# Patient Record
Sex: Female | Born: 1955
Health system: Southern US, Community
[De-identification: ages and names within clinical notes are randomized; demographics above are authoritative.]

## PROBLEM LIST (undated history)

## (undated) DIAGNOSIS — I10 Essential (primary) hypertension: Secondary | ICD-10-CM

## (undated) DIAGNOSIS — R200 Anesthesia of skin: Secondary | ICD-10-CM

## (undated) DIAGNOSIS — L03119 Cellulitis of unspecified part of limb: Secondary | ICD-10-CM

## (undated) DIAGNOSIS — L02619 Cutaneous abscess of unspecified foot: Secondary | ICD-10-CM

## (undated) DIAGNOSIS — E785 Hyperlipidemia, unspecified: Secondary | ICD-10-CM

## (undated) DIAGNOSIS — E1140 Type 2 diabetes mellitus with diabetic neuropathy, unspecified: Secondary | ICD-10-CM

## (undated) DIAGNOSIS — D508 Other iron deficiency anemias: Secondary | ICD-10-CM

## (undated) DIAGNOSIS — F319 Bipolar disorder, unspecified: Secondary | ICD-10-CM

## (undated) DIAGNOSIS — R202 Paresthesia of skin: Secondary | ICD-10-CM

## (undated) DIAGNOSIS — Z8719 Personal history of other diseases of the digestive system: Secondary | ICD-10-CM

## (undated) DIAGNOSIS — D631 Anemia in chronic kidney disease: Secondary | ICD-10-CM

## (undated) DIAGNOSIS — K909 Intestinal malabsorption, unspecified: Secondary | ICD-10-CM

## (undated) HISTORY — PX: COLON SURGERY: SHX602

## (undated) HISTORY — DX: Intestinal malabsorption, unspecified: K90.9

## (undated) HISTORY — PX: ADENOIDECTOMY: SUR15

## (undated) HISTORY — PX: ABDOMINAL HYSTERECTOMY: SHX81

## (undated) HISTORY — PX: CARPAL TUNNEL RELEASE: SHX101

## (undated) HISTORY — DX: Hyperlipidemia, unspecified: E78.5

## (undated) HISTORY — PX: BREAST SURGERY: SHX581

## (undated) HISTORY — PX: BLADDER SURGERY: SHX569

## (undated) HISTORY — PX: TONSILLECTOMY: SUR1361

## (undated) HISTORY — DX: Anemia in chronic kidney disease: D63.1

## (undated) HISTORY — DX: Other iron deficiency anemias: D50.8

---
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
## 1997-10-13 ENCOUNTER — Ambulatory Visit (HOSPITAL_COMMUNITY): Admission: RE | Admit: 1997-10-13 | Discharge: 1997-10-13 | Payer: Self-pay | Admitting: Pulmonary Disease

## 1997-10-20 ENCOUNTER — Ambulatory Visit: Admission: RE | Admit: 1997-10-20 | Discharge: 1997-10-20 | Payer: Self-pay | Admitting: Pulmonary Disease

## 1997-11-07 ENCOUNTER — Ambulatory Visit: Admission: RE | Admit: 1997-11-07 | Discharge: 1997-11-07 | Payer: Self-pay | Admitting: Pulmonary Disease

## 1998-01-12 ENCOUNTER — Ambulatory Visit (HOSPITAL_COMMUNITY): Admission: RE | Admit: 1998-01-12 | Discharge: 1998-01-12 | Payer: Self-pay | Admitting: Pulmonary Disease

## 1999-12-28 ENCOUNTER — Emergency Department (HOSPITAL_COMMUNITY): Admission: EM | Admit: 1999-12-28 | Discharge: 1999-12-28 | Payer: Self-pay | Admitting: Emergency Medicine

## 1999-12-29 ENCOUNTER — Encounter: Payer: Self-pay | Admitting: Emergency Medicine

## 2000-05-12 HISTORY — PX: OTHER SURGICAL HISTORY: SHX169

## 2011-10-23 ENCOUNTER — Inpatient Hospital Stay (HOSPITAL_COMMUNITY)
Admission: EM | Admit: 2011-10-23 | Discharge: 2011-10-28 | DRG: 504 | Disposition: A | Discharge status: Home / Self Care | Payer: Self-pay | Attending: Internal Medicine | Admitting: Internal Medicine

## 2011-10-23 ENCOUNTER — Emergency Department (HOSPITAL_COMMUNITY): Payer: Self-pay

## 2011-10-23 ENCOUNTER — Encounter (HOSPITAL_COMMUNITY): Payer: Self-pay | Admitting: *Deleted

## 2011-10-23 DIAGNOSIS — L03119 Cellulitis of unspecified part of limb: Secondary | ICD-10-CM | POA: Diagnosis present

## 2011-10-23 DIAGNOSIS — L0291 Cutaneous abscess, unspecified: Secondary | ICD-10-CM

## 2011-10-23 DIAGNOSIS — F319 Bipolar disorder, unspecified: Secondary | ICD-10-CM | POA: Diagnosis present

## 2011-10-23 DIAGNOSIS — Z79899 Other long term (current) drug therapy: Secondary | ICD-10-CM

## 2011-10-23 DIAGNOSIS — E119 Type 2 diabetes mellitus without complications: Secondary | ICD-10-CM | POA: Diagnosis present

## 2011-10-23 DIAGNOSIS — E1142 Type 2 diabetes mellitus with diabetic polyneuropathy: Secondary | ICD-10-CM | POA: Diagnosis present

## 2011-10-23 DIAGNOSIS — B9689 Other specified bacterial agents as the cause of diseases classified elsewhere: Secondary | ICD-10-CM | POA: Diagnosis present

## 2011-10-23 DIAGNOSIS — Z9889 Other specified postprocedural states: Secondary | ICD-10-CM

## 2011-10-23 DIAGNOSIS — D638 Anemia in other chronic diseases classified elsewhere: Secondary | ICD-10-CM | POA: Diagnosis present

## 2011-10-23 DIAGNOSIS — E871 Hypo-osmolality and hyponatremia: Secondary | ICD-10-CM | POA: Diagnosis present

## 2011-10-23 DIAGNOSIS — M869 Osteomyelitis, unspecified: Principal | ICD-10-CM | POA: Diagnosis present

## 2011-10-23 DIAGNOSIS — B952 Enterococcus as the cause of diseases classified elsewhere: Secondary | ICD-10-CM | POA: Diagnosis present

## 2011-10-23 DIAGNOSIS — Z91040 Latex allergy status: Secondary | ICD-10-CM

## 2011-10-23 DIAGNOSIS — Z9079 Acquired absence of other genital organ(s): Secondary | ICD-10-CM

## 2011-10-23 DIAGNOSIS — D509 Iron deficiency anemia, unspecified: Secondary | ICD-10-CM | POA: Diagnosis present

## 2011-10-23 DIAGNOSIS — L02619 Cutaneous abscess of unspecified foot: Secondary | ICD-10-CM | POA: Diagnosis present

## 2011-10-23 DIAGNOSIS — L03039 Cellulitis of unspecified toe: Secondary | ICD-10-CM | POA: Diagnosis present

## 2011-10-23 DIAGNOSIS — Z885 Allergy status to narcotic agent status: Secondary | ICD-10-CM

## 2011-10-23 HISTORY — DX: Essential (primary) hypertension: I10

## 2011-10-23 HISTORY — DX: Bipolar disorder, unspecified: F31.9

## 2011-10-23 LAB — CBC
HCT: 34.1 % — ABNORMAL LOW (ref 36.0–46.0)
Hemoglobin: 11.8 g/dL — ABNORMAL LOW (ref 12.0–15.0)
MCH: 28.8 pg (ref 26.0–34.0)
MCHC: 34.6 g/dL (ref 30.0–36.0)
MCV: 83.2 fL (ref 78.0–100.0)
Platelets: 432 10*3/uL — ABNORMAL HIGH (ref 150–400)
RBC: 4.1 MIL/uL (ref 3.87–5.11)
RDW: 12.4 % (ref 11.5–15.5)
WBC: 11.5 10*3/uL — ABNORMAL HIGH (ref 4.0–10.5)

## 2011-10-23 LAB — DIFFERENTIAL
Basophils Absolute: 0.1 10*3/uL (ref 0.0–0.1)
Basophils Relative: 1 % (ref 0–1)
Eosinophils Relative: 1 % (ref 0–5)
Monocytes Absolute: 1.2 10*3/uL — ABNORMAL HIGH (ref 0.1–1.0)

## 2011-10-23 LAB — BASIC METABOLIC PANEL
BUN: 10 mg/dL (ref 6–23)
Creatinine, Ser: 0.52 mg/dL (ref 0.50–1.10)
GFR calc Af Amer: >90 mL/min (ref >90)
GFR calc non Af Amer: >90 mL/min (ref >90)

## 2011-10-23 MED ORDER — PIPERACILLIN-TAZOBACTAM 4.5 G IVPB: 4.5000 g | Freq: Once | INTRAVENOUS | Status: DC

## 2011-10-23 MED ORDER — MORPHINE SULFATE 2 MG/ML IJ SOLN
2.0000 mg | Freq: Once | INTRAMUSCULAR | Status: AC
  Administered 2011-10-23: 2 mg via INTRAVENOUS
  Filled 2011-10-23: qty 1

## 2011-10-23 MED ORDER — PIPERACILLIN-TAZOBACTAM 3.375 G IVPB 30 MIN
3.3750 g | Freq: Once | INTRAVENOUS | Status: AC
  Administered 2011-10-23: 3.375 g via INTRAVENOUS
  Filled 2011-10-23: qty 50

## 2011-10-23 MED ORDER — SODIUM CHLORIDE 0.9 % IV SOLN
INTRAVENOUS | Status: AC
  Administered 2011-10-23: via INTRAVENOUS

## 2011-10-23 MED ORDER — VANCOMYCIN HCL IN DEXTROSE 1-5 GM/200ML-% IV SOLN
1000.0000 mg | Freq: Once | INTRAVENOUS | Status: AC
  Administered 2011-10-23: 1000 mg via INTRAVENOUS
  Filled 2011-10-23: qty 200

## 2011-10-23 NOTE — ED Notes (Signed)
To ED for further eval of right foot pain and infection. States she had a spider bite and was seen and treated at [facility]. Pt states she has been an outpt at [facility] for vancomycin. Pt states she is now vomiting. Foot is foul smelling.

## 2011-10-23 NOTE — ED Notes (Signed)
Pt. Resting and call light within reach.

## 2011-10-23 NOTE — ED Provider Notes (Signed)
History     CSN: [id number]  Arrival date & time 10/23/11  1813   None     Chief Complaint  Patient presents with  . Foot Pain    (Consider location/radiation/quality/duration/timing/severity/associated sxs/prior treatment) HPI  56 year old diabetic presenting today with a three-week history of right foot pain swelling. Small lesion on foot 3 weeks ago developed into cellulitis. Treated by several practitioners, Bactrim, vancomycin, and Keflex. Patient's symptoms gotten worse over the last 3 days and there is no foul odor. The redness and swelling have worsened. She went to a podiatrist today who has been following her she requested that she come to the emergency department. She denies any fever, she is apparently been vomiting. On arrival her vital signs were normal. Past Medical History  Diagnosis Date  . Diabetes mellitus   . Bipolar 1 disorder     Past Surgical History  Procedure Date  . Breast surgery   . Abdominal hysterectomy   . Colon surgery     History reviewed. No pertinent family history.  History  Substance Use Topics  . Smoking status: Never Smoker   . Smokeless tobacco: Not on file  . Alcohol Use: No    OB History    Grav Para Term Preterm Abortions TAB SAB Ect Mult Living                  Review of Systems Constitutional: Negative for fever and chills.  HENT: Negative for ear pain, sore throat and trouble swallowing.   Eyes: Negative for pain and visual disturbance.  Respiratory: Negative for cough and shortness of breath.   Cardiovascular: Negative for chest pain and leg swelling.  Gastrointestinal: Negative for nausea, POS vomiting, neg abdominal pain and diarrhea.  Genitourinary: Negative for dysuria, urgency and frequency.  Musculoskeletal: Negative for back pain and joint swelling.  Skin: Negative for rash and POS wound.  Neurological: Negative for dizziness, syncope, speech difficulty, weakness and numbness.   Allergies  Ativan;  Darvocet; Demerol; Latex; and Oxycodone  Home Medications   Current Outpatient Rx  Name Route Sig Dispense Refill  . LITHIUM CARBONATE 300 MG PO CAPS Oral Take 300-600 mg by mouth 3 (three) times daily with meals. Takes 2 in the am, 1 at dinner & 2 at night    . VENLAFAXINE HCL 37.5 MG PO TABS Oral Take 37.5 mg by mouth 2 (two) times daily.      BP 113/70  Pulse 97  Temp 99.6 F (37.6 C) (Oral)  Resp 20  SpO2 97%  Physical Exam Consitutional: Pt in no acute distress.   Head: Normocephalic and atraumatic.  Eyes: Extraocular motion intact, no scleral icterus Neck: Supple without meningismus, mass, or overt JVD Respiratory: Effort normal and breath sounds normal. No respiratory distress. CV: Heart regular rate and regular rhythm (sinus), no obvious murmurs.  Pulses +2 and symmetric Abdomen: Soft, non-tender, non-distended. No rebound or guarding.  MSK: Right foot moderate to severe edema. Significant erythema on the dorsal foot. Ulceration on anterior volarr foot.  Otherwise extremities are atraumatic without deformity, ROM intact. No crepitus noted Skin: Warm, dry, intact Neuro: Alert and oriented, no motor deficit noted.   Psychiatric: Mood and affect are normal    ED Course  Procedures (including critical care time)   Labs Reviewed  C-REACTIVE PROTEIN  CBC  DIFFERENTIAL  SEDIMENTATION RATE  BASIC METABOLIC PANEL   No results found.   No diagnosis found.    MDM  Diabetic not currently on insulin  secondary to monetary concerns presents with right foot cellulitis x3 weeks. Concern for osteomyelitis. We'll image and start antibiotics. Imaging and blood work suggest osteomyelitis. Vancomycin,  Zosyn started in the emergency department.  Patient remained hemodynamically stable.  X-ray mentions gas in foot, discussed with orthopedics.  Orthopedics will see patient in the morning.  Overall picture not consistent with necrotizing fasciitis.  Patient given pain medication as  well as fluid bolus. Admitted to medicine uneventfully.          Edwin Cap, MD 10/24/11 [phone]

## 2011-10-24 ENCOUNTER — Other Ambulatory Visit (HOSPITAL_COMMUNITY): Payer: Self-pay | Admitting: Orthopedic Surgery

## 2011-10-24 ENCOUNTER — Encounter (HOSPITAL_COMMUNITY): Payer: Self-pay | Admitting: Internal Medicine

## 2011-10-24 ENCOUNTER — Encounter (HOSPITAL_COMMUNITY)
Admission: EM | Disposition: A | Discharge status: Home / Self Care | Payer: Self-pay | Source: Home / Self Care | Attending: Internal Medicine

## 2011-10-24 ENCOUNTER — Inpatient Hospital Stay (HOSPITAL_COMMUNITY): Payer: Self-pay | Admitting: Certified Registered"

## 2011-10-24 ENCOUNTER — Encounter (HOSPITAL_COMMUNITY): Payer: Self-pay | Admitting: Certified Registered"

## 2011-10-24 ENCOUNTER — Inpatient Hospital Stay (HOSPITAL_COMMUNITY): Payer: Self-pay

## 2011-10-24 ENCOUNTER — Encounter (HOSPITAL_COMMUNITY): Payer: Self-pay | Admitting: *Deleted

## 2011-10-24 DIAGNOSIS — Z8659 Personal history of other mental and behavioral disorders: Secondary | ICD-10-CM

## 2011-10-24 DIAGNOSIS — E1165 Type 2 diabetes mellitus with hyperglycemia: Secondary | ICD-10-CM

## 2011-10-24 DIAGNOSIS — E1142 Type 2 diabetes mellitus with diabetic polyneuropathy: Secondary | ICD-10-CM | POA: Diagnosis present

## 2011-10-24 DIAGNOSIS — F319 Bipolar disorder, unspecified: Secondary | ICD-10-CM | POA: Diagnosis present

## 2011-10-24 DIAGNOSIS — L02419 Cutaneous abscess of limb, unspecified: Secondary | ICD-10-CM

## 2011-10-24 DIAGNOSIS — L03119 Cellulitis of unspecified part of limb: Secondary | ICD-10-CM | POA: Diagnosis present

## 2011-10-24 DIAGNOSIS — E119 Type 2 diabetes mellitus without complications: Secondary | ICD-10-CM | POA: Diagnosis present

## 2011-10-24 DIAGNOSIS — Z794 Long term (current) use of insulin: Secondary | ICD-10-CM | POA: Diagnosis present

## 2011-10-24 DIAGNOSIS — IMO0001 Reserved for inherently not codable concepts without codable children: Secondary | ICD-10-CM

## 2011-10-24 HISTORY — PX: AMPUTATION: SHX166

## 2011-10-24 LAB — DIFFERENTIAL
Eosinophils Absolute: 0.2 10*3/uL (ref 0.0–0.7)
Lymphocytes Relative: 18 % (ref 12–46)
Lymphs Abs: 1.5 10*3/uL (ref 0.7–4.0)
Monocytes Relative: 12 % (ref 3–12)
Neutro Abs: 5.6 10*3/uL (ref 1.7–7.7)
Neutrophils Relative %: 67 % (ref 43–77)

## 2011-10-24 LAB — GLUCOSE, CAPILLARY
Glucose-Capillary: 366 mg/dL — ABNORMAL HIGH (ref 70–99)
Glucose-Capillary: 289 mg/dL — ABNORMAL HIGH (ref 70–99)

## 2011-10-24 LAB — COMPREHENSIVE METABOLIC PANEL
AST: 11 U/L (ref 0–37)
BUN: 9 mg/dL (ref 6–23)
CO2: 27 mEq/L (ref 19–32)
Calcium: 8.8 mg/dL (ref 8.4–10.5)
Creatinine, Ser: 0.47 mg/dL — ABNORMAL LOW (ref 0.50–1.10)
GFR calc non Af Amer: >90 mL/min (ref >90)

## 2011-10-24 LAB — RETICULOCYTES
RBC.: 3.76 MIL/uL — ABNORMAL LOW (ref 3.87–5.11)
Retic Ct Pct: 1.8 % (ref 0.4–3.1)

## 2011-10-24 LAB — FERRITIN: Ferritin: 221 ng/mL (ref 10–291)

## 2011-10-24 LAB — CBC
Hemoglobin: 10.8 g/dL — ABNORMAL LOW (ref 12.0–15.0)
MCH: 28.6 pg (ref 26.0–34.0)
RBC: 3.78 MIL/uL — ABNORMAL LOW (ref 3.87–5.11)
WBC: 8.4 10*3/uL (ref 4.0–10.5)

## 2011-10-24 LAB — LITHIUM LEVEL: Lithium Lvl: <0.25 mEq/L — ABNORMAL LOW (ref 0.80–1.40)

## 2011-10-24 LAB — HEMOGLOBIN A1C: Mean Plasma Glucose: 292 mg/dL — ABNORMAL HIGH (ref <117)

## 2011-10-24 LAB — IRON AND TIBC
Saturation Ratios: 11 % — ABNORMAL LOW (ref 20–55)
TIBC: 222 ug/dL — ABNORMAL LOW (ref 250–470)

## 2011-10-24 LAB — VITAMIN B12: Vitamin B-12: 431 pg/mL (ref 211–911)

## 2011-10-24 LAB — C-REACTIVE PROTEIN: CRP: 21.99 mg/dL — ABNORMAL HIGH (ref <0.60)

## 2011-10-24 SURGERY — AMPUTATION, FOOT, RAY
Anesthesia: General | Site: Foot | Laterality: Right | Wound class: Dirty or Infected

## 2011-10-24 MED ORDER — HYDROMORPHONE HCL PF 1 MG/ML IJ SOLN
0.5000 mg | INTRAMUSCULAR | Status: DC | PRN
  Administered 2011-10-24: 0.5 mg via INTRAVENOUS
  Filled 2011-10-24: qty 1

## 2011-10-24 MED ORDER — LITHIUM CARBONATE 300 MG PO CAPS
300.0000 mg | ORAL_CAPSULE | Freq: Every day | ORAL | Status: DC
  Administered 2011-10-25 – 2011-10-27 (×3): 300 mg via ORAL
  Filled 2011-10-24 (×5): qty 1

## 2011-10-24 MED ORDER — SODIUM CHLORIDE 0.9 % IV SOLN
INTRAVENOUS | Status: DC
  Administered 2011-10-24: 10 mL/h via INTRAVENOUS

## 2011-10-24 MED ORDER — 0.9 % SODIUM CHLORIDE (POUR BTL) OPTIME
TOPICAL | Status: DC | PRN
  Administered 2011-10-24: 1000 mL

## 2011-10-24 MED ORDER — PIPERACILLIN-TAZOBACTAM 3.375 G IVPB
3.3750 g | Freq: Three times a day (TID) | INTRAVENOUS | Status: DC
  Administered 2011-10-24 – 2011-10-28 (×13): 3.375 g via INTRAVENOUS
  Filled 2011-10-24 (×18): qty 50

## 2011-10-24 MED ORDER — ONDANSETRON HCL 4 MG PO TABS
4.0000 mg | ORAL_TABLET | Freq: Four times a day (QID) | ORAL | Status: DC | PRN
  Filled 2011-10-24: qty 1

## 2011-10-24 MED ORDER — ONDANSETRON HCL 4 MG PO TABS: 4.0000 mg | ORAL_TABLET | Freq: Four times a day (QID) | ORAL | Status: DC | PRN

## 2011-10-24 MED ORDER — INSULIN ASPART 100 UNIT/ML ~~LOC~~ SOLN
0.0000 [IU] | Freq: Three times a day (TID) | SUBCUTANEOUS | Status: DC
  Administered 2011-10-24: 9 [IU] via SUBCUTANEOUS
  Administered 2011-10-24: 5 [IU] via SUBCUTANEOUS
  Administered 2011-10-25: 2 [IU] via SUBCUTANEOUS
  Administered 2011-10-25: 5 [IU] via SUBCUTANEOUS
  Administered 2011-10-25: 7 [IU] via SUBCUTANEOUS
  Administered 2011-10-26: 2 [IU] via SUBCUTANEOUS
  Administered 2011-10-26: 1 [IU] via SUBCUTANEOUS
  Administered 2011-10-26: 2 [IU] via SUBCUTANEOUS
  Administered 2011-10-27: 3 [IU] via SUBCUTANEOUS
  Administered 2011-10-27: 1 [IU] via SUBCUTANEOUS
  Administered 2011-10-27 – 2011-10-28 (×2): 2 [IU] via SUBCUTANEOUS
  Administered 2011-10-28: 3 [IU] via SUBCUTANEOUS

## 2011-10-24 MED ORDER — SODIUM CHLORIDE 0.9 % IV SOLN
INTRAVENOUS | Status: DC
  Administered 2011-10-24: 07:00:00 via INTRAVENOUS
  Administered 2011-10-25: 10 mL/h via INTRAVENOUS

## 2011-10-24 MED ORDER — METOCLOPRAMIDE HCL 10 MG PO TABS: 5.0000 mg | ORAL_TABLET | Freq: Three times a day (TID) | ORAL | Status: DC | PRN

## 2011-10-24 MED ORDER — GADOBENATE DIMEGLUMINE 529 MG/ML IV SOLN
15.0000 mL | Freq: Once | INTRAVENOUS | Status: AC
  Administered 2011-10-24: 15 mL via INTRAVENOUS

## 2011-10-24 MED ORDER — PHENYLEPHRINE HCL 10 MG/ML IJ SOLN
INTRAMUSCULAR | Status: DC | PRN
  Administered 2011-10-24: 80 ug via INTRAVENOUS

## 2011-10-24 MED ORDER — ACETAMINOPHEN 325 MG PO TABS
650.0000 mg | ORAL_TABLET | Freq: Four times a day (QID) | ORAL | Status: DC | PRN
  Administered 2011-10-25: 650 mg via ORAL
  Filled 2011-10-24: qty 2

## 2011-10-24 MED ORDER — HYDROMORPHONE HCL PF 1 MG/ML IJ SOLN: 0.2500 mg | INTRAMUSCULAR | Status: DC | PRN

## 2011-10-24 MED ORDER — ONDANSETRON HCL 4 MG/2ML IJ SOLN
4.0000 mg | Freq: Four times a day (QID) | INTRAMUSCULAR | Status: DC | PRN
  Administered 2011-10-25 – 2011-10-27 (×3): 4 mg via INTRAVENOUS
  Filled 2011-10-24 (×3): qty 2

## 2011-10-24 MED ORDER — VANCOMYCIN HCL IN DEXTROSE 1-5 GM/200ML-% IV SOLN
1000.0000 mg | Freq: Two times a day (BID) | INTRAVENOUS | Status: DC
  Administered 2011-10-24 – 2011-10-28 (×9): 1000 mg via INTRAVENOUS
  Filled 2011-10-24 (×10): qty 200

## 2011-10-24 MED ORDER — INSULIN GLARGINE 100 UNIT/ML ~~LOC~~ SOLN: 15.0000 [IU] | Freq: Every day | SUBCUTANEOUS | Status: DC

## 2011-10-24 MED ORDER — MIDAZOLAM HCL 5 MG/5ML IJ SOLN
INTRAMUSCULAR | Status: DC | PRN
  Administered 2011-10-24 (×2): 1 mg via INTRAVENOUS

## 2011-10-24 MED ORDER — LACTATED RINGERS IV SOLN
INTRAVENOUS | Status: DC | PRN
  Administered 2011-10-24: 19:00:00 via INTRAVENOUS

## 2011-10-24 MED ORDER — HYDROMORPHONE HCL PF 1 MG/ML IJ SOLN
0.5000 mg | INTRAMUSCULAR | Status: DC | PRN
  Administered 2011-10-24 – 2011-10-25 (×3): 1 mg via INTRAVENOUS
  Filled 2011-10-24 (×3): qty 1

## 2011-10-24 MED ORDER — LITHIUM CARBONATE 300 MG PO CAPS: 300.0000 mg | ORAL_CAPSULE | Freq: Three times a day (TID) | ORAL | Status: DC

## 2011-10-24 MED ORDER — ACETAMINOPHEN 650 MG RE SUPP: 650.0000 mg | Freq: Four times a day (QID) | RECTAL | Status: DC | PRN

## 2011-10-24 MED ORDER — METOCLOPRAMIDE HCL 5 MG/ML IJ SOLN: 5.0000 mg | Freq: Three times a day (TID) | INTRAMUSCULAR | Status: DC | PRN

## 2011-10-24 MED ORDER — FENTANYL CITRATE 0.05 MG/ML IJ SOLN
INTRAMUSCULAR | Status: DC | PRN
  Administered 2011-10-24 (×2): 50 ug via INTRAVENOUS
  Administered 2011-10-24: 100 ug via INTRAVENOUS

## 2011-10-24 MED ORDER — LITHIUM CARBONATE 300 MG PO CAPS
600.0000 mg | ORAL_CAPSULE | Freq: Two times a day (BID) | ORAL | Status: DC
  Administered 2011-10-24 – 2011-10-28 (×9): 600 mg via ORAL
  Filled 2011-10-24 (×11): qty 2

## 2011-10-24 MED ORDER — ENOXAPARIN SODIUM 40 MG/0.4ML ~~LOC~~ SOLN
40.0000 mg | SUBCUTANEOUS | Status: DC
  Administered 2011-10-25 – 2011-10-28 (×4): 40 mg via SUBCUTANEOUS
  Filled 2011-10-24 (×5): qty 0.4

## 2011-10-24 MED ORDER — PROMETHAZINE HCL 25 MG/ML IJ SOLN
12.5000 mg | Freq: Four times a day (QID) | INTRAMUSCULAR | Status: DC | PRN
  Administered 2011-10-24: 12.5 mg via INTRAVENOUS
  Filled 2011-10-24: qty 1

## 2011-10-24 MED ORDER — POTASSIUM CHLORIDE 10 MEQ/100ML IV SOLN
10.0000 meq | INTRAVENOUS | Status: AC
  Administered 2011-10-24 (×2): 10 meq via INTRAVENOUS
  Filled 2011-10-24 (×2): qty 100

## 2011-10-24 MED ORDER — ONDANSETRON HCL 4 MG/2ML IJ SOLN: 4.0000 mg | Freq: Once | INTRAMUSCULAR | Status: DC | PRN

## 2011-10-24 MED ORDER — ONDANSETRON HCL 4 MG/2ML IJ SOLN
4.0000 mg | Freq: Four times a day (QID) | INTRAMUSCULAR | Status: DC | PRN
  Administered 2011-10-24: 4 mg via INTRAVENOUS
  Filled 2011-10-24: qty 2

## 2011-10-24 MED ORDER — VENLAFAXINE HCL 37.5 MG PO TABS
37.5000 mg | ORAL_TABLET | Freq: Two times a day (BID) | ORAL | Status: DC
  Administered 2011-10-24 – 2011-10-28 (×9): 37.5 mg via ORAL
  Filled 2011-10-24 (×10): qty 1

## 2011-10-24 SURGICAL SUPPLY — 44 items
BANDAGE ESMARK 6X9 LF (GAUZE/BANDAGES/DRESSINGS) IMPLANT
BANDAGE GAUZE ELAST BULKY 4 IN (GAUZE/BANDAGES/DRESSINGS) ×1 IMPLANT
BLADE SAW SGTL MED 73X18.5 STR (BLADE) IMPLANT
BNDG CMPR 9X6 STRL LF SNTH (GAUZE/BANDAGES/DRESSINGS)
BNDG COHESIVE 4X5 TAN STRL (GAUZE/BANDAGES/DRESSINGS) ×2 IMPLANT
BNDG COHESIVE 6X5 TAN STRL LF (GAUZE/BANDAGES/DRESSINGS) ×2 IMPLANT
BNDG ESMARK 6X9 LF (GAUZE/BANDAGES/DRESSINGS)
CLOTH BEACON ORANGE TIMEOUT ST (SAFETY) ×2 IMPLANT
CUFF TOURNIQUET SINGLE 34IN LL (TOURNIQUET CUFF) IMPLANT
CUFF TOURNIQUET SINGLE 44IN (TOURNIQUET CUFF) IMPLANT
DRAPE U-SHAPE 47X51 STRL (DRAPES) ×4 IMPLANT
DRSG ADAPTIC 3X8 NADH LF (GAUZE/BANDAGES/DRESSINGS) ×2 IMPLANT
DRSG PAD ABDOMINAL 8X10 ST (GAUZE/BANDAGES/DRESSINGS) ×2 IMPLANT
DURAPREP 26ML APPLICATOR (WOUND CARE) ×2 IMPLANT
ELECT REM PT RETURN 9FT ADLT (ELECTROSURGICAL) ×2
ELECTRODE REM PT RTRN 9FT ADLT (ELECTROSURGICAL) ×1 IMPLANT
GLOVE BIOGEL PI IND STRL 7.5 (GLOVE) IMPLANT
GLOVE BIOGEL PI IND STRL 9 (GLOVE) ×1 IMPLANT
GLOVE BIOGEL PI INDICATOR 7.5 (GLOVE) ×3
GLOVE BIOGEL PI INDICATOR 9 (GLOVE) ×1
GLOVE NEODERM STER SZ 7 (GLOVE) ×1 IMPLANT
GLOVE SURG ORTHO 9.0 STRL STRW (GLOVE) ×2 IMPLANT
GOWN PREVENTION PLUS XLARGE (GOWN DISPOSABLE) ×4 IMPLANT
GOWN SRG XL XLNG 56XLVL 4 (GOWN DISPOSABLE) ×1 IMPLANT
GOWN STRL NON-REIN XL XLG LVL4 (GOWN DISPOSABLE) ×2
KIT BASIN OR (CUSTOM PROCEDURE TRAY) ×2 IMPLANT
KIT ROOM TURNOVER OR (KITS) ×2 IMPLANT
MANIFOLD NEPTUNE II (INSTRUMENTS) ×1 IMPLANT
NS IRRIG 1000ML POUR BTL (IV SOLUTION) ×2 IMPLANT
PACK ORTHO EXTREMITY (CUSTOM PROCEDURE TRAY) ×2 IMPLANT
PAD ARMBOARD 7.5X6 YLW CONV (MISCELLANEOUS) ×4 IMPLANT
PAD CAST 4YDX4 CTTN HI CHSV (CAST SUPPLIES) ×1 IMPLANT
PADDING CAST COTTON 4X4 STRL (CAST SUPPLIES) ×2
SPONGE GAUZE 4X4 12PLY (GAUZE/BANDAGES/DRESSINGS) ×2 IMPLANT
SPONGE LAP 18X18 X RAY DECT (DISPOSABLE) ×2 IMPLANT
STAPLER VISISTAT 35W (STAPLE) ×1 IMPLANT
STOCKINETTE IMPERVIOUS LG (DRAPES) ×1 IMPLANT
SUCTION FRAZIER TIP 10 FR DISP (SUCTIONS) ×2 IMPLANT
SUT ETHILON 2 0 PSLX (SUTURE) ×4 IMPLANT
TOWEL OR 17X24 6PK STRL BLUE (TOWEL DISPOSABLE) ×2 IMPLANT
TOWEL OR 17X26 10 PK STRL BLUE (TOWEL DISPOSABLE) ×2 IMPLANT
TUBE CONNECTING 12X1/4 (SUCTIONS) ×1 IMPLANT
UNDERPAD 30X30 INCONTINENT (UNDERPADS AND DIAPERS) ×2 IMPLANT
WATER STERILE IRR 1000ML POUR (IV SOLUTION) ×1 IMPLANT

## 2011-10-24 NOTE — Progress Notes (Signed)
Subjective: No chest pain, SOB.  Feeling ok.  Objective: Filed Vitals:   10/23/11 2014 10/24/11 0026 10/24/11 0100 10/24/11 0600  BP: 96/66 103/66  94/62  Pulse:  83  86  Temp: 98.3 F (36.8 C) 98.3 F (36.8 C)  98.7 F (37.1 C)  TempSrc: Oral Oral  Oral  Resp:  18  16  Height:   5\' 1"  (1.549 m)   Weight:   75 kg (165 lb 5.5 oz)   SpO2:  98%  98%   Weight change:    General: Alert, awake, oriented x3, in no acute distress.  HEENT: No bruits, no goiter.  Heart: Regular rate and rhythm, without murmurs, rubs, gallops.  Lungs: Crackles left side, bilateral air movement.  Abdomen: Soft, nontender, nondistended, positive bowel sounds.  Neuro: Grossly intact, nonfocal. Extremities: Right foot with swelling, redness.   Lab Results:  [facility] 10/24/11 0630 10/23/11 2019  NA 131* 132*  K 3.4* 4.6  CL 93* 94*  CO2 27 28  GLUCOSE 322* 326*  BUN 9 10  CREATININE 0.47* 0.52  CALCIUM 8.8 9.4  MG -- --  PHOS -- --    Basename 10/24/11 0630  AST 11  ALT 12  ALKPHOS 129*  BILITOT 0.3  PROT 7.0  ALBUMIN 2.5*    Basename 10/24/11 0630 10/23/11 2019  WBC 8.4 11.5*  NEUTROABS 5.6 8.2*  HGB 10.8* 11.8*  HCT 31.7* 34.1*  MCV 83.9 83.2  PLT 405* 432*   Basename 10/24/11 0630  VITAMINB12 --  FOLATE --  FERRITIN --  TIBC --  IRON --  RETICCTPCT 1.8    Micro Results: No results found for this or any previous visit (from the past 240 hour(s)).  Studies/Results: Dg Foot 2 Views Right  10/23/2011  *RADIOLOGY REPORT*  Clinical Data: Foot pain  RIGHT FOOT - 2 VIEW  Comparison: Prior films same day and 10/01/2011  Findings: Two views of the right foot submitted.  There is soft tissue swelling and soft tissue air at the base of proximal phalanx and metatarsal phalangeal joint third and fourth toe.  There is cortical irregularity at the base of proximal phalanx third toe. Findings are suspicious for cellulitis and early osteomyelitis.  I cannot exclude a pathologic fracture.   IMPRESSION:  There is soft tissue swelling and soft tissue air at the base of proximal phalanx and metatarsal phalangeal joint third and fourth toe.  There is cortical irregularity at the base of proximal phalanx third toe.  Findings are suspicious for cellulitis and early osteomyelitis.  I cannot exclude a pathologic fracture.  Original Report Authenticated By: Lahoma Crocker, M.D.    Medications: I have reviewed the patient's current medications.  1. Cellulitis of the right foot with a possible developing osteomyelitis -Continue with vancomycin and Zosyn. For surgery today by Dr Sharol Given. MRI was done of right foot.  2. Diabetes mellitus2 - patient has been off her Lantus for last one month due to insurance issues. I have placed patient on Lantus 15 units in the morning with sliding-scale coverage.  3. Bipolar disorder - continue present medications. 4. Normocytic normochromic: Monitor hb.      LOS: 1 day   Alaila Pillard M.D.  [facility] 10/24/2011, 9:27 AM

## 2011-10-24 NOTE — Op Note (Signed)
OPERATIVE REPORT  DATE OF SURGERY: 10/24/2011  PATIENT:  Dana [name],  56 y.o. female  PRE-OPERATIVE DIAGNOSIS:  osteomyelitis Right Foot 3rd Toe  POST-OPERATIVE DIAGNOSIS:  Osteomyelitis Right Foot, Third Toe, second metatarsal, large abscess  PROCEDURE:  Procedure(s): AMPUTATION RAY Amputation second and third ray right foot  SURGEON:  Surgeon(s): Newt Minion, MD  ANESTHESIA:   regional  EBL:  Minimal ML  SPECIMEN:  Source of Specimen:  Right foot second and third ray  TOURNIQUET:  * No tourniquets in log *  PROCEDURE DETAILS: Patient is a 56 year old woman with acute abscess swelling of the right foot. Radiograph shows osteomyelitis of the second metatarsal and second toe MRI scan shows a large abscess throughout the midfoot. Patient was started on IV bank and Zosyn and presents at this time for surgical debridement for limb salvage surgery. Risks and benefits were discussed including persistent infection neurovascular injury nonhealing of the wound need for additional surgery. Patient states she understands and wished to proceed at this time. Should procedure patient was brought to the OR and underwent an ankle block. After adequate levels of anesthesia were obtained patient's right lower extremity was prepped using DuraPrep and draped into a sterile field. An elliptical incision was made dorsally this was carried down to the large abscess aerobic and anaerobic cultures were obtained the wound was irrigated with normal saline the third metatarsal and third toe were resected in one block of tissue the second metatarsal was also resected as part of the deep abscess. The wound was further irrigated hemostasis was obtained the dorsal incision was closed using 2-0 nylon there was no tension the skin. The wound is covered with Adaptic orthopedic sponges AB dressing Kerlix and Coban. Patient was then taken to the PACU in stable condition.  PLAN OF CARE: Admit to inpatient   PATIENT  DISPOSITION:  PACU - hemodynamically stable.   Newt Minion, MD 10/24/2011 7:27 PM

## 2011-10-24 NOTE — Progress Notes (Signed)
Inpatient Diabetes Program Recommendations  AACE/ADA: New Consensus Statement on Inpatient Glycemic Control (2009)  Target Ranges:  Prepandial:   less than 140 mg/dL      Peak postprandial:   less than 180 mg/dL (1-2 hours)      Critically ill patients:  140 - 180 mg/dL   Reason for Visit: Note patient admitted with cellulitis.  She was on Lantus in the past but has been unable to afford over past month.  Her A1C=11.8% indicating poor glycemic control.  She plans to follow-up at [facility] in July, which may help her with her insulin costs.   May consider cheaper insulin option such as Novolin 70/30 (this is 24.88 at [facility]).  Based on A1C, it appears she need bid dosing with breakfast and supper.  Once patient is closer to discharge, may consider transition to 70/30 and discontinuation of Lantus.  Could start with 12 units 70/30 with breakfast and 12 units 70/30 with supper. Will likely need titration.  Also will need to give patient information regarding generic meter due to cost.  Will follow.

## 2011-10-24 NOTE — Preoperative (Signed)
Beta Blockers   Reason not to administer Beta Blockers:Not Applicable 

## 2011-10-24 NOTE — Anesthesia Procedure Notes (Signed)
Anesthesia Regional Block:  Ankle block  Pre-Anesthetic Checklist: ,, timeout performed, Correct Patient, Correct Site, Correct Laterality, Correct Procedure, Correct Position, site marked, Risks and benefits discussed,  Surgical consent,  Pre-op evaluation,  At surgeon's request and post-op pain management  Laterality: Right  Prep: Maximum Sterile Barrier Precautions used, chloraprep and alcohol swabs       Needles:  Injection technique: Single-shot      Needle Gauge: 25 and 25 G  Needle insertion depth: 4 cm   Additional Needles: Ankle block Narrative:  Start time: 10/24/2011 6:20 PM End time: 10/24/2011 6:25 PM Injection made incrementally with aspirations every 5 mL.  Performed by: Personally  Anesthesiologist: Sharolyn Douglas MD  Additional Notes: 20cc 2% Lidocaine and 15cc 0.5%Marcaine

## 2011-10-24 NOTE — H&P (Signed)
Dana [name] is an 56 y.o. female.   PCP - None. Chief Complaint: Worsening swelling of the right foot. HPI: 56 year-old female with known history of diabetes mellitus who is not on medications for last one month due to financial issues and history of bipolar disorder started noticing some insect bite-like lesions on the plantar aspect of her right foot on May 20 4 weeks ago. This slowly worsening to swelling of the distal aspect of the foot and she had gone to [facility] and was admitted for cellulitis. Patient on IV vancomycin for almost a week and was discharged on Bactrim and Keflex. Patient at the oral antibiotics for another one week. It's been since a week since she took the last dose of antibiotic. The swelling worsened over the week and also noticed some discharge. She had gone to a podiatrist yesterday and was referred to the ER. In the ER at [facility] patient had x-ray done of the right foot which also shows features possibility of early osteomyelitis. There is also air in the soft tissues. Orthopedic on call was consulted by the ER physician. They'll be seeing patient in consult and hospitalist has been requested to admit.  Past Medical History  Diagnosis Date  . Diabetes mellitus   . Bipolar 1 disorder     Past Surgical History  Procedure Date  . Breast surgery   . Abdominal hysterectomy   . Colon surgery     History reviewed. No pertinent family history. Social History:  reports that she has never smoked. She does not have any smokeless tobacco history on file. She reports that she does not drink alcohol. Her drug history not on file.  Allergies:  Allergies  Allergen Reactions  . Ativan (Lorazepam) Other (See Comments)    Abnormal behavior  . Darvocet (Propoxyphene-Acetaminophen) Itching  . Demerol (Meperidine) Other (See Comments)    Abnormal behavior  . Latex Itching  . Oxycodone Other (See Comments)    Abnormal behavior    Medications Prior to Admission    Medication Sig Dispense Refill  . lithium carbonate 300 MG capsule Take 300-600 mg by mouth 3 (three) times daily with meals. Takes 2 in the am, 1 at dinner & 2 at night      . venlafaxine (EFFEXOR) 37.5 MG tablet Take 37.5 mg by mouth 2 (two) times daily.        Results for orders placed during the hospital encounter of 10/23/11 (from the past 48 hour(s))  CBC     Status: Abnormal   Collection Time   10/23/11  8:19 PM      Component Value Range Comment   WBC 11.5 (*) 4.0 - 10.5 K/uL    RBC 4.10  3.87 - 5.11 MIL/uL    Hemoglobin 11.8 (*) 12.0 - 15.0 g/dL    HCT 34.1 (*) 36.0 - 46.0 %    MCV 83.2  78.0 - 100.0 fL    MCH 28.8  26.0 - 34.0 pg    MCHC 34.6  30.0 - 36.0 g/dL    RDW 12.4  11.5 - 15.5 %    Platelets 432 (*) 150 - 400 K/uL   DIFFERENTIAL     Status: Abnormal   Collection Time   10/23/11  8:19 PM      Component Value Range Comment   Neutrophils Relative 72  43 - 77 %    Neutro Abs 8.2 (*) 1.7 - 7.7 K/uL    Lymphocytes Relative 17  12 -  46 %    Lymphs Abs 1.9  0.7 - 4.0 K/uL    Monocytes Relative 10  3 - 12 %    Monocytes Absolute 1.2 (*) 0.1 - 1.0 K/uL    Eosinophils Relative 1  0 - 5 %    Eosinophils Absolute 0.1  0.0 - 0.7 K/uL    Basophils Relative 1  0 - 1 %    Basophils Absolute 0.1  0.0 - 0.1 K/uL   SEDIMENTATION RATE     Status: Abnormal   Collection Time   10/23/11  8:19 PM      Component Value Range Comment   Sed Rate 120 (*) 0 - 22 mm/hr   BASIC METABOLIC PANEL     Status: Abnormal   Collection Time   10/23/11  8:19 PM      Component Value Range Comment   Sodium 132 (*) 135 - 145 mEq/L    Potassium 4.6  3.5 - 5.1 mEq/L    Chloride 94 (*) 96 - 112 mEq/L    CO2 28  19 - 32 mEq/L    Glucose, Bld 326 (*) 70 - 99 mg/dL    BUN 10  6 - 23 mg/dL    Creatinine, Ser 0.52  0.50 - 1.10 mg/dL    Calcium 9.4  8.4 - 10.5 mg/dL    GFR calc non Af Amer >90  >90 mL/min    GFR calc Af Amer >90  >90 mL/min    Dg Foot 2 Views Right  10/23/2011  *RADIOLOGY REPORT*   Clinical Data: Foot pain  RIGHT FOOT - 2 VIEW  Comparison: Prior films same day and 10/01/2011  Findings: Two views of the right foot submitted.  There is soft tissue swelling and soft tissue air at the base of proximal phalanx and metatarsal phalangeal joint third and fourth toe.  There is cortical irregularity at the base of proximal phalanx third toe. Findings are suspicious for cellulitis and early osteomyelitis.  I cannot exclude a pathologic fracture.  IMPRESSION:  There is soft tissue swelling and soft tissue air at the base of proximal phalanx and metatarsal phalangeal joint third and fourth toe.  There is cortical irregularity at the base of proximal phalanx third toe.  Findings are suspicious for cellulitis and early osteomyelitis.  I cannot exclude a pathologic fracture.  Original Report Authenticated By: Lahoma Crocker, M.D.    Review of Systems  Constitutional: Negative.   HENT: Negative.   Eyes: Negative.   Respiratory: Negative.   Cardiovascular: Negative.   Gastrointestinal: Negative.   Genitourinary: Negative.   Musculoskeletal:       Swelling and discharge of the right foot distal aspect.  Skin: Negative.   Neurological: Negative.   Endo/Heme/Allergies: Negative.   Psychiatric/Behavioral: Negative.     Blood pressure 96/66, pulse 97, temperature 98.3 F (36.8 C), temperature source Oral, resp. rate 20, SpO2 97.00%. Physical Exam  Constitutional: She is oriented to person, place, and time. She appears well-developed and well-nourished. No distress.  HENT:  Head: Normocephalic and atraumatic.  Right Ear: External ear normal.  Left Ear: External ear normal.  Nose: Nose normal.  Mouth/Throat: Oropharynx is clear and moist. No oropharyngeal exudate.  Eyes: Conjunctivae are normal. Pupils are equal, round, and reactive to light. Right eye exhibits no discharge. Left eye exhibits no discharge. No scleral icterus.  Neck: Normal range of motion. Neck supple.  Cardiovascular: Normal  rate and regular rhythm.   Respiratory: Effort normal and breath sounds normal.  No respiratory distress. She has no wheezes. She has no rales.  GI: Soft. Bowel sounds are normal. She exhibits no distension. There is no tenderness. There is no rebound.  Musculoskeletal:       Swelling distal aspect of right foot with erythema and shin sloughing in the plantar aspect.  Neurological: She is alert and oriented to person, place, and time.       Moves all extremities.  Skin: She is not diaphoretic.  Psychiatric: Her behavior is normal.     Assessment/Plan #1. Cellulitis of the right foot with a possible developing osteomyelitis - patient has been started on vancomycin and Zosyn which we will continue. Get MRI of the right foot. Orthopedic consult. #2. Diabetes mellitus2 - patient has been off her Lantus for last one month due to insurance issues. I have placed patient on Lantus 15 units in the morning with sliding-scale coverage. #3. Bipolar disorder - continue present medications. Check lithium levels. #4. Normocytic normochromic anemia - follow CBC. Check anemia panel.  CODE STATUS - full code.  Mahesh Sizemore N. 10/24/2011, 12:36 AM

## 2011-10-24 NOTE — ED Notes (Signed)
Admitting MD in to see the patient, will transport to floor afterwards

## 2011-10-24 NOTE — Care Management Note (Signed)
    Page 1 of 1   10/24/2011     2:55:10 PM   CARE MANAGEMENT NOTE 10/24/2011  Patient:  Dana [name], Dana [name]   Account Number:  [id number]  Date Initiated:  10/24/2011  Documentation initiated by:  Rozanna Boer  Subjective/Objective Assessment:   Admitted w/Osteomyelitis left foot  possible surgery pending  self pay, difficulty obtaining insulin     Action/Plan:   left MD message regarding Pharmacare  speak w/patient regarding [employer] 4$ list for Lithium  needs PCP   Anticipated DC Date:  10/27/2011   Anticipated DC Plan:  HOME/SELF CARE  In-house referral  [place]  Medication Assistance      Choice offered to / List presented to:             Status of service:  In process, will continue to follow Medicare Important Message given?   (If response is "NO", the following Medicare IM given date fields will be blank) Date Medicare IM given:   Date Additional Medicare IM given:    Discharge Disposition:    Per UR Regulation:  Reviewed for med. necessity/level of care/duration of stay  If discussed at [place] of Stay Meetings, dates discussed:    Comments:  10/24/11  14:39  Rozanna Boer RN/CM spoke with patient regarding medication assistance for Insulin, offer assistance through [place] patient aware that Lithium is on [facility] 4$ list Patient is aware of [facility], encouraged patient to contact [facility] to get earliest appt.

## 2011-10-24 NOTE — Transfer of Care (Signed)
Immediate Anesthesia Transfer of Care Note  Patient: Dana [name]  Procedure(s) Performed: Procedure(s) (LRB): AMPUTATION RAY (Right)  Patient Location: PACU  Anesthesia Type: MAC  Level of Consciousness: awake, alert  and oriented  Airway & Oxygen Therapy: Patient Spontanous Breathing and Patient connected to nasal cannula oxygen  Post-op Assessment: Report given to PACU RN and Post -op Vital signs reviewed and stable  Post vital signs: Reviewed and stable  Complications: No apparent anesthesia complications

## 2011-10-24 NOTE — Progress Notes (Signed)
ANTIBIOTIC CONSULT NOTE - INITIAL  Pharmacy Consult for Vancomycin, Zosyn Indication: Cellulitis  Allergies  Allergen Reactions  . Ativan (Lorazepam) Other (See Comments)    Abnormal behavior  . Darvocet (Propoxyphene-Acetaminophen) Itching  . Demerol (Meperidine) Other (See Comments)    Abnormal behavior  . Latex Itching  . Oxycodone Other (See Comments)    Abnormal behavior    Patient Measurements:   Per RN, 5'1" and 75 kg  Vital Signs: Temp: 98.3 F (36.8 C) (06/13 2014) Temp src: Oral (06/13 2014) BP: 96/66 mmHg (06/13 2014) Pulse Rate: 97  (06/13 1821) Intake/Output from previous day:   Intake/Output from this shift:    Labs:  Basename 10/23/11 2019  WBC 11.5*  HGB 11.8*  PLT 432*  LABCREA --  CREATININE 0.52   CrCl is unknown because there is no height on file for the current visit. No results found for this basename: VANCOTROUGH:2,VANCOPEAK:2,VANCORANDOM:2,GENTTROUGH:2,GENTPEAK:2,GENTRANDOM:2,TOBRATROUGH:2,TOBRAPEAK:2,TOBRARND:2,AMIKACINPEAK:2,AMIKACINTROU:2,AMIKACIN:2, in the last 72 hours   Microbiology: No results found for this or any previous visit (from the past 720 hour(s)).  Medical History: Past Medical History  Diagnosis Date  . Diabetes mellitus   . Bipolar 1 disorder     Medications:  Scheduled:    . sodium chloride   Intravenous STAT  . enoxaparin  40 mg Subcutaneous Q24H  . insulin aspart  0-9 Units Subcutaneous TID WC  . insulin glargine  15 Units Subcutaneous Daily  . lithium carbonate  300 mg Oral Q supper  . lithium carbonate  600 mg Oral BID  .  morphine injection  2 mg Intravenous Once  . piperacillin-tazobactam  3.375 g Intravenous Once  . vancomycin  1,000 mg Intravenous Once  . venlafaxine  37.5 mg Oral BID  . DISCONTD: lithium carbonate  300-600 mg Oral TID WC  . DISCONTD: piperacillin-tazobactam (ZOSYN)  IV  4.5 g Intravenous Once   Assessment: 56 yo female admitted with cellulitis of right foot and possible  osteomyelitis. Pharmacy consulted to manage vancomycin and Zosyn.   Goal of Therapy:  Vancomycin trough 10-20 mcg/mL   Plan:  1. Zosyn 3.375gm IV Q8H (4 hr infusion).  2. Vancomycin 1 gm IV Q12H.  Otila Back 10/24/2011,1:48 AM

## 2011-10-24 NOTE — H&P (Signed)
  Patient presents with abscess cellulitis right foot with osteomyelitis of the third metatarsal and third toe. MRI scan shows a large abscess. Discussed with the patient surgical versus nonsurgical options. Patient agrees to proceed with surgery. We will plan for a third ray amputation and debridement of the abscess. I discussed that additional surgery may be necessary depending on the extent of the abscess. We will plan for the third ray amputation at this time risks and benefits were discussed including infection neurovascular injury persistent infection need for additional surgery. Patient states she understands and wished to proceed at this time.

## 2011-10-24 NOTE — Anesthesia Postprocedure Evaluation (Signed)
  Anesthesia Post-op Note  Patient: Dana [name]  Procedure(s) Performed: Procedure(s) (LRB): AMPUTATION RAY (Right)  Patient Location: PACU  Anesthesia Type: Regional  Level of Consciousness: awake, alert , oriented and patient cooperative  Airway and Oxygen Therapy: Patient Spontanous Breathing and Patient connected to nasal cannula oxygen  Post-op Pain: none  Post-op Assessment: Post-op Vital signs reviewed, Patient's Cardiovascular Status Stable, Respiratory Function Stable, Patent Airway, No signs of Nausea or vomiting and Pain level controlled  Post-op Vital Signs: stable  Complications: No apparent anesthesia complications

## 2011-10-24 NOTE — Anesthesia Preprocedure Evaluation (Addendum)
Anesthesia Evaluation  Patient identified by MRN, date of birth, ID band Patient awake    Reviewed: Allergy & Precautions, H&P , NPO status , Patient's Chart, lab work & pertinent test results  History of Anesthesia Complications Negative for: history of anesthetic complications  Airway Mallampati: II TM Distance: >3 FB Neck ROM: full    Dental  (+) Poor Dentition   Pulmonary          Cardiovascular hypertension, Rhythm:regular Rate:Normal     Neuro/Psych PSYCHIATRIC DISORDERS    GI/Hepatic   Endo/Other  Diabetes mellitus-, Type 2, Insulin Dependent  Renal/GU      Musculoskeletal   Abdominal   Peds  Hematology   Anesthesia Other Findings   Reproductive/Obstetrics                          Anesthesia Physical Anesthesia Plan  ASA: III  Anesthesia Plan: MAC   Post-op Pain Management:    Induction: Intravenous  Airway Management Planned: Mask and LMA  Additional Equipment:   Intra-op Plan:   Post-operative Plan:   Informed Consent: I have reviewed the patients History and Physical, chart, labs and discussed the procedure including the risks, benefits and alternatives for the proposed anesthesia with the patient or authorized representative who has indicated his/her understanding and acceptance.     Plan Discussed with: CRNA, Anesthesiologist and Surgeon  Anesthesia Plan Comments:         Anesthesia Quick Evaluation

## 2011-10-25 DIAGNOSIS — L02419 Cutaneous abscess of limb, unspecified: Secondary | ICD-10-CM

## 2011-10-25 DIAGNOSIS — E1165 Type 2 diabetes mellitus with hyperglycemia: Secondary | ICD-10-CM

## 2011-10-25 DIAGNOSIS — IMO0001 Reserved for inherently not codable concepts without codable children: Secondary | ICD-10-CM

## 2011-10-25 DIAGNOSIS — L03119 Cellulitis of unspecified part of limb: Secondary | ICD-10-CM

## 2011-10-25 DIAGNOSIS — Z8659 Personal history of other mental and behavioral disorders: Secondary | ICD-10-CM

## 2011-10-25 LAB — BASIC METABOLIC PANEL
BUN: 8 mg/dL (ref 6–23)
Creatinine, Ser: 0.52 mg/dL (ref 0.50–1.10)
GFR calc Af Amer: >90 mL/min (ref >90)
GFR calc non Af Amer: >90 mL/min (ref >90)
Glucose, Bld: 262 mg/dL — ABNORMAL HIGH (ref 70–99)

## 2011-10-25 LAB — CBC
HCT: 31.4 % — ABNORMAL LOW (ref 36.0–46.0)
Hemoglobin: 10.6 g/dL — ABNORMAL LOW (ref 12.0–15.0)
MCHC: 33.8 g/dL (ref 30.0–36.0)
MCV: 84 fL (ref 78.0–100.0)
RDW: 12.3 % (ref 11.5–15.5)

## 2011-10-25 LAB — GLUCOSE, CAPILLARY: Glucose-Capillary: 215 mg/dL — ABNORMAL HIGH (ref 70–99)

## 2011-10-25 MED ORDER — POLYETHYLENE GLYCOL 3350 17 G PO PACK
17.0000 g | PACK | Freq: Two times a day (BID) | ORAL | Status: DC
  Administered 2011-10-25: 17 g via ORAL
  Filled 2011-10-25 (×3): qty 1

## 2011-10-25 MED ORDER — INSULIN GLARGINE 100 UNIT/ML ~~LOC~~ SOLN
15.0000 [IU] | Freq: Every day | SUBCUTANEOUS | Status: DC
  Administered 2011-10-25 – 2011-10-28 (×4): 15 [IU] via SUBCUTANEOUS

## 2011-10-25 MED ORDER — INSULIN GLARGINE 100 UNIT/ML ~~LOC~~ SOLN: 20.0000 [IU] | Freq: Every day | SUBCUTANEOUS | Status: DC

## 2011-10-25 MED ORDER — HYDROMORPHONE HCL PF 1 MG/ML IJ SOLN
0.5000 mg | INTRAMUSCULAR | Status: DC | PRN
  Administered 2011-10-25 – 2011-10-26 (×4): 1 mg via INTRAVENOUS
  Filled 2011-10-25 (×5): qty 1

## 2011-10-25 MED ORDER — TRAMADOL HCL 50 MG PO TABS
50.0000 mg | ORAL_TABLET | Freq: Three times a day (TID) | ORAL | Status: DC | PRN
  Administered 2011-10-25 – 2011-10-26 (×2): 50 mg via ORAL
  Filled 2011-10-25 (×2): qty 1

## 2011-10-25 NOTE — Progress Notes (Signed)
Subjective: 1 Day Post-Op Procedure(s) (LRB): AMPUTATION RAY (Right) 1 day following right second and third ray amputation right foot. Mild discomfort dressing intact tolerating by mouth diet 1 sugars are returning to better level. Patient reports pain as 4 on 0-10 scale.    Objective: Vital signs in last 24 hours: Temp:  [97 F (36.1 C)-98.6 F (37 C)] 98.6 F (37 C) (06/15 0646) Pulse Rate:  [73-96] 76  (06/15 0646) Resp:  [16-18] 18  (06/15 0646) BP: (90-130)/(61-81) 125/70 mmHg (06/15 0646) SpO2:  [89 %-100 %] 100 % (06/15 0646)  Intake/Output from previous day: 06/14 0701 - 06/15 0700 In: 1438 [P.O.:60; I.V.:828; IV Piggyback:550] Out: 2 [Urine:2] Intake/Output this shift:     Basename 10/25/11 0505 10/24/11 0630 10/23/11 2019  HGB 10.6* 10.8* 11.8*    Basename 10/25/11 0505 10/24/11 0630  WBC 8.5 8.4  RBC 3.74* 3.78*  HCT 31.4* 31.7*  PLT 351 405*    Basename 10/25/11 0505 10/24/11 0630  NA 134* 131*  K 3.8 3.4*  CL 95* 93*  CO2 29 27  BUN 8 9  CREATININE 0.52 0.47*  GLUCOSE 262* 322*  CALCIUM 8.6 8.8   No results found for this basename: LABPT:2,INR:2 in the last 72 hours  Intact pulses distally Dorsiflexion/Plantar flexion intact Incision: moderate drainage  Assessment/Plan: 1 Day Post-Op Procedure(s) (LRB): AMPUTATION RAY (Right)   Advance diet Up with therapy D/C IV fluids Continue ABX therapy due to Post-op infection Discharge home with home health likely early next week. Macayla Ekdahl E 10/25/2011, 9:02 AM

## 2011-10-25 NOTE — Evaluation (Signed)
Physical Therapy Evaluation Patient Details Name: Dana [name] MRN: [id number] DOB: [birth year] Today's Date: 10/25/2011 Time: [id number] PT Time Calculation (min): 25 min  PT Assessment / Plan / Recommendation Clinical Impression  Pt is a 56 y/o female post op day 1 of R third ray amputation. Pt demonstrated safety and modified Independence with all assessed mobilty. Unable to assess pt on stairs due to pt feeling dizzy, likely from pain meds administered at start of session.  Will attempt stairs later today if schedule permitts.   Should be safe for discharge home when cleared by MD.      PT Assessment  Patient needs continued PT services    Follow Up Recommendations  No PT follow up    Barriers to Discharge None      lEquipment Recommendations  Rolling walker with 5" wheels    Recommendations for Other Services     Frequency Min 3X/week    Precautions / Restrictions Precautions Precautions: Fall Restrictions Weight Bearing Restrictions: Yes RLE Weight Bearing: Non weight bearing   Pertinent Vitals/Pain Pt c/o 8/10 pain in Rt Foot.  RN administered IV pain medication (Dilautid) at start of PT session.        Mobility  Bed Mobility Bed Mobility: Supine to Sit;Sit to Supine;Scooting to HOB Supine to Sit: 7: Independent;HOB flat Sit to Supine: 7: Independent;HOB flat Scooting to HOB: 5: Supervision;6: Modified independent (Device/Increase time) Details for Bed Mobility Assistance: verbal cueing for safe technique.  Transfers Transfers: Sit to Stand;Stand to Sit Sit to Stand: 5: Supervision;From bed;From chair/3-in-1;With upper extremity assist;With armrests Stand to Sit: 5: Supervision;To chair/3-in-1;To bed;With upper extremity assist Details for Transfer Assistance: Instuction and demonstration of proper technique with NWB on R LE.   Ambulation/Gait Ambulation/Gait Assistance: 5: Supervision Ambulation Distance (Feet): 30 Feet Assistive device: Rolling  walker Ambulation/Gait Assistance Details: instruction for technique with NWB on RLE and proper use of RW. Pt return demonstrated appropriate technique.   Gait Pattern: Step-to pattern General Gait Details: hop to gait with NWB RLE  Stairs: No Wheelchair Mobility Wheelchair Mobility: No    Exercises     PT Diagnosis: Difficulty walking;Acute pain  PT Problem List: Decreased knowledge of use of DME;Decreased safety awareness;Pain;Decreased mobility PT Treatment Interventions: Gait training;Stair training;Therapeutic activities;Therapeutic exercise;Patient/family education   PT Goals Acute Rehab PT Goals PT Goal Formulation: With patient Time For Goal Achievement: 11/01/11 Potential to Achieve Goals: Good Pt will Transfer Bed to Chair/Chair to Bed: with modified independence PT Transfer Goal: Bed to Chair/Chair to Bed - Progress: Goal set today Pt will Ambulate: 16 - 50 feet;with modified independence;with rolling walker PT Goal: Ambulate - Progress: Goal set today Pt will Go Up / Down Stairs: 1-2 stairs;with modified independence;with least restrictive assistive device PT Goal: Up/Down Stairs - Progress: Goal set today  Visit Information  Last PT Received On: 10/25/11    Subjective Data  Subjective: agree to PT eval.   Prior Functioning  Home Living Lives With: Spouse Available Help at Discharge: Family Type of Home: House Home Access: Stairs to enter;Ramped entrance Entrance Stairs-Number of Steps: 2 Entrance Stairs-Rails: Right;Left;Can reach both Home Layout: One level Bathroom Shower/Tub: Product/process development scientist: Handicapped height Bathroom Accessibility: Yes How Accessible: Accessible via wheelchair;Accessible via walker Home Adaptive Equipment: Grab bars around toilet;Grab bars in shower Prior Function Level of Independence: Independent Able to Take Stairs?: Yes Driving: Yes Communication Communication: No difficulties Dominant Hand: Right     Cognition  Overall Cognitive Status: Appears within functional  limits for tasks assessed/performed Arousal/Alertness: Awake/alert Orientation Level: Appears intact for tasks assessed;Oriented X4 / Intact Behavior During Session: [facility] for tasks performed    Extremity/Trunk Assessment Right Upper Extremity Assessment RUE ROM/Strength/Tone: Within functional levels Left Upper Extremity Assessment LUE ROM/Strength/Tone: Within functional levels Right Lower Extremity Assessment RLE ROM/Strength/Tone: Within functional levels Left Lower Extremity Assessment LLE ROM/Strength/Tone: Within functional levels Trunk Assessment Trunk Assessment: Normal   Balance Balance Balance Assessed: No  End of Session PT - End of Session Equipment Utilized During Treatment: Gait belt Activity Tolerance: Treatment limited secondary to medication (Pt felt woozy in mid session after receiving Dilautid IV) Patient left: in bed;with call bell/phone within reach;with bed alarm set Nurse Communication: Mobility status   [name] 10/25/2011, 8:36 AM  Collier Salina L. Welcome Fults DPT [phone]

## 2011-10-25 NOTE — Progress Notes (Signed)
Subjective: Patient feeling well. Pain controlled. No Bowel movement.  No chest pain or dyspnea.   Objective: Filed Vitals:   10/24/11 2010 10/24/11 2020 10/24/11 2306 10/25/11 0646  BP: 95/61 99/68 123/68 125/70  Pulse: 82 73 75 76  Temp:   98.6 F (37 C) 98.6 F (37 C)  TempSrc:      Resp: 16 16 18 18   Height:      Weight:      SpO2: 99% 100% 99% 100%   Weight change:    General: Alert, awake, oriented x3, in no acute distress.  HEENT: No bruits, no goiter.  Heart: Regular rate and rhythm, without murmurs, rubs, gallops.  Lungs: CTA, bilateral air movement.  Abdomen: Soft, nontender, nondistended, positive bowel sounds.  Neuro: Grossly intact, nonfocal. Extremities; Right LE with dressing, small amount blood dressing.   Lab Results:  [facility] 10/25/11 0505 10/24/11 0630  NA 134* 131*  K 3.8 3.4*  CL 95* 93*  CO2 29 27  GLUCOSE 262* 322*  BUN 8 9  CREATININE 0.52 0.47*  CALCIUM 8.6 8.8  MG -- --  PHOS -- --    Basename 10/24/11 0630  AST 11  ALT 12  ALKPHOS 129*  BILITOT 0.3  PROT 7.0  ALBUMIN 2.5*    Basename 10/25/11 0505 10/24/11 0630 10/23/11 2019  WBC 8.5 8.4 --  NEUTROABS -- 5.6 8.2*  HGB 10.6* 10.8* --  HCT 31.4* 31.7* --  MCV 84.0 83.9 --  PLT 351 405* --    Basename 10/24/11 0630  HGBA1C 11.8*    Basename 10/24/11 0630  VITAMINB12 431  FOLATE 9.9  FERRITIN 221  TIBC 222*  IRON 25*  RETICCTPCT 1.8    Micro Results: Recent Results (from the past 240 hour(s))  ANAEROBIC CULTURE     Status: Normal (Preliminary result)   Collection Time   10/24/11  7:11 PM      Component Value Range Status Comment   Specimen Description WOUND FOOT RIGHT   Final    Special Requests PATIENT ON FOLLOWING VANC,ZOSYN   Final    Gram Stain     Final    Value: NO WBC SEEN     NO SQUAMOUS EPITHELIAL CELLS SEEN     FEW GRAM POSITIVE COCCI IN PAIRS   Culture PENDING   Incomplete    Report Status PENDING   Incomplete   WOUND CULTURE     Status: Normal  (Preliminary result)   Collection Time   10/24/11  7:11 PM      Component Value Range Status Comment   Specimen Description TISSUE RIGHT FOOT   Final    Special Requests PT ON ZOSYN,VANC   Final    Gram Stain     Final    Value: NO WBC SEEN     NO SQUAMOUS EPITHELIAL CELLS SEEN     FEW GRAM POSITIVE COCCI IN PAIRS   Culture NO GROWTH   Final    Report Status PENDING   Incomplete   SURGICAL PCR SCREEN     Status: Abnormal   Collection Time   10/25/11  8:05 AM      Component Value Range Status Comment   MRSA, PCR NEGATIVE  NEGATIVE Final    Staphylococcus aureus POSITIVE (*) NEGATIVE Final     Studies/Results: Mr Foot Right W Wo Contrast  10/24/2011  *RADIOLOGY REPORT*  Clinical Data: Diabetic foot ulcer, query osteomyelitis.  MRI OF THE LEFT FOREFOOT WITHOUT AND WITH CONTRAST  Technique:  Multiplanar, multisequence MR imaging was performed both before and after administration of intravenous contrast.  Contrast: 30mL MULTIHANCE GADOBENATE DIMEGLUMINE 529 MG/ML IV SOLN  Comparison: 10/01/2011  Findings: Abnormal destructive findings in the proximal phalanx of the third toe noted, compatible with osteomyelitis.  Abnormal high T2 and low T1 signal intensity in the head of the third metatarsal, middle phalanx of the third toe, and proximal phalanx of the fourth valve also reflect osteomyelitis.  Equivocal edema / enhancement in the middle phalanx of the fourth toe could conceivably reflect osteomyelitis.  There is a large draining abscess along the ball of the foot, with extensive surrounding enhancing tissue as shown on image 9 of series 11.  This abscess may potentially connect to the third and fourth metatarsophalangeal joints and accordingly septic joint is not excluded.  Some of the abscess extends dorsal to the metatarsophalangeal joint and along the inner metatarsal bursal region, with gas extending both plantar and dorsal to the metatarsophalangeal joints in the soft tissues.  There is edema in  the third metatarsal shaft and distal head compatible with osteomyelitis.  Equivocal osteomyelitis in the fourth metatarsal noted.  Diffuse subcutaneous edema is present.  On images five through seven of series 11, there is a small fluid collection is plantar to the medial sesamoid of the first digit which could reflect a small abscess.  Midfoot degenerative spurring noted.  No malalignment at the Lisfranc joint is observed.  There is spurring of the metatarsal bases.  IMPRESSION:  1.  Bony destructive osteomyelitis of the proximal phalanx of the third toe, with abnormal edema and enhancement indicative of osteomyelitis in the third metatarsal, middle phalanx of the third toe, proximal and middle phalanges of the fourth toe, and potentially in the fourth metatarsal. 2.  Large draining abscess along the ball of the foot, with gas and fluid tracking along the third and fourth metatarsophalangeal joints (which are likely infected) to the dorsal soft tissues of the foot.  3.  Diffuse cellulitis and fasciitis in the foot. 4.  Small fluid collection plantar to the medial sesamoid of the first digit may reflect a small abscess. 5.  Degenerative spurring of the midfoot and along the Lisfranc joint.  Original Report Authenticated By: Carron Curie, M.D.   Dg Foot 2 Views Right  10/23/2011  *RADIOLOGY REPORT*  Clinical Data: Foot pain  RIGHT FOOT - 2 VIEW  Comparison: Prior films same day and 10/01/2011  Findings: Two views of the right foot submitted.  There is soft tissue swelling and soft tissue air at the base of proximal phalanx and metatarsal phalangeal joint third and fourth toe.  There is cortical irregularity at the base of proximal phalanx third toe. Findings are suspicious for cellulitis and early osteomyelitis.  I cannot exclude a pathologic fracture.  IMPRESSION:  There is soft tissue swelling and soft tissue air at the base of proximal phalanx and metatarsal phalangeal joint third and fourth toe.   There is cortical irregularity at the base of proximal phalanx third toe.  Findings are suspicious for cellulitis and early osteomyelitis.  I cannot exclude a pathologic fracture.  Original Report Authenticated By: Lahoma Crocker, M.D.    Medications: I have reviewed the patient's current medications.  1. Cellulitis of the right foot with Osteomyelitis Right Foot, Third Toe, second metatarsal, large abscess:  Patient had Amputation second and third toe right foot on 6-14. Culture growth: Gram Positive cocci in Pairs.   -Continue with vancomycin and Zosyn. Follow culture  result. Add Bowel regimen.   2. Diabetes mellitus 2 - patient has been off her Lantus for last one month due to insurance issues. Continue with Lantus 15 units. Continue with SSI. Explain to patient importance of good sugar control to help with resolution of infection.   3. Bipolar disorder - continue present medications.  4. Normocytic normochromic: Monitor hb. Combination anemia of chronic disease vs iron deficiency. Mild decrease HB post surgery expected.  5-Mild hyponatremia. Improved with IV fluids.   Disposition:  CM consulted to help with medications, and establish care with PCP.  PT consulted.  Follow culture result to adjust antibiotics.       LOS: 2 days   Edge Mauger M.D.  [facility] 10/25/2011, 10:55 AM

## 2011-10-25 NOTE — ED Provider Notes (Signed)
I saw and evaluated the patient, reviewed the resident's note and I agree with the findings and plan.  3 weeks of R foot pain and swelling s/p treatment at OSH.  Swelling and erythema to R fore and mid foot with weeping plantar ulcerations.  Ezequiel Essex, MD 10/25/11 [phone]

## 2011-10-26 DIAGNOSIS — L03119 Cellulitis of unspecified part of limb: Secondary | ICD-10-CM

## 2011-10-26 DIAGNOSIS — IMO0001 Reserved for inherently not codable concepts without codable children: Secondary | ICD-10-CM

## 2011-10-26 DIAGNOSIS — L02419 Cutaneous abscess of limb, unspecified: Secondary | ICD-10-CM

## 2011-10-26 DIAGNOSIS — Z8659 Personal history of other mental and behavioral disorders: Secondary | ICD-10-CM

## 2011-10-26 DIAGNOSIS — E1165 Type 2 diabetes mellitus with hyperglycemia: Secondary | ICD-10-CM

## 2011-10-26 LAB — BASIC METABOLIC PANEL
BUN: 10 mg/dL (ref 6–23)
Chloride: 95 mEq/L — ABNORMAL LOW (ref 96–112)
GFR calc Af Amer: >90 mL/min (ref >90)
Glucose, Bld: 187 mg/dL — ABNORMAL HIGH (ref 70–99)
Potassium: 3.5 mEq/L (ref 3.5–5.1)

## 2011-10-26 LAB — GLUCOSE, CAPILLARY
Glucose-Capillary: 165 mg/dL — ABNORMAL HIGH (ref 70–99)
Glucose-Capillary: 173 mg/dL — ABNORMAL HIGH (ref 70–99)

## 2011-10-26 LAB — CBC
HCT: 29.6 % — ABNORMAL LOW (ref 36.0–46.0)
Hemoglobin: 9.9 g/dL — ABNORMAL LOW (ref 12.0–15.0)
RDW: 12.4 % (ref 11.5–15.5)
WBC: 8.1 10*3/uL (ref 4.0–10.5)

## 2011-10-26 MED ORDER — FERROUS SULFATE 325 (65 FE) MG PO TABS
325.0000 mg | ORAL_TABLET | Freq: Two times a day (BID) | ORAL | Status: DC
  Administered 2011-10-26 – 2011-10-28 (×4): 325 mg via ORAL
  Filled 2011-10-26 (×6): qty 1

## 2011-10-26 MED ORDER — DOCUSATE SODIUM 100 MG PO CAPS
100.0000 mg | ORAL_CAPSULE | Freq: Two times a day (BID) | ORAL | Status: DC
  Administered 2011-10-26 – 2011-10-28 (×5): 100 mg via ORAL
  Filled 2011-10-26 (×5): qty 1

## 2011-10-26 NOTE — Progress Notes (Signed)
Subjective: 2 Days Post-Op Procedure(s) (LRB): AMPUTATION RAY (Right) awake alert oriented x3. No complaints. Receiving IV antibiotic treatment. Patient reports pain as 4 on 0-10 scale.    Objective: Vital signs in last 24 hours: Temp:  [97 F (36.1 C)-97.4 F (36.3 C)] 97.4 F (36.3 C) (06/15 2101) Pulse Rate:  [72-74] 74  (06/15 2101) Resp:  [16-18] 16  (06/15 2101) BP: (102-108)/(66-67) 102/66 mmHg (06/15 2101) SpO2:  [95 %] 95 % (06/15 2101)  Intake/Output from previous day: 06/15 0701 - 06/16 0700 In: 600 [P.O.:600] Out: 1 [Urine:1] Intake/Output this shift: Total I/O In: 240 [P.O.:240] Out: -    Basename 10/26/11 0510 10/25/11 0505 10/24/11 0630 10/23/11 2019  HGB 9.9* 10.6* 10.8* 11.8*    Basename 10/26/11 0510 10/25/11 0505  WBC 8.1 8.5  RBC 3.52* 3.74*  HCT 29.6* 31.4*  PLT 330 351    Basename 10/26/11 0510 10/25/11 0505  NA 133* 134*  K 3.5 3.8  CL 95* 95*  CO2 30 29  BUN 10 8  CREATININE 0.50 0.52  GLUCOSE 187* 262*  CALCIUM 8.7 8.6   No results found for this basename: LABPT:2,INR:2 in the last 72 hours  ABD soft Intact pulses distally Dorsiflexion/Plantar flexion intact Incision: moderate drainage Compartment soft Dressing change today mild erythremia induration about the incision site extending to the area between the toes are new dry dressing with Telfa 4 x 4's Kerlix Ace wrap applied.  Assessment/Plan: 2 Days Post-Op Procedure(s) (LRB): AMPUTATION RAY (Right)   Advance diet Up with therapy D/C IV fluids Discharge home with home health  Henrique Parekh E 10/26/2011, 11:24 AM

## 2011-10-26 NOTE — Progress Notes (Signed)
Subjective: Feeling well. No complaints. No BM.  Objective: Filed Vitals:   10/24/11 2306 10/25/11 0646 10/25/11 1300 10/25/11 2101  BP: 123/68 125/70 108/67 102/66  Pulse: 75 76 72 74  Temp: 98.6 F (37 C) 98.6 F (37 C) 97 F (36.1 C) 97.4 F (36.3 C)  TempSrc:   Tympanic Oral  Resp: 18 18 18 16   Height:      Weight:      SpO2: 99% 100% 95% 95%   Weight change:    General: Alert, awake, oriented x3, in no acute distress.  HEENT: No bruits, no goiter.  Heart: Regular rate and rhythm, without murmurs, rubs, gallops.  Lungs: CTA, bilateral air movement.  Abdomen: Soft, nontender, nondistended, positive bowel sounds.  Neuro: Grossly intact, nonfocal. Extremities; Right LE with dressing.   Lab Results:  [facility] 10/26/11 0510 10/25/11 0505  NA 133* 134*  K 3.5 3.8  CL 95* 95*  CO2 30 29  GLUCOSE 187* 262*  BUN 10 8  CREATININE 0.50 0.52  CALCIUM 8.7 8.6  MG -- --  PHOS -- --    Basename 10/24/11 0630  AST 11  ALT 12  ALKPHOS 129*  BILITOT 0.3  PROT 7.0  ALBUMIN 2.5*   No results found for this basename: LIPASE:2,AMYLASE:2 in the last 72 hours  Basename 10/26/11 0510 10/25/11 0505 10/24/11 0630 10/23/11 2019  WBC 8.1 8.5 -- --  NEUTROABS -- -- 5.6 8.2*  HGB 9.9* 10.6* -- --  HCT 29.6* 31.4* -- --  MCV 84.1 84.0 -- --  PLT 330 351 -- --   Basename 10/24/11 0630  HGBA1C 11.8*    Basename 10/24/11 0630  VITAMINB12 431  FOLATE 9.9  FERRITIN 221  TIBC 222*  IRON 25*  RETICCTPCT 1.8    Micro Results: Recent Results (from the past 240 hour(s))  ANAEROBIC CULTURE     Status: Normal (Preliminary result)   Collection Time   10/24/11  7:11 PM      Component Value Range Status Comment   Specimen Description WOUND FOOT RIGHT   Final    Special Requests PATIENT ON FOLLOWING VANC,ZOSYN   Final    Gram Stain     Final    Value: NO WBC SEEN     NO SQUAMOUS EPITHELIAL CELLS SEEN     FEW GRAM POSITIVE COCCI IN PAIRS   Culture     Final    Value: NO  ANAEROBES ISOLATED; CULTURE IN PROGRESS FOR 5 DAYS   Report Status PENDING   Incomplete   WOUND CULTURE     Status: Normal (Preliminary result)   Collection Time   10/24/11  7:11 PM      Component Value Range Status Comment   Specimen Description TISSUE RIGHT FOOT   Final    Special Requests PT ON ZOSYN,VANC   Final    Gram Stain     Final    Value: NO WBC SEEN     NO SQUAMOUS EPITHELIAL CELLS SEEN     FEW GRAM POSITIVE COCCI IN PAIRS   Culture FEW GRAM NEGATIVE RODS   Final    Report Status PENDING   Incomplete   SURGICAL PCR SCREEN     Status: Abnormal   Collection Time   10/25/11  8:05 AM      Component Value Range Status Comment   MRSA, PCR NEGATIVE  NEGATIVE Final    Staphylococcus aureus POSITIVE (*) NEGATIVE Final     Studies/Results: No results found.  Medications:  I have reviewed the patient's current medications.  1. Cellulitis of the right foot with Osteomyelitis Right Foot, Third Toe, second metatarsal, large abscess:  Patient had Amputation second and third toe right foot on 6-14. Culture growth: Gram Positive cocci in Pairs and gram negative rods.  -Continue with vancomycin and Zosyn. Follow culture result to help with antibiotics guidance.   2. Diabetes mellitus 2 - patient has been off her Lantus for last one month due to insurance issues.  Continue with Lantus 15 units. Continue with SSI. Explain to patient importance of good sugar control to help with resolution of infection.   3. Bipolar disorder - continue present medications.   4. Normocytic normochromic: Monitor hb. Combination anemia of chronic disease vs iron deficiency. Mild decrease HB post surgery expected.   5-Mild hyponatremia. Improved with IV fluids.  Disposition:  CM consulted to help with medications, and establish care with PCP.  PT consulted.  Follow culture result to adjust antibiotics.       LOS: 3 days   Britlyn Martine M.D.  [facility] 10/26/2011, 12:43 PM

## 2011-10-26 NOTE — Progress Notes (Signed)
Physical Therapy Treatment Patient Details Name: Dana [name] MRN: [id number] DOB: 1956/03/15 Today's Date: 10/26/2011 Time: [id number] PT Time Calculation (min): 23 min  PT Assessment / Plan / Recommendation Comments on Treatment Session  Pt admitted s/p right foot 3rd ray amputation and continues to progress.  Pt able to tolerate increased ambulation distance this am while maintaining NWBing right LE as well as stair negotiation.  Very motivated.    Follow Up Recommendations  No PT follow up    Barriers to Discharge        Equipment Recommendations  Rolling walker with 5" wheels    Recommendations for Other Services    Frequency Min 3X/week   Plan Discharge plan remains appropriate;Frequency remains appropriate    Precautions / Restrictions Precautions Precautions: Fall Restrictions Weight Bearing Restrictions: Yes RLE Weight Bearing: Non weight bearing   Pertinent Vitals/Pain 0/10 this morning.    Mobility  Bed Mobility Bed Mobility: Supine to Sit;Sit to Supine Supine to Sit: 7: Independent;HOB flat Sit to Supine: 7: Independent;HOB flat Transfers Transfers: Sit to Stand;Stand to Sit (2 trials.) Sit to Stand: 5: Supervision;With upper extremity assist;From bed;From chair/3-in-1 Stand to Sit: 5: Supervision;With upper extremity assist;To chair/3-in-1;To bed Details for Transfer Assistance: Verbal cues for safest hand placement. Ambulation/Gait Ambulation/Gait Assistance: 5: Supervision Ambulation Distance (Feet): 50 Feet Assistive device: Rolling walker Ambulation/Gait Assistance Details: Verbal cues for safe technique in order to maintain NWBing right LE throughout. Gait Pattern: Step-to pattern Stairs: Yes Stairs Assistance: 4: Min assist Stairs Assistance Details (indicate cue type and reason): Assist to off weight right side in order to maintain NWBing right LE as well as to steady RW for backwards sequence.  Cues for "up with good, down with bad" and in order  to maintain NWBing right LE. Stair Management Technique: Alternating pattern;Backwards;With walker Number of Stairs: 2  Wheelchair Mobility Wheelchair Mobility: No    Exercises     PT Diagnosis:    PT Problem List:   PT Treatment Interventions:     PT Goals Acute Rehab PT Goals PT Goal Formulation: With patient Time For Goal Achievement: 11/01/11 Potential to Achieve Goals: Good PT Goal: Ambulate - Progress: Progressing toward goal PT Goal: Up/Down Stairs - Progress: Progressing toward goal  Visit Information  Last PT Received On: 10/26/11 Assistance Needed: +1    Subjective Data  Subjective: "I am ready." Patient Stated Goal: Go home.   Cognition  Overall Cognitive Status: Appears within functional limits for tasks assessed/performed Arousal/Alertness: Awake/alert Orientation Level: Appears intact for tasks assessed;Oriented X4 / Intact Behavior During Session: [facility] for tasks performed    Balance  Balance Balance Assessed: No  End of Session PT - End of Session Equipment Utilized During Treatment: Gait belt Activity Tolerance: Patient tolerated treatment well Patient left: in bed;with call bell/phone within reach Nurse Communication: Mobility status    Cyndia Bent 10/26/2011, 8:50 AM  10/26/2011 Cyndia Bent, PT, DPT [phone]

## 2011-10-27 ENCOUNTER — Encounter (HOSPITAL_COMMUNITY): Payer: Self-pay | Admitting: Orthopedic Surgery

## 2011-10-27 DIAGNOSIS — L03119 Cellulitis of unspecified part of limb: Secondary | ICD-10-CM

## 2011-10-27 DIAGNOSIS — L02419 Cutaneous abscess of limb, unspecified: Secondary | ICD-10-CM

## 2011-10-27 DIAGNOSIS — IMO0001 Reserved for inherently not codable concepts without codable children: Secondary | ICD-10-CM

## 2011-10-27 DIAGNOSIS — E1165 Type 2 diabetes mellitus with hyperglycemia: Secondary | ICD-10-CM

## 2011-10-27 DIAGNOSIS — Z8659 Personal history of other mental and behavioral disorders: Secondary | ICD-10-CM

## 2011-10-27 LAB — GLUCOSE, CAPILLARY
Glucose-Capillary: 123 mg/dL — ABNORMAL HIGH (ref 70–99)
Glucose-Capillary: 185 mg/dL — ABNORMAL HIGH (ref 70–99)

## 2011-10-27 NOTE — Progress Notes (Addendum)
Subjective: Patient feeling well. No complaints.  Leg pain better.  Objective: Filed Vitals:   10/25/11 2101 10/26/11 1300 10/26/11 2000 10/27/11 0500  BP: 102/66 124/84 100/66 94/62  Pulse: 74 93 80 78  Temp: 97.4 F (36.3 C) 97.7 F (36.5 C) 98.2 F (36.8 C) 98 F (36.7 C)  TempSrc: Oral Oral Oral Oral  Resp: 16 18 18 18   Height:      Weight:      SpO2: 95% 100% 97% 95%   Weight change:    General: Alert, awake, oriented x3, in no acute distress.  HEENT: No bruits, no goiter.  Heart: Regular rate and rhythm, without murmurs, rubs, gallops.  Lungs: CTA, bilateral air movement.  Abdomen: Soft, nontender, nondistended, positive bowel sounds.  Neuro: Grossly intact, nonfocal. Extremities; right leg with clean dressing.   Lab Results:  [facility] 10/26/11 0510 10/25/11 0505  NA 133* 134*  K 3.5 3.8  CL 95* 95*  CO2 30 29  GLUCOSE 187* 262*  BUN 10 8  CREATININE 0.50 0.52  CALCIUM 8.7 8.6  MG -- --  PHOS -- --    Basename 10/26/11 0510 10/25/11 0505  WBC 8.1 8.5  NEUTROABS -- --  HGB 9.9* 10.6*  HCT 29.6* 31.4*  MCV 84.1 84.0  PLT 330 351    Micro Results: Recent Results (from the past 240 hour(s))  ANAEROBIC CULTURE     Status: Normal (Preliminary result)   Collection Time   10/24/11  7:11 PM      Component Value Range Status Comment   Specimen Description WOUND FOOT RIGHT   Final    Special Requests PATIENT ON FOLLOWING VANC,ZOSYN   Final    Gram Stain     Final    Value: NO WBC SEEN     NO SQUAMOUS EPITHELIAL CELLS SEEN     FEW GRAM POSITIVE COCCI IN PAIRS   Culture     Final    Value: NO ANAEROBES ISOLATED; CULTURE IN PROGRESS FOR 5 DAYS   Report Status PENDING   Incomplete   WOUND CULTURE     Status: Normal (Preliminary result)   Collection Time   10/24/11  7:11 PM      Component Value Range Status Comment   Specimen Description TISSUE RIGHT FOOT   Final    Special Requests PT ON ZOSYN,VANC   Final    Gram Stain     Final    Value: NO WBC SEEN       NO SQUAMOUS EPITHELIAL CELLS SEEN     FEW GRAM POSITIVE COCCI IN PAIRS   Culture FEW GRAM NEGATIVE RODS   Final    Report Status PENDING   Incomplete   SURGICAL PCR SCREEN     Status: Abnormal   Collection Time   10/25/11  8:05 AM      Component Value Range Status Comment   MRSA, PCR NEGATIVE  NEGATIVE Final    Staphylococcus aureus POSITIVE (*) NEGATIVE Final     Studies/Results: No results found.  Medications: I have reviewed the patient's current medications.  1. Cellulitis of the right foot with Osteomyelitis Right Foot, Third Toe, second metatarsal, large abscess:  Patient had Amputation second and third toe right foot on 6-14. Culture growth: Gram Positive cocci in Pairs and enterobacter. Sensitivity pending.  -Continue with vancomycin and Zosyn day 4. Follow culture result to help with antibiotics guidance. If sensitivity available tomorrow will plan for discharge.   2. Diabetes mellitus 2 - patient  has been off her Lantus for last one month due to insurance issues.  Continue with Lantus 15 units. Continue with SSI. Explain to patient importance of good sugar control to help with resolution of infection.  3. Bipolar disorder - continue present medications.  4. Normocytic normochromic: Monitor hb. Combination anemia of chronic disease vs iron deficiency. Mild decrease HB post surgery expected.  5-Mild hyponatremia. Improved with IV fluids.   Disposition:  CM consulted to help with medications, and establish care with PCP.  PT consulted.  Follow culture result to adjust antibiotics.       LOS: 4 days   Kiana Hollar M.D.  [facility] 10/27/2011, 9:57 AM

## 2011-10-27 NOTE — Progress Notes (Signed)
Patient ID: Dana [name], female   DOB: [birth year], 56 y.o.   MRN: [id number] We'll have the dressing changed today. Okay for discharge to home strict nonweightbearing and elevation right lower extremity. followup in the office in 2 weeks.

## 2011-10-27 NOTE — Progress Notes (Signed)
ANTIBIOTIC CONSULT NOTE - INITIAL  Pharmacy Consult for Vancomycin, Zosyn Indication: Cellulitis  Allergies  Allergen Reactions  . Ativan (Lorazepam) Other (See Comments)    Abnormal behavior  . Darvocet (Propoxyphene-Acetaminophen) Itching  . Demerol (Meperidine) Other (See Comments)    Abnormal behavior  . Latex Itching  . Oxycodone Other (See Comments)    Abnormal behavior    Patient Measurements: Height: 5\' 1"  (154.9 cm) Weight: 165 lb 5.5 oz (75 kg) IBW/kg (Calculated) : 47.8  Per RN, 5'1" and 75 kg  Vital Signs: Temp: 98 F (36.7 C) (06/17 0500) Temp src: Oral (06/17 0500) BP: 94/62 mmHg (06/17 0500) Pulse Rate: 78  (06/17 0500) Intake/Output from previous day: 06/16 0701 - 06/17 0700 In: 480 [P.O.:480] Out: -  Intake/Output from this shift:    Labs:  Basename 10/26/11 0510 10/25/11 0505  WBC 8.1 8.5  HGB 9.9* 10.6*  PLT 330 351  LABCREA -- --  CREATININE 0.50 0.52   Estimated Creatinine Clearance: 74.5 ml/min (by C-G formula based on Cr of 0.5). No results found for this basename: VANCOTROUGH:2,VANCOPEAK:2,VANCORANDOM:2,GENTTROUGH:2,GENTPEAK:2,GENTRANDOM:2,TOBRATROUGH:2,TOBRAPEAK:2,TOBRARND:2,AMIKACINPEAK:2,AMIKACINTROU:2,AMIKACIN:2, in the last 72 hours   Microbiology: Recent Results (from the past 720 hour(s))  ANAEROBIC CULTURE     Status: Normal (Preliminary result)   Collection Time   10/24/11  7:11 PM      Component Value Range Status Comment   Specimen Description WOUND FOOT RIGHT   Final    Special Requests PATIENT ON FOLLOWING VANC,ZOSYN   Final    Gram Stain     Final    Value: NO WBC SEEN     NO SQUAMOUS EPITHELIAL CELLS SEEN     FEW GRAM POSITIVE COCCI IN PAIRS   Culture     Final    Value: NO ANAEROBES ISOLATED; CULTURE IN PROGRESS FOR 5 DAYS   Report Status PENDING   Incomplete   WOUND CULTURE     Status: Normal (Preliminary result)   Collection Time   10/24/11  7:11 PM      Component Value Range Status Comment   Specimen  Description TISSUE RIGHT FOOT   Final    Special Requests PT ON ZOSYN,VANC   Final    Gram Stain     Final    Value: NO WBC SEEN     NO SQUAMOUS EPITHELIAL CELLS SEEN     FEW GRAM POSITIVE COCCI IN PAIRS   Culture FEW GRAM NEGATIVE RODS   Final    Report Status PENDING   Incomplete   SURGICAL PCR SCREEN     Status: Abnormal   Collection Time   10/25/11  8:05 AM      Component Value Range Status Comment   MRSA, PCR NEGATIVE  NEGATIVE Final    Staphylococcus aureus POSITIVE (*) NEGATIVE Final     Medical History: Past Medical History  Diagnosis Date  . Diabetes mellitus   . Bipolar 1 disorder   . Hypertension     past hx of    Medications:  Scheduled:     . docusate sodium  100 mg Oral BID  . enoxaparin  40 mg Subcutaneous Q24H  . ferrous sulfate  325 mg Oral BID WC  . insulin aspart  0-9 Units Subcutaneous TID WC  . insulin glargine  15 Units Subcutaneous Daily  . lithium carbonate  300 mg Oral Q supper  . lithium carbonate  600 mg Oral BID  . piperacillin-tazobactam (ZOSYN)  IV  3.375 g Intravenous Q8H  . vancomycin  1,000  mg Intravenous Q12H  . venlafaxine  37.5 mg Oral BID  . DISCONTD: polyethylene glycol  17 g Oral BID   Assessment: 56 yo female admitted with cellulitis of right foot and possible osteomyelitis. S/p amputation. Abx cont post-op due to infection. Since being dc, will not draw any level.   Goal of Therapy:  Vancomycin trough 10-15 mcg/mL   Plan:  1. Zosyn 3.375gm IV Q8H (4 hr infusion).  2. Vancomycin 1 gm IV Q12H.  Onnie Boer Woodall 10/27/2011,8:38 AM

## 2011-10-27 NOTE — Progress Notes (Signed)
PAC Choice  DURABLE MEDICAL EQUIPMENT   Choice offered to / List presented to:     DME arranged  [place]           Status of service:  Completed, signed off

## 2011-10-27 NOTE — Discharge Instructions (Signed)
Elevation and strict nonweightbearing right lower extremity.

## 2011-10-28 DIAGNOSIS — L02419 Cutaneous abscess of limb, unspecified: Secondary | ICD-10-CM

## 2011-10-28 DIAGNOSIS — Z8659 Personal history of other mental and behavioral disorders: Secondary | ICD-10-CM

## 2011-10-28 DIAGNOSIS — IMO0001 Reserved for inherently not codable concepts without codable children: Secondary | ICD-10-CM

## 2011-10-28 DIAGNOSIS — E1165 Type 2 diabetes mellitus with hyperglycemia: Secondary | ICD-10-CM

## 2011-10-28 DIAGNOSIS — L03119 Cellulitis of unspecified part of limb: Secondary | ICD-10-CM

## 2011-10-28 LAB — WOUND CULTURE: Gram Stain: NONE SEEN

## 2011-10-28 MED ORDER — FERROUS SULFATE 325 (65 FE) MG PO TABS: 325.0000 mg | ORAL_TABLET | Freq: Two times a day (BID) | ORAL | Status: DC

## 2011-10-28 MED ORDER — CIPROFLOXACIN HCL 500 MG PO TABS: 500.0000 mg | ORAL_TABLET | Freq: Two times a day (BID) | ORAL | Status: AC

## 2011-10-28 MED ORDER — DOXYCYCLINE HYCLATE 50 MG PO CAPS: 100.0000 mg | ORAL_CAPSULE | Freq: Two times a day (BID) | ORAL | Status: AC

## 2011-10-28 MED ORDER — TRAMADOL HCL 50 MG PO TABS: 50.0000 mg | ORAL_TABLET | Freq: Three times a day (TID) | ORAL | Status: AC | PRN

## 2011-10-28 MED ORDER — DSS 100 MG PO CAPS: 100.0000 mg | ORAL_CAPSULE | Freq: Two times a day (BID) | ORAL | Status: AC

## 2011-10-28 MED ORDER — INSULIN GLARGINE 100 UNIT/ML ~~LOC~~ SOLN: 15.0000 [IU] | Freq: Every day | SUBCUTANEOUS | Status: DC

## 2011-10-28 NOTE — Discharge Summary (Signed)
Physician Discharge Summary  Dana [name] [id number] DOB: [birth year] DOA: 10/23/2011  PCP: William Hamburger, MD  Admit date: 10/23/2011 Discharge date: 10/28/2011  Discharge Diagnoses:   Cellulitis of the right foot with Osteomyelitis Right Foot, Third Toe, second metatarsal, large abscess DM2 (diabetes mellitus, type 2) Bipolar 1 disorder   Discharge Condition: Stable.   Disposition:  Follow-up Information    Follow up with DUDA,MARCUS V, MD in 2 weeks.   Contact information:   [place] [phone]          Diet: Heart healthy.   History of present illness:   56 year-old female with known history of diabetes mellitus who is not on medications for last one month due to financial issues and history of bipolar disorder started noticing some insect bite-like lesions on the plantar aspect of her right foot on May 20 4 weeks ago. This slowly worsening to swelling of the distal aspect of the foot and she had gone to [facility] and was admitted for cellulitis. Patient on IV vancomycin for almost a week and was discharged on Bactrim and Keflex. Patient at the oral antibiotics for another one week. It's been since a week since she took the last dose of antibiotic. The swelling worsened over the week and also noticed some discharge. She had gone to a podiatrist yesterday and was referred to the ER. In the ER at [facility] patient had x-ray done of the right foot which also shows features possibility of early osteomyelitis. There is also air in the soft tissues. Orthopedic on call was consulted by the ER physician. They'll be seeing patient in consult and hospitalist has been requested to admit.   Hospital Course:   1. Cellulitis of the right foot with Osteomyelitis Right Foot, Third Toe, second metatarsal, large abscess: Patient presents with right lower extremity worsening swelling, pain. She had MRI that show : Bony destructive  osteomyelitis of the proximal phalanx of the third toe, with abnormal edema and enhancement indicative of osteomyelitis in the third metatarsal, middle phalanx of the third toe, proximal and middle phalanges of the fourth toe, and potentially in the fourth metatarsal. Large draining abscess along the ball of the foot, with gas and fluid tracking along the third and fourth metatarsophalangeal joints (which are likely infected) to the dorsal soft tissues of the foot. Diffuse cellulitis and fasciitis in the foot. Small fluid collection plantar to the medial sesamoid of the first digit may reflect a small abscess Patient had Amputation second and third toe right foot on 6-14. Culture growth: Gram Positive cocci in Pairs and enterobacter cloecae. I Spoke with Micro, bacteria is sensitive to Cipro. She had also Gram positive Cocci  Pairs. Patient was treated with Vancomycin and zosyn. She will be discharge on ciprofloxacin and Doxycycline, will give prescription for  2 weeks treatment. She will need to follow up with Dr Sharol Given. .  -Continue with vancomycin and Zosyn day 4. Follow culture result to help with antibiotics guidance. If sensitivity available tomorrow will plan for discharge.   2. Diabetes mellitus 2 - patient has been off her Lantus for last one month due to insurance issues. Continue with Lantus 15 units at discharge.  Explain to patient importance of good sugar control to help with resolution of infection.   3. Bipolar disorder - continue present medications.   4. Normocytic normochromic: Monitor hb. Combination anemia of chronic disease vs iron deficiency. Mild decrease HB post surgery expected. Patient  will received trial of ferrous sulfate.  5-Mild hyponatremia. Improved with IV fluids.    Discharge Exam: Filed Vitals:   10/28/11 0557  BP: 102/61  Pulse: 69  Temp: 98.2 F (36.8 C)  Resp: 20   Filed Vitals:   10/27/11 0500 10/27/11 1448 10/27/11 2240 10/28/11 0557  BP: 94/62 101/70  100/58 102/61  Pulse: 78 73 79 69  Temp: 98 F (36.7 C) 98 F (36.7 C) 97.8 F (36.6 C) 98.2 F (36.8 C)  TempSrc: Oral Oral    Resp: 18  18 20   Height:      Weight:      SpO2: 95% 97% 98% 96%   General: Patient alert oriented, no distress.  Cardiovascular: S1,S2 RRR. Respiratory: CTA. Right LE with dressing.   Discharge Instructions  Discharge Orders    Future Orders Please Complete By Expires   Diet - low sodium heart healthy        Medication List  As of 10/28/2011 11:35 AM   TAKE these medications         ciprofloxacin 500 MG tablet   Commonly known as: CIPRO   Take 1 tablet (500 mg total) by mouth 2 (two) times daily.      doxycycline 50 MG capsule   Commonly known as: VIBRAMYCIN   Take 2 capsules (100 mg total) by mouth 2 (two) times daily.      DSS 100 MG Caps   Take 100 mg by mouth 2 (two) times daily.      ferrous sulfate 325 (65 FE) MG tablet   Take 1 tablet (325 mg total) by mouth 2 (two) times daily with a meal.      insulin glargine 100 UNIT/ML injection   Commonly known as: LANTUS   Inject 15 Units into the skin daily.      lithium carbonate 300 MG capsule   Take 300-600 mg by mouth 3 (three) times daily with meals. Takes 2 in the am, 1 at dinner & 2 at night      traMADol 50 MG tablet   Commonly known as: ULTRAM   Take 1 tablet (50 mg total) by mouth every 8 (eight) hours as needed.      venlafaxine 37.5 MG tablet   Commonly known as: EFFEXOR   Take 37.5 mg by mouth 2 (two) times daily.              The results of significant diagnostics from this hospitalization (including imaging, microbiology, ancillary and laboratory) are listed below for reference.    Significant Diagnostic Studies: Mr Foot Right W Wo Contrast  10/27/2011  **ADDENDUM** CREATED: 10/25/2011 10:01:32  Please note that the dictation system applied the template for MRI left foot with and without contrast, but the right foot was imaged. Accordingly, the title should be,  "MRI of the RIGHT foot with and without contrast."  **END ADDENDUM** SIGNED BY: Lynnell Chad. Janeece Fitting, M.D.   10/24/2011  *RADIOLOGY REPORT*  Clinical Data: Diabetic foot ulcer, query osteomyelitis.  MRI OF THE LEFT FOREFOOT WITHOUT AND WITH CONTRAST  Technique:  Multiplanar, multisequence MR imaging was performed both before and after administration of intravenous contrast.  Contrast: 80mL MULTIHANCE GADOBENATE DIMEGLUMINE 529 MG/ML IV SOLN  Comparison: 10/01/2011  Findings: Abnormal destructive findings in the proximal phalanx of the third toe noted, compatible with osteomyelitis.  Abnormal high T2 and low T1 signal intensity in the head of the third metatarsal, middle phalanx of the third toe, and proximal phalanx of the  fourth valve also reflect osteomyelitis.  Equivocal edema / enhancement in the middle phalanx of the fourth toe could conceivably reflect osteomyelitis.  There is a large draining abscess along the ball of the foot, with extensive surrounding enhancing tissue as shown on image 9 of series 11.  This abscess may potentially connect to the third and fourth metatarsophalangeal joints and accordingly septic joint is not excluded.  Some of the abscess extends dorsal to the metatarsophalangeal joint and along the inner metatarsal bursal region, with gas extending both plantar and dorsal to the metatarsophalangeal joints in the soft tissues.  There is edema in the third metatarsal shaft and distal head compatible with osteomyelitis.  Equivocal osteomyelitis in the fourth metatarsal noted.  Diffuse subcutaneous edema is present.  On images five through seven of series 11, there is a small fluid collection is plantar to the medial sesamoid of the first digit which could reflect a small abscess.  Midfoot degenerative spurring noted.  No malalignment at the Lisfranc joint is observed.  There is spurring of the metatarsal bases.  IMPRESSION:  1.  Bony destructive osteomyelitis of the proximal phalanx of the  third toe, with abnormal edema and enhancement indicative of osteomyelitis in the third metatarsal, middle phalanx of the third toe, proximal and middle phalanges of the fourth toe, and potentially in the fourth metatarsal. 2.  Large draining abscess along the ball of the foot, with gas and fluid tracking along the third and fourth metatarsophalangeal joints (which are likely infected) to the dorsal soft tissues of the foot.  3.  Diffuse cellulitis and fasciitis in the foot. 4.  Small fluid collection plantar to the medial sesamoid of the first digit may reflect a small abscess. 5.  Degenerative spurring of the midfoot and along the Lisfranc joint.  Original Report Authenticated By: Carron Curie, M.D.   Dg Foot 2 Views Right  10/23/2011  *RADIOLOGY REPORT*  Clinical Data: Foot pain  RIGHT FOOT - 2 VIEW  Comparison: Prior films same day and 10/01/2011  Findings: Two views of the right foot submitted.  There is soft tissue swelling and soft tissue air at the base of proximal phalanx and metatarsal phalangeal joint third and fourth toe.  There is cortical irregularity at the base of proximal phalanx third toe. Findings are suspicious for cellulitis and early osteomyelitis.  I cannot exclude a pathologic fracture.  IMPRESSION:  There is soft tissue swelling and soft tissue air at the base of proximal phalanx and metatarsal phalangeal joint third and fourth toe.  There is cortical irregularity at the base of proximal phalanx third toe.  Findings are suspicious for cellulitis and early osteomyelitis.  I cannot exclude a pathologic fracture.  Original Report Authenticated By: Lahoma Crocker, M.D.    Microbiology: Recent Results (from the past 240 hour(s))  ANAEROBIC CULTURE     Status: Normal (Preliminary result)   Collection Time   10/24/11  7:11 PM      Component Value Range Status Comment   Specimen Description WOUND FOOT RIGHT   Final    Special Requests PATIENT ON FOLLOWING VANC,ZOSYN   Final    Gram  Stain     Final    Value: NO WBC SEEN     NO SQUAMOUS EPITHELIAL CELLS SEEN     FEW GRAM POSITIVE COCCI IN PAIRS   Culture     Final    Value: NO ANAEROBES ISOLATED; CULTURE IN PROGRESS FOR 5 DAYS   Report Status PENDING  Incomplete   WOUND CULTURE     Status: Normal   Collection Time   10/24/11  7:11 PM      Component Value Range Status Comment   Specimen Description TISSUE RIGHT FOOT   Final    Special Requests PT ON ZOSYN,VANC   Final    Gram Stain     Final    Value: NO WBC SEEN     NO SQUAMOUS EPITHELIAL CELLS SEEN     FEW GRAM POSITIVE COCCI IN PAIRS   Culture     Final    Value: FEW ENTEROBACTER CLOACAE     Note: PHONED TO DR. Tyrell Antonio 06.18.13 1125 BY [facility]   Report Status 10/28/2011 FINAL   Final    Organism ID, Bacteria ENTEROBACTER CLOACAE   Final   SURGICAL PCR SCREEN     Status: Abnormal   Collection Time   10/25/11  8:05 AM      Component Value Range Status Comment   MRSA, PCR NEGATIVE  NEGATIVE Final    Staphylococcus aureus POSITIVE (*) NEGATIVE Final      Labs: Basic Metabolic Panel:  Lab [id number] 10/25/11 0505 10/24/11 0630 10/23/11 2019  NA 133* 134* 131* 132*  K 3.5 3.8 -- --  CL 95* 95* 93* 94*  CO2 30 29 27 28   GLUCOSE 187* 262* 322* 326*  BUN 10 8 9 10   CREATININE 0.50 0.52 0.47* 0.52  CALCIUM 8.7 8.6 8.8 9.4  MG -- -- -- --  PHOS -- -- -- --   Liver Function Tests:  Lab 10/24/11 0630  AST 11  ALT 12  ALKPHOS 129*  BILITOT 0.3  PROT 7.0  ALBUMIN 2.5*   CBC:  Lab 10/26/11 0510 10/25/11 0505 10/24/11 0630 10/23/11 2019  WBC 8.1 8.5 8.4 11.5*  NEUTROABS -- -- 5.6 8.2*  HGB 9.9* 10.6* 10.8* 11.8*  HCT 29.6* 31.4* 31.7* 34.1*  MCV 84.1 84.0 83.9 83.2  PLT 330 351 405* 432*   CBG:  Lab 10/28/11 0635 10/27/11 2209 10/27/11 1616 10/27/11 1116 10/27/11 0721  GLUCAP 156* 230* 204* 185* 123*    Time coordinating discharge: 35 minutes.   Signed:  [facility] 10/28/2011, 11:35 AM

## 2011-10-28 NOTE — Progress Notes (Signed)
**  Late entry for 10/27/11**   PT Progress Note:     10/27/11 1400  PT Visit Information  Last PT Received On 10/27/11  Assistance Needed +1  PT Time Calculation  PT Start Time 1010  PT Stop Time 1027  PT Time Calculation (min) 17 min  Precautions  Precautions Fall  Restrictions  Weight Bearing Restrictions Yes  RLE Weight Bearing NWB  Cognition  Overall Cognitive Status Appears within functional limits for tasks assessed/performed  Arousal/Alertness Awake/alert  Orientation Level Appears intact for tasks assessed;Oriented X4 / Intact  Behavior During Session [facility] for tasks performed  Bed Mobility  Bed Mobility Supine to Sit;Sit to Supine  Supine to Sit 7: Independent;HOB flat  Sit to Supine 7: Independent;HOB flat  Transfers  Transfers Sit to Stand;Stand to Sit  Sit to Stand 5: Supervision;With upper extremity assist;From bed;From toilet  Stand to Sit 5: Supervision;To bed;To toilet  Details for Transfer Assistance Cues for safest hand placement  Ambulation/Gait  Ambulation/Gait Assistance 6: Modified independent (Device/Increase time)  Ambulation Distance (Feet) 40 Feet  Assistive device Rolling walker  Ambulation/Gait Assistance Details Pt does very well with compliance to Moore RLE.    Gait Pattern Step-to pattern  General Gait Details hop to gait with NWB RLE   Stairs No  Wheelchair Mobility  Wheelchair Mobility No  Balance  Balance Assessed No  PT - End of Session  Equipment Utilized During Treatment Gait belt  Activity Tolerance Patient tolerated treatment well  Patient left in bed;with call bell/phone within reach;with family/visitor present  PT - Assessment/Plan  Comments on Treatment Session Pt continues to make steady progress with mobility.  Pt deferred reviewing of stairs this session- reports she & her husband feel comfortable with stairs.    PT Plan Discharge plan remains appropriate;Frequency remains appropriate  PT Frequency Min 3X/week  Follow Up  Recommendations No PT follow up  Equipment Recommended Rolling walker with 5" wheels  Acute Rehab PT Goals  Time For Goal Achievement 11/01/11  Potential to Achieve Goals Good  PT Goal: Ambulate - Progress Met  PT General Charges  $$ ACUTE PT VISIT 1 Procedure  PT Treatments  $Therapeutic Activity 8-22 mins     Sarajane Marek, [place] [phone] 10/28/2011

## 2011-10-28 NOTE — Progress Notes (Signed)
PT Cancel Note:   Pt deferring PT session due to waiting for d/c home.    Sarajane Marek, [place] [phone] 10/28/2011

## 2011-10-29 LAB — GLUCOSE, CAPILLARY: Glucose-Capillary: 250 mg/dL — ABNORMAL HIGH (ref 70–99)

## 2011-10-31 LAB — ANAEROBIC CULTURE

## 2012-12-15 ENCOUNTER — Encounter (HOSPITAL_COMMUNITY): Payer: Self-pay | Admitting: *Deleted

## 2012-12-15 ENCOUNTER — Other Ambulatory Visit (HOSPITAL_COMMUNITY): Payer: Self-pay | Admitting: Orthopedic Surgery

## 2012-12-15 NOTE — Progress Notes (Addendum)
Pt denies SOB, chest pain, and being under the care of a cardiologist. Pt states that she had both an echo 2.5 years ago and three cardiac catheterizations at [facility] in [place],  [place] by Dr. Moshe Cipro. Pt made aware to stop taking Aspirin and herbal medications. Do not take any NSAIDs ie: Ibuprofen, Advil, Naproxen or any medication containing Aspirin.

## 2012-12-16 ENCOUNTER — Ambulatory Visit (HOSPITAL_COMMUNITY): Payer: Medicaid Other

## 2012-12-16 ENCOUNTER — Encounter (HOSPITAL_COMMUNITY)
Admission: RE | Disposition: A | Discharge status: Home / Self Care | Payer: Self-pay | Source: Ambulatory Visit | Attending: Orthopedic Surgery

## 2012-12-16 ENCOUNTER — Observation Stay (HOSPITAL_COMMUNITY)
Admission: RE | Admit: 2012-12-16 | Discharge: 2012-12-17 | Disposition: A | Discharge status: Home / Self Care | Payer: Medicaid Other | Source: Ambulatory Visit | Attending: Orthopedic Surgery | Admitting: Orthopedic Surgery

## 2012-12-16 ENCOUNTER — Encounter (HOSPITAL_COMMUNITY): Payer: Self-pay | Admitting: Anesthesiology

## 2012-12-16 ENCOUNTER — Encounter (HOSPITAL_COMMUNITY): Payer: Self-pay | Admitting: *Deleted

## 2012-12-16 ENCOUNTER — Ambulatory Visit (HOSPITAL_COMMUNITY): Payer: Medicaid Other | Admitting: Anesthesiology

## 2012-12-16 DIAGNOSIS — I10 Essential (primary) hypertension: Secondary | ICD-10-CM | POA: Insufficient documentation

## 2012-12-16 DIAGNOSIS — E119 Type 2 diabetes mellitus without complications: Secondary | ICD-10-CM

## 2012-12-16 DIAGNOSIS — Z794 Long term (current) use of insulin: Secondary | ICD-10-CM | POA: Insufficient documentation

## 2012-12-16 DIAGNOSIS — I798 Other disorders of arteries, arterioles and capillaries in diseases classified elsewhere: Secondary | ICD-10-CM | POA: Insufficient documentation

## 2012-12-16 DIAGNOSIS — E1159 Type 2 diabetes mellitus with other circulatory complications: Secondary | ICD-10-CM | POA: Insufficient documentation

## 2012-12-16 DIAGNOSIS — Z79899 Other long term (current) drug therapy: Secondary | ICD-10-CM | POA: Insufficient documentation

## 2012-12-16 DIAGNOSIS — M9080 Osteopathy in diseases classified elsewhere, unspecified site: Secondary | ICD-10-CM | POA: Insufficient documentation

## 2012-12-16 DIAGNOSIS — E1169 Type 2 diabetes mellitus with other specified complication: Principal | ICD-10-CM | POA: Insufficient documentation

## 2012-12-16 DIAGNOSIS — E1149 Type 2 diabetes mellitus with other diabetic neurological complication: Secondary | ICD-10-CM | POA: Insufficient documentation

## 2012-12-16 DIAGNOSIS — E1142 Type 2 diabetes mellitus with diabetic polyneuropathy: Secondary | ICD-10-CM | POA: Insufficient documentation

## 2012-12-16 DIAGNOSIS — L03119 Cellulitis of unspecified part of limb: Secondary | ICD-10-CM

## 2012-12-16 DIAGNOSIS — M869 Osteomyelitis, unspecified: Secondary | ICD-10-CM | POA: Insufficient documentation

## 2012-12-16 HISTORY — PX: AMPUTATION: SHX166

## 2012-12-16 HISTORY — DX: Paresthesia of skin: R20.0

## 2012-12-16 HISTORY — DX: Personal history of other diseases of the digestive system: Z87.19

## 2012-12-16 HISTORY — DX: Anesthesia of skin: R20.0

## 2012-12-16 HISTORY — DX: Paresthesia of skin: R20.2

## 2012-12-16 LAB — GLUCOSE, CAPILLARY
Glucose-Capillary: 369 mg/dL — ABNORMAL HIGH (ref 70–99)
Glucose-Capillary: 395 mg/dL — ABNORMAL HIGH (ref 70–99)
Glucose-Capillary: 284 mg/dL — ABNORMAL HIGH (ref 70–99)
Glucose-Capillary: 267 mg/dL — ABNORMAL HIGH (ref 70–99)

## 2012-12-16 LAB — CBC
HCT: 36.5 % (ref 36.0–46.0)
Hemoglobin: 12.4 g/dL (ref 12.0–15.0)
MCH: 28 pg (ref 26.0–34.0)
MCV: 82.4 fL (ref 78.0–100.0)
Platelets: 317 10*3/uL (ref 150–400)
RBC: 4.43 MIL/uL (ref 3.87–5.11)

## 2012-12-16 LAB — COMPREHENSIVE METABOLIC PANEL
BUN: 15 mg/dL (ref 6–23)
CO2: 28 mEq/L (ref 19–32)
Calcium: 9.3 mg/dL (ref 8.4–10.5)
Chloride: 96 mEq/L (ref 96–112)
Creatinine, Ser: 0.5 mg/dL (ref 0.50–1.10)
GFR calc Af Amer: >90 mL/min (ref >90)
GFR calc non Af Amer: >90 mL/min (ref >90)
Glucose, Bld: 415 mg/dL — ABNORMAL HIGH (ref 70–99)
Total Bilirubin: 0.4 mg/dL (ref 0.3–1.2)

## 2012-12-16 LAB — PROTIME-INR
INR: 0.95 (ref 0.00–1.49)
Prothrombin Time: 12.5 seconds (ref 11.6–15.2)

## 2012-12-16 SURGERY — AMPUTATION, FOOT, PARTIAL
Anesthesia: Monitor Anesthesia Care | Site: Foot | Laterality: Right | Wound class: Dirty or Infected

## 2012-12-16 MED ORDER — 0.9 % SODIUM CHLORIDE (POUR BTL) OPTIME
TOPICAL | Status: DC | PRN
  Administered 2012-12-16: 1000 mL

## 2012-12-16 MED ORDER — MIDAZOLAM HCL 2 MG/2ML IJ SOLN
INTRAMUSCULAR | Status: AC
  Filled 2012-12-16: qty 2

## 2012-12-16 MED ORDER — LACTATED RINGERS IV SOLN
INTRAVENOUS | Status: DC
  Administered 2012-12-16: 12:00:00 via INTRAVENOUS

## 2012-12-16 MED ORDER — FENTANYL CITRATE 0.05 MG/ML IJ SOLN
INTRAMUSCULAR | Status: AC
  Administered 2012-12-16: 50 ug via INTRAVENOUS
  Filled 2012-12-16: qty 2

## 2012-12-16 MED ORDER — LITHIUM CARBONATE 300 MG PO CAPS
300.0000 mg | ORAL_CAPSULE | Freq: Every day | ORAL | Status: DC
  Administered 2012-12-16: 300 mg via ORAL
  Filled 2012-12-16 (×2): qty 1

## 2012-12-16 MED ORDER — ESCITALOPRAM OXALATE 5 MG PO TABS
5.0000 mg | ORAL_TABLET | Freq: Every day | ORAL | Status: DC
  Administered 2012-12-16 – 2012-12-17 (×2): 5 mg via ORAL
  Filled 2012-12-16 (×2): qty 1

## 2012-12-16 MED ORDER — HYDROCODONE-ACETAMINOPHEN 5-325 MG PO TABS
1.0000 | ORAL_TABLET | ORAL | Status: DC | PRN
  Administered 2012-12-16: 1 via ORAL
  Administered 2012-12-16 – 2012-12-17 (×2): 2 via ORAL
  Filled 2012-12-16 (×2): qty 2

## 2012-12-16 MED ORDER — ONDANSETRON HCL 4 MG/2ML IJ SOLN: 4.0000 mg | Freq: Once | INTRAMUSCULAR | Status: DC | PRN

## 2012-12-16 MED ORDER — CEFAZOLIN SODIUM-DEXTROSE 2-3 GM-% IV SOLR
2.0000 g | INTRAVENOUS | Status: AC
  Administered 2012-12-16: 2 g via INTRAVENOUS
  Filled 2012-12-16: qty 50

## 2012-12-16 MED ORDER — CEFAZOLIN SODIUM 1-5 GM-% IV SOLN
INTRAVENOUS | Status: AC
  Administered 2012-12-17: 1000 mg
  Filled 2012-12-16: qty 50

## 2012-12-16 MED ORDER — INSULIN ASPART 100 UNIT/ML ~~LOC~~ SOLN
10.0000 [IU] | Freq: Once | SUBCUTANEOUS | Status: AC
  Administered 2012-12-16: 10 [IU] via SUBCUTANEOUS

## 2012-12-16 MED ORDER — HYDROCODONE-ACETAMINOPHEN 5-325 MG PO TABS
ORAL_TABLET | ORAL | Status: AC
  Filled 2012-12-16: qty 1

## 2012-12-16 MED ORDER — METOCLOPRAMIDE HCL 5 MG PO TABS: 5.0000 mg | ORAL_TABLET | Freq: Three times a day (TID) | ORAL | Status: DC | PRN

## 2012-12-16 MED ORDER — HYDROMORPHONE HCL PF 1 MG/ML IJ SOLN: 0.2500 mg | INTRAMUSCULAR | Status: DC | PRN

## 2012-12-16 MED ORDER — FENTANYL CITRATE 0.05 MG/ML IJ SOLN
INTRAMUSCULAR | Status: DC | PRN
  Administered 2012-12-16 (×2): 50 ug via INTRAVENOUS

## 2012-12-16 MED ORDER — LITHIUM CARBONATE 300 MG PO CAPS
600.0000 mg | ORAL_CAPSULE | Freq: Two times a day (BID) | ORAL | Status: DC
  Administered 2012-12-16 – 2012-12-17 (×2): 600 mg via ORAL
  Filled 2012-12-16 (×3): qty 2

## 2012-12-16 MED ORDER — LACTATED RINGERS IV SOLN: INTRAVENOUS | Status: DC

## 2012-12-16 MED ORDER — HYDROMORPHONE HCL PF 1 MG/ML IJ SOLN
0.5000 mg | INTRAMUSCULAR | Status: DC | PRN
  Administered 2012-12-16 – 2012-12-17 (×2): 1 mg via INTRAVENOUS
  Filled 2012-12-16 (×3): qty 1

## 2012-12-16 MED ORDER — ASPIRIN EC 325 MG PO TBEC
325.0000 mg | DELAYED_RELEASE_TABLET | Freq: Every day | ORAL | Status: DC
  Administered 2012-12-16 – 2012-12-17 (×2): 325 mg via ORAL
  Filled 2012-12-16 (×2): qty 1

## 2012-12-16 MED ORDER — ONDANSETRON HCL 4 MG/2ML IJ SOLN: 4.0000 mg | Freq: Four times a day (QID) | INTRAMUSCULAR | Status: DC | PRN

## 2012-12-16 MED ORDER — HYDROMORPHONE HCL PF 1 MG/ML IJ SOLN
INTRAMUSCULAR | Status: AC
  Filled 2012-12-16: qty 1

## 2012-12-16 MED ORDER — LACTATED RINGERS IV SOLN
INTRAVENOUS | Status: DC | PRN
  Administered 2012-12-16: 13:00:00 via INTRAVENOUS

## 2012-12-16 MED ORDER — METOCLOPRAMIDE HCL 5 MG/ML IJ SOLN: 5.0000 mg | Freq: Three times a day (TID) | INTRAMUSCULAR | Status: DC | PRN

## 2012-12-16 MED ORDER — ONDANSETRON HCL 4 MG PO TABS
4.0000 mg | ORAL_TABLET | Freq: Four times a day (QID) | ORAL | Status: DC | PRN
  Administered 2012-12-17: 4 mg via ORAL
  Filled 2012-12-16: qty 1

## 2012-12-16 MED ORDER — INSULIN ASPART 100 UNIT/ML ~~LOC~~ SOLN
0.0000 [IU] | Freq: Three times a day (TID) | SUBCUTANEOUS | Status: DC
  Administered 2012-12-17: 8 [IU] via SUBCUTANEOUS
  Administered 2012-12-17: 11 [IU] via SUBCUTANEOUS

## 2012-12-16 MED ORDER — INSULIN ASPART 100 UNIT/ML ~~LOC~~ SOLN
3.0000 [IU] | Freq: Three times a day (TID) | SUBCUTANEOUS | Status: DC
  Administered 2012-12-17 (×2): 3 [IU] via SUBCUTANEOUS

## 2012-12-16 MED ORDER — CEFAZOLIN SODIUM 1-5 GM-% IV SOLN
1.0000 g | Freq: Four times a day (QID) | INTRAVENOUS | Status: AC
  Administered 2012-12-16 – 2012-12-17 (×3): 1 g via INTRAVENOUS
  Filled 2012-12-16 (×3): qty 50

## 2012-12-16 MED ORDER — INSULIN ASPART 100 UNIT/ML ~~LOC~~ SOLN
SUBCUTANEOUS | Status: AC
  Filled 2012-12-16: qty 1

## 2012-12-16 MED ORDER — SODIUM CHLORIDE 0.9 % IV SOLN
INTRAVENOUS | Status: DC
  Administered 2012-12-16: 16:00:00 via INTRAVENOUS

## 2012-12-16 SURGICAL SUPPLY — 33 items
BANDAGE GAUZE ELAST BULKY 4 IN (GAUZE/BANDAGES/DRESSINGS) ×3 IMPLANT
BLADE SAW SGTL HD 18.5X60.5X1. (BLADE) ×2 IMPLANT
BNDG COHESIVE 4X5 TAN STRL (GAUZE/BANDAGES/DRESSINGS) ×4 IMPLANT
BNDG COHESIVE 6X5 TAN STRL LF (GAUZE/BANDAGES/DRESSINGS) ×1 IMPLANT
CLOTH BEACON ORANGE TIMEOUT ST (SAFETY) ×2 IMPLANT
COVER SURGICAL LIGHT HANDLE (MISCELLANEOUS) ×2 IMPLANT
DRAPE U-SHAPE 47X51 STRL (DRAPES) ×2 IMPLANT
DRSG ADAPTIC 3X8 NADH LF (GAUZE/BANDAGES/DRESSINGS) ×2 IMPLANT
DRSG PAD ABDOMINAL 8X10 ST (GAUZE/BANDAGES/DRESSINGS) ×1 IMPLANT
DURAPREP 26ML APPLICATOR (WOUND CARE) ×2 IMPLANT
ELECT REM PT RETURN 9FT ADLT (ELECTROSURGICAL) ×2
ELECTRODE REM PT RTRN 9FT ADLT (ELECTROSURGICAL) ×1 IMPLANT
GLOVE BIOGEL PI IND STRL 9 (GLOVE) ×1 IMPLANT
GLOVE BIOGEL PI INDICATOR 9 (GLOVE) ×1
GOWN STRL NON-REIN LRG LVL3 (GOWN DISPOSABLE) ×1 IMPLANT
KIT BASIN OR (CUSTOM PROCEDURE TRAY) ×2 IMPLANT
KIT ROOM TURNOVER OR (KITS) ×2 IMPLANT
MANIFOLD NEPTUNE II (INSTRUMENTS) ×2 IMPLANT
NS IRRIG 1000ML POUR BTL (IV SOLUTION) ×2 IMPLANT
PACK ORTHO EXTREMITY (CUSTOM PROCEDURE TRAY) ×2 IMPLANT
PAD ARMBOARD 7.5X6 YLW CONV (MISCELLANEOUS) ×4 IMPLANT
PAD CAST 4YDX4 CTTN HI CHSV (CAST SUPPLIES) ×1 IMPLANT
PADDING CAST COTTON 4X4 STRL (CAST SUPPLIES) ×2
SPONGE GAUZE 4X4 12PLY (GAUZE/BANDAGES/DRESSINGS) ×2 IMPLANT
SPONGE LAP 18X18 X RAY DECT (DISPOSABLE) ×3 IMPLANT
STAPLER VISISTAT 35W (STAPLE) ×2 IMPLANT
SUT ETHILON 2 0 PSLX (SUTURE) ×6 IMPLANT
SUT VIC AB 2-0 CTB1 (SUTURE) ×4 IMPLANT
TOWEL OR 17X24 6PK STRL BLUE (TOWEL DISPOSABLE) ×2 IMPLANT
TOWEL OR 17X26 10 PK STRL BLUE (TOWEL DISPOSABLE) ×2 IMPLANT
TUBE CONNECTING 12X1/4 (SUCTIONS) ×2 IMPLANT
WATER STERILE IRR 1000ML POUR (IV SOLUTION) ×2 IMPLANT
YANKAUER SUCT BULB TIP NO VENT (SUCTIONS) ×2 IMPLANT

## 2012-12-16 NOTE — Transfer of Care (Signed)
Immediate Anesthesia Transfer of Care Note  Patient: Dana [name]  Procedure(s) Performed: Procedure(s): Right Foot Transmetatarsal Amputation (Right)  Patient Location: PACU  Anesthesia Type:MAC and Regional  Level of Consciousness: awake, alert , oriented and sedated  Airway & Oxygen Therapy: Patient Spontanous Breathing and Patient connected to nasal cannula oxygen  Post-op Assessment: Report given to PACU RN, Post -op Vital signs reviewed and stable and Patient moving all extremities  Post vital signs: Reviewed and stable  Complications: No apparent anesthesia complications

## 2012-12-16 NOTE — H&P (Signed)
Dana [name] is an 57 y.o. female.   Chief Complaint: Abscess osteomyelitis right forefoot. HPI: Patient is a 57 year old woman with diabetes peripheral vascular disease insensate neuropathy who is status post a forefoot surgery for foot salvage surgery. Patient has developed progressive osteomyelitis and ulceration to the forefoot. Do to almost a complete involvement of the forefoot patient presents at this time for transmetatarsal amputation.   Past Medical History  Diagnosis Date  . Diabetes mellitus   . Bipolar 1 disorder   . Hypertension     past hx of  . H/O hiatal hernia   . Numbness and tingling     Hx; of in B/LLE and B/LUE    Past Surgical History  Procedure Laterality Date  . Breast surgery    . Abdominal hysterectomy    . Colon surgery    . Nasal septum surgery    . Amputation  10/24/2011    Procedure: AMPUTATION RAY;  Surgeon: Newt Minion, MD;  Location: [facility];  Service: Orthopedics;  Laterality: Right;  Right Foot 3rd Ray Amputation  . Tonsillectomy    . Adenoidectomy      Hx: of    Family History  Problem Relation Age of Onset  . Diabetes Mother    Social History:  reports that she has never smoked. She has never used smokeless tobacco. She reports that she does not drink alcohol or use illicit drugs.  Allergies:  Allergies  Allergen Reactions  . Ativan (Lorazepam) Other (See Comments)    Abnormal behavior  . Darvocet (Propoxyphene-Acetaminophen) Itching  . Demerol (Meperidine) Other (See Comments)    Abnormal behavior  . Latex Itching  . Oxycodone Other (See Comments)    Abnormal behavior    No prescriptions prior to admission    No results found for this or any previous visit (from the past 48 hour(s)). No results found.  Review of Systems  All other systems reviewed and are negative.    Height 5\' 1"  (1.549 m), weight 73.483 kg (162 lb). Physical Exam  On examination patient has palpable pulses. There is osteomyelitis abscess ulceration  to the remainder of her forefoot. Assessment/Plan Assessment: Osteomyelitis abscess ulceration with diabetic insensate neuropathy and peripheral vascular disease involving the right forefoot.  Plan: Will plan for a right transmetatarsal amputation. Risks and benefits were discussed including infection neurovascular injury nonhealing of the wound need for additional surgery. Patient states he understands was pursued this time.  DUDA,MARCUS V 12/16/2012, 6:48 AM

## 2012-12-16 NOTE — Anesthesia Postprocedure Evaluation (Signed)
  Anesthesia Post-op Note  Patient: Dana [name]  Procedure(s) Performed: Procedure(s): Right Foot Transmetatarsal Amputation (Right)  Patient Location: PACU  Anesthesia Type:Regional  Level of Consciousness: awake, alert , oriented and patient cooperative  Airway and Oxygen Therapy: Patient Spontanous Breathing  Post-op Pain: none  Post-op Assessment: Post-op Vital signs reviewed, Patient's Cardiovascular Status Stable, Respiratory Function Stable, Patent Airway, No signs of Nausea or vomiting and Pain level controlled  Post-op Vital Signs: stable  Complications: No apparent anesthesia complications

## 2012-12-16 NOTE — Anesthesia Procedure Notes (Signed)
Anesthesia Regional Block:  Ankle block  Pre-Anesthetic Checklist: ,, timeout performed, Correct Patient, Correct Site, Correct Laterality, Correct Procedure, Correct Position, site marked, Risks and benefits discussed,  Surgical consent,  Pre-op evaluation,  At surgeon's request and post-op pain management  Laterality: Right  Prep: Maximum Sterile Barrier Precautions used, chloraprep and alcohol swabs       Needles:  Injection technique: Single-shot      Needle Gauge: 25 and 25 G    Additional Needles: Ankle block Narrative:  Start time: 12/16/2012 12:25 PM End time: 12/16/2012 12:30 PM  Performed by: Personally  Anesthesiologist: Sharolyn Douglas MD  Additional Notes: Pt accepts procedure and risks. 20cc 0.5% Marcaine and 20cc 2% Lidocaine Ant/ Post Tibial and Sural Nerves w/o difficulty. GES

## 2012-12-16 NOTE — Progress Notes (Signed)
Care of pt assumed by MA [place] Car[place] RN from [place] who has been caregiver.

## 2012-12-16 NOTE — Preoperative (Signed)
Beta Blockers   Reason not to administer Beta Blockers:Not Applicable 

## 2012-12-16 NOTE — Progress Notes (Signed)
Orthopedic Tech Progress Note Patient Details:  Dana [name] 13-Mar-1956 [id number]  Ortho Devices Type of Ortho Device: Postop shoe/boot Ortho Device/Splint Location: RLE Ortho Device/Splint Interventions: Ordered;Application   Braulio Bosch 12/16/2012, 3:57 PM

## 2012-12-16 NOTE — Anesthesia Preprocedure Evaluation (Signed)
Anesthesia Evaluation  Patient identified by MRN, date of birth, ID band Patient awake    Reviewed: Allergy & Precautions, H&P , NPO status , Patient's Chart, lab work & pertinent test results  Airway  TM Distance: >3 FB Neck ROM: full    Dental   Pulmonary          Cardiovascular hypertension, + Peripheral Vascular Disease Rhythm:regular Rate:Normal     Neuro/Psych PSYCHIATRIC DISORDERS    GI/Hepatic hiatal hernia,   Endo/Other  diabetes, Insulin Dependent  Renal/GU      Musculoskeletal   Abdominal   Peds  Hematology   Anesthesia Other Findings   Reproductive/Obstetrics                           Anesthesia Physical Anesthesia Plan  ASA: III  Anesthesia Plan: Regional and MAC   Post-op Pain Management:    Induction: Intravenous  Airway Management Planned: Mask  Additional Equipment:   Intra-op Plan:   Post-operative Plan:   Informed Consent: I have reviewed the patients History and Physical, chart, labs and discussed the procedure including the risks, benefits and alternatives for the proposed anesthesia with the patient or authorized representative who has indicated his/her understanding and acceptance.     Plan Discussed with: CRNA, Anesthesiologist and Surgeon  Anesthesia Plan Comments:         Anesthesia Quick Evaluation

## 2012-12-16 NOTE — Op Note (Signed)
OPERATIVE REPORT  DATE OF SURGERY: 12/16/2012  PATIENT:  Dana [name],  57 y.o. female  PRE-OPERATIVE DIAGNOSIS:  Osteomyelitis 1st and 5th MT Right Foot  POST-OPERATIVE DIAGNOSIS:  Osteomyelitis 1st and 5th MT Right Foot  PROCEDURE:  Procedure(s): Right Foot Transmetatarsal Amputation  SURGEON:  Surgeon(s): Newt Minion, MD  ANESTHESIA:   regional  EBL:  min ML  SPECIMEN:  Source of Specimen:  Right forefoot  TOURNIQUET:  * No tourniquets in log *  PROCEDURE DETAILS: Patient is a 57 year old woman with severe peripheral vascular disease who is status post foot salvage surgery who presents at this time with recurrent osteomyelitis ulceration over the forefoot. Patient presents at this time after failure conservative therapy for transmetatarsal amputation. Risks and benefits were discussed including nonhealing of the wound need for higher level amputation. Patient states she understands and wished to proceed at this time. Description of procedure patient brought to the operating room after undergoing an ankle block. After adequate levels of anesthesia were obtained patient's right lower extremity was prepped using DuraPrep and draped into a sterile field. A fishmouth incision was made just proximal to the ulcerations on the plantar aspect of the right foot. Transmetatarsal amputation was performed with an oscillating saw electrocautery was used for hemostasis the wound was irrigated with normal saline there is no deep abscess no purulence tissue was healthy viable she had significant calcification of her vessels. The incision was closed using 2-0 nylon. The wound was covered Adaptic orthopedic sponges AB dressing Kerlix and Coban. Patient was was taken to the PACU in stable condition.  PLAN OF CARE: Admit for overnight observation  PATIENT DISPOSITION:  PACU - hemodynamically stable.   Newt Minion, MD 12/16/2012 1:54 PM

## 2012-12-17 LAB — GLUCOSE, CAPILLARY: Glucose-Capillary: 328 mg/dL — ABNORMAL HIGH (ref 70–99)

## 2012-12-17 MED ORDER — HYDROCODONE-ACETAMINOPHEN 5-325 MG PO TABS: 1.0000 | ORAL_TABLET | ORAL | Status: DC | PRN

## 2012-12-17 NOTE — Progress Notes (Signed)
Talked to patient with spouse present about DCP; lives with spouse, has a rolling walker, patient does not want any home health care services at this time. Patient stated " I can manage well at home with my husband's help". Mindi Slicker RN,BSN,MHA

## 2012-12-17 NOTE — Progress Notes (Signed)
Physical Therapy Evaluation Patient Details Name: Dana [name] MRN: [id number] DOB: 1955/08/21 Today's Date: 12/17/2012 Time: 1225-1251 PT Time Calculation (min): 26 min  PT Assessment / Plan / Recommendation History of Present Illness  Pt had amp of right 3rd toe and now presents with osteomyelitis for transmet amp.  Clinical Impression  Pt is able to mobilize at mod I level while keeping TDWB with RW. This will work well for her for short distances but recommend knee walker for longer distances, she has used one before and is familiar with it. No further acute or f/u PT needs at this time.    PT Assessment  Patent does not need any further PT services    Follow Up Recommendations  No PT follow up;Supervision - Intermittent    Does the patient have the potential to tolerate intense rehabilitation      Barriers to Discharge        Equipment Recommendations  Other (comment) (knee walker)    Recommendations for Other Services     Frequency      Precautions / Restrictions Precautions Precautions: None Required Braces or Orthoses: Other Brace/Splint Other Brace/Splint: post op shoe right Restrictions Weight Bearing Restrictions: Yes RLE Weight Bearing: Touchdown weight bearing   Pertinent Vitals/Pain 7/10 right foot, RN made aware      Mobility  Bed Mobility Bed Mobility: Supine to Sit;Sit to Supine Supine to Sit: 7: Independent Sit to Supine: 7: Independent Transfers Transfers: Sit to Stand;Stand to Sit Sit to Stand: 7: Independent;From bed Stand to Sit: 7: Independent;To bed Ambulation/Gait Ambulation/Gait Assistance: 6: Modified independent (Device/Increase time) Ambulation Distance (Feet): 30 Feet Assistive device: Rolling walker Ambulation/Gait Assistance Details: pt able to ambulate with RW with RLE NWB as well as TDWB. RW will be sufficient for short distances but knee walker would be better option for her for longer distances Gait Pattern: Step-to  pattern Gait velocity: decreased Stairs: No Wheelchair Mobility Wheelchair Mobility: No    Exercises General Exercises - Lower Extremity Ankle Circles/Pumps: AROM;Both;10 reps;Supine   PT Diagnosis:    PT Problem List:   PT Treatment Interventions:       PT Goals(Current goals can be found in the care plan section) Acute Rehab PT Goals Patient Stated Goal: return home PT Goal Formulation: No goals set, d/c therapy  Visit Information  Last PT Received On: 12/17/12 Assistance Needed: +1 History of Present Illness: Pt had amp of right 3rd toe and now presents with osteomyelitis for transmet amp.       Prior [place] expects to be discharged to:: Private residence Living Arrangements: Spouse/significant other Available Help at Discharge: Family;Available 24 hours/day Type of Home: House Home Access: Stairs to enter [employer] of Steps: 3 Entrance Stairs-Rails: Right Home Layout: One level Home Equipment: Walker - 2 wheels;Tub bench;Grab bars - toilet Additional Comments: pt's husband also on disability so house well-equipped. Pt used knee walker after last amp and did well with it, would like to rent one again Prior Function Level of Independence: Independent Comments: pt drives, does not work, on disability Communication Communication: No difficulties    Cognition  Cognition Arousal/Alertness: Awake/alert Behavior During Therapy: WFL for tasks assessed/performed Overall Cognitive Status: Within Functional Limits for tasks assessed    Extremity/Trunk Assessment Upper Extremity Assessment Upper Extremity Assessment: Overall WFL for tasks assessed Lower Extremity Assessment Lower Extremity Assessment: Overall WFL for tasks assessed Cervical / Trunk Assessment Cervical / Trunk Assessment: Normal   Balance Balance Balance  Assessed: Yes Dynamic Standing Balance Dynamic Standing - Balance Support: No upper extremity  supported;During functional activity Dynamic Standing - Level of Assistance: 6: Modified independent (Device/Increase time)  End of Session PT - End of Session Equipment Utilized During Treatment: Gait belt Activity Tolerance: Patient tolerated treatment well Patient left: in bed;with call bell/phone within reach;with family/visitor present Nurse Communication: Patient requests pain meds  GP Functional Assessment Tool Used: clinical judgement Functional Limitation: Mobility: Walking and moving around Mobility: Walking and Moving Around Current Status [id number]): 0 percent impaired, limited or restricted Mobility: Walking and Moving Around Goal Status [id number]): 0 percent impaired, limited or restricted Mobility: Walking and Moving Around Discharge Status [phone]): 0 percent impaired, limited or restricted  Leighton Roach, PT  Acute Rehab Services  [phone]  Leighton Roach 12/17/2012, 1:44 PM

## 2012-12-17 NOTE — Discharge Summary (Signed)
Physician Discharge Summary  Patient ID: Dana [name] MRN: [id number] DOB/AGE: 07-06-1955 57 y.o.  Admit date: 12/16/2012 Discharge date: 12/17/2012  Admission Diagnoses: Right foot osteomyelitis and ulceration  Discharge Diagnoses: Right foot osteomyelitis and ulceration Active Problems:   * No active hospital problems. *   Discharged Condition: stable  Hospital Course: Patient's hospital course was essentially remarkable. She underwent a transmetatarsal amputation. She progressed well and was discharged to home in stable condition.  Consults: None  Significant Diagnostic Studies: labs: Routine labs  Treatments: surgery: See operative note  Discharge Exam: Blood pressure 111/74, pulse 82, temperature 97.6 F (36.4 C), temperature source Oral, resp. rate 18, height 5\' 1"  (1.549 m), weight 73.483 kg (162 lb), SpO2 98.00%. Incision/Wound: dressing clean dry and intact  Disposition: 01-Home or Self Care  Discharge Orders   Future Orders Complete By Expires     Call MD / Call 911  As directed     Comments:      If you experience chest pain or shortness of breath, CALL 911 and be transported to the hospital emergency room.  If you develope a fever above 101 F, pus (white drainage) or increased drainage or redness at the wound, or calf pain, call your surgeon's office.    Constipation Prevention  As directed     Comments:      Drink plenty of fluids.  Prune juice may be helpful.  You may use a stool softener, such as Colace (over the counter) 100 mg twice a day.  Use MiraLax (over the counter) for constipation as needed.    Diet - low sodium heart healthy  As directed     Increase activity slowly as tolerated  As directed         Medication List         doxycycline 100 MG tablet  Commonly known as:  VIBRA-TABS  Take 100 mg by mouth 2 (two) times daily. For 30 days     escitalopram 5 MG tablet  Commonly known as:  LEXAPRO  Take 5 mg by mouth every morning.     HYDROcodone-acetaminophen 5-325 MG per tablet  Commonly known as:  NORCO/VICODIN  Take 1-2 tablets by mouth every 4 (four) hours as needed.     lithium carbonate 300 MG capsule  Take 300-600 mg by mouth 3 (three) times daily with meals. Takes 2 in the am, 1 at dinner & 2 at night           Follow-up Information   Follow up with Teala Daffron V, MD In 1 week.   Contact information:   [place] [address] [phone]       Signed: Newt Minion 12/17/2012, 8:08 AM

## 2012-12-20 ENCOUNTER — Encounter (HOSPITAL_COMMUNITY): Payer: Self-pay | Admitting: Orthopedic Surgery

## 2014-10-30 ENCOUNTER — Telehealth: Payer: Self-pay

## 2014-10-30 NOTE — Telephone Encounter (Addendum)
Relation to pt: self Call back number: [phone]   Reason for call:   Pt has new patient appointment 11/08/2014, requesting  metformin RX,advised pt since she has never been seen she would have to request RX from her previous provider. Pt states she has not seen previous provider in a long time. Pt will call back with pharmacy information Please advise

## 2014-10-30 NOTE — Telephone Encounter (Signed)
Will not be refilled until her New Patient appointment.  We do not refill medications until patient has been established.  She can be scheduled for a 15 minute appointment prior to her new patient appointment for diabetes. She can also move up her NP appointment.

## 2014-10-31 NOTE — Telephone Encounter (Signed)
Suggested earlier appointment pt denied and kept her new patient appointment for 11/08/14

## 2014-11-07 ENCOUNTER — Telehealth: Payer: Self-pay | Admitting: *Deleted

## 2014-11-07 NOTE — Telephone Encounter (Signed)
Unable to reach patient at time of Pre-Visit Call.  Left message for patient to return call when available.    

## 2014-11-08 ENCOUNTER — Telehealth: Payer: Self-pay

## 2014-11-08 ENCOUNTER — Encounter: Payer: Self-pay | Admitting: Physician Assistant

## 2014-11-08 ENCOUNTER — Ambulatory Visit (INDEPENDENT_AMBULATORY_CARE_PROVIDER_SITE_OTHER): Payer: PPO | Admitting: Physician Assistant

## 2014-11-08 VITALS — BP 115/71 | HR 79 | Temp 98.1°F | Ht 61.0 in | Wt 167.0 lb

## 2014-11-08 DIAGNOSIS — IMO0002 Reserved for concepts with insufficient information to code with codable children: Secondary | ICD-10-CM | POA: Insufficient documentation

## 2014-11-08 DIAGNOSIS — E1165 Type 2 diabetes mellitus with hyperglycemia: Secondary | ICD-10-CM

## 2014-11-08 DIAGNOSIS — F319 Bipolar disorder, unspecified: Secondary | ICD-10-CM

## 2014-11-08 DIAGNOSIS — Z89431 Acquired absence of right foot: Secondary | ICD-10-CM

## 2014-11-08 DIAGNOSIS — E1142 Type 2 diabetes mellitus with diabetic polyneuropathy: Secondary | ICD-10-CM | POA: Insufficient documentation

## 2014-11-08 DIAGNOSIS — E1139 Type 2 diabetes mellitus with other diabetic ophthalmic complication: Secondary | ICD-10-CM

## 2014-11-08 DIAGNOSIS — E785 Hyperlipidemia, unspecified: Secondary | ICD-10-CM | POA: Insufficient documentation

## 2014-11-08 DIAGNOSIS — Z89439 Acquired absence of unspecified foot: Secondary | ICD-10-CM | POA: Insufficient documentation

## 2014-11-08 LAB — URINALYSIS, ROUTINE W REFLEX MICROSCOPIC
BILIRUBIN URINE: NEGATIVE
KETONES UR: NEGATIVE
Leukocytes, UA: NEGATIVE
Nitrite: NEGATIVE
Specific Gravity, Urine: 1.025 (ref 1.000–1.030)
URINE GLUCOSE: 500 — AB
UROBILINOGEN UA: 2 — AB (ref 0.0–1.0)
pH: 6 (ref 5.0–8.0)

## 2014-11-08 LAB — COMPREHENSIVE METABOLIC PANEL
ALK PHOS: 111 U/L (ref 39–117)
ALT: 11 U/L (ref 0–35)
AST: 12 U/L (ref 0–37)
Albumin: 3.6 g/dL (ref 3.5–5.2)
BILIRUBIN TOTAL: 0.5 mg/dL (ref 0.2–1.2)
BUN: 15 mg/dL (ref 6–23)
CALCIUM: 9.7 mg/dL (ref 8.4–10.5)
CHLORIDE: 95 meq/L — AB (ref 96–112)
CO2: 32 mEq/L (ref 19–32)
CREATININE: 0.81 mg/dL (ref 0.40–1.20)
GFR: 77.24 mL/min (ref >60.00)
Glucose, Bld: 394 mg/dL — ABNORMAL HIGH (ref 70–99)
Potassium: 4.1 mEq/L (ref 3.5–5.1)
Sodium: 133 mEq/L — ABNORMAL LOW (ref 135–145)
Total Protein: 8.1 g/dL (ref 6.0–8.3)

## 2014-11-08 LAB — MICROALBUMIN / CREATININE URINE RATIO
CREATININE, U: 191.9 mg/dL
MICROALB UR: 4 mg/dL — AB (ref 0.0–1.9)
MICROALB/CREAT RATIO: 2.1 mg/g (ref 0.0–30.0)

## 2014-11-08 LAB — HEMOGLOBIN A1C: Hgb A1c MFr Bld: 10.1 % — ABNORMAL HIGH (ref 4.6–6.5)

## 2014-11-08 MED ORDER — INSULIN DETEMIR 100 UNIT/ML FLEXPEN: PEN_INJECTOR | SUBCUTANEOUS | Status: DC

## 2014-11-08 MED ORDER — METFORMIN HCL 500 MG PO TABS: 500.0000 mg | ORAL_TABLET | Freq: Two times a day (BID) | ORAL | Status: DC

## 2014-11-08 MED ORDER — GABAPENTIN 300 MG PO CAPS
600.0000 mg | ORAL_CAPSULE | Freq: Three times a day (TID) | ORAL | Status: DC
Start: 2014-11-08 — End: 2015-02-21

## 2014-11-08 MED ORDER — ATORVASTATIN CALCIUM 20 MG PO TABS: 20.0000 mg | ORAL_TABLET | Freq: Every day | ORAL | Status: DC

## 2014-11-08 NOTE — Telephone Encounter (Signed)
Pharmacy called requesting an order clarification on following medication:   Insulin Detemir (LEVEMIR FLEXTOUCH) 100 UNIT/ML Pen 15 mL 5 11/08/2014      Sig: Increase by 2 units every 3-4 days until Am glucose 80-130    E-Prescribing Status: Receipt confirmed by pharmacy (11/08/2014 9:17 AM EDT)    Pharmacy needs to know starting dose.    Per office note:   Pt is to increase Levemir to 24 units each evening. Every 3-4 nights, increase your Levemir by 2 units until your fasting sugar level is 80-130 consistently. Stop at 34 units max and call me before making any further changes.  The same was relayed to pharmacist as well as updated on pt's medication list.

## 2014-11-08 NOTE — Progress Notes (Signed)
Patient presents to clinic today to establish care.  Patient with history of Diabetes Mellitus II, uncontrolled with retinopathy and neuropathy. Was being treated by previous PMD. Is not followed by Endocrinology. Has upcoming appointment with Ophthalmology in [facility] but cannot remember provider's name.  Is s/p partial R foot amputation due to spider bite that ended in cellulitis and complex abscess.  Patient is followed by the [facility] weekly. Patient endorses currently taking Levemir 20 units QPM, Humalog 10 units TID with meals and Metformin 500 mg BID. Endorses AM fasting sugars averaging 280s-300s. Denies urinary symptoms. Is not currently on ACEI. Denies hx of hypertension. Patient on Gabapentin 600 mg TID for neuropathy with good relief of symptoms.  Patient also with history of hyperlipidemia, currently on Lipitor 20 mg daily. Denies myalgias.  Patient also with history of Bipolar I disorder, followed by Psychiatry. Is currently on Lithium and Lexapro with good results. Denies rapid cycling.  Denies depressed mood or manic behavior.  Past Medical History  Diagnosis Date  . Diabetes mellitus   . Bipolar 1 disorder   . Hypertension     past hx of  . H/O hiatal hernia   . Numbness and tingling     Hx; of in B/LLE and B/LUE  . Hyperlipidemia     Past Surgical History  Procedure Laterality Date  . Breast surgery    . Abdominal hysterectomy    . Colon surgery    . Nasal septum surgery    . Amputation  10/24/2011    Procedure: AMPUTATION RAY;  Surgeon: Newt Minion, MD;  Location: [place];  Service: Orthopedics;  Laterality: Right;  Right Foot 3rd Ray Amputation  . Tonsillectomy    . Adenoidectomy      Hx: of  . Amputation Right 12/16/2012    Procedure: Right Foot Transmetatarsal Amputation;  Surgeon: Newt Minion, MD;  Location: [place];  Service: Orthopedics;  Laterality: Right;  . Bladder tact   2002    Current Outpatient Prescriptions on File Prior  to Visit  Medication Sig Dispense Refill  . escitalopram (LEXAPRO) 5 MG tablet Take 5 mg by mouth every morning.    . lithium carbonate 300 MG capsule Take 300-600 mg by mouth 3 (three) times daily with meals. Takes 2 in the am, 1 at dinner & 2 at night     No current facility-administered medications on file prior to visit.    Allergies  Allergen Reactions  . Ativan [Lorazepam] Other (See Comments)    Abnormal behavior  . Darvocet [Propoxyphene N-Acetaminophen] Itching  . Demerol [Meperidine] Other (See Comments)    Abnormal behavior  . Latex Itching  . Oxycodone Other (See Comments)    Abnormal behavior    Family History  Problem Relation Age of Onset  . Diabetes Mother   . Mental illness Mother   . Hypertension Father     History   Social History  . Marital Status: Married    Spouse Name: N/A  . Number of Children: N/A  . Years of Education: N/A   Occupational History  . Not on file.   Social History Main Topics  . Smoking status: Never Smoker   . Smokeless tobacco: Never Used  . Alcohol Use: No  . Drug Use: No  . Sexual Activity:    Partners: Male   Other Topics Concern  . Not on file   Social History Narrative   Review of Systems  Constitutional: Negative  for fever and malaise/fatigue.  Eyes: Positive for blurred vision.  Respiratory: Negative for cough and hemoptysis.   Cardiovascular: Negative for chest pain and palpitations.  Genitourinary: Negative for dysuria, urgency, frequency, hematuria and flank pain.  Neurological: Negative for dizziness, loss of consciousness and headaches.  Psychiatric/Behavioral: Negative for depression, suicidal ideas, hallucinations and substance abuse. The patient is not nervous/anxious and does not have insomnia.    BP 115/71 mmHg  Pulse 79  Temp(Src) 98.1 F (36.7 C) (Oral)  Ht '5\' 1"'  (1.549 m)  Wt 167 lb (75.751 kg)  BMI 31.57 kg/m2  SpO2 100%  Physical Exam  Constitutional: She is oriented to person, place,  and time and well-developed, well-nourished, and in no distress.  HENT:  Head: Normocephalic and atraumatic.  Eyes: Conjunctivae are normal.  Cardiovascular: Normal rate, regular rhythm, normal heart sounds and intact distal pulses.   Pulmonary/Chest: Effort normal and breath sounds normal. No respiratory distress. She has no wheezes. She has no rales. She exhibits no tenderness.  Neurological: She is alert and oriented to person, place, and time.  Skin: Skin is warm and dry. No rash noted.  Psychiatric: Affect normal.  Vitals reviewed.   Recent Results (from the past 2160 hour(s))  Comp Met (CMET)     Status: Abnormal   Collection Time: 11/08/14  8:53 AM  Result Value Ref Range   Sodium 133 (L) 135 - 145 mEq/L   Potassium 4.1 3.5 - 5.1 mEq/L   Chloride 95 (L) 96 - 112 mEq/L   CO2 32 19 - 32 mEq/L   Glucose, Bld 394 (H) 70 - 99 mg/dL   BUN 15 6 - 23 mg/dL   Creatinine, Ser 0.81 0.40 - 1.20 mg/dL   Total Bilirubin 0.5 0.2 - 1.2 mg/dL   Alkaline Phosphatase 111 39 - 117 U/L   AST 12 0 - 37 U/L   ALT 11 0 - 35 U/L   Total Protein 8.1 6.0 - 8.3 g/dL   Albumin 3.6 3.5 - 5.2 g/dL   Calcium 9.7 8.4 - 10.5 mg/dL   GFR 77.24 >60.00 mL/min  Hemoglobin A1c     Status: Abnormal   Collection Time: 11/08/14  8:53 AM  Result Value Ref Range   Hgb A1c MFr Bld 10.1 (H) 4.6 - 6.5 %    Comment: Glycemic Control Guidelines for People with Diabetes:Non Diabetic:  <6%Goal of Therapy: <7%Additional Action Suggested:  >8%   Urine Microalbumin w/creat. ratio     Status: Abnormal   Collection Time: 11/08/14  8:53 AM  Result Value Ref Range   Microalb, Ur 4.0 (H) 0.0 - 1.9 mg/dL   Creatinine,U 191.9 mg/dL   Microalb Creat Ratio 2.1 0.0 - 30.0 mg/g  Urinalysis, Routine w reflex microscopic (not at [facility])     Status: Abnormal   Collection Time: 11/08/14  8:53 AM  Result Value Ref Range   Color, Urine YELLOW Yellow;Lt. Yellow   APPearance CLEAR Clear   Specific Gravity, Urine 1.025 1.000-1.030   pH  6.0 5.0 - 8.0   Total Protein, Urine TRACE (A) Negative   Urine Glucose 500 (A) Negative   Ketones, ur NEGATIVE Negative   Bilirubin Urine NEGATIVE Negative   Hgb urine dipstick SMALL (A) Negative   Urobilinogen, UA 2.0 (A) 0.0 - 1.0   Leukocytes, UA NEGATIVE Negative   Nitrite NEGATIVE Negative   WBC, UA 7-10/hpf (A) 0-2/hpf   RBC / HPF 0-2/hpf 0-2/hpf   Mucus, UA Presence of (A) None   Squamous  Epithelial / LPF Rare(0-4/hpf) Rare(0-4/hpf)   Hyaline Casts, UA Presence of (A) None    Assessment/Plan: Bipolar 1 disorder Followed by Psychiatry. Stable on Lithium and Lexapro. Continue same.  DM (diabetes mellitus) type II uncontrolled with eye manifestation Uncontrolled and with retinopathy and neuropathy. Will repeat A1C, BMP, UA. Will obtain Urine Microalbumin to assess for nephropathy. Will increase Levemir to 24 units QPM, increasing by 2 units every 3-4 nights until AM fasting sugar 80-130. Continue Humalog and Metformin as directed. Foot examination with good sensation. Patient s/p partial foot amputation of R foot. Follow-up 3-4 weeks.  Hyperlipidemia LDL goal <100 Tolerating statin without myalgias. Continue lipitor. Will repeat lipids at follow-up.  Diabetic polyneuropathy associated with type 2 diabetes mellitus Obtaining labs to assess glycemic control. Insulin dosing increased to help with sugar levels. Dietary measures discussed. Continue gabapentin as directed.  Partial nontraumatic amputation of foot Healing nicely. Follow-ed by [facility] weekly. Patient encouraged to keep appointments as scheduled.

## 2014-11-08 NOTE — Assessment & Plan Note (Signed)
Uncontrolled and with retinopathy and neuropathy. Will repeat A1C, BMP, UA. Will obtain Urine Microalbumin to assess for nephropathy. Will increase Levemir to 24 units QPM, increasing by 2 units every 3-4 nights until AM fasting sugar 80-130. Continue Humalog and Metformin as directed. Foot examination with good sensation. Patient s/p partial foot amputation of R foot. Follow-up 3-4 weeks.

## 2014-11-08 NOTE — Assessment & Plan Note (Signed)
Tolerating statin without myalgias. Continue lipitor. Will repeat lipids at follow-up.

## 2014-11-08 NOTE — Progress Notes (Signed)
Pre visit review using our clinic review tool, if applicable. No additional management support is needed unless otherwise documented below in the visit note. 

## 2014-11-08 NOTE — Assessment & Plan Note (Signed)
Healing nicely. Follow-ed by [place] weekly. Patient encouraged to keep appointments as scheduled.

## 2014-11-08 NOTE — Assessment & Plan Note (Signed)
Obtaining labs to assess glycemic control. Insulin dosing increased to help with sugar levels. Dietary measures discussed. Continue gabapentin as directed.

## 2014-11-08 NOTE — Assessment & Plan Note (Signed)
Followed by Psychiatry. Stable on Lithium and Lexapro. Continue same.

## 2014-11-08 NOTE — Patient Instructions (Signed)
Please go to the lab for blood work.  I will call you with your results. Continue medications as directed with the following exceptions:  Increase Levemir to 24 units each evening.  Every 3-4 nights, increase your Levemir by 2 units until your fasting sugar level is 80-130 consistently.  Stop at 34 units max and call me before making any further changes.  Make sure to eat a little snack each night to prevent blood sugar from getting to low.

## 2014-11-08 NOTE — Telephone Encounter (Signed)
FYI

## 2014-11-10 MED ORDER — RAMIPRIL 2.5 MG PO CAPS: 2.5000 mg | ORAL_CAPSULE | Freq: Every day | ORAL | Status: DC

## 2014-11-10 NOTE — Progress Notes (Signed)
Called patient at [phone] [facility]) and left message for return call.

## 2014-11-10 NOTE — Addendum Note (Signed)
Addended by: Leticia Penna A on: 11/10/2014 11:09 AM   Modules accepted: Orders

## 2014-11-15 ENCOUNTER — Telehealth: Payer: Self-pay | Admitting: Physician Assistant

## 2014-11-15 NOTE — Telephone Encounter (Signed)
Pt called back about lab results from [phone] asking if certain levels were checked. Please call pt to discuss the labs that were completed. Ph # [phone]

## 2014-11-15 NOTE — Telephone Encounter (Signed)
Spoke with the pt and she wanted to know if a TSH and Renal was checked when she had her last blood work drawn.  Informed the pt that a renal was checked but the TSH was not.  Pt stated okay and said that she will need to go and have the TSH done.//AB/CMA

## 2015-02-06 ENCOUNTER — Ambulatory Visit: Payer: PPO | Admitting: Physician Assistant

## 2015-02-21 ENCOUNTER — Ambulatory Visit (INDEPENDENT_AMBULATORY_CARE_PROVIDER_SITE_OTHER): Payer: PPO | Admitting: Physician Assistant

## 2015-02-21 ENCOUNTER — Encounter: Payer: Self-pay | Admitting: Physician Assistant

## 2015-02-21 VITALS — BP 108/68 | HR 93 | Temp 97.8°F | Ht 61.0 in | Wt 165.5 lb

## 2015-02-21 DIAGNOSIS — Z23 Encounter for immunization: Secondary | ICD-10-CM

## 2015-02-21 DIAGNOSIS — E11319 Type 2 diabetes mellitus with unspecified diabetic retinopathy without macular edema: Secondary | ICD-10-CM | POA: Diagnosis not present

## 2015-02-21 DIAGNOSIS — Z794 Long term (current) use of insulin: Secondary | ICD-10-CM | POA: Diagnosis not present

## 2015-02-21 DIAGNOSIS — E1165 Type 2 diabetes mellitus with hyperglycemia: Secondary | ICD-10-CM

## 2015-02-21 MED ORDER — GABAPENTIN 300 MG PO CAPS: ORAL_CAPSULE | ORAL | Status: DC

## 2015-02-21 MED ORDER — RAMIPRIL 2.5 MG PO CAPS: 2.5000 mg | ORAL_CAPSULE | Freq: Every day | ORAL | Status: DC

## 2015-02-21 MED ORDER — METFORMIN HCL 500 MG PO TABS
ORAL_TABLET | ORAL | Status: DC
Start: 2015-02-21 — End: 2015-09-17

## 2015-02-21 NOTE — Assessment & Plan Note (Signed)
Discussed barriers to compliance with medications -- Financial barriers exist regarding insulin regimen. Samples of Levemir given today to last over the month. Encouraged her to restart at 30 units but titirate up by 2 units every 3 days to goal CBG level. Will increase Metformin to 1000 mg AM and 500 mg PM. Discussed appropriate diet. After discussion of other, cheaper options for her insulin, patient elects to see an Endocrinologist. Referral placed. Evening dose of gabapentin increased to 900 mg. Will check BMP and A1C today. Flu shot given.

## 2015-02-21 NOTE — Progress Notes (Signed)
Patient presents to clinic today for overdue follow-up of uncontrolled DM II with neuropathy and retinopathy. Patient endorses she has been out of lantus for > 1 month. Has been taking her Metformin as directed along with Gabapentin. Endorses fasting sugars averaging upper 200s. Is trying to watch diet. Has noticed worsening in neuropathy after being off of Lantus. Is having financial issues with insulin.  Past Medical History  Diagnosis Date  . Diabetes mellitus   . Bipolar 1 disorder ([place])   . Hypertension     past hx of  . H/O hiatal hernia   . Numbness and tingling     Hx; of in B/LLE and B/LUE  . Hyperlipidemia     Current Outpatient Prescriptions on File Prior to Visit  Medication Sig Dispense Refill  . aspirin 81 MG tablet Take 81 mg by mouth daily.    Marland Kitchen atorvastatin (LIPITOR) 20 MG tablet Take 1 tablet (20 mg total) by mouth at bedtime. 30 tablet 5  . escitalopram (LEXAPRO) 5 MG tablet Take 5 mg by mouth every morning.    . lithium carbonate 300 MG capsule Take 300-600 mg by mouth 3 (three) times daily with meals. Takes 2 in the am, 1 at dinner & 2 at night    . Insulin Detemir (LEVEMIR FLEXTOUCH) 100 UNIT/ML Pen Increase by 2 units every 3-4 days until Am glucose 80-130 (Patient not taking: Reported on 02/21/2015) 15 mL 5   No current facility-administered medications on file prior to visit.    Allergies  Allergen Reactions  . Ativan [Lorazepam] Other (See Comments)    Abnormal behavior  . Darvocet [Propoxyphene N-Acetaminophen] Itching  . Demerol [Meperidine] Other (See Comments)    Abnormal behavior  . Latex Itching  . Oxycodone Other (See Comments)    Abnormal behavior    Family History  Problem Relation Age of Onset  . Diabetes Mother   . Mental illness Mother   . Hypertension Father     Social History   Social History  . Marital Status: Married    Spouse Name: N/A  . Number of Children: N/A  . Years of Education: N/A   Social History Main Topics   . Smoking status: Never Smoker   . Smokeless tobacco: Never Used  . Alcohol Use: No  . Drug Use: No  . Sexual Activity:    Partners: Male   Other Topics Concern  . None   Social History Narrative    Review of Systems - See HPI.  All other ROS are negative.  BP 108/68 mmHg  Pulse 93  Temp(Src) 97.8 F (36.6 C) (Oral)  Ht 5\' 1"  (1.549 m)  Wt 165 lb 8 oz (75.07 kg)  BMI 31.29 kg/m2  SpO2 96%  Physical Exam  Constitutional: She is oriented to person, place, and time and well-developed, well-nourished, and in no distress.  HENT:  Head: Normocephalic and atraumatic.  Eyes: Conjunctivae are normal.  Cardiovascular: Normal rate, regular rhythm, normal heart sounds and intact distal pulses.   Pulmonary/Chest: Effort normal and breath sounds normal. No respiratory distress. She has no wheezes. She has no rales. She exhibits no tenderness.  Neurological: She is alert and oriented to person, place, and time.  Skin: Skin is warm and dry. No rash noted.  Psychiatric: Affect normal.  Vitals reviewed.   No results found for this or any previous visit (from the past 2160 hour(s)).  Assessment/Plan: DM (diabetes mellitus) type II uncontrolled with eye manifestation Discussed barriers to  compliance with medications -- Financial barriers exist regarding insulin regimen. Samples of Levemir given today to last over the month. Encouraged her to restart at 30 units but titirate up by 2 units every 3 days to goal CBG level. Will increase Metformin to 1000 mg AM and 500 mg PM. Discussed appropriate diet. After discussion of other, cheaper options for her insulin, patient elects to see an Endocrinologist. Referral placed. Evening dose of gabapentin increased to 900 mg. Will check BMP and A1C today. Flu shot given.

## 2015-02-21 NOTE — Patient Instructions (Signed)
Please go to the lab for blood work. Start the new doses of Metformin and Gabapentin as directed. Be more consistent with diet, eating a small meal or snack every 4 hours to stabilize sugars.  Resume your Levemir with samples given, starting at 30 units a night. Increase by 2 units every 3 nights until fasting sugars are averaging 100-120. You will be contacted by Endocrinology for further assessment of uncontrolled diabetes.

## 2015-02-21 NOTE — Progress Notes (Signed)
Pre visit review using our clinic review tool, if applicable. No additional management support is needed unless otherwise documented below in the visit note. 

## 2015-02-22 LAB — BASIC METABOLIC PANEL
BUN: 16 mg/dL (ref 6–23)
CALCIUM: 9.2 mg/dL (ref 8.4–10.5)
CO2: 33 meq/L — AB (ref 19–32)
Chloride: 94 mEq/L — ABNORMAL LOW (ref 96–112)
Creatinine, Ser: 0.89 mg/dL (ref 0.40–1.20)
GFR: 69.22 mL/min (ref >60.00)
Glucose, Bld: 463 mg/dL — ABNORMAL HIGH (ref 70–99)
POTASSIUM: 4.8 meq/L (ref 3.5–5.1)
SODIUM: 132 meq/L — AB (ref 135–145)

## 2015-02-22 LAB — HEMOGLOBIN A1C: Hgb A1c MFr Bld: 13 % — ABNORMAL HIGH (ref 4.6–6.5)

## 2015-03-07 ENCOUNTER — Encounter: Payer: Self-pay | Admitting: Physician Assistant

## 2015-03-07 ENCOUNTER — Ambulatory Visit (INDEPENDENT_AMBULATORY_CARE_PROVIDER_SITE_OTHER): Payer: PPO | Admitting: Physician Assistant

## 2015-03-07 ENCOUNTER — Encounter (HOSPITAL_BASED_OUTPATIENT_CLINIC_OR_DEPARTMENT_OTHER): Payer: Self-pay

## 2015-03-07 ENCOUNTER — Inpatient Hospital Stay (HOSPITAL_BASED_OUTPATIENT_CLINIC_OR_DEPARTMENT_OTHER)
Admission: EM | Admit: 2015-03-07 | Discharge: 2015-03-12 | DRG: 504 | Disposition: A | Discharge status: Home Health | Payer: PPO | Attending: Internal Medicine | Admitting: Internal Medicine

## 2015-03-07 VITALS — BP 86/59 | HR 81 | Temp 98.0°F | Resp 16 | Ht 61.0 in | Wt 160.2 lb

## 2015-03-07 DIAGNOSIS — E11622 Type 2 diabetes mellitus with other skin ulcer: Secondary | ICD-10-CM | POA: Diagnosis present

## 2015-03-07 DIAGNOSIS — D649 Anemia, unspecified: Secondary | ICD-10-CM | POA: Diagnosis present

## 2015-03-07 DIAGNOSIS — L02612 Cutaneous abscess of left foot: Secondary | ICD-10-CM | POA: Diagnosis not present

## 2015-03-07 DIAGNOSIS — E11311 Type 2 diabetes mellitus with unspecified diabetic retinopathy with macular edema: Secondary | ICD-10-CM | POA: Diagnosis present

## 2015-03-07 DIAGNOSIS — I10 Essential (primary) hypertension: Secondary | ICD-10-CM | POA: Diagnosis present

## 2015-03-07 DIAGNOSIS — Z89422 Acquired absence of other left toe(s): Secondary | ICD-10-CM | POA: Diagnosis not present

## 2015-03-07 DIAGNOSIS — IMO0002 Reserved for concepts with insufficient information to code with codable children: Secondary | ICD-10-CM | POA: Diagnosis present

## 2015-03-07 DIAGNOSIS — L02619 Cutaneous abscess of unspecified foot: Secondary | ICD-10-CM | POA: Diagnosis present

## 2015-03-07 DIAGNOSIS — E1169 Type 2 diabetes mellitus with other specified complication: Secondary | ICD-10-CM

## 2015-03-07 DIAGNOSIS — M86171 Other acute osteomyelitis, right ankle and foot: Secondary | ICD-10-CM

## 2015-03-07 DIAGNOSIS — F3162 Bipolar disorder, current episode mixed, moderate: Secondary | ICD-10-CM | POA: Diagnosis not present

## 2015-03-07 DIAGNOSIS — E1310 Other specified diabetes mellitus with ketoacidosis without coma: Secondary | ICD-10-CM | POA: Diagnosis not present

## 2015-03-07 DIAGNOSIS — E1165 Type 2 diabetes mellitus with hyperglycemia: Secondary | ICD-10-CM | POA: Diagnosis present

## 2015-03-07 DIAGNOSIS — M869 Osteomyelitis, unspecified: Principal | ICD-10-CM | POA: Diagnosis present

## 2015-03-07 DIAGNOSIS — L03032 Cellulitis of left toe: Secondary | ICD-10-CM | POA: Diagnosis present

## 2015-03-07 DIAGNOSIS — E11621 Type 2 diabetes mellitus with foot ulcer: Secondary | ICD-10-CM | POA: Diagnosis not present

## 2015-03-07 DIAGNOSIS — E785 Hyperlipidemia, unspecified: Secondary | ICD-10-CM | POA: Diagnosis present

## 2015-03-07 DIAGNOSIS — E1139 Type 2 diabetes mellitus with other diabetic ophthalmic complication: Secondary | ICD-10-CM | POA: Diagnosis present

## 2015-03-07 DIAGNOSIS — F319 Bipolar disorder, unspecified: Secondary | ICD-10-CM | POA: Diagnosis present

## 2015-03-07 DIAGNOSIS — E1142 Type 2 diabetes mellitus with diabetic polyneuropathy: Secondary | ICD-10-CM | POA: Diagnosis present

## 2015-03-07 DIAGNOSIS — Z833 Family history of diabetes mellitus: Secondary | ICD-10-CM

## 2015-03-07 DIAGNOSIS — I739 Peripheral vascular disease, unspecified: Secondary | ICD-10-CM | POA: Diagnosis present

## 2015-03-07 DIAGNOSIS — L03039 Cellulitis of unspecified toe: Secondary | ICD-10-CM

## 2015-03-07 DIAGNOSIS — Z7982 Long term (current) use of aspirin: Secondary | ICD-10-CM

## 2015-03-07 DIAGNOSIS — Z794 Long term (current) use of insulin: Secondary | ICD-10-CM

## 2015-03-07 DIAGNOSIS — E11628 Type 2 diabetes mellitus with other skin complications: Secondary | ICD-10-CM

## 2015-03-07 DIAGNOSIS — Z7984 Long term (current) use of oral hypoglycemic drugs: Secondary | ICD-10-CM

## 2015-03-07 DIAGNOSIS — Z8249 Family history of ischemic heart disease and other diseases of the circulatory system: Secondary | ICD-10-CM

## 2015-03-07 DIAGNOSIS — L97529 Non-pressure chronic ulcer of other part of left foot with unspecified severity: Secondary | ICD-10-CM | POA: Diagnosis present

## 2015-03-07 DIAGNOSIS — L97509 Non-pressure chronic ulcer of other part of unspecified foot with unspecified severity: Secondary | ICD-10-CM

## 2015-03-07 DIAGNOSIS — E1152 Type 2 diabetes mellitus with diabetic peripheral angiopathy with gangrene: Secondary | ICD-10-CM | POA: Diagnosis present

## 2015-03-07 DIAGNOSIS — D638 Anemia in other chronic diseases classified elsewhere: Secondary | ICD-10-CM | POA: Diagnosis not present

## 2015-03-07 DIAGNOSIS — M79675 Pain in left toe(s): Secondary | ICD-10-CM | POA: Diagnosis present

## 2015-03-07 DIAGNOSIS — L089 Local infection of the skin and subcutaneous tissue, unspecified: Secondary | ICD-10-CM

## 2015-03-07 HISTORY — DX: Cellulitis of unspecified part of limb: L03.119

## 2015-03-07 HISTORY — DX: Type 2 diabetes mellitus with diabetic neuropathy, unspecified: E11.40

## 2015-03-07 HISTORY — DX: Cutaneous abscess of unspecified foot: L02.619

## 2015-03-07 LAB — CBC WITH DIFFERENTIAL/PLATELET
BASOS PCT: 0 %
Basophils Absolute: 0 10*3/uL (ref 0.0–0.1)
EOS ABS: 0.3 10*3/uL (ref 0.0–0.7)
EOS PCT: 3 %
HCT: 27 % — ABNORMAL LOW (ref 36.0–46.0)
Hemoglobin: 8.6 g/dL — ABNORMAL LOW (ref 12.0–15.0)
LYMPHS ABS: 2.2 10*3/uL (ref 0.7–4.0)
Lymphocytes Relative: 19 %
MCH: 26.1 pg (ref 26.0–34.0)
MCHC: 31.9 g/dL (ref 30.0–36.0)
MCV: 82.1 fL (ref 78.0–100.0)
Monocytes Absolute: 0.8 10*3/uL (ref 0.1–1.0)
Monocytes Relative: 8 %
Neutro Abs: 7.8 10*3/uL — ABNORMAL HIGH (ref 1.7–7.7)
Neutrophils Relative %: 70 %
PLATELETS: 565 10*3/uL — AB (ref 150–400)
RBC: 3.29 MIL/uL — AB (ref 3.87–5.11)
RDW: 14.2 % (ref 11.5–15.5)
WBC: 11.1 10*3/uL — AB (ref 4.0–10.5)

## 2015-03-07 LAB — COMPREHENSIVE METABOLIC PANEL
ALK PHOS: 124 U/L (ref 38–126)
ALT: 17 U/L (ref 14–54)
AST: 16 U/L (ref 15–41)
Albumin: 2.9 g/dL — ABNORMAL LOW (ref 3.5–5.0)
Anion gap: 7 (ref 5–15)
BILIRUBIN TOTAL: 0.4 mg/dL (ref 0.3–1.2)
BUN: 28 mg/dL — AB (ref 6–20)
CALCIUM: 9.1 mg/dL (ref 8.9–10.3)
CHLORIDE: 98 mmol/L — AB (ref 101–111)
CO2: 27 mmol/L (ref 22–32)
CREATININE: 0.95 mg/dL (ref 0.44–1.00)
GFR calc Af Amer: >60 mL/min (ref >60)
Glucose, Bld: 199 mg/dL — ABNORMAL HIGH (ref 65–99)
Potassium: 4.5 mmol/L (ref 3.5–5.1)
Sodium: 132 mmol/L — ABNORMAL LOW (ref 135–145)
TOTAL PROTEIN: 8.3 g/dL — AB (ref 6.5–8.1)

## 2015-03-07 LAB — GLUCOSE, CAPILLARY: GLUCOSE-CAPILLARY: 187 mg/dL — AB (ref 65–99)

## 2015-03-07 LAB — I-STAT CG4 LACTIC ACID, ED: LACTIC ACID, VENOUS: 1.35 mmol/L (ref 0.5–2.0)

## 2015-03-07 MED ORDER — INSULIN ASPART 100 UNIT/ML ~~LOC~~ SOLN: 0.0000 [IU] | Freq: Every day | SUBCUTANEOUS | Status: DC

## 2015-03-07 MED ORDER — VANCOMYCIN HCL IN DEXTROSE 1-5 GM/200ML-% IV SOLN
1000.0000 mg | Freq: Once | INTRAVENOUS | Status: AC
  Administered 2015-03-07: 1000 mg via INTRAVENOUS
  Filled 2015-03-07: qty 200

## 2015-03-07 MED ORDER — ONDANSETRON HCL 4 MG PO TABS: 4.0000 mg | ORAL_TABLET | Freq: Four times a day (QID) | ORAL | Status: DC | PRN

## 2015-03-07 MED ORDER — SODIUM CHLORIDE 0.9 % IJ SOLN
3.0000 mL | Freq: Two times a day (BID) | INTRAMUSCULAR | Status: DC
  Administered 2015-03-07 – 2015-03-10 (×6): 3 mL via INTRAVENOUS

## 2015-03-07 MED ORDER — ZOLPIDEM TARTRATE 5 MG PO TABS: 5.0000 mg | ORAL_TABLET | Freq: Every evening | ORAL | Status: DC | PRN

## 2015-03-07 MED ORDER — SODIUM CHLORIDE 0.9 % IJ SOLN: 3.0000 mL | INTRAMUSCULAR | Status: DC | PRN

## 2015-03-07 MED ORDER — GABAPENTIN 300 MG PO CAPS
600.0000 mg | ORAL_CAPSULE | Freq: Three times a day (TID) | ORAL | Status: DC
  Administered 2015-03-07 – 2015-03-12 (×13): 600 mg via ORAL
  Filled 2015-03-07 (×13): qty 2

## 2015-03-07 MED ORDER — VANCOMYCIN HCL IN DEXTROSE 750-5 MG/150ML-% IV SOLN
750.0000 mg | Freq: Two times a day (BID) | INTRAVENOUS | Status: DC
  Administered 2015-03-08 – 2015-03-12 (×9): 750 mg via INTRAVENOUS
  Filled 2015-03-07 (×10): qty 150

## 2015-03-07 MED ORDER — PIPERACILLIN-TAZOBACTAM 3.375 G IVPB 30 MIN
3.3750 g | Freq: Once | INTRAVENOUS | Status: AC
  Administered 2015-03-07: 3.375 g via INTRAVENOUS
  Filled 2015-03-07 (×2): qty 50

## 2015-03-07 MED ORDER — SODIUM CHLORIDE 0.9 % IV SOLN
250.0000 mL | INTRAVENOUS | Status: DC | PRN
  Administered 2015-03-07: 250 mL via INTRAVENOUS

## 2015-03-07 MED ORDER — ESCITALOPRAM OXALATE 5 MG PO TABS
7.5000 mg | ORAL_TABLET | Freq: Every morning | ORAL | Status: DC
  Administered 2015-03-08 – 2015-03-12 (×5): 7.5 mg via ORAL
  Filled 2015-03-07 (×5): qty 2

## 2015-03-07 MED ORDER — ONDANSETRON HCL 4 MG/2ML IJ SOLN
4.0000 mg | Freq: Four times a day (QID) | INTRAMUSCULAR | Status: DC | PRN
  Administered 2015-03-09: 4 mg via INTRAVENOUS

## 2015-03-07 MED ORDER — INSULIN ASPART 100 UNIT/ML ~~LOC~~ SOLN
0.0000 [IU] | Freq: Three times a day (TID) | SUBCUTANEOUS | Status: DC
  Administered 2015-03-08 (×2): 5 [IU] via SUBCUTANEOUS
  Administered 2015-03-08: 3 [IU] via SUBCUTANEOUS
  Administered 2015-03-09 (×2): 2 [IU] via SUBCUTANEOUS
  Administered 2015-03-10: 5 [IU] via SUBCUTANEOUS
  Administered 2015-03-11 (×2): 3 [IU] via SUBCUTANEOUS
  Administered 2015-03-11: 5 [IU] via SUBCUTANEOUS
  Administered 2015-03-12: 3 [IU] via SUBCUTANEOUS

## 2015-03-07 MED ORDER — ATORVASTATIN CALCIUM 10 MG PO TABS
20.0000 mg | ORAL_TABLET | Freq: Every day | ORAL | Status: DC
  Administered 2015-03-07 – 2015-03-11 (×5): 20 mg via ORAL
  Filled 2015-03-07 (×4): qty 2

## 2015-03-07 MED ORDER — RAMIPRIL 2.5 MG PO CAPS
2.5000 mg | ORAL_CAPSULE | Freq: Every day | ORAL | Status: DC
  Administered 2015-03-08 – 2015-03-12 (×4): 2.5 mg via ORAL
  Filled 2015-03-07 (×5): qty 1

## 2015-03-07 MED ORDER — LITHIUM CARBONATE 300 MG PO CAPS
300.0000 mg | ORAL_CAPSULE | Freq: Every day | ORAL | Status: DC
  Filled 2015-03-07 (×5): qty 1

## 2015-03-07 MED ORDER — LITHIUM CARBONATE 300 MG PO CAPS
600.0000 mg | ORAL_CAPSULE | Freq: Two times a day (BID) | ORAL | Status: DC
  Administered 2015-03-10: 600 mg via ORAL
  Filled 2015-03-07 (×11): qty 2

## 2015-03-07 MED ORDER — PIPERACILLIN-TAZOBACTAM 3.375 G IVPB
3.3750 g | Freq: Three times a day (TID) | INTRAVENOUS | Status: DC
  Administered 2015-03-08 – 2015-03-12 (×13): 3.375 g via INTRAVENOUS
  Filled 2015-03-07 (×15): qty 50

## 2015-03-07 MED ORDER — HEPARIN SODIUM (PORCINE) 5000 UNIT/ML IJ SOLN
5000.0000 [IU] | Freq: Three times a day (TID) | INTRAMUSCULAR | Status: DC
  Administered 2015-03-07 – 2015-03-12 (×12): 5000 [IU] via SUBCUTANEOUS
  Filled 2015-03-07 (×12): qty 1

## 2015-03-07 NOTE — ED Notes (Addendum)
?  toe infection after pedicure 3 weeks ago-was seen at wound care this week-dx with bone infection-rx bactrim-was changed to cipro by [facility]-sent now from PCP in building

## 2015-03-07 NOTE — H&P (Addendum)
[facility]                                                                                    Patient Demographics  Dana [name], is a 59 y.o. female  CSN: [id number]  MRN: [id number]  DOB - 02-03-1956  Admit Date - 03/07/2015  Outpatient Primary MD for the patient is [name], PA-C   With History of -  Past Medical History  Diagnosis Date  . Diabetes mellitus   . Bipolar 1 disorder ([place])   . Hypertension     past hx of  . H/O hiatal hernia   . Numbness and tingling     Hx; of in B/LLE and B/LUE  . Hyperlipidemia       Past Surgical History  Procedure Laterality Date  . Breast surgery    . Abdominal hysterectomy    . Colon surgery    . Nasal septum surgery    . Amputation  10/24/2011    Procedure: AMPUTATION RAY;  Surgeon: Newt Minion, MD;  Location: [place];  Service: Orthopedics;  Laterality: Right;  Right Foot 3rd Ray Amputation  . Tonsillectomy    . Adenoidectomy      Hx: of  . Amputation Right 12/16/2012    Procedure: Right Foot Transmetatarsal Amputation;  Surgeon: Newt Minion, MD;  Location: [place];  Service: Orthopedics;  Laterality: Right;  . Bladder tact   2002    in for   Chief Complaint  Patient presents with  . Toe Pain     HPI  Dana [name]  is a 59 y.o. female, with past medical history significant for diabetes mellitus, presenting with left toe ulcer and pain after she had a pedicure. Symptoms started a few weeks ago no fever or chills and she has been receiving treatment at the local wound care clinic and was started 3 days on by mouth antibiotics. The ulcer has gotten much worse and she was seen in [facility] who discussed the case with orthopedics Dr. Sharol Given group who advised admission for IV antibiotics and surgical intervention. The patient had had a right transmetatarsal amputation of right foot in the past and the wound is clean.    Review of Systems    In addition to the HPI above,  No  Fever-chills, No Headache, No changes with Vision or hearing, No problems swallowing food or Liquids, No Chest pain, Cough or Shortness of Breath, No Abdominal pain, No Nausea or Vommitting, Bowel movements are regular, No Blood in stool or Urine, No dysuria, No new skin rashes or bruises, No new joints pains-aches,  No new weakness, tingling, numbness in any extremity, No recent weight gain or loss, No polyuria, polydypsia or polyphagia, No significant Mental Stressors.  A full 10 point Review of Systems was done, except as stated above, all other Review of Systems were negative.   Social History Social History  Substance Use Topics  . Smoking status: Never Smoker   . Smokeless tobacco: Never Used  . Alcohol Use: No     Family History Family History  Problem Relation Age of Onset  . Diabetes Mother   .  Mental illness Mother   . Hypertension Father     Prior to Admission medications   Medication Sig Start Date End Date Taking? Authorizing Provider  aspirin 81 MG tablet Take 81 mg by mouth daily.    Historical Provider, MD  atorvastatin (LIPITOR) 20 MG tablet Take 1 tablet (20 mg total) by mouth at bedtime. 11/08/14   Brunetta Jeans, PA-C  ciprofloxacin (CIPRO) 500 MG tablet Take 500 mg by mouth 2 (two) times daily.    Historical Provider, MD  escitalopram (LEXAPRO) 5 MG tablet Take 7.5 mg by mouth every morning.     Historical Provider, MD  gabapentin (NEURONTIN) 300 MG capsule Take 600 mg each at breakfast and lunch. Take 900 mg at night. 02/21/15   Brunetta Jeans, PA-C  Insulin Detemir (LEVEMIR FLEXTOUCH) 100 UNIT/ML Pen Increase by 2 units every 3-4 days until Am glucose 80-130 11/08/14   Brunetta Jeans, PA-C  lithium carbonate 300 MG capsule Take 300-600 mg by mouth 3 (three) times daily with meals. Takes 2 in the am, 1 at dinner & 2 at night    Historical Provider, MD  metFORMIN (GLUCOPHAGE) 500 MG tablet Take 1000 mg each morning and 500 mg each evening 02/21/15    Brunetta Jeans, PA-C  ramipril (ALTACE) 2.5 MG capsule Take 1 capsule (2.5 mg total) by mouth daily. 02/21/15   Brunetta Jeans, PA-C    Allergies  Allergen Reactions  . Ativan [Lorazepam] Other (See Comments)    Abnormal behavior  . Darvocet [Propoxyphene N-Acetaminophen] Itching  . Demerol [Meperidine] Other (See Comments)    Abnormal behavior  . Latex Itching  . Oxycodone Other (See Comments)    Abnormal behavior    Physical Exam  Vitals  Blood pressure 120/71, pulse 78, temperature 98 F (36.7 C), temperature source Oral, resp. rate 18, SpO2 97 %.   1. General well-developed, well-nourished female in no acute distress  2. Normal affect and insight, Not Suicidal or Homicidal, Awake Alert, Oriented X 3.  3. No F.N deficits, grossly .  4. Ears and Eyes appear Normal, Conjunctivae clear, PERRLA. Moist Oral Mucosa.  5. Supple Neck, No JVD, No cervical lymphadenopathy appriciated, No Carotid Bruits.  6. Symmetrical Chest wall movement, Good air movement bilaterally, CTAB.  7. RRR, No Gallops, Rubs or Murmurs, No Parasternal Heave.  8. Positive Bowel Sounds, Abdomen Soft, Non tender, No organomegaly appriciated,No rebound -guarding or rigidity.  9.  No Cyanosis, Normal Skin Turgor, No Skin Rash or Bruise.  10. Extremities: Left first toe ulcer and emphysema with cellulitis and erythema and partial auto amputation, right transmetatarsal old amputation of right foot .    Data Review  CBC  Recent Labs Lab 03/07/15 1915  WBC 11.1*  HGB 8.6*  HCT 27.0*  PLT 565*  MCV 82.1  MCH 26.1  MCHC 31.9  RDW 14.2  LYMPHSABS 2.2  MONOABS 0.8  EOSABS 0.3  BASOSABS 0.0   ------------------------------------------------------------------------------------------------------------------  Chemistries   Recent Labs Lab 03/07/15 1915  NA 132*  K 4.5  CL 98*  CO2 27  GLUCOSE 199*  BUN 28*  CREATININE 0.95  CALCIUM 9.1  AST 16  ALT 17  ALKPHOS 124  BILITOT 0.4    ------------------------------------------------------------------------------------------------------------------ estimated creatinine clearance is 58.9 mL/min (by C-G formula based on Cr of 0.95). ------------------------------------------------------------------------------------------------------------------ No results for input(s): TSH, T4TOTAL, T3FREE, THYROIDAB in the last 72 hours.  Invalid input(s): FREET3   Coagulation profile No results for input(s): INR, PROTIME in the  last 168 hours. ------------------------------------------------------------------------------------------------------------------- No results for input(s): DDIMER in the last 72 hours. -------------------------------------------------------------------------------------------------------------------  Cardiac Enzymes No results for input(s): CKMB, TROPONINI, MYOGLOBIN in the last 168 hours.  Invalid input(s): CK ------------------------------------------------------------------------------------------------------------------ Invalid input(s): POCBNP   ---------------------------------------------------------------------------------------------------------------  Urinalysis    Component Value Date/Time   COLORURINE YELLOW 11/08/2014 [place] 11/08/2014 0853   LABSPEC 1.025 11/08/2014 0853   PHURINE 6.0 11/08/2014 0853   GLUCOSEU 500* 11/08/2014 0853   HGBUR SMALL* 11/08/2014 0853   BILIRUBINUR NEGATIVE 11/08/2014 0853   KETONESUR NEGATIVE 11/08/2014 0853   UROBILINOGEN 2.0* 11/08/2014 0853   NITRITE NEGATIVE 11/08/2014 0853   LEUKOCYTESUR NEGATIVE 11/08/2014 0853    ----------------------------------------------------------------------------------------------------------------  AImaging results:   Assessment & Plan  1. Left first toe ulcer cellulitis and empyema and probably osteomyelitis, failed outpatient therapy 2. Diabetes mellitus on insulin 3. Bipolar disorder on  lithium 4. Peripheral vascular disease status post transmetatarsal amputation of right foot  Plan  IV antibiotics Local treatment per orthopedics Consult orthopedic surgery in a.m./Dr. Sharol Given group IV vancomycin and Zosyn X-ray of left foot Check lithium level  DVT Prophylaxis Heparin   AM Labs Ordered, also please review Full Orders  Code Status full  Disposition Plan: Home  Time spent in minutes : 41 minutes  Condition GUARDED   @SIGNATURE @

## 2015-03-07 NOTE — ED Provider Notes (Signed)
CSN: [id number]     Arrival date & time 03/07/15  1828 History   First MD Initiated Contact with Patient 03/07/15 1840     Chief Complaint  Patient presents with  . Toe Pain     (Consider location/radiation/quality/duration/timing/severity/associated sxs/prior Treatment) HPI Patient is sent from primary provider office for worsening diabetic foot wound despite out patient management. The patient reports that she has been on both Bactrim and ciprofloxacin as an outpatient. She has been going to wound care clinic. She reports her toe has continued to get increasingly red and swollen with an enlarging wound at the great toe. She reports due to her diabetes she does not experience much pain with it. She is sent with a diagnosis of osteomyelitis and plan for orthopedic consultation. Patient denies she's had fever or generalized illness. Past Medical History  Diagnosis Date  . Bipolar 1 disorder ([place])   . Hypertension     past hx of  . H/O hiatal hernia   . Numbness and tingling     Hx; of in B/LLE and B/LUE  . Hyperlipidemia   . Diabetes mellitus     INSULIN DEPENDENT  . Diabetic neuropathy ([place])   . Diabetic neuropathy ([place])   . Cellulitis and abscess of foot 03/08/2015   Past Surgical History  Procedure Laterality Date  . Breast surgery    . Abdominal hysterectomy    . Nasal septum surgery    . Amputation  10/24/2011    Procedure: AMPUTATION RAY;  Surgeon: Newt Minion, MD;  Location: [facility];  Service: Orthopedics;  Laterality: Right;  Right Foot 3rd Ray Amputation  . Tonsillectomy    . Adenoidectomy      Hx: of  . Amputation Right 12/16/2012    Procedure: Right Foot Transmetatarsal Amputation;  Surgeon: Newt Minion, MD;  Location: [facility];  Service: Orthopedics;  Laterality: Right;  . Bladder tact   2002  . Colon surgery     Family History  Problem Relation Age of Onset  . Diabetes Mother   . Mental illness Mother   . Hypertension Father    Social History  Substance Use  Topics  . Smoking status: Never Smoker   . Smokeless tobacco: Never Used  . Alcohol Use: No   OB History    No data available     Review of Systems 10 Systems reviewed and are negative for acute change except as noted in the HPI.   Allergies  Ativan; Darvocet; Demerol; Latex; Lithium; and Oxycodone  Home Medications   Prior to Admission medications   Medication Sig Start Date End Date Taking? Authorizing Provider  aspirin 81 MG tablet Take 81 mg by mouth daily.   Yes Historical Provider, MD  atorvastatin (LIPITOR) 20 MG tablet Take 1 tablet (20 mg total) by mouth at bedtime. 11/08/14  Yes Brunetta Jeans, PA-C  ciprofloxacin (CIPRO) 500 MG tablet Take 500 mg by mouth 2 (two) times daily.   Yes Historical Provider, MD  escitalopram (LEXAPRO) 5 MG tablet Take 7.5 mg by mouth every morning.    Yes Historical Provider, MD  gabapentin (NEURONTIN) 300 MG capsule Take 600 mg each at breakfast and lunch. Take 900 mg at night. Patient taking differently: 600-900 mg 3 (three) times daily. Take 2 capsules at breakfast & lunch Take 3 capsules at bedtime 02/21/15  Yes Brunetta Jeans, PA-C  Insulin Detemir (LEVEMIR FLEXTOUCH) 100 UNIT/ML Pen Increase by 2 units every 3-4 days until Am glucose 80-130  Patient taking differently: Inject 30-34 Units into the skin at bedtime.  11/08/14  Yes Brunetta Jeans, PA-C  metFORMIN (GLUCOPHAGE) 500 MG tablet Take 1000 mg each morning and 500 mg each evening Patient taking differently: Take 500-1,000 mg by mouth 2 (two) times daily with a meal. Take 2 tablets in the morning Take 1 tablet in the evening 02/21/15  Yes Brunetta Jeans, PA-C  ramipril (ALTACE) 2.5 MG capsule Take 1 capsule (2.5 mg total) by mouth daily. 02/21/15  Yes Brunetta Jeans, PA-C   BP 115/68 mmHg  Pulse 81  Temp(Src) 98.4 F (36.9 C) (Oral)  Resp 18  Ht 5\' 1"  (1.549 m)  Wt 160 lb 4.4 oz (72.7 kg)  BMI 30.30 kg/m2  SpO2 97% Physical Exam  Constitutional: She is oriented to  person, place, and time. She appears well-developed and well-nourished.  HENT:  Head: Normocephalic and atraumatic.  Eyes: EOM are normal. Pupils are equal, round, and reactive to light.  Neck: Neck supple.  Cardiovascular: Normal rate, regular rhythm, normal heart sounds and intact distal pulses.   Pulmonary/Chest: Effort normal and breath sounds normal.  Abdominal: Soft. Bowel sounds are normal. She exhibits no distension. There is no tenderness.  Musculoskeletal: Normal range of motion. She exhibits edema.  Severe great toe infection with cellulitis and gangrenous changes left foot. See attached image. Clean, large plantar ulcer on the right foot see attached image.  Neurological: She is alert and oriented to person, place, and time. She has normal strength. Coordination normal. GCS eye subscore is 4. GCS verbal subscore is 5. GCS motor subscore is 6.  Skin: Skin is warm, dry and intact.  Psychiatric: She has a normal mood and affect.              ED Course  Procedures (including critical care time) Labs Review Labs Reviewed  COMPREHENSIVE METABOLIC PANEL - Abnormal; Notable for the following:    Sodium 132 (*)    Chloride 98 (*)    Glucose, Bld 199 (*)    BUN 28 (*)    Total Protein 8.3 (*)    Albumin 2.9 (*)    All other components within normal limits  CBC WITH DIFFERENTIAL/PLATELET - Abnormal; Notable for the following:    WBC 11.1 (*)    RBC 3.29 (*)    Hemoglobin 8.6 (*)    HCT 27.0 (*)    Platelets 565 (*)    Neutro Abs 7.8 (*)    All other components within normal limits  BASIC METABOLIC PANEL - Abnormal; Notable for the following:    Sodium 133 (*)    Chloride 99 (*)    Glucose, Bld 196 (*)    Calcium 8.8 (*)    All other components within normal limits  CBC - Abnormal; Notable for the following:    RBC 3.01 (*)    Hemoglobin 8.0 (*)    HCT 24.6 (*)    Platelets 452 (*)    All other components within normal limits  PROTIME-INR - Abnormal; Notable  for the following:    Prothrombin Time 16.3 (*)    All other components within normal limits  GLUCOSE, CAPILLARY - Abnormal; Notable for the following:    Glucose-Capillary 187 (*)    All other components within normal limits  LITHIUM LEVEL - Abnormal; Notable for the following:    Lithium Lvl <0.06 (*)    All other components within normal limits  GLUCOSE, CAPILLARY - Abnormal; Notable for the following:  Glucose-Capillary 158 (*)    All other components within normal limits  GLUCOSE, CAPILLARY - Abnormal; Notable for the following:    Glucose-Capillary 220 (*)    All other components within normal limits  LIPID PANEL - Abnormal; Notable for the following:    HDL 27 (*)    All other components within normal limits  GLUCOSE, CAPILLARY - Abnormal; Notable for the following:    Glucose-Capillary 240 (*)    All other components within normal limits  GLUCOSE, CAPILLARY - Abnormal; Notable for the following:    Glucose-Capillary 215 (*)    All other components within normal limits  GLUCOSE, CAPILLARY - Abnormal; Notable for the following:    Glucose-Capillary 149 (*)    All other components within normal limits  GLUCOSE, CAPILLARY - Abnormal; Notable for the following:    Glucose-Capillary 129 (*)    All other components within normal limits  GLUCOSE, CAPILLARY - Abnormal; Notable for the following:    Glucose-Capillary 100 (*)    All other components within normal limits  GLUCOSE, CAPILLARY - Abnormal; Notable for the following:    Glucose-Capillary 116 (*)    All other components within normal limits  GLUCOSE, CAPILLARY - Abnormal; Notable for the following:    Glucose-Capillary 116 (*)    All other components within normal limits  GLUCOSE, CAPILLARY - Abnormal; Notable for the following:    Glucose-Capillary 249 (*)    All other components within normal limits  BASIC METABOLIC PANEL - Abnormal; Notable for the following:    Glucose, Bld 177 (*)    Calcium 8.6 (*)    All  other components within normal limits  GLUCOSE, CAPILLARY - Abnormal; Notable for the following:    Glucose-Capillary 154 (*)    All other components within normal limits  GLUCOSE, CAPILLARY - Abnormal; Notable for the following:    Glucose-Capillary 185 (*)    All other components within normal limits  GLUCOSE, CAPILLARY - Abnormal; Notable for the following:    Glucose-Capillary 249 (*)    All other components within normal limits  GLUCOSE, CAPILLARY - Abnormal; Notable for the following:    Glucose-Capillary 227 (*)    All other components within normal limits  GLUCOSE, CAPILLARY - Abnormal; Notable for the following:    Glucose-Capillary 240 (*)    All other components within normal limits  GLUCOSE, CAPILLARY - Abnormal; Notable for the following:    Glucose-Capillary 154 (*)    All other components within normal limits  CULTURE, BLOOD (ROUTINE X 2)  CULTURE, BLOOD (ROUTINE X 2)  SURGICAL PCR SCREEN  GLUCOSE, CAPILLARY  GLUCOSE, CAPILLARY  GLUCOSE, CAPILLARY  GLUCOSE, CAPILLARY  I-STAT CG4 LACTIC ACID, ED    Imaging Review No results found. I have personally reviewed and evaluated these images and lab results as part of my medical decision-making.   EKG Interpretation None      MDM   Final diagnoses:  Diabetic foot infection ([place])  Cellulitis and abscess of toe, left   Patient is admitted for treatment of gangrenous\osteomyelitis diabetic foot wound. She is alert and nontoxic at this time.    Charlesetta Shanks, MD 03/12/15 [phone]

## 2015-03-07 NOTE — Progress Notes (Signed)
Pre visit review using our clinic review tool, if applicable. No additional management support is needed unless otherwise documented below in the visit note/SLS  

## 2015-03-07 NOTE — Patient Instructions (Signed)
Please go downstairs to the ER. The charge nurse is waiting for you. You likely need IV antibiotics, but STAT labs at the least and Orthopedic consult to discuss amputation versus medical treatment.

## 2015-03-07 NOTE — Progress Notes (Signed)
ANTIBIOTIC CONSULT NOTE - INITIAL  Pharmacy Consult for Vancocin and Zosyn Indication: cellulitis and probable osteomyelitis  Allergies  Allergen Reactions  . Ativan [Lorazepam] Other (See Comments)    Abnormal behavior  . Darvocet [Propoxyphene N-Acetaminophen] Itching  . Demerol [Meperidine] Other (See Comments)    Abnormal behavior  . Latex Itching  . Oxycodone Other (See Comments)    Abnormal behavior    Patient Measurements: Height: 5\' 1"  (154.9 cm) Weight: 160 lb 4.4 oz (72.7 kg) IBW/kg (Calculated) : 47.8  Vital Signs: Temp: 98 F (36.7 C) (10/26 2157) Temp Source: Oral (10/26 2157) BP: 120/71 mmHg (10/26 2157) Pulse Rate: 78 (10/26 2157)  Labs:  Recent Labs  03/07/15 1915  WBC 11.1*  HGB 8.6*  PLT 565*  CREATININE 0.95   Estimated Creatinine Clearance: 58.9 mL/min (by C-G formula based on Cr of 0.95).   Microbiology: Recent Results (from the past 720 hour(s))  Culture, blood (routine x 2)     Status: None (Preliminary result)   Collection Time: 03/07/15  7:20 PM  Result Value Ref Range Status   Specimen Description BLOOD LEFT ANTECUBITAL  Final   Special Requests BOTTLES DRAWN AEROBIC AND ANAEROBIC 5CC EACH  Final   Culture PENDING  Incomplete   Report Status PENDING  Incomplete  Culture, blood (routine x 2)     Status: None (Preliminary result)   Collection Time: 03/07/15  7:25 PM  Result Value Ref Range Status   Specimen Description BLOOD RIGHT ANTECUBITAL  Final   Special Requests BOTTLES DRAWN AEROBIC AND ANAEROBIC 5ML EACH  Final   Culture PENDING  Incomplete   Report Status PENDING  Incomplete    Medical History: Past Medical History  Diagnosis Date  . Diabetes mellitus   . Bipolar 1 disorder ([place])   . Hypertension     past hx of  . H/O hiatal hernia   . Numbness and tingling     Hx; of in B/LLE and B/LUE  . Hyperlipidemia     Medications:  Prescriptions prior to admission  Medication Sig Dispense Refill Last Dose  . aspirin 81  MG tablet Take 81 mg by mouth daily.   Taking  . atorvastatin (LIPITOR) 20 MG tablet Take 1 tablet (20 mg total) by mouth at bedtime. 30 tablet 5 Taking  . ciprofloxacin (CIPRO) 500 MG tablet Take 500 mg by mouth 2 (two) times daily.   Taking  . escitalopram (LEXAPRO) 5 MG tablet Take 7.5 mg by mouth every morning.    Taking  . gabapentin (NEURONTIN) 300 MG capsule Take 600 mg each at breakfast and lunch. Take 900 mg at night. 210 capsule 5 Taking  . Insulin Detemir (LEVEMIR FLEXTOUCH) 100 UNIT/ML Pen Increase by 2 units every 3-4 days until Am glucose 80-130 15 mL 5 Taking  . lithium carbonate 300 MG capsule Take 300-600 mg by mouth 3 (three) times daily with meals. Takes 2 in the am, 1 at dinner & 2 at night   Taking  . metFORMIN (GLUCOPHAGE) 500 MG tablet Take 1000 mg each morning and 500 mg each evening 90 tablet 5 Taking  . ramipril (ALTACE) 2.5 MG capsule Take 1 capsule (2.5 mg total) by mouth daily. 30 capsule 3 Taking   Scheduled:  . atorvastatin  20 mg Oral QHS  . [START ON 03/08/2015] escitalopram  7.5 mg Oral q morning - 10a  . gabapentin  600 mg Oral TID  . heparin  5,000 Units Subcutaneous 3 times per day  . [  START ON 03/08/2015] insulin aspart  0-15 Units Subcutaneous TID WC  . insulin aspart  0-5 Units Subcutaneous QHS  . [START ON 03/08/2015] lithium carbonate  300 mg Oral Q supper  . [START ON 03/08/2015] lithium carbonate  600 mg Oral BID AC & HS  . [START ON 03/08/2015] ramipril  2.5 mg Oral Daily  . sodium chloride  3 mL Intravenous Q12H    Assessment: 59yo female presents w/ severe great toe infection after a pedicure, had been seen at wound care clinic and has been on PO ABX x3d but continues to worsen, admitted for IV ABX and surgical intervention.  Goal of Therapy:  Vancomycin trough level 15-20 mcg/ml  Plan:  Rec'd vanc 1g and Zosyn 3.375g IV in ED; will continue with vancomycin 750mg  IV Q12H and Zosyn 3.375g IV Q8H and monitor CBC, Cx, levels prn.  Wynona Neat, PharmD, BCPS  03/07/2015,11:37 PM

## 2015-03-08 ENCOUNTER — Inpatient Hospital Stay (HOSPITAL_COMMUNITY): Payer: PPO

## 2015-03-08 ENCOUNTER — Telehealth: Payer: Self-pay | Admitting: Physician Assistant

## 2015-03-08 ENCOUNTER — Other Ambulatory Visit (HOSPITAL_COMMUNITY): Payer: Self-pay | Admitting: Orthopedic Surgery

## 2015-03-08 ENCOUNTER — Encounter (HOSPITAL_COMMUNITY): Payer: Self-pay | Admitting: General Practice

## 2015-03-08 DIAGNOSIS — E11621 Type 2 diabetes mellitus with foot ulcer: Secondary | ICD-10-CM

## 2015-03-08 DIAGNOSIS — L02612 Cutaneous abscess of left foot: Secondary | ICD-10-CM

## 2015-03-08 DIAGNOSIS — L97509 Non-pressure chronic ulcer of other part of unspecified foot with unspecified severity: Secondary | ICD-10-CM

## 2015-03-08 DIAGNOSIS — L03032 Cellulitis of left toe: Secondary | ICD-10-CM

## 2015-03-08 DIAGNOSIS — L02619 Cutaneous abscess of unspecified foot: Secondary | ICD-10-CM

## 2015-03-08 DIAGNOSIS — L03119 Cellulitis of unspecified part of limb: Secondary | ICD-10-CM

## 2015-03-08 HISTORY — DX: Cutaneous abscess of unspecified foot: L02.619

## 2015-03-08 HISTORY — DX: Cellulitis of unspecified part of limb: L03.119

## 2015-03-08 LAB — CBC
HEMATOCRIT: 24.6 % — AB (ref 36.0–46.0)
HEMOGLOBIN: 8 g/dL — AB (ref 12.0–15.0)
MCH: 26.6 pg (ref 26.0–34.0)
MCHC: 32.5 g/dL (ref 30.0–36.0)
MCV: 81.7 fL (ref 78.0–100.0)
Platelets: 452 10*3/uL — ABNORMAL HIGH (ref 150–400)
RBC: 3.01 MIL/uL — AB (ref 3.87–5.11)
RDW: 14.6 % (ref 11.5–15.5)
WBC: 6.8 10*3/uL (ref 4.0–10.5)

## 2015-03-08 LAB — BASIC METABOLIC PANEL
ANION GAP: 5 (ref 5–15)
BUN: 16 mg/dL (ref 6–20)
CO2: 29 mmol/L (ref 22–32)
Calcium: 8.8 mg/dL — ABNORMAL LOW (ref 8.9–10.3)
Chloride: 99 mmol/L — ABNORMAL LOW (ref 101–111)
Creatinine, Ser: 0.77 mg/dL (ref 0.44–1.00)
GFR calc non Af Amer: >60 mL/min (ref >60)
GLUCOSE: 196 mg/dL — AB (ref 65–99)
POTASSIUM: 4.4 mmol/L (ref 3.5–5.1)
Sodium: 133 mmol/L — ABNORMAL LOW (ref 135–145)

## 2015-03-08 LAB — GLUCOSE, CAPILLARY
GLUCOSE-CAPILLARY: 158 mg/dL — AB (ref 65–99)
GLUCOSE-CAPILLARY: 220 mg/dL — AB (ref 65–99)
GLUCOSE-CAPILLARY: 240 mg/dL — AB (ref 65–99)
Glucose-Capillary: 215 mg/dL — ABNORMAL HIGH (ref 65–99)

## 2015-03-08 LAB — PROTIME-INR
INR: 1.29 (ref 0.00–1.49)
PROTHROMBIN TIME: 16.3 s — AB (ref 11.6–15.2)

## 2015-03-08 LAB — LITHIUM LEVEL: Lithium Lvl: <0.06 mmol/L — ABNORMAL LOW (ref 0.60–1.20)

## 2015-03-08 MED ORDER — INSULIN DETEMIR 100 UNIT/ML ~~LOC~~ SOLN
30.0000 [IU] | Freq: Every day | SUBCUTANEOUS | Status: DC
  Administered 2015-03-08 – 2015-03-11 (×3): 30 [IU] via SUBCUTANEOUS
  Filled 2015-03-08 (×5): qty 0.3

## 2015-03-08 NOTE — Progress Notes (Signed)
[facility] PROGRESS NOTE  Dana [name] [id number] DOB: 10-21-1955 DOA: 03/07/2015 PCP: Dana Rio, PA-C   HPI/Subjective: [name] is a 59 y/o female with PMH Type II DM with peripheral neuropathy/PVD, HLD and past transmetatarsal amputation of the right foot. She presented to the ED 10/27 with complaints of left toe ulcer and pain. Symptoms began three weeks ago after she got a pedicure, wound care had been following her and started her on PO antibiotics. The ulcer continued to worsen and wound clinic recommended she come to the ED for evaluation. Xray of the left great toe showed osteomyelitis, orthopedic surgery consulted with plan for left great toe amputation 10/28.   Assessment/Plan: 1. Abscess with osteomyelitis of L Great Toe  - Patient presented to the ED 10/26 with left toe pain and ulcer after a pedicure x 3 weeks ago - Seen by wound care outpatient, started on PO abx  - Orthopedic surgery consulted this AM with plan for left great toe amputation tomorrow 10/28 - Continue Vanc and Zosyn  2. Possible Osteomyelitis of R transmetatarsal amputation   - At time of presentation patient was also found to have a large ulcer on plantar aspect of the foot with palpable bone  - Ortho surgery consulted with xray showing soft tissue ulceration without findings of osteomyelitis  - Likely will need BKA on the right in the future, ortho to discuss further with the patient  - Continue Vanc and Zosyn   3. DM Type II   - Long-term history of uncontrolled Type II DM with peripheral neuropathy and PVD  - Hgb A1C 13.0 on 02/21/15 ( average CBG of 326) - Blood sugars stable 150 -190s since admission  - Continue Novolog sliding scale with meals and add Levemir tonight.  4. Hyperlipidemia  - Continue Lipitor, check FLP   5. Bipolar Disorder - Lithium level <0.06 - Continue Lithium    Code Status: Full Code Family Communication: No family present at bedside at this time   Disposition Plan: TBD, L great toe amputation scheduled 10/28   Consultants:  Orthopedic Surgery  Procedures:  None   Antibiotics:  Vancomycin   Zosyn    Objective: Filed Vitals:   03/08/15 0551  BP: 109/66  Pulse: 88  Temp: 98.8 F (37.1 C)  Resp: 18   No intake or output data in the 24 hours ending 03/08/15 0919 Filed Weights   03/07/15 2300  Weight: 72.7 kg (160 lb 4.4 oz)    Exam:   General:  Pleasant female, sitting upright in bed, no acute distress  Cardiovascular: Regular rate and rhythm, 3/6 systolic murmur   Respiratory: Breath sounds clear and equal bilaterally. No rales, rhonchi or wheezes.  Abdomen: Bowel sounds present in all 4 quadrants. Abdomen is soft, non-tender.   Musculoskeletal: Left great toe ulcerated with significant cellulitis and purulent drainage, bone is visible at base of wound. Strong pedal pulse in the left foot. Right foot with past transmetatarsal amputation, ulceration present on lateral plantar surface, no drainage.    Data Reviewed: Basic Metabolic Panel:  Recent Labs Lab 03/07/15 1915 03/08/15 0505  NA 132* 133*  K 4.5 4.4  CL 98* 99*  CO2 27 29  GLUCOSE 199* 196*  BUN 28* 16  CREATININE 0.95 0.77  CALCIUM 9.1 8.8*   Liver Function Tests:  Recent Labs Lab 03/07/15 1915  AST 16  ALT 17  ALKPHOS 124  BILITOT 0.4  PROT 8.3*  ALBUMIN 2.9*   No results  for input(s): LIPASE, AMYLASE in the last 168 hours. No results for input(s): AMMONIA in the last 168 hours. CBC:  Recent Labs Lab 03/07/15 1915 03/08/15 0505  WBC 11.1* 6.8  NEUTROABS 7.8*  --   HGB 8.6* 8.0*  HCT 27.0* 24.6*  MCV 82.1 81.7  PLT 565* 452*   CBG:  Recent Labs Lab 03/07/15 2307 03/08/15 0721  GLUCAP 187* 158*    Recent Results (from the past 240 hour(s))  Culture, blood (routine x 2)     Status: None (Preliminary result)   Collection Time: 03/07/15  7:20 PM  Result Value Ref Range Status   Specimen Description BLOOD  LEFT ANTECUBITAL  Final   Special Requests BOTTLES DRAWN AEROBIC AND ANAEROBIC 5CC EACH  Final   Culture PENDING  Incomplete   Report Status PENDING  Incomplete  Culture, blood (routine x 2)     Status: None (Preliminary result)   Collection Time: 03/07/15  7:25 PM  Result Value Ref Range Status   Specimen Description BLOOD RIGHT ANTECUBITAL  Final   Special Requests BOTTLES DRAWN AEROBIC AND ANAEROBIC 5ML EACH  Final   Culture PENDING  Incomplete   Report Status PENDING  Incomplete     Studies: Dg Foot 2 Views Left  03/08/2015  CLINICAL DATA:  Left great toe infection EXAM: LEFT FOOT - 2 VIEW COMPARISON:  MRI of the left forefoot dated March 05, 2015 FINDINGS: There is loss of the soft tissues over the distal third of the distal phalanx of the great toe. There is exposure of the tuft of the distal phalanx. There is loss of the sharp cortical margin of the lateral 1/2 of the anterior surface of the tuft of the distal phalanx. The IP joint is normal. The proximal phalanx is unremarkable. The second through fifth phalanges exhibit no acute abnormalities. The MTP joints exhibit no significant degenerative change. There are changes consistent with Charcot joints of the intertarsal and tarsometatarsal joints. There are large plantar calcaneal spurs. There is soft tissue swelling over the dorsum of the midfoot. IMPRESSION: 1. Subtle changes of osteomyelitis associated with the anterior surface of the tuft of the great toe. There is loss of the soft tissues over the distal aspect of the toe which may be postsurgical or reflect known cellulitis. 2. Charcot changes of the tarsometatarsal and intertarsal joints. Electronically Signed   By: Dana  [name] M.D.   On: 03/08/2015 08:14   Dg Foot Complete Right  03/08/2015  CLINICAL DATA:  1st toe infection, right foot ulcer on bottom of foot EXAM: RIGHT FOOT COMPLETE - 3+ VIEW COMPARISON:  MRI foot dated 08/25/2014 FINDINGS: Status post forefoot amputation.  Soft tissue ulceration along the plantar aspect of the midfoot. Associated soft tissue swelling. No definite cortical irregularity along the overlying bone to suggest osteomyelitis. However, this is not considered a sensitive evaluation. IMPRESSION: Status post forefoot amputation. Soft tissue ulceration along the plantar aspect of the midfoot with associated soft tissue swelling. No definite radiographic findings of osteomyelitis, noting that this is not a sensitive evaluation. Electronically Signed   By: Julian Hy M.D.   On: 03/08/2015 08:16    Scheduled Meds: . atorvastatin  20 mg Oral QHS  . escitalopram  7.5 mg Oral q morning - 10a  . gabapentin  600 mg Oral TID  . heparin  5,000 Units Subcutaneous 3 times per day  . insulin aspart  0-15 Units Subcutaneous TID WC  . insulin aspart  0-5 Units Subcutaneous QHS  .  lithium carbonate  300 mg Oral Q supper  . lithium carbonate  600 mg Oral BID AC & HS  . piperacillin-tazobactam (ZOSYN)  IV  3.375 g Intravenous Q8H  . ramipril  2.5 mg Oral Daily  . sodium chloride  3 mL Intravenous Q12H  . vancomycin  750 mg Intravenous Q12H   Continuous Infusions:   Active Problems:   Cellulitis and abscess of toe   Diabetic ulcer of toe ([place])    Time spent: 35 minutes     Vornheder, Katie PA-S  [facility] Pager [phone] If 7PM-7AM, please contact night-coverage at [email or web address], password [facility] 03/08/2015, 9:19 AM  LOS: 1 day      Addendum  Patient seen and examined, chart and data base reviewed.  I agree with the above assessment and plan.  For full details please see Mrs. Elita Boone PA-S note.  I reviewed and amended the above note as appropriate.   Birdie Hopes, MD [facility] Pager: [phone] 03/08/2015, 1:35 PM

## 2015-03-08 NOTE — Consult Note (Signed)
Reason for Consult: Abscess osteomyelitis left great toe and abscess osteomyelitis right transmetatarsal amputation Referring Physician: Dr. Nitka  Dana [name] is an 59 y.o. female.  HPI: Patient is a 59 year old woman who reports a history of recent pedicure on the left and states that the necrotic changes happened after the pedicure. Patient also states that she has been going to the wound center and [facility] has had wound VAC treatment for the right transmetatarsal amputation has a large ulcer which extends down to bone.  Past Medical History  Diagnosis Date  . Diabetes mellitus   . Bipolar 1 disorder ([place])   . Hypertension     past hx of  . H/O hiatal hernia   . Numbness and tingling     Hx; of in B/LLE and B/LUE  . Hyperlipidemia     Past Surgical History  Procedure Laterality Date  . Breast surgery    . Abdominal hysterectomy    . Colon surgery    . Nasal septum surgery    . Amputation  10/24/2011    Procedure: AMPUTATION RAY;  Surgeon: Newt Minion, MD;  Location: [place];  Service: Orthopedics;  Laterality: Right;  Right Foot 3rd Ray Amputation  . Tonsillectomy    . Adenoidectomy      Hx: of  . Amputation Right 12/16/2012    Procedure: Right Foot Transmetatarsal Amputation;  Surgeon: Newt Minion, MD;  Location: [place];  Service: Orthopedics;  Laterality: Right;  . Bladder tact   2002    Family History  Problem Relation Age of Onset  . Diabetes Mother   . Mental illness Mother   . Hypertension Father     Social History:  reports that she has never smoked. She has never used smokeless tobacco. She reports that she does not drink alcohol or use illicit drugs.  Allergies:  Allergies  Allergen Reactions  . Ativan [Lorazepam] Other (See Comments)    Abnormal behavior  . Darvocet [Propoxyphene N-Acetaminophen] Itching  . Demerol [Meperidine] Other (See Comments)    Abnormal behavior  . Latex Itching  . Oxycodone Other (See Comments)    Abnormal behavior     Medications: I have reviewed the patient's current medications.  Results for orders placed or performed during the hospital encounter of 03/07/15 (from the past 48 hour(s))  Comprehensive metabolic panel     Status: Abnormal   Collection Time: 03/07/15  7:15 PM  Result Value Ref Range   Sodium 132 (L) 135 - 145 mmol/L   Potassium 4.5 3.5 - 5.1 mmol/L   Chloride 98 (L) 101 - 111 mmol/L   CO2 27 22 - 32 mmol/L   Glucose, Bld 199 (H) 65 - 99 mg/dL   BUN 28 (H) 6 - 20 mg/dL   Creatinine, Ser 0.95 0.44 - 1.00 mg/dL   Calcium 9.1 8.9 - 10.3 mg/dL   Total Protein 8.3 (H) 6.5 - 8.1 g/dL   Albumin 2.9 (L) 3.5 - 5.0 g/dL   AST 16 15 - 41 U/L   ALT 17 14 - 54 U/L   Alkaline Phosphatase 124 38 - 126 U/L   Total Bilirubin 0.4 0.3 - 1.2 mg/dL   GFR calc non Af Amer >60 >60 mL/min   GFR calc Af Amer >60 >60 mL/min    Comment: (NOTE) The eGFR has been calculated using the CKD EPI equation. This calculation has not been validated in all clinical situations. eGFR's persistently <60 mL/min signify possible Chronic Kidney Disease.  Anion gap 7 5 - 15  CBC with Differential     Status: Abnormal   Collection Time: 03/07/15  7:15 PM  Result Value Ref Range   WBC 11.1 (H) 4.0 - 10.5 K/uL   RBC 3.29 (L) 3.87 - 5.11 MIL/uL   Hemoglobin 8.6 (L) 12.0 - 15.0 g/dL   HCT 27.0 (L) 36.0 - 46.0 %   MCV 82.1 78.0 - 100.0 fL   MCH 26.1 26.0 - 34.0 pg   MCHC 31.9 30.0 - 36.0 g/dL   RDW 14.2 11.5 - 15.5 %   Platelets 565 (H) 150 - 400 K/uL   Neutrophils Relative % 70 %   Neutro Abs 7.8 (H) 1.7 - 7.7 K/uL   Lymphocytes Relative 19 %   Lymphs Abs 2.2 0.7 - 4.0 K/uL   Monocytes Relative 8 %   Monocytes Absolute 0.8 0.1 - 1.0 K/uL   Eosinophils Relative 3 %   Eosinophils Absolute 0.3 0.0 - 0.7 K/uL   Basophils Relative 0 %   Basophils Absolute 0.0 0.0 - 0.1 K/uL  Culture, blood (routine x 2)     Status: None (Preliminary result)   Collection Time: 03/07/15  7:20 PM  Result Value Ref Range    Specimen Description BLOOD LEFT ANTECUBITAL    Special Requests BOTTLES DRAWN AEROBIC AND ANAEROBIC 5CC EACH    Culture PENDING    Report Status PENDING   Culture, blood (routine x 2)     Status: None (Preliminary result)   Collection Time: 03/07/15  7:25 PM  Result Value Ref Range   Specimen Description BLOOD RIGHT ANTECUBITAL    Special Requests BOTTLES DRAWN AEROBIC AND ANAEROBIC 5ML EACH    Culture PENDING    Report Status PENDING   I-Stat CG4 Lactic Acid, ED     Status: None   Collection Time: 03/07/15  7:26 PM  Result Value Ref Range   Lactic Acid, Venous 1.35 0.5 - 2.0 mmol/L  Glucose, capillary     Status: Abnormal   Collection Time: 03/07/15 11:07 PM  Result Value Ref Range   Glucose-Capillary 187 (H) 65 - 99 mg/dL  Basic metabolic panel     Status: Abnormal   Collection Time: 03/08/15  5:05 AM  Result Value Ref Range   Sodium 133 (L) 135 - 145 mmol/L   Potassium 4.4 3.5 - 5.1 mmol/L   Chloride 99 (L) 101 - 111 mmol/L   CO2 29 22 - 32 mmol/L   Glucose, Bld 196 (H) 65 - 99 mg/dL   BUN 16 6 - 20 mg/dL   Creatinine, Ser 0.77 0.44 - 1.00 mg/dL   Calcium 8.8 (L) 8.9 - 10.3 mg/dL   GFR calc non Af Amer >60 >60 mL/min   GFR calc Af Amer >60 >60 mL/min    Comment: (NOTE) The eGFR has been calculated using the CKD EPI equation. This calculation has not been validated in all clinical situations. eGFR's persistently <60 mL/min signify possible Chronic Kidney Disease.    Anion gap 5 5 - 15  Protime-INR     Status: Abnormal   Collection Time: 03/08/15  5:05 AM  Result Value Ref Range   Prothrombin Time 16.3 (H) 11.6 - 15.2 seconds   INR 1.29 0.00 - 1.49  Lithium level     Status: Abnormal   Collection Time: 03/08/15  5:05 AM  Result Value Ref Range   Lithium Lvl <0.06 (L) 0.60 - 1.20 mmol/L    No results found.  Review of Systems  All other systems reviewed and are negative.  Blood pressure 109/66, pulse 88, temperature 98.8 F (37.1 C), temperature source Oral,  resp. rate 18, height 5' 1" (1.549 m), weight 72.7 kg (160 lb 4.4 oz), SpO2 95 %. Physical Exam On examination patient has a good dorsalis pedis pulse. Examination the left foot she has a large necrotic ulcer of the great toe with cellulitis up to the MTP joint. The distal tuft of the toe has fallen off. The IP joint is exposed with necrotic tissue proximal to the IP joint. Examination the right foot she has a large ulcer over the plantar aspect of the transmetatarsal amputation. This ulcer extends down to bone the bone is palpable. Assessment/Plan: Assessment: Abscess osteomyelitis partial amputation of the left great toe due to necrosis with osteomyelitis of the right transmetatarsal amputation.  Plan: We will plan for a first ray amputation the left tomorrow afternoon Friday. I will obtain x-rays of the right foot. Anticipate patient will need a below the knee amputation on the right I have not discussed this with her. This may need to be a staged procedure to allow her to weight-bear on the right foot while the left foot is healing. Once the left foot is healed then she could proceed with a transtibial amputation on the right. I will discuss this with her once I have radiographs.  Takara Sermons V 03/08/2015, 6:42 AM

## 2015-03-08 NOTE — Progress Notes (Signed)
Inpatient Diabetes Program Recommendations  AACE/ADA: New Consensus Statement on Inpatient Glycemic Control (2015)  Target Ranges:  Prepandial:   less than 140 mg/dL      Peak postprandial:   less than 180 mg/dL (1-2 hours)      Critically ill patients:  140 - 180 mg/dL   Review of Glycemic Control  Results for [place], Bricia B (MRN [id number]) as of 03/08/2015 11:05  Ref. Range 03/07/2015 23:07 03/08/2015 07:21  Glucose-Capillary Latest Ref Range: 65-99 mg/dL 187 (H) 158 (H)    Diabetes history: Type 2 Outpatient Diabetes medications: Levemir 30-34 units qday, Glucophage 1000mg  qam, 500mg  qpm Current orders for Inpatient glycemic control: Novolog correction 0-15 units tid, Novolog correction 0-5 units qhs  Inpatient Diabetes Program Recommendations: Blood sugars stable at this point but she takes Levemir 30-34 units per day at home. Consider starting Levemir 15 units qhs beginning tonight.  Gentry Fitz, RN, BA, MHA, CDE Diabetes Coordinator Inpatient Diabetes Program  [phone] (Team Pager) [phone] ([place]) 03/08/2015 11:20 AM

## 2015-03-08 NOTE — Consult Note (Signed)
   [facility] CM Inpatient Consult   03/08/2015  Dana [name] 09/22/1955 [id number]   Received referral from inpatient DM Coordinator for [name]. Spoke with patient at bedside to explain and offer [place] [facility] services. Patient pleasantly declined. Brochure with contact information left at bedside to call in future should she change her mind in the future. Made inpatient RNCM aware.  Marthenia Rolling, MSN-Ed, RN,BSN [facility] Liaison [phone]

## 2015-03-08 NOTE — Progress Notes (Signed)
Utilization review completed. Ayrton Mcvay, RN, BSN. 

## 2015-03-08 NOTE — Telephone Encounter (Signed)
Left message for pt to call back  °

## 2015-03-08 NOTE — Telephone Encounter (Signed)
Relation to [id number]  Call back number:[phone]   Reason for call:  Spouse states paperwork from [facility] was left in the room and would like paperwork mailed to home

## 2015-03-09 ENCOUNTER — Inpatient Hospital Stay (HOSPITAL_COMMUNITY): Payer: PPO | Admitting: Anesthesiology

## 2015-03-09 ENCOUNTER — Ambulatory Visit: Payer: PPO | Admitting: Endocrinology

## 2015-03-09 ENCOUNTER — Encounter (HOSPITAL_COMMUNITY): Payer: Self-pay | Admitting: Certified Registered Nurse Anesthetist

## 2015-03-09 ENCOUNTER — Encounter (HOSPITAL_COMMUNITY)
Admission: EM | Disposition: A | Discharge status: Home Health | Payer: Self-pay | Source: Home / Self Care | Attending: Internal Medicine

## 2015-03-09 HISTORY — PX: AMPUTATION: SHX166

## 2015-03-09 LAB — LIPID PANEL
CHOL/HDL RATIO: 3.8 ratio
Cholesterol: 103 mg/dL (ref 0–200)
HDL: 27 mg/dL — AB (ref >40)
LDL Cholesterol: 57 mg/dL (ref 0–99)
Triglycerides: 96 mg/dL (ref <150)
VLDL: 19 mg/dL (ref 0–40)

## 2015-03-09 LAB — GLUCOSE, CAPILLARY
GLUCOSE-CAPILLARY: 100 mg/dL — AB (ref 65–99)
Glucose-Capillary: 149 mg/dL — ABNORMAL HIGH (ref 65–99)
Glucose-Capillary: 129 mg/dL — ABNORMAL HIGH (ref 65–99)
Glucose-Capillary: 74 mg/dL (ref 65–99)
Glucose-Capillary: 91 mg/dL (ref 65–99)
Glucose-Capillary: 80 mg/dL (ref 65–99)
Glucose-Capillary: 116 mg/dL — ABNORMAL HIGH (ref 65–99)

## 2015-03-09 LAB — SURGICAL PCR SCREEN
MRSA, PCR: NEGATIVE
STAPHYLOCOCCUS AUREUS: NEGATIVE

## 2015-03-09 SURGERY — AMPUTATION, FOOT, RAY
Anesthesia: General | Site: Foot | Laterality: Left

## 2015-03-09 MED ORDER — DEXTROSE 50 % IV SOLN
INTRAVENOUS | Status: AC
  Administered 2015-03-09: 12.5 mL via INTRAVENOUS
  Filled 2015-03-09: qty 50

## 2015-03-09 MED ORDER — ONDANSETRON HCL 4 MG PO TABS: 4.0000 mg | ORAL_TABLET | Freq: Four times a day (QID) | ORAL | Status: DC | PRN

## 2015-03-09 MED ORDER — HYDROCODONE-ACETAMINOPHEN 5-325 MG PO TABS
ORAL_TABLET | ORAL | Status: AC
  Filled 2015-03-09: qty 2

## 2015-03-09 MED ORDER — OXYCODONE HCL 5 MG PO TABS
ORAL_TABLET | ORAL | Status: AC
  Administered 2015-03-09: 5 mg via ORAL
  Filled 2015-03-09: qty 1

## 2015-03-09 MED ORDER — ONDANSETRON HCL 4 MG/2ML IJ SOLN: 4.0000 mg | Freq: Four times a day (QID) | INTRAMUSCULAR | Status: DC | PRN

## 2015-03-09 MED ORDER — MEPERIDINE HCL 25 MG/ML IJ SOLN: 6.2500 mg | INTRAMUSCULAR | Status: DC | PRN

## 2015-03-09 MED ORDER — DEXTROSE 50 % IV SOLN
12.5000 mL | INTRAVENOUS | Status: AC
  Administered 2015-03-09: 12.5 mL via INTRAVENOUS
  Filled 2015-03-09: qty 50

## 2015-03-09 MED ORDER — PHENYLEPHRINE HCL 10 MG/ML IJ SOLN
INTRAMUSCULAR | Status: DC | PRN
  Administered 2015-03-09: 40 ug via INTRAVENOUS
  Administered 2015-03-09: 80 ug via INTRAVENOUS

## 2015-03-09 MED ORDER — LIDOCAINE HCL (CARDIAC) 20 MG/ML IV SOLN
INTRAVENOUS | Status: DC | PRN
  Administered 2015-03-09: 50 mg via INTRAVENOUS

## 2015-03-09 MED ORDER — ACETAMINOPHEN 650 MG RE SUPP: 650.0000 mg | Freq: Four times a day (QID) | RECTAL | Status: DC | PRN

## 2015-03-09 MED ORDER — ACETAMINOPHEN 325 MG PO TABS: 650.0000 mg | ORAL_TABLET | Freq: Four times a day (QID) | ORAL | Status: DC | PRN

## 2015-03-09 MED ORDER — METHOCARBAMOL 500 MG PO TABS
ORAL_TABLET | ORAL | Status: AC
  Filled 2015-03-09: qty 1

## 2015-03-09 MED ORDER — METHOCARBAMOL 1000 MG/10ML IJ SOLN
500.0000 mg | Freq: Four times a day (QID) | INTRAVENOUS | Status: DC | PRN
  Filled 2015-03-09: qty 5

## 2015-03-09 MED ORDER — HYDROMORPHONE HCL 1 MG/ML IJ SOLN
0.2500 mg | INTRAMUSCULAR | Status: DC | PRN
  Administered 2015-03-09 (×2): 0.5 mg via INTRAVENOUS

## 2015-03-09 MED ORDER — HYDROMORPHONE HCL 1 MG/ML IJ SOLN
1.0000 mg | INTRAMUSCULAR | Status: DC | PRN
  Administered 2015-03-09: 1 mg via INTRAVENOUS
  Filled 2015-03-09: qty 1

## 2015-03-09 MED ORDER — FENTANYL CITRATE (PF) 250 MCG/5ML IJ SOLN
INTRAMUSCULAR | Status: AC
  Filled 2015-03-09: qty 5

## 2015-03-09 MED ORDER — LACTATED RINGERS IV SOLN
INTRAVENOUS | Status: DC | PRN
  Administered 2015-03-09: 15:00:00 via INTRAVENOUS

## 2015-03-09 MED ORDER — PHENYLEPHRINE HCL 10 MG/ML IJ SOLN
10.0000 mg | INTRAVENOUS | Status: DC | PRN
  Administered 2015-03-09: 10 ug/min via INTRAVENOUS

## 2015-03-09 MED ORDER — OXYCODONE HCL 5 MG PO TABS
5.0000 mg | ORAL_TABLET | Freq: Once | ORAL | Status: AC | PRN
  Administered 2015-03-09: 5 mg via ORAL

## 2015-03-09 MED ORDER — HYDROMORPHONE HCL 1 MG/ML IJ SOLN
INTRAMUSCULAR | Status: AC
  Administered 2015-03-09: 0.5 mg via INTRAVENOUS
  Filled 2015-03-09: qty 1

## 2015-03-09 MED ORDER — LACTATED RINGERS IV SOLN
INTRAVENOUS | Status: DC
  Administered 2015-03-09: 14:00:00 via INTRAVENOUS

## 2015-03-09 MED ORDER — FENTANYL CITRATE (PF) 100 MCG/2ML IJ SOLN
INTRAMUSCULAR | Status: DC | PRN
  Administered 2015-03-09: 50 ug via INTRAVENOUS

## 2015-03-09 MED ORDER — METOCLOPRAMIDE HCL 5 MG PO TABS: 5.0000 mg | ORAL_TABLET | Freq: Three times a day (TID) | ORAL | Status: DC | PRN

## 2015-03-09 MED ORDER — HYDROCODONE-ACETAMINOPHEN 5-325 MG PO TABS
1.0000 | ORAL_TABLET | ORAL | Status: DC | PRN
  Administered 2015-03-09 – 2015-03-10 (×2): 2 via ORAL
  Filled 2015-03-09 (×3): qty 2

## 2015-03-09 MED ORDER — PROPOFOL 10 MG/ML IV BOLUS
INTRAVENOUS | Status: DC | PRN
  Administered 2015-03-09: 140 mg via INTRAVENOUS

## 2015-03-09 MED ORDER — OXYCODONE HCL 5 MG/5ML PO SOLN: 5.0000 mg | Freq: Once | ORAL | Status: AC | PRN

## 2015-03-09 MED ORDER — METHOCARBAMOL 500 MG PO TABS
500.0000 mg | ORAL_TABLET | Freq: Four times a day (QID) | ORAL | Status: DC | PRN
  Administered 2015-03-09: 500 mg via ORAL
  Filled 2015-03-09: qty 1

## 2015-03-09 MED ORDER — SODIUM CHLORIDE 0.9 % IV SOLN: INTRAVENOUS | Status: DC

## 2015-03-09 MED ORDER — MIDAZOLAM HCL 2 MG/2ML IJ SOLN
INTRAMUSCULAR | Status: AC
  Filled 2015-03-09: qty 4

## 2015-03-09 MED ORDER — METOCLOPRAMIDE HCL 5 MG/ML IJ SOLN: 5.0000 mg | Freq: Three times a day (TID) | INTRAMUSCULAR | Status: DC | PRN

## 2015-03-09 MED ORDER — PROPOFOL 10 MG/ML IV BOLUS
INTRAVENOUS | Status: AC
  Filled 2015-03-09: qty 20

## 2015-03-09 MED ORDER — DIPHENHYDRAMINE HCL 25 MG PO CAPS
25.0000 mg | ORAL_CAPSULE | Freq: Three times a day (TID) | ORAL | Status: DC | PRN
  Administered 2015-03-09: 25 mg via ORAL
  Filled 2015-03-09: qty 1

## 2015-03-09 MED ORDER — 0.9 % SODIUM CHLORIDE (POUR BTL) OPTIME
TOPICAL | Status: DC | PRN
  Administered 2015-03-09: 1000 mL

## 2015-03-09 SURGICAL SUPPLY — 31 items
BLADE SAW SGTL MED 73X18.5 STR (BLADE) IMPLANT
BNDG COHESIVE 4X5 TAN STRL (GAUZE/BANDAGES/DRESSINGS) ×2 IMPLANT
BNDG COHESIVE 6X5 TAN STRL LF (GAUZE/BANDAGES/DRESSINGS) ×1 IMPLANT
BNDG GAUZE ELAST 4 BULKY (GAUZE/BANDAGES/DRESSINGS) ×2 IMPLANT
COVER SURGICAL LIGHT HANDLE (MISCELLANEOUS) ×4 IMPLANT
DRAPE U-SHAPE 47X51 STRL (DRAPES) ×4 IMPLANT
DRSG ADAPTIC 3X8 NADH LF (GAUZE/BANDAGES/DRESSINGS) ×2 IMPLANT
DRSG PAD ABDOMINAL 8X10 ST (GAUZE/BANDAGES/DRESSINGS) ×3 IMPLANT
DURAPREP 26ML APPLICATOR (WOUND CARE) ×2 IMPLANT
ELECT REM PT RETURN 9FT ADLT (ELECTROSURGICAL) ×2
ELECTRODE REM PT RTRN 9FT ADLT (ELECTROSURGICAL) ×1 IMPLANT
GAUZE SPONGE 4X4 12PLY STRL (GAUZE/BANDAGES/DRESSINGS) ×2 IMPLANT
GLOVE BIOGEL PI IND STRL 9 (GLOVE) ×1 IMPLANT
GLOVE BIOGEL PI INDICATOR 9 (GLOVE) ×1
GLOVE SURG ORTHO 9.0 STRL STRW (GLOVE) ×2 IMPLANT
GOWN STRL REUS W/ TWL LRG LVL3 (GOWN DISPOSABLE) ×1 IMPLANT
GOWN STRL REUS W/ TWL XL LVL3 (GOWN DISPOSABLE) ×2 IMPLANT
GOWN STRL REUS W/TWL LRG LVL3 (GOWN DISPOSABLE) ×2
GOWN STRL REUS W/TWL XL LVL3 (GOWN DISPOSABLE) ×4
KIT BASIN OR (CUSTOM PROCEDURE TRAY) ×2 IMPLANT
KIT ROOM TURNOVER OR (KITS) ×2 IMPLANT
NS IRRIG 1000ML POUR BTL (IV SOLUTION) ×2 IMPLANT
PACK ORTHO EXTREMITY (CUSTOM PROCEDURE TRAY) ×2 IMPLANT
PAD ARMBOARD 7.5X6 YLW CONV (MISCELLANEOUS) ×4 IMPLANT
SPONGE LAP 18X18 X RAY DECT (DISPOSABLE) ×2 IMPLANT
STOCKINETTE IMPERVIOUS LG (DRAPES) ×1 IMPLANT
SUT ETHILON 2 0 PSLX (SUTURE) ×4 IMPLANT
TOWEL OR 17X24 6PK STRL BLUE (TOWEL DISPOSABLE) ×2 IMPLANT
TOWEL OR 17X26 10 PK STRL BLUE (TOWEL DISPOSABLE) ×2 IMPLANT
UNDERPAD 30X30 INCONTINENT (UNDERPADS AND DIAPERS) ×2 IMPLANT
WATER STERILE IRR 1000ML POUR (IV SOLUTION) ×2 IMPLANT

## 2015-03-09 NOTE — Progress Notes (Signed)
Patient presents to clinic today c/o worsening infection in L great toe first diagnosed 2 weeks ago per patient. Infection started from cut secondary to having a pedicure. Patient has uncontrolled diabetes and hx of R hemi foot amputation. Was given Rx Bactrim by wound specialist. Due to worsening symptoms MRI was performed and positive for osteomyelitis. Patient switched to Ciprofloxacin. Since then denies fever but endorses redness and swelling tracking up her foot. Endorses top of toe turn black and "fell off" when she was changing her bandages. Denies any pain in extremity.  Past Medical History  Diagnosis Date  . Bipolar 1 disorder ([place])   . Hypertension     past hx of  . H/O hiatal hernia   . Numbness and tingling     Hx; of in B/LLE and B/LUE  . Hyperlipidemia   . Diabetes mellitus     INSULIN DEPENDENT  . Diabetic neuropathy ([place])   . Diabetic neuropathy ([place])   . Cellulitis and abscess of foot 03/08/2015    No current facility-administered medications on file prior to visit.   Current Outpatient Prescriptions on File Prior to Visit  Medication Sig Dispense Refill  . aspirin 81 MG tablet Take 81 mg by mouth daily.    Marland Kitchen atorvastatin (LIPITOR) 20 MG tablet Take 1 tablet (20 mg total) by mouth at bedtime. 30 tablet 5  . escitalopram (LEXAPRO) 5 MG tablet Take 7.5 mg by mouth every morning.     . gabapentin (NEURONTIN) 300 MG capsule Take 600 mg each at breakfast and lunch. Take 900 mg at night. (Patient taking differently: 600-900 mg 3 (three) times daily. Take 2 capsules at breakfast & lunch Take 3 capsules at bedtime) 210 capsule 5  . Insulin Detemir (LEVEMIR FLEXTOUCH) 100 UNIT/ML Pen Increase by 2 units every 3-4 days until Am glucose 80-130 (Patient taking differently: Inject 30-34 Units into the skin at bedtime. ) 15 mL 5  . metFORMIN (GLUCOPHAGE) 500 MG tablet Take 1000 mg each morning and 500 mg each evening (Patient taking differently: Take 500-1,000 mg by mouth 2  (two) times daily with a meal. Take 2 tablets in the morning Take 1 tablet in the evening) 90 tablet 5  . ramipril (ALTACE) 2.5 MG capsule Take 1 capsule (2.5 mg total) by mouth daily. 30 capsule 3    Allergies  Allergen Reactions  . Ativan [Lorazepam] Other (See Comments)    Abnormal behavior  . Darvocet [Propoxyphene N-Acetaminophen] Itching  . Demerol [Meperidine] Other (See Comments)    Abnormal behavior  . Latex Itching  . Lithium Nausea And Vomiting    Can not keep this medication down. It makes her terribly ill.  . Oxycodone Other (See Comments)    Abnormal behavior    Family History  Problem Relation Age of Onset  . Diabetes Mother   . Mental illness Mother   . Hypertension Father     Social History   Social History  . Marital Status: Married    Spouse Name: N/A  . Number of Children: N/A  . Years of Education: N/A   Social History Main Topics  . Smoking status: Never Smoker   . Smokeless tobacco: Never Used  . Alcohol Use: No  . Drug Use: No  . Sexual Activity: Not Asked   Other Topics Concern  . None   Social History Narrative    Review of Systems - See HPI.  All other ROS are negative.  BP 86/59 mmHg  Pulse 81  Temp(Src) 98 F (36.7 C) (Oral)  Resp 16  Ht '5\' 1"'  (1.549 m)  Wt 160 lb 4 oz (72.689 kg)  BMI 30.29 kg/m2  SpO2 100%  Physical Exam  Constitutional: She is oriented to person, place, and time and well-developed, well-nourished, and in no distress.  HENT:  Head: Normocephalic and atraumatic.  Cardiovascular: Normal rate, regular rhythm, normal heart sounds and intact distal pulses.   Pulmonary/Chest: Effort normal and breath sounds normal. No respiratory distress. She has no wheezes. She has no rales. She exhibits no tenderness.  Neurological: She is alert and oriented to person, place, and time.  Skin:     Vitals reviewed.   Recent Results (from the past 2160 hour(s))  Basic Metabolic Panel (BMET)     Status: Abnormal    Collection Time: 02/21/15  2:47 PM  Result Value Ref Range   Sodium 132 (L) 135 - 145 mEq/L   Potassium 4.8 3.5 - 5.1 mEq/L   Chloride 94 (L) 96 - 112 mEq/L   CO2 33 (H) 19 - 32 mEq/L   Glucose, Bld 463 (H) 70 - 99 mg/dL   BUN 16 6 - 23 mg/dL   Creatinine, Ser 0.89 0.40 - 1.20 mg/dL   Calcium 9.2 8.4 - 10.5 mg/dL   GFR 69.22 >60.00 mL/min  Hemoglobin A1c     Status: Abnormal   Collection Time: 02/21/15  2:47 PM  Result Value Ref Range   Hgb A1c MFr Bld 13.0 (H) 4.6 - 6.5 %    Comment: Glycemic Control Guidelines for People with Diabetes:Non Diabetic:  <6%Goal of Therapy: <7%Additional Action Suggested:  >8%   Comprehensive metabolic panel     Status: Abnormal   Collection Time: 03/07/15  7:15 PM  Result Value Ref Range   Sodium 132 (L) 135 - 145 mmol/L   Potassium 4.5 3.5 - 5.1 mmol/L   Chloride 98 (L) 101 - 111 mmol/L   CO2 27 22 - 32 mmol/L   Glucose, Bld 199 (H) 65 - 99 mg/dL   BUN 28 (H) 6 - 20 mg/dL   Creatinine, Ser 0.95 0.44 - 1.00 mg/dL   Calcium 9.1 8.9 - 10.3 mg/dL   Total Protein 8.3 (H) 6.5 - 8.1 g/dL   Albumin 2.9 (L) 3.5 - 5.0 g/dL   AST 16 15 - 41 U/L   ALT 17 14 - 54 U/L   Alkaline Phosphatase 124 38 - 126 U/L   Total Bilirubin 0.4 0.3 - 1.2 mg/dL   GFR calc non Af Amer >60 >60 mL/min   GFR calc Af Amer >60 >60 mL/min    Comment: (NOTE) The eGFR has been calculated using the CKD EPI equation. This calculation has not been validated in all clinical situations. eGFR's persistently <60 mL/min signify possible Chronic Kidney Disease.    Anion gap 7 5 - 15  CBC with Differential     Status: Abnormal   Collection Time: 03/07/15  7:15 PM  Result Value Ref Range   WBC 11.1 (H) 4.0 - 10.5 K/uL   RBC 3.29 (L) 3.87 - 5.11 MIL/uL   Hemoglobin 8.6 (L) 12.0 - 15.0 g/dL   HCT 27.0 (L) 36.0 - 46.0 %   MCV 82.1 78.0 - 100.0 fL   MCH 26.1 26.0 - 34.0 pg   MCHC 31.9 30.0 - 36.0 g/dL   RDW 14.2 11.5 - 15.5 %   Platelets 565 (H) 150 - 400 K/uL   Neutrophils Relative %  70 %   Neutro Abs  7.8 (H) 1.7 - 7.7 K/uL   Lymphocytes Relative 19 %   Lymphs Abs 2.2 0.7 - 4.0 K/uL   Monocytes Relative 8 %   Monocytes Absolute 0.8 0.1 - 1.0 K/uL   Eosinophils Relative 3 %   Eosinophils Absolute 0.3 0.0 - 0.7 K/uL   Basophils Relative 0 %   Basophils Absolute 0.0 0.0 - 0.1 K/uL  Culture, blood (routine x 2)     Status: None (Preliminary result)   Collection Time: 03/07/15  7:20 PM  Result Value Ref Range   Specimen Description BLOOD LEFT ANTECUBITAL    Special Requests BOTTLES DRAWN AEROBIC AND ANAEROBIC 5CC EACH    Culture      NO GROWTH 2 DAYS Performed at [facility]    Report Status PENDING   Culture, blood (routine x 2)     Status: None (Preliminary result)   Collection Time: 03/07/15  7:25 PM  Result Value Ref Range   Specimen Description BLOOD RIGHT ANTECUBITAL    Special Requests BOTTLES DRAWN AEROBIC AND ANAEROBIC 5ML EACH    Culture      NO GROWTH 2 DAYS Performed at [facility]    Report Status PENDING   I-Stat CG4 Lactic Acid, ED     Status: None   Collection Time: 03/07/15  7:26 PM  Result Value Ref Range   Lactic Acid, Venous 1.35 0.5 - 2.0 mmol/L  Glucose, capillary     Status: Abnormal   Collection Time: 03/07/15 11:07 PM  Result Value Ref Range   Glucose-Capillary 187 (H) 65 - 99 mg/dL  Basic metabolic panel     Status: Abnormal   Collection Time: 03/08/15  5:05 AM  Result Value Ref Range   Sodium 133 (L) 135 - 145 mmol/L   Potassium 4.4 3.5 - 5.1 mmol/L   Chloride 99 (L) 101 - 111 mmol/L   CO2 29 22 - 32 mmol/L   Glucose, Bld 196 (H) 65 - 99 mg/dL   BUN 16 6 - 20 mg/dL   Creatinine, Ser 0.77 0.44 - 1.00 mg/dL   Calcium 8.8 (L) 8.9 - 10.3 mg/dL   GFR calc non Af Amer >60 >60 mL/min   GFR calc Af Amer >60 >60 mL/min    Comment: (NOTE) The eGFR has been calculated using the CKD EPI equation. This calculation has not been validated in all clinical situations. eGFR's persistently <60 mL/min signify possible  Chronic Kidney Disease.    Anion gap 5 5 - 15  CBC     Status: Abnormal   Collection Time: 03/08/15  5:05 AM  Result Value Ref Range   WBC 6.8 4.0 - 10.5 K/uL   RBC 3.01 (L) 3.87 - 5.11 MIL/uL   Hemoglobin 8.0 (L) 12.0 - 15.0 g/dL   HCT 24.6 (L) 36.0 - 46.0 %   MCV 81.7 78.0 - 100.0 fL   MCH 26.6 26.0 - 34.0 pg   MCHC 32.5 30.0 - 36.0 g/dL   RDW 14.6 11.5 - 15.5 %   Platelets 452 (H) 150 - 400 K/uL  Protime-INR     Status: Abnormal   Collection Time: 03/08/15  5:05 AM  Result Value Ref Range   Prothrombin Time 16.3 (H) 11.6 - 15.2 seconds   INR 1.29 0.00 - 1.49  Lithium level     Status: Abnormal   Collection Time: 03/08/15  5:05 AM  Result Value Ref Range   Lithium Lvl <0.06 (L) 0.60 - 1.20 mmol/L  Glucose, capillary  Status: Abnormal   Collection Time: 03/08/15  7:21 AM  Result Value Ref Range   Glucose-Capillary 158 (H) 65 - 99 mg/dL  Glucose, capillary     Status: Abnormal   Collection Time: 03/08/15 11:33 AM  Result Value Ref Range   Glucose-Capillary 220 (H) 65 - 99 mg/dL  Glucose, capillary     Status: Abnormal   Collection Time: 03/08/15  4:54 PM  Result Value Ref Range   Glucose-Capillary 240 (H) 65 - 99 mg/dL  Glucose, capillary     Status: Abnormal   Collection Time: 03/08/15  9:39 PM  Result Value Ref Range   Glucose-Capillary 215 (H) 65 - 99 mg/dL   Comment 1 Notify RN    Comment 2 Document in Chart   Lipid panel     Status: Abnormal   Collection Time: 03/09/15  4:45 AM  Result Value Ref Range   Cholesterol 103 0 - 200 mg/dL   Triglycerides 96 <150 mg/dL   HDL 27 (L) >40 mg/dL   Total CHOL/HDL Ratio 3.8 RATIO   VLDL 19 0 - 40 mg/dL   LDL Cholesterol 57 0 - 99 mg/dL    Comment:        Total Cholesterol/HDL:CHD Risk Coronary Heart Disease Risk Table                     Men   Women  1/2 Average Risk   3.4   3.3  Average Risk       5.0   4.4  2 X Average Risk   9.6   7.1  3 X Average Risk  23.4   11.0        Use the calculated Patient  Ratio above and the CHD Risk Table to determine the patient's CHD Risk.        ATP III CLASSIFICATION (LDL):  <100     mg/dL   Optimal  100-129  mg/dL   Near or Above                    Optimal  130-159  mg/dL   Borderline  160-189  mg/dL   High  >190     mg/dL   Very High   Surgical pcr screen     Status: None   Collection Time: 03/09/15  5:15 AM  Result Value Ref Range   MRSA, PCR NEGATIVE NEGATIVE   Staphylococcus aureus NEGATIVE NEGATIVE    Comment:        The Xpert SA Assay (FDA approved for NASAL specimens in patients over 25 years of age), is one component of a comprehensive surveillance program.  Test performance has been validated by [facility] for patients greater than or equal to 48 year old. It is not intended to diagnose infection nor to guide or monitor treatment.   Glucose, capillary     Status: Abnormal   Collection Time: 03/09/15  6:14 AM  Result Value Ref Range   Glucose-Capillary 149 (H) 65 - 99 mg/dL   Comment 1 Notify RN    Comment 2 Document in Chart   Glucose, capillary     Status: Abnormal   Collection Time: 03/09/15 12:01 PM  Result Value Ref Range   Glucose-Capillary 129 (H) 65 - 99 mg/dL    Assessment/Plan: Cellulitis and abscess of toe With necrotic tissue and sloughing. Exposed bone. Cellulitis worsening per patient despite antibiotics. Spoke with MD on call in our ER who agrees patient needs emergent  assessment and admission with Ortho consultation for amputation. Patient taken directly to ER by CMA.

## 2015-03-09 NOTE — Clinical Documentation Improvement (Signed)
Internal Medicine  Abnormal Lab/Test Results:  03/07/15: Na= 132; 03/08/15: Na= 133  Possible Clinical Conditions associated with below indicators  Hyponatremia  Other Condition  Cannot Clinically Determine  Treatment Provided: IVF's  Please exercise your independent, professional judgment when responding. A specific answer is not anticipated or expected.   Thank You,  Rolm Gala, RN, [place] [phone]

## 2015-03-09 NOTE — Progress Notes (Signed)
Orthopedic Tech Progress Note Patient Details:  Dana [name] 08/30/1955 [id number]  Ortho Devices Type of Ortho Device: Postop shoe/boot Ortho Device/Splint Location: Bilateral post op shoes   [name] 03/09/2015, 7:16 PM

## 2015-03-09 NOTE — Transfer of Care (Signed)
Immediate Anesthesia Transfer of Care Note  Patient: Dana [name]  Procedure(s) Performed: Procedure(s): LEFT FOOT 1ST RAY AMPUTATION (Left)  Patient Location: PACU  Anesthesia Type:General  Level of Consciousness: awake, alert , oriented, patient cooperative and responds to stimulation  Airway & Oxygen Therapy: Patient Spontanous Breathing and Patient connected to nasal cannula oxygen  Post-op Assessment: Report given to RN, Post -op Vital signs reviewed and stable, Patient moving all extremities, Patient moving all extremities X 4 and Patient able to stick tongue midline  Post vital signs: Reviewed and stable  Last Vitals:  Filed Vitals:   03/09/15 1600  BP:   Pulse: 77  Temp: 36.4 C  Resp: 14    Complications: No apparent anesthesia complications

## 2015-03-09 NOTE — Interval H&P Note (Signed)
History and Physical Interval Note:  03/09/2015 6:28 AM  Dana [name]  has presented today for surgery, with the diagnosis of Osteomyelitis Left Great toe  The various methods of treatment have been discussed with the patient and family. After consideration of risks, benefits and other options for treatment, the patient has consented to  Procedure(s): LEFT FOOT 1ST RAY AMPUTATION (Left) as a surgical intervention .  The patient's history has been reviewed, patient examined, no change in status, stable for surgery.  I have reviewed the patient's chart and labs.  Questions were answered to the patient's satisfaction.     [name]

## 2015-03-09 NOTE — Progress Notes (Signed)
[facility] PROGRESS NOTE  Dana [name] [id number] DOB: 1955-10-02 DOA: 03/07/2015 PCP: [name], PA-C   HPI/Subjective: Ms. Dana [name] is a 59 y/o female with PMH Type II DM with peripheral neuropathy/PVD, HLD and past transmetatarsal amputation of the right foot. She presented to the ED 10/27 with complaints of left toe ulcer and pain. Symptoms began three weeks ago after she got a pedicure, wound care had been following her and started her on PO antibiotics. The ulcer continued to worsen and wound clinic recommended she come to the ED for evaluation. Xray of the left great toe showed osteomyelitis, orthopedic surgery consulted with plan for left great toe amputation 10/28.   Assessment/Plan: 1. Abscess with osteomyelitis of L Great Toe  - Patient presented to the ED 10/26 with left toe pain and ulcer after a pedicure x 3 weeks ago - Seen by wound care outpatient, started on PO abx  - Orthopedic surgery consulted and plan for ray amputation today at 2 pm -Will need to discuss disposition plan with ortho as husband is in wheelchair and patient does not have adequate assistance at home following surgery. - Continue Vanc and Zosyn  2. Possible Osteomyelitis of R transmetatarsal amputation   - At time of presentation patient was also found to have a large ulcer on plantar aspect of the foot with palpable bone  - Ortho surgery consulted with xray showing soft tissue ulceration without findings of osteomyelitis  - Likely will need BKA on the right in the future, ortho to discuss further with the patient  - Continue Vanc and Zosyn   3. DM Type II   - Long-term history of uncontrolled Type II DM with peripheral neuropathy and PVD  - Hgb A1C 13.0 on 02/21/15 ( average CBG of 326) - Blood sugars stable, fasting BG 149 this AM  - Continue Novolog sliding scale with meals and Levemir qHS  4. Hyperlipidemia  - LDL 10/28 - 57 - Continue Lipitor  5. Bipolar Disorder - Lithium  level <0.06 - Continue Lithium     Code Status: Full Code Family Communication: No family present at bedside at this time  Disposition Plan: TBD, L great toe amputation scheduled today    Consultants:  Orthopedic Surgery  Procedures:  None   Antibiotics:  Vancomycin   Zosyn    Objective: Filed Vitals:   03/09/15 0513  BP: 100/52  Pulse: 75  Temp: 98.1 F (36.7 C)  Resp: 18    Intake/Output Summary (Last 24 hours) at 03/09/15 1015 Last data filed at 03/08/15 2100  Gross per 24 hour  Intake    680 ml  Output      2 ml  Net    678 ml   Filed Weights   03/07/15 2300  Weight: 72.7 kg (160 lb 4.4 oz)    Exam:   General:  Pleasant female, sitting upright in bed, no acute distress  Cardiovascular: Regular rate and rhythm, 3/6 systolic murmur   Respiratory: Breath sounds clear and equal bilaterally. No rales, rhonchi or wheezes.  Abdomen: Bowel sounds present in all 4 quadrants. Abdomen is soft, non-tender.   Musculoskeletal: Left great toe ulcerated with significant cellulitis and purulent drainage, bone is visible at base of wound. Strong pedal pulse in the left foot. Right foot with past transmetatarsal amputation, ulceration present on lateral plantar surface, no drainage.    Data Reviewed: Basic Metabolic Panel:  Recent Labs Lab 03/07/15 1915 03/08/15 0505  NA 132* 133*  K  4.5 4.4  CL 98* 99*  CO2 27 29  GLUCOSE 199* 196*  BUN 28* 16  CREATININE 0.95 0.77  CALCIUM 9.1 8.8*   Liver Function Tests:  Recent Labs Lab 03/07/15 1915  AST 16  ALT 17  ALKPHOS 124  BILITOT 0.4  PROT 8.3*  ALBUMIN 2.9*   No results for input(s): LIPASE, AMYLASE in the last 168 hours. No results for input(s): AMMONIA in the last 168 hours. CBC:  Recent Labs Lab 03/07/15 1915 03/08/15 0505  WBC 11.1* 6.8  NEUTROABS 7.8*  --   HGB 8.6* 8.0*  HCT 27.0* 24.6*  MCV 82.1 81.7  PLT 565* 452*   CBG:  Recent Labs Lab 03/08/15 0721 03/08/15 1133  03/08/15 1654 03/08/15 2139 03/09/15 0614  GLUCAP 158* 220* 240* 215* 149*    Recent Results (from the past 240 hour(s))  Culture, blood (routine x 2)     Status: None (Preliminary result)   Collection Time: 03/07/15  7:20 PM  Result Value Ref Range Status   Specimen Description BLOOD LEFT ANTECUBITAL  Final   Special Requests BOTTLES DRAWN AEROBIC AND ANAEROBIC 5CC EACH  Final   Culture   Final    NO GROWTH < 24 HOURS Performed at [facility]    Report Status PENDING  Incomplete  Culture, blood (routine x 2)     Status: None (Preliminary result)   Collection Time: 03/07/15  7:25 PM  Result Value Ref Range Status   Specimen Description BLOOD RIGHT ANTECUBITAL  Final   Special Requests BOTTLES DRAWN AEROBIC AND ANAEROBIC 5ML EACH  Final   Culture   Final    NO GROWTH < 24 HOURS Performed at [facility]    Report Status PENDING  Incomplete  Surgical pcr screen     Status: None   Collection Time: 03/09/15  5:15 AM  Result Value Ref Range Status   MRSA, PCR NEGATIVE NEGATIVE Final   Staphylococcus aureus NEGATIVE NEGATIVE Final    Comment:        The Xpert SA Assay (FDA approved for NASAL specimens in patients over 76 years of age), is one component of a comprehensive surveillance program.  Test performance has been validated by [facility] for patients greater than or equal to 18 year old. It is not intended to diagnose infection nor to guide or monitor treatment.      Studies: Dg Foot 2 Views Left  03/08/2015  CLINICAL DATA:  Left great toe infection EXAM: LEFT FOOT - 2 VIEW COMPARISON:  MRI of the left forefoot dated March 05, 2015 FINDINGS: There is loss of the soft tissues over the distal third of the distal phalanx of the great toe. There is exposure of the tuft of the distal phalanx. There is loss of the sharp cortical margin of the lateral 1/2 of the anterior surface of the tuft of the distal phalanx. The IP joint is normal. The proximal  phalanx is unremarkable. The second through fifth phalanges exhibit no acute abnormalities. The MTP joints exhibit no significant degenerative change. There are changes consistent with Charcot joints of the intertarsal and tarsometatarsal joints. There are large plantar calcaneal spurs. There is soft tissue swelling over the dorsum of the midfoot. IMPRESSION: 1. Subtle changes of osteomyelitis associated with the anterior surface of the tuft of the great toe. There is loss of the soft tissues over the distal aspect of the toe which may be postsurgical or reflect known cellulitis. 2. Charcot changes of  the tarsometatarsal and intertarsal joints. Electronically Signed   By: David  Dana [name] M.D.   On: 03/08/2015 08:14   Dg Foot Complete Right  03/08/2015  CLINICAL DATA:  1st toe infection, right foot ulcer on bottom of foot EXAM: RIGHT FOOT COMPLETE - 3+ VIEW COMPARISON:  MRI foot dated 08/25/2014 FINDINGS: Status post forefoot amputation. Soft tissue ulceration along the plantar aspect of the midfoot. Associated soft tissue swelling. No definite cortical irregularity along the overlying bone to suggest osteomyelitis. However, this is not considered a sensitive evaluation. IMPRESSION: Status post forefoot amputation. Soft tissue ulceration along the plantar aspect of the midfoot with associated soft tissue swelling. No definite radiographic findings of osteomyelitis, noting that this is not a sensitive evaluation. Electronically Signed   By: Julian Hy M.D.   On: 03/08/2015 08:16    Scheduled Meds: . atorvastatin  20 mg Oral QHS  . escitalopram  7.5 mg Oral q morning - 10a  . gabapentin  600 mg Oral TID  . heparin  5,000 Units Subcutaneous 3 times per day  . insulin aspart  0-15 Units Subcutaneous TID WC  . insulin detemir  30 Units Subcutaneous QHS  . lithium carbonate  300 mg Oral Q supper  . lithium carbonate  600 mg Oral BID AC & HS  . piperacillin-tazobactam (ZOSYN)  IV  3.375 g Intravenous  Q8H  . ramipril  2.5 mg Oral Daily  . sodium chloride  3 mL Intravenous Q12H  . vancomycin  750 mg Intravenous Q12H   Continuous Infusions:   Active Problems:   Cellulitis and abscess of toe   Diabetic ulcer of toe ([place])    Time spent: 35 minutes     Vornheder, Katie PA-S  [facility] Pager [phone] If 7PM-7AM, please contact night-coverage at [email or web address], password [facility] 03/09/2015, 10:15 AM  LOS: 2 days      Addendum  Patient seen and examined, chart and data base reviewed.  I agree with the above assessment and plan.  For full details please see Mrs. Elita Boone PA-S note.  I reviewed and amended the above note as appropriate.   Birdie Hopes, MD [facility] Pager: [phone] 03/09/2015, 10:15 AM

## 2015-03-09 NOTE — Telephone Encounter (Signed)
Paperwork given to CMA for mailing.

## 2015-03-09 NOTE — Assessment & Plan Note (Signed)
With necrotic tissue and sloughing. Exposed bone. Cellulitis worsening per patient despite antibiotics. Spoke with MD on call in our ER who agrees patient needs emergent assessment and admission with Ortho consultation for amputation. Patient taken directly to ER by CMA.

## 2015-03-09 NOTE — Op Note (Signed)
03/07/2015 - 03/09/2015  3:12 PM  PATIENT:  Dana [name]    PRE-OPERATIVE DIAGNOSIS:  Osteomyelitis Left Great toe  POST-OPERATIVE DIAGNOSIS:  Same  PROCEDURE:  LEFT FOOT 1ST RAY AMPUTATION Local tissue rearrangement for wound closure 8 x 5 cm.  SURGEON:  Newt Minion, MD  PHYSICIAN ASSISTANT:None ANESTHESIA:   General  PREOPERATIVE INDICATIONS:  [name] is a  59 y.o. female with a diagnosis of Osteomyelitis Left Great toe who failed conservative measures and elected for surgical management.    The risks benefits and alternatives were discussed with the patient preoperatively including but not limited to the risks of infection, bleeding, nerve injury, cardiopulmonary complications, the need for revision surgery, among others, and the patient was willing to proceed.  OPERATIVE IMPLANTS: None  OPERATIVE FINDINGS: Good petechial bleeding  OPERATIVE PROCEDURE: Patient was brought to the operating room and underwent a general anesthetic. After adequate levels anesthesia were obtained patient's left lower extremity was prepped using DuraPrep draped into a sterile field. A timeout was called. A racquet incision was made along the medial border of the first ray with the ulcerative toe and first ray resected in 1 block of tissue. Electrocautery was used for hemostasis the wound was irrigated with normal saline. There was good petechial bleeding. Uncle tissue rearrangement was performed to close a wound which was 8 x 5 cm. Wound was closed using 2-0 nylon. A sterile compressive dressing was applied. Patient was extubated taken to the PACU in stable condition.

## 2015-03-09 NOTE — Anesthesia Procedure Notes (Signed)
Procedure Name: LMA Insertion Date/Time: 03/09/2015 2:58 PM Performed by: Vennie Homans Pre-anesthesia Checklist: Patient identified, Emergency Drugs available, Suction available, Patient being monitored and Timeout performed Patient Re-evaluated:Patient Re-evaluated prior to inductionOxygen Delivery Method: Circle system utilized Preoxygenation: Pre-oxygenation with 100% oxygen Intubation Type: IV induction LMA: LMA inserted LMA Size: 4.0 Placement Confirmation: positive ETCO2 and breath sounds checked- equal and bilateral Tube secured with: Tape Dental Injury: Teeth and Oropharynx as per pre-operative assessment

## 2015-03-09 NOTE — H&P (View-Only) (Signed)
Reason for Consult: Abscess osteomyelitis left great toe and abscess osteomyelitis right transmetatarsal amputation Referring Physician: Dr. Nitka  Dana [name] is an 59 y.o. female.  HPI: Patient is a 59 year old woman who reports a history of recent pedicure on the left and states that the necrotic changes happened after the pedicure. Patient also states that she has been going to the wound center and [facility] has had wound VAC treatment for the right transmetatarsal amputation has a large ulcer which extends down to bone.  Past Medical History  Diagnosis Date  . Diabetes mellitus   . Bipolar 1 disorder ([place])   . Hypertension     past hx of  . H/O hiatal hernia   . Numbness and tingling     Hx; of in B/LLE and B/LUE  . Hyperlipidemia     Past Surgical History  Procedure Laterality Date  . Breast surgery    . Abdominal hysterectomy    . Colon surgery    . Nasal septum surgery    . Amputation  10/24/2011    Procedure: AMPUTATION RAY;  Surgeon: Newt Minion, MD;  Location: [place];  Service: Orthopedics;  Laterality: Right;  Right Foot 3rd Ray Amputation  . Tonsillectomy    . Adenoidectomy      Hx: of  . Amputation Right 12/16/2012    Procedure: Right Foot Transmetatarsal Amputation;  Surgeon: Newt Minion, MD;  Location: [place];  Service: Orthopedics;  Laterality: Right;  . Bladder tact   2002    Family History  Problem Relation Age of Onset  . Diabetes Mother   . Mental illness Mother   . Hypertension Father     Social History:  reports that she has never smoked. She has never used smokeless tobacco. She reports that she does not drink alcohol or use illicit drugs.  Allergies:  Allergies  Allergen Reactions  . Ativan [Lorazepam] Other (See Comments)    Abnormal behavior  . Darvocet [Propoxyphene N-Acetaminophen] Itching  . Demerol [Meperidine] Other (See Comments)    Abnormal behavior  . Latex Itching  . Oxycodone Other (See Comments)    Abnormal behavior     Medications: I have reviewed the patient's current medications.  Results for orders placed or performed during the hospital encounter of 03/07/15 (from the past 48 hour(s))  Comprehensive metabolic panel     Status: Abnormal   Collection Time: 03/07/15  7:15 PM  Result Value Ref Range   Sodium 132 (L) 135 - 145 mmol/L   Potassium 4.5 3.5 - 5.1 mmol/L   Chloride 98 (L) 101 - 111 mmol/L   CO2 27 22 - 32 mmol/L   Glucose, Bld 199 (H) 65 - 99 mg/dL   BUN 28 (H) 6 - 20 mg/dL   Creatinine, Ser 0.95 0.44 - 1.00 mg/dL   Calcium 9.1 8.9 - 10.3 mg/dL   Total Protein 8.3 (H) 6.5 - 8.1 g/dL   Albumin 2.9 (L) 3.5 - 5.0 g/dL   AST 16 15 - 41 U/L   ALT 17 14 - 54 U/L   Alkaline Phosphatase 124 38 - 126 U/L   Total Bilirubin 0.4 0.3 - 1.2 mg/dL   GFR calc non Af Amer >60 >60 mL/min   GFR calc Af Amer >60 >60 mL/min    Comment: (NOTE) The eGFR has been calculated using the CKD EPI equation. This calculation has not been validated in all clinical situations. eGFR's persistently <60 mL/min signify possible Chronic Kidney Disease.  Anion gap 7 5 - 15  CBC with Differential     Status: Abnormal   Collection Time: 03/07/15  7:15 PM  Result Value Ref Range   WBC 11.1 (H) 4.0 - 10.5 K/uL   RBC 3.29 (L) 3.87 - 5.11 MIL/uL   Hemoglobin 8.6 (L) 12.0 - 15.0 g/dL   HCT 27.0 (L) 36.0 - 46.0 %   MCV 82.1 78.0 - 100.0 fL   MCH 26.1 26.0 - 34.0 pg   MCHC 31.9 30.0 - 36.0 g/dL   RDW 14.2 11.5 - 15.5 %   Platelets 565 (H) 150 - 400 K/uL   Neutrophils Relative % 70 %   Neutro Abs 7.8 (H) 1.7 - 7.7 K/uL   Lymphocytes Relative 19 %   Lymphs Abs 2.2 0.7 - 4.0 K/uL   Monocytes Relative 8 %   Monocytes Absolute 0.8 0.1 - 1.0 K/uL   Eosinophils Relative 3 %   Eosinophils Absolute 0.3 0.0 - 0.7 K/uL   Basophils Relative 0 %   Basophils Absolute 0.0 0.0 - 0.1 K/uL  Culture, blood (routine x 2)     Status: None (Preliminary result)   Collection Time: 03/07/15  7:20 PM  Result Value Ref Range    Specimen Description BLOOD LEFT ANTECUBITAL    Special Requests BOTTLES DRAWN AEROBIC AND ANAEROBIC 5CC EACH    Culture PENDING    Report Status PENDING   Culture, blood (routine x 2)     Status: None (Preliminary result)   Collection Time: 03/07/15  7:25 PM  Result Value Ref Range   Specimen Description BLOOD RIGHT ANTECUBITAL    Special Requests BOTTLES DRAWN AEROBIC AND ANAEROBIC 5ML EACH    Culture PENDING    Report Status PENDING   I-Stat CG4 Lactic Acid, ED     Status: None   Collection Time: 03/07/15  7:26 PM  Result Value Ref Range   Lactic Acid, Venous 1.35 0.5 - 2.0 mmol/L  Glucose, capillary     Status: Abnormal   Collection Time: 03/07/15 11:07 PM  Result Value Ref Range   Glucose-Capillary 187 (H) 65 - 99 mg/dL  Basic metabolic panel     Status: Abnormal   Collection Time: 03/08/15  5:05 AM  Result Value Ref Range   Sodium 133 (L) 135 - 145 mmol/L   Potassium 4.4 3.5 - 5.1 mmol/L   Chloride 99 (L) 101 - 111 mmol/L   CO2 29 22 - 32 mmol/L   Glucose, Bld 196 (H) 65 - 99 mg/dL   BUN 16 6 - 20 mg/dL   Creatinine, Ser 0.77 0.44 - 1.00 mg/dL   Calcium 8.8 (L) 8.9 - 10.3 mg/dL   GFR calc non Af Amer >60 >60 mL/min   GFR calc Af Amer >60 >60 mL/min    Comment: (NOTE) The eGFR has been calculated using the CKD EPI equation. This calculation has not been validated in all clinical situations. eGFR's persistently <60 mL/min signify possible Chronic Kidney Disease.    Anion gap 5 5 - 15  Protime-INR     Status: Abnormal   Collection Time: 03/08/15  5:05 AM  Result Value Ref Range   Prothrombin Time 16.3 (H) 11.6 - 15.2 seconds   INR 1.29 0.00 - 1.49  Lithium level     Status: Abnormal   Collection Time: 03/08/15  5:05 AM  Result Value Ref Range   Lithium Lvl <0.06 (L) 0.60 - 1.20 mmol/L    No results found.  Review of Systems    All other systems reviewed and are negative.  Blood pressure 109/66, pulse 88, temperature 98.8 F (37.1 C), temperature source Oral,  resp. rate 18, height 5' 1" (1.549 m), weight 72.7 kg (160 lb 4.4 oz), SpO2 95 %. Physical Exam On examination patient has a good dorsalis pedis pulse. Examination the left foot she has a large necrotic ulcer of the great toe with cellulitis up to the MTP joint. The distal tuft of the toe has fallen off. The IP joint is exposed with necrotic tissue proximal to the IP joint. Examination the right foot she has a large ulcer over the plantar aspect of the transmetatarsal amputation. This ulcer extends down to bone the bone is palpable. Assessment/Plan: Assessment: Abscess osteomyelitis partial amputation of the left great toe due to necrosis with osteomyelitis of the right transmetatarsal amputation.  Plan: We will plan for a first ray amputation the left tomorrow afternoon Friday. I will obtain x-rays of the right foot. Anticipate patient will need a below the knee amputation on the right I have not discussed this with her. This may need to be a staged procedure to allow her to weight-bear on the right foot while the left foot is healing. Once the left foot is healed then she could proceed with a transtibial amputation on the right. I will discuss this with her once I have radiographs.  Rokhaya Quinn V 03/08/2015, 6:42 AM

## 2015-03-09 NOTE — Telephone Encounter (Signed)
Hospital paperwork placed in envelope & outgoing mail bin/SLS

## 2015-03-09 NOTE — Anesthesia Preprocedure Evaluation (Signed)
Anesthesia Evaluation  Patient identified by MRN, date of birth, ID band Patient awake    Reviewed: Allergy & Precautions, NPO status , Patient's Chart, lab work & pertinent test results  Airway Mallampati: I  TM Distance: >3 FB Neck ROM: Full    Dental  (+) Poor Dentition, Dental Advisory Given   Pulmonary    breath sounds clear to auscultation       Cardiovascular hypertension, Pt. on medications  Rhythm:Regular Rate:Normal     Neuro/Psych    GI/Hepatic hiatal hernia,   Endo/Other  diabetes, Well Controlled, Type 2, Insulin Dependent  Renal/GU      Musculoskeletal   Abdominal   Peds  Hematology   Anesthesia Other Findings   Reproductive/Obstetrics                             Anesthesia Physical Anesthesia Plan  ASA: III  Anesthesia Plan: General   Post-op Pain Management:    Induction: Intravenous  Airway Management Planned: LMA  Additional Equipment:   Intra-op Plan:   Post-operative Plan: Extubation in OR  Informed Consent: I have reviewed the patients History and Physical, chart, labs and discussed the procedure including the risks, benefits and alternatives for the proposed anesthesia with the patient or authorized representative who has indicated his/her understanding and acceptance.   Dental advisory given  Plan Discussed with: CRNA, Anesthesiologist and Surgeon  Anesthesia Plan Comments:         Anesthesia Quick Evaluation

## 2015-03-10 DIAGNOSIS — E1142 Type 2 diabetes mellitus with diabetic polyneuropathy: Secondary | ICD-10-CM

## 2015-03-10 LAB — GLUCOSE, CAPILLARY
GLUCOSE-CAPILLARY: 249 mg/dL — AB (ref 65–99)
GLUCOSE-CAPILLARY: 154 mg/dL — AB (ref 65–99)
Glucose-Capillary: 99 mg/dL (ref 65–99)
Glucose-Capillary: 116 mg/dL — ABNORMAL HIGH (ref 65–99)

## 2015-03-10 NOTE — Progress Notes (Signed)
[facility] PROGRESS NOTE  Charlsie B Dana [name] [id number] DOB: 04-21-1956 DOA: 03/07/2015 PCP: [name], PA-C   HPI/Subjective: Ms. Dana [name] is a 59 y/o female with PMH Type II DM with peripheral neuropathy/PVD, HLD and past transmetatarsal amputation of the right foot. She presented to the ED 10/27 with complaints of left toe ulcer and pain. Symptoms began three weeks ago after she got a pedicure, wound care had been following her and started her on PO antibiotics. The ulcer continued to worsen and wound clinic recommended she come to the ED for evaluation. Xray of the left great toe showed osteomyelitis, orthopedic surgery consulted with plan for left great toe amputation 10/28.   Assessment/Plan:  Abscess with osteomyelitis of L Great Toe  - Patient presented to the ED 10/26 with left toe pain and ulcer after a pedicure x 3 weeks ago - Seen by wound care outpatient, started on PO abx  -Status post left great toe ray amputation - Continue Vanc and Zosyn at least for today, patient might have very short oral antibiotic course. -Per orthopedics nonweightbearing, he is a patient will need a wheelchair.  Possible Osteomyelitis of R transmetatarsal amputation   - At time of presentation patient was also found to have a large ulcer on plantar aspect of the foot with palpable bone  - Ortho surgery consulted with xray showing soft tissue ulceration without findings of osteomyelitis  - Likely will need BKA on the right in the future, ortho to discuss further with the patient  - Continue Vanc and Zosyn   DM Type II   - Long-term history of uncontrolled Type II DM with peripheral neuropathy and PVD  - Hgb A1C 13.0 on 02/21/15 ( average CBG of 326) - Blood sugars stable, fasting BG 149 this AM  -Given the same home dose, blood sugar this morning is 99, questionable compliance at home.  Hyperlipidemia  - LDL 10/28 - 57 - Continue Lipitor  Bipolar Disorder - Lithium level  <0.06 - Continue Lithium     Code Status: Full Code Family Communication: No family present at bedside at this time  Disposition Plan: TBD, L great toe amputation scheduled today    Consultants:  Orthopedic Surgery  Procedures:  None   Antibiotics:  Vancomycin   Zosyn    Objective: Filed Vitals:   03/10/15 0912  BP: 128/60  Pulse:   Temp:   Resp:     Intake/Output Summary (Last 24 hours) at 03/10/15 1228 Last data filed at 03/10/15 [id number]  Gross per 24 hour  Intake 1127.5 ml  Output      0 ml  Net 1127.5 ml   Filed Weights   03/07/15 2300  Weight: 72.7 kg (160 lb 4.4 oz)    Exam:   General:  Pleasant female, sitting upright in bed, no acute distress  Cardiovascular: Regular rate and rhythm, 3/6 systolic murmur   Respiratory: Breath sounds clear and equal bilaterally. No rales, rhonchi or wheezes.  Abdomen: Bowel sounds present in all 4 quadrants. Abdomen is soft, non-tender.   Musculoskeletal: Left great toe ulcerated with significant cellulitis and purulent drainage, bone is visible at base of wound. Strong pedal pulse in the left foot. Right foot with past transmetatarsal amputation, ulceration present on lateral plantar surface, no drainage.    Data Reviewed: Basic Metabolic Panel:  Recent Labs Lab 03/07/15 1915 03/08/15 0505  NA 132* 133*  K 4.5 4.4  CL 98* 99*  CO2 27 29  GLUCOSE 199* 196*  BUN 28* 16  CREATININE 0.95 0.77  CALCIUM 9.1 8.8*   Liver Function Tests:  Recent Labs Lab 03/07/15 1915  AST 16  ALT 17  ALKPHOS 124  BILITOT 0.4  PROT 8.3*  ALBUMIN 2.9*   No results for input(s): LIPASE, AMYLASE in the last 168 hours. No results for input(s): AMMONIA in the last 168 hours. CBC:  Recent Labs Lab 03/07/15 1915 03/08/15 0505  WBC 11.1* 6.8  NEUTROABS 7.8*  --   HGB 8.6* 8.0*  HCT 27.0* 24.6*  MCV 82.1 81.7  PLT 565* 452*   CBG:  Recent Labs Lab 03/09/15 1602 03/09/15 1652 03/09/15 2139 03/10/15 0645  03/10/15 1123  GLUCAP 80 100* 116* 99 116*    Recent Results (from the past 240 hour(s))  Culture, blood (routine x 2)     Status: None (Preliminary result)   Collection Time: 03/07/15  7:20 PM  Result Value Ref Range Status   Specimen Description BLOOD LEFT ANTECUBITAL  Final   Special Requests BOTTLES DRAWN AEROBIC AND ANAEROBIC 5CC EACH  Final   Culture   Final    NO GROWTH 2 DAYS Performed at [facility]    Report Status PENDING  Incomplete  Culture, blood (routine x 2)     Status: None (Preliminary result)   Collection Time: 03/07/15  7:25 PM  Result Value Ref Range Status   Specimen Description BLOOD RIGHT ANTECUBITAL  Final   Special Requests BOTTLES DRAWN AEROBIC AND ANAEROBIC 5ML EACH  Final   Culture   Final    NO GROWTH 2 DAYS Performed at [facility]    Report Status PENDING  Incomplete  Surgical pcr screen     Status: None   Collection Time: 03/09/15  5:15 AM  Result Value Ref Range Status   MRSA, PCR NEGATIVE NEGATIVE Final   Staphylococcus aureus NEGATIVE NEGATIVE Final    Comment:        The Xpert SA Assay (FDA approved for NASAL specimens in patients over 32 years of age), is one component of a comprehensive surveillance program.  Test performance has been validated by [facility] for patients greater than or equal to 6 year old. It is not intended to diagnose infection nor to guide or monitor treatment.      Studies: No results found.  Scheduled Meds: . atorvastatin  20 mg Oral QHS  . escitalopram  7.5 mg Oral q morning - 10a  . gabapentin  600 mg Oral TID  . heparin  5,000 Units Subcutaneous 3 times per day  . insulin aspart  0-15 Units Subcutaneous TID WC  . insulin detemir  30 Units Subcutaneous QHS  . lithium carbonate  300 mg Oral Q supper  . lithium carbonate  600 mg Oral BID AC & HS  . piperacillin-tazobactam (ZOSYN)  IV  3.375 g Intravenous Q8H  . ramipril  2.5 mg Oral Daily  . sodium chloride  3 mL Intravenous  Q12H  . vancomycin  750 mg Intravenous Q12H   Continuous Infusions: . sodium chloride    . lactated ringers Stopped (03/09/15 1508)    Active Problems:   Diabetic polyneuropathy associated with type 2 diabetes mellitus (HCC)   Cellulitis and abscess of toe   Diabetic ulcer of toe ([place])    Time spent: 35 minutes     [facility] Pager [phone] If 7PM-7AM, please contact night-coverage at [email or web address], password [facility] 03/10/2015, 12:28 PM  LOS: 3 days

## 2015-03-10 NOTE — Progress Notes (Signed)
Patient refused taking lithium and her lithium blood level was 0.06 on 27th. Notified Dr. Hartford Poli and he is aware of it.

## 2015-03-10 NOTE — Evaluation (Signed)
Physical Therapy Evaluation Patient Details Name: Dana [name] MRN: [id number] DOB: 1955-09-20 Today's Date: 03/10/2015   History of Present Illness  Pt s/p L great toe amp. h/o right transmet amputation . Pt with PMH signiificant for DM with neuropathies.   Clinical Impression  Pt is s/p Left great toe amputation and is NWB LLE. Pt would like a knee walker as she states that works the best for her to maintain NWB, unable to maintain NWB with RW.  Will benefit from acute PT to maximize mobility prior to DC.  Will attempt knee walker next session.      Follow Up Recommendations No PT follow up;Supervision for mobility/OOB (Pt declining HHPT)    Equipment Recommendations  Other (comment) (Knee walker-pt reports this has worked well for her )    Recommendations for Other Services       Precautions / Restrictions Precautions Precautions: Fall Required Braces or Orthoses: Other Brace/Splint (Bilateral cast shoes) Restrictions Weight Bearing Restrictions: Yes LLE Weight Bearing: Non weight bearing      Mobility  Bed Mobility Overal bed mobility: Independent                Transfers Overall transfer level: Needs assistance Equipment used: Rolling walker (2 wheeled) Transfers: Sit to/from Stand Sit to Stand: Supervision         General transfer comment: VC for safest technique  Ambulation/Gait Ambulation/Gait assistance: Min guard Ambulation Distance (Feet): 20 Feet Assistive device: Rolling walker (2 wheeled) Gait Pattern/deviations: Step-to pattern     General Gait Details: Pt with difficulty maintaining  NWB status   Stairs            Wheelchair Mobility    Modified Rankin (Stroke Patients Only)       Balance                                             Pertinent Vitals/Pain Pain Assessment: 0-10 Pain Score: 6  Pain Location: L foot Pain Intervention(s): Monitored during session    Home Living Family/patient expects  to be discharged to:: Private residence Living Arrangements: Spouse/significant other Available Help at Discharge: Family;Available 24 hours/day Type of Home: House Home Access: Stairs to enter Entrance Stairs-Rails: Right Entrance Stairs-Number of Steps: 3 Home Layout: One level Home Equipment: Walker - 2 wheels;Tub bench;Wheelchair - manual Additional Comments: Would like to rent knee walker    Prior Function Level of Independence: Independent               Hand Dominance   Dominant Hand: Right    Extremity/Trunk Assessment   Upper Extremity Assessment: Overall WFL for tasks assessed           Lower Extremity Assessment: LLE deficits/detail   LLE Deficits / Details: L ankle limited due to wrap around foot and ankle   Cervical / Trunk Assessment: Normal  Communication   Communication: No difficulties  Cognition Arousal/Alertness: Awake/alert   Overall Cognitive Status: Within Functional Limits for tasks assessed                      General Comments General comments (skin integrity, edema, etc.): no family present. Pr RN Case manager pt does not want HHPT at DC.     Exercises        Assessment/Plan    PT Assessment Patient needs continued PT services  PT Diagnosis Difficulty walking   PT Problem List Decreased activity tolerance;Decreased mobility;Decreased knowledge of use of DME;Pain  PT Treatment Interventions DME instruction;Gait training;Stair training;Functional mobility training;Therapeutic activities;Therapeutic exercise;Patient/family education   PT Goals (Current goals can be found in the Care Plan section) Acute Rehab PT Goals Patient Stated Goal: to be able to walk to the bathroom by herself PT Goal Formulation: With patient Time For Goal Achievement: 03/17/15 Potential to Achieve Goals: Good    Frequency Min 3X/week   Barriers to discharge        Co-evaluation               End of Session Equipment Utilized During  Treatment: Gait belt;Other (comment) (Bilateral cast shoes) Activity Tolerance: Patient tolerated treatment well;Other (comment) (unable to maintain NWB LLE) Patient left: in chair;with call bell/phone within reach           Time: [id number] PT Time Calculation (min) (ACUTE ONLY): 19 min   Charges:   PT Evaluation $Initial PT Evaluation Tier I: 1 Procedure     PT G CodesMelvern Banker 03/10/2015, 11:12 AM  [name], PT  [phone] 03/10/2015

## 2015-03-10 NOTE — Progress Notes (Signed)
Patient is stable with dry dressing. NWB operative extremity PT for home safety eval F/u Dr. Sharol Given 2 weeks.  Dana Cecil, MD [place] 9:04 AM

## 2015-03-10 NOTE — Plan of Care (Signed)
Problem: Acute Rehab PT Goals(only PT should resolve) Goal: Pt Will Ambulate Knee walker

## 2015-03-10 NOTE — Care Management Note (Signed)
Case Management Note  Patient Details  Name: Dana [name] MRN: [id number] Date of Birth: [birth year]  Subjective/Objective:    59 yr old female admitted with osteo of left great toe and abscess of right foot amputation. Patient underwent Left great toe amputation.                Action/Plan:  Case manager spoke with patient concerning need for home health physical therapy. Patient politely declined. She also declines wheelchair. She has used knee walker previously and states that is better for her. Case manager informed her that she will have to pick that up at the [facility], patient is in agreement with that. Case manager will provide her with a copy of the order for same. Patient states her husband , who utilizies a wheelchair will be assisting her at discharge.   Expected Discharge Date:   03/11/15             Expected Discharge Plan:   Home self care   In-House Referral:  NA  Discharge planning Services  CM Consult  Post Acute Care Choice:  Durable Medical Equipment Choice offered to:  Patient  DME Arranged:  Other see comment DME Agency:  Other - Comment  HH Arranged:  Patient Refused [place] Agency:     Status of Service:  In process, will continue to follow  Medicare Important Message Given:    Date Medicare IM Given:    Medicare IM give by:    Date Additional Medicare IM Given:    Additional Medicare Important Message give by:     If discussed at [place] of Stay Meetings, dates discussed:    Additional Comments:  Ninfa Meeker, RN 03/10/2015, 11:13 AM

## 2015-03-10 NOTE — Clinical Social Work Note (Signed)
CSW received referral for SNF.  PT is recommending not PT follow up for patient.  CSW to sign off please re-consult if social work needs arise.  Jones Broom. [place], MSW, [place]

## 2015-03-11 DIAGNOSIS — D649 Anemia, unspecified: Secondary | ICD-10-CM | POA: Diagnosis present

## 2015-03-11 LAB — BASIC METABOLIC PANEL
ANION GAP: 9 (ref 5–15)
BUN: 8 mg/dL (ref 6–20)
CHLORIDE: 102 mmol/L (ref 101–111)
CO2: 25 mmol/L (ref 22–32)
Calcium: 8.6 mg/dL — ABNORMAL LOW (ref 8.9–10.3)
Creatinine, Ser: 0.65 mg/dL (ref 0.44–1.00)
GFR calc non Af Amer: >60 mL/min (ref >60)
Glucose, Bld: 177 mg/dL — ABNORMAL HIGH (ref 65–99)
Potassium: 3.8 mmol/L (ref 3.5–5.1)
SODIUM: 136 mmol/L (ref 135–145)

## 2015-03-11 LAB — GLUCOSE, CAPILLARY
GLUCOSE-CAPILLARY: 185 mg/dL — AB (ref 65–99)
GLUCOSE-CAPILLARY: 249 mg/dL — AB (ref 65–99)
GLUCOSE-CAPILLARY: 240 mg/dL — AB (ref 65–99)
Glucose-Capillary: 227 mg/dL — ABNORMAL HIGH (ref 65–99)

## 2015-03-11 NOTE — Progress Notes (Signed)
Physical Therapy Treatment Patient Details Name: [name] [name] MRN: [id number] DOB: 05/16/1955 Today's Date: 03/11/2015    History of Present Illness Pt s/p L great toe amp. h/o right transmet amputation . Pt with PMH signiificant for DM with neuropathies.     PT Comments    Pt progressing with mobility and she is hopeful for DC home soon. Will need a knee walker at home in order to maintain NWB status.     Follow Up Recommendations  No PT follow up;Supervision for mobility/OOB (Pt declining HHPT)     Equipment Recommendations  Other (comment) (Knee walker)    Recommendations for Other Services       Precautions / Restrictions Precautions Precautions: Fall Required Braces or Orthoses: Other Brace/Splint (Bilateral cast shoes) Restrictions Weight Bearing Restrictions: Yes LLE Weight Bearing: Non weight bearing    Mobility  Bed Mobility Overal bed mobility: Independent                Transfers Overall transfer level: Needs assistance   Transfers: Sit to/from Stand Sit to Stand: Supervision         General transfer comment: No cues needed today  Ambulation/Gait Ambulation/Gait assistance: Min guard Ambulation Distance (Feet): 25 Feet Assistive device:  (knee walker) Gait Pattern/deviations:  (small hops on r foot) Gait velocity: slower than age adjusted norms   General Gait Details: Pt able to maintain NWB without difficulty using knee walker. She greatly prefers the knee walker to the RW and plans to rent one at DC.  Pt did not want to walk out in the hallway today and preferred to just ambulate in the room.     Stairs Stairs:  (Pt declined practicing stairs today. )          Wheelchair Mobility    Modified Rankin (Stroke Patients Only)       Balance                                    Cognition Arousal/Alertness: Awake/alert Behavior During Therapy: WFL for tasks assessed/performed Overall Cognitive Status: Within  Functional Limits for tasks assessed                      Exercises      General Comments General comments (skin integrity, edema, etc.): No family present. Pt will get knee walker for home use.        Pertinent Vitals/Pain Pain Assessment: 0-10 Pain Score: 0-No pain Pain Intervention(s): Monitored during session                                      PT Goals (current goals can now be found in the care plan section) Acute Rehab PT Goals Patient Stated Goal: to be able to walk to the bathroom by herself Progress towards PT goals: Progressing toward goals    Frequency  Min 3X/week    PT Plan Current plan remains appropriate                 End of Session Equipment Utilized During Treatment: Gait belt;Other (comment) (Bilateral cast shoes) Activity Tolerance: Patient tolerated treatment well Patient left: with call bell/phone within reach;in bed     Time: 0901-0921 PT Time Calculation (min) (ACUTE ONLY): 20 min  Charges:  $Gait Training: 8-22 mins  G CodesMelvern [name] Mar 23, 2015, 12:25 PM [name] [name], [name] [name] 2015-03-23

## 2015-03-11 NOTE — Progress Notes (Signed)
[facility] PROGRESS NOTE  Dana [name] [id number] DOB: 06-20-1955 DOA: 03/07/2015 PCP: Dana Rio, PA-C   HPI/Subjective: [name] is a 59 y/o female with PMH Type II DM with peripheral neuropathy/PVD, HLD and past transmetatarsal amputation of the right foot. She presented to the ED 10/27 with complaints of left toe ulcer and pain. Symptoms began three weeks ago after she got a pedicure, wound care had been following her and started her on PO antibiotics. The ulcer continued to worsen and wound clinic recommended she come to the ED for evaluation. Xray of the left great toe showed osteomyelitis, orthopedic surgery consulted with plan for left great toe amputation 10/28.   Assessment/Plan:  Abscess with osteomyelitis of L Great Toe  - Patient presented to the ED 10/26 with left toe pain and ulcer after a pedicure x 3 weeks ago - Seen by wound care outpatient, started on PO abx  -Status post left great toe ray amputation -Per orthopedics nonweightbearing, PT evaluated the patient, she will need a knee scooter. (His insurance does not cover, patient voice understanding, she noticed he has to get it out of her pocket). -Continue IV antibiotics for today, she might not need IV antibiotics in a.m. -Plan for discharge in a.m. likely she will not need any antibiotics on discharge.  Status post R transmetatarsal amputation   - At time of presentation patient was also found to have a large ulcer on plantar aspect of the foot with palpable bone  - Ortho surgery consulted with xray showing soft tissue ulceration without findings of osteomyelitis  - Likely will need BKA on the right in the future, ortho to discuss further with the patient  - Ulcer is clear without any pus or purulent discharge, no evidence of infection.  DM Type II   - Long-term history of uncontrolled Type II DM with peripheral neuropathy and PVD  - Hgb A1C 13.0 on 02/21/15 ( average CBG of 326) - Blood  sugars stable, fasting BG 149 this AM  -Given the same home dose, blood sugar this morning is 99. -Patient refused Levemir insulin last night, as his concerns about compliance at home, blood sugar this morning is 177.  Hyperlipidemia  - LDL 10/28 - 57 - Continue Lipitor  Bipolar Disorder - Lithium level <0.06 - Continue Lithium    Pseudohyponatremia -Pseudohyponatremia secondary to hyperglycemia, corrected sodium to the elevated glucose is normal.   Code Status: Full Code Family Communication: No family present at bedside at this time  Disposition Plan: TBD, L great toe amputation scheduled today    Consultants:  Orthopedic Surgery  Procedures:  None   Antibiotics:  Vancomycin   Zosyn    Objective: Filed Vitals:   03/11/15 0620  BP: 120/88  Pulse: 89  Temp: 99 F (37.2 C)  Resp: 18    Intake/Output Summary (Last 24 hours) at 03/11/15 1254 Last data filed at 03/11/15 0620  Gross per 24 hour  Intake    480 ml  Output      4 ml  Net    476 ml   Filed Weights   03/07/15 2300  Weight: 72.7 kg (160 lb 4.4 oz)    Exam:   General:  Pleasant female, sitting upright in bed, no acute distress  Cardiovascular: Regular rate and rhythm, 3/6 systolic murmur   Respiratory: Breath sounds clear and equal bilaterally. No rales, rhonchi or wheezes.  Abdomen: Bowel sounds present in all 4 quadrants. Abdomen is soft, non-tender.  Musculoskeletal: Left great toe ulcerated with significant cellulitis and purulent drainage, bone is visible at base of wound. Strong pedal pulse in the left foot. Right foot with past transmetatarsal amputation, ulceration present on lateral plantar surface, no drainage.    Data Reviewed: Basic Metabolic Panel:  Recent Labs Lab 03/07/15 1915 03/08/15 0505 03/11/15 0733  NA 132* 133* 136  K 4.5 4.4 3.8  CL 98* 99* 102  CO2 27 29 25   GLUCOSE 199* 196* 177*  BUN 28* 16 8  CREATININE 0.95 0.77 0.65  CALCIUM 9.1 8.8* 8.6*    Liver Function Tests:  Recent Labs Lab 03/07/15 1915  AST 16  ALT 17  ALKPHOS 124  BILITOT 0.4  PROT 8.3*  ALBUMIN 2.9*   No results for input(s): LIPASE, AMYLASE in the last 168 hours. No results for input(s): AMMONIA in the last 168 hours. CBC:  Recent Labs Lab 03/07/15 1915 03/08/15 0505  WBC 11.1* 6.8  NEUTROABS 7.8*  --   HGB 8.6* 8.0*  HCT 27.0* 24.6*  MCV 82.1 81.7  PLT 565* 452*   CBG:  Recent Labs Lab 03/10/15 1123 03/10/15 1634 03/10/15 2115 03/11/15 0608 03/11/15 1132  GLUCAP 116* 249* 154* 185* 249*    Recent Results (from the past 240 hour(s))  Culture, blood (routine x 2)     Status: None (Preliminary result)   Collection Time: 03/07/15  7:20 PM  Result Value Ref Range Status   Specimen Description BLOOD LEFT ANTECUBITAL  Final   Special Requests BOTTLES DRAWN AEROBIC AND ANAEROBIC 5CC EACH  Final   Culture   Final    NO GROWTH 4 DAYS Performed at [facility]    Report Status PENDING  Incomplete  Culture, blood (routine x 2)     Status: None (Preliminary result)   Collection Time: 03/07/15  7:25 PM  Result Value Ref Range Status   Specimen Description BLOOD RIGHT ANTECUBITAL  Final   Special Requests BOTTLES DRAWN AEROBIC AND ANAEROBIC 5ML EACH  Final   Culture   Final    NO GROWTH 4 DAYS Performed at [facility]    Report Status PENDING  Incomplete  Surgical pcr screen     Status: None   Collection Time: 03/09/15  5:15 AM  Result Value Ref Range Status   MRSA, PCR NEGATIVE NEGATIVE Final   Staphylococcus aureus NEGATIVE NEGATIVE Final    Comment:        The Xpert SA Assay (FDA approved for NASAL specimens in patients over 37 years of age), is one component of a comprehensive surveillance program.  Test performance has been validated by [facility] for patients greater than or equal to 58 year old. It is not intended to diagnose infection nor to guide or monitor treatment.      Studies: No results  found.  Scheduled Meds: . atorvastatin  20 mg Oral QHS  . escitalopram  7.5 mg Oral q morning - 10a  . gabapentin  600 mg Oral TID  . heparin  5,000 Units Subcutaneous 3 times per day  . insulin aspart  0-15 Units Subcutaneous TID WC  . insulin detemir  30 Units Subcutaneous QHS  . lithium carbonate  300 mg Oral Q supper  . lithium carbonate  600 mg Oral BID AC & HS  . piperacillin-tazobactam (ZOSYN)  IV  3.375 g Intravenous Q8H  . ramipril  2.5 mg Oral Daily  . vancomycin  750 mg Intravenous Q12H   Continuous Infusions:  Active Problems:   Diabetic polyneuropathy associated with type 2 diabetes mellitus (HCC)   Cellulitis and abscess of toe   Diabetic ulcer of toe ([place])    Time spent: 35 minutes     [facility] Pager [phone] If 7PM-7AM, please contact night-coverage at [email or web address], password [email or web address] 03/11/2015, 12:54 PM  LOS: 4 days

## 2015-03-12 ENCOUNTER — Encounter (HOSPITAL_COMMUNITY): Payer: Self-pay | Admitting: Orthopedic Surgery

## 2015-03-12 DIAGNOSIS — D638 Anemia in other chronic diseases classified elsewhere: Secondary | ICD-10-CM

## 2015-03-12 LAB — CULTURE, BLOOD (ROUTINE X 2)
CULTURE: NO GROWTH
CULTURE: NO GROWTH

## 2015-03-12 LAB — GLUCOSE, CAPILLARY
GLUCOSE-CAPILLARY: 154 mg/dL — AB (ref 65–99)
GLUCOSE-CAPILLARY: 190 mg/dL — AB (ref 65–99)

## 2015-03-12 NOTE — Progress Notes (Signed)
Patient ID: Dana [name], female   DOB: [birth year], 59 y.o.   MRN: [id number] Patient is status post left great toe amputation. She is progressing well anticipate discharge to home today I will follow-up in the office in 1-2 weeks.

## 2015-03-12 NOTE — Discharge Summary (Signed)
Physician Discharge Summary  Dana [name] [id number] DOB: [birth year] DOA: 03/07/2015  PCP: Dana Rio, PA-C  Admit date: 03/07/2015 Discharge date: 03/12/2015  Time spent: 40 minutes  Recommendations for Outpatient Follow-up:  1. Follow-up with Dr. Sharol [name] in 1-2 weeks as outpatient.  Discharge Diagnoses:  Active Problems:   DM (diabetes mellitus) type II uncontrolled with eye manifestation ([place])   Diabetic polyneuropathy associated with type 2 diabetes mellitus ([place])   Cellulitis and abscess of toe   Diabetic ulcer of toe ([place])   Anemia   Discharge Condition: Stable  Diet recommendation: Heart healthy/carbohydrate modified diet  Filed Weights   03/07/15 2300  Weight: 72.7 kg (160 lb 4.4 oz)    History of present illness:  Dana [name] is [name] 59 y.o. female, with past medical history significant for diabetes mellitus, presenting with left toe ulcer and pain after she had [name] pedicure. Symptoms started [name] few weeks ago no fever or chills and she has been receiving treatment at the local wound care clinic and was started 3 days on by mouth antibiotics. The ulcer has gotten much worse and she was seen in [facility] who discussed the case with orthopedics Dr. Sharol [name] group who advised admission for IV antibiotics and surgical intervention. The patient had had [name] right transmetatarsal amputation of right foot in the past and the wound is clean.  Hospital Course:   Osteomyelitis of the tip of the left great toe  - Patient presented to the ED 10/26 with left toe pain and ulcer after [name] pedicure x 3 weeks ago -Presented with exposed bone of the first phalanx, loss of soft tissues around it. There is purulent discharge as well. -Status post left great toe ray amputation -Per orthopedics nonweightbearing, PT evaluated the patient, she will need [name] knee scooter.  -Patient received IV antibiotics for total 5 days, no knee joint antibiotics on discharge. -Patient to  follow-up with orthopedics as outpatient.  Status post R transmetatarsal amputation  - At time of presentation patient was also found to have [name] large ulcer on plantar aspect of the foot with palpable bone  - Ortho surgery consulted with xray showing soft tissue ulceration without findings of osteomyelitis  - Ulcer is clear without any pus or purulent discharge, no evidence of infection. - Patient follow-up with orthopedics for previous right transmetatarsal amputation.  DM Type II  - Long-term history of uncontrolled Type II DM with peripheral neuropathy and PVD  - Hgb A1C 13.0 on 02/21/15 ( average CBG of 326) - Recommended for her to continue use 30 units of Levemir insulin and her metformin.  Hyperlipidemia  - LDL 10/28 - 57 - Continue Lipitor  Bipolar Disorder - Lithium level <0.06 - Continue Lithium   Pseudohyponatremia -Pseudohyponatremia secondary to hyperglycemia, corrected sodium to the elevated glucose is normal.   Procedures:  None  Consultations:  Dr,. Duda  Discharge Exam: Filed Vitals:   03/12/15 0545  BP: 115/68  Pulse: 81  Temp: 98.4 F (36.9 C)  Resp: 18   General: Alert and awake, oriented x3, not in any acute distress. HEENT: anicteric sclera, pupils reactive to light and accommodation, EOMI CVS: S1-S2 clear, no murmur rubs or gallops Chest: clear to auscultation bilaterally, no wheezing, rales or rhonchi Abdomen: soft nontender, nondistended, normal bowel sounds, no organomegaly Extremities: no cyanosis, clubbing or edema noted bilaterally Neuro: Cranial nerves II-XII intact, no focal neurological deficits  Discharge Instructions   Discharge Instructions    Diet - low  sodium heart healthy    Complete by:  As directed      Increase activity slowly    Complete by:  As directed      Non weight bearing    Complete by:  As directed   Laterality:  left  Extremity:  Lower     Post-op shoe    Complete by:  As directed            Current Discharge Medication List    CONTINUE these medications which have NOT CHANGED   Details  aspirin 81 MG tablet Take 81 mg by mouth daily.    atorvastatin (LIPITOR) 20 MG tablet Take 1 tablet (20 mg total) by mouth at bedtime. Qty: 30 tablet, Refills: 5    escitalopram (LEXAPRO) 5 MG tablet Take 7.5 mg by mouth every morning.     gabapentin (NEURONTIN) 300 MG capsule Take 600 mg each at breakfast and lunch. Take 900 mg at night. Qty: 210 capsule, Refills: 5   Associated Diagnoses: Uncontrolled type 2 diabetes mellitus with retinopathy, with long-term current use of insulin, macular edema presence unspecified, unspecified retinopathy severity (HCC)    Insulin Detemir (LEVEMIR FLEXTOUCH) 100 UNIT/ML Pen Increase by 2 units every 3-4 days until Am glucose 80-130 Qty: 15 mL, Refills: 5   Associated Diagnoses: DM (diabetes mellitus) type II uncontrolled with eye manifestation (HCC)    metFORMIN (GLUCOPHAGE) 500 MG tablet Take 1000 mg each morning and 500 mg each evening Qty: 90 tablet, Refills: 5   Associated Diagnoses: Uncontrolled type 2 diabetes mellitus with retinopathy, with long-term current use of insulin, macular edema presence unspecified, unspecified retinopathy severity (HCC)    ramipril (ALTACE) 2.5 MG capsule Take 1 capsule (2.5 mg total) by mouth daily. Qty: 30 capsule, Refills: 3   Associated Diagnoses: Uncontrolled type 2 diabetes mellitus with retinopathy, with long-term current use of insulin, macular edema presence unspecified, unspecified retinopathy severity (HCC)      STOP taking these medications     ciprofloxacin (CIPRO) 500 MG tablet      lithium carbonate 300 MG capsule        Allergies  Allergen Reactions  . Ativan [Lorazepam] Other (See Comments)    Abnormal behavior  . Darvocet [Propoxyphene N-Acetaminophen] Itching  . Demerol [Meperidine] Other (See Comments)    Abnormal behavior  . Latex Itching  . Lithium Nausea And Vomiting    Can  not keep this medication down. It makes her terribly ill.  . Oxycodone Other (See Comments)    Abnormal behavior   Follow-up Information    Follow up with DUDA,Dana V, MD In 2 weeks.   Specialty:  Orthopedic Surgery   Contact information:   [place] [address] [phone]        The results of significant diagnostics from this hospitalization (including imaging, microbiology, ancillary and laboratory) are listed below for reference.    Significant Diagnostic Studies: Dg Foot 2 Views Left  03/08/2015  CLINICAL DATA:  Left great toe infection EXAM: LEFT FOOT - 2 VIEW COMPARISON:  MRI of the left forefoot dated March 05, 2015 FINDINGS: There is loss of the soft tissues over the distal third of the distal phalanx of the great toe. There is exposure of the tuft of the distal phalanx. There is loss of the sharp cortical margin of the lateral 1/2 of the anterior surface of the tuft of the distal phalanx. The IP joint is normal. The proximal phalanx is unremarkable. The second  through fifth phalanges exhibit no acute abnormalities. The MTP joints exhibit no significant degenerative change. There are changes consistent with Charcot joints of the intertarsal and tarsometatarsal joints. There are large plantar calcaneal spurs. There is soft tissue swelling over the dorsum of the midfoot. IMPRESSION: 1. Subtle changes of osteomyelitis associated with the anterior surface of the tuft of the great toe. There is loss of the soft tissues over the distal aspect of the toe which may be postsurgical or reflect known cellulitis. 2. Charcot changes of the tarsometatarsal and intertarsal joints. Electronically Signed   By: David  [name] M.D.   On: 03/08/2015 08:14   Dg Foot Complete Right  03/08/2015  CLINICAL DATA:  1st toe infection, right foot ulcer on bottom of foot EXAM: RIGHT FOOT COMPLETE - 3+ VIEW COMPARISON:  MRI foot dated 08/25/2014 FINDINGS: Status post forefoot amputation.  Soft tissue ulceration along the plantar aspect of the midfoot. Associated soft tissue swelling. No definite cortical irregularity along the overlying bone to suggest osteomyelitis. However, this is not considered [name] sensitive evaluation. IMPRESSION: Status post forefoot amputation. Soft tissue ulceration along the plantar aspect of the midfoot with associated soft tissue swelling. No definite radiographic findings of osteomyelitis, noting that this is not [name] sensitive evaluation. Electronically Signed   By: Julian Hy M.D.   On: 03/08/2015 08:16    Microbiology: Recent Results (from the past 240 hour(s))  Culture, blood (routine x 2)     Status: None   Collection Time: 03/07/15  7:20 PM  Result Value Ref Range Status   Specimen Description BLOOD LEFT ANTECUBITAL  Final   Special Requests BOTTLES DRAWN AEROBIC AND ANAEROBIC 5CC EACH  Final   Culture   Final    NO GROWTH 5 DAYS Performed at [facility]    Report Status 03/12/2015 FINAL  Final  Culture, blood (routine x 2)     Status: None   Collection Time: 03/07/15  7:25 PM  Result Value Ref Range Status   Specimen Description BLOOD RIGHT ANTECUBITAL  Final   Special Requests BOTTLES DRAWN AEROBIC AND ANAEROBIC 5ML EACH  Final   Culture   Final    NO GROWTH 5 DAYS Performed at [facility]    Report Status 03/12/2015 FINAL  Final  Surgical pcr screen     Status: None   Collection Time: 03/09/15  5:15 AM  Result Value Ref Range Status   MRSA, PCR NEGATIVE NEGATIVE Final   Staphylococcus aureus NEGATIVE NEGATIVE Final    Comment:        The Xpert SA Assay (FDA approved for NASAL specimens in patients over 23 years of age), is one component of [name] comprehensive surveillance program.  Test performance has been validated by [facility] for patients greater than or equal to 62 year old. It is not intended to diagnose infection nor to guide or monitor treatment.      Labs: Basic Metabolic Panel:  Recent  Labs Lab 03/07/15 1915 03/08/15 0505 03/11/15 0733  NA 132* 133* 136  K 4.5 4.4 3.8  CL 98* 99* 102  CO2 27 29 25   GLUCOSE 199* 196* 177*  BUN 28* 16 8  CREATININE 0.95 0.77 0.65  CALCIUM 9.1 8.8* 8.6*   Liver Function Tests:  Recent Labs Lab 03/07/15 1915  AST 16  ALT 17  ALKPHOS 124  BILITOT 0.4  PROT 8.3*  ALBUMIN 2.9*   No results for input(s): LIPASE, AMYLASE in the last 168 hours. No results  for input(s): AMMONIA in the last 168 hours. CBC:  Recent Labs Lab 03/07/15 1915 03/08/15 0505  WBC 11.1* 6.8  NEUTROABS 7.8*  --   HGB 8.6* 8.0*  HCT 27.0* 24.6*  MCV 82.1 81.7  PLT 565* 452*   Cardiac Enzymes: No results for input(s): CKTOTAL, CKMB, CKMBINDEX, TROPONINI in the last 168 hours. BNP: BNP (last 3 results) No results for input(s): BNP in the last 8760 hours.  ProBNP (last 3 results) No results for input(s): PROBNP in the last 8760 hours.  CBG:  Recent Labs Lab 03/11/15 0608 03/11/15 1132 03/11/15 1634 03/11/15 2130 03/12/15 0633  GLUCAP 185* 249* 227* 240* 154*       Signed:  Sakara Lehtinen [name]  [facility] 03/12/2015, 11:06 AM

## 2015-03-12 NOTE — Progress Notes (Signed)
Physical Therapy Treatment Patient Details Name: Dana [name] [place] MRN: [id number] DOB: [birth year] Today's Date: 03/12/2015    History of Present Illness Pt s/p L great toe amp. h/o right transmet amputation . Pt with PMH signiificant for DM with neuropathies.     PT Comments    Pt able to don clothes on her own but with any transition to standing to stairs pt has to utilize heel weightbearing on LLE due to balance and right transmet amputation. Pt educated for stairs and performed well with heel weightbearing and educated for need to limit trips out to prevent weightbearing. Pt moving well with knee walker and states she will pick one up on her way home. Pt denied further activity as discharging today.   Follow Up Recommendations  No PT follow up;Supervision for mobility/OOB     Equipment Recommendations  Other (comment) (knee walker)    Recommendations for Other Services       Precautions / Restrictions Precautions Precautions: Fall Required Braces or Orthoses: Other Brace/Splint Other Brace/Splint: bil post op shoes Restrictions LLE Weight Bearing: Non weight bearing    Mobility  Bed Mobility Overal bed mobility: Independent                Transfers Overall transfer level: Modified independent   Transfers: Stand Pivot Transfers           General transfer comment: pt able to stand to knee walker and pivot to toilet with minor heel weightbearing on LLE  Ambulation/Gait Ambulation/Gait assistance: Supervision Ambulation Distance (Feet): 100 Feet Assistive device:  (knee walker) Gait Pattern/deviations: Step-to pattern   Gait velocity interpretation: Below normal speed for age/gender General Gait Details: Pt maintaining NWB with knee walker and very safe with walking and directing   Stairs Stairs: Yes Stairs assistance: Min assist Stair Management: Backwards;With walker Number of Stairs: 2 General stair comments: pt unable to hop with maintaining NWB on  LLE. pt used heel weightbearing on LLE to ascend stairs with RW with cues for sequence and safety. pt has no rails and home and utilizing heel weight bearing for limited distance on stairs is pt's safest option for entering home  Wheelchair Mobility    Modified Rankin (Stroke Patients Only)       Balance Overall balance assessment: Needs assistance   Sitting balance-Leahy Scale: Good       Standing balance-Leahy Scale: Fair                      Cognition Arousal/Alertness: Awake/alert Behavior During Therapy: WFL for tasks assessed/performed Overall Cognitive Status: Within Functional Limits for tasks assessed                      Exercises      General Comments        Pertinent Vitals/Pain Pain Assessment: No/denies pain    Home Living                      Prior Function            PT Goals (current goals can now be found in the care plan section) Progress towards PT goals: Progressing toward goals    Frequency       PT Plan Current plan remains appropriate    Co-evaluation             End of Session   Activity Tolerance: Patient tolerated treatment well Patient left: in chair (Pt in  WC at front desk for discharge with nursing)     Time: [id number] PT Time Calculation (min) (ACUTE ONLY): 15 min  Charges:  $Gait Training: 8-22 mins                    G Codes:      Melford Aase 03/24/2015, 1:54 PM Elwyn Reach, [place]

## 2015-03-12 NOTE — Care Management Important Message (Signed)
Important Message  Patient Details  Name: Dana [name] MRN: [id number] Date of Birth: 05/18/1955   Medicare Important Message Given:  Yes-second notification given    [name] May 03/12/2015, 5:41 PM

## 2015-03-13 NOTE — Anesthesia Postprocedure Evaluation (Signed)
  Anesthesia Post-op Note  Patient: Migdalia B [place]  Procedure(s) Performed: Procedure(s): LEFT FOOT 1ST RAY AMPUTATION (Left)  Patient Location: PACU  Anesthesia Type:General  Level of Consciousness: awake and alert   Airway and Oxygen Therapy: Patient Spontanous Breathing  Post-op Pain: mild  Post-op Assessment: Post-op Vital signs reviewed LLE Motor Response: Responds to commands, Purposeful movement LLE Sensation: Decreased (Pt. stated she felt slight sensation in toes )   RLE Sensation: Decreased, Other (Comment) (Diabetic neuropathy)      Post-op Vital Signs: Reviewed  Last Vitals:  Filed Vitals:   03/12/15 0545  BP: 115/68  Pulse: 81  Temp: 36.9 C  Resp: 18    Complications: No apparent anesthesia complications

## 2015-03-15 ENCOUNTER — Other Ambulatory Visit: Payer: Self-pay | Admitting: *Deleted

## 2015-03-15 ENCOUNTER — Ambulatory Visit (INDEPENDENT_AMBULATORY_CARE_PROVIDER_SITE_OTHER): Payer: PPO | Admitting: Endocrinology

## 2015-03-15 ENCOUNTER — Telehealth: Payer: Self-pay

## 2015-03-15 ENCOUNTER — Encounter: Payer: Self-pay | Admitting: Endocrinology

## 2015-03-15 VITALS — BP 112/64 | HR 81 | Temp 98.0°F | Resp 14 | Ht 61.0 in | Wt 158.6 lb

## 2015-03-15 DIAGNOSIS — E1165 Type 2 diabetes mellitus with hyperglycemia: Secondary | ICD-10-CM | POA: Diagnosis not present

## 2015-03-15 DIAGNOSIS — E08319 Diabetes mellitus due to underlying condition with unspecified diabetic retinopathy without macular edema: Secondary | ICD-10-CM | POA: Diagnosis not present

## 2015-03-15 DIAGNOSIS — Z794 Long term (current) use of insulin: Secondary | ICD-10-CM

## 2015-03-15 DIAGNOSIS — Z89412 Acquired absence of left great toe: Secondary | ICD-10-CM | POA: Diagnosis not present

## 2015-03-15 MED ORDER — BAYER MICROLET LANCETS MISC: Status: DC

## 2015-03-15 MED ORDER — GLUCOSE BLOOD VI STRP: ORAL_STRIP | Status: DC

## 2015-03-15 MED ORDER — INSULIN REGULAR HUMAN 100 UNIT/ML IJ SOLN: INTRAMUSCULAR | Status: DC

## 2015-03-15 NOTE — Progress Notes (Addendum)
Patient ID: Dana [name], female   DOB: Dec 16, 1955, 59 y.o.   MRN: [id number]           Reason for Appointment: Consultation for Type 2 Diabetes  Referring physician: Elyn Aquas  History of Present Illness:          Date of diagnosis of type 2 diabetes mellitus: 2006       Background history:   She thinks her blood sugar was 300-400 at the time of diagnosis and she was started on insulin soon after this. She thinks she has been on metformin only for the last 5 years Her A1c history is available since only about 2013 and this had been consistently over 10%  Recent history:    She has been referred here for poor control of her diabetes and recent A1c of 13%  She had previously been on Lantus and this was changed this year to Levemir, not clear why She said that she cannot afford brand name insulins and she is currently using samples only She has had her insulin increased gradually but her sugars are still poorly controlled She does not think she has been on Novolog although it has been prescribed for short periods at times   INSULIN regimen is: 34 units of Levemir hs       Current blood sugar patterns and problems identified:    blood sugars not reviewed from monitor or home diary, only from patient recall   her blood sugars are variable in the morning and sometimes high, was 198 today  She says her blood sugars tend to be progressively high as the day goes on  She has not had any proper diabetes education or meal planning advice.  She continues to drink regular soft drinks although she thinks that even though she is having 3 cans a day or so this is less than what she used to have  Non-insulin hypoglycemic drugs the patient is taking are: Metformin since 5 yrs      Side effects from medications have been: None  Compliance with the medical regimen: Fair  Hypoglycemia: none   Glucose monitoring:  done 2-3 times a day         Glucometer: Relion.      Blood Glucose  readings by time of day by recall   PREMEAL Breakfast Lunch Dinner Bedtime  Overall   Glucose range: 100-200  220-240 240-260   Median:        POST-MEAL PC Breakfast PC Lunch PC Dinner  Glucose range: 200-220    Median:      Self-care: The diet that the patient has been following is: none.     Typical meal intake: Breakfast is irregular otherwise may have toast and an egg or sausage.  Lunch is a sandwich or cheeseburger, evening meal is meat and 2 vegetables.  For snacks he will have peanut butter crackers or small candy bars.      She has regular drinks, about 1 can 3x daily          Dietician visit, most recent: Never               Exercise: none for several months  Weight history: Stable 3-4 years, was 400 pounds about 5 years ago  Wt Readings from Last 3 Encounters:  03/15/15 158 lb 9.6 oz (71.94 kg)  03/07/15 160 lb 4.4 oz (72.7 kg)  03/07/15 160 lb 4 oz (72.689 kg)    Glycemic control:   Lab  Results  Component Value Date   HGBA1C 13.0* 02/21/2015   HGBA1C 10.1* 11/08/2014   HGBA1C 11.8* 10/24/2011   Lab Results  Component Value Date   MICROALBUR 4.0* 11/08/2014   LDLCALC 57 03/09/2015   CREATININE 0.65 03/11/2015         Medication List       This list is accurate as of: 03/15/15  8:45 PM.  Always use your most recent med list.               aspirin 81 MG tablet  Take 81 mg by mouth daily.     atorvastatin 20 MG tablet  Commonly known as:  LIPITOR  Take 1 tablet (20 mg total) by mouth at bedtime.     BAYER MICROLET LANCETS lancets  Use as instructed to check blood sugar 3 times per day     escitalopram 5 MG tablet  Commonly known as:  LEXAPRO  Take 7.5 mg by mouth every morning.     gabapentin 300 MG capsule  Commonly known as:  NEURONTIN  Take 600 mg each at breakfast and lunch. Take 900 mg at night.     glucose blood test strip  Commonly known as:  BAYER CONTOUR NEXT TEST  Use as instructed to check blood sugar 3 times per day dx code  E11.39     Insulin Detemir 100 UNIT/ML Pen  Commonly known as:  LEVEMIR FLEXTOUCH  Increase by 2 units every 3-4 days until Am glucose 80-130     insulin regular 100 units/mL injection  Commonly known as:  NOVOLIN R RELION  Inject 6-10 units three times a day with meals.     metFORMIN 500 MG tablet  Commonly known as:  GLUCOPHAGE  Take 1000 mg each morning and 500 mg each evening     ramipril 2.5 MG capsule  Commonly known as:  ALTACE  Take 1 capsule (2.5 mg total) by mouth daily.        Allergies:  Allergies  Allergen Reactions  . Ativan [Lorazepam] Other (See Comments)    Abnormal behavior  . Darvocet [Propoxyphene N-Acetaminophen] Itching  . Demerol [Meperidine] Other (See Comments)    Abnormal behavior  . Latex Itching  . Lithium Nausea And Vomiting    Can not keep this medication down. It makes her terribly ill.  . Oxycodone Other (See Comments)    Abnormal behavior    Past Medical History  Diagnosis Date  . Bipolar 1 disorder ([place])   . Hypertension     past hx of  . H/O hiatal hernia   . Numbness and tingling     Hx; of in B/LLE and B/LUE  . Hyperlipidemia   . Diabetes mellitus     INSULIN DEPENDENT  . Diabetic neuropathy ([place])   . Diabetic neuropathy ([place])   . Cellulitis and abscess of foot 03/08/2015    Past Surgical History  Procedure Laterality Date  . Breast surgery    . Abdominal hysterectomy    . Nasal septum surgery    . Amputation  10/24/2011    Procedure: AMPUTATION RAY;  Surgeon: Newt Minion, MD;  Location: [place];  Service: Orthopedics;  Laterality: Right;  Right Foot 3rd Ray Amputation  . Tonsillectomy    . Adenoidectomy      Hx: of  . Amputation Right 12/16/2012    Procedure: Right Foot Transmetatarsal Amputation;  Surgeon: Newt Minion, MD;  Location: [facility];  Service: Orthopedics;  Laterality: Right;  .  Bladder tact   2002  . Colon surgery    . Amputation Left 03/09/2015    Procedure: LEFT FOOT 1ST RAY AMPUTATION;  Surgeon: Newt Minion, MD;  Location: [facility];  Service: Orthopedics;  Laterality: Left;    Family History  Problem Relation Age of Onset  . Diabetes Mother   . Mental illness Mother   . Hypertension Father   . Diabetes Maternal Grandmother   . Heart disease Neg Hx     Social History:  reports that she has never smoked. She has never used smokeless tobacco. She reports that she does not drink alcohol or use illicit drugs.    Review of Systems    Lipid history: On treatment with Lipitor for about a year with the following results    Lab Results  Component Value Date   CHOL 103 03/09/2015   HDL 27* 03/09/2015   LDLCALC 57 03/09/2015   TRIG 96 03/09/2015   CHOLHDL 3.8 03/09/2015           Constitutional: no recent weight gain/loss.  Complains of fatigue especially later in the day   Eyes: She has had some blurred vision in the left eye and has been followed by retinal surgeon in [place].  Has not had any laser treatments Most recent eye exam was 7/16   ENT: no difficulty swallowing  Cardiovascular: no chest pain or tightness on exertion.   She takes 81 mg aspirin as a preventive measure  No significant leg swelling.  Hypertension: none now, has been given ramipril possibly for renal protection She felt dizzy/lightheadedness this afternoon  Respiratory: no cough/shortness of breath  Gastrointestinal: no constipation, diarrhea, nausea or abdominal pain  Musculoskeletal: no muscle/joint aches   Urological:   No frequency of urination or  nocturia  Skin: no rash.  She is still having issues with infections in her feet particularly the left and has history of osteomyelitis in toes Recently had first left toe amputated because of persistent infection, followed by orthopedic surgeon  Neurological: no headaches.   Has numbness in her feet for some time, worse in 1 year; also has some numbness in her hands especially right. No burning or tingling in feet   On gabapentin for sharp pains  in in her feet especially the right, this is more in the evenings.  However she does not think she gets enough relief of the pain even with 600 mg  Psychiatric: no recent symptoms of depression, currently on low dose Lexapro  Endocrine: No  cold intolerance or history of thyroid disease She does feel tired especially in the afternoon   No results found for: TSH, FREET4  Recently has had problems with anemia noticed in the hospital, has not been evaluated for this  Lab Results  Component Value Date   WBC 6.8 03/08/2015   HGB 8.0* 03/08/2015   HCT 24.6* 03/08/2015   MCV 81.7 03/08/2015   PLT 452* 03/08/2015     Physical Examination:  BP 112/64 mmHg  Pulse 81  Temp(Src) 98 F (36.7 C)  Resp 14  Ht 5\' 1"  (1.549 m)  Wt 158 lb 9.6 oz (71.94 kg)  BMI 29.98 kg/m2  SpO2 97%  GENERAL:         Patient has mild generalized obesity.   HEENT:         Eye exam shows normal external appearance.  Fundus exam shows micro-aneurysms and exudates scattered in the left side, right side not seeing well.  Oral  exam shows normal mucosa, slight coating of the tongue NECK:   There is no lymphadenopathy Thyroid is not enlarged and no nodules felt.  Carotids are normal to palpation and no bruit heard LUNGS:         Chest is symmetrical. Lungs are clear to auscultation.Marland Kitchen   HEART:         Heart sounds:  S1 and S2 are normal. No murmur or click heard., no S3 or S4.   ABDOMEN:   There is no distention present. Liver and spleen are not palpable. No other mass or tenderness present.   NEUROLOGICAL:   Vibration sense is absent in distal first toes on the left. Ankle jerks are absent bilaterally.          Monofilament sensation absent on the left toes and right distal fingers Pedal pulses not examined, patient has bandages on both sides MUSCULOSKELETAL:  There is no swelling or deformity of the peripheral joints. Spine is normal to inspection.   EXTREMITIES:     There is no edema. No skin lesions  present.Marland Kitchen SKIN:       No rash or lesions of concern.        ASSESSMENT:  Diabetes type 2, uncontrolled    She has had persistently poor control of her diabetes for several years and multiple complications. Currently taking only basal insulin and appears to have significant postprandial hyperglycemia Also with Levemir insulin may not be getting full 24-hour basal effect Appears not to be benefiting much from metformin 1500 mg daily She does not have any particular meal plan and may sometimes have an balanced meals or high fat content.  Also has significant hyperglycemia related to drinking up to 36 ounces of regular soft drinks a day She is also not able to exercise currently because of her continued diabetic foot infections Her treatment regimen is limited by limited financial resources and she cannot afford any brand name insulin products  Complications: Severe peripheral neuropathy with sensory loss; diabetic foot ulcers and bilateral amputations, diabetic retinopathy  Orthostatic hypotension: Unclear of the reason and this may be related to relative dehydration from hyperglycemia, possible GI blood loss and continued use of ramipril  Hypercholesterolemia: Well-controlled with Lipitor 20 mg  PLAN:    She will need to start mealtime coverage along with basal insulin.  Discussed need for covering meal spikes with rapid acting insulin as well as timing of injection as well as duration of action of mealtime insulin  Because she is not able to afford brand name insulin she can try the [employer] brand of the regular insulin with a syringe.  She is familiar with using syringes from before  She will start taking 6-8 units of insulin with each meal based on meal size, mostly will need to 8 units at suppertime  Discussed needing to check two-hour blood sugar readings to help adjust her mealtime doses  She was instructed on using the contour meter to be used in place of her Generic monitor and  ascription for test strips sent for her drugstore to fill  She will move her Levemir to the morning for better daytime effect  Strongly encouraged her to start cutting back on regular soft drinks  Consultation with dietitian for meal planning  She was also shown how she could be treated with a V-go pump device.  Discussed how this would work and may be able to use it with regular insulin.  She is interested in this and will fill out  the paperwork to see if her insurance will cover this.  She will be instructed by nurse educator and most likely will need a 30 unit basal unit plus about 4-6 units at each meal at least  Continue metformin for now  STOP ramipril for now because of her orthostatic hypotension  She may need analgesics in addition to her gabapentin for control of phantom pain in her feet  Continue follow-up with orthopedic surgeon for foot care and probably needs special shoes  For her anemia she will be seen by her PCP as soon as possible, discussed this with her PCP today   Patient Instructions  Check blood sugars on waking up 5-6  times a week Also check blood sugars about 2 hours after a meal and do this after different meals by rotation Check the sugar at least 2-3 times a day in all  Recommended blood sugar levels on waking up is 90-130 and about 2 hours after meal is 130-160  Please bring your blood sugar monitor to each visit, thank you  INSULIN: Change Levemir to the morning and start taking 38 units  REGULAR INSULIN: Take 6 units with breakfast, 8 units with lunch and 8 units with dinner. If eating a small meal with very little carbohydrate or starch may skip the insulin If eating more than 2 starches or any high-fat foods take 10 units.  DIET: Avoid fried foods, high fat meats and cheeses and reduce the regular soft drinks to 6 ounces per meal, drink more water     Kenn Rekowski 03/15/2015, 8:45 PM   Note: This office note was prepared with [employer]. Any transcriptional errors that result from this process are unintentional.

## 2015-03-15 NOTE — Telephone Encounter (Signed)
Called to follow up with patient and schedule an office visit with Elyn Aquas.  No answer.  Left message for call back.

## 2015-03-15 NOTE — Telephone Encounter (Signed)
Dr. Dwyane Dee called regarding patient. Would like for you to call him back. His phone number is [phone]

## 2015-03-15 NOTE — Patient Instructions (Signed)
Check blood sugars on waking up 5-6  times a week Also check blood sugars about 2 hours after a meal and do this after different meals by rotation Check the sugar at least 2-3 times a day in all  Recommended blood sugar levels on waking up is 90-130 and about 2 hours after meal is 130-160  Please bring your blood sugar monitor to each visit, thank you  INSULIN: Change Levemir to the morning and start taking 38 units  REGULAR INSULIN: Take 6 units with breakfast, 8 units with lunch and 8 units with dinner. If eating a small meal with very little carbohydrate or starch may skip the insulin If eating more than 2 starches or any high-fat foods take 10 units.  DIET: Avoid fried foods, high fat meats and cheeses and reduce the regular soft drinks to 6 ounces per meal, drink more water

## 2015-03-15 NOTE — Telephone Encounter (Signed)
Spoke with Dr. Dwyane Dee who assessed patient today. CBC in hospital shows hgb at 8. Patient was mildly hypotensive in office without any note of symptoms pertaining to a GI bleed.  Patient needs follow-up scheduled with me either tomorrow or first thing Monday morning. If she gets any severe dizziness, lightheadedness or SOB she needs to call 911.

## 2015-03-16 ENCOUNTER — Ambulatory Visit (INDEPENDENT_AMBULATORY_CARE_PROVIDER_SITE_OTHER): Payer: PPO | Admitting: Physician Assistant

## 2015-03-16 ENCOUNTER — Encounter: Payer: Self-pay | Admitting: Physician Assistant

## 2015-03-16 VITALS — BP 105/68 | HR 69 | Temp 97.6°F | Resp 16 | Ht 61.0 in | Wt 158.1 lb

## 2015-03-16 DIAGNOSIS — Z794 Long term (current) use of insulin: Secondary | ICD-10-CM

## 2015-03-16 DIAGNOSIS — E11621 Type 2 diabetes mellitus with foot ulcer: Secondary | ICD-10-CM | POA: Diagnosis not present

## 2015-03-16 DIAGNOSIS — L97509 Non-pressure chronic ulcer of other part of unspecified foot with unspecified severity: Secondary | ICD-10-CM

## 2015-03-16 DIAGNOSIS — E1165 Type 2 diabetes mellitus with hyperglycemia: Secondary | ICD-10-CM

## 2015-03-16 DIAGNOSIS — D638 Anemia in other chronic diseases classified elsewhere: Secondary | ICD-10-CM

## 2015-03-16 DIAGNOSIS — E11319 Type 2 diabetes mellitus with unspecified diabetic retinopathy without macular edema: Secondary | ICD-10-CM | POA: Diagnosis not present

## 2015-03-16 LAB — CBC WITH DIFFERENTIAL/PLATELET
Basophils Absolute: 0.1 10*3/uL (ref 0.0–0.1)
Basophils Relative: 1 % (ref 0–1)
EOS PCT: 4 % (ref 0–5)
Eosinophils Absolute: 0.3 10*3/uL (ref 0.0–0.7)
HEMATOCRIT: 27 % — AB (ref 36.0–46.0)
Hemoglobin: 8.8 g/dL — ABNORMAL LOW (ref 12.0–15.0)
LYMPHS PCT: 39 % (ref 12–46)
Lymphs Abs: 3.2 10*3/uL (ref 0.7–4.0)
MCH: 26 pg (ref 26.0–34.0)
MCHC: 32.6 g/dL (ref 30.0–36.0)
MCV: 79.9 fL (ref 78.0–100.0)
MONO ABS: 0.6 10*3/uL (ref 0.1–1.0)
MPV: 8 fL — ABNORMAL LOW (ref 8.6–12.4)
Monocytes Relative: 7 % (ref 3–12)
Neutro Abs: 4 10*3/uL (ref 1.7–7.7)
Neutrophils Relative %: 49 % (ref 43–77)
Platelets: 535 10*3/uL — ABNORMAL HIGH (ref 150–400)
RBC: 3.38 MIL/uL — AB (ref 3.87–5.11)
RDW: 15.1 % (ref 11.5–15.5)
WBC: 8.2 10*3/uL (ref 4.0–10.5)

## 2015-03-16 LAB — BASIC METABOLIC PANEL
BUN: 21 mg/dL (ref 7–25)
CO2: 29 mmol/L (ref 20–31)
Calcium: 9.5 mg/dL (ref 8.6–10.4)
Chloride: 101 mmol/L (ref 98–110)
Creat: 0.73 mg/dL (ref 0.50–1.05)
Glucose, Bld: 151 mg/dL — ABNORMAL HIGH (ref 65–99)
POTASSIUM: 5.3 mmol/L (ref 3.5–5.3)
Sodium: 140 mmol/L (ref 135–146)

## 2015-03-16 NOTE — Telephone Encounter (Signed)
Pt returned call.  Appt scheduled with Elyn Aquas, PA-C on 03/16/15 @ 3:15 pm.

## 2015-03-16 NOTE — Assessment & Plan Note (Signed)
S/p amputation with routine healing. Has follow-up scheduled with wound specialist.

## 2015-03-16 NOTE — Assessment & Plan Note (Signed)
Will obtain repeat CBC and BMP today. BP stable. Asymptomatic. Will continue holding the ramipril.

## 2015-03-16 NOTE — Progress Notes (Signed)
Patient presents to clinic today c/o for follow-up after amputation of L great toe secondary to ulcer and osteomyelitis. Patient has been evaluated by Endocrinology (Dr. Dwyane Dee) who has set her up for insulin pump to get her diabetes under better control. Unfortunately there is significant nerve damage and retinopathy. Patient's CBC at visit with Endo noted a hgb at 8. Patient's BP noticed to be slightly low and so she was scheduled to be seen here. Patient endorses feeling great overall. Is holding her ramipril as directed. Denies lightheadedness, dizziness, palpitations or SOB.   Past Medical History  Diagnosis Date  . Bipolar 1 disorder ([place])   . Hypertension     past hx of  . H/O hiatal hernia   . Numbness and tingling     Hx; of in B/LLE and B/LUE  . Hyperlipidemia   . Diabetes mellitus     INSULIN DEPENDENT  . Diabetic neuropathy ([place])   . Diabetic neuropathy ([place])   . Cellulitis and abscess of foot 03/08/2015    Current Outpatient Prescriptions on File Prior to Visit  Medication Sig Dispense Refill  . aspirin 81 MG tablet Take 81 mg by mouth daily.    Marland Kitchen atorvastatin (LIPITOR) 20 MG tablet Take 1 tablet (20 mg total) by mouth at bedtime. 30 tablet 5  . BAYER MICROLET LANCETS lancets Use as instructed to check blood sugar 3 times per day 100 each 3  . escitalopram (LEXAPRO) 5 MG tablet Take 7.5 mg by mouth every morning.     . gabapentin (NEURONTIN) 300 MG capsule Take 600 mg each at breakfast and lunch. Take 900 mg at night. (Patient taking differently: 600-900 mg 3 (three) times daily. Take 2 capsules at breakfast & lunch Take 3 capsules at bedtime) 210 capsule 5  . glucose blood (BAYER CONTOUR NEXT TEST) test strip Use as instructed to check blood sugar 3 times per day dx code E11.39 100 each 3  . Insulin Detemir (LEVEMIR FLEXTOUCH) 100 UNIT/ML Pen Increase by 2 units every 3-4 days until Am glucose 80-130 (Patient taking differently: Inject 38 Units into the skin every  morning. ) 15 mL 5  . insulin regular (NOVOLIN R RELION) 100 units/mL injection Inject 6-10 units three times a day with meals. 10 mL 3  . metFORMIN (GLUCOPHAGE) 500 MG tablet Take 1000 mg each morning and 500 mg each evening (Patient taking differently: Take 500-1,000 mg by mouth 2 (two) times daily with a meal. Take 2 tablets in the morning Take 1 tablet in the evening) 90 tablet 5   No current facility-administered medications on file prior to visit.    Allergies  Allergen Reactions  . Ativan [Lorazepam] Other (See Comments)    Abnormal behavior  . Darvocet [Propoxyphene N-Acetaminophen] Itching  . Demerol [Meperidine] Other (See Comments)    Abnormal behavior  . Latex Itching  . Lithium Nausea And Vomiting    Can not keep this medication down. It makes her terribly ill.  . Oxycodone Other (See Comments)    Abnormal behavior    Family History  Problem Relation Age of Onset  . Diabetes Mother   . Mental illness Mother   . Hypertension Father   . Diabetes Maternal Grandmother   . Heart disease Neg Hx     Social History   Social History  . Marital Status: Married    Spouse Name: N/A  . Number of Children: N/A  . Years of Education: N/A   Social History Main Topics  .  Smoking status: Never Smoker   . Smokeless tobacco: Never Used  . Alcohol Use: No  . Drug Use: No  . Sexual Activity: Not on file   Other Topics Concern  . Not on file   Social History Narrative    Review of Systems - See HPI.  All other ROS are negative.  BP 105/68 mmHg  Pulse 69  Temp(Src) 97.6 F (36.4 C) (Oral)  Resp 16  Ht '5\' 1"'  (1.549 m)  Wt 158 lb 2 oz (71.725 kg)  BMI 29.89 kg/m2  SpO2 100%  Physical Exam  Constitutional: She is oriented to person, place, and time and well-developed, well-nourished, and in no distress.  HENT:  Head: Normocephalic and atraumatic.  Eyes: Conjunctivae are normal.  Neck: Neck supple.  Cardiovascular: Normal rate, regular rhythm, normal heart  sounds and intact distal pulses.   Pulmonary/Chest: Effort normal and breath sounds normal. No respiratory distress. She has no wheezes. She has no rales. She exhibits no tenderness.  Neurological: She is alert and oriented to person, place, and time.  Skin: Skin is warm and dry. No rash noted.  L foot is s/p left great toe amputations with bandages in place.  Psychiatric: Affect normal.  Vitals reviewed.   Recent Results (from the past 2160 hour(s))  Basic Metabolic Panel (BMET)     Status: Abnormal   Collection Time: 02/21/15  2:47 PM  Result Value Ref Range   Sodium 132 (L) 135 - 145 mEq/L   Potassium 4.8 3.5 - 5.1 mEq/L   Chloride 94 (L) 96 - 112 mEq/L   CO2 33 (H) 19 - 32 mEq/L   Glucose, Bld 463 (H) 70 - 99 mg/dL   BUN 16 6 - 23 mg/dL   Creatinine, Ser 0.89 0.40 - 1.20 mg/dL   Calcium 9.2 8.4 - 10.5 mg/dL   GFR 69.22 >60.00 mL/min  Hemoglobin A1c     Status: Abnormal   Collection Time: 02/21/15  2:47 PM  Result Value Ref Range   Hgb A1c MFr Bld 13.0 (H) 4.6 - 6.5 %    Comment: Glycemic Control Guidelines for People with Diabetes:Non Diabetic:  <6%Goal of Therapy: <7%Additional Action Suggested:  >8%   Comprehensive metabolic panel     Status: Abnormal   Collection Time: 03/07/15  7:15 PM  Result Value Ref Range   Sodium 132 (L) 135 - 145 mmol/L   Potassium 4.5 3.5 - 5.1 mmol/L   Chloride 98 (L) 101 - 111 mmol/L   CO2 27 22 - 32 mmol/L   Glucose, Bld 199 (H) 65 - 99 mg/dL   BUN 28 (H) 6 - 20 mg/dL   Creatinine, Ser 0.95 0.44 - 1.00 mg/dL   Calcium 9.1 8.9 - 10.3 mg/dL   Total Protein 8.3 (H) 6.5 - 8.1 g/dL   Albumin 2.9 (L) 3.5 - 5.0 g/dL   AST 16 15 - 41 U/L   ALT 17 14 - 54 U/L   Alkaline Phosphatase 124 38 - 126 U/L   Total Bilirubin 0.4 0.3 - 1.2 mg/dL   GFR calc non Af Amer >60 >60 mL/min   GFR calc Af Amer >60 >60 mL/min    Comment: (NOTE) The eGFR has been calculated using the CKD EPI equation. This calculation has not been validated in all clinical  situations. eGFR's persistently <60 mL/min signify possible Chronic Kidney Disease.    Anion gap 7 5 - 15  CBC with Differential     Status: Abnormal   Collection Time:  03/07/15  7:15 PM  Result Value Ref Range   WBC 11.1 (H) 4.0 - 10.5 K/uL   RBC 3.29 (L) 3.87 - 5.11 MIL/uL   Hemoglobin 8.6 (L) 12.0 - 15.0 g/dL   HCT 27.0 (L) 36.0 - 46.0 %   MCV 82.1 78.0 - 100.0 fL   MCH 26.1 26.0 - 34.0 pg   MCHC 31.9 30.0 - 36.0 g/dL   RDW 14.2 11.5 - 15.5 %   Platelets 565 (H) 150 - 400 K/uL   Neutrophils Relative % 70 %   Neutro Abs 7.8 (H) 1.7 - 7.7 K/uL   Lymphocytes Relative 19 %   Lymphs Abs 2.2 0.7 - 4.0 K/uL   Monocytes Relative 8 %   Monocytes Absolute 0.8 0.1 - 1.0 K/uL   Eosinophils Relative 3 %   Eosinophils Absolute 0.3 0.0 - 0.7 K/uL   Basophils Relative 0 %   Basophils Absolute 0.0 0.0 - 0.1 K/uL  Culture, blood (routine x 2)     Status: None   Collection Time: 03/07/15  7:20 PM  Result Value Ref Range   Specimen Description BLOOD LEFT ANTECUBITAL    Special Requests BOTTLES DRAWN AEROBIC AND ANAEROBIC 5CC EACH    Culture      NO GROWTH 5 DAYS Performed at [facility]    Report Status 03/12/2015 FINAL   Culture, blood (routine x 2)     Status: None   Collection Time: 03/07/15  7:25 PM  Result Value Ref Range   Specimen Description BLOOD RIGHT ANTECUBITAL    Special Requests BOTTLES DRAWN AEROBIC AND ANAEROBIC 5ML EACH    Culture      NO GROWTH 5 DAYS Performed at [facility]    Report Status 03/12/2015 FINAL   I-Stat CG4 Lactic Acid, ED     Status: None   Collection Time: 03/07/15  7:26 PM  Result Value Ref Range   Lactic Acid, Venous 1.35 0.5 - 2.0 mmol/L  Glucose, capillary     Status: Abnormal   Collection Time: 03/07/15 11:07 PM  Result Value Ref Range   Glucose-Capillary 187 (H) 65 - 99 mg/dL  Basic metabolic panel     Status: Abnormal   Collection Time: 03/08/15  5:05 AM  Result Value Ref Range   Sodium 133 (L) 135 - 145 mmol/L    Potassium 4.4 3.5 - 5.1 mmol/L   Chloride 99 (L) 101 - 111 mmol/L   CO2 29 22 - 32 mmol/L   Glucose, Bld 196 (H) 65 - 99 mg/dL   BUN 16 6 - 20 mg/dL   Creatinine, Ser 0.77 0.44 - 1.00 mg/dL   Calcium 8.8 (L) 8.9 - 10.3 mg/dL   GFR calc non Af Amer >60 >60 mL/min   GFR calc Af Amer >60 >60 mL/min    Comment: (NOTE) The eGFR has been calculated using the CKD EPI equation. This calculation has not been validated in all clinical situations. eGFR's persistently <60 mL/min signify possible Chronic Kidney Disease.    Anion gap 5 5 - 15  CBC     Status: Abnormal   Collection Time: 03/08/15  5:05 AM  Result Value Ref Range   WBC 6.8 4.0 - 10.5 K/uL   RBC 3.01 (L) 3.87 - 5.11 MIL/uL   Hemoglobin 8.0 (L) 12.0 - 15.0 g/dL   HCT 24.6 (L) 36.0 - 46.0 %   MCV 81.7 78.0 - 100.0 fL   MCH 26.6 26.0 - 34.0 pg   MCHC 32.5  30.0 - 36.0 g/dL   RDW 14.6 11.5 - 15.5 %   Platelets 452 (H) 150 - 400 K/uL  Protime-INR     Status: Abnormal   Collection Time: 03/08/15  5:05 AM  Result Value Ref Range   Prothrombin Time 16.3 (H) 11.6 - 15.2 seconds   INR 1.29 0.00 - 1.49  Lithium level     Status: Abnormal   Collection Time: 03/08/15  5:05 AM  Result Value Ref Range   Lithium Lvl <0.06 (L) 0.60 - 1.20 mmol/L  Glucose, capillary     Status: Abnormal   Collection Time: 03/08/15  7:21 AM  Result Value Ref Range   Glucose-Capillary 158 (H) 65 - 99 mg/dL  Glucose, capillary     Status: Abnormal   Collection Time: 03/08/15 11:33 AM  Result Value Ref Range   Glucose-Capillary 220 (H) 65 - 99 mg/dL  Glucose, capillary     Status: Abnormal   Collection Time: 03/08/15  4:54 PM  Result Value Ref Range   Glucose-Capillary 240 (H) 65 - 99 mg/dL  Glucose, capillary     Status: Abnormal   Collection Time: 03/08/15  9:39 PM  Result Value Ref Range   Glucose-Capillary 215 (H) 65 - 99 mg/dL   Comment 1 Notify RN    Comment 2 Document in Chart   Lipid panel     Status: Abnormal   Collection Time: 03/09/15   4:45 AM  Result Value Ref Range   Cholesterol 103 0 - 200 mg/dL   Triglycerides 96 <150 mg/dL   HDL 27 (L) >40 mg/dL   Total CHOL/HDL Ratio 3.8 RATIO   VLDL 19 0 - 40 mg/dL   LDL Cholesterol 57 0 - 99 mg/dL    Comment:        Total Cholesterol/HDL:CHD Risk Coronary Heart Disease Risk Table                     Men   Women  1/2 Average Risk   3.4   3.3  Average Risk       5.0   4.4  2 X Average Risk   9.6   7.1  3 X Average Risk  23.4   11.0        Use the calculated Patient Ratio above and the CHD Risk Table to determine the patient's CHD Risk.        ATP III CLASSIFICATION (LDL):  <100     mg/dL   Optimal  100-129  mg/dL   Near or Above                    Optimal  130-159  mg/dL   Borderline  160-189  mg/dL   High  >190     mg/dL   Very High   Surgical pcr screen     Status: None   Collection Time: 03/09/15  5:15 AM  Result Value Ref Range   MRSA, PCR NEGATIVE NEGATIVE   Staphylococcus aureus NEGATIVE NEGATIVE    Comment:        The Xpert SA Assay (FDA approved for NASAL specimens in patients over 89 years of age), is one component of a comprehensive surveillance program.  Test performance has been validated by [facility] for patients greater than or equal to 81 year old. It is not intended to diagnose infection nor to guide or monitor treatment.   Glucose, capillary     Status: Abnormal   Collection Time:  03/09/15  6:14 AM  Result Value Ref Range   Glucose-Capillary 149 (H) 65 - 99 mg/dL   Comment 1 Notify RN    Comment 2 Document in Chart   Glucose, capillary     Status: Abnormal   Collection Time: 03/09/15 12:01 PM  Result Value Ref Range   Glucose-Capillary 129 (H) 65 - 99 mg/dL  Glucose, capillary     Status: None   Collection Time: 03/09/15  2:03 PM  Result Value Ref Range   Glucose-Capillary 74 65 - 99 mg/dL  Glucose, capillary     Status: None   Collection Time: 03/09/15  2:41 PM  Result Value Ref Range   Glucose-Capillary 91 65 - 99 mg/dL    Glucose, capillary     Status: None   Collection Time: 03/09/15  4:02 PM  Result Value Ref Range   Glucose-Capillary 80 65 - 99 mg/dL   Comment 1 Notify RN    Comment 2 Document in Chart   Glucose, capillary     Status: Abnormal   Collection Time: 03/09/15  4:52 PM  Result Value Ref Range   Glucose-Capillary 100 (H) 65 - 99 mg/dL   Comment 1 Document in Chart   Glucose, capillary     Status: Abnormal   Collection Time: 03/09/15  9:39 PM  Result Value Ref Range   Glucose-Capillary 116 (H) 65 - 99 mg/dL  Glucose, capillary     Status: None   Collection Time: 03/10/15  6:45 AM  Result Value Ref Range   Glucose-Capillary 99 65 - 99 mg/dL  Glucose, capillary     Status: Abnormal   Collection Time: 03/10/15 11:23 AM  Result Value Ref Range   Glucose-Capillary 116 (H) 65 - 99 mg/dL  Glucose, capillary     Status: Abnormal   Collection Time: 03/10/15  4:34 PM  Result Value Ref Range   Glucose-Capillary 249 (H) 65 - 99 mg/dL  Glucose, capillary     Status: Abnormal   Collection Time: 03/10/15  9:15 PM  Result Value Ref Range   Glucose-Capillary 154 (H) 65 - 99 mg/dL  Glucose, capillary     Status: Abnormal   Collection Time: 03/11/15  6:08 AM  Result Value Ref Range   Glucose-Capillary 185 (H) 65 - 99 mg/dL  Basic metabolic panel     Status: Abnormal   Collection Time: 03/11/15  7:33 AM  Result Value Ref Range   Sodium 136 135 - 145 mmol/L   Potassium 3.8 3.5 - 5.1 mmol/L   Chloride 102 101 - 111 mmol/L   CO2 25 22 - 32 mmol/L   Glucose, Bld 177 (H) 65 - 99 mg/dL   BUN 8 6 - 20 mg/dL   Creatinine, Ser 0.65 0.44 - 1.00 mg/dL   Calcium 8.6 (L) 8.9 - 10.3 mg/dL   GFR calc non Af Amer >60 >60 mL/min   GFR calc Af Amer >60 >60 mL/min    Comment: (NOTE) The eGFR has been calculated using the CKD EPI equation. This calculation has not been validated in all clinical situations. eGFR's persistently <60 mL/min signify possible Chronic Kidney Disease.    Anion gap 9 5 - 15   Glucose, capillary     Status: Abnormal   Collection Time: 03/11/15 11:32 AM  Result Value Ref Range   Glucose-Capillary 249 (H) 65 - 99 mg/dL   Comment 1 Document in Chart   Glucose, capillary     Status: Abnormal   Collection Time: 03/11/15  4:34 PM  Result Value Ref Range   Glucose-Capillary 227 (H) 65 - 99 mg/dL  Glucose, capillary     Status: Abnormal   Collection Time: 03/11/15  9:30 PM  Result Value Ref Range   Glucose-Capillary 240 (H) 65 - 99 mg/dL  Glucose, capillary     Status: Abnormal   Collection Time: 03/12/15  6:33 AM  Result Value Ref Range   Glucose-Capillary 154 (H) 65 - 99 mg/dL  Glucose, capillary     Status: Abnormal   Collection Time: 03/12/15 11:17 AM  Result Value Ref Range   Glucose-Capillary 190 (H) 65 - 99 mg/dL   Comment 1 Repeat Test    Comment 2 Document in Chart     Assessment/Plan: Anemia Will obtain repeat CBC and BMP today. BP stable. Asymptomatic. Will continue holding the ramipril.  Diabetic ulcer of toe ([place]) S/p amputation with routine healing. Has follow-up scheduled with wound specialist.  DM (diabetes mellitus) type II uncontrolled with eye manifestation ([place]) Has seen Endocrinology and is being set up for insulin pump to gain better control of sugars. Continue current regimen. Follow-up as scheduled.

## 2015-03-16 NOTE — Patient Instructions (Signed)
Continue holding your ramipril. Stop by the lab for blood work. I will call you with your results and we will go from there. Stay well hydrated and do not skip meals.  Follow-up with Dr. Dwyane Dee and the wound care specialist as directed.  Follow-up with me will be determined based on your results.

## 2015-03-16 NOTE — Progress Notes (Signed)
Pre visit review using our clinic review tool, if applicable. No additional management support is needed unless otherwise documented below in the visit note/SLS  

## 2015-03-16 NOTE — Assessment & Plan Note (Signed)
Has seen Endocrinology and is being set up for insulin pump to gain better control of sugars. Continue current regimen. Follow-up as scheduled.

## 2015-03-19 ENCOUNTER — Telehealth: Payer: Self-pay | Admitting: Endocrinology

## 2015-03-19 ENCOUNTER — Other Ambulatory Visit: Payer: Self-pay | Admitting: *Deleted

## 2015-03-19 ENCOUNTER — Telehealth: Payer: Self-pay | Admitting: *Deleted

## 2015-03-19 DIAGNOSIS — D638 Anemia in other chronic diseases classified elsewhere: Secondary | ICD-10-CM

## 2015-03-19 MED ORDER — INSULIN REGULAR HUMAN 100 UNIT/ML IJ SOLN: INTRAMUSCULAR | Status: DC

## 2015-03-19 NOTE — Telephone Encounter (Signed)
rx sent to [facility] in [place].

## 2015-03-19 NOTE — Telephone Encounter (Signed)
Patient informed, understood & agreed,; scheduled Lab appointment for Tuesday, April 03, 2015 at 1:15p for repeat CBC/SLS

## 2015-03-19 NOTE — Telephone Encounter (Signed)
Patient need a refill of, insulin regular (NOVOLIN R RELION) 100 units/mL injection send to [facility] in [place] phone # [phone]

## 2015-03-19 NOTE — Telephone Encounter (Signed)
-----   Message from Brunetta Jeans, PA-C sent at 03/18/2015  4:47 PM EST ----- Anemia is improving which is a great sign. Her BP was better at visit. Have her continue to hold the lisinopril. Follow-up to lab 2 weeks for a recheck of CBC.

## 2015-03-20 ENCOUNTER — Other Ambulatory Visit: Payer: Self-pay | Admitting: *Deleted

## 2015-03-20 ENCOUNTER — Telehealth: Payer: Self-pay | Admitting: Endocrinology

## 2015-03-20 NOTE — Telephone Encounter (Signed)
Patient need a refill of her test strips,  [facility] [place], [place] [phone] (Phone) [phone] (Fax)

## 2015-03-20 NOTE — Telephone Encounter (Signed)
rx faxed to [facility] per patient request.

## 2015-03-22 ENCOUNTER — Telehealth: Payer: Self-pay | Admitting: Physician Assistant

## 2015-03-22 NOTE — Telephone Encounter (Signed)
Caller name: Lelon Frohlich Relation to pt: LPN [facility]  Call back number: [phone]   Pharmacy:  Reason for call:  Requesting assistance from [place] regarding contacting patient to advise her to enroll in transitional care program since patient was hospitalized.  A program designed to educate the patient with new medication prescribed while being in the hospital and checking on patient once a month. [facility] was unsuccessful with contacting patient and would like [place] to contact patient and advise her to call [employer].

## 2015-03-22 NOTE — Telephone Encounter (Addendum)
Called patient and left a message for call back  

## 2015-03-28 ENCOUNTER — Other Ambulatory Visit: Payer: Self-pay | Admitting: *Deleted

## 2015-03-28 MED ORDER — V-GO 30 KIT: PACK | Status: DC

## 2015-03-30 NOTE — Telephone Encounter (Addendum)
Called Dana [name] Left a message for call back.

## 2015-03-30 NOTE — Telephone Encounter (Signed)
Dana [name] stated that she has tried several times through phone and mail to get in contact with patient and has been unable to do so.  She says at this point she plans to close patient's case.  No further action required.

## 2015-04-02 ENCOUNTER — Telehealth: Payer: Self-pay | Admitting: Endocrinology

## 2015-04-02 NOTE — Telephone Encounter (Signed)
Use Relion R

## 2015-04-02 NOTE — Telephone Encounter (Signed)
Patient called stating that her pump medication will be too high for her to pay  Is there another alternative   Please advise

## 2015-04-02 NOTE — Telephone Encounter (Signed)
Please see below.

## 2015-04-02 NOTE — Telephone Encounter (Signed)
Detailed message left on patient's voicemail.

## 2015-04-03 ENCOUNTER — Telehealth: Payer: Self-pay | Admitting: Endocrinology

## 2015-04-03 ENCOUNTER — Other Ambulatory Visit: Payer: PPO

## 2015-04-03 ENCOUNTER — Encounter: Payer: PPO | Admitting: Nutrition

## 2015-04-03 NOTE — Telephone Encounter (Signed)
Message left for patient to return call.

## 2015-04-03 NOTE — Telephone Encounter (Signed)
Patient called stating that she would like to speak with Suanne Marker regarding her levemir   Please advise   Thank you

## 2015-04-11 ENCOUNTER — Encounter: Payer: PPO | Attending: Endocrinology | Admitting: Nutrition

## 2015-04-11 ENCOUNTER — Ambulatory Visit: Payer: PPO | Admitting: Endocrinology

## 2015-04-11 DIAGNOSIS — E1142 Type 2 diabetes mellitus with diabetic polyneuropathy: Secondary | ICD-10-CM | POA: Diagnosis not present

## 2015-04-11 DIAGNOSIS — Z713 Dietary counseling and surveillance: Secondary | ICD-10-CM | POA: Diagnosis not present

## 2015-04-11 NOTE — Patient Instructions (Signed)
Apply a new V-go every 24 hours. Test blood sugars before meals and at bedtime and  Call results to Dr. Ronnie Derby office on Monday. Stop taking the Levemir. Give 2-3 button presses with breakfast and lunch and 3 button presses for supper

## 2015-04-11 NOTE — Progress Notes (Signed)
Patient was trained on how to fill, apply and use the V-go 30. she redemonstrated how to fill this with normal saline, because she did not bring her R insulin.  She reported good understanding of how to fill, and apply, and how to give the meal time insulin She was given a V-go 30 starter kit with 6 days of V-Gos, and directions for filling, applying and removing it. She was also given a card with [employer] customer care if she has questions.   She says that she ran out of levemir and did not take her injection last night.   She was reminded to take no more levemir.  Written instructions were given for this and to take 2-3 button presses for each meal with 3 button presses for supper meal.   She continues to drink Sweetend Dr. Malachi Bonds at meals, and was warned that she will need extra insulin for this, causing her to gain weight.   She was given a Freestyle Lite to test ac and HS with 30 test strips.  She was shown how to use this meter and reported good understanding of this.   She had no final questions.

## 2015-04-16 ENCOUNTER — Telehealth: Payer: Self-pay | Admitting: Endocrinology

## 2015-04-16 ENCOUNTER — Other Ambulatory Visit: Payer: Self-pay | Admitting: *Deleted

## 2015-04-16 MED ORDER — INSULIN DETEMIR 100 UNIT/ML FLEXPEN: 38.0000 [IU] | PEN_INJECTOR | Freq: Every morning | SUBCUTANEOUS | Status: DC

## 2015-04-16 MED ORDER — INSULIN REGULAR HUMAN 100 UNIT/ML IJ SOLN: INTRAMUSCULAR | Status: DC

## 2015-04-16 NOTE — Telephone Encounter (Signed)
Instructions given to patient, rx sent

## 2015-04-16 NOTE — Telephone Encounter (Signed)
INSULIN: Levemir in the morning and start taking 38 units on 12/6  REGULAR INSULIN: Take 6 units with breakfast, 8 units with lunch and 8 units with dinner.  F/u in 1 week

## 2015-04-16 NOTE — Telephone Encounter (Signed)
Pt will be out of vgo tomorrow, cannot use the discount card, please advise on how to proceed

## 2015-04-16 NOTE — Telephone Encounter (Signed)
Pt was told by linda to call and speak with Suanne Marker, please call back when you can

## 2015-04-16 NOTE — Telephone Encounter (Signed)
Patient called with her blood sugar readings for the past 4 days. All readings are before eating. Last ones are at bedtime.  12/2- 129, 169, 108, 177 12/3- 119, 263, 209, 187 12/4- 140, 171, 123, 113 12/5- 98

## 2015-04-18 ENCOUNTER — Ambulatory Visit: Payer: PPO | Admitting: Endocrinology

## 2015-04-23 ENCOUNTER — Ambulatory Visit (INDEPENDENT_AMBULATORY_CARE_PROVIDER_SITE_OTHER): Payer: PPO | Admitting: Endocrinology

## 2015-04-23 ENCOUNTER — Encounter: Payer: Self-pay | Admitting: Endocrinology

## 2015-04-23 VITALS — BP 126/80 | HR 78 | Temp 97.8°F | Resp 14 | Ht 62.0 in | Wt 163.6 lb

## 2015-04-23 DIAGNOSIS — Z794 Long term (current) use of insulin: Secondary | ICD-10-CM

## 2015-04-23 DIAGNOSIS — E1165 Type 2 diabetes mellitus with hyperglycemia: Secondary | ICD-10-CM | POA: Diagnosis not present

## 2015-04-23 NOTE — Progress Notes (Signed)
Patient ID: Dana [name], female   DOB: 1955-07-27, 59 y.o.   MRN: [id number]           Reason for Appointment: Follow-up for Type 2 Diabetes  Referring physician: Elyn Aquas  History of Present Illness:          Date of diagnosis of type 2 diabetes mellitus: 2006       Background history:   She thinks her blood sugar was 300-400 at the time of diagnosis and she was started on insulin soon after this. She thinks she has been on metformin only for the last 5 years Her A1c history is available since only about 2013 and this had been consistently over 10%  Recent history:    She has has had  poor control of her diabetes and last A1c of 13%  She had previously been on Lantus and this was changed this year to Levemir Because of her poor control and need for multiple injections and cost of brand name insulin she was started on the V-go pump after instruction by the nurse educator on 04/11/15 With this her blood sugars started improving and on 04/17/15 her blood sugars were nearly normal  However subsequently with not being able to afford the pump since her co-pay was high she has been taking only Regular Insulin 3 times a day and her blood sugars have started increasing again  INSULIN regimen is:   REGULAR INSULIN: Take 6 units with breakfast, 8 units with lunch and 8 units with dinner.(Levemir in the morning at 38 units on 12/6)  Current blood sugar patterns and problems identified:    blood sugars have been checked more regularly and she is now using the FreeStyle monitor  Since she stopped the V-go pump her fasting readings have been as high as 202  Blood sugars are also relatively higher the rest of the day with highest readings at bedtime off about 200  She was advised by the nurse educator to improve her diet with reducing candy bars and a lot of soft drinks; she is now drinking only about 12 ounces of the regular soft drinks per day  She is still not able to do much  physical activity and has gained some weight   Non-insulin hypoglycemic drugs the patient is taking are: Metformin Side effects from medications have been: None  Compliance with the medical regimen: Improving Hypoglycemia: none   Glucose monitoring:  done 2-3 times a day         Glucometer:  FreeStyle .      Blood Glucose readings by time of day as above, average glucose for the last 28 days is 164+/-42  Self-care: The diet that the patient has been following is: none.     Typical meal intake: Breakfast is irregular otherwise may have toast and an egg or sausage.  Lunch is a sandwich or cheeseburger, evening meal is meat and 2 vegetables.  For snacks he will have peanut butter crackers     She has regular drinks, about 12 ounces daily recently          Dietician visit, most recent: Never               Exercise: none for several months  Weight history: Stable over the last 3-4 years, was 400 pounds about 5 years ago  Wt Readings from Last 3 Encounters:  04/23/15 163 lb 9.6 oz (74.208 kg)  03/16/15 158 lb 2 oz (71.725 kg)  03/15/15 158  lb 9.6 oz (71.94 kg)    Glycemic control:   Lab Results  Component Value Date   HGBA1C 13.0* 02/21/2015   HGBA1C 10.1* 11/08/2014   HGBA1C 11.8* 10/24/2011   Lab Results  Component Value Date   MICROALBUR 4.0* 11/08/2014   LDLCALC 57 03/09/2015   CREATININE 0.73 03/16/2015         Medication List       This list is accurate as of: 04/23/15  3:13 PM.  Always use your most recent med list.               aspirin 81 MG tablet  Take 81 mg by mouth daily.     atorvastatin 20 MG tablet  Commonly known as:  LIPITOR  Take 1 tablet (20 mg total) by mouth at bedtime.     BAYER MICROLET LANCETS lancets  Use as instructed to check blood sugar 3 times per day     escitalopram 5 MG tablet  Commonly known as:  LEXAPRO  Take 7.5 mg by mouth every morning.     gabapentin 300 MG capsule  Commonly known as:  NEURONTIN  Take 600 mg each  at breakfast and lunch. Take 900 mg at night.     glucose blood test strip  Commonly known as:  BAYER CONTOUR NEXT TEST  Use as instructed to check blood sugar 3 times per day dx code E11.39     Insulin Detemir 100 UNIT/ML Pen  Commonly known as:  LEVEMIR FLEXTOUCH  Inject 38 Units into the skin every morning.     insulin regular 100 units/mL injection  Commonly known as:  NOVOLIN R RELION  Take 6 units with breakfast, 8 units with lunch and 8 units with dinner     metFORMIN 500 MG tablet  Commonly known as:  GLUCOPHAGE  Take 1000 mg each morning and 500 mg each evening     V-GO 30 Kit  Use one per day        Allergies:  Allergies  Allergen Reactions  . Ativan [Lorazepam] Other (See Comments)    Abnormal behavior  . Darvocet [Propoxyphene N-Acetaminophen] Itching  . Demerol [Meperidine] Other (See Comments)    Abnormal behavior  . Latex Itching  . Lithium Nausea And Vomiting    Can not keep this medication down. It makes her terribly ill.  . Oxycodone Other (See Comments)    Abnormal behavior    Past Medical History  Diagnosis Date  . Bipolar 1 disorder ([place])   . Hypertension     past hx of  . H/O hiatal hernia   . Numbness and tingling     Hx; of in B/LLE and B/LUE  . Hyperlipidemia   . Diabetes mellitus     INSULIN DEPENDENT  . Diabetic neuropathy ([place])   . Diabetic neuropathy ([place])   . Cellulitis and abscess of foot 03/08/2015    Past Surgical History  Procedure Laterality Date  . Breast surgery    . Abdominal hysterectomy    . Nasal septum surgery    . Amputation  10/24/2011    Procedure: AMPUTATION RAY;  Surgeon: Newt Minion, MD;  Location: [facility];  Service: Orthopedics;  Laterality: Right;  Right Foot 3rd Ray Amputation  . Tonsillectomy    . Adenoidectomy      Hx: of  . Amputation Right 12/16/2012    Procedure: Right Foot Transmetatarsal Amputation;  Surgeon: Newt Minion, MD;  Location: [facility];  Service: Orthopedics;  Laterality: Right;  .  Bladder tact   2002  . Colon surgery    . Amputation Left 03/09/2015    Procedure: LEFT FOOT 1ST RAY AMPUTATION;  Surgeon: Marcus Duda V, MD;  Location: [facility];  Service: Orthopedics;  Laterality: Left;    Family History  Problem Relation Age of Onset  . Diabetes Mother   . Mental illness Mother   . Hypertension Father   . Diabetes Maternal Grandmother   . Heart disease Neg Hx     Social History:  reports that she has never smoked. She has never used smokeless tobacco. She reports that she does not drink alcohol or use illicit drugs.    Review of Systems    Lipid history: On treatment with Lipitor for about a year with the following results    Lab Results  Component Value Date   CHOL 103 03/09/2015   HDL 27* 03/09/2015   LDLCALC 57 03/09/2015   TRIG 96 03/09/2015   CHOLHDL 3.8 03/09/2015          Last diabetic foot exam was in 03/2015, she does have  sensory loss on the left toes     Physical Examination:  BP 126/80 mmHg  Pulse 78  Temp(Src) 97.8 F (36.6 C) (Oral)  Resp 14  Ht 5' 2" (1.575 m)  Wt 163 lb 9.6 oz (74.208 kg)  BMI 29.92 kg/m2  SpO2 97%      ASSESSMENT:  Diabetes type 2, uncontrolled    She has had persistently poor control of her diabetes for several years and multiple complications. See history of present illness for detailed discussion of his current management, blood sugar patterns and problems identified  Currently taking only regular insulin as she ran out of Levemir and cannot afford it Also, she did very well with the V-go pump using the sample she could not get this this month because of cost; she is however apparently going to be able to have this covered by insurance later this month She has subjectively liked using the pump and is probably going to have excellent control with this as judged on the last couple of days of using the pump with only 30 unit basal  PLAN:   She will use a sample of the V-go pump until next week when she  can get it from the drugstore Discussed checking blood sugars, adjusting boluses for various sizes meals and timing of boluses She can continue to use the regular insulin but try to bolus 15-30 minutes before eating Continue metformin Continue improving diet Regular exercise   Patient Instructions  REGULAR INSULIN: Take 8 units with breakfast, 10 units with lunch and 10 units with dinner.  With Pump 2 clicks for Bfst and 3 for supper for larger meals  Keep reducing Dr Pepper  Check blood sugars on waking up 3-4  times a week Also check blood sugars about 2 hours after a meal and do this after different meals by rotation  Recommended blood sugar levels on waking up is 90-130 and about 2 hours after meal is 130-160  Please bring your blood sugar monitor to each visit, thank you    Counseling time on subjects discussed above is over 50% of today's 25 minute visit  , 04/23/2015, 3:13 PM   Note: This office note was prepared with Dragon voice recognition system technology. Any transcriptional errors that result from this process are unintentional.  

## 2015-04-23 NOTE — Patient Instructions (Signed)
REGULAR INSULIN: Take 8 units with breakfast, 10 units with lunch and 10 units with dinner.  With Pump 2 clicks for Bfst and 3 for supper for larger meals  Keep reducing Dr Malachi Bonds  Check blood sugars on waking up 3-4  times a week Also check blood sugars about 2 hours after a meal and do this after different meals by rotation  Recommended blood sugar levels on waking up is 90-130 and about 2 hours after meal is 130-160  Please bring your blood sugar monitor to each visit, thank you

## 2015-05-01 ENCOUNTER — Telehealth: Payer: Self-pay | Admitting: Endocrinology

## 2015-05-01 NOTE — Telephone Encounter (Signed)
Patient called stating that her insurance will cover Precision One Touch meter and diabetic supplies   Send to [employer] in [place]    Thank you

## 2015-05-01 NOTE — Telephone Encounter (Signed)
Message left on patients voice mail asking for clarification on the meter?

## 2015-05-03 ENCOUNTER — Other Ambulatory Visit: Payer: Self-pay | Admitting: *Deleted

## 2015-05-03 ENCOUNTER — Telehealth: Payer: Self-pay | Admitting: Endocrinology

## 2015-05-03 MED ORDER — GLUCOSE BLOOD VI STRP: ORAL_STRIP | Status: DC

## 2015-05-03 MED ORDER — FREESTYLE LITE DEVI: Status: DC

## 2015-05-03 MED ORDER — FREESTYLE LANCETS MISC: Status: DC

## 2015-05-03 NOTE — Telephone Encounter (Signed)
Patient stated that insurance will pay for Free style and precision one touch, and she need some test trips.

## 2015-05-03 NOTE — Telephone Encounter (Signed)
Prescriptions have been sent to [facility] for a new meter, strips and lancets

## 2015-05-18 ENCOUNTER — Ambulatory Visit: Payer: PPO | Admitting: Physician Assistant

## 2015-05-18 ENCOUNTER — Telehealth: Payer: Self-pay | Admitting: Physician Assistant

## 2015-05-18 DIAGNOSIS — L97421 Non-pressure chronic ulcer of left heel and midfoot limited to breakdown of skin: Secondary | ICD-10-CM | POA: Diagnosis not present

## 2015-05-18 DIAGNOSIS — L97411 Non-pressure chronic ulcer of right heel and midfoot limited to breakdown of skin: Secondary | ICD-10-CM | POA: Diagnosis not present

## 2015-05-18 DIAGNOSIS — E11621 Type 2 diabetes mellitus with foot ulcer: Secondary | ICD-10-CM | POA: Diagnosis not present

## 2015-05-18 NOTE — Telephone Encounter (Signed)
Pt left VM 1/6 12:06 stating she needed to reschedule, no reason given, left msg for pt to call back to reschedule, charge or no charge?

## 2015-05-18 NOTE — Telephone Encounter (Signed)
No charge this time. 

## 2015-05-21 ENCOUNTER — Ambulatory Visit: Payer: PPO | Admitting: Endocrinology

## 2015-05-21 DIAGNOSIS — S91329A Laceration with foreign body, unspecified foot, initial encounter: Secondary | ICD-10-CM | POA: Diagnosis not present

## 2015-05-24 ENCOUNTER — Ambulatory Visit: Payer: PPO | Admitting: Endocrinology

## 2015-05-25 DIAGNOSIS — S91329A Laceration with foreign body, unspecified foot, initial encounter: Secondary | ICD-10-CM | POA: Diagnosis not present

## 2015-05-25 DIAGNOSIS — L97522 Non-pressure chronic ulcer of other part of left foot with fat layer exposed: Secondary | ICD-10-CM | POA: Diagnosis not present

## 2015-05-25 DIAGNOSIS — L97511 Non-pressure chronic ulcer of other part of right foot limited to breakdown of skin: Secondary | ICD-10-CM | POA: Diagnosis not present

## 2015-05-25 DIAGNOSIS — L97411 Non-pressure chronic ulcer of right heel and midfoot limited to breakdown of skin: Secondary | ICD-10-CM | POA: Diagnosis not present

## 2015-05-25 DIAGNOSIS — L97421 Non-pressure chronic ulcer of left heel and midfoot limited to breakdown of skin: Secondary | ICD-10-CM | POA: Diagnosis not present

## 2015-05-25 DIAGNOSIS — E11621 Type 2 diabetes mellitus with foot ulcer: Secondary | ICD-10-CM | POA: Diagnosis not present

## 2015-05-28 DIAGNOSIS — Z79899 Other long term (current) drug therapy: Secondary | ICD-10-CM | POA: Diagnosis not present

## 2015-05-28 DIAGNOSIS — E11621 Type 2 diabetes mellitus with foot ulcer: Secondary | ICD-10-CM | POA: Diagnosis not present

## 2015-05-29 DIAGNOSIS — L97909 Non-pressure chronic ulcer of unspecified part of unspecified lower leg with unspecified severity: Secondary | ICD-10-CM | POA: Diagnosis not present

## 2015-05-29 DIAGNOSIS — E11621 Type 2 diabetes mellitus with foot ulcer: Secondary | ICD-10-CM | POA: Diagnosis not present

## 2015-05-29 DIAGNOSIS — L97529 Non-pressure chronic ulcer of other part of left foot with unspecified severity: Secondary | ICD-10-CM | POA: Diagnosis not present

## 2015-06-01 DIAGNOSIS — L97411 Non-pressure chronic ulcer of right heel and midfoot limited to breakdown of skin: Secondary | ICD-10-CM | POA: Diagnosis not present

## 2015-06-01 DIAGNOSIS — L97522 Non-pressure chronic ulcer of other part of left foot with fat layer exposed: Secondary | ICD-10-CM | POA: Diagnosis not present

## 2015-06-01 DIAGNOSIS — L97511 Non-pressure chronic ulcer of other part of right foot limited to breakdown of skin: Secondary | ICD-10-CM | POA: Diagnosis not present

## 2015-06-01 DIAGNOSIS — L97422 Non-pressure chronic ulcer of left heel and midfoot with fat layer exposed: Secondary | ICD-10-CM | POA: Diagnosis not present

## 2015-06-01 DIAGNOSIS — E11621 Type 2 diabetes mellitus with foot ulcer: Secondary | ICD-10-CM | POA: Diagnosis not present

## 2015-06-05 DIAGNOSIS — S91329A Laceration with foreign body, unspecified foot, initial encounter: Secondary | ICD-10-CM | POA: Diagnosis not present

## 2015-06-06 ENCOUNTER — Ambulatory Visit (INDEPENDENT_AMBULATORY_CARE_PROVIDER_SITE_OTHER): Payer: PPO | Admitting: Endocrinology

## 2015-06-06 ENCOUNTER — Encounter: Payer: Self-pay | Admitting: Endocrinology

## 2015-06-06 VITALS — BP 104/68 | HR 71 | Temp 97.5°F | Resp 16 | Ht 62.0 in | Wt 168.0 lb

## 2015-06-06 DIAGNOSIS — E1165 Type 2 diabetes mellitus with hyperglycemia: Secondary | ICD-10-CM | POA: Diagnosis not present

## 2015-06-06 DIAGNOSIS — D638 Anemia in other chronic diseases classified elsewhere: Secondary | ICD-10-CM

## 2015-06-06 DIAGNOSIS — E1139 Type 2 diabetes mellitus with other diabetic ophthalmic complication: Secondary | ICD-10-CM | POA: Diagnosis not present

## 2015-06-06 DIAGNOSIS — Z794 Long term (current) use of insulin: Secondary | ICD-10-CM | POA: Diagnosis not present

## 2015-06-06 DIAGNOSIS — IMO0002 Reserved for concepts with insufficient information to code with codable children: Secondary | ICD-10-CM

## 2015-06-06 LAB — CBC WITH DIFFERENTIAL/PLATELET
BASOS ABS: 0 10*3/uL (ref 0.0–0.1)
Basophils Relative: 0.6 % (ref 0.0–3.0)
EOS ABS: 0.5 10*3/uL (ref 0.0–0.7)
Eosinophils Relative: 6.1 % — ABNORMAL HIGH (ref 0.0–5.0)
HEMATOCRIT: 28.7 % — AB (ref 36.0–46.0)
HEMOGLOBIN: 9.1 g/dL — AB (ref 12.0–15.0)
LYMPHS PCT: 40.5 % (ref 12.0–46.0)
Lymphs Abs: 3 10*3/uL (ref 0.7–4.0)
MCHC: 31.8 g/dL (ref 30.0–36.0)
MCV: 78.4 fl (ref 78.0–100.0)
MONOS PCT: 5.6 % (ref 3.0–12.0)
Monocytes Absolute: 0.4 10*3/uL (ref 0.1–1.0)
Neutro Abs: 3.5 10*3/uL (ref 1.4–7.7)
Neutrophils Relative %: 47.2 % (ref 43.0–77.0)
PLATELETS: 528 10*3/uL — AB (ref 150.0–400.0)
RBC: 3.67 Mil/uL — AB (ref 3.87–5.11)
RDW: 15.6 % — ABNORMAL HIGH (ref 11.5–15.5)
WBC: 7.4 10*3/uL (ref 4.0–10.5)

## 2015-06-06 LAB — POCT GLYCOSYLATED HEMOGLOBIN (HGB A1C): HEMOGLOBIN A1C: 7.1

## 2015-06-06 NOTE — Progress Notes (Signed)
Patient ID: Dana [name], female   DOB: [birth year], 60 y.o.   MRN: [id number]           Reason for Appointment: Follow-up for Type 2 Diabetes  Referring physician: Elyn Aquas  History of Present Illness:          Date of diagnosis of type 2 diabetes mellitus: 2006       Background history:   She thinks her blood sugar was 300-400 at the time of diagnosis and she was started on insulin soon after this. She thinks she has been on metformin only for the last 5 years Her A1c history is available since only about 2013 and this had been consistently over 10%  Recent history:    She has has had  poor control of her diabetes and pre-consultation A1c of 13%  She had previously been on Levemir Because of her poor control and need for multiple injections and cost of brand name insulin she was started on the V-go pump after instruction by the nurse educator on 04/11/15 With this her blood sugars have been much improved  Her A1c is now 7.1%, considerably better than last year  INSULIN regimen is:  V-go 30 units pump, 3 clicks before meals, 9-0S daily  Current blood sugar patterns, daily management and problems identified:   She has been using the pump regularly recently until yesterday  She has had to waste 3-4 pumps last month because of them not sticking to her skin  She has been checking her blood sugars regularly but mostly in the mornings, usually does not eat breakfast until about 10-11 AM  Blood sugars are somewhat variable in the morning with sporadic high readings although the week of January 14 her blood sugars were below 140 consistently  She tends to have somewhat higher readings in the afternoon before her second meal usually  Blood sugars after supper at night or usually not being checked regularly but she checks some readings at bedtime which are variable.  She thinks some high readings are related to eating slacks like potato chips  She does not bolus according to her  carbohydrate intake or even call her snacks with boluses  She felt hypoglycemic once with a glucose of 88, probably did not eat as much at supper that evening                         Continues to gain weight  Non-insulin hypoglycemic drugs the patient is taking are: Metformin Side effects from medications have been: None  Compliance with the medical regimen: Improving Hypoglycemia: none   Glucose monitoring:  done 2 times a day         Glucometer:  FreeStyle .      Blood Glucose readings by time of day  Mean values apply above for all meters except median for One Touch  PRE-MEAL Fasting Lunch Dinner Bedtime Overall  Glucose range: 107-299 180 135- 241 88-200   Mean/median:     166+/-41   Self-care: The diet that the patient has been following is: none.     Typical meal intake: Breakfast is irregular otherwise may have toast and an egg or sausage.  Lunch is a sandwich or cheeseburger, evening meal is meat and 2 vegetables.  For snacks he will have peanut butter crackers     She has regular drinks, trying to reduce these now          Dietician visit, most  recent: Never               Exercise: none  Weight history: Stable over the last 3-4 years, was 400 pounds about 5 years ago  Wt Readings from Last 3 Encounters:  06/06/15 168 lb (76.204 kg)  04/23/15 163 lb 9.6 oz (74.208 kg)  03/16/15 158 lb 2 oz (71.725 kg)    Glycemic control:   Lab Results  Component Value Date   HGBA1C 7.1 06/06/2015   HGBA1C 13.0* 02/21/2015   HGBA1C 10.1* 11/08/2014   Lab Results  Component Value Date   MICROALBUR 4.0* 11/08/2014   LDLCALC 57 03/09/2015   CREATININE 0.73 03/16/2015         Medication List       This list is accurate as of: 06/06/15  3:36 PM.  Always use your most recent med list.               aspirin 81 MG tablet  Take 81 mg by mouth daily.     atorvastatin 20 MG tablet  Commonly known as:  LIPITOR  Take 1 tablet (20 mg total) by mouth at bedtime.      escitalopram 5 MG tablet  Commonly known as:  LEXAPRO  Take 7.5 mg by mouth every morning.     freestyle lancets  Use as instructed to check blood sugar 3 times per day dx code E11.39     FREESTYLE LITE Devi  Use to check blood sugar 3 times a day dx code E11.39     gabapentin 300 MG capsule  Commonly known as:  NEURONTIN  Take 600 mg each at breakfast and lunch. Take 900 mg at night.     glucose blood test strip  Commonly known as:  FREESTYLE LITE  Use as instructed to check blood sugar 3 times a day dx code E11.39     insulin regular 100 units/mL injection  Commonly known as:  NOVOLIN R RELION  Take 6 units with breakfast, 8 units with lunch and 8 units with dinner     metFORMIN 500 MG tablet  Commonly known as:  GLUCOPHAGE  Take 1000 mg each morning and 500 mg each evening     V-GO 30 Kit  Use one per day        Allergies:  Allergies  Allergen Reactions  . Ativan [Lorazepam] Other (See Comments)    Abnormal behavior  . Darvocet [Propoxyphene N-Acetaminophen] Itching  . Demerol [Meperidine] Other (See Comments)    Abnormal behavior  . Latex Itching  . Lithium Nausea And Vomiting    Can not keep this medication down. It makes her terribly ill.  . Oxycodone Other (See Comments)    Abnormal behavior    Past Medical History  Diagnosis Date  . Bipolar 1 disorder ([place])   . Hypertension     past hx of  . H/O hiatal hernia   . Numbness and tingling     Hx; of in B/LLE and B/LUE  . Hyperlipidemia   . Diabetes mellitus     INSULIN DEPENDENT  . Diabetic neuropathy ([place])   . Diabetic neuropathy ([place])   . Cellulitis and abscess of foot 03/08/2015    Past Surgical History  Procedure Laterality Date  . Breast surgery    . Abdominal hysterectomy    . Nasal septum surgery    . Amputation  10/24/2011    Procedure: AMPUTATION RAY;  Surgeon: Newt Minion, MD;  Location: [facility];  Service:  Orthopedics;  Laterality: Right;  Right Foot 3rd Ray Amputation  .  Tonsillectomy    . Adenoidectomy      Hx: of  . Amputation Right 12/16/2012    Procedure: Right Foot Transmetatarsal Amputation;  Surgeon: Newt Minion, MD;  Location: [facility];  Service: Orthopedics;  Laterality: Right;  . Bladder tact   2002  . Colon surgery    . Amputation Left 03/09/2015    Procedure: LEFT FOOT 1ST RAY AMPUTATION;  Surgeon: Newt Minion, MD;  Location: [facility];  Service: Orthopedics;  Laterality: Left;    Family History  Problem Relation Age of Onset  . Diabetes Mother   . Mental illness Mother   . Hypertension Father   . Diabetes Maternal Grandmother   . Heart disease Neg Hx     Social History:  reports that she has never smoked. She has never used smokeless tobacco. She reports that she does not drink alcohol or use illicit drugs.    Review of Systems    Lipid history: On treatment with Lipitor for about a year with the following results    Lab Results  Component Value Date   CHOL 103 03/09/2015   HDL 27* 03/09/2015   LDLCALC 57 03/09/2015   TRIG 96 03/09/2015   CHOLHDL 3.8 03/09/2015          Last diabetic foot exam was in 03/2015, she does have  sensory loss on the left toes     Physical Examination:  BP 104/68 mmHg  Pulse 71  Temp(Src) 97.5 F (36.4 C)  Resp 16  Ht '5\' 2"'  (1.575 m)  Wt 168 lb (76.204 kg)  BMI 30.72 kg/m2  SpO2 97%       ASSESSMENT:  Diabetes type 2, uncontrolled    She previously has had persistently poor control of her diabetes for several years and multiple complications. See history of present illness for detailed discussion of his current management, blood sugar patterns and problems identified  With using the V-go pump and 30 units basal and relatively small doses at mealtimes her blood sugars are overall better A1c is near 7% Blood sugars are somewhat variable and not consistently controlled overnight Most likely is not getting consistent coverage for her meals and snacks especially with more carbohydrate    Discussed need to cover all meals and snacks with adequate insulin based on meal size and carbohydrate intake Also she has somewhat inconsistent diet and occasional use of regular soft drinks also Discussed issues with her we-go pump not sticking consistently  PLAN:    She will use a sample of the V-go pump until next week  She will discuss ways to have the pump adhered to the skin better with nurse educator, discussed use of a antiperspirant spray before using the pump  She can continue to use the regular insulin in the pump  She will need to increase her bolus by 2 units at breakfast and with larger meals also at supper summer: Also occasionally if she is eating small amount of carbohydrate or smaller meals in the evening she will reduce to bolus to 4 units  She will try to bolus 15-30 minutes before eating  Continue metformin  More blood sugars after meals at various times about 2 hours later  Patient Instructions  4 clicks at bfst, average 3 at supper and 1 click for large snack  Check blood sugars on waking up 4  times a week Also check blood sugars about 2  hours after a meal and do this after different meals by rotation  Recommended blood sugar levels on waking up is 90-130 and about 2 hours after meal is 130-160  Please bring your blood sugar monitor to each visit, thank you       Counseling time on subjects discussed above is over 50% of today's 25 minute visit  Ruvi Fullenwider 06/06/2015, 3:36 PM   Note: This office note was prepared with Dragon voice recognition system technology. Any transcriptional errors that result from this process are unintentional.

## 2015-06-06 NOTE — Patient Instructions (Signed)
4 clicks at bfst, average 3 at supper and 1 click for large snack  Check blood sugars on waking up 4  times a week Also check blood sugars about 2 hours after a meal and do this after different meals by rotation  Recommended blood sugar levels on waking up is 90-130 and about 2 hours after meal is 130-160  Please bring your blood sugar monitor to each visit, thank you

## 2015-06-08 DIAGNOSIS — L97423 Non-pressure chronic ulcer of left heel and midfoot with necrosis of muscle: Secondary | ICD-10-CM | POA: Diagnosis not present

## 2015-06-08 DIAGNOSIS — L97419 Non-pressure chronic ulcer of right heel and midfoot with unspecified severity: Secondary | ICD-10-CM | POA: Diagnosis not present

## 2015-06-08 DIAGNOSIS — E11621 Type 2 diabetes mellitus with foot ulcer: Secondary | ICD-10-CM | POA: Diagnosis not present

## 2015-06-08 DIAGNOSIS — L97413 Non-pressure chronic ulcer of right heel and midfoot with necrosis of muscle: Secondary | ICD-10-CM | POA: Diagnosis not present

## 2015-06-08 DIAGNOSIS — L97429 Non-pressure chronic ulcer of left heel and midfoot with unspecified severity: Secondary | ICD-10-CM | POA: Diagnosis not present

## 2015-06-15 DIAGNOSIS — Z01818 Encounter for other preprocedural examination: Secondary | ICD-10-CM | POA: Diagnosis not present

## 2015-06-15 DIAGNOSIS — L97422 Non-pressure chronic ulcer of left heel and midfoot with fat layer exposed: Secondary | ICD-10-CM | POA: Diagnosis not present

## 2015-06-15 DIAGNOSIS — I4510 Unspecified right bundle-branch block: Secondary | ICD-10-CM | POA: Diagnosis not present

## 2015-06-15 DIAGNOSIS — L97423 Non-pressure chronic ulcer of left heel and midfoot with necrosis of muscle: Secondary | ICD-10-CM | POA: Diagnosis not present

## 2015-06-15 DIAGNOSIS — E11621 Type 2 diabetes mellitus with foot ulcer: Secondary | ICD-10-CM | POA: Diagnosis not present

## 2015-06-15 DIAGNOSIS — L97412 Non-pressure chronic ulcer of right heel and midfoot with fat layer exposed: Secondary | ICD-10-CM | POA: Diagnosis not present

## 2015-06-15 DIAGNOSIS — S91329A Laceration with foreign body, unspecified foot, initial encounter: Secondary | ICD-10-CM | POA: Diagnosis not present

## 2015-06-15 DIAGNOSIS — L97413 Non-pressure chronic ulcer of right heel and midfoot with necrosis of muscle: Secondary | ICD-10-CM | POA: Diagnosis not present

## 2015-06-22 DIAGNOSIS — L97419 Non-pressure chronic ulcer of right heel and midfoot with unspecified severity: Secondary | ICD-10-CM | POA: Diagnosis not present

## 2015-06-22 DIAGNOSIS — L97513 Non-pressure chronic ulcer of other part of right foot with necrosis of muscle: Secondary | ICD-10-CM | POA: Diagnosis not present

## 2015-06-22 DIAGNOSIS — E11621 Type 2 diabetes mellitus with foot ulcer: Secondary | ICD-10-CM | POA: Diagnosis not present

## 2015-06-22 DIAGNOSIS — S91329A Laceration with foreign body, unspecified foot, initial encounter: Secondary | ICD-10-CM | POA: Diagnosis not present

## 2015-06-22 DIAGNOSIS — L97429 Non-pressure chronic ulcer of left heel and midfoot with unspecified severity: Secondary | ICD-10-CM | POA: Diagnosis not present

## 2015-06-22 DIAGNOSIS — M86272 Subacute osteomyelitis, left ankle and foot: Secondary | ICD-10-CM | POA: Diagnosis not present

## 2015-06-28 DIAGNOSIS — E113393 Type 2 diabetes mellitus with moderate nonproliferative diabetic retinopathy without macular edema, bilateral: Secondary | ICD-10-CM | POA: Diagnosis not present

## 2015-06-29 DIAGNOSIS — M86272 Subacute osteomyelitis, left ankle and foot: Secondary | ICD-10-CM | POA: Diagnosis not present

## 2015-06-29 DIAGNOSIS — L97513 Non-pressure chronic ulcer of other part of right foot with necrosis of muscle: Secondary | ICD-10-CM | POA: Diagnosis not present

## 2015-06-29 DIAGNOSIS — L97421 Non-pressure chronic ulcer of left heel and midfoot limited to breakdown of skin: Secondary | ICD-10-CM | POA: Diagnosis not present

## 2015-06-29 DIAGNOSIS — I081 Rheumatic disorders of both mitral and tricuspid valves: Secondary | ICD-10-CM | POA: Diagnosis not present

## 2015-06-29 DIAGNOSIS — R9431 Abnormal electrocardiogram [ECG] [EKG]: Secondary | ICD-10-CM | POA: Diagnosis not present

## 2015-06-29 DIAGNOSIS — L97412 Non-pressure chronic ulcer of right heel and midfoot with fat layer exposed: Secondary | ICD-10-CM | POA: Diagnosis not present

## 2015-06-29 DIAGNOSIS — E11621 Type 2 diabetes mellitus with foot ulcer: Secondary | ICD-10-CM | POA: Diagnosis not present

## 2015-07-02 LAB — HM DIABETES EYE EXAM

## 2015-07-04 DIAGNOSIS — S91329A Laceration with foreign body, unspecified foot, initial encounter: Secondary | ICD-10-CM | POA: Diagnosis not present

## 2015-07-06 DIAGNOSIS — M86272 Subacute osteomyelitis, left ankle and foot: Secondary | ICD-10-CM | POA: Diagnosis not present

## 2015-07-06 DIAGNOSIS — L97419 Non-pressure chronic ulcer of right heel and midfoot with unspecified severity: Secondary | ICD-10-CM | POA: Diagnosis not present

## 2015-07-06 DIAGNOSIS — L97429 Non-pressure chronic ulcer of left heel and midfoot with unspecified severity: Secondary | ICD-10-CM | POA: Diagnosis not present

## 2015-07-06 DIAGNOSIS — E11621 Type 2 diabetes mellitus with foot ulcer: Secondary | ICD-10-CM | POA: Diagnosis not present

## 2015-07-06 DIAGNOSIS — L97522 Non-pressure chronic ulcer of other part of left foot with fat layer exposed: Secondary | ICD-10-CM | POA: Diagnosis not present

## 2015-07-06 DIAGNOSIS — L97512 Non-pressure chronic ulcer of other part of right foot with fat layer exposed: Secondary | ICD-10-CM | POA: Diagnosis not present

## 2015-07-10 DIAGNOSIS — M86272 Subacute osteomyelitis, left ankle and foot: Secondary | ICD-10-CM | POA: Diagnosis not present

## 2015-07-10 DIAGNOSIS — E11621 Type 2 diabetes mellitus with foot ulcer: Secondary | ICD-10-CM | POA: Diagnosis not present

## 2015-07-10 DIAGNOSIS — L97513 Non-pressure chronic ulcer of other part of right foot with necrosis of muscle: Secondary | ICD-10-CM | POA: Diagnosis not present

## 2015-07-10 DIAGNOSIS — M86672 Other chronic osteomyelitis, left ankle and foot: Secondary | ICD-10-CM | POA: Diagnosis not present

## 2015-07-11 DIAGNOSIS — M86672 Other chronic osteomyelitis, left ankle and foot: Secondary | ICD-10-CM | POA: Diagnosis not present

## 2015-07-11 DIAGNOSIS — M86272 Subacute osteomyelitis, left ankle and foot: Secondary | ICD-10-CM | POA: Diagnosis not present

## 2015-07-11 DIAGNOSIS — E11621 Type 2 diabetes mellitus with foot ulcer: Secondary | ICD-10-CM | POA: Diagnosis not present

## 2015-07-11 DIAGNOSIS — L97513 Non-pressure chronic ulcer of other part of right foot with necrosis of muscle: Secondary | ICD-10-CM | POA: Diagnosis not present

## 2015-07-12 DIAGNOSIS — M86672 Other chronic osteomyelitis, left ankle and foot: Secondary | ICD-10-CM | POA: Diagnosis not present

## 2015-07-12 DIAGNOSIS — M86272 Subacute osteomyelitis, left ankle and foot: Secondary | ICD-10-CM | POA: Diagnosis not present

## 2015-07-12 DIAGNOSIS — E11621 Type 2 diabetes mellitus with foot ulcer: Secondary | ICD-10-CM | POA: Diagnosis not present

## 2015-07-17 ENCOUNTER — Encounter: Payer: Self-pay | Admitting: Physician Assistant

## 2015-07-17 ENCOUNTER — Ambulatory Visit (INDEPENDENT_AMBULATORY_CARE_PROVIDER_SITE_OTHER): Payer: PPO | Admitting: Physician Assistant

## 2015-07-17 VITALS — BP 110/60 | HR 73 | Temp 97.8°F | Ht 62.0 in | Wt 171.0 lb

## 2015-07-17 DIAGNOSIS — M8668 Other chronic osteomyelitis, other site: Secondary | ICD-10-CM | POA: Diagnosis not present

## 2015-07-17 DIAGNOSIS — D649 Anemia, unspecified: Secondary | ICD-10-CM

## 2015-07-17 DIAGNOSIS — Z794 Long term (current) use of insulin: Secondary | ICD-10-CM | POA: Diagnosis not present

## 2015-07-17 DIAGNOSIS — E1165 Type 2 diabetes mellitus with hyperglycemia: Secondary | ICD-10-CM | POA: Diagnosis not present

## 2015-07-17 DIAGNOSIS — E11319 Type 2 diabetes mellitus with unspecified diabetic retinopathy without macular edema: Secondary | ICD-10-CM | POA: Diagnosis not present

## 2015-07-17 DIAGNOSIS — S91329A Laceration with foreign body, unspecified foot, initial encounter: Secondary | ICD-10-CM | POA: Diagnosis not present

## 2015-07-17 DIAGNOSIS — M86272 Subacute osteomyelitis, left ankle and foot: Secondary | ICD-10-CM | POA: Diagnosis not present

## 2015-07-17 DIAGNOSIS — E11621 Type 2 diabetes mellitus with foot ulcer: Secondary | ICD-10-CM | POA: Diagnosis not present

## 2015-07-17 DIAGNOSIS — M86672 Other chronic osteomyelitis, left ankle and foot: Secondary | ICD-10-CM | POA: Diagnosis not present

## 2015-07-17 DIAGNOSIS — L97521 Non-pressure chronic ulcer of other part of left foot limited to breakdown of skin: Secondary | ICD-10-CM | POA: Diagnosis not present

## 2015-07-17 LAB — CBC WITH DIFFERENTIAL/PLATELET
Basophils Absolute: 0 10*3/uL (ref 0.0–0.1)
Basophils Relative: 0.5 % (ref 0.0–3.0)
EOS PCT: 7.6 % — AB (ref 0.0–5.0)
Eosinophils Absolute: 0.5 10*3/uL (ref 0.0–0.7)
HCT: 27.1 % — ABNORMAL LOW (ref 36.0–46.0)
Hemoglobin: 8.8 g/dL — ABNORMAL LOW (ref 12.0–15.0)
LYMPHS ABS: 2.2 10*3/uL (ref 0.7–4.0)
Lymphocytes Relative: 31.3 % (ref 12.0–46.0)
MCHC: 32.3 g/dL (ref 30.0–36.0)
MCV: 75.1 fl — AB (ref 78.0–100.0)
MONOS PCT: 6.3 % (ref 3.0–12.0)
Monocytes Absolute: 0.4 10*3/uL (ref 0.1–1.0)
NEUTROS PCT: 54.3 % (ref 43.0–77.0)
Neutro Abs: 3.8 10*3/uL (ref 1.4–7.7)
Platelets: 462 10*3/uL — ABNORMAL HIGH (ref 150.0–400.0)
RBC: 3.62 Mil/uL — AB (ref 3.87–5.11)
RDW: 16.5 % — ABNORMAL HIGH (ref 11.5–15.5)
WBC: 7.1 10*3/uL (ref 4.0–10.5)

## 2015-07-17 LAB — FERRITIN: Ferritin: 21.2 ng/mL (ref 10.0–291.0)

## 2015-07-17 MED ORDER — GABAPENTIN 300 MG PO CAPS: ORAL_CAPSULE | ORAL | Status: DC

## 2015-07-17 NOTE — Progress Notes (Signed)
Patient presents to clinic today c/o worsening neuropathy and phantom-limb pain over the past couple of weeks. Is taking insulin as directed and has scheduled follow-up with Endocrinology. Has been taking Gabapentin as directed which helps some but is now sub-therapeutic.  Patient also seen by psychiatry yesterday who felt she looked pale and recommended CBC. Patient with history of anemia. Denies rectal bleeding. Denies SOB, palpitations, LH or dizziness.  Past Medical History  Diagnosis Date  . Bipolar 1 disorder ([place])   . Hypertension     past hx of  . H/O hiatal hernia   . Numbness and tingling     Hx; of in B/LLE and B/LUE  . Hyperlipidemia   . Diabetes mellitus     INSULIN DEPENDENT  . Diabetic neuropathy ([place])   . Diabetic neuropathy ([place])   . Cellulitis and abscess of foot 03/08/2015    Current Outpatient Prescriptions on File Prior to Visit  Medication Sig Dispense Refill  . aspirin 81 MG tablet Take 81 mg by mouth daily.    Marland Kitchen atorvastatin (LIPITOR) 20 MG tablet Take 1 tablet (20 mg total) by mouth at bedtime. 30 tablet 5  . Blood Glucose Monitoring Suppl (FREESTYLE LITE) DEVI Use to check blood sugar 3 times a day dx code E11.39 1 each 0  . escitalopram (LEXAPRO) 5 MG tablet Take 7.5 mg by mouth every morning.     Marland Kitchen glucose blood (FREESTYLE LITE) test strip Use as instructed to check blood sugar 3 times a day dx code E11.39 100 each 3  . Insulin Disposable Pump (V-GO 30) KIT Use one per day 1 kit 2  . insulin regular (NOVOLIN R RELION) 100 units/mL injection Take 6 units with breakfast, 8 units with lunch and 8 units with dinner (Patient taking differently: Take 6 units with breakfast, 8 units with lunch and 8 units with dinner (takes 68 units with V-go per day)) 10 mL 3  . Lancets (FREESTYLE) lancets Use as instructed to check blood sugar 3 times per day dx code E11.39 100 each 3  . metFORMIN (GLUCOPHAGE) 500 MG tablet Take 1000 mg each morning and 500 mg each evening  (Patient taking differently: Take 500-1,000 mg by mouth 2 (two) times daily with a meal. Take 2 tablets in the morning Take 1 tablet in the evening) 90 tablet 5   No current facility-administered medications on file prior to visit.    Allergies  Allergen Reactions  . Ativan [Lorazepam] Other (See Comments)    Abnormal behavior  . Darvocet [Propoxyphene N-Acetaminophen] Itching  . Demerol [Meperidine] Other (See Comments)    Abnormal behavior  . Latex Itching  . Lithium Nausea And Vomiting    Can not keep this medication down. It makes her terribly ill.  . Oxycodone Other (See Comments)    Abnormal behavior    Family History  Problem Relation Age of Onset  . Diabetes Mother   . Mental illness Mother   . Hypertension Father   . Diabetes Maternal Grandmother   . Heart disease Neg Hx     Social History   Social History  . Marital Status: Married    Spouse Name: N/A  . Number of Children: N/A  . Years of Education: N/A   Social History Main Topics  . Smoking status: Never Smoker   . Smokeless tobacco: Never Used  . Alcohol Use: No  . Drug Use: No  . Sexual Activity: Not Asked   Other Topics Concern  .  None   Social History Narrative   Review of Systems - See HPI.  All other ROS are negative.  BP 110/60 mmHg  Pulse 73  Temp(Src) 97.8 F (36.6 C) (Oral)  Ht _0  (1.575 m)  Wt 171 lb (77.565 kg)  BMI 31.27 kg/m2  SpO2 98%  Physical Exam  Constitutional: She is well-developed, well-nourished, and in no distress.  HENT:  Head: Normocephalic and atraumatic.  Eyes: Conjunctivae and EOM are normal. Pupils are equal, round, and reactive to light.  Cardiovascular: Normal rate, regular rhythm, normal heart sounds and intact distal pulses.   Pulmonary/Chest: Effort normal and breath sounds normal. No respiratory distress. She has no wheezes. She has no rales. She exhibits no tenderness.  Neurological: She is alert.  Skin: Skin is warm and dry.  Vitals  reviewed.   Recent Results (from the past 2160 hour(s))  POCT HgB A1C     Status: Abnormal   Collection Time: 06/06/15  2:03 PM  Result Value Ref Range   Hemoglobin A1C 7.1   CBC with Differential/Platelet     Status: Abnormal   Collection Time: 06/06/15  2:21 PM  Result Value Ref Range   WBC 7.4 4.0 - 10.5 K/uL   RBC 3.67 (L) 3.87 - 5.11 Mil/uL   Hemoglobin 9.1 (L) 12.0 - 15.0 g/dL   HCT 28.7 (L) 36.0 - 46.0 %   MCV 78.4 78.0 - 100.0 fl   MCHC 31.8 30.0 - 36.0 g/dL   RDW 15.6 (H) 11.5 - 15.5 %   Platelets 528.0 (H) 150.0 - 400.0 K/uL   Neutrophils Relative % 47.2 43.0 - 77.0 %   Lymphocytes Relative 40.5 12.0 - 46.0 %   Monocytes Relative 5.6 3.0 - 12.0 %   Eosinophils Relative 6.1 (H) 0.0 - 5.0 %   Basophils Relative 0.6 0.0 - 3.0 %   Neutro Abs 3.5 1.4 - 7.7 K/uL   Lymphs Abs 3.0 0.7 - 4.0 K/uL   Monocytes Absolute 0.4 0.1 - 1.0 K/uL   Eosinophils Absolute 0.5 0.0 - 0.7 K/uL   Basophils Absolute 0.0 0.0 - 0.1 K/uL  HM DIABETES EYE EXAM     Status: Abnormal   Collection Time: 07/02/15 12:00 AM  Result Value Ref Range   HM Diabetic Eye Exam Retinopathy (A) No Retinopathy    Assessment/Plan: Anemia BP stable. Asymptomatic. Will check CBC and anemia panel today.  DM (diabetes mellitus) type II uncontrolled with eye manifestation (HCC) Worsening neuropathy. Will increase Gabapentin to 900 mg AM, noon and 1200 mg PM. Recommend we add on Cymbalta to help if symptoms are not improving.

## 2015-07-17 NOTE — Assessment & Plan Note (Signed)
Worsening neuropathy. Will increase Gabapentin to 900 mg AM, noon and 1200 mg PM. Recommend we add on Cymbalta to help if symptoms are not improving.

## 2015-07-17 NOTE — Progress Notes (Signed)
Pre visit review using our clinic review tool, if applicable. No additional management support is needed unless otherwise documented below in the visit note. 

## 2015-07-17 NOTE — Patient Instructions (Signed)
Please start the new dose of the Gabapentin. Stop the Ibuprofen and use Tylenol if needed for pain.  Go to the lab for blood work. I will call you with your results. Follow-up will be based on results.

## 2015-07-17 NOTE — Assessment & Plan Note (Signed)
BP stable. Asymptomatic. Will check CBC and anemia panel today.

## 2015-07-18 DIAGNOSIS — L97421 Non-pressure chronic ulcer of left heel and midfoot limited to breakdown of skin: Secondary | ICD-10-CM | POA: Diagnosis not present

## 2015-07-18 DIAGNOSIS — L97522 Non-pressure chronic ulcer of other part of left foot with fat layer exposed: Secondary | ICD-10-CM | POA: Diagnosis not present

## 2015-07-18 DIAGNOSIS — M8668 Other chronic osteomyelitis, other site: Secondary | ICD-10-CM | POA: Diagnosis not present

## 2015-07-18 DIAGNOSIS — L97411 Non-pressure chronic ulcer of right heel and midfoot limited to breakdown of skin: Secondary | ICD-10-CM | POA: Diagnosis not present

## 2015-07-18 DIAGNOSIS — E11621 Type 2 diabetes mellitus with foot ulcer: Secondary | ICD-10-CM | POA: Diagnosis not present

## 2015-07-18 DIAGNOSIS — L97511 Non-pressure chronic ulcer of other part of right foot limited to breakdown of skin: Secondary | ICD-10-CM | POA: Diagnosis not present

## 2015-07-18 DIAGNOSIS — M86672 Other chronic osteomyelitis, left ankle and foot: Secondary | ICD-10-CM | POA: Diagnosis not present

## 2015-07-18 DIAGNOSIS — M86272 Subacute osteomyelitis, left ankle and foot: Secondary | ICD-10-CM | POA: Diagnosis not present

## 2015-07-18 LAB — IRON AND TIBC
%SAT: 12 % (ref 11–50)
Iron: 38 ug/dL — ABNORMAL LOW (ref 45–160)
TIBC: 324 ug/dL (ref 250–450)
UIBC: 286 ug/dL (ref 125–400)

## 2015-07-19 DIAGNOSIS — M86272 Subacute osteomyelitis, left ankle and foot: Secondary | ICD-10-CM | POA: Diagnosis not present

## 2015-07-19 DIAGNOSIS — E11621 Type 2 diabetes mellitus with foot ulcer: Secondary | ICD-10-CM | POA: Diagnosis not present

## 2015-07-19 DIAGNOSIS — L97522 Non-pressure chronic ulcer of other part of left foot with fat layer exposed: Secondary | ICD-10-CM | POA: Diagnosis not present

## 2015-07-19 DIAGNOSIS — M8668 Other chronic osteomyelitis, other site: Secondary | ICD-10-CM | POA: Diagnosis not present

## 2015-07-19 DIAGNOSIS — M86672 Other chronic osteomyelitis, left ankle and foot: Secondary | ICD-10-CM | POA: Diagnosis not present

## 2015-07-20 ENCOUNTER — Telehealth: Payer: Self-pay | Admitting: Physician Assistant

## 2015-07-20 DIAGNOSIS — E11621 Type 2 diabetes mellitus with foot ulcer: Secondary | ICD-10-CM | POA: Diagnosis not present

## 2015-07-20 DIAGNOSIS — M86272 Subacute osteomyelitis, left ankle and foot: Secondary | ICD-10-CM | POA: Diagnosis not present

## 2015-07-20 DIAGNOSIS — M86672 Other chronic osteomyelitis, left ankle and foot: Secondary | ICD-10-CM | POA: Diagnosis not present

## 2015-07-20 DIAGNOSIS — M8668 Other chronic osteomyelitis, other site: Secondary | ICD-10-CM | POA: Diagnosis not present

## 2015-07-20 DIAGNOSIS — L97522 Non-pressure chronic ulcer of other part of left foot with fat layer exposed: Secondary | ICD-10-CM | POA: Diagnosis not present

## 2015-07-20 NOTE — Telephone Encounter (Signed)
Call returned and lab results given to the patient.

## 2015-07-20 NOTE — Telephone Encounter (Signed)
Relation to PO:[id number] Call back number:[phone] Pharmacy:  Reason for call:  Patient returning your call regarding lab results

## 2015-07-23 DIAGNOSIS — E11621 Type 2 diabetes mellitus with foot ulcer: Secondary | ICD-10-CM | POA: Diagnosis not present

## 2015-07-23 DIAGNOSIS — M86672 Other chronic osteomyelitis, left ankle and foot: Secondary | ICD-10-CM | POA: Diagnosis not present

## 2015-07-23 DIAGNOSIS — M8668 Other chronic osteomyelitis, other site: Secondary | ICD-10-CM | POA: Diagnosis not present

## 2015-07-23 DIAGNOSIS — M86272 Subacute osteomyelitis, left ankle and foot: Secondary | ICD-10-CM | POA: Diagnosis not present

## 2015-07-23 DIAGNOSIS — L97522 Non-pressure chronic ulcer of other part of left foot with fat layer exposed: Secondary | ICD-10-CM | POA: Diagnosis not present

## 2015-07-24 DIAGNOSIS — M86272 Subacute osteomyelitis, left ankle and foot: Secondary | ICD-10-CM | POA: Diagnosis not present

## 2015-07-24 DIAGNOSIS — L97522 Non-pressure chronic ulcer of other part of left foot with fat layer exposed: Secondary | ICD-10-CM | POA: Diagnosis not present

## 2015-07-24 DIAGNOSIS — M86672 Other chronic osteomyelitis, left ankle and foot: Secondary | ICD-10-CM | POA: Diagnosis not present

## 2015-07-24 DIAGNOSIS — M8668 Other chronic osteomyelitis, other site: Secondary | ICD-10-CM | POA: Diagnosis not present

## 2015-07-24 DIAGNOSIS — E11621 Type 2 diabetes mellitus with foot ulcer: Secondary | ICD-10-CM | POA: Diagnosis not present

## 2015-07-25 ENCOUNTER — Telehealth: Payer: Self-pay | Admitting: *Deleted

## 2015-07-25 DIAGNOSIS — D638 Anemia in other chronic diseases classified elsewhere: Secondary | ICD-10-CM

## 2015-07-25 DIAGNOSIS — M8668 Other chronic osteomyelitis, other site: Secondary | ICD-10-CM | POA: Diagnosis not present

## 2015-07-25 DIAGNOSIS — E11621 Type 2 diabetes mellitus with foot ulcer: Secondary | ICD-10-CM | POA: Diagnosis not present

## 2015-07-25 DIAGNOSIS — M86272 Subacute osteomyelitis, left ankle and foot: Secondary | ICD-10-CM | POA: Diagnosis not present

## 2015-07-25 DIAGNOSIS — L97522 Non-pressure chronic ulcer of other part of left foot with fat layer exposed: Secondary | ICD-10-CM | POA: Diagnosis not present

## 2015-07-25 DIAGNOSIS — M86672 Other chronic osteomyelitis, left ankle and foot: Secondary | ICD-10-CM | POA: Diagnosis not present

## 2015-07-25 NOTE — Telephone Encounter (Signed)
Called and spoke with the pt and informed her of the note below that Einar Pheasant would recommend the referral to hematology.  Pt verbalized understanding and agreed.  Referral placed and sent.//AB/CMA

## 2015-07-25 NOTE — Telephone Encounter (Addendum)
Notes Recorded by Brunetta Jeans, PA-C on 07/24/2015 at 8:51 AM I would recommend. Ok to place referral to hematology for anemia of chronic disease.   Informed patient of the below results. She understood. Also, patient voiced that she has not seen a hematologist in the past for chronic anemia, however if PCP recommends it she will go. Patient did not have any questions or concerns prior to end of call.   Anemia is relatively stable. This fits anemia of chronic disease (diabetes being a big culprit). Has she ever seen Hematology before to discuss her chronic anemia?

## 2015-07-26 DIAGNOSIS — L97422 Non-pressure chronic ulcer of left heel and midfoot with fat layer exposed: Secondary | ICD-10-CM | POA: Diagnosis not present

## 2015-07-26 DIAGNOSIS — L97522 Non-pressure chronic ulcer of other part of left foot with fat layer exposed: Secondary | ICD-10-CM | POA: Diagnosis not present

## 2015-07-26 DIAGNOSIS — M8668 Other chronic osteomyelitis, other site: Secondary | ICD-10-CM | POA: Diagnosis not present

## 2015-07-26 DIAGNOSIS — L97412 Non-pressure chronic ulcer of right heel and midfoot with fat layer exposed: Secondary | ICD-10-CM | POA: Diagnosis not present

## 2015-07-26 DIAGNOSIS — L97513 Non-pressure chronic ulcer of other part of right foot with necrosis of muscle: Secondary | ICD-10-CM | POA: Diagnosis not present

## 2015-07-26 DIAGNOSIS — S91329A Laceration with foreign body, unspecified foot, initial encounter: Secondary | ICD-10-CM | POA: Diagnosis not present

## 2015-07-26 DIAGNOSIS — M86672 Other chronic osteomyelitis, left ankle and foot: Secondary | ICD-10-CM | POA: Diagnosis not present

## 2015-07-26 DIAGNOSIS — M86272 Subacute osteomyelitis, left ankle and foot: Secondary | ICD-10-CM | POA: Diagnosis not present

## 2015-07-26 DIAGNOSIS — E11621 Type 2 diabetes mellitus with foot ulcer: Secondary | ICD-10-CM | POA: Diagnosis not present

## 2015-07-27 DIAGNOSIS — E11621 Type 2 diabetes mellitus with foot ulcer: Secondary | ICD-10-CM | POA: Diagnosis not present

## 2015-07-27 DIAGNOSIS — M8668 Other chronic osteomyelitis, other site: Secondary | ICD-10-CM | POA: Diagnosis not present

## 2015-07-27 DIAGNOSIS — M86672 Other chronic osteomyelitis, left ankle and foot: Secondary | ICD-10-CM | POA: Diagnosis not present

## 2015-07-27 DIAGNOSIS — M86272 Subacute osteomyelitis, left ankle and foot: Secondary | ICD-10-CM | POA: Diagnosis not present

## 2015-07-27 DIAGNOSIS — L97523 Non-pressure chronic ulcer of other part of left foot with necrosis of muscle: Secondary | ICD-10-CM | POA: Diagnosis not present

## 2015-07-30 DIAGNOSIS — M86672 Other chronic osteomyelitis, left ankle and foot: Secondary | ICD-10-CM | POA: Diagnosis not present

## 2015-07-30 DIAGNOSIS — M8668 Other chronic osteomyelitis, other site: Secondary | ICD-10-CM | POA: Diagnosis not present

## 2015-07-30 DIAGNOSIS — E11621 Type 2 diabetes mellitus with foot ulcer: Secondary | ICD-10-CM | POA: Diagnosis not present

## 2015-07-31 ENCOUNTER — Telehealth: Payer: Self-pay | Admitting: Physician Assistant

## 2015-07-31 DIAGNOSIS — M86672 Other chronic osteomyelitis, left ankle and foot: Secondary | ICD-10-CM | POA: Diagnosis not present

## 2015-07-31 DIAGNOSIS — M8668 Other chronic osteomyelitis, other site: Secondary | ICD-10-CM | POA: Diagnosis not present

## 2015-07-31 DIAGNOSIS — E11621 Type 2 diabetes mellitus with foot ulcer: Secondary | ICD-10-CM | POA: Diagnosis not present

## 2015-07-31 MED ORDER — DULOXETINE HCL 30 MG PO CPEP: 30.0000 mg | ORAL_CAPSULE | Freq: Every day | ORAL | Status: DC

## 2015-07-31 NOTE — Telephone Encounter (Signed)
Medicine sent in. 1 tablet by mouth daily. She needs to schedule follow-up with me in 1 month.

## 2015-07-31 NOTE — Telephone Encounter (Signed)
Spoke with pt she is ok with the Cymbalta being added in and would like it filled to [facility].

## 2015-07-31 NOTE — Telephone Encounter (Signed)
Caller name: Self  Can be reached: [phone]   Reason for call: States that Tylenol is not working for the pain in her feet and she would like to know if there is something else she can take.

## 2015-07-31 NOTE — Addendum Note (Signed)
Addended by: [name] on: 07/31/2015 12:06 PM   Modules accepted: Orders

## 2015-07-31 NOTE — Telephone Encounter (Signed)
At last visit recommended addition of Cymbalta for chronic pain. Is she ok with [place] adding?

## 2015-07-31 NOTE — Telephone Encounter (Signed)
Called pt and LMOVM to return call.  °

## 2015-07-31 NOTE — Telephone Encounter (Signed)
Called and [facility] @ 5:29pm @ [phone]) asking the pt to RTC regarding note below.//AB/CMA

## 2015-08-01 DIAGNOSIS — S91329A Laceration with foreign body, unspecified foot, initial encounter: Secondary | ICD-10-CM | POA: Diagnosis not present

## 2015-08-02 ENCOUNTER — Other Ambulatory Visit: Payer: PPO

## 2015-08-02 ENCOUNTER — Ambulatory Visit: Payer: PPO

## 2015-08-02 ENCOUNTER — Ambulatory Visit: Payer: PPO | Admitting: Hematology & Oncology

## 2015-08-02 DIAGNOSIS — M8668 Other chronic osteomyelitis, other site: Secondary | ICD-10-CM | POA: Diagnosis not present

## 2015-08-02 DIAGNOSIS — E11621 Type 2 diabetes mellitus with foot ulcer: Secondary | ICD-10-CM | POA: Diagnosis not present

## 2015-08-02 DIAGNOSIS — M86672 Other chronic osteomyelitis, left ankle and foot: Secondary | ICD-10-CM | POA: Diagnosis not present

## 2015-08-03 DIAGNOSIS — L97412 Non-pressure chronic ulcer of right heel and midfoot with fat layer exposed: Secondary | ICD-10-CM | POA: Diagnosis not present

## 2015-08-03 DIAGNOSIS — L97521 Non-pressure chronic ulcer of other part of left foot limited to breakdown of skin: Secondary | ICD-10-CM | POA: Diagnosis not present

## 2015-08-03 DIAGNOSIS — M8668 Other chronic osteomyelitis, other site: Secondary | ICD-10-CM | POA: Diagnosis not present

## 2015-08-03 DIAGNOSIS — L97513 Non-pressure chronic ulcer of other part of right foot with necrosis of muscle: Secondary | ICD-10-CM | POA: Diagnosis not present

## 2015-08-03 DIAGNOSIS — M86672 Other chronic osteomyelitis, left ankle and foot: Secondary | ICD-10-CM | POA: Diagnosis not present

## 2015-08-03 DIAGNOSIS — L97422 Non-pressure chronic ulcer of left heel and midfoot with fat layer exposed: Secondary | ICD-10-CM | POA: Diagnosis not present

## 2015-08-03 DIAGNOSIS — E11621 Type 2 diabetes mellitus with foot ulcer: Secondary | ICD-10-CM | POA: Diagnosis not present

## 2015-08-03 NOTE — Telephone Encounter (Signed)
Pt states that the cymbalta made her agitated, she said she had pain from her foot up her leg, hot from the waist up and cold from the waist down. She asked if there is an alternative med for pain in her foot.  Best contact # [phone]

## 2015-08-03 NOTE — Telephone Encounter (Addendum)
Verbally informed Einar Pheasant of the note below.  Per provider have the pt to stop the Cymbalta and he will give her a prescription for Norco 5/325mg .  He will like for the pt to follow-up him.   Called and [facility] @ 9:44am @ [phone]) asking the pt to RTC regarding note.//AB/CMA

## 2015-08-06 DIAGNOSIS — M86672 Other chronic osteomyelitis, left ankle and foot: Secondary | ICD-10-CM | POA: Diagnosis not present

## 2015-08-06 DIAGNOSIS — M8668 Other chronic osteomyelitis, other site: Secondary | ICD-10-CM | POA: Diagnosis not present

## 2015-08-06 MED ORDER — HYDROCODONE-ACETAMINOPHEN 5-325 MG PO TABS: 1.0000 | ORAL_TABLET | Freq: Three times a day (TID) | ORAL | Status: DC | PRN

## 2015-08-06 NOTE — Telephone Encounter (Signed)
Pt says that she would like to be sure that she isn't allergic to the new medication that PCP is prescribing. Pt is requesting a call back to discuss further.   CB: [phone]

## 2015-08-06 NOTE — Addendum Note (Signed)
Addended by: Harl Bowie on: 08/06/2015 07:35 AM   Modules accepted: Orders

## 2015-08-06 NOTE — Telephone Encounter (Signed)
Called and [facility] @ 3:32pm @ [phone]) asking the pt to RTC regarding note below.//AB/CMA

## 2015-08-06 NOTE — Telephone Encounter (Signed)
Called and spoke with the pt and informed her of the note below.  Pt verbalized understanding and agreed to try the [place].  Informed the pt that she will need to pick up the prescription here at the office and also need to schedule an follow-up appt.  She agreed.  New prescription printed and placed up front for the pt to pick up.//AB/CMA

## 2015-08-07 DIAGNOSIS — M86672 Other chronic osteomyelitis, left ankle and foot: Secondary | ICD-10-CM | POA: Diagnosis not present

## 2015-08-07 DIAGNOSIS — M8668 Other chronic osteomyelitis, other site: Secondary | ICD-10-CM | POA: Diagnosis not present

## 2015-08-07 NOTE — Telephone Encounter (Signed)
Called and [facility] @ 9:07am @ [phone]) asking the pt to RTC regarding medication.//AB/CMA

## 2015-08-08 DIAGNOSIS — M86672 Other chronic osteomyelitis, left ankle and foot: Secondary | ICD-10-CM | POA: Diagnosis not present

## 2015-08-08 NOTE — Telephone Encounter (Signed)
Called and spoke with the pt on (08/07/15) and she stated that she has taking 3 of the Norco and have not had any side effects.   She wanted to know if the Norco will help with the pain.  She stated that she has foot pain only when she get up and places her feet on the floor and she trying to walking.  Please advise.//AB/CMA

## 2015-08-08 NOTE — Telephone Encounter (Signed)
Ok to continue with Norco. Should help with pain but if things are not improving, she needs to come back in to office.

## 2015-08-08 NOTE — Telephone Encounter (Signed)
Called and spoke with the pt and informed her of the note below.   Pt verbalized understanding and agreed.//AB/CMA

## 2015-08-09 DIAGNOSIS — M86672 Other chronic osteomyelitis, left ankle and foot: Secondary | ICD-10-CM | POA: Diagnosis not present

## 2015-08-10 DIAGNOSIS — L97522 Non-pressure chronic ulcer of other part of left foot with fat layer exposed: Secondary | ICD-10-CM | POA: Diagnosis not present

## 2015-08-10 DIAGNOSIS — E11621 Type 2 diabetes mellitus with foot ulcer: Secondary | ICD-10-CM | POA: Diagnosis not present

## 2015-08-10 DIAGNOSIS — L97422 Non-pressure chronic ulcer of left heel and midfoot with fat layer exposed: Secondary | ICD-10-CM | POA: Diagnosis not present

## 2015-08-10 DIAGNOSIS — S91329A Laceration with foreign body, unspecified foot, initial encounter: Secondary | ICD-10-CM | POA: Diagnosis not present

## 2015-08-10 DIAGNOSIS — L97412 Non-pressure chronic ulcer of right heel and midfoot with fat layer exposed: Secondary | ICD-10-CM | POA: Diagnosis not present

## 2015-08-10 DIAGNOSIS — L97512 Non-pressure chronic ulcer of other part of right foot with fat layer exposed: Secondary | ICD-10-CM | POA: Diagnosis not present

## 2015-08-10 DIAGNOSIS — M86672 Other chronic osteomyelitis, left ankle and foot: Secondary | ICD-10-CM | POA: Diagnosis not present

## 2015-08-13 DIAGNOSIS — M86672 Other chronic osteomyelitis, left ankle and foot: Secondary | ICD-10-CM | POA: Diagnosis not present

## 2015-08-14 DIAGNOSIS — M86672 Other chronic osteomyelitis, left ankle and foot: Secondary | ICD-10-CM | POA: Diagnosis not present

## 2015-08-14 DIAGNOSIS — E11621 Type 2 diabetes mellitus with foot ulcer: Secondary | ICD-10-CM | POA: Diagnosis not present

## 2015-08-14 DIAGNOSIS — L97513 Non-pressure chronic ulcer of other part of right foot with necrosis of muscle: Secondary | ICD-10-CM | POA: Diagnosis not present

## 2015-08-15 ENCOUNTER — Ambulatory Visit: Payer: PPO | Admitting: Hematology & Oncology

## 2015-08-15 ENCOUNTER — Ambulatory Visit: Payer: PPO

## 2015-08-15 ENCOUNTER — Other Ambulatory Visit: Payer: PPO

## 2015-08-15 DIAGNOSIS — E11621 Type 2 diabetes mellitus with foot ulcer: Secondary | ICD-10-CM | POA: Diagnosis not present

## 2015-08-15 DIAGNOSIS — M86672 Other chronic osteomyelitis, left ankle and foot: Secondary | ICD-10-CM | POA: Diagnosis not present

## 2015-08-15 DIAGNOSIS — L97513 Non-pressure chronic ulcer of other part of right foot with necrosis of muscle: Secondary | ICD-10-CM | POA: Diagnosis not present

## 2015-08-16 DIAGNOSIS — S91329A Laceration with foreign body, unspecified foot, initial encounter: Secondary | ICD-10-CM | POA: Diagnosis not present

## 2015-08-16 DIAGNOSIS — L97513 Non-pressure chronic ulcer of other part of right foot with necrosis of muscle: Secondary | ICD-10-CM | POA: Diagnosis not present

## 2015-08-16 DIAGNOSIS — L97422 Non-pressure chronic ulcer of left heel and midfoot with fat layer exposed: Secondary | ICD-10-CM | POA: Diagnosis not present

## 2015-08-16 DIAGNOSIS — L97412 Non-pressure chronic ulcer of right heel and midfoot with fat layer exposed: Secondary | ICD-10-CM | POA: Diagnosis not present

## 2015-08-16 DIAGNOSIS — M86672 Other chronic osteomyelitis, left ankle and foot: Secondary | ICD-10-CM | POA: Diagnosis not present

## 2015-08-16 DIAGNOSIS — E11621 Type 2 diabetes mellitus with foot ulcer: Secondary | ICD-10-CM | POA: Diagnosis not present

## 2015-08-17 DIAGNOSIS — L97513 Non-pressure chronic ulcer of other part of right foot with necrosis of muscle: Secondary | ICD-10-CM | POA: Diagnosis not present

## 2015-08-17 DIAGNOSIS — M86672 Other chronic osteomyelitis, left ankle and foot: Secondary | ICD-10-CM | POA: Diagnosis not present

## 2015-08-17 DIAGNOSIS — E11621 Type 2 diabetes mellitus with foot ulcer: Secondary | ICD-10-CM | POA: Diagnosis not present

## 2015-08-21 ENCOUNTER — Emergency Department (HOSPITAL_BASED_OUTPATIENT_CLINIC_OR_DEPARTMENT_OTHER)
Admission: EM | Admit: 2015-08-21 | Discharge: 2015-08-21 | Disposition: A | Discharge status: Home / Self Care | Payer: PPO | Attending: Emergency Medicine | Admitting: Emergency Medicine

## 2015-08-21 ENCOUNTER — Encounter (HOSPITAL_BASED_OUTPATIENT_CLINIC_OR_DEPARTMENT_OTHER): Payer: Self-pay | Admitting: *Deleted

## 2015-08-21 ENCOUNTER — Telehealth: Payer: Self-pay | Admitting: Physician Assistant

## 2015-08-21 DIAGNOSIS — I10 Essential (primary) hypertension: Secondary | ICD-10-CM | POA: Diagnosis not present

## 2015-08-21 DIAGNOSIS — Z794 Long term (current) use of insulin: Secondary | ICD-10-CM | POA: Diagnosis not present

## 2015-08-21 DIAGNOSIS — Z79899 Other long term (current) drug therapy: Secondary | ICD-10-CM | POA: Diagnosis not present

## 2015-08-21 DIAGNOSIS — K529 Noninfective gastroenteritis and colitis, unspecified: Secondary | ICD-10-CM | POA: Diagnosis not present

## 2015-08-21 DIAGNOSIS — E1140 Type 2 diabetes mellitus with diabetic neuropathy, unspecified: Secondary | ICD-10-CM | POA: Diagnosis not present

## 2015-08-21 DIAGNOSIS — Z7982 Long term (current) use of aspirin: Secondary | ICD-10-CM | POA: Diagnosis not present

## 2015-08-21 DIAGNOSIS — F319 Bipolar disorder, unspecified: Secondary | ICD-10-CM | POA: Insufficient documentation

## 2015-08-21 DIAGNOSIS — Z872 Personal history of diseases of the skin and subcutaneous tissue: Secondary | ICD-10-CM | POA: Diagnosis not present

## 2015-08-21 DIAGNOSIS — Z91040 Latex allergy status: Secondary | ICD-10-CM | POA: Diagnosis not present

## 2015-08-21 DIAGNOSIS — E785 Hyperlipidemia, unspecified: Secondary | ICD-10-CM | POA: Diagnosis not present

## 2015-08-21 DIAGNOSIS — Z7984 Long term (current) use of oral hypoglycemic drugs: Secondary | ICD-10-CM | POA: Insufficient documentation

## 2015-08-21 DIAGNOSIS — S91329A Laceration with foreign body, unspecified foot, initial encounter: Secondary | ICD-10-CM | POA: Diagnosis not present

## 2015-08-21 DIAGNOSIS — R112 Nausea with vomiting, unspecified: Secondary | ICD-10-CM | POA: Diagnosis not present

## 2015-08-21 LAB — CBC
HEMATOCRIT: 30 % — AB (ref 36.0–46.0)
HEMOGLOBIN: 9.3 g/dL — AB (ref 12.0–15.0)
MCH: 24.8 pg — ABNORMAL LOW (ref 26.0–34.0)
MCHC: 31 g/dL (ref 30.0–36.0)
MCV: 80 fL (ref 78.0–100.0)
Platelets: 347 10*3/uL (ref 150–400)
RBC: 3.75 MIL/uL — ABNORMAL LOW (ref 3.87–5.11)
RDW: 17.5 % — ABNORMAL HIGH (ref 11.5–15.5)
WBC: 11.8 10*3/uL — ABNORMAL HIGH (ref 4.0–10.5)

## 2015-08-21 LAB — COMPREHENSIVE METABOLIC PANEL
ALBUMIN: 3.5 g/dL (ref 3.5–5.0)
ALK PHOS: 113 U/L (ref 38–126)
ALT: 13 U/L — AB (ref 14–54)
AST: 14 U/L — AB (ref 15–41)
Anion gap: 8 (ref 5–15)
BILIRUBIN TOTAL: 1 mg/dL (ref 0.3–1.2)
BUN: 37 mg/dL — ABNORMAL HIGH (ref 6–20)
CHLORIDE: 102 mmol/L (ref 101–111)
CO2: 27 mmol/L (ref 22–32)
Calcium: 8.9 mg/dL (ref 8.9–10.3)
Creatinine, Ser: 1.02 mg/dL — ABNORMAL HIGH (ref 0.44–1.00)
GFR calc Af Amer: >60 mL/min (ref >60)
GFR, EST NON AFRICAN AMERICAN: 59 mL/min — AB (ref >60)
GLUCOSE: 250 mg/dL — AB (ref 65–99)
POTASSIUM: 4.1 mmol/L (ref 3.5–5.1)
Sodium: 137 mmol/L (ref 135–145)
TOTAL PROTEIN: 8 g/dL (ref 6.5–8.1)

## 2015-08-21 LAB — LIPASE, BLOOD: LIPASE: 17 U/L (ref 11–51)

## 2015-08-21 MED ORDER — SODIUM CHLORIDE 0.9 % IV BOLUS (SEPSIS)
1000.0000 mL | Freq: Once | INTRAVENOUS | Status: AC
  Administered 2015-08-21: 1000 mL via INTRAVENOUS

## 2015-08-21 MED ORDER — ONDANSETRON HCL 4 MG/2ML IJ SOLN
4.0000 mg | Freq: Once | INTRAMUSCULAR | Status: AC
  Administered 2015-08-21: 4 mg via INTRAVENOUS
  Filled 2015-08-21: qty 2

## 2015-08-21 MED ORDER — ONDANSETRON HCL 4 MG/2ML IJ SOLN: 4.0000 mg | Freq: Once | INTRAMUSCULAR | Status: DC | PRN

## 2015-08-21 MED ORDER — KETOROLAC TROMETHAMINE 30 MG/ML IJ SOLN
30.0000 mg | Freq: Once | INTRAMUSCULAR | Status: AC
  Administered 2015-08-21: 30 mg via INTRAVENOUS
  Filled 2015-08-21: qty 1

## 2015-08-21 MED ORDER — ONDANSETRON 8 MG PO TBDP: ORAL_TABLET | ORAL | Status: DC

## 2015-08-21 NOTE — ED Notes (Signed)
Pt reports vomiting x 2 days.  Reports that she is a diabetic CBG 220 PTA.

## 2015-08-21 NOTE — ED Provider Notes (Signed)
CSN: [id number]     Arrival date & time 08/21/15  1710 History  By signing my name below, I, Irene Pap, attest that this documentation has been prepared under the direction and in the presence of Veryl Speak, MD. Electronically Signed: Irene Pap, ED Scribe. 08/21/2015. 7:58 PM.  Chief Complaint  Patient presents with  . Emesis   Patient is a 60 y.o. female presenting with vomiting. The history is provided by the patient. No language interpreter was used.  Emesis Severity:  Moderate Duration:  2 days Timing:  Constant Progression:  Worsening Chronicity:  New Ineffective treatments:  None tried Associated symptoms: chills and diarrhea   Associated symptoms: no abdominal pain   Risk factors: diabetes   HPI Comments: Dana [name] is a 60 y.o. female with a hx of HTN, insulin dependent DM, and diabetic neuropathy who presents to the Emergency Department complaining of vomiting onset 2 days ago. Pt reports associated chills, diarrhea x1, and nausea. She reports that her CBG levels have been relatively normal, but higher in the past two days. Her CBG level is currently 220 in the ED. Pt states that her husband has not been feeling well, but does not have the same symptoms she does. Pt reports hx of hysterectomy. She denies fever or abdominal pain.    Past Medical History  Diagnosis Date  . Bipolar 1 disorder ([place])   . Hypertension     past hx of  . H/O hiatal hernia   . Numbness and tingling     Hx; of in B/LLE and B/LUE  . Hyperlipidemia   . Diabetes mellitus     INSULIN DEPENDENT  . Diabetic neuropathy ([place])   . Diabetic neuropathy ([place])   . Cellulitis and abscess of foot 03/08/2015   Past Surgical History  Procedure Laterality Date  . Breast surgery    . Abdominal hysterectomy    . Nasal septum surgery    . Amputation  10/24/2011    Procedure: AMPUTATION RAY;  Surgeon: Newt Minion, MD;  Location: [place];  Service: Orthopedics;  Laterality: Right;  Right Foot 3rd  Ray Amputation  . Tonsillectomy    . Adenoidectomy      Hx: of  . Amputation Right 12/16/2012    Procedure: Right Foot Transmetatarsal Amputation;  Surgeon: Newt Minion, MD;  Location: [place];  Service: Orthopedics;  Laterality: Right;  . Bladder tact   2002  . Colon surgery    . Amputation Left 03/09/2015    Procedure: LEFT FOOT 1ST RAY AMPUTATION;  Surgeon: Newt Minion, MD;  Location: [place];  Service: Orthopedics;  Laterality: Left;   Family History  Problem Relation Age of Onset  . Diabetes Mother   . Mental illness Mother   . Hypertension Father   . Diabetes Maternal Grandmother   . Heart disease Neg Hx    Social History  Substance Use Topics  . Smoking status: Never Smoker   . Smokeless tobacco: Never Used  . Alcohol Use: No   OB History    No data available     Review of Systems  Constitutional: Positive for chills. Negative for fever.  Gastrointestinal: Positive for nausea, vomiting and diarrhea. Negative for abdominal pain.  All other systems reviewed and are negative.   Allergies  Ativan; Darvocet; Demerol; Latex; Lithium; and Oxycodone  Home Medications   Prior to Admission medications   Medication Sig Start Date End Date Taking? Authorizing Provider  aspirin 81 MG tablet Take  81 mg by mouth daily.    Historical Provider, MD  atorvastatin (LIPITOR) 20 MG tablet Take 1 tablet (20 mg total) by mouth at bedtime. 11/08/14   Brunetta Jeans, PA-C  Blood Glucose Monitoring Suppl (FREESTYLE LITE) DEVI Use to check blood sugar 3 times a day dx code E11.39 05/03/15   Elayne Snare, MD  DULoxetine (CYMBALTA) 30 MG capsule Take 1 capsule (30 mg total) by mouth daily. 07/31/15   Brunetta Jeans, PA-C  escitalopram (LEXAPRO) 5 MG tablet Take 7.5 mg by mouth every morning.     Historical Provider, MD  gabapentin (NEURONTIN) 300 MG capsule Take 900 mg each at breakfast and lunch. Take 1200 mg at night. 07/17/15   Brunetta Jeans, PA-C  glucose blood (FREESTYLE LITE) test strip  Use as instructed to check blood sugar 3 times a day dx code E11.39 05/03/15   Elayne Snare, MD  HYDROcodone-acetaminophen (NORCO) 5-325 MG tablet Take 1 tablet by mouth every 8 (eight) hours as needed for moderate pain. 08/06/15   Brunetta Jeans, PA-C  Insulin Disposable Pump (V-GO 30) KIT Use one per day 03/28/15   Elayne Snare, MD  insulin regular (NOVOLIN R RELION) 100 units/mL injection Take 6 units with breakfast, 8 units with lunch and 8 units with dinner Patient taking differently: Take 6 units with breakfast, 8 units with lunch and 8 units with dinner (takes 68 units with V-go per day) 04/16/15   Elayne Snare, MD  Lancets (FREESTYLE) lancets Use as instructed to check blood sugar 3 times per day dx code E11.39 05/03/15   Elayne Snare, MD  metFORMIN (GLUCOPHAGE) 500 MG tablet Take 1000 mg each morning and 500 mg each evening Patient taking differently: Take 500-1,000 mg by mouth 2 (two) times daily with a meal. Take 2 tablets in the morning Take 1 tablet in the evening 02/21/15   Brunetta Jeans, PA-C  REGRANEX 0.01 % gel AS DIRECTED 07/11/15   Historical Provider, MD   BP 106/61 mmHg  Pulse 110  Temp(Src) 98.6 F (37 C) (Oral)  Resp 18  Ht _0  (1.549 m)  Wt 160 lb (72.576 kg)  BMI 30.25 kg/m2  SpO2 95% Physical Exam  Constitutional: She is oriented to person, place, and time. She appears well-developed and well-nourished.  HENT:  Head: Normocephalic and atraumatic.  Eyes: EOM are normal. Pupils are equal, round, and reactive to light.  Neck: Normal range of motion. Neck supple.  Cardiovascular: Normal rate, regular rhythm and normal heart sounds.  Exam reveals no gallop and no friction rub.   No murmur heard. Pulmonary/Chest: Effort normal and breath sounds normal. She has no wheezes.  Abdominal: Soft. There is no tenderness.  Musculoskeletal: Normal range of motion.  Neurological: She is alert and oriented to person, place, and time.  Skin: Skin is warm and dry.  Psychiatric: She  has a normal mood and affect. Her behavior is normal.  Nursing note and vitals reviewed.   ED Course  Procedures (including critical care time) DIAGNOSTIC STUDIES: Oxygen Saturation is 95% on RA, adequate by my interpretation.    COORDINATION OF CARE: 7:24 PM-Discussed treatment plan which includes labs, IV fluids, Toradol and Zofran with pt at bedside and pt agreed to plan.    Labs Review Labs Reviewed  COMPREHENSIVE METABOLIC PANEL - Abnormal; Notable for the following:    Glucose, Bld 250 (*)    BUN 37 (*)    Creatinine, Ser 1.02 (*)    AST 14 (*)  ALT 13 (*)    GFR calc non Af Amer 59 (*)    All other components within normal limits  CBC - Abnormal; Notable for the following:    WBC 11.8 (*)    RBC 3.75 (*)    Hemoglobin 9.3 (*)    HCT 30.0 (*)    MCH 24.8 (*)    RDW 17.5 (*)    All other components within normal limits  LIPASE, BLOOD    Imaging Review No results found. I have personally reviewed and evaluated these images and lab results as part of my medical decision-making.   EKG Interpretation None      MDM   Final diagnoses:  None   Patient's presentation, workup, and physical examination are all consistent with a viral gastroenteritis. She is feeling better after IV fluids and medications. She will be discharged with Zofran and return as needed.  I personally performed the services described in this documentation, which was scribed in my presence. The recorded information has been reviewed and is accurate.       Veryl Speak, MD 08/21/15 2024

## 2015-08-21 NOTE — Telephone Encounter (Signed)
[facility] - Client [name]    --------------------------------------------------------------------------------   Patient Name: Dana [name]  Gender: Female  DOB: [birth year]   Age: 60 Y 66 M  Return Phone Number: [phone] (Primary)  Address:     City/State/Zip:  [place]     Client [facility] - Client  Client Site [facility] - Day  Physician Elyn Aquas   Contact Type Call  Who Is Calling Patient / Member / Family / Caregiver  Call Type Triage / Clinical  Caller Name Ricky [name]  Relationship To Patient Spouse  Return Phone Number [phone] (Primary)  Chief Complaint Vomiting  Reason for Call Symptomatic / Request for [place] stated wife has been vomiting and having chills.  Appointment Disposition EMR Appointment Not Necessary  Info pasted into Epic Yes  PreDisposition InappropriateToAsk  Translation No       Nurse Assessment  Nurse: Amalia Hailey, RN, Melissa Date/Time (Eastern Time): 08/21/2015 3:40:36 PM  Confirm and document reason for call. If symptomatic, describe symptoms. You must click the next button to save text entered. ---Caller stated wife has been vomiting and having chills.    Has the patient traveled out of the country within the last 30 days? ---Not Applicable    Does the patient have any new or worsening symptoms? ---Yes    Will a triage be completed? ---Yes    Related visit to physician within the last 2 weeks? ---No    Does the PT have any chronic conditions? (i.e. diabetes, asthma, etc.) ---Yes    List chronic conditions. ---hypertension, gabapentin-take for feet    Is this a behavioral health or substance abuse call? ---No           Guidelines          Guideline Title Affirmed Question Affirmed Notes Nurse Date/Time (Eastern Time)  Vomiting High-risk adult (e.g., diabetes mellitus, brain  tumor, V-P shunt, hernia)    Amalia Hailey, RN, Melissa 08/21/2015 3:43:12 PM    Disp. Time Eilene Ghazi Time) Disposition Final User    08/21/2015 3:10:59 PM Attempt made - message left   Amalia Hailey, RN, Lenna Sciara      08/21/2015 3:46:39 PM Go to ED Now (or PCP triage) Lanelle Bal, RN, Lenna Sciara            Caller Understands: Yes  Disagree/Comply: Comply       Care Advice Given Per Guideline        GO TO ED NOW (OR PCP TRIAGE): * IF NO PCP TRIAGE: You need to be seen. Go to the [facility] at [facility] within the next hour. Leave as soon as you can.        --------------------------------------------------------------------------------         Comments  User: Colin Ina, RN Date/Time (Eastern Time): 08/21/2015 3:47:32 PM  Caller reports he is disabled and in a wheelchair, will have to get help to bring her into facility. Caller advised to call an ambulance is unable to get help. Caller reports the pt is diabetic and has a pump.    Referrals  [facility] - ED

## 2015-08-21 NOTE — Discharge Instructions (Signed)
Zofran as prescribed as needed for nausea.  Return to the ER if you develop high fever, severe abdominal pain, bloody stool, or other new and concerning symptoms.

## 2015-08-22 NOTE — Telephone Encounter (Signed)
Pt went to the ER as advised.

## 2015-08-24 DIAGNOSIS — S91329A Laceration with foreign body, unspecified foot, initial encounter: Secondary | ICD-10-CM | POA: Diagnosis not present

## 2015-08-28 DIAGNOSIS — M86672 Other chronic osteomyelitis, left ankle and foot: Secondary | ICD-10-CM | POA: Diagnosis not present

## 2015-08-28 DIAGNOSIS — E11621 Type 2 diabetes mellitus with foot ulcer: Secondary | ICD-10-CM | POA: Diagnosis not present

## 2015-08-29 ENCOUNTER — Ambulatory Visit: Payer: PPO | Admitting: Hematology & Oncology

## 2015-08-29 ENCOUNTER — Other Ambulatory Visit: Payer: PPO

## 2015-08-29 ENCOUNTER — Ambulatory Visit: Payer: PPO

## 2015-08-29 DIAGNOSIS — M86672 Other chronic osteomyelitis, left ankle and foot: Secondary | ICD-10-CM | POA: Diagnosis not present

## 2015-08-29 DIAGNOSIS — E11621 Type 2 diabetes mellitus with foot ulcer: Secondary | ICD-10-CM | POA: Diagnosis not present

## 2015-08-30 DIAGNOSIS — M86672 Other chronic osteomyelitis, left ankle and foot: Secondary | ICD-10-CM | POA: Diagnosis not present

## 2015-08-30 DIAGNOSIS — E11621 Type 2 diabetes mellitus with foot ulcer: Secondary | ICD-10-CM | POA: Diagnosis not present

## 2015-08-31 ENCOUNTER — Other Ambulatory Visit (INDEPENDENT_AMBULATORY_CARE_PROVIDER_SITE_OTHER): Payer: PPO

## 2015-08-31 DIAGNOSIS — L97522 Non-pressure chronic ulcer of other part of left foot with fat layer exposed: Secondary | ICD-10-CM | POA: Diagnosis not present

## 2015-08-31 DIAGNOSIS — E1165 Type 2 diabetes mellitus with hyperglycemia: Secondary | ICD-10-CM | POA: Diagnosis not present

## 2015-08-31 DIAGNOSIS — E11621 Type 2 diabetes mellitus with foot ulcer: Secondary | ICD-10-CM | POA: Diagnosis not present

## 2015-08-31 DIAGNOSIS — IMO0002 Reserved for concepts with insufficient information to code with codable children: Secondary | ICD-10-CM

## 2015-08-31 DIAGNOSIS — Z794 Long term (current) use of insulin: Secondary | ICD-10-CM | POA: Diagnosis not present

## 2015-08-31 DIAGNOSIS — L97412 Non-pressure chronic ulcer of right heel and midfoot with fat layer exposed: Secondary | ICD-10-CM | POA: Diagnosis not present

## 2015-08-31 DIAGNOSIS — E1139 Type 2 diabetes mellitus with other diabetic ophthalmic complication: Secondary | ICD-10-CM

## 2015-08-31 DIAGNOSIS — M86672 Other chronic osteomyelitis, left ankle and foot: Secondary | ICD-10-CM | POA: Diagnosis not present

## 2015-08-31 DIAGNOSIS — L97512 Non-pressure chronic ulcer of other part of right foot with fat layer exposed: Secondary | ICD-10-CM | POA: Diagnosis not present

## 2015-08-31 DIAGNOSIS — L97422 Non-pressure chronic ulcer of left heel and midfoot with fat layer exposed: Secondary | ICD-10-CM | POA: Diagnosis not present

## 2015-08-31 LAB — COMPREHENSIVE METABOLIC PANEL
ALT: 15 U/L (ref 0–35)
AST: 11 U/L (ref 0–37)
Albumin: 3 g/dL — ABNORMAL LOW (ref 3.5–5.2)
Alkaline Phosphatase: 138 U/L — ABNORMAL HIGH (ref 39–117)
BUN: 22 mg/dL (ref 6–23)
CHLORIDE: 101 meq/L (ref 96–112)
CO2: 29 meq/L (ref 19–32)
Calcium: 9.3 mg/dL (ref 8.4–10.5)
Creatinine, Ser: 0.76 mg/dL (ref 0.40–1.20)
GFR: 82.9 mL/min (ref >60.00)
GLUCOSE: 236 mg/dL — AB (ref 70–99)
Potassium: 5 mEq/L (ref 3.5–5.1)
Sodium: 137 mEq/L (ref 135–145)
Total Bilirubin: 0.3 mg/dL (ref 0.2–1.2)
Total Protein: 7.4 g/dL (ref 6.0–8.3)

## 2015-09-03 ENCOUNTER — Ambulatory Visit: Payer: PPO | Admitting: Physician Assistant

## 2015-09-03 DIAGNOSIS — M86672 Other chronic osteomyelitis, left ankle and foot: Secondary | ICD-10-CM | POA: Diagnosis not present

## 2015-09-03 DIAGNOSIS — E11621 Type 2 diabetes mellitus with foot ulcer: Secondary | ICD-10-CM | POA: Diagnosis not present

## 2015-09-04 DIAGNOSIS — M86672 Other chronic osteomyelitis, left ankle and foot: Secondary | ICD-10-CM | POA: Diagnosis not present

## 2015-09-04 DIAGNOSIS — E11621 Type 2 diabetes mellitus with foot ulcer: Secondary | ICD-10-CM | POA: Diagnosis not present

## 2015-09-05 ENCOUNTER — Ambulatory Visit: Payer: PPO | Admitting: Endocrinology

## 2015-09-05 DIAGNOSIS — E11621 Type 2 diabetes mellitus with foot ulcer: Secondary | ICD-10-CM | POA: Diagnosis not present

## 2015-09-05 DIAGNOSIS — M86672 Other chronic osteomyelitis, left ankle and foot: Secondary | ICD-10-CM | POA: Diagnosis not present

## 2015-09-06 DIAGNOSIS — E11621 Type 2 diabetes mellitus with foot ulcer: Secondary | ICD-10-CM | POA: Diagnosis not present

## 2015-09-06 DIAGNOSIS — M86672 Other chronic osteomyelitis, left ankle and foot: Secondary | ICD-10-CM | POA: Diagnosis not present

## 2015-09-07 DIAGNOSIS — H25812 Combined forms of age-related cataract, left eye: Secondary | ICD-10-CM | POA: Diagnosis not present

## 2015-09-07 DIAGNOSIS — H04123 Dry eye syndrome of bilateral lacrimal glands: Secondary | ICD-10-CM | POA: Diagnosis not present

## 2015-09-07 DIAGNOSIS — H25811 Combined forms of age-related cataract, right eye: Secondary | ICD-10-CM | POA: Diagnosis not present

## 2015-09-07 DIAGNOSIS — H52223 Regular astigmatism, bilateral: Secondary | ICD-10-CM | POA: Diagnosis not present

## 2015-09-10 DIAGNOSIS — L97522 Non-pressure chronic ulcer of other part of left foot with fat layer exposed: Secondary | ICD-10-CM | POA: Diagnosis not present

## 2015-09-10 DIAGNOSIS — L97422 Non-pressure chronic ulcer of left heel and midfoot with fat layer exposed: Secondary | ICD-10-CM | POA: Diagnosis not present

## 2015-09-10 DIAGNOSIS — L97512 Non-pressure chronic ulcer of other part of right foot with fat layer exposed: Secondary | ICD-10-CM | POA: Diagnosis not present

## 2015-09-10 DIAGNOSIS — L97412 Non-pressure chronic ulcer of right heel and midfoot with fat layer exposed: Secondary | ICD-10-CM | POA: Diagnosis not present

## 2015-09-10 DIAGNOSIS — S91329A Laceration with foreign body, unspecified foot, initial encounter: Secondary | ICD-10-CM | POA: Diagnosis not present

## 2015-09-10 DIAGNOSIS — M86672 Other chronic osteomyelitis, left ankle and foot: Secondary | ICD-10-CM | POA: Diagnosis not present

## 2015-09-10 DIAGNOSIS — E11621 Type 2 diabetes mellitus with foot ulcer: Secondary | ICD-10-CM | POA: Diagnosis not present

## 2015-09-11 ENCOUNTER — Telehealth: Payer: Self-pay | Admitting: *Deleted

## 2015-09-11 DIAGNOSIS — Z79899 Other long term (current) drug therapy: Secondary | ICD-10-CM | POA: Diagnosis not present

## 2015-09-11 DIAGNOSIS — E11319 Type 2 diabetes mellitus with unspecified diabetic retinopathy without macular edema: Secondary | ICD-10-CM

## 2015-09-11 DIAGNOSIS — Z794 Long term (current) use of insulin: Principal | ICD-10-CM

## 2015-09-11 DIAGNOSIS — E1165 Type 2 diabetes mellitus with hyperglycemia: Principal | ICD-10-CM

## 2015-09-11 MED ORDER — GABAPENTIN 300 MG PO CAPS: ORAL_CAPSULE | ORAL | Status: DC

## 2015-09-11 NOTE — Telephone Encounter (Signed)
Rx request to pharmacy/SLS  

## 2015-09-12 DIAGNOSIS — L97513 Non-pressure chronic ulcer of other part of right foot with necrosis of muscle: Secondary | ICD-10-CM | POA: Diagnosis not present

## 2015-09-12 DIAGNOSIS — M86672 Other chronic osteomyelitis, left ankle and foot: Secondary | ICD-10-CM | POA: Diagnosis not present

## 2015-09-12 DIAGNOSIS — E11621 Type 2 diabetes mellitus with foot ulcer: Secondary | ICD-10-CM | POA: Diagnosis not present

## 2015-09-13 DIAGNOSIS — E11621 Type 2 diabetes mellitus with foot ulcer: Secondary | ICD-10-CM | POA: Diagnosis not present

## 2015-09-13 DIAGNOSIS — M86672 Other chronic osteomyelitis, left ankle and foot: Secondary | ICD-10-CM | POA: Diagnosis not present

## 2015-09-13 DIAGNOSIS — L97513 Non-pressure chronic ulcer of other part of right foot with necrosis of muscle: Secondary | ICD-10-CM | POA: Diagnosis not present

## 2015-09-14 DIAGNOSIS — M86672 Other chronic osteomyelitis, left ankle and foot: Secondary | ICD-10-CM | POA: Diagnosis not present

## 2015-09-14 DIAGNOSIS — E11621 Type 2 diabetes mellitus with foot ulcer: Secondary | ICD-10-CM | POA: Diagnosis not present

## 2015-09-14 DIAGNOSIS — L97513 Non-pressure chronic ulcer of other part of right foot with necrosis of muscle: Secondary | ICD-10-CM | POA: Diagnosis not present

## 2015-09-17 ENCOUNTER — Encounter: Payer: Self-pay | Admitting: Physician Assistant

## 2015-09-17 ENCOUNTER — Ambulatory Visit (INDEPENDENT_AMBULATORY_CARE_PROVIDER_SITE_OTHER): Payer: PPO | Admitting: Physician Assistant

## 2015-09-17 VITALS — BP 108/60 | HR 90 | Temp 97.6°F | Ht 62.0 in | Wt 164.2 lb

## 2015-09-17 DIAGNOSIS — E1142 Type 2 diabetes mellitus with diabetic polyneuropathy: Secondary | ICD-10-CM | POA: Diagnosis not present

## 2015-09-17 DIAGNOSIS — Z794 Long term (current) use of insulin: Secondary | ICD-10-CM | POA: Diagnosis not present

## 2015-09-17 DIAGNOSIS — E11319 Type 2 diabetes mellitus with unspecified diabetic retinopathy without macular edema: Secondary | ICD-10-CM

## 2015-09-17 DIAGNOSIS — Z23 Encounter for immunization: Secondary | ICD-10-CM | POA: Diagnosis not present

## 2015-09-17 DIAGNOSIS — E1165 Type 2 diabetes mellitus with hyperglycemia: Secondary | ICD-10-CM

## 2015-09-17 MED ORDER — GABAPENTIN 600 MG PO TABS: 1200.0000 mg | ORAL_TABLET | Freq: Three times a day (TID) | ORAL | Status: DC

## 2015-09-17 MED ORDER — METFORMIN HCL 500 MG PO TABS: ORAL_TABLET | ORAL | Status: DC

## 2015-09-17 NOTE — Assessment & Plan Note (Signed)
Deteriorating. Followed by Endocrinology. Sugars have been much improved from prior. Last A1C at 7.1. Followed by Wound care and Podiatry. Has not tolerated Rx analgesics or SNRI. Will increase Gabapentin to 1200 mg TID. FU 1 month. If no major improvement will consider Elavil/Pamelor or may need referral to pain management.

## 2015-09-17 NOTE — Progress Notes (Signed)
Pre visit review using our clinic review tool, if applicable. No additional management support is needed unless otherwise documented below in the visit note. 

## 2015-09-17 NOTE — Progress Notes (Signed)
Patient presents to clinic today for follow-up of polyneuropathic pain from her diabetes, coupled with some phantom limb pain. Patient is s/p partial R foot amputation. Is currently on Gabapentin 900 mg AM, Noon and 1200 mg PM. Was previously tried on Cymbalta, Tramadol and Norco but could not tolerate any of this medications due to side effects (agitation, nausea/vomiting, etc). Endorses pain is worsening despite medication and good sugar readings (Last A1C at 7.1 - Has FU scheduled with Endocrinology this Friday).  Past Medical History  Diagnosis Date  . Bipolar 1 disorder ([place])   . Hypertension     past hx of  . H/O hiatal hernia   . Numbness and tingling     Hx; of in B/LLE and B/LUE  . Hyperlipidemia   . Diabetes mellitus     INSULIN DEPENDENT  . Diabetic neuropathy ([place])   . Diabetic neuropathy ([place])   . Cellulitis and abscess of foot 03/08/2015    Current Outpatient Prescriptions on File Prior to Visit  Medication Sig Dispense Refill  . aspirin 81 MG tablet Take 81 mg by mouth daily.    Marland Kitchen atorvastatin (LIPITOR) 20 MG tablet Take 1 tablet (20 mg total) by mouth at bedtime. 30 tablet 5  . Blood Glucose Monitoring Suppl (FREESTYLE LITE) DEVI Use to check blood sugar 3 times a day dx code E11.39 1 each 0  . glucose blood (FREESTYLE LITE) test strip Use as instructed to check blood sugar 3 times a day dx code E11.39 100 each 3  . Insulin Disposable Pump (V-GO 30) KIT Use one per day 1 kit 2  . insulin regular (NOVOLIN R RELION) 100 units/mL injection Take 6 units with breakfast, 8 units with lunch and 8 units with dinner (Patient taking differently: Take 6 units with breakfast, 8 units with lunch and 8 units with dinner (takes 68 units with V-go per day)) 10 mL 3  . Lancets (FREESTYLE) lancets Use as instructed to check blood sugar 3 times per day dx code E11.39 100 each 3  . REGRANEX 0.01 % gel AS DIRECTED     No current facility-administered medications on file prior to visit.     Allergies  Allergen Reactions  . Ativan [Lorazepam] Other (See Comments)    Abnormal behavior  . Darvocet [Propoxyphene N-Acetaminophen] Itching  . Demerol [Meperidine] Other (See Comments)    Abnormal behavior  . Latex Itching  . Lithium Nausea And Vomiting    Can not keep this medication down. It makes her terribly ill.  . Oxycodone Other (See Comments)    Abnormal behavior    Family History  Problem Relation Age of Onset  . Diabetes Mother   . Mental illness Mother   . Hypertension Father   . Diabetes Maternal Grandmother   . Heart disease Neg Hx     Social History   Social History  . Marital Status: Married    Spouse Name: N/A  . Number of Children: N/A  . Years of Education: N/A   Social History Main Topics  . Smoking status: Never Smoker   . Smokeless tobacco: Never Used  . Alcohol Use: No  . Drug Use: No  . Sexual Activity: Not Asked   Other Topics Concern  . None   Social History Narrative    Review of Systems: Pertinent ROS are listed in HPI  Physical Examination: General appearance: alert, cooperative, appears stated age and no distress Lungs: clear to auscultation bilaterally Heart: regular rate and rhythm, S1,  S2 normal, no murmur, click, rub or gallop  Psych: Normal affect Neuro: A/O x 3.   Assessment/Plan: Diabetic polyneuropathy associated with type 2 diabetes mellitus ([place]) Deteriorating. Followed by Endocrinology. Sugars have been much improved from prior. Last A1C at 7.1. Followed by Wound care and Podiatry. Has not tolerated Rx analgesics or SNRI. Will increase Gabapentin to 1200 mg TID. FU 1 month. If no major improvement will consider Elavil/Pamelor or may need referral to pain management.

## 2015-09-17 NOTE — Patient Instructions (Signed)
Please continue chronic medications with the following changes:  -- I have increased your Gabapentin to 1200 mg three times daily  --  A new prescription has been sent in for 600 mg capsules -- take 2 by mouth three times a day.  Follow-up with the Endocrinologist as scheduled.  Follow-up with me in 1 month. Return sooner if needed.

## 2015-09-19 ENCOUNTER — Telehealth: Payer: Self-pay | Admitting: Physician Assistant

## 2015-09-19 DIAGNOSIS — G546 Phantom limb syndrome with pain: Secondary | ICD-10-CM

## 2015-09-19 DIAGNOSIS — E1142 Type 2 diabetes mellitus with diabetic polyneuropathy: Secondary | ICD-10-CM

## 2015-09-19 DIAGNOSIS — G894 Chronic pain syndrome: Secondary | ICD-10-CM

## 2015-09-19 NOTE — Telephone Encounter (Signed)
Pt called in to update PCP on her concern. Pt says that she was advised to increase dosage of gabapentin. Pt says that her feet still hurt, she says her restless legs woke her up out of her sleep.    CB: [phone]

## 2015-09-19 NOTE — Telephone Encounter (Signed)
LMOM with contact name and number for return call RE: new medication and further provider instructions/SLS 05/10

## 2015-09-19 NOTE — Telephone Encounter (Signed)
Please Advise

## 2015-09-19 NOTE — Telephone Encounter (Signed)
We could potentially try a low dose of Requip to help with nighttime symptoms. Let me know if she is willing and I will send in. Did she start the B Complex vitamin as directed.

## 2015-09-20 ENCOUNTER — Ambulatory Visit: Payer: PPO

## 2015-09-20 ENCOUNTER — Ambulatory Visit (HOSPITAL_BASED_OUTPATIENT_CLINIC_OR_DEPARTMENT_OTHER): Payer: PPO | Admitting: Hematology & Oncology

## 2015-09-20 ENCOUNTER — Encounter: Payer: Self-pay | Admitting: Endocrinology

## 2015-09-20 ENCOUNTER — Encounter: Payer: Self-pay | Admitting: Hematology & Oncology

## 2015-09-20 ENCOUNTER — Ambulatory Visit (INDEPENDENT_AMBULATORY_CARE_PROVIDER_SITE_OTHER): Payer: PPO | Admitting: Endocrinology

## 2015-09-20 ENCOUNTER — Other Ambulatory Visit: Payer: Self-pay | Admitting: *Deleted

## 2015-09-20 ENCOUNTER — Other Ambulatory Visit (HOSPITAL_BASED_OUTPATIENT_CLINIC_OR_DEPARTMENT_OTHER): Payer: PPO

## 2015-09-20 VITALS — BP 124/68 | HR 72 | Temp 97.9°F | Resp 14 | Ht 62.0 in | Wt 166.4 lb

## 2015-09-20 VITALS — BP 116/74 | HR 69 | Temp 97.7°F | Resp 18 | Ht 62.0 in | Wt 166.0 lb

## 2015-09-20 DIAGNOSIS — E1165 Type 2 diabetes mellitus with hyperglycemia: Secondary | ICD-10-CM

## 2015-09-20 DIAGNOSIS — D508 Other iron deficiency anemias: Secondary | ICD-10-CM

## 2015-09-20 DIAGNOSIS — E1142 Type 2 diabetes mellitus with diabetic polyneuropathy: Secondary | ICD-10-CM | POA: Diagnosis not present

## 2015-09-20 DIAGNOSIS — D631 Anemia in chronic kidney disease: Secondary | ICD-10-CM

## 2015-09-20 DIAGNOSIS — N183 Chronic kidney disease, stage 3 unspecified: Secondary | ICD-10-CM

## 2015-09-20 DIAGNOSIS — D649 Anemia, unspecified: Secondary | ICD-10-CM

## 2015-09-20 DIAGNOSIS — Z794 Long term (current) use of insulin: Secondary | ICD-10-CM | POA: Diagnosis not present

## 2015-09-20 HISTORY — DX: Anemia in chronic kidney disease: D63.1

## 2015-09-20 HISTORY — DX: Other iron deficiency anemias: D50.8

## 2015-09-20 LAB — CBC WITH DIFFERENTIAL (CANCER CENTER ONLY)
BASO#: 0 10*3/uL (ref 0.0–0.2)
BASO%: 0.5 % (ref 0.0–2.0)
EOS%: 5 % (ref 0.0–7.0)
Eosinophils Absolute: 0.3 10*3/uL (ref 0.0–0.5)
HEMATOCRIT: 24.1 % — AB (ref 34.8–46.6)
HGB: 7.4 g/dL — ABNORMAL LOW (ref 11.6–15.9)
LYMPH#: 2.3 10*3/uL (ref 0.9–3.3)
LYMPH%: 38.2 % (ref 14.0–48.0)
MCH: 25.1 pg — ABNORMAL LOW (ref 26.0–34.0)
MCHC: 30.7 g/dL — ABNORMAL LOW (ref 32.0–36.0)
MCV: 82 fL (ref 81–101)
MONO#: 0.5 10*3/uL (ref 0.1–0.9)
MONO%: 8.7 % (ref 0.0–13.0)
NEUT%: 47.6 % (ref 39.6–80.0)
NEUTROS ABS: 2.8 10*3/uL (ref 1.5–6.5)
PLATELETS: 384 10*3/uL (ref 145–400)
RBC: 2.95 10*6/uL — AB (ref 3.70–5.32)
RDW: 17.5 % — AB (ref 11.1–15.7)
WBC: 6 10*3/uL (ref 3.9–10.0)

## 2015-09-20 LAB — CMP (CANCER CENTER ONLY)
ALK PHOS: 97 U/L — AB (ref 26–84)
ALT: 23 U/L (ref 10–47)
AST: 15 U/L (ref 11–38)
Albumin: 2.9 g/dL — ABNORMAL LOW (ref 3.3–5.5)
BILIRUBIN TOTAL: 0.5 mg/dL (ref 0.20–1.60)
BUN: 21 mg/dL (ref 7–22)
CALCIUM: 9.4 mg/dL (ref 8.0–10.3)
CO2: 30 mEq/L (ref 18–33)
Chloride: 99 mEq/L (ref 98–108)
Creat: 1 mg/dl (ref 0.6–1.2)
GLUCOSE: 186 mg/dL — AB (ref 73–118)
Potassium: 4.7 mEq/L (ref 3.3–4.7)
Sodium: 138 mEq/L (ref 128–145)
Total Protein: 7.3 g/dL (ref 6.4–8.1)

## 2015-09-20 LAB — CHCC SATELLITE - SMEAR

## 2015-09-20 LAB — POCT GLYCOSYLATED HEMOGLOBIN (HGB A1C): Hemoglobin A1C: 7.5

## 2015-09-20 NOTE — Progress Notes (Signed)
Patient ID: Dana [name], female   DOB: Aug 23, 1955, 60 y.o.   MRN: [id number]           Reason for Appointment: Follow-up for Type 2 Diabetes  Referring physician: Elyn Aquas  History of Present Illness:          Date of diagnosis of type 2 diabetes mellitus: 2006       Background history:   She thinks her blood sugar was 300-400 at the time of diagnosis and she was started on insulin soon after this. She thinks she has been on metformin only for the last 5 years Her A1c history is available since only about 2013 and this had been consistently over 10%  Recent history:    She  has had  poor control of her diabetes with pre-consultation A1c of 13%, was on Levemir  Because of her poor control and need for multiple injections and cost of brand name insulin she was started on the V-go pump after instruction by the nurse educator on 04/11/15 With this her blood sugars have been much improved  Her A1c is now 7.5, previously 7.1%  INSULIN regimen is:  V-go 30 units pump, 3 clicks before meals, 8-3J daily  Current blood sugar patterns, daily management and problems identified:   She has been using the V-go pump regularly; now having no difficulties with the pump adhering to the skin  She has been checking her blood sugars regularly but mostly in the mornings and some before and after supper; during the day she is usually getting treatment for her foot ulcers and has glucose checked at the hyper[facility]ic Center  Despite her taking off the pump for the hyperbaric treatment in the mornings she does not think her blood sugar goes up  Blood sugars are somewhat variable in the morning with sporadic high readings although most of her high readings were when she was having gastroenteritis last month  Also has a couple of readings over 300 midday when she was having a GI illness and he was not taking extra insulin for higher readings  Blood sugars after supper at night have been excellent most  of the time and recently low-normal at times too  Her weight is stable despite not being able to do any exercise, previously had gained weight  She does bolus according to her carbohydrate intake and meal size and with larger evening meals may take 4 clicks  Does not understand the need for checking more frequently and consistently taking insulin or higher readings when she is sick                         Non-insulin hypoglycemic drugs the patient is taking are: Metformin Side effects from medications have been: None  Compliance with the medical regimen: Good Hypoglycemia: none   Glucose monitoring:  done 2 times a day         Glucometer:  FreeStyle .      Blood Glucose readings by time of day  Mean values apply above for all meters except median for One Touch  PRE-MEAL Fasting Lunch Dinner Bedtime Overall  Glucose range: 79-233   83-161  87-211    Mean/median: 146    150  156    Self-care:  Typical meal intake: Breakfast is irregular otherwise may have toast and an egg or sausage.  Lunch is a sandwich or cheeseburger, evening meal is meat and 2 vegetables.  For snacks he will  have peanut butter crackers     She has regular drinks, trying to reduce these now          Dietician visit, most recent: Never               Exercise: none  Weight history: Stable over the last 3-4 years, was 400 pounds about 5 years ago  Wt Readings from Last 3 Encounters:  09/20/15 166 lb 6.4 oz (75.479 kg)  09/20/15 166 lb (75.297 kg)  09/17/15 164 lb 3.2 oz (74.481 kg)    Glycemic control:   Lab Results  Component Value Date   HGBA1C 7.5 09/20/2015   HGBA1C 7.1 06/06/2015   HGBA1C 13.0* 02/21/2015   Lab Results  Component Value Date   MICROALBUR 4.0* 11/08/2014   LDLCALC 57 03/09/2015   CREATININE 1.0 09/20/2015         Medication List       This list is accurate as of: 09/20/15  9:20 PM.  Always use your most recent med list.               aspirin 81 MG tablet  Take 81  mg by mouth daily.     atorvastatin 20 MG tablet  Commonly known as:  LIPITOR  Take 1 tablet (20 mg total) by mouth at bedtime.     escitalopram 10 MG tablet  Commonly known as:  LEXAPRO  Take 1 tablet by mouth daily.     freestyle lancets  Use as instructed to check blood sugar 3 times per day dx code E11.39     FREESTYLE LITE Devi  Use to check blood sugar 3 times a day dx code E11.39     gabapentin 600 MG tablet  Commonly known as:  NEURONTIN  Take 2 tablets (1,200 mg total) by mouth 3 (three) times daily.     glucose blood test strip  Commonly known as:  FREESTYLE LITE  Use as instructed to check blood sugar 3 times a day dx code E11.39     insulin regular 100 units/mL injection  Commonly known as:  NOVOLIN R RELION  Take 6 units with breakfast, 8 units with lunch and 8 units with dinner     metFORMIN 500 MG tablet  Commonly known as:  GLUCOPHAGE  Take 1000 mg each morning and 500 mg each evening     OVER THE COUNTER MEDICATION  Centrum-Muli. Vitamin-Take 2 gummies every day.     REGRANEX 0.01 % gel  Generic drug:  becaplermin  AS DIRECTED     V-GO 30 Kit  Use one per day     vitamin C 500 MG tablet  Commonly known as:  ASCORBIC ACID  Take 1,000 mg by mouth daily.     Zinc 50 MG Tabs  Take 1 tablet by mouth daily.        Allergies:  Allergies  Allergen Reactions  . Ativan [Lorazepam] Other (See Comments)    Abnormal behavior  . Darvocet [Propoxyphene N-Acetaminophen] Itching  . Demerol [Meperidine] Other (See Comments)    Abnormal behavior  . Latex Itching  . Lithium Nausea And Vomiting    Can not keep this medication down. It makes her terribly ill.  . Oxycodone Other (See Comments)    Abnormal behavior    Past Medical History  Diagnosis Date  . Bipolar 1 disorder ([place])   . Hypertension     past hx of  . H/O hiatal hernia   . Numbness and tingling  Hx; of in B/LLE and B/LUE  . Hyperlipidemia   . Diabetes mellitus     INSULIN  DEPENDENT  . Diabetic neuropathy ([place])   . Diabetic neuropathy ([place])   . Cellulitis and abscess of foot 03/08/2015  . Other iron deficiency anemias 09/20/2015  . Erythropoietin deficiency anemia 09/20/2015    Past Surgical History  Procedure Laterality Date  . Breast surgery    . Abdominal hysterectomy    . Nasal septum surgery    . Amputation  10/24/2011    Procedure: AMPUTATION RAY;  Surgeon: Newt Minion, MD;  Location: [facility];  Service: Orthopedics;  Laterality: Right;  Right Foot 3rd Ray Amputation  . Tonsillectomy    . Adenoidectomy      Hx: of  . Amputation Right 12/16/2012    Procedure: Right Foot Transmetatarsal Amputation;  Surgeon: Newt Minion, MD;  Location: [facility];  Service: Orthopedics;  Laterality: Right;  . Bladder tact   2002  . Colon surgery    . Amputation Left 03/09/2015    Procedure: LEFT FOOT 1ST RAY AMPUTATION;  Surgeon: Newt Minion, MD;  Location: [facility];  Service: Orthopedics;  Laterality: Left;    Family History  Problem Relation Age of Onset  . Diabetes Mother   . Mental illness Mother   . Hypertension Father   . Diabetes Maternal Grandmother   . Heart disease Neg Hx     Social History:  reports that she has never smoked. She has never used smokeless tobacco. She reports that she does not drink alcohol or use illicit drugs.    Review of Systems   DIABETIC foot ulcers with history of amputations: She is being followed at the wound Center with bilateral ulcers  She is complaining of extra sensitivity of her feet when trying to stand or walk.  Feels like she is walking on rocks Has been on gabapentin and analgesics from PCP Could not tolerate Cymbalta   Lipid history: On treatment with Lipitor for about a year with the following results    Lab Results  Component Value Date   CHOL 103 03/09/2015   HDL 27* 03/09/2015   LDLCALC 57 03/09/2015   TRIG 96 03/09/2015   CHOLHDL 3.8 03/09/2015          Last diabetic foot exam was in 03/2015, she  does have History of neuropathy with sensory loss    Physical Examination:  BP 124/68 mmHg  Pulse 72  Temp(Src) 97.9 F (36.6 C)  Resp 14  Ht _0  (1.575 m)  Wt 166 lb 6.4 oz (75.479 kg)  BMI 30.43 kg/m2  SpO2 96%  Exam not indicated     ASSESSMENT:  Diabetes type 2, uncontrolled    She previously has had persistently poor control of her diabetes for several years and multiple complications. See history of present illness for detailed discussion of his current management, blood sugar patterns and problems identified  With using the V-go pump and 30 units basal and relatively small doses at mealtimes her blood sugars have been improving Although her A1c is slightly higher at 7.5 this has partly been related to other intercurrent illnesses and not being able to consistently use the pump with her hyperbaric oxygen treatment More recently blood sugars are settling down and near normal both morning and late evening She does not check any of readings after breakfast and lunch however She has done some extra insulin for higher carbohydrate and larger meals at suppertime However does  not have knowledge about sick day rules  NEUROPATHY with hyperesthesia on her feet: Not getting relief with gabapentin and has difficulty walking  PLAN:    She will continue the same regimen  Discussed sick day management  For her neuropathy she can try OTC capsaicin on the soles of her feet  Continue metformin  More blood sugars after meals at various times about 2 hours later  Patient Instructions  Try Capsaicin cream 3x daily        Counseling time on subjects discussed above is over 50% of today's 25 minute visit  Vaden Becherer 09/20/2015, 9:20 PM   Note: This office note was prepared with Dragon voice recognition system technology. Any transcriptional errors that result from this process are unintentional.

## 2015-09-20 NOTE — Progress Notes (Signed)
IReferral MD  Reason for Referral: Multifactorial anemia -iron deficiency, erythropoietin deficiency, poorly controlled diabetes   Chief Complaint  Patient presents with  . Other    New Patient  : My Blood is low.  HPI:  Dana [name] is a very nice 60 year old white female who looks a lot older. She has multiple medical problems. Her BX problem is diabetes. She has insulin-dependent diabetes. She's had this for 15 years. She has already lost some toes. She has bandages on her ankles bilaterally because of ulcers.  She is on quite a few medications. She has other health problems.  She has been noted to have more anemia. Going back to October 2016, her hemoglobin was 8. Her platelet count was elevated at 452. She and MCV of 82.  In March of this she, her CBC showed a white cell count 7.1. Hemoglobin 8.8. Platelet count 462,000. Her MCV was only 75. Show normal white cell differential.  She has iron deficiency. She had iron studies done. Her iron saturation was only 12%. Her total iron was 38. A much or if a ferritin was sent.  She has some kidney issues. Her blood sugars are not that well controlled.  She was referred to the [facility] to try to help with her anemia.  She's had no bleeding. She is not a vegetarian. She has had little surgeries. She is postmenopausal.  She does have her colonoscopy which was about 3 years ago. She has routine mammograms.  No one in the family has anemia.  She does not smoke. She does not drink.   Overall, her performance status is ECOG 2-3.     Past Medical History  Diagnosis Date  . Bipolar 1 disorder ([place])   . Hypertension     past hx of  . H/O hiatal hernia   . Numbness and tingling     Hx; of in B/LLE and B/LUE  . Hyperlipidemia   . Diabetes mellitus     INSULIN DEPENDENT  . Diabetic neuropathy ([place])   . Diabetic neuropathy ([place])   . Cellulitis and abscess of foot 03/08/2015  :  Past Surgical History  Procedure  Laterality Date  . Breast surgery    . Abdominal hysterectomy    . Nasal septum surgery    . Amputation  10/24/2011    Procedure: AMPUTATION RAY;  Surgeon: Newt Minion, MD;  Location: [facility];  Service: Orthopedics;  Laterality: Right;  Right Foot 3rd Ray Amputation  . Tonsillectomy    . Adenoidectomy      Hx: of  . Amputation Right 12/16/2012    Procedure: Right Foot Transmetatarsal Amputation;  Surgeon: Newt Minion, MD;  Location: [facility];  Service: Orthopedics;  Laterality: Right;  . Bladder tact   2002  . Colon surgery    . Amputation Left 03/09/2015    Procedure: LEFT FOOT 1ST RAY AMPUTATION;  Surgeon: Newt Minion, MD;  Location: [facility];  Service: Orthopedics;  Laterality: Left;  :   Current outpatient prescriptions:  .  aspirin 81 MG tablet, Take 81 mg by mouth daily., Disp: , Rfl:  .  atorvastatin (LIPITOR) 20 MG tablet, Take 1 tablet (20 mg total) by mouth at bedtime., Disp: 30 tablet, Rfl: 5 .  Blood Glucose Monitoring Suppl (FREESTYLE LITE) DEVI, Use to check blood sugar 3 times a day dx code E11.39, Disp: 1 each, Rfl: 0 .  escitalopram (LEXAPRO) 10 MG tablet, Take 1 tablet by mouth daily., Disp: ,  Rfl:  .  gabapentin (NEURONTIN) 600 MG tablet, Take 2 tablets (1,200 mg total) by mouth 3 (three) times daily., Disp: 180 tablet, Rfl: 3 .  glucose blood (FREESTYLE LITE) test strip, Use as instructed to check blood sugar 3 times a day dx code E11.39, Disp: 100 each, Rfl: 3 .  Insulin Disposable Pump (V-GO 30) KIT, Use one per day, Disp: 1 kit, Rfl: 2 .  insulin regular (NOVOLIN R RELION) 100 units/mL injection, Take 6 units with breakfast, 8 units with lunch and 8 units with dinner (Patient taking differently: takes 68 units with V-go per day)), Disp: 10 mL, Rfl: 3 .  Lancets (FREESTYLE) lancets, Use as instructed to check blood sugar 3 times per day dx code E11.39, Disp: 100 each, Rfl: 3 .  metFORMIN (GLUCOPHAGE) 500 MG tablet, Take 1000 mg each morning and 500 mg each evening, Disp:  90 tablet, Rfl: 5 .  OVER THE COUNTER MEDICATION, Centrum-Muli. Vitamin-Take 2 gummies every day., Disp: , Rfl:  .  REGRANEX 0.01 % gel, AS DIRECTED, Disp: , Rfl:  .  vitamin C (ASCORBIC ACID) 500 MG tablet, Take 1,000 mg by mouth daily., Disp: , Rfl:  .  Zinc 50 MG TABS, Take 1 tablet by mouth daily., Disp: , Rfl: :  :  Allergies  Allergen Reactions  . Ativan [Lorazepam] Other (See Comments)    Abnormal behavior  . Darvocet [Propoxyphene N-Acetaminophen] Itching  . Demerol [Meperidine] Other (See Comments)    Abnormal behavior  . Latex Itching  . Lithium Nausea And Vomiting    Can not keep this medication down. It makes her terribly ill.  . Oxycodone Other (See Comments)    Abnormal behavior  :  Family History  Problem Relation Age of Onset  . Diabetes Mother   . Mental illness Mother   . Hypertension Father   . Diabetes Maternal Grandmother   . Heart disease Neg Hx   :  Social History   Social History  . Marital Status: Married    Spouse Name: N/A  . Number of Children: N/A  . Years of Education: N/A   Occupational History  . Not on file.   Social History Main Topics  . Smoking status: Never Smoker   . Smokeless tobacco: Never Used  . Alcohol Use: No  . Drug Use: No  . Sexual Activity: Not on file   Other Topics Concern  . Not on file   Social History Narrative  :  Pertinent items are noted in HPI.  Exam: '@[facility]' @ [facility]-appearing white female in no obvious distress. Vital/a temperature of 97 7. Pulse 69. Blood pressure 116/74. Her weight is 166 pounds. Hent exam shows no ocular or oral lesions. Conjunctiva is pale. She has no mucositis. There is no adenopathy on her neck. Thyroid is nonpalpable. Lungs are clear bilaterally. Cardiac exam regular rate and rhythm with no murmurs, rubs or bruits. Abdomen is soft. She is mildly obese she has good bowel sounds. There is no fluid wave. There is no palpable liver or spleen tip. Breast exam shows no  breast masses bilaterally. There is no bilateral axillary adenopathy. Extremities shows no clubbing, cyanosis or edema. She has bandages around her ankles bilaterally. She is missing the first toe of her right foot. She is missing 3 toes of her left foot. Skin exam shows some scattered ecchymoses. There is no petechia. Neurological exam is nonfocal.    Recent Labs  09/20/15 1344  WBC 6.0  HGB 7.4*  HCT  24.1*  PLT 384    Recent Labs  09/20/15 1344  NA 138  K 4.7  CL 99  CO2 30  GLUCOSE 186*  BUN 21  CREATININE 1.0  CALCIUM 9.4    Blood smear review:  Hypochromic and   Pathology:     Assessment and Plan:  Dana [name] has multi-factorial anrmia.  i suspect that she has iron def.  I suspect epo deficiency.  She has poorly controlled diabetes.  I am worried that she nmay need a blood transfusion but she does not think that she does.  We will see what her labs show.  i will get her back fir IV iron and possibly ESA administration.  I spent 45 minutes with her.

## 2015-09-20 NOTE — Patient Instructions (Signed)
Try Capsaicin cream 3x daily

## 2015-09-21 LAB — IRON AND TIBC
%SAT: 8 % — AB (ref 21–57)
IRON: 22 ug/dL — AB (ref 41–142)
TIBC: 286 ug/dL (ref 236–444)
UIBC: 264 ug/dL (ref 120–384)

## 2015-09-21 LAB — RETICULOCYTES: Reticulocyte Count: 1.3 % (ref 0.6–2.6)

## 2015-09-21 LAB — ERYTHROPOIETIN: ERYTHROPOIETIN: 55.8 m[IU]/mL — AB (ref 2.6–18.5)

## 2015-09-21 LAB — FERRITIN: Ferritin: 69 ng/ml (ref 9–269)

## 2015-09-21 LAB — LACTATE DEHYDROGENASE: LDH: 176 U/L (ref 125–245)

## 2015-09-21 NOTE — Telephone Encounter (Signed)
LMOM with contact name and number for return call RE: medication management w/new Rx suggested by provider/SLS 05/12 [2nd message]

## 2015-09-24 ENCOUNTER — Telehealth: Payer: Self-pay | Admitting: Physician Assistant

## 2015-09-24 DIAGNOSIS — E11621 Type 2 diabetes mellitus with foot ulcer: Secondary | ICD-10-CM | POA: Diagnosis not present

## 2015-09-24 DIAGNOSIS — L97513 Non-pressure chronic ulcer of other part of right foot with necrosis of muscle: Secondary | ICD-10-CM | POA: Diagnosis not present

## 2015-09-24 DIAGNOSIS — M86672 Other chronic osteomyelitis, left ankle and foot: Secondary | ICD-10-CM | POA: Diagnosis not present

## 2015-09-24 LAB — HEMOGLOBINOPATHY EVALUATION
HGB C: 0 %
HGB S: 0 %
Hemoglobin A2 Quantitation: 1.8 % (ref 0.7–3.1)
Hemoglobin F Quantitation: 0 % (ref 0.0–2.0)
Hgb A: 98.2 % — ABNORMAL HIGH (ref 94.0–98.0)

## 2015-09-24 NOTE — Telephone Encounter (Signed)
Dana [name] at 09/24/2015 1:20 PM     Status: Signed       Expand All Collapse All   Can be reached: [phone]  Reason for call: Pt is having numbness in feet that has been ongoing for a long time. She said that she has seen you and the doctor at the wound center but she does not have relief. She has a new surgical shoe that isn't helping. She has been taking 3 ibuprofen every 8 hours. She said she's tried other meds as well. She is asking if she should have referral to neuro or foot specialist. Pt requesting call for advice.

## 2015-09-24 NOTE — Telephone Encounter (Signed)
Duplicate note

## 2015-09-24 NOTE — Telephone Encounter (Signed)
Can be reached: [phone]  Reason for call: Pt is having numbness in feet that has been ongoing for a long time. She said that she has seen you and the doctor at the wound center but she does not have relief. She has a new surgical shoe that isn't helping. She has been taking 3 ibuprofen every 8 hours. She said she's tried other meds as well. She is asking if she should have referral to neuro or foot specialist. Pt requesting call for advice.

## 2015-09-24 NOTE — Telephone Encounter (Signed)
LMOM with contact name and number for return call RE: Referral to Pain Mgt per further provider instructions/SLS 05/15

## 2015-09-24 NOTE — Telephone Encounter (Signed)
I would honestly recommend a pain management specialist giving the number of medications tried previously. If she is ok with this I will place referral.

## 2015-09-25 DIAGNOSIS — S91329A Laceration with foreign body, unspecified foot, initial encounter: Secondary | ICD-10-CM | POA: Diagnosis not present

## 2015-09-25 DIAGNOSIS — M86672 Other chronic osteomyelitis, left ankle and foot: Secondary | ICD-10-CM | POA: Diagnosis not present

## 2015-09-25 DIAGNOSIS — E11621 Type 2 diabetes mellitus with foot ulcer: Secondary | ICD-10-CM | POA: Diagnosis not present

## 2015-09-25 DIAGNOSIS — L97513 Non-pressure chronic ulcer of other part of right foot with necrosis of muscle: Secondary | ICD-10-CM | POA: Diagnosis not present

## 2015-09-25 NOTE — Telephone Encounter (Signed)
Duplicate note

## 2015-09-25 NOTE — Telephone Encounter (Signed)
Dana [name] at 09/25/2015 1:14 PM     Status: Signed       Expand All Collapse All   Pt Called in. She would like for PCP to go ahead with pain management referral.

## 2015-09-25 NOTE — Telephone Encounter (Signed)
Pt  Called in. She would like for PCP to go ahead with pain management referral.

## 2015-09-25 NOTE — Telephone Encounter (Signed)
Referral placed.

## 2015-09-26 ENCOUNTER — Other Ambulatory Visit: Payer: Self-pay | Admitting: Hematology & Oncology

## 2015-09-26 ENCOUNTER — Encounter: Payer: Self-pay | Admitting: Hematology & Oncology

## 2015-09-26 DIAGNOSIS — D508 Other iron deficiency anemias: Secondary | ICD-10-CM

## 2015-09-26 DIAGNOSIS — L97513 Non-pressure chronic ulcer of other part of right foot with necrosis of muscle: Secondary | ICD-10-CM | POA: Diagnosis not present

## 2015-09-26 DIAGNOSIS — K909 Intestinal malabsorption, unspecified: Secondary | ICD-10-CM | POA: Insufficient documentation

## 2015-09-26 DIAGNOSIS — M86672 Other chronic osteomyelitis, left ankle and foot: Secondary | ICD-10-CM | POA: Diagnosis not present

## 2015-09-26 DIAGNOSIS — E11621 Type 2 diabetes mellitus with foot ulcer: Secondary | ICD-10-CM | POA: Diagnosis not present

## 2015-09-26 HISTORY — DX: Intestinal malabsorption, unspecified: K90.9

## 2015-09-27 DIAGNOSIS — L97512 Non-pressure chronic ulcer of other part of right foot with fat layer exposed: Secondary | ICD-10-CM | POA: Diagnosis not present

## 2015-09-27 DIAGNOSIS — L97422 Non-pressure chronic ulcer of left heel and midfoot with fat layer exposed: Secondary | ICD-10-CM | POA: Diagnosis not present

## 2015-09-27 DIAGNOSIS — L97522 Non-pressure chronic ulcer of other part of left foot with fat layer exposed: Secondary | ICD-10-CM | POA: Diagnosis not present

## 2015-09-27 DIAGNOSIS — S91329A Laceration with foreign body, unspecified foot, initial encounter: Secondary | ICD-10-CM | POA: Diagnosis not present

## 2015-09-27 DIAGNOSIS — L97412 Non-pressure chronic ulcer of right heel and midfoot with fat layer exposed: Secondary | ICD-10-CM | POA: Diagnosis not present

## 2015-09-27 DIAGNOSIS — M86672 Other chronic osteomyelitis, left ankle and foot: Secondary | ICD-10-CM | POA: Diagnosis not present

## 2015-09-27 DIAGNOSIS — E11621 Type 2 diabetes mellitus with foot ulcer: Secondary | ICD-10-CM | POA: Diagnosis not present

## 2015-09-28 DIAGNOSIS — S91329A Laceration with foreign body, unspecified foot, initial encounter: Secondary | ICD-10-CM | POA: Diagnosis not present

## 2015-09-28 LAB — ALPHA-THALASSEMIA GENOTYPR: PDF: 0

## 2015-10-01 DIAGNOSIS — M86672 Other chronic osteomyelitis, left ankle and foot: Secondary | ICD-10-CM | POA: Diagnosis not present

## 2015-10-01 DIAGNOSIS — E11621 Type 2 diabetes mellitus with foot ulcer: Secondary | ICD-10-CM | POA: Diagnosis not present

## 2015-10-01 DIAGNOSIS — L97513 Non-pressure chronic ulcer of other part of right foot with necrosis of muscle: Secondary | ICD-10-CM | POA: Diagnosis not present

## 2015-10-02 DIAGNOSIS — M86672 Other chronic osteomyelitis, left ankle and foot: Secondary | ICD-10-CM | POA: Diagnosis not present

## 2015-10-03 ENCOUNTER — Ambulatory Visit: Payer: PPO

## 2015-10-03 DIAGNOSIS — E11621 Type 2 diabetes mellitus with foot ulcer: Secondary | ICD-10-CM | POA: Diagnosis not present

## 2015-10-03 DIAGNOSIS — M86672 Other chronic osteomyelitis, left ankle and foot: Secondary | ICD-10-CM | POA: Diagnosis not present

## 2015-10-03 DIAGNOSIS — L97513 Non-pressure chronic ulcer of other part of right foot with necrosis of muscle: Secondary | ICD-10-CM | POA: Diagnosis not present

## 2015-10-04 ENCOUNTER — Ambulatory Visit (HOSPITAL_BASED_OUTPATIENT_CLINIC_OR_DEPARTMENT_OTHER): Payer: PPO

## 2015-10-04 VITALS — BP 127/66 | HR 66 | Temp 98.2°F | Resp 16

## 2015-10-04 DIAGNOSIS — D649 Anemia, unspecified: Secondary | ICD-10-CM

## 2015-10-04 DIAGNOSIS — K909 Intestinal malabsorption, unspecified: Secondary | ICD-10-CM

## 2015-10-04 DIAGNOSIS — D631 Anemia in chronic kidney disease: Secondary | ICD-10-CM

## 2015-10-04 DIAGNOSIS — D508 Other iron deficiency anemias: Secondary | ICD-10-CM

## 2015-10-04 MED ORDER — SODIUM CHLORIDE 0.9 % IV SOLN
Freq: Once | INTRAVENOUS | Status: AC
  Administered 2015-10-04: 14:00:00 via INTRAVENOUS

## 2015-10-04 MED ORDER — SODIUM CHLORIDE 0.9 % IV SOLN
510.0000 mg | Freq: Once | INTRAVENOUS | Status: AC
  Administered 2015-10-04: 510 mg via INTRAVENOUS
  Filled 2015-10-04: qty 17

## 2015-10-04 NOTE — Patient Instructions (Signed)

## 2015-10-05 DIAGNOSIS — L97512 Non-pressure chronic ulcer of other part of right foot with fat layer exposed: Secondary | ICD-10-CM | POA: Diagnosis not present

## 2015-10-05 DIAGNOSIS — M86272 Subacute osteomyelitis, left ankle and foot: Secondary | ICD-10-CM | POA: Diagnosis not present

## 2015-10-05 DIAGNOSIS — L97523 Non-pressure chronic ulcer of other part of left foot with necrosis of muscle: Secondary | ICD-10-CM | POA: Diagnosis not present

## 2015-10-05 DIAGNOSIS — E11621 Type 2 diabetes mellitus with foot ulcer: Secondary | ICD-10-CM | POA: Diagnosis not present

## 2015-10-05 DIAGNOSIS — L97412 Non-pressure chronic ulcer of right heel and midfoot with fat layer exposed: Secondary | ICD-10-CM | POA: Diagnosis not present

## 2015-10-05 DIAGNOSIS — L97422 Non-pressure chronic ulcer of left heel and midfoot with fat layer exposed: Secondary | ICD-10-CM | POA: Diagnosis not present

## 2015-10-09 DIAGNOSIS — E11621 Type 2 diabetes mellitus with foot ulcer: Secondary | ICD-10-CM | POA: Diagnosis not present

## 2015-10-09 DIAGNOSIS — L97513 Non-pressure chronic ulcer of other part of right foot with necrosis of muscle: Secondary | ICD-10-CM | POA: Diagnosis not present

## 2015-10-09 DIAGNOSIS — S91329A Laceration with foreign body, unspecified foot, initial encounter: Secondary | ICD-10-CM | POA: Diagnosis not present

## 2015-10-09 DIAGNOSIS — M86672 Other chronic osteomyelitis, left ankle and foot: Secondary | ICD-10-CM | POA: Diagnosis not present

## 2015-10-10 DIAGNOSIS — M86672 Other chronic osteomyelitis, left ankle and foot: Secondary | ICD-10-CM | POA: Diagnosis not present

## 2015-10-10 DIAGNOSIS — E11621 Type 2 diabetes mellitus with foot ulcer: Secondary | ICD-10-CM | POA: Diagnosis not present

## 2015-10-10 DIAGNOSIS — L97513 Non-pressure chronic ulcer of other part of right foot with necrosis of muscle: Secondary | ICD-10-CM | POA: Diagnosis not present

## 2015-10-11 DIAGNOSIS — L97513 Non-pressure chronic ulcer of other part of right foot with necrosis of muscle: Secondary | ICD-10-CM | POA: Diagnosis not present

## 2015-10-11 DIAGNOSIS — M86672 Other chronic osteomyelitis, left ankle and foot: Secondary | ICD-10-CM | POA: Diagnosis not present

## 2015-10-11 DIAGNOSIS — E11621 Type 2 diabetes mellitus with foot ulcer: Secondary | ICD-10-CM | POA: Diagnosis not present

## 2015-10-12 ENCOUNTER — Ambulatory Visit (HOSPITAL_BASED_OUTPATIENT_CLINIC_OR_DEPARTMENT_OTHER): Payer: PPO

## 2015-10-12 VITALS — BP 95/56 | HR 70 | Temp 98.0°F | Resp 16

## 2015-10-12 DIAGNOSIS — M86672 Other chronic osteomyelitis, left ankle and foot: Secondary | ICD-10-CM | POA: Diagnosis not present

## 2015-10-12 DIAGNOSIS — L97422 Non-pressure chronic ulcer of left heel and midfoot with fat layer exposed: Secondary | ICD-10-CM | POA: Diagnosis not present

## 2015-10-12 DIAGNOSIS — L97512 Non-pressure chronic ulcer of other part of right foot with fat layer exposed: Secondary | ICD-10-CM | POA: Diagnosis not present

## 2015-10-12 DIAGNOSIS — D649 Anemia, unspecified: Secondary | ICD-10-CM | POA: Diagnosis not present

## 2015-10-12 DIAGNOSIS — L97523 Non-pressure chronic ulcer of other part of left foot with necrosis of muscle: Secondary | ICD-10-CM | POA: Diagnosis not present

## 2015-10-12 DIAGNOSIS — E11621 Type 2 diabetes mellitus with foot ulcer: Secondary | ICD-10-CM | POA: Diagnosis not present

## 2015-10-12 DIAGNOSIS — D631 Anemia in chronic kidney disease: Secondary | ICD-10-CM

## 2015-10-12 DIAGNOSIS — L97412 Non-pressure chronic ulcer of right heel and midfoot with fat layer exposed: Secondary | ICD-10-CM | POA: Diagnosis not present

## 2015-10-12 DIAGNOSIS — D508 Other iron deficiency anemias: Secondary | ICD-10-CM

## 2015-10-12 DIAGNOSIS — K909 Intestinal malabsorption, unspecified: Secondary | ICD-10-CM

## 2015-10-12 MED ORDER — SODIUM CHLORIDE 0.9 % IV SOLN
Freq: Once | INTRAVENOUS | Status: AC
Start: 2015-10-12 — End: 2015-10-12
  Administered 2015-10-12: 14:00:00 via INTRAVENOUS

## 2015-10-12 MED ORDER — SODIUM CHLORIDE 0.9 % IV SOLN
510.0000 mg | Freq: Once | INTRAVENOUS | Status: AC
  Administered 2015-10-12: 510 mg via INTRAVENOUS
  Filled 2015-10-12: qty 17

## 2015-10-12 NOTE — Patient Instructions (Signed)

## 2015-10-15 DIAGNOSIS — E11621 Type 2 diabetes mellitus with foot ulcer: Secondary | ICD-10-CM | POA: Diagnosis not present

## 2015-10-15 DIAGNOSIS — L97513 Non-pressure chronic ulcer of other part of right foot with necrosis of muscle: Secondary | ICD-10-CM | POA: Diagnosis not present

## 2015-10-15 DIAGNOSIS — M86672 Other chronic osteomyelitis, left ankle and foot: Secondary | ICD-10-CM | POA: Diagnosis not present

## 2015-10-15 DIAGNOSIS — L97422 Non-pressure chronic ulcer of left heel and midfoot with fat layer exposed: Secondary | ICD-10-CM | POA: Diagnosis not present

## 2015-10-15 DIAGNOSIS — L97412 Non-pressure chronic ulcer of right heel and midfoot with fat layer exposed: Secondary | ICD-10-CM | POA: Diagnosis not present

## 2015-10-16 DIAGNOSIS — E11621 Type 2 diabetes mellitus with foot ulcer: Secondary | ICD-10-CM | POA: Diagnosis not present

## 2015-10-16 DIAGNOSIS — M86672 Other chronic osteomyelitis, left ankle and foot: Secondary | ICD-10-CM | POA: Diagnosis not present

## 2015-10-16 DIAGNOSIS — L97513 Non-pressure chronic ulcer of other part of right foot with necrosis of muscle: Secondary | ICD-10-CM | POA: Diagnosis not present

## 2015-10-17 ENCOUNTER — Encounter: Payer: Self-pay | Admitting: Physician Assistant

## 2015-10-17 ENCOUNTER — Ambulatory Visit (INDEPENDENT_AMBULATORY_CARE_PROVIDER_SITE_OTHER): Payer: PPO | Admitting: Physician Assistant

## 2015-10-17 VITALS — BP 103/68 | HR 67 | Temp 97.7°F | Resp 16 | Ht 62.0 in | Wt 171.1 lb

## 2015-10-17 DIAGNOSIS — E1142 Type 2 diabetes mellitus with diabetic polyneuropathy: Secondary | ICD-10-CM

## 2015-10-17 MED ORDER — NORTRIPTYLINE HCL 10 MG PO CAPS: 10.0000 mg | ORAL_CAPSULE | Freq: Every day | ORAL | Status: DC

## 2015-10-17 NOTE — Patient Instructions (Signed)
Please continue chronic medications as directed. Start the [employer] as directed to help with pain.  Let me know how this is working over the next few weeks.  Follow-up with specialists as scheduled.  FU with me in 3 months.

## 2015-10-17 NOTE — Progress Notes (Signed)
Patient presents to clinic today for follow-up of chronic pain secondary to diabetic neuropathy and phantom limb pain. Patient is on an almost max dose of gabapentin daily. Still noting moderate pain, worse at night. Cannot tolerate narcotic pain medication due to side effects. Have previously discussed addition of TCA for pain. Patient would like to consider. Also would like to talk about supplements that may be beneficial.  Past Medical History  Diagnosis Date  . Bipolar 1 disorder ([place])   . Hypertension     past hx of  . H/O hiatal hernia   . Numbness and tingling     Hx; of in B/LLE and B/LUE  . Hyperlipidemia   . Diabetes mellitus     INSULIN DEPENDENT  . Diabetic neuropathy ([place])   . Diabetic neuropathy ([place])   . Cellulitis and abscess of foot 03/08/2015  . Other iron deficiency anemias 09/20/2015  . 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 Iron malabsorption 09/26/2015    Current Outpatient Prescriptions on File Prior to Visit  Medication Sig Dispense Refill  . aspirin 81 MG tablet Take 81 mg by mouth daily.    Marland Kitchen atorvastatin (LIPITOR) 20 MG tablet Take 1 tablet (20 mg total) by mouth at bedtime. 30 tablet 5  . Blood Glucose Monitoring Suppl (FREESTYLE LITE) DEVI Use to check blood sugar 3 times a day dx code E11.39 1 each 0  . escitalopram (LEXAPRO) 10 MG tablet Take 1 tablet by mouth daily.    Marland Kitchen gabapentin (NEURONTIN) 600 MG tablet Take 2 tablets (1,200 mg total) by mouth 3 (three) times daily. 180 tablet 3  . glucose blood (FREESTYLE LITE) test strip Use as instructed to check blood sugar 3 times a day dx code E11.39 100 each 3  . Insulin Disposable Pump (V-GO 30) KIT Use one per day 1 kit 2  . insulin regular (NOVOLIN R RELION) 100 units/mL injection Take 6 units with breakfast, 8 units with lunch and 8 units with dinner (Patient taking differently: takes 68 units with V-go per day)) 10 mL 3  . Lancets (FREESTYLE) lancets Use as instructed to check blood sugar 3 times  per day dx code E11.39 100 each 3  . metFORMIN (GLUCOPHAGE) 500 MG tablet Take 1000 mg each morning and 500 mg each evening 90 tablet 5  . OVER THE COUNTER MEDICATION Centrum-Muli. Vitamin-Take 2 gummies every day.    Marland Kitchen REGRANEX 0.01 % gel AS DIRECTED     No current facility-administered medications on file prior to visit.    Allergies  Allergen Reactions  . Ativan [Lorazepam] Other (See Comments)    Abnormal behavior  . Darvocet [Propoxyphene N-Acetaminophen] Itching  . Demerol [Meperidine] Other (See Comments)    Abnormal behavior  . Latex Itching  . Lithium Nausea And Vomiting    Can not keep this medication down. It makes her terribly ill.  . Oxycodone Other (See Comments)    Abnormal behavior    Family History  Problem Relation Age of Onset  . Diabetes Mother   . Mental illness Mother   . Hypertension Father   . Diabetes Maternal Grandmother   . Heart disease Neg Hx     Social History   Social History  . Marital Status: Married    Spouse Name: N/A  . Number of Children: N/A  . Years of Education: N/A   Social History Main Topics  . Smoking status: Never Smoker   . Smokeless tobacco: Never Used  . Alcohol Use: No  .  Drug Use: No  . Sexual Activity: Not Asked   Other Topics Concern  . None   Social History Narrative    Review of Systems - See HPI.  All other ROS are negative.  BP 103/68 mmHg  Pulse 67  Temp(Src) 97.7 F (36.5 C) (Oral)  Resp 16  Ht 5' 2" (1.575 m)  Wt 171 lb 2 oz (77.622 kg)  BMI 31.29 kg/m2  SpO2 98%  Physical Exam  Constitutional: She is oriented to person, place, and time and well-developed, well-nourished, and in no distress.  HENT:  Head: Normocephalic and atraumatic.  Eyes: Conjunctivae are normal.  Cardiovascular: Normal rate, regular rhythm, normal heart sounds and intact distal pulses.   Pulmonary/Chest: Effort normal.  Neurological: She is oriented to person, place, and time.  Skin: Skin is warm and dry.  Vitals  reviewed.   Recent Results (from the past 2160 hour(s))  Lipase, blood     Status: None   Collection Time: 08/21/15  7:25 PM  Result Value Ref Range   Lipase 17 11 - 51 U/L  Comprehensive metabolic panel     Status: Abnormal   Collection Time: 08/21/15  7:25 PM  Result Value Ref Range   Sodium 137 135 - 145 mmol/L   Potassium 4.1 3.5 - 5.1 mmol/L   Chloride 102 101 - 111 mmol/L   CO2 27 22 - 32 mmol/L   Glucose, Bld 250 (H) 65 - 99 mg/dL   BUN 37 (H) 6 - 20 mg/dL   Creatinine, Ser 1.02 (H) 0.44 - 1.00 mg/dL   Calcium 8.9 8.9 - 10.3 mg/dL   Total Protein 8.0 6.5 - 8.1 g/dL   Albumin 3.5 3.5 - 5.0 g/dL   AST 14 (L) 15 - 41 U/L   ALT 13 (L) 14 - 54 U/L   Alkaline Phosphatase 113 38 - 126 U/L   Total Bilirubin 1.0 0.3 - 1.2 mg/dL   GFR calc non Af Amer 59 (L) >60 mL/min   GFR calc Af Amer >60 >60 mL/min    Comment: (NOTE) The eGFR has been calculated using the CKD EPI equation. This calculation has not been validated in all clinical situations. eGFR's persistently <60 mL/min signify possible Chronic Kidney Disease.    Anion gap 8 5 - 15  CBC     Status: Abnormal   Collection Time: 08/21/15  7:25 PM  Result Value Ref Range   WBC 11.8 (H) 4.0 - 10.5 K/uL   RBC 3.75 (L) 3.87 - 5.11 MIL/uL   Hemoglobin 9.3 (L) 12.0 - 15.0 g/dL   HCT 30.0 (L) 36.0 - 46.0 %   MCV 80.0 78.0 - 100.0 fL   MCH 24.8 (L) 26.0 - 34.0 pg   MCHC 31.0 30.0 - 36.0 g/dL   RDW 17.5 (H) 11.5 - 15.5 %   Platelets 347 150 - 400 K/uL  Comprehensive metabolic panel     Status: Abnormal   Collection Time: 08/31/15  1:49 PM  Result Value Ref Range   Sodium 137 135 - 145 mEq/L   Potassium 5.0 3.5 - 5.1 mEq/L   Chloride 101 96 - 112 mEq/L   CO2 29 19 - 32 mEq/L   Glucose, Bld 236 (H) 70 - 99 mg/dL   BUN 22 6 - 23 mg/dL   Creatinine, Ser 0.76 0.40 - 1.20 mg/dL   Total Bilirubin 0.3 0.2 - 1.2 mg/dL   Alkaline Phosphatase 138 (H) 39 - 117 U/L   AST 11 0 - 37  U/L   ALT 15 0 - 35 U/L   Total Protein 7.4 6.0 -  8.3 g/dL   Albumin 3.0 (L) 3.5 - 5.2 g/dL   Calcium 9.3 8.4 - 10.5 mg/dL   GFR 82.90 >60.00 mL/min  CBC with Differential [facility])     Status: Abnormal   Collection Time: 09/20/15  1:44 PM  Result Value Ref Range   WBC 6.0 3.9 - 10.0 10e3/uL   RBC 2.95 (L) 3.70 - 5.32 10e6/uL   HGB 7.4 (L) 11.6 - 15.9 g/dL   HCT 24.1 (L) 34.8 - 46.6 %   MCV 82 81 - 101 fL   MCH 25.1 (L) 26.0 - 34.0 pg   MCHC 30.7 (L) 32.0 - 36.0 g/dL   RDW 17.5 (H) 11.1 - 15.7 %   Platelets 384 145 - 400 10e3/uL   NEUT# 2.8 1.5 - 6.5 10e3/uL   LYMPH# 2.3 0.9 - 3.3 10e3/uL   MONO# 0.5 0.1 - 0.9 10e3/uL   Eosinophils Absolute 0.3 0.0 - 0.5 10e3/uL   BASO# 0.0 0.0 - 0.2 10e3/uL   NEUT% 47.6 39.6 - 80.0 %   LYMPH% 38.2 14.0 - 48.0 %   MONO% 8.7 0.0 - 13.0 %   EOS% 5.0 0.0 - 7.0 %   BASO% 0.5 0.0 - 2.0 %  [facility] - Smear     Status: None   Collection Time: 09/20/15  1:44 PM  Result Value Ref Range   Smear Result Smear Available   CMP STAT ([facility] only)     Status: Abnormal   Collection Time: 09/20/15  1:44 PM  Result Value Ref Range   Sodium 138 128 - 145 mEq/L   Potassium 4.7 3.3 - 4.7 mEq/L   Chloride 99 98 - 108 mEq/L   CO2 30 18 - 33 mEq/L   Glucose, Bld 186 (H) 73 - 118 mg/dL   BUN, Bld 21 7 - 22 mg/dL   Creat 1.0 0.6 - 1.2 mg/dl   Total Bilirubin 0.50 0.20 - 1.60 mg/dl   Alkaline Phosphatase 97 (H) 26 - 84 U/L   AST 15 11 - 38 U/L   ALT(SGPT) 23 10 - 47 U/L   Total Protein 7.3 6.4 - 8.1 g/dL   Albumin 2.9 (L) 3.3 - 5.5 g/dL   Calcium 9.4 8.0 - 10.3 mg/dL  Erythropoietin     Status: Abnormal   Collection Time: 09/20/15  1:44 PM  Result Value Ref Range   Erythropoietin 55.8 (H) 2.6 - 18.5 mIU/mL  Hemoglobinopathy evaluation     Status: Abnormal   Collection Time: 09/20/15  1:44 PM  Result Value Ref Range   HEMOGLOBIN F QUANTITATION 0.0 0.0 - 2.0 %   Hgb A 98.2 (H) 94.0 - 98.0 %   HGB S 0.0 0.0 %   HGB C 0.0 0.0 %   HEMOGLOBIN A2 QUANTITATION 1.8 0.7 - 3.1 %    HGB INTERPRETATION Comment     Comment: Normal adult hemoglobin present.  Reticulocytes     Status: None   Collection Time: 09/20/15  1:44 PM  Result Value Ref Range   Reticulocyte Count 1.3 0.6 - 2.6 %  Alpha-Thalassemia GenotypR     Status: None   Collection Time: 09/20/15  1:44 PM  Result Value Ref Range   Alpha-Thalassemia Comment     Comment: Molecular analysis report has been mailed. Results for this test are for research purposes only by the assay's manufacturer.  The performance characteristics of  this product have not been established.  Results should not be used as a diagnostic procedure without confirmation of the diagnosis by another medically established diagnostic product or procedure.    PDF .   Lactate dehydrogenase     Status: None   Collection Time: 09/20/15  1:44 PM  Result Value Ref Range   LDH 176 125 - 245 U/L  Ferritin     Status: None   Collection Time: 09/20/15  1:45 PM  Result Value Ref Range   Ferritin 69 9 - 269 ng/ml  Iron and TIBC     Status: Abnormal   Collection Time: 09/20/15  1:45 PM  Result Value Ref Range   Iron 22 (L) 41 - 142 ug/dL   TIBC 286 236 - 444 ug/dL   UIBC 264 120 - 384 ug/dL   %SAT 8 (L) 21 - 57 %  POCT HgB A1C     Status: Abnormal   Collection Time: 09/20/15  4:13 PM  Result Value Ref Range   Hemoglobin A1C 7.5     Assessment/Plan: 1. Diabetic polyneuropathy associated with type 2 diabetes mellitus (HCC) Continue Gabapentin. Will continue iron infusions with hematology as low iron can contributing to neuropathy. Will start B complex multivitamin. Will also begin Pamelor 10 mg daily. She is to FU with Endocrinology as scheduled for DM II.   [name], PA-C

## 2015-10-17 NOTE — Progress Notes (Deleted)
History of Present Illness: Patient is a 60 y.o. female who presents to clinic today for follow-up of Diabetes Mellitus II, ***.  Patient currently on medication regimen of ***.  *** taking medications as directed. Endorses ***.  Denies ***. *** checking blood glucose as directed. ***.   Latest Maintenance: A1C --  Lab Results  Component Value Date   HGBA1C 7.5 09/20/2015   Diabetic Eye Exam -- *** Urine Microalbumin -- *** Foot Exam -- ***  Past Medical History  Diagnosis Date  . Bipolar 1 disorder ([place])   . Hypertension     past hx of  . H/O hiatal hernia   . Numbness and tingling     Hx; of in B/LLE and B/LUE  . Hyperlipidemia   . Diabetes mellitus     INSULIN DEPENDENT  . Diabetic neuropathy ([place])   . Diabetic neuropathy ([place])   . Cellulitis and abscess of foot 03/08/2015  . Other iron deficiency anemias 09/20/2015  . 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 Iron malabsorption 09/26/2015    Current Outpatient Prescriptions on File Prior to Visit  Medication Sig Dispense Refill  . aspirin 81 MG tablet Take 81 mg by mouth daily.    Marland Kitchen atorvastatin (LIPITOR) 20 MG tablet Take 1 tablet (20 mg total) by mouth at bedtime. 30 tablet 5  . Blood Glucose Monitoring Suppl (FREESTYLE LITE) DEVI Use to check blood sugar 3 times a day dx code E11.39 1 each 0  . escitalopram (LEXAPRO) 10 MG tablet Take 1 tablet by mouth daily.    Marland Kitchen gabapentin (NEURONTIN) 600 MG tablet Take 2 tablets (1,200 mg total) by mouth 3 (three) times daily. 180 tablet 3  . glucose blood (FREESTYLE LITE) test strip Use as instructed to check blood sugar 3 times a day dx code E11.39 100 each 3  . Insulin Disposable Pump (V-GO 30) KIT Use one per day 1 kit 2  . insulin regular (NOVOLIN R RELION) 100 units/mL injection Take 6 units with breakfast, 8 units with lunch and 8 units with dinner (Patient taking differently: takes 68 units with V-go per day)) 10 mL 3  . Lancets (FREESTYLE) lancets Use as instructed  to check blood sugar 3 times per day dx code E11.39 100 each 3  . metFORMIN (GLUCOPHAGE) 500 MG tablet Take 1000 mg each morning and 500 mg each evening 90 tablet 5  . OVER THE COUNTER MEDICATION Centrum-Muli. Vitamin-Take 2 gummies every day.    Marland Kitchen REGRANEX 0.01 % gel AS DIRECTED     No current facility-administered medications on file prior to visit.    Allergies  Allergen Reactions  . Ativan [Lorazepam] Other (See Comments)    Abnormal behavior  . Darvocet [Propoxyphene N-Acetaminophen] Itching  . Demerol [Meperidine] Other (See Comments)    Abnormal behavior  . Latex Itching  . Lithium Nausea And Vomiting    Can not keep this medication down. It makes her terribly ill.  . Oxycodone Other (See Comments)    Abnormal behavior    Family History  Problem Relation Age of Onset  . Diabetes Mother   . Mental illness Mother   . Hypertension Father   . Diabetes Maternal Grandmother   . Heart disease Neg Hx     Social History   Social History  . Marital Status: Married    Spouse Name: N/A  . Number of Children: N/A  . Years of Education: N/A   Social History Main Topics  . Smoking status: Never  Smoker   . Smokeless tobacco: Never Used  . Alcohol Use: No  . Drug Use: No  . Sexual Activity: Not Asked   Other Topics Concern  . None   Social History Narrative    Review of Systems: Pertinent ROS are listed in HPI  Physical Examination: {Exam; complete female:17872}  Diabetic Foot Exam - Simple   No data filed       Assessment/Plan: No problem-specific assessment & plan notes found for this encounter.

## 2015-10-17 NOTE — Progress Notes (Signed)
Pre visit review using our clinic review tool, if applicable. No additional management support is needed unless otherwise documented below in the visit note/SLS  

## 2015-10-18 DIAGNOSIS — M86672 Other chronic osteomyelitis, left ankle and foot: Secondary | ICD-10-CM | POA: Diagnosis not present

## 2015-10-18 DIAGNOSIS — E11621 Type 2 diabetes mellitus with foot ulcer: Secondary | ICD-10-CM | POA: Diagnosis not present

## 2015-10-18 DIAGNOSIS — L97513 Non-pressure chronic ulcer of other part of right foot with necrosis of muscle: Secondary | ICD-10-CM | POA: Diagnosis not present

## 2015-10-19 ENCOUNTER — Telehealth: Payer: Self-pay | Admitting: Physician Assistant

## 2015-10-19 DIAGNOSIS — L97412 Non-pressure chronic ulcer of right heel and midfoot with fat layer exposed: Secondary | ICD-10-CM | POA: Diagnosis not present

## 2015-10-19 DIAGNOSIS — L97422 Non-pressure chronic ulcer of left heel and midfoot with fat layer exposed: Secondary | ICD-10-CM | POA: Diagnosis not present

## 2015-10-19 DIAGNOSIS — E11621 Type 2 diabetes mellitus with foot ulcer: Secondary | ICD-10-CM | POA: Diagnosis not present

## 2015-10-19 DIAGNOSIS — M86672 Other chronic osteomyelitis, left ankle and foot: Secondary | ICD-10-CM | POA: Diagnosis not present

## 2015-10-19 DIAGNOSIS — L97523 Non-pressure chronic ulcer of other part of left foot with necrosis of muscle: Secondary | ICD-10-CM | POA: Diagnosis not present

## 2015-10-19 DIAGNOSIS — L97513 Non-pressure chronic ulcer of other part of right foot with necrosis of muscle: Secondary | ICD-10-CM | POA: Diagnosis not present

## 2015-10-19 DIAGNOSIS — S91329A Laceration with foreign body, unspecified foot, initial encounter: Secondary | ICD-10-CM | POA: Diagnosis not present

## 2015-10-19 NOTE — Telephone Encounter (Signed)
Spoke with pharmacy and they stated that she has TCAs & Ibuprofen in her Allergy Hx, they contacted patient and she "does not know what kind of reaction she had to these medications"; pharmacy will hold Rx until we can research further on Monday, 10/22/15 per provider/SLS 06/09

## 2015-10-19 NOTE — Telephone Encounter (Signed)
Relation to [id number] Call back [phone] Pharmacy: [facility] [place], [place] [phone] (Phone) [phone] (Fax)         Reason for call:  Pharmacy advised patient nortriptyline (PAMELOR) 10 MG capsule is in the family of what patient is allergic too. Please advise

## 2015-10-22 DIAGNOSIS — M86672 Other chronic osteomyelitis, left ankle and foot: Secondary | ICD-10-CM | POA: Diagnosis not present

## 2015-10-22 DIAGNOSIS — L97513 Non-pressure chronic ulcer of other part of right foot with necrosis of muscle: Secondary | ICD-10-CM | POA: Diagnosis not present

## 2015-10-22 DIAGNOSIS — E11621 Type 2 diabetes mellitus with foot ulcer: Secondary | ICD-10-CM | POA: Diagnosis not present

## 2015-10-22 NOTE — Telephone Encounter (Signed)
Patient thinks that possibly it was "itching with amitriptyline: but she is not sure, as it "has been such a long time ago"; explained that provider would be informed of this information and I will call her back with provider response, as this may warrant a change in the medication px, understood & agreed/SLS 06/12

## 2015-10-23 NOTE — Telephone Encounter (Signed)
Called and [facility] @ 3:12pm @ [phone]) asking the pt to RTC regarding note below.//AB/CMA

## 2015-10-23 NOTE — Telephone Encounter (Signed)
It itching was only side effect and she is not even sure of that I would discuss with her that we could try the Pamelor if she wants. If is causes itching we can stop. Unfortunately due to her list of allergies we are limited in options.

## 2015-10-24 DIAGNOSIS — L97513 Non-pressure chronic ulcer of other part of right foot with necrosis of muscle: Secondary | ICD-10-CM | POA: Diagnosis not present

## 2015-10-24 DIAGNOSIS — M86672 Other chronic osteomyelitis, left ankle and foot: Secondary | ICD-10-CM | POA: Diagnosis not present

## 2015-10-24 DIAGNOSIS — E11621 Type 2 diabetes mellitus with foot ulcer: Secondary | ICD-10-CM | POA: Diagnosis not present

## 2015-10-24 NOTE — Telephone Encounter (Signed)
Spoke with the pt and informed her of Cody's note below.  Pt verbalized understanding and agreed to try the Pamelor.  Informed the pt that a prescription has been sent to the pharmacy.   Asked the pt to please give [place] a call back if she starts to have any itching or any other issues.  Pt agreed.//AB/CMA

## 2015-10-25 DIAGNOSIS — E11621 Type 2 diabetes mellitus with foot ulcer: Secondary | ICD-10-CM | POA: Diagnosis not present

## 2015-10-25 DIAGNOSIS — M86672 Other chronic osteomyelitis, left ankle and foot: Secondary | ICD-10-CM | POA: Diagnosis not present

## 2015-10-25 DIAGNOSIS — L97513 Non-pressure chronic ulcer of other part of right foot with necrosis of muscle: Secondary | ICD-10-CM | POA: Diagnosis not present

## 2015-10-26 DIAGNOSIS — L97513 Non-pressure chronic ulcer of other part of right foot with necrosis of muscle: Secondary | ICD-10-CM | POA: Diagnosis not present

## 2015-10-26 DIAGNOSIS — E11621 Type 2 diabetes mellitus with foot ulcer: Secondary | ICD-10-CM | POA: Diagnosis not present

## 2015-10-26 DIAGNOSIS — M86672 Other chronic osteomyelitis, left ankle and foot: Secondary | ICD-10-CM | POA: Diagnosis not present

## 2015-10-29 DIAGNOSIS — E11621 Type 2 diabetes mellitus with foot ulcer: Secondary | ICD-10-CM | POA: Diagnosis not present

## 2015-10-29 DIAGNOSIS — M86672 Other chronic osteomyelitis, left ankle and foot: Secondary | ICD-10-CM | POA: Diagnosis not present

## 2015-10-29 DIAGNOSIS — L97513 Non-pressure chronic ulcer of other part of right foot with necrosis of muscle: Secondary | ICD-10-CM | POA: Diagnosis not present

## 2015-10-29 DIAGNOSIS — S91329A Laceration with foreign body, unspecified foot, initial encounter: Secondary | ICD-10-CM | POA: Diagnosis not present

## 2015-10-30 DIAGNOSIS — L97523 Non-pressure chronic ulcer of other part of left foot with necrosis of muscle: Secondary | ICD-10-CM | POA: Diagnosis not present

## 2015-10-30 DIAGNOSIS — L97423 Non-pressure chronic ulcer of left heel and midfoot with necrosis of muscle: Secondary | ICD-10-CM | POA: Diagnosis not present

## 2015-10-30 DIAGNOSIS — E11621 Type 2 diabetes mellitus with foot ulcer: Secondary | ICD-10-CM | POA: Diagnosis not present

## 2015-10-30 DIAGNOSIS — L97513 Non-pressure chronic ulcer of other part of right foot with necrosis of muscle: Secondary | ICD-10-CM | POA: Diagnosis not present

## 2015-10-30 DIAGNOSIS — L97412 Non-pressure chronic ulcer of right heel and midfoot with fat layer exposed: Secondary | ICD-10-CM | POA: Diagnosis not present

## 2015-10-30 DIAGNOSIS — M86672 Other chronic osteomyelitis, left ankle and foot: Secondary | ICD-10-CM | POA: Diagnosis not present

## 2015-10-30 DIAGNOSIS — M868X7 Other osteomyelitis, ankle and foot: Secondary | ICD-10-CM | POA: Diagnosis not present

## 2015-10-31 DIAGNOSIS — L97513 Non-pressure chronic ulcer of other part of right foot with necrosis of muscle: Secondary | ICD-10-CM | POA: Diagnosis not present

## 2015-10-31 DIAGNOSIS — S91329A Laceration with foreign body, unspecified foot, initial encounter: Secondary | ICD-10-CM | POA: Diagnosis not present

## 2015-10-31 DIAGNOSIS — E11621 Type 2 diabetes mellitus with foot ulcer: Secondary | ICD-10-CM | POA: Diagnosis not present

## 2015-10-31 DIAGNOSIS — M86672 Other chronic osteomyelitis, left ankle and foot: Secondary | ICD-10-CM | POA: Diagnosis not present

## 2015-11-01 DIAGNOSIS — L97411 Non-pressure chronic ulcer of right heel and midfoot limited to breakdown of skin: Secondary | ICD-10-CM | POA: Diagnosis not present

## 2015-11-01 DIAGNOSIS — L97513 Non-pressure chronic ulcer of other part of right foot with necrosis of muscle: Secondary | ICD-10-CM | POA: Diagnosis not present

## 2015-11-01 DIAGNOSIS — L97519 Non-pressure chronic ulcer of other part of right foot with unspecified severity: Secondary | ICD-10-CM | POA: Diagnosis not present

## 2015-11-01 DIAGNOSIS — M86672 Other chronic osteomyelitis, left ankle and foot: Secondary | ICD-10-CM | POA: Diagnosis not present

## 2015-11-01 DIAGNOSIS — I779 Disorder of arteries and arterioles, unspecified: Secondary | ICD-10-CM | POA: Diagnosis not present

## 2015-11-01 DIAGNOSIS — E11621 Type 2 diabetes mellitus with foot ulcer: Secondary | ICD-10-CM | POA: Diagnosis not present

## 2015-11-01 DIAGNOSIS — L97421 Non-pressure chronic ulcer of left heel and midfoot limited to breakdown of skin: Secondary | ICD-10-CM | POA: Diagnosis not present

## 2015-11-01 DIAGNOSIS — L97529 Non-pressure chronic ulcer of other part of left foot with unspecified severity: Secondary | ICD-10-CM | POA: Diagnosis not present

## 2015-11-02 ENCOUNTER — Other Ambulatory Visit: Payer: Self-pay | Admitting: *Deleted

## 2015-11-02 MED ORDER — ATORVASTATIN CALCIUM 20 MG PO TABS: 20.0000 mg | ORAL_TABLET | Freq: Every day | ORAL | Status: DC

## 2015-11-07 ENCOUNTER — Telehealth: Payer: Self-pay | Admitting: Internal Medicine

## 2015-11-07 ENCOUNTER — Other Ambulatory Visit (HOSPITAL_BASED_OUTPATIENT_CLINIC_OR_DEPARTMENT_OTHER): Payer: PPO

## 2015-11-07 ENCOUNTER — Ambulatory Visit (HOSPITAL_BASED_OUTPATIENT_CLINIC_OR_DEPARTMENT_OTHER): Payer: PPO | Admitting: Family

## 2015-11-07 ENCOUNTER — Telehealth: Payer: Self-pay | Admitting: Physician Assistant

## 2015-11-07 ENCOUNTER — Encounter: Payer: Self-pay | Admitting: Family

## 2015-11-07 VITALS — BP 108/62 | HR 73 | Temp 98.0°F | Resp 16 | Ht 62.0 in | Wt 174.0 lb

## 2015-11-07 DIAGNOSIS — L97524 Non-pressure chronic ulcer of other part of left foot with necrosis of bone: Secondary | ICD-10-CM | POA: Diagnosis not present

## 2015-11-07 DIAGNOSIS — E11621 Type 2 diabetes mellitus with foot ulcer: Secondary | ICD-10-CM | POA: Diagnosis not present

## 2015-11-07 DIAGNOSIS — I700 Atherosclerosis of aorta: Secondary | ICD-10-CM | POA: Diagnosis not present

## 2015-11-07 DIAGNOSIS — D508 Other iron deficiency anemias: Secondary | ICD-10-CM

## 2015-11-07 DIAGNOSIS — M86672 Other chronic osteomyelitis, left ankle and foot: Secondary | ICD-10-CM | POA: Diagnosis not present

## 2015-11-07 DIAGNOSIS — E1139 Type 2 diabetes mellitus with other diabetic ophthalmic complication: Secondary | ICD-10-CM

## 2015-11-07 DIAGNOSIS — R93421 Abnormal radiologic findings on diagnostic imaging of right kidney: Secondary | ICD-10-CM

## 2015-11-07 DIAGNOSIS — E1165 Type 2 diabetes mellitus with hyperglycemia: Secondary | ICD-10-CM

## 2015-11-07 DIAGNOSIS — K909 Intestinal malabsorption, unspecified: Secondary | ICD-10-CM

## 2015-11-07 DIAGNOSIS — L97513 Non-pressure chronic ulcer of other part of right foot with necrosis of muscle: Secondary | ICD-10-CM | POA: Diagnosis not present

## 2015-11-07 DIAGNOSIS — IMO0002 Reserved for concepts with insufficient information to code with codable children: Secondary | ICD-10-CM

## 2015-11-07 DIAGNOSIS — L97523 Non-pressure chronic ulcer of other part of left foot with necrosis of muscle: Secondary | ICD-10-CM | POA: Diagnosis not present

## 2015-11-07 DIAGNOSIS — D638 Anemia in other chronic diseases classified elsewhere: Secondary | ICD-10-CM

## 2015-11-07 DIAGNOSIS — L97519 Non-pressure chronic ulcer of other part of right foot with unspecified severity: Secondary | ICD-10-CM | POA: Diagnosis not present

## 2015-11-07 DIAGNOSIS — E1169 Type 2 diabetes mellitus with other specified complication: Secondary | ICD-10-CM

## 2015-11-07 DIAGNOSIS — D509 Iron deficiency anemia, unspecified: Secondary | ICD-10-CM | POA: Diagnosis not present

## 2015-11-07 DIAGNOSIS — Z794 Long term (current) use of insulin: Secondary | ICD-10-CM

## 2015-11-07 DIAGNOSIS — M86272 Subacute osteomyelitis, left ankle and foot: Secondary | ICD-10-CM | POA: Diagnosis not present

## 2015-11-07 DIAGNOSIS — L97529 Non-pressure chronic ulcer of other part of left foot with unspecified severity: Secondary | ICD-10-CM | POA: Diagnosis not present

## 2015-11-07 DIAGNOSIS — K8020 Calculus of gallbladder without cholecystitis without obstruction: Secondary | ICD-10-CM | POA: Diagnosis not present

## 2015-11-07 DIAGNOSIS — D631 Anemia in chronic kidney disease: Secondary | ICD-10-CM

## 2015-11-07 LAB — COMPREHENSIVE METABOLIC PANEL
ALT: 12 U/L (ref 0–55)
ANION GAP: 8 meq/L (ref 3–11)
AST: 13 U/L (ref 5–34)
Albumin: 3.2 g/dL — ABNORMAL LOW (ref 3.5–5.0)
Alkaline Phosphatase: 112 U/L (ref 40–150)
BUN: 26.4 mg/dL — ABNORMAL HIGH (ref 7.0–26.0)
CALCIUM: 9.3 mg/dL (ref 8.4–10.4)
CHLORIDE: 105 meq/L (ref 98–109)
CO2: 27 meq/L (ref 22–29)
CREATININE: 1 mg/dL (ref 0.6–1.1)
EGFR: 64 mL/min/{1.73_m2} — AB (ref >90)
Glucose: 131 mg/dl (ref 70–140)
Potassium: 4.9 mEq/L (ref 3.5–5.1)
Sodium: 139 mEq/L (ref 136–145)
TOTAL PROTEIN: 7.3 g/dL (ref 6.4–8.3)

## 2015-11-07 LAB — CBC WITH DIFFERENTIAL (CANCER CENTER ONLY)
BASO#: 0 10*3/uL (ref 0.0–0.2)
BASO%: 0.3 % (ref 0.0–2.0)
EOS%: 4.3 % (ref 0.0–7.0)
Eosinophils Absolute: 0.3 10*3/uL (ref 0.0–0.5)
HCT: 27.6 % — ABNORMAL LOW (ref 34.8–46.6)
HGB: 9 g/dL — ABNORMAL LOW (ref 11.6–15.9)
LYMPH#: 2.2 10*3/uL (ref 0.9–3.3)
LYMPH%: 32.2 % (ref 14.0–48.0)
MCH: 28 pg (ref 26.0–34.0)
MCHC: 32.6 g/dL (ref 32.0–36.0)
MCV: 86 fL (ref 81–101)
MONO#: 0.5 10*3/uL (ref 0.1–0.9)
MONO%: 7.2 % (ref 0.0–13.0)
NEUT%: 56 % (ref 39.6–80.0)
NEUTROS ABS: 3.7 10*3/uL (ref 1.5–6.5)
PLATELETS: 360 10*3/uL (ref 145–400)
RBC: 3.22 10*6/uL — ABNORMAL LOW (ref 3.70–5.32)
RDW: 17.3 % — ABNORMAL HIGH (ref 11.1–15.7)
WBC: 6.7 10*3/uL (ref 3.9–10.0)

## 2015-11-07 LAB — CHCC SATELLITE - SMEAR

## 2015-11-07 NOTE — Telephone Encounter (Signed)
Dana [name], CMA at 11/07/2015 5:44 PM     Status: Sign at exiting of workspace       Expand All Collapse All   LMOM with contact name and number [for return call, if needed] RE: results from Dana [name] per covering provider [Dana [name]] and need for pt to come in office to give urine sample; U/A and Urine culture orders have been placed/SLS 06/28        Dana Branch, MD at 11/07/2015 5:11 PM     Status: Signed       Expand All Collapse All   Received a copy of our ABI done 11/01/2015 which show possibly peripheral artery disease, follow-up by CT angiogram done 11/07/2015: No evidence of peripheral vascular disease Abnormal right kidney imaging, pyelonephritis? Other concerns are embolic infarct, tumor, lymphoma. Gallbladder stones Plan:  Call patient, needs a UA, urine culture DX UTI They also recommend to repeat renal MRI or CT in 3 months. Please made the PCP aware of these findings

## 2015-11-07 NOTE — Progress Notes (Signed)
Hematology and Oncology Follow Up Visit  Dana [name] [id number] 1955/07/31 60 y.o. 11/07/2015   Principle Diagnosis:  Iron deficiency anemia Poorly controlled diabetes type II  Current Therapy:   IV iron as indicated - last received in June 2017     Interim History:  Dana [name] is here today for a follow-up. She received two doses of Feraheme, one in May and one in June for an iron saturation of 8%. She can tell that her fatigue has improved.  Her Hgb is now 9.0 with an MCV of 86. BUN is 26.4 and Creatine is 1.0.  No fever, chills, n/v, cough, rash, dizziness, SOB, chest pain, palpitations, abdominal pain or changes in bowel or bladder habits.   She has uncontrolled diabetes type II (130-234). Her Hgb A1c was 7.5 in May.  She has ulcers on both feet and cleans and wraps them herself daily. She states that they are healing nicely. She has had multiple tow removed from both feet. Neuropathy in feet and hands is unchanged.  No episodes of bleeding or bruising. No lymphadenopathy found on exam.   Medications:    Medication List       This list is accurate as of: 11/07/15  2:08 PM.  Always use your most recent med list.               aspirin 81 MG tablet  Take 81 mg by mouth daily.     atorvastatin 20 MG tablet  Commonly known as:  LIPITOR  Take 1 tablet (20 mg total) by mouth at bedtime.     escitalopram 10 MG tablet  Commonly known as:  LEXAPRO  Take 1 tablet by mouth daily.     freestyle lancets  Use as instructed to check blood sugar 3 times per day dx code E11.39     FREESTYLE LITE Devi  Use to check blood sugar 3 times a day dx code E11.39     gabapentin 600 MG tablet  Commonly known as:  NEURONTIN  Take 2 tablets (1,200 mg total) by mouth 3 (three) times daily.     glucose blood test strip  Commonly known as:  FREESTYLE LITE  Use as instructed to check blood sugar 3 times a day dx code E11.39     insulin regular 100 units/mL injection  Commonly known as:   NOVOLIN R RELION  Take 6 units with breakfast, 8 units with lunch and 8 units with dinner     metFORMIN 500 MG tablet  Commonly known as:  GLUCOPHAGE  Take 1000 mg each morning and 500 mg each evening     OVER THE COUNTER MEDICATION  Centrum-Muli. Vitamin-Take 2 gummies every day.     REGRANEX 0.01 % gel  Generic drug:  becaplermin  AS DIRECTED     V-GO 30 Kit  Use one per day        Allergies:  Allergies  Allergen Reactions  . Ativan [Lorazepam] Other (See Comments)    Abnormal behavior  . Darvocet [Propoxyphene N-Acetaminophen] Itching  . Demerol [Meperidine] Other (See Comments)    Abnormal behavior  . Latex Itching  . Lithium Nausea And Vomiting    Can not keep this medication down. It makes her terribly ill.  . Oxycodone Other (See Comments)    Abnormal behavior    Past Medical History, Surgical history, Social history, and Family History were reviewed and updated.  Review of Systems: All other 10 point review of systems is negative.  Physical Exam:  height is '5\' 2"'  (1.575 m) and weight is 174 lb (78.926 kg). Her oral temperature is 98 F (36.7 C). Her blood pressure is 108/62 and her pulse is 73. Her respiration is 16.   Wt Readings from Last 3 Encounters:  11/07/15 174 lb (78.926 kg)  10/17/15 171 lb 2 oz (77.622 kg)  09/20/15 166 lb 6.4 oz (75.479 kg)    Ocular: Sclerae unicteric, pupils equal, round and reactive to light Ear-nose-throat: Oropharynx clear, dentition fair Lymphatic: No cervical supraclavicular or axillary adenopathy Lungs no rales or rhonchi, good excursion bilaterally Heart regular rate and rhythm, no murmur appreciated Abd soft, nontender, positive bowel sounds, no liver or spleen tip palpated on exam, no fluid wave  MSK no focal spinal tenderness, no joint edema Neuro: non-focal, well-oriented, appropriate affect Breasts: Deferred  Lab Results  Component Value Date   WBC 6.7 11/07/2015   HGB 9.0* 11/07/2015   HCT 27.6*  11/07/2015   MCV 86 11/07/2015   PLT 360 11/07/2015   Lab Results  Component Value Date   FERRITIN 69 09/20/2015   IRON 22* 09/20/2015   TIBC 286 09/20/2015   UIBC 264 09/20/2015   IRONPCTSAT 8* 09/20/2015   Lab Results  Component Value Date   RETICCTPCT 1.8 10/24/2011   RBC 3.22* 11/07/2015   No results found for: KPAFRELGTCHN, LAMBDASER, KAPLAMBRATIO No results found for: IGGSERUM, IGA, IGMSERUM No results found for: Odetta Pink, SPEI   Chemistry      Component Value Date/Time   NA 138 09/20/2015 1344   NA 137 08/31/2015 1349   K 4.7 09/20/2015 1344   K 5.0 08/31/2015 1349   CL 99 09/20/2015 1344   CL 101 08/31/2015 1349   CO2 30 09/20/2015 1344   CO2 29 08/31/2015 1349   BUN 21 09/20/2015 1344   BUN 22 08/31/2015 1349   CREATININE 1.0 09/20/2015 1344   CREATININE 0.76 08/31/2015 1349      Component Value Date/Time   CALCIUM 9.4 09/20/2015 1344   CALCIUM 9.3 08/31/2015 1349   ALKPHOS 97* 09/20/2015 1344   ALKPHOS 138* 08/31/2015 1349   AST 15 09/20/2015 1344   AST 11 08/31/2015 1349   ALT 23 09/20/2015 1344   ALT 15 08/31/2015 1349   BILITOT 0.50 09/20/2015 1344   BILITOT 0.3 08/31/2015 1349     Impression and Plan: Dana [name] is a very pleasant 60 yo white female with history of iron deficiency anemia. She responded nicely to the 2 doses of iron she received earlier this month. She is doing well and fatigue is much improved.  We will work on her PA for Aranesp and plan to give her her fist dose next week per Dr. Marin Olp.  We will see what her iron studies show and bring her back in later this week for an infusion if needed.  We will plan to see her back in 2 months for repeat lab work and follow-up.  She will contact us with any questions or concerns. We can certainly see her sooner if need be.   Dana Bottom, NP 6/28/20172:08 PM

## 2015-11-07 NOTE — Telephone Encounter (Signed)
Spoke with nurse at [facility] and informed her that pt's PCP is out of the office until Wed, 11/14/15, and if there is something that we need to do on our part, if she could fax the information over so that I can forward it to the covering provider. She is going to fax over the report and if there is anything that we need to respond to, she is going to place a note in the fax; she also stated that if we have any questions to call back and speak with Bobbie/SLS 06/28

## 2015-11-07 NOTE — Telephone Encounter (Signed)
Received a copy of our ABI done 11/01/2015 which show possibly peripheral artery disease, follow-up by CT angiogram done 11/07/2015: No evidence of peripheral vascular disease Abnormal right kidney imaging, pyelonephritis? Other concerns are embolic infarct, tumor, lymphoma. Gallbladder stones Plan:  Call patient, needs a UA, urine culture DX UTI They also recommend to repeat renal MRI or CT in 3 months. Please  made the PCP aware of these findings

## 2015-11-07 NOTE — Telephone Encounter (Signed)
Caller name: Relationship to patient: Bobbi Can be reached: [phone]  Reason for call: [facility] request call back on patient's ABI test that is elevated.

## 2015-11-07 NOTE — Telephone Encounter (Signed)
LMOM with contact name and number [for return call, if needed] RE: results from [facility] per covering provider [Paz] and need for pt to come in office to give urine sample; U/A and Urine culture orders have been placed/SLS 06/28

## 2015-11-07 NOTE — Telephone Encounter (Signed)
Fax received, numbered and forwarded to covering provider [Paz]/SLS 06/28

## 2015-11-08 LAB — RETICULOCYTES: RETICULOCYTE COUNT: 1.2 % (ref 0.6–2.6)

## 2015-11-08 LAB — IRON AND TIBC
%SAT: 14 % — ABNORMAL LOW (ref 21–57)
IRON: 34 ug/dL — AB (ref 41–142)
TIBC: 236 ug/dL (ref 236–444)
UIBC: 203 ug/dL (ref 120–384)

## 2015-11-08 LAB — FERRITIN: Ferritin: 272 ng/ml — ABNORMAL HIGH (ref 9–269)

## 2015-11-12 ENCOUNTER — Ambulatory Visit (HOSPITAL_BASED_OUTPATIENT_CLINIC_OR_DEPARTMENT_OTHER): Payer: PPO

## 2015-11-12 ENCOUNTER — Other Ambulatory Visit: Payer: Self-pay | Admitting: Family

## 2015-11-12 VITALS — BP 112/67 | HR 83 | Temp 97.9°F | Resp 16

## 2015-11-12 DIAGNOSIS — D649 Anemia, unspecified: Secondary | ICD-10-CM

## 2015-11-12 DIAGNOSIS — K909 Intestinal malabsorption, unspecified: Secondary | ICD-10-CM

## 2015-11-12 DIAGNOSIS — D631 Anemia in chronic kidney disease: Secondary | ICD-10-CM

## 2015-11-12 DIAGNOSIS — D508 Other iron deficiency anemias: Secondary | ICD-10-CM

## 2015-11-12 MED ORDER — DARBEPOETIN ALFA 300 MCG/0.6ML IJ SOSY
PREFILLED_SYRINGE | INTRAMUSCULAR | Status: AC
  Filled 2015-11-12: qty 0.6

## 2015-11-12 MED ORDER — DARBEPOETIN ALFA 300 MCG/0.6ML IJ SOSY
300.0000 ug | PREFILLED_SYRINGE | Freq: Once | INTRAMUSCULAR | Status: AC
  Administered 2015-11-12: 300 ug via SUBCUTANEOUS

## 2015-11-12 NOTE — Patient Instructions (Signed)
Darbepoetin Alfa injection What is this medicine? DARBEPOETIN ALFA (dar be POE e tin AL fa) helps your body make more red blood cells. It is used to treat anemia caused by chronic kidney failure and chemotherapy. This medicine may be used for other purposes; ask your health care provider or pharmacist if you have questions. What should I tell my health care provider before I take this medicine? They need to know if you have any of these conditions: -blood clotting disorders or history of blood clots -cancer patient not on chemotherapy -cystic fibrosis -heart disease, such as angina, heart failure, or a history of a heart attack -hemoglobin level of 12 g/dL or greater -high blood pressure -low levels of folate, iron, or vitamin B12 -seizures -an unusual or allergic reaction to darbepoetin, erythropoietin, albumin, hamster proteins, latex, other medicines, foods, dyes, or preservatives -pregnant or trying to get pregnant -breast-feeding How should I use this medicine? This medicine is for injection into a vein or under the skin. It is usually given by a health care professional in a hospital or clinic setting. If you get this medicine at home, you will be taught how to prepare and give this medicine. Do not shake the solution before you withdraw a dose. Use exactly as directed. Take your medicine at regular intervals. Do not take your medicine more often than directed. It is important that you put your used needles and syringes in a special sharps container. Do not put them in a trash can. If you do not have a sharps container, call your pharmacist or healthcare provider to get one. Talk to your pediatrician regarding the use of this medicine in children. While this medicine may be used in children as young as 1 year for selected conditions, precautions do apply. Overdosage: If you think you have taken too much of this medicine contact a poison control center or emergency room at once. NOTE:  This medicine is only for you. Do not share this medicine with others. What if I miss a dose? If you miss a dose, take it as soon as you can. If it is almost time for your next dose, take only that dose. Do not take double or extra doses. What may interact with this medicine? Do not take this medicine with any of the following medications: -epoetin alfa This list may not describe all possible interactions. Give your health care provider a list of all the medicines, herbs, non-prescription drugs, or dietary supplements you use. Also tell them if you smoke, drink alcohol, or use illegal drugs. Some items may interact with your medicine. What should I watch for while using this medicine? Visit your prescriber or health care professional for regular checks on your progress and for the needed blood tests and blood pressure measurements. It is especially important for the doctor to make sure your hemoglobin level is in the desired range, to limit the risk of potential side effects and to give you the best benefit. Keep all appointments for any recommended tests. Check your blood pressure as directed. Ask your doctor what your blood pressure should be and when you should contact him or her. As your body makes more red blood cells, you may need to take iron, folic acid, or vitamin B supplements. Ask your doctor or health care provider which products are right for you. If you have kidney disease continue dietary restrictions, even though this medication can make you feel better. Talk with your doctor or health care professional about the   foods you eat and the vitamins that you take. What side effects may I notice from receiving this medicine? Side effects that you should report to your doctor or health care professional as soon as possible: -allergic reactions like skin rash, itching or hives, swelling of the face, lips, or tongue -breathing problems -changes in vision -chest pain -confusion, trouble speaking  or understanding -feeling faint or lightheaded, falls -high blood pressure -muscle aches or pains -pain, swelling, warmth in the leg -rapid weight gain -severe headaches -sudden numbness or weakness of the face, arm or leg -trouble walking, dizziness, loss of balance or coordination -seizures (convulsions) -swelling of the ankles, feet, hands -unusually weak or tired Side effects that usually do not require medical attention (report to your doctor or health care professional if they continue or are bothersome): -diarrhea -fever, chills (flu-like symptoms) -headaches -nausea, vomiting -redness, stinging, or swelling at site where injected This list may not describe all possible side effects. Call your doctor for medical advice about side effects. You may report side effects to FDA at 1-800-FDA-1088. Where should I keep my medicine? Keep out of the reach of children. Store in a refrigerator between 2 and 8 degrees C (36 and 46 degrees F). Do not freeze. Do not shake. Throw away any unused portion if using a single-dose vial. Throw away any unused medicine after the expiration date. NOTE: This sheet is a summary. It may not cover all possible information. If you have questions about this medicine, talk to your doctor, pharmacist, or health care provider.    2016, Elsevier/Gold Standard. (2008-04-11 10:23:57)  

## 2015-11-14 ENCOUNTER — Encounter: Payer: Self-pay | Admitting: Hematology & Oncology

## 2015-11-14 DIAGNOSIS — S91329A Laceration with foreign body, unspecified foot, initial encounter: Secondary | ICD-10-CM | POA: Diagnosis not present

## 2015-11-19 DIAGNOSIS — E11621 Type 2 diabetes mellitus with foot ulcer: Secondary | ICD-10-CM | POA: Diagnosis not present

## 2015-11-19 DIAGNOSIS — L97523 Non-pressure chronic ulcer of other part of left foot with necrosis of muscle: Secondary | ICD-10-CM | POA: Diagnosis not present

## 2015-11-19 DIAGNOSIS — L97513 Non-pressure chronic ulcer of other part of right foot with necrosis of muscle: Secondary | ICD-10-CM | POA: Diagnosis not present

## 2015-11-20 NOTE — Telephone Encounter (Signed)
Pt called in to follow up on if PCP received results. ALSO, informed pt of the below. She says that she will be in as soon as she's able to to provide a urine culture. Pt says that [facility] did go over results with her.    No call back needed.

## 2015-11-22 ENCOUNTER — Other Ambulatory Visit: Payer: Self-pay

## 2015-11-22 DIAGNOSIS — K909 Intestinal malabsorption, unspecified: Secondary | ICD-10-CM

## 2015-11-23 NOTE — Telephone Encounter (Signed)
Dana [name] at 11/20/2015 1:54 PM     Status: Signed       Expand All Collapse All   Pt called in to follow up on if PCP received results. ALSO, informed pt of the below. She says that she will be in as soon as she's able to to provide a urine culture. Pt says that [facility] did go over results with her.    No call back needed

## 2015-11-26 ENCOUNTER — Other Ambulatory Visit (INDEPENDENT_AMBULATORY_CARE_PROVIDER_SITE_OTHER): Payer: PPO

## 2015-11-26 ENCOUNTER — Ambulatory Visit (HOSPITAL_BASED_OUTPATIENT_CLINIC_OR_DEPARTMENT_OTHER): Payer: PPO

## 2015-11-26 VITALS — BP 111/61 | HR 62 | Temp 98.2°F | Resp 16

## 2015-11-26 DIAGNOSIS — K909 Intestinal malabsorption, unspecified: Secondary | ICD-10-CM | POA: Diagnosis not present

## 2015-11-26 DIAGNOSIS — R93421 Abnormal radiologic findings on diagnostic imaging of right kidney: Secondary | ICD-10-CM

## 2015-11-26 DIAGNOSIS — L97513 Non-pressure chronic ulcer of other part of right foot with necrosis of muscle: Secondary | ICD-10-CM | POA: Diagnosis not present

## 2015-11-26 DIAGNOSIS — L97523 Non-pressure chronic ulcer of other part of left foot with necrosis of muscle: Secondary | ICD-10-CM | POA: Diagnosis not present

## 2015-11-26 DIAGNOSIS — M86272 Subacute osteomyelitis, left ankle and foot: Secondary | ICD-10-CM | POA: Diagnosis not present

## 2015-11-26 DIAGNOSIS — D508 Other iron deficiency anemias: Secondary | ICD-10-CM | POA: Diagnosis not present

## 2015-11-26 DIAGNOSIS — S91329A Laceration with foreign body, unspecified foot, initial encounter: Secondary | ICD-10-CM | POA: Diagnosis not present

## 2015-11-26 DIAGNOSIS — E11621 Type 2 diabetes mellitus with foot ulcer: Secondary | ICD-10-CM | POA: Diagnosis not present

## 2015-11-26 LAB — URINALYSIS, ROUTINE W REFLEX MICROSCOPIC
BILIRUBIN URINE: NEGATIVE
Ketones, ur: NEGATIVE
LEUKOCYTES UA: NEGATIVE
NITRITE: NEGATIVE
Specific Gravity, Urine: 1.025 (ref 1.000–1.030)
Urine Glucose: NEGATIVE
Urobilinogen, UA: 0.2 (ref 0.0–1.0)
pH: 5.5 (ref 5.0–8.0)

## 2015-11-26 MED ORDER — SODIUM CHLORIDE 0.9 % IV SOLN
INTRAVENOUS | Status: DC
  Administered 2015-11-26: 15:00:00 via INTRAVENOUS

## 2015-11-26 MED ORDER — SODIUM CHLORIDE 0.9 % IV SOLN
510.0000 mg | Freq: Once | INTRAVENOUS | Status: AC
  Administered 2015-11-26: 510 mg via INTRAVENOUS
  Filled 2015-11-26: qty 17

## 2015-11-26 NOTE — Patient Instructions (Signed)

## 2015-11-28 ENCOUNTER — Encounter: Payer: Self-pay | Admitting: Physician Assistant

## 2015-11-28 DIAGNOSIS — S91329A Laceration with foreign body, unspecified foot, initial encounter: Secondary | ICD-10-CM | POA: Diagnosis not present

## 2015-12-10 ENCOUNTER — Encounter: Payer: Self-pay | Admitting: Physician Assistant

## 2015-12-10 ENCOUNTER — Encounter: Payer: Self-pay | Admitting: Hematology & Oncology

## 2015-12-10 ENCOUNTER — Telehealth: Payer: Self-pay | Admitting: Endocrinology

## 2015-12-10 DIAGNOSIS — E11621 Type 2 diabetes mellitus with foot ulcer: Secondary | ICD-10-CM | POA: Diagnosis not present

## 2015-12-10 DIAGNOSIS — S91329A Laceration with foreign body, unspecified foot, initial encounter: Secondary | ICD-10-CM | POA: Diagnosis not present

## 2015-12-10 DIAGNOSIS — L97513 Non-pressure chronic ulcer of other part of right foot with necrosis of muscle: Secondary | ICD-10-CM | POA: Diagnosis not present

## 2015-12-10 DIAGNOSIS — G629 Polyneuropathy, unspecified: Secondary | ICD-10-CM

## 2015-12-10 DIAGNOSIS — L97421 Non-pressure chronic ulcer of left heel and midfoot limited to breakdown of skin: Secondary | ICD-10-CM | POA: Diagnosis not present

## 2015-12-10 DIAGNOSIS — L97523 Non-pressure chronic ulcer of other part of left foot with necrosis of muscle: Secondary | ICD-10-CM | POA: Diagnosis not present

## 2015-12-10 MED ORDER — GLUCOSE BLOOD VI STRP: ORAL_STRIP | 3 refills | Status: DC

## 2015-12-10 NOTE — Telephone Encounter (Signed)
PT needs test strips sent to [facility]

## 2015-12-10 NOTE — Telephone Encounter (Signed)
Rx submitted per pt's request.  

## 2015-12-20 DIAGNOSIS — L97523 Non-pressure chronic ulcer of other part of left foot with necrosis of muscle: Secondary | ICD-10-CM | POA: Diagnosis not present

## 2015-12-20 DIAGNOSIS — L97423 Non-pressure chronic ulcer of left heel and midfoot with necrosis of muscle: Secondary | ICD-10-CM | POA: Diagnosis not present

## 2015-12-20 DIAGNOSIS — M868X7 Other osteomyelitis, ankle and foot: Secondary | ICD-10-CM | POA: Diagnosis not present

## 2015-12-20 DIAGNOSIS — E11621 Type 2 diabetes mellitus with foot ulcer: Secondary | ICD-10-CM | POA: Diagnosis not present

## 2015-12-20 DIAGNOSIS — S91329A Laceration with foreign body, unspecified foot, initial encounter: Secondary | ICD-10-CM | POA: Diagnosis not present

## 2015-12-20 DIAGNOSIS — L97413 Non-pressure chronic ulcer of right heel and midfoot with necrosis of muscle: Secondary | ICD-10-CM | POA: Diagnosis not present

## 2015-12-20 DIAGNOSIS — L97513 Non-pressure chronic ulcer of other part of right foot with necrosis of muscle: Secondary | ICD-10-CM | POA: Diagnosis not present

## 2015-12-21 ENCOUNTER — Ambulatory Visit: Payer: PPO | Admitting: Endocrinology

## 2015-12-26 ENCOUNTER — Telehealth: Payer: Self-pay | Admitting: Endocrinology

## 2015-12-26 MED ORDER — "INSULIN SYRINGE 29G X 1/2"" 1 ML MISC": 2 refills | Status: DC

## 2015-12-26 NOTE — Telephone Encounter (Signed)
Rx submitted per pt's request.  

## 2015-12-26 NOTE — Telephone Encounter (Signed)
Patient need refill of her syringes send to  [facility], [place] [phone] (Phone) [phone] (Fax)

## 2015-12-27 DIAGNOSIS — L97513 Non-pressure chronic ulcer of other part of right foot with necrosis of muscle: Secondary | ICD-10-CM | POA: Diagnosis not present

## 2015-12-27 DIAGNOSIS — M868X7 Other osteomyelitis, ankle and foot: Secondary | ICD-10-CM | POA: Diagnosis not present

## 2015-12-27 DIAGNOSIS — E11621 Type 2 diabetes mellitus with foot ulcer: Secondary | ICD-10-CM | POA: Diagnosis not present

## 2015-12-27 DIAGNOSIS — M86272 Subacute osteomyelitis, left ankle and foot: Secondary | ICD-10-CM | POA: Diagnosis not present

## 2015-12-27 DIAGNOSIS — L97412 Non-pressure chronic ulcer of right heel and midfoot with fat layer exposed: Secondary | ICD-10-CM | POA: Diagnosis not present

## 2015-12-27 DIAGNOSIS — S91329A Laceration with foreign body, unspecified foot, initial encounter: Secondary | ICD-10-CM | POA: Diagnosis not present

## 2015-12-27 DIAGNOSIS — L97422 Non-pressure chronic ulcer of left heel and midfoot with fat layer exposed: Secondary | ICD-10-CM | POA: Diagnosis not present

## 2015-12-27 DIAGNOSIS — L97511 Non-pressure chronic ulcer of other part of right foot limited to breakdown of skin: Secondary | ICD-10-CM | POA: Diagnosis not present

## 2015-12-28 ENCOUNTER — Encounter: Payer: Self-pay | Admitting: Physical Medicine & Rehabilitation

## 2015-12-28 ENCOUNTER — Encounter: Payer: PPO | Attending: Physical Medicine & Rehabilitation | Admitting: Physical Medicine & Rehabilitation

## 2015-12-28 VITALS — BP 113/75 | HR 75

## 2015-12-28 DIAGNOSIS — Z794 Long term (current) use of insulin: Secondary | ICD-10-CM

## 2015-12-28 DIAGNOSIS — F319 Bipolar disorder, unspecified: Secondary | ICD-10-CM | POA: Diagnosis not present

## 2015-12-28 DIAGNOSIS — Z79899 Other long term (current) drug therapy: Secondary | ICD-10-CM | POA: Diagnosis not present

## 2015-12-28 DIAGNOSIS — E1139 Type 2 diabetes mellitus with other diabetic ophthalmic complication: Secondary | ICD-10-CM

## 2015-12-28 DIAGNOSIS — Z89422 Acquired absence of other left toe(s): Secondary | ICD-10-CM | POA: Diagnosis not present

## 2015-12-28 DIAGNOSIS — E1165 Type 2 diabetes mellitus with hyperglycemia: Secondary | ICD-10-CM

## 2015-12-28 DIAGNOSIS — IMO0002 Reserved for concepts with insufficient information to code with codable children: Secondary | ICD-10-CM

## 2015-12-28 DIAGNOSIS — Z89432 Acquired absence of left foot: Secondary | ICD-10-CM

## 2015-12-28 DIAGNOSIS — L97519 Non-pressure chronic ulcer of other part of right foot with unspecified severity: Secondary | ICD-10-CM

## 2015-12-28 DIAGNOSIS — G546 Phantom limb syndrome with pain: Secondary | ICD-10-CM | POA: Diagnosis not present

## 2015-12-28 DIAGNOSIS — Z89431 Acquired absence of right foot: Secondary | ICD-10-CM

## 2015-12-28 DIAGNOSIS — Z89421 Acquired absence of other right toe(s): Secondary | ICD-10-CM | POA: Insufficient documentation

## 2015-12-28 DIAGNOSIS — E119 Type 2 diabetes mellitus without complications: Secondary | ICD-10-CM | POA: Diagnosis not present

## 2015-12-28 DIAGNOSIS — L97529 Non-pressure chronic ulcer of other part of left foot with unspecified severity: Secondary | ICD-10-CM

## 2015-12-28 DIAGNOSIS — E11621 Type 2 diabetes mellitus with foot ulcer: Secondary | ICD-10-CM

## 2015-12-28 MED ORDER — PREGABALIN 100 MG PO CAPS: 100.0000 mg | ORAL_CAPSULE | Freq: Three times a day (TID) | ORAL | 1 refills | Status: DC

## 2015-12-28 NOTE — Progress Notes (Addendum)
Subjective:    Patient ID: Dana [name], female    DOB: 1956/01/04, 61 y.o.   MRN: [id number]  HPI  60 y/o female with pmh of DM type 2 and Bipolar disorder and psh of right TMA and LLE 1st digit amputation presents with phantom limb pain.  It is located in b/l LE.  It started ~2014 after surgery.  IBU sometimes helps. She cannot identify exacerbating factors.  Intermittent.  Sharp and pins.  She has tried tramadol without relief. She has occasional falls.  Pain limits her from doing enjoyable activities. She also has wounds at the bottom of her feet and follows with wound clinic.   Pain Inventory Average Pain 8 Pain Right Now 8 My pain is sharp, burning, stabbing and tingling  In the last 24 hours, has pain interfered with the following? General activity 8 Relation with others 8 Enjoyment of life 9 What TIME of day is your pain at its worst? morning and evening Sleep (in general) Fair  Pain is worse with: walking and standing Pain improves with: rest Relief from Meds: 5  Mobility walk without assistance ability to climb steps?  yes do you drive?  yes transfers alone Do you have any goals in this area?  yes  Function disabled: date disabled 2014 Do you have any goals in this area?  no  Neuro/Psych bladder control problems weakness numbness tingling trouble walking  Prior Studies Any changes since last visit?  yes  Physicians involved in your care na   Family History  Problem Relation Age of Onset  . Diabetes Mother   . Mental illness Mother   . Hypertension Father   . Diabetes Maternal Grandmother   . Heart disease Neg Hx    Social History   Social History  . Marital status: Married    Spouse name: N/A  . Number of children: N/A  . Years of education: N/A   Social History Main Topics  . Smoking status: Never Smoker  . Smokeless tobacco: Never Used  . Alcohol use No  . Drug use: No  . Sexual activity: Not Asked   Other Topics Concern  . None     Social History Narrative  . None   Past Surgical History:  Procedure Laterality Date  . ABDOMINAL HYSTERECTOMY    . ADENOIDECTOMY     Hx: of  . AMPUTATION  10/24/2011   Procedure: AMPUTATION RAY;  Surgeon: Newt Minion, MD;  Location: [place];  Service: Orthopedics;  Laterality: Right;  Right Foot 3rd Ray Amputation  . AMPUTATION Right 12/16/2012   Procedure: Right Foot Transmetatarsal Amputation;  Surgeon: Newt Minion, MD;  Location: [place];  Service: Orthopedics;  Laterality: Right;  . AMPUTATION Left 03/09/2015   Procedure: LEFT FOOT 1ST RAY AMPUTATION;  Surgeon: Newt Minion, MD;  Location: [place];  Service: Orthopedics;  Laterality: Left;  . Bladder Tact   2002  . BREAST SURGERY    . COLON SURGERY    . NASAL SEPTUM SURGERY    . TONSILLECTOMY     Past Medical History:  Diagnosis Date  . Bipolar 1 disorder ([place])   . Cellulitis and abscess of foot 03/08/2015  . Diabetes mellitus    INSULIN DEPENDENT  . Diabetic neuropathy ([place])   . Diabetic neuropathy ([place])   . 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 H/O hiatal hernia   . Hyperlipidemia   . Hypertension    past hx of  . Iron malabsorption 09/26/2015  .  Numbness and tingling    Hx; of in B/LLE and B/LUE  . Other iron deficiency anemias 09/20/2015   BP 113/75   Pulse 75   SpO2 95%   Opioid Risk Score:   Fall Risk Score:  `1  Depression screen PHQ 2/9  Depression screen [facility] 2/9 12/28/2015 11/08/2014  Decreased Interest 0 0  Down, Depressed, Hopeless 0 0  PHQ - 2 Score 0 0  Altered sleeping 0 -  Tired, decreased energy 1 -  Change in appetite 1 -  Feeling bad or failure about yourself  0 -  Trouble concentrating 1 -  Moving slowly or fidgety/restless 0 -  Suicidal thoughts 0 -  PHQ-9 Score 3 -   Review of Systems  HENT: Negative.   Eyes: Negative.   Respiratory: Negative.   Cardiovascular: Negative.   Gastrointestinal: Negative.   Endocrine: Negative.   Genitourinary: Positive for dysuria and  frequency.  Musculoskeletal: Positive for gait problem.  Skin: Positive for wound.  Allergic/Immunologic: Negative.   Neurological: Positive for weakness and numbness.  Psychiatric/Behavioral: Negative.   All other systems reviewed and are negative.      Objective:   Physical Exam Gen: NAD. Vital signs reviewed HENT: Normocephalic, Atraumatic Eyes: EOMI, Conj WNL Cardio: S1, S2 normal, RRR Pulm: B/l clear to auscultation.  Effort normal Abd: Soft, non-distended, non-tender, BS+ MSK:  Gait altered due to amputations.   TTP plantar foot    No edema.   TMA RLE  LLE 1st digit LLE amputation Neuro: CN II-XII grossly intact.    Sensation diminished to light touch in b/l feet  Strength  5/5 in all LE myotomes Skin: Warm and Dry    Assessment & Plan:  60 y/o female with pmh of DM type 2 and Bipolar disorder and psh of right TMA and LLE 1st digit amputation presents with phantom limb pain.  1. TMA RLE with phantom limb pain  Tried tramadol, IBU without benefit  Cont to follow with wound clinic for wounds  Will refer to PT for mirror therapy, TENS, and desensitization techniques eval  Will d/c Gabapentin 1200 TID and start pt on Lyrica 100 TID, will consider increased to 150 TID  Will consider Mobic 15mg  daily  Will consider TCAs    2. LLE 1st digit amputation with phantom limb pain  See #1  3. Bipolar disorder 1  Will attempt to limit use of Cymbalta  4. DM   Monitor CBGs, running 100-200  Follow up with PCP for med adjustments

## 2016-01-01 ENCOUNTER — Telehealth: Payer: Self-pay | Admitting: Physical Medicine & Rehabilitation

## 2016-01-01 NOTE — Telephone Encounter (Signed)
She can resume her Gabapentin 1200 TID.  Thanks

## 2016-01-01 NOTE — Telephone Encounter (Signed)
Please advise 

## 2016-01-01 NOTE — Telephone Encounter (Signed)
Patient was given a prescription for Lyrica and it is too expensive for her, it is around $200.  She would like to know what else Dr. Posey Pronto may be able to prescribe her that isn't so expensive.

## 2016-01-02 ENCOUNTER — Telehealth: Payer: Self-pay | Admitting: Physical Medicine & Rehabilitation

## 2016-01-02 NOTE — Telephone Encounter (Signed)
Left message to return call 

## 2016-01-02 NOTE — Telephone Encounter (Signed)
Patient returned Amber's call

## 2016-01-03 ENCOUNTER — Ambulatory Visit (INDEPENDENT_AMBULATORY_CARE_PROVIDER_SITE_OTHER): Payer: PPO | Admitting: Neurology

## 2016-01-03 ENCOUNTER — Encounter: Payer: Self-pay | Admitting: Neurology

## 2016-01-03 VITALS — BP 132/71 | HR 64 | Ht 61.0 in | Wt 174.2 lb

## 2016-01-03 DIAGNOSIS — G629 Polyneuropathy, unspecified: Secondary | ICD-10-CM | POA: Diagnosis not present

## 2016-01-03 DIAGNOSIS — E538 Deficiency of other specified B group vitamins: Secondary | ICD-10-CM

## 2016-01-03 DIAGNOSIS — R5383 Other fatigue: Secondary | ICD-10-CM

## 2016-01-03 DIAGNOSIS — E1142 Type 2 diabetes mellitus with diabetic polyneuropathy: Secondary | ICD-10-CM

## 2016-01-03 NOTE — Progress Notes (Signed)
[facility]    Provider:  Dr Jaynee Eagles Referring Provider: Brunetta Jeans, PA-C Primary Care Physician:  [name], PA-C  CC:  she can't walk during the day because of the wounds on the bottom of her feet, pins and needles  HPI:  Dana [name] is a 60 y.o. female here as a referral from Dr. Hassell Done for pain and neuropathy. She is on gabapentin 600 mg 3. She has a past medical history of poorly controlled diabetes, iron deficiency anemia, hypertension, hyperlipidemia, diabetic neuropathy, bipolar 1 disorder, cellulitis and abscess of the feet, left lower extremity first digit amputation and phantom limb pain, right TMA. Last hemoglobin A1c was 7.5 however hgba1c was as high as 13 less than a year ago.She describes neuropathy severe in the last 9 months, but neuropathy symptoms started 8 years ago with numbness, tingling and burning and progressively worsened int he setting of uncontrolled diabetes also in the hands. She had CTS surgery 8-10 years. She had amputation of the left toe and half the foot on the right. She has wounds on the bottom of the feet bilaterally and follows with wound center. She reports uncontrolled diabetes for years. She also reports a spider bite on the foot causing the amputation on the right and a wound from a pedicure on the left which initiated the amputation. She has had osteomyelitis. She has difficulty walking, she has continuous pain in the feet with stabbing pain, burning, stinging and a lot of phantom pain. She is on maximun dose of neurontin. She has been to pain management and they are trying to manage her pain medicine. She also has progressive upper extremity bilateral hand pain. The left hand is getting worse the digits 4-5, the right hand feels very tight, she reports dropping objects from the right hand and a lot of numbness in all the fingers. She has poor balance. She has occasional falls due to her imbalance and pain in the feet.  Pain is worse at night. Medications help minimally.  Reviewed notes, labs and imaging from outside physicians, which showed:   Her primary care notes: She is on gabapentin and she has tried tramadol without relief. She has wounds on the bottom of her feet and follows with wound clinic.Lyrica was too expensive. She has not tried cymbalta. They tried Nortriptyline. Chronic pain secondary to diabetic neuropathy and phantom limb pain. They tried the max dose of gabapentin daily. She was still noting moderate pain worse at night. She cannot tolerate narcotic pain medication due to side effects.  Last hemoglobin A1c 7.5 in May. Hemoglobin A1c was 13 in October 2016, 10 year ago 11.84.  Patient saw Dr. Posey Pronto this month for pain management, reviewed notes: For pain in her feet. Intermittent, sharp and pins. She tried tramadol without relief. She has occasional falls. Disabled in 2014. Diagnosed with TMA right lower extremity with phantom limb pain and first digit amputation on the left, try tramadol ibuprofen without relief, continues to follow in wound clinic. Was referred to PT for mirror therapy, TENS and desensitization techniques, gabapentin was discontinued and Lyrica was started. Mobic was considered as well as tricyclic antidepressants. Due to her bipolar disorder who is recommended to limit use of Cymbalta. Highly recommended close management of diabetes.  Reviewed oncology notes, she has anemia associated with poorly controlled diabetes. She is on IV infusion therapy.  Review of Systems: Patient complains of symptoms per HPI as well as the following symptoms: Blurred vision, anemia, urination problems,  memory loss, numbness, weakness, restless legs, disinterest in activities. Pertinent negatives per HPI. All others negative.   Social History   Social History  . Marital status: Married    Spouse name: N/A  . Number of children: 2  . Years of education: 14   Occupational History  .       disabled   Social History Main Topics  . Smoking status: Never Smoker  . Smokeless tobacco: Never Used  . Alcohol use No  . Drug use: No  . Sexual activity: Not on file   Other Topics Concern  . Not on file   Social History Narrative   Lives with husband in home   Caffeine use - Coke 2 or 3 16 oz daily    Family History  Problem Relation Age of Onset  . Diabetes Mother   . Mental illness Mother   . Bipolar disorder Mother   . Hypertension Father   . Diabetes Maternal Grandmother   . Heart disease Neg Hx     Past Medical History:  Diagnosis Date  . Bipolar 1 disorder ([place])   . Cellulitis and abscess of foot 03/08/2015  . Diabetes mellitus    INSULIN DEPENDENT  . Diabetic neuropathy ([place])   . Diabetic neuropathy ([place])   . 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 H/O hiatal hernia   . Hyperlipidemia   . Hypertension    past hx of  . Iron malabsorption 09/26/2015  . Numbness and tingling    Hx; of in B/LLE and B/LUE  . Other iron deficiency anemias 09/20/2015    Past Surgical History:  Procedure Laterality Date  . ABDOMINAL HYSTERECTOMY    . ADENOIDECTOMY     Hx: of  . AMPUTATION  10/24/2011   Procedure: AMPUTATION RAY;  Surgeon: Newt Minion, MD;  Location: [place];  Service: Orthopedics;  Laterality: Right;  Right Foot 3rd Ray Amputation  . AMPUTATION Right 12/16/2012   Procedure: Right Foot Transmetatarsal Amputation;  Surgeon: Newt Minion, MD;  Location: [place];  Service: Orthopedics;  Laterality: Right;  . AMPUTATION Left 03/09/2015   Procedure: LEFT FOOT 1ST RAY AMPUTATION;  Surgeon: Newt Minion, MD;  Location: [place];  Service: Orthopedics;  Laterality: Left;  . BLADDER SURGERY     x 2, tacked 1st time; mesh "eroded", had to be removed  . Bladder Tact   2002  . BREAST SURGERY Left    "knot removed"  . CARPAL TUNNEL RELEASE Left   . COLON SURGERY    . NASAL SEPTUM SURGERY  1976  . TONSILLECTOMY     age 49's    Current Outpatient Prescriptions    Medication Sig Dispense Refill  . aspirin 81 MG tablet Take 81 mg by mouth daily.    Marland Kitchen atorvastatin (LIPITOR) 20 MG tablet Take 1 tablet (20 mg total) by mouth at bedtime. 30 tablet 5  . Blood Glucose Monitoring Suppl (FREESTYLE LITE) DEVI Use to check blood sugar 3 times a day dx code E11.39 1 each 0  . escitalopram (LEXAPRO) 10 MG tablet Take 1 tablet by mouth daily.    Marland Kitchen glucose blood (FREESTYLE LITE) test strip Use as instructed to check blood sugar 3 times a day dx code E11.39 100 each 3  . Insulin Disposable Pump (V-GO 30) KIT Use one per day 1 kit 2  . insulin regular (NOVOLIN R RELION) 100 units/mL injection Take 6 units with breakfast, 8 units with lunch and 8 units with dinner (  Patient taking differently: takes 68 units with V-go per day)) 10 mL 3  . INSULIN SYRINGE 1CC/29G 29G X 1/2" 1 ML MISC Use 1 per day. 100 each 2  . Lancets (FREESTYLE) lancets Use as instructed to check blood sugar 3 times per day dx code E11.39 100 each 3  . metFORMIN (GLUCOPHAGE) 500 MG tablet Take 1000 mg each morning and 500 mg each evening 90 tablet 5  . OVER THE COUNTER MEDICATION Centrum-Muli. Vitamin-Take 2 gummies every day.    Marland Kitchen REGRANEX 0.01 % gel AS DIRECTED    . gabapentin (NEURONTIN) 600 MG tablet 600 mg. Taking 2 tabs three times a day     No current facility-administered medications for this visit.     Allergies as of 01/03/2016 - Review Complete 01/03/2016  Allergen Reaction Noted  . Ativan [lorazepam] Other (See Comments) 10/23/2011  . Darvocet [propoxyphene n-acetaminophen] Itching 10/23/2011  . Demerol [meperidine] Other (See Comments) 10/23/2011  . Latex Itching 10/23/2011  . Lithium Nausea And Vomiting 03/08/2015  . Oxycodone Other (See Comments) 10/23/2011    Vitals: BP 132/71   Pulse 64   Ht '5\' 1"'  (1.549 m)   Wt 174 lb 3.2 oz (79 kg)   BMI 32.91 kg/m  Last Weight:  Wt Readings from Last 1 Encounters:  01/03/16 174 lb 3.2 oz (79 kg)   Last Height:   Ht Readings from  Last 1 Encounters:  01/03/16 '5\' 1"'  (1.549 m)    Physical exam: Exam: Gen: NAD, conversant, well nourised, obese, well groomed                     CV: RRR, no MRG. No Carotid Bruits. No peripheral edema, warm, nontender Eyes: Conjunctivae clear without exudates or hemorrhage  Neuro: Detailed Neurologic Exam  Speech:    Speech is normal; fluent and spontaneous with normal comprehension.  Cognition:    The patient is oriented to person, place, and time;     recent and remote memory intact;     language fluent;     normal attention, concentration,     fund of knowledge Cranial Nerves:    The pupils are equal, round, and reactive to light. Attempted funduscopic exam could not visualize due to small pupils Visual fields are full to finger confrontation. Extraocular movements are intact. Trigeminal sensation is intact and the muscles of mastication are normal. The face is symmetric. The palate elevates in the midline. Hearing intact. Voice is normal. Shoulder shrug is normal. The tongue has normal motion without fasciculations.   Coordination:    Normal finger to nose and heel to shin.   Gait:      Motor Observation: weak grip bilat and thenar muscle wasting    no involuntary movements noted. Tone:    Normal muscle tone.    Posture:    Posture is normal. normal erect    Strength: weak grip bilat and thenar muscle wasting Otherwise strength is V/V in the upper and lower limbs.      Sensation: decreased pin prick and temp to knees and wrists. Can feel vibration in the medial malleoli 5 seconds bilateral.      Reflex Exam:  DTR's: absent  patellar and ankle jerk deep tendon reflexes   Toes:    Amputated bilaterally  Clonus:    Clonus is absent.      Assessment/Plan:  60 year old female with years of uncontrolled diabetes here for neuropathy in the feet and hands. Likely major contributing factor  as uncontrolled diabetes.  She already has been referred to pain management  and she is following there for medication for pain. We do not provide chronic pain management and a pain center is better for this management Will work up for other causes of neuropathy and order an emg.ncs on the upper extremities to evaluate for CTS. Recommend alfa lipoic acid daily for diabetic neuropathy.400-666m daily.   Cc: MLeeanne Rio PA-C  ASarina Ill MD  [facility] [address] [address] Phone [phone] [phone]

## 2016-01-03 NOTE — Patient Instructions (Addendum)
As far as your medications are concerned, I would like to suggest: Follow with pain management. Alpha-lipoic Acid.  As far as diagnostic testing: emg/ncs, labs  I would like to see you back for emg/ncs, sooner if we need to. Please call us with any interim questions, concerns, problems, updates or refill requests.   Our phone number is [phone]. We also have an after hours call service for urgent matters and there is a physician on-call for urgent questions. For any emergencies you know to call 911 or go to the nearest emergency room

## 2016-01-03 NOTE — Telephone Encounter (Signed)
Left message to return call 

## 2016-01-03 NOTE — Telephone Encounter (Signed)
See previous note

## 2016-01-04 ENCOUNTER — Other Ambulatory Visit: Payer: PPO

## 2016-01-04 LAB — TSH: TSH: 1.59 u[IU]/mL (ref 0.450–4.500)

## 2016-01-04 LAB — B12 AND FOLATE PANEL
FOLATE: 17.1 ng/mL (ref >3.0)
VITAMIN B 12: 425 pg/mL (ref 211–946)

## 2016-01-07 ENCOUNTER — Telehealth: Payer: Self-pay | Admitting: *Deleted

## 2016-01-07 NOTE — Telephone Encounter (Signed)
Per Dr Jaynee Eagles, spoke with patient and informed her that her lab results were normal. She verbalized understanding, appreciation.

## 2016-01-08 ENCOUNTER — Ambulatory Visit: Payer: PPO | Admitting: Endocrinology

## 2016-01-08 DIAGNOSIS — M868X7 Other osteomyelitis, ankle and foot: Secondary | ICD-10-CM | POA: Diagnosis not present

## 2016-01-08 DIAGNOSIS — S91329A Laceration with foreign body, unspecified foot, initial encounter: Secondary | ICD-10-CM | POA: Diagnosis not present

## 2016-01-08 DIAGNOSIS — E11621 Type 2 diabetes mellitus with foot ulcer: Secondary | ICD-10-CM | POA: Diagnosis not present

## 2016-01-08 DIAGNOSIS — L97413 Non-pressure chronic ulcer of right heel and midfoot with necrosis of muscle: Secondary | ICD-10-CM | POA: Diagnosis not present

## 2016-01-08 DIAGNOSIS — L97423 Non-pressure chronic ulcer of left heel and midfoot with necrosis of muscle: Secondary | ICD-10-CM | POA: Diagnosis not present

## 2016-01-08 DIAGNOSIS — E1140 Type 2 diabetes mellitus with diabetic neuropathy, unspecified: Secondary | ICD-10-CM | POA: Diagnosis not present

## 2016-01-08 DIAGNOSIS — L97512 Non-pressure chronic ulcer of other part of right foot with fat layer exposed: Secondary | ICD-10-CM | POA: Diagnosis not present

## 2016-01-08 DIAGNOSIS — L97522 Non-pressure chronic ulcer of other part of left foot with fat layer exposed: Secondary | ICD-10-CM | POA: Diagnosis not present

## 2016-01-08 DIAGNOSIS — M86272 Subacute osteomyelitis, left ankle and foot: Secondary | ICD-10-CM | POA: Diagnosis not present

## 2016-01-09 ENCOUNTER — Ambulatory Visit: Payer: PPO | Admitting: Hematology & Oncology

## 2016-01-09 ENCOUNTER — Other Ambulatory Visit: Payer: PPO

## 2016-01-09 ENCOUNTER — Ambulatory Visit: Payer: PPO

## 2016-01-10 ENCOUNTER — Other Ambulatory Visit: Payer: PPO

## 2016-01-10 ENCOUNTER — Telehealth: Payer: Self-pay | Admitting: Physical Medicine & Rehabilitation

## 2016-01-10 NOTE — Telephone Encounter (Signed)
She should first speak with whomever is managing her restless leg syndrome for adjustment or other options for medications.  Regarding her phantom limb pain, we can prescribe her 75mg  of Elavil qHS.  Thanks

## 2016-01-10 NOTE — Telephone Encounter (Signed)
Patient went to pick up Lyrica at pharmacy and it was $287.00.  She can't afford that, is there anything else that can be prescribed for her?

## 2016-01-10 NOTE — Telephone Encounter (Signed)
I advised pt to take the Gabapentin 1200mg  TID instead of the Lyrica. However, she states she is starting to have leg cramps. It also is not as effective for her restless legs as it used to be. Is there anything else besides Gabapentin and Lyrica that she can try?

## 2016-01-17 ENCOUNTER — Other Ambulatory Visit: Payer: Self-pay | Admitting: *Deleted

## 2016-01-17 DIAGNOSIS — S91329A Laceration with foreign body, unspecified foot, initial encounter: Secondary | ICD-10-CM | POA: Diagnosis not present

## 2016-01-17 DIAGNOSIS — E1165 Type 2 diabetes mellitus with hyperglycemia: Principal | ICD-10-CM

## 2016-01-17 DIAGNOSIS — E1139 Type 2 diabetes mellitus with other diabetic ophthalmic complication: Secondary | ICD-10-CM

## 2016-01-17 DIAGNOSIS — L97523 Non-pressure chronic ulcer of other part of left foot with necrosis of muscle: Secondary | ICD-10-CM | POA: Diagnosis not present

## 2016-01-17 DIAGNOSIS — M868X7 Other osteomyelitis, ankle and foot: Secondary | ICD-10-CM | POA: Diagnosis not present

## 2016-01-17 DIAGNOSIS — E1140 Type 2 diabetes mellitus with diabetic neuropathy, unspecified: Secondary | ICD-10-CM | POA: Diagnosis not present

## 2016-01-17 DIAGNOSIS — IMO0002 Reserved for concepts with insufficient information to code with codable children: Secondary | ICD-10-CM

## 2016-01-17 DIAGNOSIS — L97513 Non-pressure chronic ulcer of other part of right foot with necrosis of muscle: Secondary | ICD-10-CM | POA: Diagnosis not present

## 2016-01-17 DIAGNOSIS — E11621 Type 2 diabetes mellitus with foot ulcer: Secondary | ICD-10-CM | POA: Diagnosis not present

## 2016-01-18 ENCOUNTER — Other Ambulatory Visit (INDEPENDENT_AMBULATORY_CARE_PROVIDER_SITE_OTHER): Payer: PPO

## 2016-01-18 DIAGNOSIS — E1165 Type 2 diabetes mellitus with hyperglycemia: Secondary | ICD-10-CM | POA: Diagnosis not present

## 2016-01-18 DIAGNOSIS — E1139 Type 2 diabetes mellitus with other diabetic ophthalmic complication: Secondary | ICD-10-CM

## 2016-01-18 DIAGNOSIS — IMO0002 Reserved for concepts with insufficient information to code with codable children: Secondary | ICD-10-CM

## 2016-01-18 LAB — COMPREHENSIVE METABOLIC PANEL
ALBUMIN: 3.5 g/dL (ref 3.5–5.2)
ALK PHOS: 100 U/L (ref 39–117)
ALT: 10 U/L (ref 0–35)
AST: 9 U/L (ref 0–37)
BILIRUBIN TOTAL: 0.3 mg/dL (ref 0.2–1.2)
BUN: 32 mg/dL — AB (ref 6–23)
CO2: 26 mEq/L (ref 19–32)
CREATININE: 1.03 mg/dL (ref 0.40–1.20)
Calcium: 9.1 mg/dL (ref 8.4–10.5)
Chloride: 102 mEq/L (ref 96–112)
GFR: 58.3 mL/min — ABNORMAL LOW (ref >60.00)
GLUCOSE: 211 mg/dL — AB (ref 70–99)
Potassium: 5.4 mEq/L — ABNORMAL HIGH (ref 3.5–5.1)
SODIUM: 136 meq/L (ref 135–145)
TOTAL PROTEIN: 8 g/dL (ref 6.0–8.3)

## 2016-01-18 LAB — HEMOGLOBIN A1C: HEMOGLOBIN A1C: 7.8 % — AB (ref 4.6–6.5)

## 2016-01-21 ENCOUNTER — Ambulatory Visit: Payer: PPO | Admitting: Physician Assistant

## 2016-01-24 ENCOUNTER — Ambulatory Visit (INDEPENDENT_AMBULATORY_CARE_PROVIDER_SITE_OTHER): Payer: PPO | Admitting: Endocrinology

## 2016-01-24 ENCOUNTER — Encounter: Payer: Self-pay | Admitting: Endocrinology

## 2016-01-24 VITALS — BP 116/58 | HR 88 | Temp 98.1°F | Resp 14 | Ht 61.0 in | Wt 174.2 lb

## 2016-01-24 DIAGNOSIS — Z794 Long term (current) use of insulin: Secondary | ICD-10-CM

## 2016-01-24 DIAGNOSIS — E11319 Type 2 diabetes mellitus with unspecified diabetic retinopathy without macular edema: Secondary | ICD-10-CM

## 2016-01-24 DIAGNOSIS — E1165 Type 2 diabetes mellitus with hyperglycemia: Secondary | ICD-10-CM | POA: Diagnosis not present

## 2016-01-24 MED ORDER — METFORMIN HCL 1000 MG PO TABS: ORAL_TABLET | ORAL | 5 refills | Status: DC

## 2016-01-24 NOTE — Progress Notes (Signed)
Patient ID: Dana [name], female   DOB: 01-03-1956, 60 y.o.   MRN: [id number]           Reason for Appointment: Follow-up for Type 2 Diabetes  Referring physician: Elyn Aquas  History of Present Illness:          Date of diagnosis of type 2 diabetes mellitus: 2006       Background history:   She thinks her blood sugar was 300-400 at the time of diagnosis and she was started on insulin soon after this. She thinks she has been on metformin only for the last 5 years Her A1c history is available since only about 2013 and this had been consistently over 10%  Recent history:    She  has had  poor control of her diabetes with pre-consultation A1c of 13%, was on Levemir  Because of her poor control and need for multiple injections and cost of brand name insulin she was started on the V-go pump after instruction by the nurse educator on 04/11/15  Her A1c is now 7.8, previously 7.5%  INSULIN regimen is:  V-go 30 units pump, 3 clicks before meals, 2-7P daily  Current blood sugar patterns, daily management and problems identified:   Although her A1c had come down to 7.1 after starting her V-go pump but is now getting progressively higher.  She appears to have high readings persistently throughout the day on her download  She does have mostly high readings in the late afternoon and evening's although somewhat variable.  Fasting readings are also mostly high.  She now says that she is drinking 3-4 glasses of regular drinks throughout the day.  Her metformin doses of 1500 mg a day with 500 mg in the evening.  Not had any side effects from this previously.  She has difficulty losing weight  Only rarely will have a low normal blood sugar at 6 PM.                         Non-insulin hypoglycemic drugs the patient is taking are: Metformin Side effects from medications have been: None  Compliance with the medical regimen: Good Hypoglycemia: none   Glucose monitoring:  done 2 times a  day         Glucometer:  FreeStyle .      Blood Glucose readings by time of day  Mean values apply above for all meters except median for One Touch  PRE-MEAL Fasting Lunch Dinner Bedtime Overall  Glucose range: 132-200   74-296 112-335   Mean/median: 163    182    Self-care:  Typical meal intake: Breakfast is irregular otherwise may have toast and an egg or sausage.  Lunch is a sandwich or cheeseburger, evening meal is meat and 2 vegetables.  For snacks he will have peanut butter crackers in pms Dinner 7 pm     She has regular drinks 3-4 per day           Dietician visit, most recent: Never               Exercise: none  Weight history: Stable over the last 3-4 years, was 400 pounds about 5 years ago  Wt Readings from Last 3 Encounters:  01/24/16 174 lb 3.2 oz (79 kg)  01/03/16 174 lb 3.2 oz (79 kg)  11/07/15 174 lb (78.9 kg)    Glycemic control:   Lab Results  Component Value Date   HGBA1C 7.8 (  H) 01/18/2016   HGBA1C 7.5 09/20/2015   HGBA1C 7.1 06/06/2015   Lab Results  Component Value Date   MICROALBUR 4.0 (H) 11/08/2014   LDLCALC 57 03/09/2015   CREATININE 1.03 01/18/2016         Medication List       Accurate as of 01/24/16  9:55 PM. Always use your most recent med list.          aspirin 81 MG tablet Take 81 mg by mouth daily.   atorvastatin 20 MG tablet Commonly known as:  LIPITOR Take 1 tablet (20 mg total) by mouth at bedtime.   escitalopram 10 MG tablet Commonly known as:  LEXAPRO Take 1 tablet by mouth daily.   freestyle lancets Use as instructed to check blood sugar 3 times per day dx code E11.39   FREESTYLE LITE Devi Use to check blood sugar 3 times a day dx code E11.39   gabapentin 600 MG tablet Commonly known as:  NEURONTIN 600 mg. Taking 2 tabs three times a day   glucose blood test strip Commonly known as:  FREESTYLE LITE Use as instructed to check blood sugar 3 times a day dx code E11.39   insulin regular 100 units/mL  injection Commonly known as:  NOVOLIN R RELION Take 6 units with breakfast, 8 units with lunch and 8 units with dinner   INSULIN SYRINGE 1CC/29G 29G X 1/2" 1 ML Misc Use 1 per day.   metFORMIN 1000 MG tablet Commonly known as:  GLUCOPHAGE 1 bid   OVER THE COUNTER MEDICATION Centrum-Muli. Vitamin-Take 2 gummies every day.   REGRANEX 0.01 % gel Generic drug:  becaplermin AS DIRECTED   V-GO 30 Kit Use one per day       Allergies:  Allergies  Allergen Reactions  . Ativan [Lorazepam] Other (See Comments)    Abnormal behavior  . Darvocet [Propoxyphene N-Acetaminophen] Itching  . Demerol [Meperidine] Other (See Comments)    Abnormal behavior  . Latex Itching  . Lithium Nausea And Vomiting    Can not keep this medication down. It makes her terribly ill.  . Oxycodone Other (See Comments)    Abnormal behavior    Past Medical History:  Diagnosis Date  . Bipolar 1 disorder ([place])   . Cellulitis and abscess of foot 03/08/2015  . Diabetes mellitus    INSULIN DEPENDENT  . Diabetic neuropathy ([place])   . Diabetic neuropathy ([place])   . 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 H/O hiatal hernia   . Hyperlipidemia   . Hypertension    past hx of  . Iron malabsorption 09/26/2015  . Numbness and tingling    Hx; of in B/LLE and B/LUE  . Other iron deficiency anemias 09/20/2015    Past Surgical History:  Procedure Laterality Date  . ABDOMINAL HYSTERECTOMY    . ADENOIDECTOMY     Hx: of  . AMPUTATION  10/24/2011   Procedure: AMPUTATION RAY;  Surgeon: Newt Minion, MD;  Location: [place];  Service: Orthopedics;  Laterality: Right;  Right Foot 3rd Ray Amputation  . AMPUTATION Right 12/16/2012   Procedure: Right Foot Transmetatarsal Amputation;  Surgeon: Newt Minion, MD;  Location: [place];  Service: Orthopedics;  Laterality: Right;  . AMPUTATION Left 03/09/2015   Procedure: LEFT FOOT 1ST RAY AMPUTATION;  Surgeon: Newt Minion, MD;  Location: [place];  Service: Orthopedics;   Laterality: Left;  . BLADDER SURGERY     x 2, tacked 1st time; mesh "eroded", had to be  removed  . Bladder Tact   2002  . BREAST SURGERY Left    "knot removed"  . CARPAL TUNNEL RELEASE Left   . COLON SURGERY    . NASAL SEPTUM SURGERY  1976  . TONSILLECTOMY     age 20's    Family History  Problem Relation Age of Onset  . Diabetes Mother   . Mental illness Mother   . Bipolar disorder Mother   . Hypertension Father   . Diabetes Maternal Grandmother   . Heart disease Neg Hx     Social History:  reports that she has never smoked. She has never used smokeless tobacco. She reports that she does not drink alcohol or use drugs.    Review of Systems   DIABETIC foot ulcers with history of amputations: She is being followed at the wound Center with bilateral ulcers That are healing slowly  She is complaining of extra sensitivity of her feet when trying to stand or walk.  Feels like she is walking on rocks Has been on gabapentin and analgesics from PCP Could not tolerate Cymbalta She did benefit from a trial of capsaicin that was suggested but she is using this irregularly   Lipid history: On treatment with Lipitor for about a year with the following results    Lab Results  Component Value Date   CHOL 103 03/09/2015   HDL 27 (L) 03/09/2015   LDLCALC 57 03/09/2015   TRIG 96 03/09/2015   CHOLHDL 3.8 03/09/2015          Last diabetic foot exam was in 03/2015, she does have History of neuropathy with sensory loss   Physical Examination:  BP (!) 116/58   Pulse 88   Temp 98.1 F (36.7 C)   Resp 14   Ht _0  (1.549 m)   Wt 174 lb 3.2 oz (79 kg)   SpO2 98%   BMI 32.91 kg/m         ASSESSMENT:  Diabetes type 2, uncontrolled     See history of present illness for detailed discussion of his current management, blood sugar patterns and problems identified  As discussed above her A1c is progressively rising. Her blood sugars averaging 182  at home with monitoring mostly  in the mornings and some late in the evenings. Although her blood sugars were better controlled initially with starting the V-go pump they are not improving. This may be partly related to her inconsistent diet. She is probably having significant hyperglycemia throughout the day with drinking regular soft drinks. Also is relatively inactive. Not on maximum dose of metformin, previously had no side effects from this.  Recommendations: She will cut out regular soft drinks as much is possible.  However she thinks she cannot stand diet drinks suggested that she can try to at least dilute her drinks with water. Generic exercises given. Add another 500 mg metformin in the evening Take extra 1-2 clicks for higher readings above 150. Will need short-term follow-up to make sure she is back on target and also consider consultation with dietitian. For her neuropathy she can try taking capsaicin again topically and emphasized the need to take this regularly for better effect With using the V-go pump and 30 units basal and relatively small doses at mealtimes her blood sugars have been improving   Patient Instructions  Dilute soft drinks with water  Add 1 more Metformin at dinner  Capsaicin twice daily     Counseling time on subjects discussed above is over  50% of today's 25 minute visit  Anaiah Mcmannis 01/24/2016, 9:55 PM   Note: This office note was prepared with Estate agent. Any transcriptional errors that result from this process are unintentional.

## 2016-01-24 NOTE — Patient Instructions (Addendum)
Dilute soft drinks with water  Add 1 more Metformin at dinner  Capsaicin twice daily

## 2016-01-31 DIAGNOSIS — Z79899 Other long term (current) drug therapy: Secondary | ICD-10-CM | POA: Diagnosis not present

## 2016-01-31 DIAGNOSIS — M86272 Subacute osteomyelitis, left ankle and foot: Secondary | ICD-10-CM | POA: Diagnosis not present

## 2016-01-31 DIAGNOSIS — E11621 Type 2 diabetes mellitus with foot ulcer: Secondary | ICD-10-CM | POA: Diagnosis not present

## 2016-01-31 DIAGNOSIS — L97513 Non-pressure chronic ulcer of other part of right foot with necrosis of muscle: Secondary | ICD-10-CM | POA: Diagnosis not present

## 2016-01-31 DIAGNOSIS — S91329A Laceration with foreign body, unspecified foot, initial encounter: Secondary | ICD-10-CM | POA: Diagnosis not present

## 2016-02-01 DIAGNOSIS — S91329A Laceration with foreign body, unspecified foot, initial encounter: Secondary | ICD-10-CM | POA: Diagnosis not present

## 2016-02-03 ENCOUNTER — Emergency Department (HOSPITAL_BASED_OUTPATIENT_CLINIC_OR_DEPARTMENT_OTHER): Payer: PPO

## 2016-02-03 ENCOUNTER — Encounter (HOSPITAL_BASED_OUTPATIENT_CLINIC_OR_DEPARTMENT_OTHER): Payer: Self-pay | Admitting: Emergency Medicine

## 2016-02-03 ENCOUNTER — Inpatient Hospital Stay (HOSPITAL_BASED_OUTPATIENT_CLINIC_OR_DEPARTMENT_OTHER)
Admission: EM | Admit: 2016-02-03 | Discharge: 2016-02-08 | DRG: 872 | Discharge status: Against Medical Advice | Payer: PPO | Attending: Family Medicine | Admitting: Family Medicine

## 2016-02-03 DIAGNOSIS — A4101 Sepsis due to Methicillin susceptible Staphylococcus aureus: Secondary | ICD-10-CM | POA: Diagnosis not present

## 2016-02-03 DIAGNOSIS — R7881 Bacteremia: Secondary | ICD-10-CM | POA: Diagnosis not present

## 2016-02-03 DIAGNOSIS — F319 Bipolar disorder, unspecified: Secondary | ICD-10-CM | POA: Diagnosis not present

## 2016-02-03 DIAGNOSIS — Z87440 Personal history of urinary (tract) infections: Secondary | ICD-10-CM

## 2016-02-03 DIAGNOSIS — Z89431 Acquired absence of right foot: Secondary | ICD-10-CM | POA: Diagnosis not present

## 2016-02-03 DIAGNOSIS — E1139 Type 2 diabetes mellitus with other diabetic ophthalmic complication: Secondary | ICD-10-CM | POA: Diagnosis not present

## 2016-02-03 DIAGNOSIS — E1165 Type 2 diabetes mellitus with hyperglycemia: Secondary | ICD-10-CM | POA: Diagnosis not present

## 2016-02-03 DIAGNOSIS — R112 Nausea with vomiting, unspecified: Secondary | ICD-10-CM

## 2016-02-03 DIAGNOSIS — L97519 Non-pressure chronic ulcer of other part of right foot with unspecified severity: Secondary | ICD-10-CM | POA: Diagnosis present

## 2016-02-03 DIAGNOSIS — D508 Other iron deficiency anemias: Secondary | ICD-10-CM | POA: Diagnosis not present

## 2016-02-03 DIAGNOSIS — N390 Urinary tract infection, site not specified: Secondary | ICD-10-CM | POA: Diagnosis present

## 2016-02-03 DIAGNOSIS — E08621 Diabetes mellitus due to underlying condition with foot ulcer: Secondary | ICD-10-CM | POA: Diagnosis not present

## 2016-02-03 DIAGNOSIS — M868X7 Other osteomyelitis, ankle and foot: Secondary | ICD-10-CM | POA: Diagnosis not present

## 2016-02-03 DIAGNOSIS — B9561 Methicillin susceptible Staphylococcus aureus infection as the cause of diseases classified elsewhere: Secondary | ICD-10-CM | POA: Diagnosis not present

## 2016-02-03 DIAGNOSIS — J9811 Atelectasis: Secondary | ICD-10-CM | POA: Diagnosis present

## 2016-02-03 DIAGNOSIS — Z9641 Presence of insulin pump (external) (internal): Secondary | ICD-10-CM | POA: Diagnosis not present

## 2016-02-03 DIAGNOSIS — Z23 Encounter for immunization: Secondary | ICD-10-CM | POA: Diagnosis not present

## 2016-02-03 DIAGNOSIS — IMO0002 Reserved for concepts with insufficient information to code with codable children: Secondary | ICD-10-CM | POA: Diagnosis present

## 2016-02-03 DIAGNOSIS — E11621 Type 2 diabetes mellitus with foot ulcer: Secondary | ICD-10-CM | POA: Diagnosis not present

## 2016-02-03 DIAGNOSIS — N179 Acute kidney failure, unspecified: Secondary | ICD-10-CM | POA: Diagnosis present

## 2016-02-03 DIAGNOSIS — Z794 Long term (current) use of insulin: Secondary | ICD-10-CM

## 2016-02-03 DIAGNOSIS — Z79899 Other long term (current) drug therapy: Secondary | ICD-10-CM | POA: Diagnosis not present

## 2016-02-03 DIAGNOSIS — L97529 Non-pressure chronic ulcer of other part of left foot with unspecified severity: Secondary | ICD-10-CM | POA: Diagnosis present

## 2016-02-03 DIAGNOSIS — L02611 Cutaneous abscess of right foot: Secondary | ICD-10-CM | POA: Diagnosis not present

## 2016-02-03 DIAGNOSIS — K449 Diaphragmatic hernia without obstruction or gangrene: Secondary | ICD-10-CM | POA: Diagnosis not present

## 2016-02-03 DIAGNOSIS — E785 Hyperlipidemia, unspecified: Secondary | ICD-10-CM | POA: Diagnosis present

## 2016-02-03 DIAGNOSIS — L97509 Non-pressure chronic ulcer of other part of unspecified foot with unspecified severity: Secondary | ICD-10-CM

## 2016-02-03 DIAGNOSIS — Z89412 Acquired absence of left great toe: Secondary | ICD-10-CM | POA: Diagnosis not present

## 2016-02-03 DIAGNOSIS — D649 Anemia, unspecified: Secondary | ICD-10-CM

## 2016-02-03 DIAGNOSIS — E1140 Type 2 diabetes mellitus with diabetic neuropathy, unspecified: Secondary | ICD-10-CM | POA: Diagnosis present

## 2016-02-03 DIAGNOSIS — E1169 Type 2 diabetes mellitus with other specified complication: Secondary | ICD-10-CM | POA: Diagnosis not present

## 2016-02-03 DIAGNOSIS — Z7982 Long term (current) use of aspirin: Secondary | ICD-10-CM

## 2016-02-03 DIAGNOSIS — E860 Dehydration: Secondary | ICD-10-CM | POA: Diagnosis not present

## 2016-02-03 DIAGNOSIS — A419 Sepsis, unspecified organism: Secondary | ICD-10-CM | POA: Diagnosis not present

## 2016-02-03 DIAGNOSIS — E11311 Type 2 diabetes mellitus with unspecified diabetic retinopathy with macular edema: Secondary | ICD-10-CM | POA: Diagnosis not present

## 2016-02-03 DIAGNOSIS — D631 Anemia in chronic kidney disease: Secondary | ICD-10-CM | POA: Diagnosis present

## 2016-02-03 DIAGNOSIS — M86171 Other acute osteomyelitis, right ankle and foot: Secondary | ICD-10-CM | POA: Diagnosis not present

## 2016-02-03 LAB — COMPREHENSIVE METABOLIC PANEL
ALT: 21 U/L (ref 14–54)
ANION GAP: 11 (ref 5–15)
AST: 17 U/L (ref 15–41)
Albumin: 2.3 g/dL — ABNORMAL LOW (ref 3.5–5.0)
Alkaline Phosphatase: 207 U/L — ABNORMAL HIGH (ref 38–126)
BILIRUBIN TOTAL: 1 mg/dL (ref 0.3–1.2)
BUN: 45 mg/dL — ABNORMAL HIGH (ref 6–20)
CO2: 21 mmol/L — ABNORMAL LOW (ref 22–32)
Calcium: 8.9 mg/dL (ref 8.9–10.3)
Chloride: 100 mmol/L — ABNORMAL LOW (ref 101–111)
Creatinine, Ser: 1.32 mg/dL — ABNORMAL HIGH (ref 0.44–1.00)
GFR calc Af Amer: 50 mL/min — ABNORMAL LOW (ref >60)
GFR, EST NON AFRICAN AMERICAN: 43 mL/min — AB (ref >60)
Glucose, Bld: 257 mg/dL — ABNORMAL HIGH (ref 65–99)
POTASSIUM: 4.4 mmol/L (ref 3.5–5.1)
Sodium: 132 mmol/L — ABNORMAL LOW (ref 135–145)
TOTAL PROTEIN: 7.6 g/dL (ref 6.5–8.1)

## 2016-02-03 LAB — URINALYSIS, ROUTINE W REFLEX MICROSCOPIC
GLUCOSE, UA: 100 mg/dL — AB
Ketones, ur: 15 mg/dL — AB
Nitrite: NEGATIVE
Protein, ur: 100 mg/dL — AB
SPECIFIC GRAVITY, URINE: 1.02 (ref 1.005–1.030)
pH: 5.5 (ref 5.0–8.0)

## 2016-02-03 LAB — GLUCOSE, CAPILLARY: GLUCOSE-CAPILLARY: 196 mg/dL — AB (ref 65–99)

## 2016-02-03 LAB — CBC WITH DIFFERENTIAL/PLATELET
BASOS ABS: 0 10*3/uL (ref 0.0–0.1)
Band Neutrophils: 7 %
Basophils Relative: 0 %
Eosinophils Absolute: 0 10*3/uL (ref 0.0–0.7)
Eosinophils Relative: 0 %
HCT: 21.7 % — ABNORMAL LOW (ref 36.0–46.0)
HEMOGLOBIN: 7.1 g/dL — AB (ref 12.0–15.0)
LYMPHS ABS: 0.8 10*3/uL (ref 0.7–4.0)
LYMPHS PCT: 6 %
MCH: 29.2 pg (ref 26.0–34.0)
MCHC: 32.7 g/dL (ref 30.0–36.0)
MCV: 89.3 fL (ref 78.0–100.0)
METAMYELOCYTES PCT: 1 %
MONO ABS: 0.8 10*3/uL (ref 0.1–1.0)
MONOS PCT: 6 %
Neutro Abs: 10.9 10*3/uL — ABNORMAL HIGH (ref 1.7–7.7)
Neutrophils Relative %: 80 %
PLATELETS: 503 10*3/uL — AB (ref 150–400)
RBC: 2.43 MIL/uL — AB (ref 3.87–5.11)
RDW: 13.6 % (ref 11.5–15.5)
WBC: 12.5 10*3/uL — AB (ref 4.0–10.5)

## 2016-02-03 LAB — I-STAT CG4 LACTIC ACID, ED: LACTIC ACID, VENOUS: 0.62 mmol/L (ref 0.5–1.9)

## 2016-02-03 LAB — CBG MONITORING, ED: GLUCOSE-CAPILLARY: 215 mg/dL — AB (ref 65–99)

## 2016-02-03 LAB — URINE MICROSCOPIC-ADD ON

## 2016-02-03 MED ORDER — ACETAMINOPHEN 325 MG PO TABS
650.0000 mg | ORAL_TABLET | Freq: Once | ORAL | Status: AC | PRN
  Administered 2016-02-03: 650 mg via ORAL
  Filled 2016-02-03: qty 2

## 2016-02-03 MED ORDER — ASPIRIN 81 MG PO CHEW
81.0000 mg | CHEWABLE_TABLET | Freq: Every day | ORAL | Status: DC
  Administered 2016-02-04 – 2016-02-08 (×5): 81 mg via ORAL
  Filled 2016-02-03 (×5): qty 1

## 2016-02-03 MED ORDER — METFORMIN HCL 500 MG PO TABS: 1000.0000 mg | ORAL_TABLET | Freq: Every day | ORAL | Status: DC

## 2016-02-03 MED ORDER — IBUPROFEN 600 MG PO TABS: 600.0000 mg | ORAL_TABLET | Freq: Three times a day (TID) | ORAL | Status: DC | PRN

## 2016-02-03 MED ORDER — DEXTROSE 5 % IV SOLN
1.0000 g | INTRAVENOUS | Status: DC
  Filled 2016-02-03: qty 10

## 2016-02-03 MED ORDER — ATORVASTATIN CALCIUM 20 MG PO TABS
20.0000 mg | ORAL_TABLET | Freq: Every day | ORAL | Status: DC
  Administered 2016-02-04 – 2016-02-07 (×5): 20 mg via ORAL
  Filled 2016-02-03 (×5): qty 1

## 2016-02-03 MED ORDER — ONDANSETRON HCL 4 MG/2ML IJ SOLN
4.0000 mg | Freq: Once | INTRAMUSCULAR | Status: AC
  Administered 2016-02-03: 4 mg via INTRAVENOUS
  Filled 2016-02-03: qty 2

## 2016-02-03 MED ORDER — SODIUM CHLORIDE 0.9 % IV BOLUS (SEPSIS)
500.0000 mL | Freq: Once | INTRAVENOUS | Status: AC
  Administered 2016-02-03: 500 mL via INTRAVENOUS

## 2016-02-03 MED ORDER — DEXTROSE 5 % IV SOLN
1.0000 g | Freq: Once | INTRAVENOUS | Status: AC
  Administered 2016-02-03: 1 g via INTRAVENOUS
  Filled 2016-02-03: qty 10

## 2016-02-03 MED ORDER — PANTOPRAZOLE SODIUM 40 MG PO TBEC
40.0000 mg | DELAYED_RELEASE_TABLET | Freq: Every day | ORAL | Status: DC
  Administered 2016-02-04 – 2016-02-08 (×5): 40 mg via ORAL
  Filled 2016-02-03 (×5): qty 1

## 2016-02-03 MED ORDER — GABAPENTIN 600 MG PO TABS
1200.0000 mg | ORAL_TABLET | Freq: Three times a day (TID) | ORAL | Status: DC
  Administered 2016-02-04 – 2016-02-08 (×14): 1200 mg via ORAL
  Filled 2016-02-03 (×14): qty 2

## 2016-02-03 MED ORDER — SODIUM CHLORIDE 0.9 % IV BOLUS (SEPSIS)
1000.0000 mL | Freq: Once | INTRAVENOUS | Status: AC
  Administered 2016-02-03: 1000 mL via INTRAVENOUS

## 2016-02-03 MED ORDER — INSULIN ASPART 100 UNIT/ML ~~LOC~~ SOLN
0.0000 [IU] | Freq: Three times a day (TID) | SUBCUTANEOUS | Status: DC
  Administered 2016-02-04: 3 [IU] via SUBCUTANEOUS
  Administered 2016-02-04: 8 [IU] via SUBCUTANEOUS
  Administered 2016-02-04: 3 [IU] via SUBCUTANEOUS
  Administered 2016-02-05 – 2016-02-06 (×4): 2 [IU] via SUBCUTANEOUS

## 2016-02-03 MED ORDER — FENTANYL CITRATE (PF) 100 MCG/2ML IJ SOLN
100.0000 ug | Freq: Once | INTRAMUSCULAR | Status: DC
  Filled 2016-02-03: qty 2

## 2016-02-03 MED ORDER — ACETAMINOPHEN 325 MG PO TABS
650.0000 mg | ORAL_TABLET | Freq: Four times a day (QID) | ORAL | Status: AC | PRN
  Administered 2016-02-04: 650 mg via ORAL
  Filled 2016-02-03: qty 2

## 2016-02-03 MED ORDER — ONDANSETRON HCL 4 MG/2ML IJ SOLN
4.0000 mg | Freq: Four times a day (QID) | INTRAMUSCULAR | Status: DC | PRN
  Administered 2016-02-04 – 2016-02-06 (×4): 4 mg via INTRAVENOUS
  Filled 2016-02-03 (×4): qty 2

## 2016-02-03 MED ORDER — ESCITALOPRAM OXALATE 10 MG PO TABS
10.0000 mg | ORAL_TABLET | Freq: Every day | ORAL | Status: DC
  Administered 2016-02-04 – 2016-02-08 (×5): 10 mg via ORAL
  Filled 2016-02-03 (×5): qty 1

## 2016-02-03 MED ORDER — SODIUM CHLORIDE 0.9 % IV SOLN
INTRAVENOUS | Status: DC
  Administered 2016-02-04 – 2016-02-08 (×5): via INTRAVENOUS

## 2016-02-03 NOTE — H&P (Signed)
History and Physical    Dana [name] WGN:[id number] DOB: 01-22-1956 DOA: 02/03/2016  PCP: [name], PA-C   Patient coming from: Home.  Chief Complaint: Nausea and vomiting.  HPI: Dana [name] is a 60 y.o. female with medical history significant of bipolar disorder, insulin-dependent diabetes mellitus, diabetic neuropathy, chronic diabetic foot ulcers, erythropoietin deficiency anemia, hiatal hernia, hyperlipidemia, hypertension, iron malabsorption (receives iron infusion therapy PRN), chronic diabetic foot ulcers, multiple UTIs who is coming to the hospital transferred from [facility] for nausea and vomiting since Friday, worsening fatigue, fever today.  Per patient, she did not feel well Friday morning. She felt fatigued, mildly lightheaded and nauseous. She states that she subsequently had an episode of emesis. She continue having nausea and fatigue through the weekend, had 2 episodes of emesis yesterday and 2 more this morning. She mentions that she felt chills today and subsequently developed a 101F temperature, at that point the patient decided to go to the emergency department. She also complains of occasional left frontal parietal headache, but denies sore throat, productive cough, chest pain, palpitations, diaphoresis, abdominal pain, diarrhea, constipation, melena or hematochezia. She denies flank pain, dysuria, frequency, hematuria or urgency. She reports that her urine had a very foul-smelling odor and that her symptoms are typical of her urine tract infection episodes.  The patient underwent curettage of her right foot ulcer on Thursday. Ulcers continued to drain, but the patient reports of they look better than they have previously,.  ED Course:  The patient received 1.5 L normal saline IV bolus, acetaminophen, antiemetics, Rocephin 1 g IV piggyback. She states that she feels better now. Workup showed urine analysis with mild pyuria and bacteriuria, WBC of 12.5,  hemoglobin level VII.1 g/dL. BUN 45, creatinine 1.32 and glucose of 257 mg/dL. Her albumin level was 2.3 g/dL. Her chest radiograph showed bibasilar atelectasis.  Review of Systems: As per HPI otherwise 10 point review of systems negative.        Past Medical History:  Diagnosis Date  . Bipolar 1 disorder ([place])   . Cellulitis and abscess of foot 03/08/2015  . Diabetes mellitus    INSULIN DEPENDENT  . Diabetic neuropathy ([place])   . Diabetic neuropathy ([place])   . 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 H/O hiatal hernia   . Hyperlipidemia   . Hypertension    past hx of  . Iron malabsorption 09/26/2015  . Numbness and tingling    Hx; of in B/LLE and B/LUE  . Other iron deficiency anemias 09/20/2015         Past Surgical History:  Procedure Laterality Date  . ABDOMINAL HYSTERECTOMY    . ADENOIDECTOMY     Hx: of  . AMPUTATION  10/24/2011   Procedure: AMPUTATION RAY;  Surgeon: Newt Minion, MD;  Location: [place];  Service: Orthopedics;  Laterality: Right;  Right Foot 3rd Ray Amputation  . AMPUTATION Right 12/16/2012   Procedure: Right Foot Transmetatarsal Amputation;  Surgeon: Newt Minion, MD;  Location: [place];  Service: Orthopedics;  Laterality: Right;  . AMPUTATION Left 03/09/2015   Procedure: LEFT FOOT 1ST RAY AMPUTATION;  Surgeon: Newt Minion, MD;  Location: [place];  Service: Orthopedics;  Laterality: Left;  . BLADDER SURGERY     x 2, tacked 1st time; mesh "eroded", had to be removed  . Bladder Tact   2002  . BREAST SURGERY Left    "knot removed"  . CARPAL TUNNEL RELEASE Left   . COLON SURGERY    .  NASAL SEPTUM SURGERY  1976  . TONSILLECTOMY     age 62's     reports that she has never smoked. She has never used smokeless tobacco. She reports that she does not drink alcohol or use drugs.       Allergies  Allergen Reactions  . Ativan [Lorazepam] Other (See Comments)    Abnormal behavior  . Darvocet [Propoxyphene  N-Acetaminophen] Itching  . Demerol [Meperidine] Other (See Comments)    Abnormal behavior  . Latex Itching  . Lithium Nausea And Vomiting    Can not keep this medication down. It makes her terribly ill.  . Oxycodone Other (See Comments)    Abnormal behavior         Family History  Problem Relation Age of Onset  . Diabetes Mother   . Mental illness Mother   . Bipolar disorder Mother   . Hypertension Father   . Diabetes Maternal Grandmother   . Heart disease Neg Hx            Prior to Admission medications   Medication Sig Start Date End Date Taking? Authorizing Provider  aspirin 81 MG tablet Take 81 mg by mouth daily.   Yes Historical Provider, MD  atorvastatin (LIPITOR) 20 MG tablet Take 1 tablet (20 mg total) by mouth at bedtime. 11/02/15  Yes Brunetta Jeans, PA-C  Blood Glucose Monitoring Suppl (FREESTYLE LITE) DEVI Use to check blood sugar 3 times a day dx code E11.39 05/03/15  Yes Elayne Snare, MD  escitalopram (LEXAPRO) 10 MG tablet Take 1 tablet by mouth daily. 09/07/15  Yes Historical Provider, MD  gabapentin (NEURONTIN) 600 MG tablet Take 1,200 mg by mouth 3 (three) times daily. Taking 2 tabs three times a day 12/28/15  Yes Historical Provider, MD  glucose blood (FREESTYLE LITE) test strip Use as instructed to check blood sugar 3 times a day dx code E11.39 12/10/15  Yes Elayne Snare, MD  ibuprofen (ADVIL,MOTRIN) 600 MG tablet Take 600 mg by mouth every 8 (eight) hours as needed.   Yes Historical Provider, MD  insulin regular (NOVOLIN R RELION) 100 units/mL injection Take 6 units with breakfast, 8 units with lunch and 8 units with dinner Patient taking differently: Inject 68 Units into the skin. takes 68 units with V-go per day) 04/16/15  Yes Elayne Snare, MD  INSULIN SYRINGE 1CC/29G 29G X 1/2" 1 ML MISC Use 1 per day. 12/26/15  Yes Elayne Snare, MD  Lancets (FREESTYLE) lancets Use as instructed to check blood sugar 3 times per day dx code E11.39 05/03/15   Yes Elayne Snare, MD  metFORMIN (GLUCOPHAGE) 1000 MG tablet 1 bid 01/24/16  Yes Elayne Snare, MD  OVER THE COUNTER MEDICATION Centrum-Muli. Vitamin-Take 2 gummies every day.   Yes Historical Provider, MD  Insulin Disposable Pump (V-GO 30) KIT Use one per day 03/28/15   Elayne Snare, MD  REGRANEX 0.01 % gel AS DIRECTED 07/11/15   Historical Provider, MD    Physical Exam:  Constitutional: NAD, calm, comfortable. Looks chronically ill.       Vitals:   02/03/16 2000 02/03/16 2010 02/03/16 2015 02/03/16 2132  BP: 102/62  103/55 107/73  Pulse: 83  82 82  Resp:   15 18  Temp:  99.1 F (37.3 C)  99.2 F (37.3 C)  TempSrc:  Oral  Oral  SpO2: 99%  98% 100%  Weight:    78.7 kg (173 lb 9.6 oz)  Height:    5' 1" (1.549 m)  Eyes: PERRL, lids and conjunctivae are pale. ENMT: Mucous membranes are moist. Posterior pharynx clear of any exudate or lesions. Neck: normal, supple, no masses, no thyromegaly Respiratory: clear to auscultation bilaterally, no wheezing, no crackles. Normal respiratory effort. No accessory muscle use.  Cardiovascular: Regular rate and rhythm, no murmurs / rubs / gallops. No extremity edema. Decreased pedal pulses. No carotid bruits.  Abdomen: Softr, no tenderness, no masses palpated. No hepatosplenomegaly. Bowel sounds positive.  Musculoskeletal: no clubbing / cyanosis. . Decreased ROM ankles and MTP joints due to ulcers and amputation. No contractures. Normal muscle tone.  Skin: Bilateral plantar diabetic ulcers with serous discharge. Right 5x4 cm,  Left 3x2 cm. Mild erythema surrounds right ulcer. Neurologic: CN 2-12 grossly intact. Sensation intact, DTR normal. Strength 5/5 in all 4.  Psychiatric: Normal judgment and insight. Alert and oriented x 4. Normal mood.     Labs on Admission: I have personally reviewed following labs and imaging studies  CBC:  Last Labs    Recent Labs Lab 02/03/16 1635  WBC 12.5*  NEUTROABS 10.9*  HGB 7.1*    HCT 21.7*  MCV 89.3  PLT 503*     Basic Metabolic Panel:  Last Labs    Recent Labs Lab 02/03/16 1635  NA 132*  K 4.4  CL 100*  CO2 21*  GLUCOSE 257*  BUN 45*  CREATININE 1.32*  CALCIUM 8.9     GFR: Estimated Creatinine Clearance: 43.6 mL/min (by C-G formula based on SCr of 1.32 mg/dL (H)). Liver Function Tests:  Last Labs    Recent Labs Lab 02/03/16 1635  AST 17  ALT 21  ALKPHOS 207*  BILITOT 1.0  PROT 7.6  ALBUMIN 2.3*     Last Labs   No results for input(s): LIPASE, AMYLASE in the last 168 hours.   Last Labs   No results for input(s): AMMONIA in the last 168 hours.   Coagulation Profile: Last Labs   No results for input(s): INR, PROTIME in the last 168 hours.   Cardiac Enzymes: Last Labs   No results for input(s): CKTOTAL, CKMB, CKMBINDEX, TROPONINI in the last 168 hours.   BNP (last 3 results) Recent Labs (within last 365 days)  No results for input(s): PROBNP in the last 8760 hours.   HbA1C: Recent Labs (last 2 labs)   No results for input(s): HGBA1C in the last 72 hours.   CBG:  Last Labs    Recent Labs Lab 02/03/16 2017 02/03/16 2131  GLUCAP 215* 196*     Lipid Profile: Recent Labs (last 2 labs)   No results for input(s): CHOL, HDL, LDLCALC, TRIG, CHOLHDL, LDLDIRECT in the last 72 hours.   Thyroid Function Tests: Recent Labs (last 2 labs)   No results for input(s): TSH, T4TOTAL, FREET4, T3FREE, THYROIDAB in the last 72 hours.   Anemia Panel: Recent Labs (last 2 labs)   No results for input(s): VITAMINB12, FOLATE, FERRITIN, TIBC, IRON, RETICCTPCT in the last 72 hours.   Urine analysis: Labs (Brief)          Component Value Date/Time   COLORURINE AMBER (A) 02/03/2016 1800   APPEARANCEUR CLOUDY (A) 02/03/2016 1800   LABSPEC 1.020 02/03/2016 1800   PHURINE 5.5 02/03/2016 1800   GLUCOSEU 100 (A) 02/03/2016 1800   GLUCOSEU NEGATIVE 11/26/2015 1404   HGBUR LARGE (A) 02/03/2016 1800   BILIRUBINUR SMALL (A)  02/03/2016 1800   KETONESUR 15 (A) 02/03/2016 1800   PROTEINUR 100 (A) 02/03/2016 1800   UROBILINOGEN 0.2 11/26/2015  [place] 02/03/2016 1800   LEUKOCYTESUR SMALL (A) 02/03/2016 1800             Recent Results (from the past 240 hour(s))  Culture, blood (routine x 2)     Status: None (Preliminary result)   Collection Time: 02/03/16  5:45 PM  Result Value Ref Range Status   Specimen Description   Final    BLOOD LEFT ANTECUBITAL Performed at [facility]   Special Requests NONE  Final   Culture PENDING  Incomplete   Report Status PENDING  Incomplete  Culture, blood (routine x 2)     Status: None (Preliminary result)   Collection Time: 02/03/16  6:15 PM  Result Value Ref Range Status   Specimen Description BLOOD RIGHT ARM  Final   Special Requests BOTTLES DRAWN AEROBIC AND ANAEROBIC 10CC  Final   Culture PENDING  Incomplete   Report Status PENDING  Incomplete     Radiological Exams on Admission:  Imaging Results (Last 48 hours)  Dg Chest 2 View  Result Date: 02/03/2016 CLINICAL DATA:  Fever EXAM: CHEST  2 VIEW COMPARISON:  06/15/2015 chest radiograph. FINDINGS: Stable cardiomediastinal silhouette with top-normal heart size and aortic atherosclerosis. No pneumothorax. No pleural effusion. Right rotated frontal view. Hypo inspiratory lateral view. No pulmonary edema. Bibasilar lung opacities on the lateral view are favored to represent atelectasis given the absence of opacities on the better inflated frontal view. IMPRESSION: Bibasilar atelectasis.  Otherwise no active disease in the chest. Aortic atherosclerosis. Electronically Signed   By: Ilona Sorrel M.D.   On: 02/03/2016 16:32      Assessment/Plan Principal Problem:   UTI (lower urinary tract infection) Admit to telemetry/inpatient. Continue IV hydration with normal saline at 100 mL/hr. Continue Rocephin 1 g IV piggyback every 24 hours. Antiemetics as needed. Tylenol  and ibuprofen as needed for pain/fever. The patient has side effects with abnormal behavior to opioids. Follow-up blood cultures, urine culture and sensitivity.  Active Problems:   Bipolar 1 disorder (H[facility]) Stable at this time. Continue Lexapro 10 mg by mouth daily.    DM (diabetes mellitus) type II uncontrolled with eye manifestation ([facility]) Carbohydrate modified diet. Continue metformin. Monitor renal function. CBG monitoring with regular insulin sliding scale.       Bilateral diabetic foot ulcer associated with secondary diabetes mellitus ([place])   Continue wound care.  Analgesics as needed.    Hyperlipidemia LDL goal <100 Continue atorvastatin 20 mg by mouth at bedtime. Monitor LFTs periodically.       Erythropoietin deficiency anemia Check anemia panel. Monitor hematocrit and hemoglobin. Type and screen ordered for the morning. Transfuse as needed.    Hiatal hernia Protonix 40 mg by mouth daily.  Mild bibasilar atelectasis Incentive spirometry every hour while awake.    DVT prophylaxis: Lovenox SQ. Code Status: Code per Family Communication:  Disposition Plan: Admit for IV antibiotic therapy for several days. Consults called:  Admission status: Inpatient/telemetry.   Reubin Milan MD [facility] Pager [phone].   If 7PM-7AM, please contact night-coverage [email or web address] Password [facility]  02/03/2016, 11:09 PM

## 2016-02-03 NOTE — ED Notes (Signed)
Pt going to xray  

## 2016-02-03 NOTE — ED Notes (Signed)
EDP Pickering at bedside

## 2016-02-03 NOTE — ED Triage Notes (Signed)
Patient c/o vomiting, chills, unknown if having a fever for the past few days. Patient with toe amputations to both feet, approx 1 year ago. Patient is seen at the wound center, states the wounds were scraped on Thursday and the wounds are still draining, states this was done to remove calluses. Patient states she has taken ibuprofen and "anything I could get" but nothing seems to work.

## 2016-02-03 NOTE — ED Notes (Signed)
MD Pickering at bedside.  

## 2016-02-03 NOTE — ED Provider Notes (Signed)
[facility] DEPT [facility] Provider Note   CSN: [id number] Arrival date & time: 02/03/16  1455 By signing my name below, I, Irene Pap, attest that this documentation has been prepared under the direction and in the presence of Davonna Belling, MD. Electronically Signed: Irene Pap, ED Scribe. 02/03/16. 3:39 PM.  History   Chief Complaint Chief Complaint  Patient presents with  . Emesis    chills, weakness   The history is provided by the patient. No language interpreter was used.   HPI Comments: Dana [name] is a 60 y.o. female with a hx of Type I DM, cellulitis and abscess of foot, diabetic neuropathy, erythropoietin deficiency anemia, HTN, and HLD who presents to the Emergency Department complaining of gradually worsening nausea and vomiting onset 3 days ago. Pt reports associated fever tmax 101 F and chills. Pt says that she is anemic and needs blood, but has not been able to be seen by her hematologist. She believes that this is contributing to her symptoms. Pt has toe amputations to the bilateral feet and was seen by wound care last week to have abscesses on her feet scraped. Family states that the wounds are continuing to drain. He says that typically pt will be "down" a day or two after having the wound care performed, but it has not lasted this long. He says that her wounds are looking good in comparison to past abscesses. Pt says that her CBG levels are under control. She denies cough, dysuria, frequency, or hematuria.   Past Medical History:  Diagnosis Date  . Bipolar 1 disorder ([place])   . Cellulitis and abscess of foot 03/08/2015  . Diabetes mellitus    INSULIN DEPENDENT  . Diabetic neuropathy ([place])   . Diabetic neuropathy ([place])   . 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 H/O hiatal hernia   . Hyperlipidemia   . Hypertension    past hx of  . Iron malabsorption 09/26/2015  . Numbness and tingling    Hx; of in B/LLE and B/LUE  . Other iron deficiency anemias  09/20/2015    Patient Active Problem List   Diagnosis Date Noted  . Anemia associated with diabetes mellitus ([place]) 11/07/2015  . Iron malabsorption 09/26/2015  . Other iron deficiency anemias 09/20/2015  . 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 Diabetic retinopathy associated with diabetes mellitus due to underlying condition ([place]) 03/15/2015  . Anemia 03/11/2015  . Diabetic ulcer of toe ([place]) 03/07/2015  . DM (diabetes mellitus) type II uncontrolled with eye manifestation ([place]) 11/08/2014  . Hyperlipidemia LDL goal <100 11/08/2014  . Diabetic polyneuropathy associated with type 2 diabetes mellitus ([place]) 11/08/2014  . Partial nontraumatic amputation of foot ([place]) 11/08/2014  . Bipolar 1 disorder ([place]) 10/24/2011    Past Surgical History:  Procedure Laterality Date  . ABDOMINAL HYSTERECTOMY    . ADENOIDECTOMY     Hx: of  . AMPUTATION  10/24/2011   Procedure: AMPUTATION RAY;  Surgeon: Newt Minion, MD;  Location: [facility];  Service: Orthopedics;  Laterality: Right;  Right Foot 3rd Ray Amputation  . AMPUTATION Right 12/16/2012   Procedure: Right Foot Transmetatarsal Amputation;  Surgeon: Newt Minion, MD;  Location: [facility];  Service: Orthopedics;  Laterality: Right;  . AMPUTATION Left 03/09/2015   Procedure: LEFT FOOT 1ST RAY AMPUTATION;  Surgeon: Newt Minion, MD;  Location: [facility];  Service: Orthopedics;  Laterality: Left;  . BLADDER SURGERY     x 2, tacked 1st time; mesh "eroded", had to be removed  .  Bladder Tact   2002  . BREAST SURGERY Left    "knot removed"  . CARPAL TUNNEL RELEASE Left   . COLON SURGERY    . NASAL SEPTUM SURGERY  1976  . TONSILLECTOMY     age 31's    OB History    No data available       Home Medications    Prior to Admission medications   Medication Sig Start Date End Date Taking? Authorizing Provider  aspirin 81 MG tablet Take 81 mg by mouth daily.   Yes Historical Provider, MD  atorvastatin (LIPITOR) 20 MG tablet Take 1 tablet (20 mg  total) by mouth at bedtime. 11/02/15  Yes Brunetta Jeans, PA-C  Blood Glucose Monitoring Suppl (FREESTYLE LITE) DEVI Use to check blood sugar 3 times a day dx code E11.39 05/03/15  Yes Elayne Snare, MD  escitalopram (LEXAPRO) 10 MG tablet Take 1 tablet by mouth daily. 09/07/15  Yes Historical Provider, MD  gabapentin (NEURONTIN) 600 MG tablet 600 mg. Taking 2 tabs three times a day 12/28/15  Yes Historical Provider, MD  glucose blood (FREESTYLE LITE) test strip Use as instructed to check blood sugar 3 times a day dx code E11.39 12/10/15  Yes Elayne Snare, MD  insulin regular (NOVOLIN R RELION) 100 units/mL injection Take 6 units with breakfast, 8 units with lunch and 8 units with dinner Patient taking differently: Inject 68 Units into the skin. takes 68 units with V-go per day) 04/16/15  Yes Elayne Snare, MD  INSULIN SYRINGE 1CC/29G 29G X 1/2" 1 ML MISC Use 1 per day. 12/26/15  Yes Elayne Snare, MD  Lancets (FREESTYLE) lancets Use as instructed to check blood sugar 3 times per day dx code E11.39 05/03/15  Yes Elayne Snare, MD  metFORMIN (GLUCOPHAGE) 1000 MG tablet 1 bid 01/24/16  Yes Elayne Snare, MD  OVER THE COUNTER MEDICATION Centrum-Muli. Vitamin-Take 2 gummies every day.   Yes Historical Provider, MD  Insulin Disposable Pump (V-GO 30) KIT Use one per day 03/28/15   Elayne Snare, MD  REGRANEX 0.01 % gel AS DIRECTED 07/11/15   Historical Provider, MD    Family History Family History  Problem Relation Age of Onset  . Diabetes Mother   . Mental illness Mother   . Bipolar disorder Mother   . Hypertension Father   . Diabetes Maternal Grandmother   . Heart disease Neg Hx     Social History Social History  Substance Use Topics  . Smoking status: Never Smoker  . Smokeless tobacco: Never Used  . Alcohol use No     Allergies   Ativan [lorazepam]; Darvocet [propoxyphene n-acetaminophen]; Demerol [meperidine]; Latex; Lithium; and Oxycodone   Review of Systems Review of Systems  Constitutional: Positive  for chills and fever.  Respiratory: Negative for cough.   Gastrointestinal: Positive for nausea and vomiting.  Genitourinary: Negative for dysuria, frequency and hematuria.  All other systems reviewed and are negative.  Physical Exam Updated Vital Signs BP 125/78 (BP Location: Left Arm)   Pulse 109   Temp 101 F (38.3 C) (Oral)   Resp 20   Ht '5\' 1"'  (1.549 m)   Wt 174 lb (78.9 kg)   SpO2 95%   BMI 32.88 kg/m   Physical Exam  Constitutional: She is oriented to person, place, and time. She appears well-developed and well-nourished.  Wearing a mask  HENT:  Head: Normocephalic and atraumatic.  Eyes: EOM are normal. Pupils are equal, round, and reactive to light.  Neck: Normal  range of motion. Neck supple.  Cardiovascular: Regular rhythm and normal heart sounds.  Tachycardia present.  Exam reveals no gallop and no friction rub.   No murmur heard. Mild tachycardia  Pulmonary/Chest: Effort normal and breath sounds normal. She has no wheezes.  Abdominal: Soft. There is no tenderness.  Musculoskeletal: She exhibits no edema.  Right foot: post ray amputation; 3x4 cm ulcer with no drainage and some granulation tissue Left foot: post great toe amputation; 1.5x2.5 cm ulcer to plantar surface of the foot; no drainage or erythema  Neurological: She is alert and oriented to person, place, and time.  Skin: Skin is warm and dry.  Psychiatric: She has a normal mood and affect. Her behavior is normal.  Nursing note and vitals reviewed.  ED Treatments / Results  DIAGNOSTIC STUDIES: Oxygen Saturation is 95% on RA, adequate by my interpretation.    COORDINATION OF CARE: 3:35 PM-Discussed treatment plan which includes labs with pt at bedside and pt agreed to plan.    Labs (all labs ordered are listed, but only abnormal results are displayed) Labs Reviewed - No data to display  EKG  EKG Interpretation None       Radiology No results found.  Procedures Procedures (including  critical care time)  Medications Ordered in ED Medications  acetaminophen (TYLENOL) tablet 650 mg (not administered)     Initial Impression / Assessment and Plan / ED Course  I have reviewed the triage vital signs and the nursing notes.  Pertinent labs & imaging results that were available during my care of the patient were reviewed by me and considered in my medical decision making (see chart for details).  Clinical Course    Patient presents with nausea vomiting fever and chills. Possible UTI on lab work. Feet have chronic ulcers but did not look overtly infected this time. Also has anemia. Acute on chronic. Scheduled for infusion tomorrow. X-ray reassuring mild hypoxia with sats of 90. Will admit to internal medicine. Discussed Dr. Hal Hope.    Final Clinical Impressions(s) / ED Diagnoses   Final diagnoses:  None  I personally performed the services described in this documentation, which was scribed in my presence. The recorded information has been reviewed and is accurate.     New Prescriptions New Prescriptions   No medications on file     Davonna Belling, MD 02/03/16 1947

## 2016-02-03 NOTE — ED Notes (Signed)
Pt alert, NAD, calm, interactive, no dyspnea noted, (denies: pain, sob, nausea or dizziness), temp improved. abx infusing.

## 2016-02-03 NOTE — Progress Notes (Signed)
New Admission Note:   Arrival Method: [place] to room [phone] from [facility] Orientation: AOX4 Telemetry: Box 41-19 Assessment: Completed Skin: Intact IV:L AC, R FA Pain:denies pain  Tubes: None Safety Measures: Safety Fall Prevention Plan has been discussed  Admission:  [facility] [address] Orientation: Patient has been orientated to the room, unit and staff.  Family: none present MD has been notified of arrival Orders to be reviewed and implemented. Will continue to monitor the patient. Call light has been placed within reach and bed alarm has been activated.    Isac Caddy, RN

## 2016-02-04 ENCOUNTER — Other Ambulatory Visit: Payer: PPO

## 2016-02-04 ENCOUNTER — Ambulatory Visit: Payer: PPO | Admitting: Family

## 2016-02-04 ENCOUNTER — Encounter (HOSPITAL_COMMUNITY): Payer: Self-pay

## 2016-02-04 ENCOUNTER — Ambulatory Visit: Payer: PPO

## 2016-02-04 DIAGNOSIS — E1165 Type 2 diabetes mellitus with hyperglycemia: Secondary | ICD-10-CM

## 2016-02-04 DIAGNOSIS — E11311 Type 2 diabetes mellitus with unspecified diabetic retinopathy with macular edema: Secondary | ICD-10-CM

## 2016-02-04 DIAGNOSIS — N179 Acute kidney failure, unspecified: Secondary | ICD-10-CM | POA: Diagnosis not present

## 2016-02-04 DIAGNOSIS — N390 Urinary tract infection, site not specified: Secondary | ICD-10-CM | POA: Diagnosis not present

## 2016-02-04 DIAGNOSIS — L97529 Non-pressure chronic ulcer of other part of left foot with unspecified severity: Secondary | ICD-10-CM | POA: Diagnosis not present

## 2016-02-04 DIAGNOSIS — L97519 Non-pressure chronic ulcer of other part of right foot with unspecified severity: Secondary | ICD-10-CM

## 2016-02-04 DIAGNOSIS — J9811 Atelectasis: Secondary | ICD-10-CM | POA: Diagnosis present

## 2016-02-04 DIAGNOSIS — E08621 Diabetes mellitus due to underlying condition with foot ulcer: Secondary | ICD-10-CM | POA: Diagnosis not present

## 2016-02-04 DIAGNOSIS — R7881 Bacteremia: Secondary | ICD-10-CM | POA: Diagnosis not present

## 2016-02-04 LAB — RETICULOCYTES
RBC.: 1.95 MIL/uL — AB (ref 3.87–5.11)
RETIC CT PCT: 1.3 % (ref 0.4–3.1)
Retic Count, Absolute: 25.4 10*3/uL (ref 19.0–186.0)

## 2016-02-04 LAB — BLOOD CULTURE ID PANEL (REFLEXED)
Acinetobacter baumannii: NOT DETECTED
CANDIDA GLABRATA: NOT DETECTED
CANDIDA KRUSEI: NOT DETECTED
CANDIDA TROPICALIS: NOT DETECTED
Candida albicans: NOT DETECTED
Candida parapsilosis: NOT DETECTED
ENTEROBACTER CLOACAE COMPLEX: NOT DETECTED
Enterobacteriaceae species: NOT DETECTED
Enterococcus species: NOT DETECTED
Escherichia coli: NOT DETECTED
Haemophilus influenzae: NOT DETECTED
KLEBSIELLA PNEUMONIAE: NOT DETECTED
Klebsiella oxytoca: NOT DETECTED
Listeria monocytogenes: NOT DETECTED
Methicillin resistance: NOT DETECTED
NEISSERIA MENINGITIDIS: NOT DETECTED
PROTEUS SPECIES: NOT DETECTED
Pseudomonas aeruginosa: NOT DETECTED
SERRATIA MARCESCENS: NOT DETECTED
STAPHYLOCOCCUS AUREUS BCID: DETECTED — AB
STAPHYLOCOCCUS SPECIES: DETECTED — AB
STREPTOCOCCUS SPECIES: NOT DETECTED
Streptococcus agalactiae: NOT DETECTED
Streptococcus pneumoniae: NOT DETECTED
Streptococcus pyogenes: NOT DETECTED

## 2016-02-04 LAB — RESPIRATORY PANEL BY PCR
Adenovirus: NOT DETECTED
BORDETELLA PERTUSSIS-RVPCR: NOT DETECTED
CHLAMYDOPHILA PNEUMONIAE-RVPPCR: NOT DETECTED
CORONAVIRUS HKU1-RVPPCR: NOT DETECTED
Coronavirus 229E: NOT DETECTED
Coronavirus NL63: NOT DETECTED
Coronavirus OC43: NOT DETECTED
INFLUENZA A-RVPPCR: NOT DETECTED
Influenza B: NOT DETECTED
METAPNEUMOVIRUS-RVPPCR: NOT DETECTED
Mycoplasma pneumoniae: NOT DETECTED
PARAINFLUENZA VIRUS 2-RVPPCR: NOT DETECTED
PARAINFLUENZA VIRUS 3-RVPPCR: NOT DETECTED
PARAINFLUENZA VIRUS 4-RVPPCR: NOT DETECTED
Parainfluenza Virus 1: NOT DETECTED
RESPIRATORY SYNCYTIAL VIRUS-RVPPCR: NOT DETECTED
RHINOVIRUS / ENTEROVIRUS - RVPPCR: NOT DETECTED

## 2016-02-04 LAB — GLUCOSE, CAPILLARY
GLUCOSE-CAPILLARY: 187 mg/dL — AB (ref 65–99)
GLUCOSE-CAPILLARY: 161 mg/dL — AB (ref 65–99)
GLUCOSE-CAPILLARY: 165 mg/dL — AB (ref 65–99)
Glucose-Capillary: 261 mg/dL — ABNORMAL HIGH (ref 65–99)

## 2016-02-04 LAB — IRON AND TIBC
Iron: 17 ug/dL — ABNORMAL LOW (ref 28–170)
SATURATION RATIOS: 13 % (ref 10.4–31.8)
TIBC: 132 ug/dL — AB (ref 250–450)
UIBC: 115 ug/dL

## 2016-02-04 LAB — ABO/RH: ABO/RH(D): B POS

## 2016-02-04 LAB — FOLATE: Folate: 22.1 ng/mL (ref >5.9)

## 2016-02-04 LAB — VITAMIN B12: Vitamin B-12: 1066 pg/mL — ABNORMAL HIGH (ref 180–914)

## 2016-02-04 LAB — FERRITIN: FERRITIN: 1105 ng/mL — AB (ref 11–307)

## 2016-02-04 MED ORDER — CEFAZOLIN IN D5W 1 GM/50ML IV SOLN
1.0000 g | Freq: Three times a day (TID) | INTRAVENOUS | Status: DC
  Administered 2016-02-04 – 2016-02-06 (×6): 1 g via INTRAVENOUS
  Filled 2016-02-04 (×11): qty 50

## 2016-02-04 MED ORDER — INSULIN GLARGINE 100 UNIT/ML ~~LOC~~ SOLN
30.0000 [IU] | Freq: Every day | SUBCUTANEOUS | Status: DC
  Administered 2016-02-04 – 2016-02-07 (×4): 30 [IU] via SUBCUTANEOUS
  Filled 2016-02-04 (×5): qty 0.3

## 2016-02-04 MED ORDER — CHLORHEXIDINE GLUCONATE CLOTH 2 % EX PADS
6.0000 | MEDICATED_PAD | Freq: Every day | CUTANEOUS | Status: DC
  Administered 2016-02-05 – 2016-02-07 (×2): 6 via TOPICAL

## 2016-02-04 MED ORDER — METFORMIN HCL 500 MG PO TABS: 500.0000 mg | ORAL_TABLET | Freq: Every evening | ORAL | Status: DC

## 2016-02-04 MED ORDER — ALUM & MAG HYDROXIDE-SIMETH 200-200-20 MG/5ML PO SUSP
30.0000 mL | ORAL | Status: DC | PRN
  Administered 2016-02-04: 30 mL via ORAL
  Filled 2016-02-04: qty 30

## 2016-02-04 MED ORDER — INFLUENZA VAC SPLIT QUAD 0.5 ML IM SUSY
0.5000 mL | PREFILLED_SYRINGE | INTRAMUSCULAR | Status: AC
  Administered 2016-02-05: 0.5 mL via INTRAMUSCULAR
  Filled 2016-02-04: qty 0.5

## 2016-02-04 MED ORDER — MUPIROCIN 2 % EX OINT
1.0000 "application " | TOPICAL_OINTMENT | Freq: Two times a day (BID) | CUTANEOUS | Status: DC
  Administered 2016-02-04 – 2016-02-08 (×8): 1 via NASAL
  Filled 2016-02-04 (×2): qty 22

## 2016-02-04 NOTE — Consult Note (Signed)
[facility] Nurse wound consult note Reason for Consult: Bilateral foot ulcers Wound type: Bilateral plantar diabetic ulcers, right  charcot foot Pressure Ulcer POA: Na Measurement:Right 5 x 4 x 0.3 cm Wound bed: 100% yellow/tan slough Drainage (amount, consistency, odor) copious, dripping, thin, tan, malodorous drainage Periwound: Thick callous, pale yellow skin Dressing procedure/placement/frequency: Cleanse daily with NS.  Apply silver alginate (Aquacel AG+ hydrofiber) cut to fit wound base.  Cover with foam dressing and secure with kerlix.  Change daily.  Measurement: Left 3 x 2 x 0.4 cm Wound bed: 100% yellow slough Drainage (amount, consistency, odor) moderate , thin, tan, malodorous drainage Periwound: Thick callous, pale yellow skin Dressing procedure/placement/frequency: Cleanse daily with NS.  Apply silver alginate (Aquacel AG+ hydrofiber) cut to fit wound base.  Cover with foam dressing and secure with kerlix.  Change daily.  Discussed POC with patient and bedside nurse.  If pt still in hospital Wednesday, will follow up. Pt will need to return to wound care center at discharge. Thank you, Dorna Bloom BSN, RN, [facility]

## 2016-02-04 NOTE — Progress Notes (Addendum)
Inpatient Diabetes Program Recommendations  AACE/ADA: New Consensus Statement on Inpatient Glycemic Control (2015)  Target Ranges:  Prepandial:   less than 140 mg/dL      Peak postprandial:   less than 180 mg/dL (1-2 hours)      Critically ill patients:  140 - 180 mg/dL   Lab Results  Component Value Date   GLUCAP 261 (H) 02/04/2016   HGBA1C 7.8 (H) 01/18/2016    Review of Glycemic Control  Diabetes history: DM 2 (Sees Dr. Dwyane Dee) Outpatient Diabetes medications: Metformin 1000 mg QAM, 500 mg QPM, V-Go 30 insulin pump (30 units of basal, 6 units breakfast, 8 units lunch, 8 units dinner) Current orders for Inpatient glycemic control: Novolog Moderate TID  A1c 7.8% on 01/18/16  Inpatient Diabetes Program Recommendations:   Please consider either restarting V-Go insulin pump or consider starting low dose basal insulin Lantus 20-25 units Daily while inpatient.  11:30 am. Patient without V-Go and supplies at this time. Spoke with Dr. Clementeen Graham, new orders written.  Thanks,  Tama Headings RN, MSN, [facility] Inpatient Diabetes Coordinator Team Pager [phone] (8a-5p)

## 2016-02-04 NOTE — Progress Notes (Signed)
PHARMACY - PHYSICIAN COMMUNICATION CRITICAL VALUE ALERT - BLOOD CULTURE IDENTIFICATION (BCID)  Results for orders placed or performed during the hospital encounter of 02/03/16  Blood Culture ID Panel (Reflexed) (Collected: 02/03/2016  5:45 PM)  Result Value Ref Range   Enterococcus species NOT DETECTED NOT DETECTED   Listeria monocytogenes NOT DETECTED NOT DETECTED   Staphylococcus species DETECTED (A) NOT DETECTED   Staphylococcus aureus DETECTED (A) NOT DETECTED   Methicillin resistance NOT DETECTED NOT DETECTED   Streptococcus species NOT DETECTED NOT DETECTED   Streptococcus agalactiae NOT DETECTED NOT DETECTED   Streptococcus pneumoniae NOT DETECTED NOT DETECTED   Streptococcus pyogenes NOT DETECTED NOT DETECTED   Acinetobacter baumannii NOT DETECTED NOT DETECTED   Enterobacteriaceae species NOT DETECTED NOT DETECTED   Enterobacter cloacae complex NOT DETECTED NOT DETECTED   Escherichia coli NOT DETECTED NOT DETECTED   Klebsiella oxytoca NOT DETECTED NOT DETECTED   Klebsiella pneumoniae NOT DETECTED NOT DETECTED   Proteus species NOT DETECTED NOT DETECTED   Serratia marcescens NOT DETECTED NOT DETECTED   Haemophilus influenzae NOT DETECTED NOT DETECTED   Neisseria meningitidis NOT DETECTED NOT DETECTED   Pseudomonas aeruginosa NOT DETECTED NOT DETECTED   Candida albicans NOT DETECTED NOT DETECTED   Candida glabrata NOT DETECTED NOT DETECTED   Candida krusei NOT DETECTED NOT DETECTED   Candida parapsilosis NOT DETECTED NOT DETECTED   Candida tropicalis NOT DETECTED NOT DETECTED    Name of physician (or Provider) Contacted: Dr. Clementeen Graham  Changes to prescribed antibiotics required: Recommended changing rocephin to ancef  Manley Mason   02/04/2016  4:58 PM

## 2016-02-04 NOTE — Progress Notes (Signed)
PROGRESS NOTE                                                                                                                                                                                                             Patient Demographics:    Dana [name], is a 60 y.o. female, DOB - 06/04/1955, NLG:[id number]  Admit date - 02/03/2016   Admitting Physician Rise Patience, MD  Outpatient Primary MD for the patient is [name], PA-C  LOS - 1  Outpatient Specialists:  Chief Complaint  Patient presents with  . Emesis    chills, weakness       Brief Narrative  60 year old female wit history of insulin-dependent diabetes mellitus (on insulin pump), diabetic neuropathy, chronic diabetic foot ulcer status post recent debridement, iron deficiency anemia with malabsorption (receives when necessary iron infusion as outpatient), multiple UTIs, bipolar disorder who presented to [facility] with nausea and vomiting associated with fatigue and dysuria. She had 2 episode of vomiting on the day prior to admission associated with chills and fever of 101F. Patient was found to have UTI with mild leukocytosis and acute kidney injury. admitted to hospitalist service.   Subjective:    Patient informs feeling much better today.   Assessment  & Plan :    Principal Problem:   SIRS secondary to UTI (lower urinary tract infection) Place on empiric Rocephin. Unfortunately urine culture not sent on admission. SIRS resolved. Respiratory viral panel sent. Continue IV hydration, supportive care with, and antiemetics.  Active Problems:   Bipolar 1 disorder ([facility])   DM (diabetes mellitus) type II uncontrolled with eye manifestation ([facility]) On metformin and insulin pump. (30 units basal, 6 units breakfast and 8 units each with lunch and dinner) CBG in 170-260. Have resumed basal insulin, continue sliding scale coverage.    Bilateral diabetic foot ulcer associated with secondary diabetes mellitus ([place]) Recent debridement as outpatient. Has bilateral foot ulcers.    Diabetic ulcer of toe ([place])  Wound care consulted  Iron deficiency anemia Received IV iron as outpatient. Hemoglobin of 7.1. Given her acute infection hold off on IV iron for now and can be given as outpatient (patient is due for transfusion this week)  Acute kidney injury Secondary to dehydration. Mild. Continue IV hydration  Dyslipidemia Continue statin  Bipolar disorder with depression  Continue Lexapro    Code Status : Full code  Family Communication  : None at bedside  Disposition Plan  : Home once clinically improved possibly in the next 48 hours  Barriers For Discharge : Active symptoms  Consults  : None  Procedures  : None   DVT Prophylaxis  :  Lovenox -   Lab Results  Component Value Date   PLT 503 (H) 02/03/2016    Antibiotics  :    Anti-infectives    Start     Dose/Rate Route Frequency Ordered Stop   02/04/16 2000  cefTRIAXone (ROCEPHIN) 1 g in dextrose 5 % 50 mL IVPB     1 g 100 mL/hr over 30 Minutes Intravenous Every 24 hours 02/03/16 2318     02/03/16 1930  cefTRIAXone (ROCEPHIN) 1 g in dextrose 5 % 50 mL IVPB     1 g 100 mL/hr over 30 Minutes Intravenous  Once 02/03/16 1923 02/03/16 2030        Objective:   Vitals:   02/03/16 2015 02/03/16 2132 02/04/16 0422 02/04/16 0930  BP: 103/55 107/73 (!) 133/58 (!) 113/42  Pulse: 82 82 99 85  Resp: 15 18 18 18   Temp:  99.2 F (37.3 C) (!) 101 F (38.3 C) 99.3 F (37.4 C)  TempSrc:  Oral Oral Oral  SpO2: 98% 100% 99% 98%  Weight:  78.7 kg (173 lb 9.6 oz)    Height:  5\' 1"  (1.549 m)      Wt Readings from Last 3 Encounters:  02/03/16 78.7 kg (173 lb 9.6 oz)  01/24/16 79 kg (174 lb 3.2 oz)  01/03/16 79 kg (174 lb 3.2 oz)     Intake/Output Summary (Last 24 hours) at 02/04/16 1239 Last data filed at 02/04/16 0422  Gross per 24 hour  Intake            856.67 ml  Output              250 ml  Net           606.67 ml     Physical Exam  Gen: not in distress HEENT: no pallor, moist mucosa, supple neck Chest: clear b/l, no added sounds CVS: N S1&S2, no murmurs, rubs or gallop GI: soft, NT, ND, BS+ Musculoskeletal: warm, no edema, Bilateral foot ulcers, left foot amputation CNS: Alert and oriented    Data Review:    CBC  Recent Labs Lab 02/03/16 1635  WBC 12.5*  HGB 7.1*  HCT 21.7*  PLT 503*  MCV 89.3  MCH 29.2  MCHC 32.7  RDW 13.6  LYMPHSABS 0.8  MONOABS 0.8  EOSABS 0.0  BASOSABS 0.0    Chemistries   Recent Labs Lab 02/03/16 1635  NA 132*  K 4.4  CL 100*  CO2 21*  GLUCOSE 257*  BUN 45*  CREATININE 1.32*  CALCIUM 8.9  AST 17  ALT 21  ALKPHOS 207*  BILITOT 1.0   ------------------------------------------------------------------------------------------------------------------ No results for input(s): CHOL, HDL, LDLCALC, TRIG, CHOLHDL, LDLDIRECT in the last 72 hours.  Lab Results  Component Value Date   HGBA1C 7.8 (H) 01/18/2016   ------------------------------------------------------------------------------------------------------------------ No results for input(s): TSH, T4TOTAL, T3FREE, THYROIDAB in the last 72 hours.  Invalid input(s): FREET3 ------------------------------------------------------------------------------------------------------------------  Recent Labs  02/04/16 0717  VITAMINB12 1,066*  FOLATE 22.1  FERRITIN 1,105*  TIBC 132*  IRON 17*  RETICCTPCT 1.3    Coagulation profile No results for input(s): INR, PROTIME in the last 168 hours.  No results  for input(s): DDIMER in the last 72 hours.  Cardiac Enzymes No results for input(s): CKMB, TROPONINI, MYOGLOBIN in the last 168 hours.  Invalid input(s): CK ------------------------------------------------------------------------------------------------------------------ No results found for: BNP  Inpatient  Medications  Scheduled Meds: . aspirin  81 mg Oral Daily  . atorvastatin  20 mg Oral QHS  . cefTRIAXone (ROCEPHIN)  IV  1 g Intravenous Q24H  . escitalopram  10 mg Oral Daily  . gabapentin  1,200 mg Oral TID  . [START ON 02/05/2016] Influenza vac split quadrivalent PF  0.5 mL Intramuscular Tomorrow-1000  . insulin aspart  0-15 Units Subcutaneous TID WC  . insulin glargine  30 Units Subcutaneous QHS  . pantoprazole  40 mg Oral Daily   Continuous Infusions: . sodium chloride 100 mL/hr at 02/04/16 1109   PRN Meds:.alum & mag hydroxide-simeth, ondansetron  Micro Results Recent Results (from the past 240 hour(s))  Culture, blood (routine x 2)     Status: None (Preliminary result)   Collection Time: 02/03/16  5:45 PM  Result Value Ref Range Status   Specimen Description BLOOD LEFT ANTECUBITAL  Final   Special Requests NONE  Final   Culture  Setup Time   Final    GRAM POSITIVE COCCI IN CLUSTERS AEROBIC BOTTLE ONLY Organism ID to follow Performed at [facility]    Culture PENDING  Incomplete   Report Status PENDING  Incomplete  Culture, blood (routine x 2)     Status: None (Preliminary result)   Collection Time: 02/03/16  6:15 PM  Result Value Ref Range Status   Specimen Description BLOOD RIGHT ARM  Final   Special Requests BOTTLES DRAWN AEROBIC AND ANAEROBIC 10CC  Final   Culture PENDING  Incomplete   Report Status PENDING  Incomplete    Radiology Reports Dg Chest 2 View  Result Date: 02/03/2016 CLINICAL DATA:  Fever EXAM: CHEST  2 VIEW COMPARISON:  06/15/2015 chest radiograph. FINDINGS: Stable cardiomediastinal silhouette with top-normal heart size and aortic atherosclerosis. No pneumothorax. No pleural effusion. Right rotated frontal view. Hypo inspiratory lateral view. No pulmonary edema. Bibasilar lung opacities on the lateral view are favored to represent atelectasis given the absence of opacities on the better inflated frontal view. IMPRESSION: Bibasilar atelectasis.   Otherwise no active disease in the chest. Aortic atherosclerosis. Electronically Signed   By: Ilona Sorrel M.D.   On: 02/03/2016 16:32    Time Spent in minutes  25   Louellen Molder M.D on 02/04/2016 at 12:39 PM  Between 7am to 7pm - Pager - [phone]  After 7pm go to [email or web address] - password [facility]  [facility] -  Office  [phone]

## 2016-02-05 ENCOUNTER — Ambulatory Visit: Payer: PPO | Admitting: Physician Assistant

## 2016-02-05 ENCOUNTER — Ambulatory Visit: Payer: PPO | Admitting: Physical Therapy

## 2016-02-05 ENCOUNTER — Ambulatory Visit: Payer: PPO

## 2016-02-05 ENCOUNTER — Ambulatory Visit: Payer: PPO | Admitting: Hematology & Oncology

## 2016-02-05 ENCOUNTER — Other Ambulatory Visit: Payer: PPO

## 2016-02-05 DIAGNOSIS — E11311 Type 2 diabetes mellitus with unspecified diabetic retinopathy with macular edema: Secondary | ICD-10-CM | POA: Diagnosis not present

## 2016-02-05 DIAGNOSIS — E08621 Diabetes mellitus due to underlying condition with foot ulcer: Secondary | ICD-10-CM | POA: Diagnosis not present

## 2016-02-05 DIAGNOSIS — N179 Acute kidney failure, unspecified: Secondary | ICD-10-CM | POA: Diagnosis not present

## 2016-02-05 DIAGNOSIS — R7881 Bacteremia: Secondary | ICD-10-CM | POA: Diagnosis not present

## 2016-02-05 DIAGNOSIS — Z888 Allergy status to other drugs, medicaments and biological substances status: Secondary | ICD-10-CM

## 2016-02-05 DIAGNOSIS — Z885 Allergy status to narcotic agent status: Secondary | ICD-10-CM

## 2016-02-05 DIAGNOSIS — B9561 Methicillin susceptible Staphylococcus aureus infection as the cause of diseases classified elsewhere: Secondary | ICD-10-CM

## 2016-02-05 DIAGNOSIS — Z91040 Latex allergy status: Secondary | ICD-10-CM

## 2016-02-05 DIAGNOSIS — L97529 Non-pressure chronic ulcer of other part of left foot with unspecified severity: Secondary | ICD-10-CM | POA: Diagnosis not present

## 2016-02-05 DIAGNOSIS — M86171 Other acute osteomyelitis, right ankle and foot: Secondary | ICD-10-CM | POA: Diagnosis not present

## 2016-02-05 DIAGNOSIS — Z79899 Other long term (current) drug therapy: Secondary | ICD-10-CM

## 2016-02-05 DIAGNOSIS — Z9889 Other specified postprocedural states: Secondary | ICD-10-CM

## 2016-02-05 DIAGNOSIS — A4101 Sepsis due to Methicillin susceptible Staphylococcus aureus: Principal | ICD-10-CM

## 2016-02-05 DIAGNOSIS — E11621 Type 2 diabetes mellitus with foot ulcer: Secondary | ICD-10-CM

## 2016-02-05 DIAGNOSIS — L97519 Non-pressure chronic ulcer of other part of right foot with unspecified severity: Secondary | ICD-10-CM | POA: Diagnosis not present

## 2016-02-05 DIAGNOSIS — Z794 Long term (current) use of insulin: Secondary | ICD-10-CM

## 2016-02-05 DIAGNOSIS — N390 Urinary tract infection, site not specified: Secondary | ICD-10-CM | POA: Diagnosis not present

## 2016-02-05 DIAGNOSIS — Z89412 Acquired absence of left great toe: Secondary | ICD-10-CM

## 2016-02-05 DIAGNOSIS — Z7982 Long term (current) use of aspirin: Secondary | ICD-10-CM

## 2016-02-05 DIAGNOSIS — A419 Sepsis, unspecified organism: Secondary | ICD-10-CM | POA: Diagnosis not present

## 2016-02-05 DIAGNOSIS — Z89421 Acquired absence of other right toe(s): Secondary | ICD-10-CM

## 2016-02-05 DIAGNOSIS — L02611 Cutaneous abscess of right foot: Secondary | ICD-10-CM | POA: Diagnosis not present

## 2016-02-05 LAB — CBC WITH DIFFERENTIAL/PLATELET
Basophils Absolute: 0 10*3/uL (ref 0.0–0.1)
Basophils Relative: 0 %
EOS ABS: 0.2 10*3/uL (ref 0.0–0.7)
Eosinophils Relative: 3 %
HEMATOCRIT: 17.5 % — AB (ref 36.0–46.0)
HEMOGLOBIN: 5.6 g/dL — AB (ref 12.0–15.0)
Lymphocytes Relative: 21 %
Lymphs Abs: 2 10*3/uL (ref 0.7–4.0)
MCH: 28.9 pg (ref 26.0–34.0)
MCHC: 32 g/dL (ref 30.0–36.0)
MCV: 90.2 fL (ref 78.0–100.0)
Monocytes Absolute: 1 10*3/uL (ref 0.1–1.0)
Monocytes Relative: 10 %
NEUTROS ABS: 6.5 10*3/uL (ref 1.7–7.7)
NEUTROS PCT: 66 %
Platelets: 399 10*3/uL (ref 150–400)
RBC: 1.94 MIL/uL — AB (ref 3.87–5.11)
RDW: 14.9 % (ref 11.5–15.5)
WBC: 9.8 10*3/uL (ref 4.0–10.5)

## 2016-02-05 LAB — BASIC METABOLIC PANEL
Anion gap: 7 (ref 5–15)
BUN: 23 mg/dL — AB (ref 6–20)
CHLORIDE: 105 mmol/L (ref 101–111)
CO2: 23 mmol/L (ref 22–32)
CREATININE: 1.02 mg/dL — AB (ref 0.44–1.00)
Calcium: 8.2 mg/dL — ABNORMAL LOW (ref 8.9–10.3)
GFR calc non Af Amer: 59 mL/min — ABNORMAL LOW (ref >60)
Glucose, Bld: 144 mg/dL — ABNORMAL HIGH (ref 65–99)
POTASSIUM: 4.4 mmol/L (ref 3.5–5.1)
SODIUM: 135 mmol/L (ref 135–145)

## 2016-02-05 LAB — GLUCOSE, CAPILLARY
GLUCOSE-CAPILLARY: 137 mg/dL — AB (ref 65–99)
GLUCOSE-CAPILLARY: 132 mg/dL — AB (ref 65–99)
GLUCOSE-CAPILLARY: 155 mg/dL — AB (ref 65–99)
Glucose-Capillary: 133 mg/dL — ABNORMAL HIGH (ref 65–99)

## 2016-02-05 LAB — HEMOGLOBIN AND HEMATOCRIT, BLOOD
HCT: 19.6 % — ABNORMAL LOW (ref 36.0–46.0)
HEMOGLOBIN: 6.4 g/dL — AB (ref 12.0–15.0)

## 2016-02-05 LAB — PREPARE RBC (CROSSMATCH)

## 2016-02-05 MED ORDER — ACETAMINOPHEN 325 MG PO TABS
650.0000 mg | ORAL_TABLET | Freq: Four times a day (QID) | ORAL | Status: DC | PRN
  Administered 2016-02-05: 650 mg via ORAL
  Filled 2016-02-05: qty 2

## 2016-02-05 MED ORDER — SODIUM CHLORIDE 0.9 % IV SOLN
Freq: Once | INTRAVENOUS | Status: AC
  Administered 2016-02-05: 09:00:00 via INTRAVENOUS

## 2016-02-05 MED ORDER — SODIUM CHLORIDE 0.9 % IV SOLN
Freq: Once | INTRAVENOUS | Status: AC
  Administered 2016-02-05: 21:00:00 via INTRAVENOUS

## 2016-02-05 MED ORDER — CYCLOBENZAPRINE HCL 5 MG PO TABS
5.0000 mg | ORAL_TABLET | Freq: Four times a day (QID) | ORAL | Status: AC | PRN
  Administered 2016-02-05 – 2016-02-06 (×2): 5 mg via ORAL
  Filled 2016-02-05 (×2): qty 1

## 2016-02-05 NOTE — Progress Notes (Signed)
CRITICAL VALUE ALERT  Critical value received:  hbg 5.6  Date of notification:  02/05/16  Time of notification:  7:36 AM  Critical value read back:Yes.    Nurse who received alert:  Retta Mac  MD notified (1st page):  Dr. Clementeen Graham  Time of first page:  [phone]  MD notified (2nd page):  Time of second page:  Responding MD:  Dr. Clementeen Graham  Time MD responded:  [phone]

## 2016-02-05 NOTE — Consult Note (Signed)
[facility] Infectious Disease  Date of Admission:  02/03/2016  Date of Consult:  02/05/2016  Reason for Consult: MSSA bacteremia Referring Physician: CHAMP  Impression/Recommendation SIRS MSSA bacteremia Chronic Right diabetic foot ucler DM2 with complications AKI  Continue cefazolin , await sensititives  Will need TTE to rule out vegetation Would check MRI of both her feet Would have ortho see her Check HIV per CDC guidelines.   Thank you so much for this consult,  (pager) [phone] [email or web address]  HPI:  Dana [name] is an 60 y.o. female with a PMH of IDDM (on insulin pump) last A1C 7.8% (01-18-16), chronic bilateral diabetic foot ulcers (s/p recent debridement), diabetic neuropathy, HTN, recurrent UTI, bipolar disorder, and IDA (receives iron infusion PRN) admitted on 9/24 with fever, chills, nausea, vomiting, and fatigue that started on 9/22.  Reports fever of 101 at home and foul-smelling urine typical with her UTIs. Denied any shortness of breath, chest pain, sore throat, flank pain, hematuria, or urgency.  The patient underwent curettage of her right foot ulcer on 9/21.  Ulcers continued to drain.  She reports they look better than previously.    Initial workup showed urine with small leukocytes, WBC 12.5, Hgb 7.1, sCr 1.32, glucose 257, albumin 2.3g/dL, CXR showed bibasilar atelectasis, respiratory panel negative.  Rocephin started and then changed to ancef on 9/25.  Today she has been afebrile, WBC 9.5, Hbg 5.6.  Blood cultures positive for MSSA.  ID to consult.     Past Medical History:  Diagnosis Date  . Bipolar 1 disorder ([place])   . Cellulitis and abscess of foot 03/08/2015  . Diabetes mellitus    INSULIN DEPENDENT  . Diabetic neuropathy ([place])   . Diabetic neuropathy ([place])   . 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 H/O hiatal hernia   . Hyperlipidemia   . Hypertension    past hx of  . Iron malabsorption 09/26/2015  . Numbness and  tingling    Hx; of in B/LLE and B/LUE  . Other iron deficiency anemias 09/20/2015    Past Surgical History:  Procedure Laterality Date  . ABDOMINAL HYSTERECTOMY    . ADENOIDECTOMY     Hx: of  . AMPUTATION  10/24/2011   Procedure: AMPUTATION RAY;  Surgeon: Newt Minion, MD;  Location: [facility];  Service: Orthopedics;  Laterality: Right;  Right Foot 3rd Ray Amputation  . AMPUTATION Right 12/16/2012   Procedure: Right Foot Transmetatarsal Amputation;  Surgeon: Newt Minion, MD;  Location: [facility];  Service: Orthopedics;  Laterality: Right;  . AMPUTATION Left 03/09/2015   Procedure: LEFT FOOT 1ST RAY AMPUTATION;  Surgeon: Newt Minion, MD;  Location: [facility];  Service: Orthopedics;  Laterality: Left;  . BLADDER SURGERY     x 2, tacked 1st time; mesh "eroded", had to be removed  . Bladder Tact   2002  . BREAST SURGERY Left    "knot removed"  . CARPAL TUNNEL RELEASE Left   . COLON SURGERY    . NASAL SEPTUM SURGERY  1976  . TONSILLECTOMY     age 89's     Allergies  Allergen Reactions  . Lithium Nausea And Vomiting and Other (See Comments)    Can not keep this medication down. It makes her terribly ill.  . Ativan [Lorazepam] Other (See Comments)    Abnormal behavior  . Demerol [Meperidine] Other (See Comments)    Abnormal behavior  . Oxycodone Other (See Comments)    Abnormal behavior  .  Darvocet [Propoxyphene N-Acetaminophen] Itching  . Latex Itching    Medications:  Scheduled: . aspirin  81 mg Oral Daily  . atorvastatin  20 mg Oral QHS  .  ceFAZolin (ANCEF) IV  1 g Intravenous Q8H  . Chlorhexidine Gluconate Cloth  6 each Topical Daily  . escitalopram  10 mg Oral Daily  . gabapentin  1,200 mg Oral TID  . insulin aspart  0-15 Units Subcutaneous TID WC  . insulin glargine  30 Units Subcutaneous QHS  . mupirocin ointment  1 application Nasal BID  . pantoprazole  40 mg Oral Daily    Abtx:  Anti-infectives    Start     Dose/Rate Route Frequency Ordered Stop   02/04/16 2000   cefTRIAXone (ROCEPHIN) 1 g in dextrose 5 % 50 mL IVPB  Status:  Discontinued     1 g 100 mL/hr over 30 Minutes Intravenous Every 24 hours 02/03/16 2318 02/04/16 1708   02/04/16 1730  ceFAZolin (ANCEF) IVPB 1 g/50 mL premix     1 g 100 mL/hr over 30 Minutes Intravenous Every 8 hours 02/04/16 1708     02/03/16 1930  cefTRIAXone (ROCEPHIN) 1 g in dextrose 5 % 50 mL IVPB     1 g 100 mL/hr over 30 Minutes Intravenous  Once 02/03/16 1923 02/03/16 2030      Total days of antibiotics: 2          Social History:  reports that she has never smoked. She has never used smokeless tobacco. She reports that she does not drink alcohol or use drugs.  Family History  Problem Relation Age of Onset  . Diabetes Mother   . Mental illness Mother   . Bipolar disorder Mother   . Hypertension Father   . Diabetes Maternal Grandmother   . Heart disease Neg Hx     General ROS: see HPI, 12 point ROS o/w negative. R foot ulcer since 1 and half years, L foot since 04-2015.   Blood pressure 119/63, pulse 75, temperature 98.8 F (37.1 C), temperature source Oral, resp. rate 18, height '5\' 1"'  (1.549 m), weight 78.7 kg (173 lb 9.6 oz), SpO2 100 %. General appearance: alert, cooperative and no distress Eyes: negative findings: conjunctivae and sclerae normal and pupils equal, round, reactive to light and accomodation Throat: normal findings: oropharynx pink & moist without lesions or evidence of thrush and abnormal findings: dentition: poor Neck: no adenopathy and supple, symmetrical, trachea midline Lungs: clear to auscultation bilaterally Heart: regular rate and rhythm Abdomen: normal findings: bowel sounds normal and soft, non-tender Extremities: edema none and R stump hot. there is a large ulcer on the sole of her R stump/foot. there is gross d/c easily expressable. there is a 1" ulcer on the sole of her L foot. no gross d/c. she has 2+ pulse on her L DP.  Neurologic: Sensory: decreased light touch BLE.     Results for orders placed or performed during the hospital encounter of 02/03/16 (from the past 48 hour(s))  Culture, blood (routine x 2)     Status: Abnormal (Preliminary result)   Collection Time: 02/03/16  5:45 PM  Result Value Ref Range   Specimen Description BLOOD LEFT ANTECUBITAL    Special Requests BOTTLES DRAWN AEROBIC AND ANAEROBIC 5CC    Culture  Setup Time      GRAM POSITIVE COCCI IN CLUSTERS IN BOTH AEROBIC AND ANAEROBIC BOTTLES CRITICAL RESULT CALLED TO, READ BACK BY AND VERIFIED WITH: M. BELL, PHARMD AT 1640 ON  02/04/16 BY C. JESSUP, MLT.    Culture (A)     STAPHYLOCOCCUS AUREUS SUSCEPTIBILITIES TO FOLLOW Performed at [facility]    Report Status PENDING   Blood Culture ID Panel (Reflexed)     Status: Abnormal   Collection Time: 02/03/16  5:45 PM  Result Value Ref Range   Enterococcus species NOT DETECTED NOT DETECTED   Listeria monocytogenes NOT DETECTED NOT DETECTED   Staphylococcus species DETECTED (A) NOT DETECTED    Comment: CRITICAL RESULT CALLED TO, READ BACK BY AND VERIFIED WITH: M. BELL, PHARMD AT 1640 ON 02/04/16 BY C. JESSUP, MLT.    Staphylococcus aureus DETECTED (A) NOT DETECTED    Comment: CRITICAL RESULT CALLED TO, READ BACK BY AND VERIFIED WITH: M. BELL, PHARMD AT 1640 ON 02/04/16 BY C. JESSUP, MLT.    Methicillin resistance NOT DETECTED NOT DETECTED   Streptococcus species NOT DETECTED NOT DETECTED   Streptococcus agalactiae NOT DETECTED NOT DETECTED   Streptococcus pneumoniae NOT DETECTED NOT DETECTED   Streptococcus pyogenes NOT DETECTED NOT DETECTED   Acinetobacter baumannii NOT DETECTED NOT DETECTED   Enterobacteriaceae species NOT DETECTED NOT DETECTED   Enterobacter cloacae complex NOT DETECTED NOT DETECTED   Escherichia coli NOT DETECTED NOT DETECTED   Klebsiella oxytoca NOT DETECTED NOT DETECTED   Klebsiella pneumoniae NOT DETECTED NOT DETECTED   Proteus species NOT DETECTED NOT DETECTED   Serratia marcescens NOT DETECTED NOT  DETECTED   Haemophilus influenzae NOT DETECTED NOT DETECTED   Neisseria meningitidis NOT DETECTED NOT DETECTED   Pseudomonas aeruginosa NOT DETECTED NOT DETECTED   Candida albicans NOT DETECTED NOT DETECTED   Candida glabrata NOT DETECTED NOT DETECTED   Candida krusei NOT DETECTED NOT DETECTED   Candida parapsilosis NOT DETECTED NOT DETECTED   Candida tropicalis NOT DETECTED NOT DETECTED    Comment: Performed at [facility]  Urinalysis, Routine w reflex microscopic     Status: Abnormal   Collection Time: 02/03/16  6:00 PM  Result Value Ref Range   Color, Urine AMBER (A) YELLOW    Comment: BIOCHEMICALS MAY BE AFFECTED BY COLOR   APPearance CLOUDY (A) CLEAR   Specific Gravity, Urine 1.020 1.005 - 1.030   pH 5.5 5.0 - 8.0   Glucose, UA 100 (A) NEGATIVE mg/dL   Hgb urine dipstick LARGE (A) NEGATIVE   Bilirubin Urine SMALL (A) NEGATIVE   Ketones, ur 15 (A) NEGATIVE mg/dL   Protein, ur 100 (A) NEGATIVE mg/dL   Nitrite NEGATIVE NEGATIVE   Leukocytes, UA SMALL (A) NEGATIVE  Urine microscopic-add on     Status: Abnormal   Collection Time: 02/03/16  6:00 PM  Result Value Ref Range   Squamous Epithelial / LPF 0-5 (A) NONE SEEN   WBC, UA 6-30 0 - 5 WBC/hpf   RBC / HPF 0-5 0 - 5 RBC/hpf   Bacteria, UA FEW (A) NONE SEEN   Casts GRANULAR CAST (A) NEGATIVE   Urine-Other AMORPHOUS URATES/PHOSPHATES     Comment: YEAST  Culture, blood (routine x 2)     Status: Abnormal (Preliminary result)   Collection Time: 02/03/16  6:15 PM  Result Value Ref Range   Specimen Description BLOOD RIGHT ARM    Special Requests BOTTLES DRAWN AEROBIC AND ANAEROBIC 10CC    Culture  Setup Time      GRAM POSITIVE COCCI IN CLUSTERS IN BOTH AEROBIC AND ANAEROBIC BOTTLES CRITICAL VALUE NOTED.  VALUE IS CONSISTENT WITH PREVIOUSLY REPORTED AND CALLED VALUE.    Culture (A)  STAPHYLOCOCCUS AUREUS SUSCEPTIBILITIES TO FOLLOW Performed at [facility]    Report Status PENDING   I-Stat CG4 Lactic Acid,  ED     Status: None   Collection Time: 02/03/16  6:28 PM  Result Value Ref Range   Lactic Acid, Venous 0.62 0.5 - 1.9 mmol/L  CBG monitoring, ED     Status: Abnormal   Collection Time: 02/03/16  8:17 PM  Result Value Ref Range   Glucose-Capillary 215 (H) 65 - 99 mg/dL  Glucose, capillary     Status: Abnormal   Collection Time: 02/03/16  9:31 PM  Result Value Ref Range   Glucose-Capillary 196 (H) 65 - 99 mg/dL  Type and screen [place]     Status: None (Preliminary result)   Collection Time: 02/04/16  7:17 AM  Result Value Ref Range   ABO/RH(D) B POS    Antibody Screen NEG    Sample Expiration 02/07/2016    Unit Number [id number]    Blood Component Type RED CELLS,LR    Unit division 00    Status of Unit ISSUED    Transfusion Status OK TO TRANSFUSE    Crossmatch Result Compatible    Unit Number [id number]    Blood Component Type RED CELLS,LR    Unit division 00    Status of Unit ISSUED    Transfusion Status OK TO TRANSFUSE    Crossmatch Result Compatible    Unit Number [id number]    Blood Component Type RED CELLS,LR    Unit division 00    Status of Unit ALLOCATED    Transfusion Status OK TO TRANSFUSE    Crossmatch Result Compatible    Unit Number [id number]    Blood Component Type RED CELLS,LR    Unit division 00    Status of Unit ALLOCATED    Transfusion Status OK TO TRANSFUSE    Crossmatch Result Compatible   Vitamin B12     Status: Abnormal   Collection Time: 02/04/16  7:17 AM  Result Value Ref Range   Vitamin B-12 1,066 (H) 180 - 914 pg/mL    Comment: (NOTE) This assay is not validated for testing neonatal or myeloproliferative syndrome specimens for Vitamin B12 levels.   Folate     Status: None   Collection Time: 02/04/16  7:17 AM  Result Value Ref Range   Folate 22.1 >5.9 ng/mL  Iron and TIBC     Status: Abnormal   Collection Time: 02/04/16  7:17 AM  Result Value Ref Range   Iron 17 (L) 28 - 170 ug/dL   TIBC 132 (L) 250 -  450 ug/dL   Saturation Ratios 13 10.4 - 31.8 %   UIBC 115 ug/dL  Ferritin     Status: Abnormal   Collection Time: 02/04/16  7:17 AM  Result Value Ref Range   Ferritin 1,105 (H) 11 - 307 ng/mL  Reticulocytes     Status: Abnormal   Collection Time: 02/04/16  7:17 AM  Result Value Ref Range   Retic Ct Pct 1.3 0.4 - 3.1 %   RBC. 1.95 (L) 3.87 - 5.11 MIL/uL   Retic Count, Manual 25.4 19.0 - 186.0 K/uL  ABO/Rh     Status: None   Collection Time: 02/04/16  7:17 AM  Result Value Ref Range   ABO/RH(D) B POS   Glucose, capillary     Status: Abnormal   Collection Time: 02/04/16  7:51 AM  Result Value Ref Range   Glucose-Capillary 261 (H) 65 -  99 mg/dL  Respiratory Panel by PCR     Status: None   Collection Time: 02/04/16  8:57 AM  Result Value Ref Range   Adenovirus NOT DETECTED NOT DETECTED   Coronavirus 229E NOT DETECTED NOT DETECTED   Coronavirus HKU1 NOT DETECTED NOT DETECTED   Coronavirus NL63 NOT DETECTED NOT DETECTED   Coronavirus OC43 NOT DETECTED NOT DETECTED   Metapneumovirus NOT DETECTED NOT DETECTED   Rhinovirus / Enterovirus NOT DETECTED NOT DETECTED   Influenza A NOT DETECTED NOT DETECTED   Influenza B NOT DETECTED NOT DETECTED   Parainfluenza Virus 1 NOT DETECTED NOT DETECTED   Parainfluenza Virus 2 NOT DETECTED NOT DETECTED   Parainfluenza Virus 3 NOT DETECTED NOT DETECTED   Parainfluenza Virus 4 NOT DETECTED NOT DETECTED   Respiratory Syncytial Virus NOT DETECTED NOT DETECTED   Bordetella pertussis NOT DETECTED NOT DETECTED   Chlamydophila pneumoniae NOT DETECTED NOT DETECTED   Mycoplasma pneumoniae NOT DETECTED NOT DETECTED  Glucose, capillary     Status: Abnormal   Collection Time: 02/04/16 12:12 PM  Result Value Ref Range   Glucose-Capillary 187 (H) 65 - 99 mg/dL  Glucose, capillary     Status: Abnormal   Collection Time: 02/04/16  5:27 PM  Result Value Ref Range   Glucose-Capillary 161 (H) 65 - 99 mg/dL  Glucose, capillary     Status: Abnormal   Collection  Time: 02/04/16  8:23 PM  Result Value Ref Range   Glucose-Capillary 165 (H) 65 - 99 mg/dL  CBC WITH DIFFERENTIAL     Status: Abnormal   Collection Time: 02/05/16  5:53 AM  Result Value Ref Range   WBC 9.8 4.0 - 10.5 K/uL   RBC 1.94 (L) 3.87 - 5.11 MIL/uL   Hemoglobin 5.6 (LL) 12.0 - 15.0 g/dL    Comment: REPEATED TO VERIFY CRITICAL RESULT CALLED TO, READ BACK BY AND VERIFIED WITH: L CARMAN,RN [id number] WILDERK    HCT 17.5 (L) 36.0 - 46.0 %   MCV 90.2 78.0 - 100.0 fL   MCH 28.9 26.0 - 34.0 pg   MCHC 32.0 30.0 - 36.0 g/dL   RDW 14.9 11.5 - 15.5 %   Platelets 399 150 - 400 K/uL   Neutrophils Relative % 66 %   Neutro Abs 6.5 1.7 - 7.7 K/uL   Lymphocytes Relative 21 %   Lymphs Abs 2.0 0.7 - 4.0 K/uL   Monocytes Relative 10 %   Monocytes Absolute 1.0 0.1 - 1.0 K/uL   Eosinophils Relative 3 %   Eosinophils Absolute 0.2 0.0 - 0.7 K/uL   Basophils Relative 0 %   Basophils Absolute 0.0 0.0 - 0.1 K/uL  Basic metabolic panel     Status: Abnormal   Collection Time: 02/05/16  5:53 AM  Result Value Ref Range   Sodium 135 135 - 145 mmol/L   Potassium 4.4 3.5 - 5.1 mmol/L   Chloride 105 101 - 111 mmol/L   CO2 23 22 - 32 mmol/L   Glucose, Bld 144 (H) 65 - 99 mg/dL   BUN 23 (H) 6 - 20 mg/dL   Creatinine, Ser 1.02 (H) 0.44 - 1.00 mg/dL   Calcium 8.2 (L) 8.9 - 10.3 mg/dL   GFR calc non Af Amer 59 (L) >60 mL/min   GFR calc Af Amer >60 >60 mL/min    Comment: (NOTE) The eGFR has been calculated using the CKD EPI equation. This calculation has not been validated in all clinical situations. eGFR's persistently <60 mL/min signify possible  Chronic Kidney Disease.    Anion gap 7 5 - 15  Glucose, capillary     Status: Abnormal   Collection Time: 02/05/16  7:29 AM  Result Value Ref Range   Glucose-Capillary 137 (H) 65 - 99 mg/dL  Prepare RBC     Status: None   Collection Time: 02/05/16  7:36 AM  Result Value Ref Range   Order Confirmation ORDER PROCESSED BY BLOOD BANK   Glucose, capillary      Status: Abnormal   Collection Time: 02/05/16 11:48 AM  Result Value Ref Range   Glucose-Capillary 133 (H) 65 - 99 mg/dL      Component Value Date/Time   SDES BLOOD RIGHT ARM 02/03/2016 1815   SPECREQUEST BOTTLES DRAWN AEROBIC AND ANAEROBIC 10CC 02/03/2016 1815   CULT (A) 02/03/2016 1815    STAPHYLOCOCCUS AUREUS SUSCEPTIBILITIES TO FOLLOW Performed at [place] [facility] PENDING 02/03/2016 1815   No results found. Recent Results (from the past 240 hour(s))  Culture, blood (routine x 2)     Status: Abnormal (Preliminary result)   Collection Time: 02/03/16  5:45 PM  Result Value Ref Range Status   Specimen Description BLOOD LEFT ANTECUBITAL  Final   Special Requests BOTTLES DRAWN AEROBIC AND ANAEROBIC 5CC  Final   Culture  Setup Time   Final    GRAM POSITIVE COCCI IN CLUSTERS IN BOTH AEROBIC AND ANAEROBIC BOTTLES CRITICAL RESULT CALLED TO, READ BACK BY AND VERIFIED WITH: M. BELL, PHARMD AT 1640 ON 02/04/16 BY C. JESSUP, MLT.    Culture (A)  Final    STAPHYLOCOCCUS AUREUS SUSCEPTIBILITIES TO FOLLOW Performed at [facility]    Report Status PENDING  Incomplete  Blood Culture ID Panel (Reflexed)     Status: Abnormal   Collection Time: 02/03/16  5:45 PM  Result Value Ref Range Status   Enterococcus species NOT DETECTED NOT DETECTED Final   Listeria monocytogenes NOT DETECTED NOT DETECTED Final   Staphylococcus species DETECTED (A) NOT DETECTED Final    Comment: CRITICAL RESULT CALLED TO, READ BACK BY AND VERIFIED WITH: M. BELL, PHARMD AT 1640 ON 02/04/16 BY C. JESSUP, MLT.    Staphylococcus aureus DETECTED (A) NOT DETECTED Final    Comment: CRITICAL RESULT CALLED TO, READ BACK BY AND VERIFIED WITH: M. BELL, PHARMD AT 1640 ON 02/04/16 BY C. JESSUP, MLT.    Methicillin resistance NOT DETECTED NOT DETECTED Final   Streptococcus species NOT DETECTED NOT DETECTED Final   Streptococcus agalactiae NOT DETECTED NOT DETECTED Final   Streptococcus pneumoniae  NOT DETECTED NOT DETECTED Final   Streptococcus pyogenes NOT DETECTED NOT DETECTED Final   Acinetobacter baumannii NOT DETECTED NOT DETECTED Final   Enterobacteriaceae species NOT DETECTED NOT DETECTED Final   Enterobacter cloacae complex NOT DETECTED NOT DETECTED Final   Escherichia coli NOT DETECTED NOT DETECTED Final   Klebsiella oxytoca NOT DETECTED NOT DETECTED Final   Klebsiella pneumoniae NOT DETECTED NOT DETECTED Final   Proteus species NOT DETECTED NOT DETECTED Final   Serratia marcescens NOT DETECTED NOT DETECTED Final   Haemophilus influenzae NOT DETECTED NOT DETECTED Final   Neisseria meningitidis NOT DETECTED NOT DETECTED Final   Pseudomonas aeruginosa NOT DETECTED NOT DETECTED Final   Candida albicans NOT DETECTED NOT DETECTED Final   Candida glabrata NOT DETECTED NOT DETECTED Final   Candida krusei NOT DETECTED NOT DETECTED Final   Candida parapsilosis NOT DETECTED NOT DETECTED Final   Candida tropicalis NOT DETECTED NOT DETECTED Final  Comment: Performed at [facility]  Culture, blood (routine x 2)     Status: Abnormal (Preliminary result)   Collection Time: 02/03/16  6:15 PM  Result Value Ref Range Status   Specimen Description BLOOD RIGHT ARM  Final   Special Requests BOTTLES DRAWN AEROBIC AND ANAEROBIC 10CC  Final   Culture  Setup Time   Final    GRAM POSITIVE COCCI IN CLUSTERS IN BOTH AEROBIC AND ANAEROBIC BOTTLES CRITICAL VALUE NOTED.  VALUE IS CONSISTENT WITH PREVIOUSLY REPORTED AND CALLED VALUE.    Culture (A)  Final    STAPHYLOCOCCUS AUREUS SUSCEPTIBILITIES TO FOLLOW Performed at [facility]    Report Status PENDING  Incomplete  Respiratory Panel by PCR     Status: None   Collection Time: 02/04/16  8:57 AM  Result Value Ref Range Status   Adenovirus NOT DETECTED NOT DETECTED Final   Coronavirus 229E NOT DETECTED NOT DETECTED Final   Coronavirus HKU1 NOT DETECTED NOT DETECTED Final   Coronavirus NL63 NOT DETECTED NOT DETECTED Final    Coronavirus OC43 NOT DETECTED NOT DETECTED Final   Metapneumovirus NOT DETECTED NOT DETECTED Final   Rhinovirus / Enterovirus NOT DETECTED NOT DETECTED Final   Influenza A NOT DETECTED NOT DETECTED Final   Influenza B NOT DETECTED NOT DETECTED Final   Parainfluenza Virus 1 NOT DETECTED NOT DETECTED Final   Parainfluenza Virus 2 NOT DETECTED NOT DETECTED Final   Parainfluenza Virus 3 NOT DETECTED NOT DETECTED Final   Parainfluenza Virus 4 NOT DETECTED NOT DETECTED Final   Respiratory Syncytial Virus NOT DETECTED NOT DETECTED Final   Bordetella pertussis NOT DETECTED NOT DETECTED Final   Chlamydophila pneumoniae NOT DETECTED NOT DETECTED Final   Mycoplasma pneumoniae NOT DETECTED NOT DETECTED Final      02/05/2016, 4:35 PM     LOS: 2 days    Records and images were personally review

## 2016-02-05 NOTE — Progress Notes (Addendum)
PROGRESS NOTE                                                                                                                                                                                                             Patient Demographics:    Dana [name], is a 60 y.o. female, DOB - 06-13-1955, OVF:[id number]  Admit date - 02/03/2016   Admitting Physician Rise Patience, MD  Outpatient Primary MD for the patient is [name], PA-C  LOS - 2  Outpatient Specialists:  Chief Complaint  Patient presents with  . Emesis    chills, weakness       Brief Narrative  60 year old female wit history of insulin-dependent diabetes mellitus (on insulin pump), diabetic neuropathy, chronic diabetic foot ulcer status post recent debridement, iron deficiency anemia with malabsorption (receives when necessary iron infusion as outpatient), multiple UTIs, bipolar disorder who presented to [facility] with nausea and vomiting associated with fatigue and dysuria. She had 2 episode of vomiting on the day prior to admission associated with chills and fever of 101F. Patient was found to have UTI with mild leukocytosis and acute kidney injury. admitted to hospitalist service.   Subjective:   Noted for drop in H&H. Denies any specific symptoms. No body aches or fever.   Assessment  & Plan :    Principal Problem:   SIRS secondary to UTI (lower urinary tract infection) Blood cultures 2/2 growing MSSA. Pending sensitivity. ?urinary source. Urine culture unfortunately not sent on admission.Check 2-D echo to rule out vegetation.  Antibiotic switched to Ancef. Gentle IV hydration, supportive care with antiemetics.  Active Problems:   DM (diabetes mellitus) type II uncontrolled with eye manifestation ([place]) On metformin and insulin pump. (30 units basal, 6 units breakfast and 8 units each with lunch and dinner) Stable on  basal insulin with sliding scale coverage..    Bilateral diabetic foot ulcer associated with secondary diabetes mellitus ([place]) Recent debridement as outpatient. Has bilateral foot ulcers. Wound care consult appreciated.    Diabetic ulcer of toe ([place])  Wound care consulted   Acute kidney injury Secondary to dehydration. Mild. Improved with IV hydration  Dyslipidemia Continue statin  Bipolar disorder with depression Continue Lexapro    Code Status : Full code  Family Communication  : None at bedside  Disposition Plan  :  Home pending final blood culture sensitivity, improvement in hemoglobin  Barriers For Discharge : Active symptoms  Consults  : None  Procedures  : None   DVT Prophylaxis  :  Lovenox -   Lab Results  Component Value Date   PLT 399 02/05/2016    Antibiotics  :    Anti-infectives    Start     Dose/Rate Route Frequency Ordered Stop   02/04/16 2000  cefTRIAXone (ROCEPHIN) 1 g in dextrose 5 % 50 mL IVPB  Status:  Discontinued     1 g 100 mL/hr over 30 Minutes Intravenous Every 24 hours 02/03/16 2318 02/04/16 1708   02/04/16 1730  ceFAZolin (ANCEF) IVPB 1 g/50 mL premix     1 g 100 mL/hr over 30 Minutes Intravenous Every 8 hours 02/04/16 1708     02/03/16 1930  cefTRIAXone (ROCEPHIN) 1 g in dextrose 5 % 50 mL IVPB     1 g 100 mL/hr over 30 Minutes Intravenous  Once 02/03/16 1923 02/03/16 2030        Objective:   Vitals:   02/05/16 0552 02/05/16 0831 02/05/16 0859 02/05/16 1200  BP: 119/69 117/67 123/65 117/67  Pulse: 92 98 87 76  Resp: 18 18 18 18   Temp: 98.8 F (37.1 C) 99.8 F (37.7 C) 98.4 F (36.9 C) 99.2 F (37.3 C)  TempSrc: Oral Oral Oral Oral  SpO2: 100% 99% 100% 100%  Weight:      Height:        Wt Readings from Last 3 Encounters:  02/03/16 78.7 kg (173 lb 9.6 oz)  01/24/16 79 kg (174 lb 3.2 oz)  01/03/16 79 kg (174 lb 3.2 oz)     Intake/Output Summary (Last 24 hours) at 02/05/16 1248 Last data filed at 02/05/16 1200   Gross per 24 hour  Intake             4182 ml  Output                0 ml  Net             4182 ml     Physical Exam  Gen: not in distress HEENT: Pallor present, moist mucosa, supple neck Chest: clear b/l, no added sounds CVS: N S1&S2, no murmurs, rubs or gallop GI: soft, NT, ND, BS+ Musculoskeletal: warm, no edema, Bilateral foot ulcers, left foot ray amputation     Data Review:    CBC  Recent Labs Lab 02/03/16 1635 02/05/16 0553  WBC 12.5* 9.8  HGB 7.1* 5.6*  HCT 21.7* 17.5*  PLT 503* 399  MCV 89.3 90.2  MCH 29.2 28.9  MCHC 32.7 32.0  RDW 13.6 14.9  LYMPHSABS 0.8 2.0  MONOABS 0.8 1.0  EOSABS 0.0 0.2  BASOSABS 0.0 0.0    Chemistries   Recent Labs Lab 02/03/16 1635 02/05/16 0553  NA 132* 135  K 4.4 4.4  CL 100* 105  CO2 21* 23  GLUCOSE 257* 144*  BUN 45* 23*  CREATININE 1.32* 1.02*  CALCIUM 8.9 8.2*  AST 17  --   ALT 21  --   ALKPHOS 207*  --   BILITOT 1.0  --    ------------------------------------------------------------------------------------------------------------------ No results for input(s): CHOL, HDL, LDLCALC, TRIG, CHOLHDL, LDLDIRECT in the last 72 hours.  Lab Results  Component Value Date   HGBA1C 7.8 (H) 01/18/2016   ------------------------------------------------------------------------------------------------------------------ No results for input(s): TSH, T4TOTAL, T3FREE, THYROIDAB in the last 72 hours.  Invalid input(s): FREET3 ------------------------------------------------------------------------------------------------------------------  Recent Labs  02/04/16 0717  VITAMINB12 1,066*  FOLATE 22.1  FERRITIN 1,105*  TIBC 132*  IRON 17*  RETICCTPCT 1.3    Coagulation profile No results for input(s): INR, PROTIME in the last 168 hours.  No results for input(s): DDIMER in the last 72 hours.  Cardiac Enzymes No results for input(s): CKMB, TROPONINI, MYOGLOBIN in the last 168 hours.  Invalid input(s):  CK ------------------------------------------------------------------------------------------------------------------ No results found for: BNP  Inpatient Medications  Scheduled Meds: . aspirin  81 mg Oral Daily  . atorvastatin  20 mg Oral QHS  .  ceFAZolin (ANCEF) IV  1 g Intravenous Q8H  . Chlorhexidine Gluconate Cloth  6 each Topical Daily  . escitalopram  10 mg Oral Daily  . gabapentin  1,200 mg Oral TID  . insulin aspart  0-15 Units Subcutaneous TID WC  . insulin glargine  30 Units Subcutaneous QHS  . mupirocin ointment  1 application Nasal BID  . pantoprazole  40 mg Oral Daily   Continuous Infusions: . sodium chloride Stopped (02/05/16 0855)   PRN Meds:.alum & mag hydroxide-simeth, ondansetron  Micro Results Recent Results (from the past 240 hour(s))  Culture, blood (routine x 2)     Status: Abnormal (Preliminary result)   Collection Time: 02/03/16  5:45 PM  Result Value Ref Range Status   Specimen Description BLOOD LEFT ANTECUBITAL  Final   Special Requests BOTTLES DRAWN AEROBIC AND ANAEROBIC 5CC  Final   Culture  Setup Time   Final    GRAM POSITIVE COCCI IN CLUSTERS IN BOTH AEROBIC AND ANAEROBIC BOTTLES CRITICAL RESULT CALLED TO, READ BACK BY AND VERIFIED WITH: M. BELL, PHARMD AT 1640 ON 02/04/16 BY C. JESSUP, MLT.    Culture (A)  Final    STAPHYLOCOCCUS AUREUS SUSCEPTIBILITIES TO FOLLOW Performed at [facility]    Report Status PENDING  Incomplete  Blood Culture ID Panel (Reflexed)     Status: Abnormal   Collection Time: 02/03/16  5:45 PM  Result Value Ref Range Status   Enterococcus species NOT DETECTED NOT DETECTED Final   Listeria monocytogenes NOT DETECTED NOT DETECTED Final   Staphylococcus species DETECTED (A) NOT DETECTED Final    Comment: CRITICAL RESULT CALLED TO, READ BACK BY AND VERIFIED WITH: M. BELL, PHARMD AT 1640 ON 02/04/16 BY C. JESSUP, MLT.    Staphylococcus aureus DETECTED (A) NOT DETECTED Final    Comment: CRITICAL RESULT CALLED  TO, READ BACK BY AND VERIFIED WITH: M. BELL, PHARMD AT 1640 ON 02/04/16 BY C. JESSUP, MLT.    Methicillin resistance NOT DETECTED NOT DETECTED Final   Streptococcus species NOT DETECTED NOT DETECTED Final   Streptococcus agalactiae NOT DETECTED NOT DETECTED Final   Streptococcus pneumoniae NOT DETECTED NOT DETECTED Final   Streptococcus pyogenes NOT DETECTED NOT DETECTED Final   Acinetobacter baumannii NOT DETECTED NOT DETECTED Final   Enterobacteriaceae species NOT DETECTED NOT DETECTED Final   Enterobacter cloacae complex NOT DETECTED NOT DETECTED Final   Escherichia coli NOT DETECTED NOT DETECTED Final   Klebsiella oxytoca NOT DETECTED NOT DETECTED Final   Klebsiella pneumoniae NOT DETECTED NOT DETECTED Final   Proteus species NOT DETECTED NOT DETECTED Final   Serratia marcescens NOT DETECTED NOT DETECTED Final   Haemophilus influenzae NOT DETECTED NOT DETECTED Final   Neisseria meningitidis NOT DETECTED NOT DETECTED Final   Pseudomonas aeruginosa NOT DETECTED NOT DETECTED Final   Candida albicans NOT DETECTED NOT DETECTED Final   Candida glabrata NOT DETECTED NOT DETECTED Final   Candida  krusei NOT DETECTED NOT DETECTED Final   Candida parapsilosis NOT DETECTED NOT DETECTED Final   Candida tropicalis NOT DETECTED NOT DETECTED Final    Comment: Performed at [facility]  Culture, blood (routine x 2)     Status: Abnormal (Preliminary result)   Collection Time: 02/03/16  6:15 PM  Result Value Ref Range Status   Specimen Description BLOOD RIGHT ARM  Final   Special Requests BOTTLES DRAWN AEROBIC AND ANAEROBIC 10CC  Final   Culture  Setup Time   Final    GRAM POSITIVE COCCI IN CLUSTERS IN BOTH AEROBIC AND ANAEROBIC BOTTLES CRITICAL VALUE NOTED.  VALUE IS CONSISTENT WITH PREVIOUSLY REPORTED AND CALLED VALUE.    Culture (A)  Final    STAPHYLOCOCCUS AUREUS SUSCEPTIBILITIES TO FOLLOW Performed at [facility]    Report Status PENDING  Incomplete  Respiratory Panel by  PCR     Status: None   Collection Time: 02/04/16  8:57 AM  Result Value Ref Range Status   Adenovirus NOT DETECTED NOT DETECTED Final   Coronavirus 229E NOT DETECTED NOT DETECTED Final   Coronavirus HKU1 NOT DETECTED NOT DETECTED Final   Coronavirus NL63 NOT DETECTED NOT DETECTED Final   Coronavirus OC43 NOT DETECTED NOT DETECTED Final   Metapneumovirus NOT DETECTED NOT DETECTED Final   Rhinovirus / Enterovirus NOT DETECTED NOT DETECTED Final   Influenza A NOT DETECTED NOT DETECTED Final   Influenza B NOT DETECTED NOT DETECTED Final   Parainfluenza Virus 1 NOT DETECTED NOT DETECTED Final   Parainfluenza Virus 2 NOT DETECTED NOT DETECTED Final   Parainfluenza Virus 3 NOT DETECTED NOT DETECTED Final   Parainfluenza Virus 4 NOT DETECTED NOT DETECTED Final   Respiratory Syncytial Virus NOT DETECTED NOT DETECTED Final   Bordetella pertussis NOT DETECTED NOT DETECTED Final   Chlamydophila pneumoniae NOT DETECTED NOT DETECTED Final   Mycoplasma pneumoniae NOT DETECTED NOT DETECTED Final    Radiology Reports Dg Chest 2 View  Result Date: 02/03/2016 CLINICAL DATA:  Fever EXAM: CHEST  2 VIEW COMPARISON:  06/15/2015 chest radiograph. FINDINGS: Stable cardiomediastinal silhouette with top-normal heart size and aortic atherosclerosis. No pneumothorax. No pleural effusion. Right rotated frontal view. Hypo inspiratory lateral view. No pulmonary edema. Bibasilar lung opacities on the lateral view are favored to represent atelectasis given the absence of opacities on the better inflated frontal view. IMPRESSION: Bibasilar atelectasis.  Otherwise no active disease in the chest. Aortic atherosclerosis. Electronically Signed   By: Ilona Sorrel M.D.   On: 02/03/2016 16:32    Time Spent in minutes  25   Louellen Molder M.D on 02/05/2016 at 12:48 PM  Between 7am to 7pm - Pager - [phone]  After 7pm go to [email or web address] - password [facility]  [facility] -  Office  [phone]

## 2016-02-05 NOTE — Consult Note (Signed)
[facility] CM Primary Care Navigator  02/05/2016  Dana [name] June 17, 1955 [id number]  Met with patient and husband [name]) at the bedside to identify possible discharge needs. Patient states increased weakness and nausea vomiting had led to this admission. Patient endorses [name], PA-C at [facility] as the primary care provider.    Patient shared using [name] at [facility] to obtain medications and she manages her own medicines at home. She mentioned having difficulty affording VEGO pump (insulin) and she verbally agreed to be referred to [place] to seek assistance with this specific medication.    Husband provides transportation to doctors' appointments and he will be the primary caregiver at home. Patient was independent with self care prior to admission as stated.  Patient voiced understanding to call primary care provider's office for a post discharge follow-up appointment within a week or sooner if needs arise. Patient letter provided as a reminder.  For additional questions please contact:  Edwena Felty A. Clyde Upshaw, BSN, RN-BC [facility] PRIMARY CARE Navigator Cell: [phone]

## 2016-02-05 NOTE — Progress Notes (Signed)
CRITICAL VALUE ALERT  Critical value received:  HGB 6.4  Date of notification:  02/05/16   Time of notification:  9:04 PM  Critical value read back:Yes.    Nurse who received alert:  Marin Shutter  MD notified (1st page):  Dr. Baltazar Najjar  Time of first page:  2103  MD notified (2nd page):  Time of second page:  Responding MD:  Dr. Baltazar Najjar  Time MD responded:  2107

## 2016-02-06 ENCOUNTER — Inpatient Hospital Stay (HOSPITAL_COMMUNITY): Payer: PPO

## 2016-02-06 DIAGNOSIS — A4101 Sepsis due to Methicillin susceptible Staphylococcus aureus: Secondary | ICD-10-CM | POA: Diagnosis not present

## 2016-02-06 DIAGNOSIS — L97519 Non-pressure chronic ulcer of other part of right foot with unspecified severity: Secondary | ICD-10-CM | POA: Diagnosis not present

## 2016-02-06 DIAGNOSIS — D509 Iron deficiency anemia, unspecified: Secondary | ICD-10-CM

## 2016-02-06 DIAGNOSIS — M25474 Effusion, right foot: Secondary | ICD-10-CM | POA: Diagnosis not present

## 2016-02-06 DIAGNOSIS — M86171 Other acute osteomyelitis, right ankle and foot: Secondary | ICD-10-CM | POA: Diagnosis not present

## 2016-02-06 DIAGNOSIS — L97529 Non-pressure chronic ulcer of other part of left foot with unspecified severity: Secondary | ICD-10-CM | POA: Diagnosis not present

## 2016-02-06 DIAGNOSIS — B9561 Methicillin susceptible Staphylococcus aureus infection as the cause of diseases classified elsewhere: Secondary | ICD-10-CM | POA: Diagnosis not present

## 2016-02-06 DIAGNOSIS — A419 Sepsis, unspecified organism: Secondary | ICD-10-CM | POA: Diagnosis not present

## 2016-02-06 DIAGNOSIS — L03116 Cellulitis of left lower limb: Secondary | ICD-10-CM | POA: Diagnosis not present

## 2016-02-06 DIAGNOSIS — E11621 Type 2 diabetes mellitus with foot ulcer: Secondary | ICD-10-CM | POA: Diagnosis not present

## 2016-02-06 DIAGNOSIS — N390 Urinary tract infection, site not specified: Secondary | ICD-10-CM

## 2016-02-06 DIAGNOSIS — L02611 Cutaneous abscess of right foot: Secondary | ICD-10-CM | POA: Diagnosis not present

## 2016-02-06 LAB — GLUCOSE, CAPILLARY
GLUCOSE-CAPILLARY: 112 mg/dL — AB (ref 65–99)
GLUCOSE-CAPILLARY: 142 mg/dL — AB (ref 65–99)
GLUCOSE-CAPILLARY: 119 mg/dL — AB (ref 65–99)
GLUCOSE-CAPILLARY: 112 mg/dL — AB (ref 65–99)

## 2016-02-06 LAB — HIV ANTIBODY (ROUTINE TESTING W REFLEX): HIV SCREEN 4TH GENERATION: NONREACTIVE

## 2016-02-06 LAB — CULTURE, BLOOD (ROUTINE X 2)

## 2016-02-06 MED ORDER — GADOBENATE DIMEGLUMINE 529 MG/ML IV SOLN
15.0000 mL | Freq: Once | INTRAVENOUS | Status: AC
  Administered 2016-02-06: 15 mL via INTRAVENOUS

## 2016-02-06 MED ORDER — GUAIFENESIN-DM 100-10 MG/5ML PO SYRP
5.0000 mL | ORAL_SOLUTION | ORAL | Status: DC | PRN
  Administered 2016-02-06 – 2016-02-07 (×2): 5 mL via ORAL
  Filled 2016-02-06 (×2): qty 5

## 2016-02-06 MED ORDER — CEFAZOLIN SODIUM-DEXTROSE 2-4 GM/100ML-% IV SOLN
2.0000 g | Freq: Three times a day (TID) | INTRAVENOUS | Status: DC
  Administered 2016-02-06 – 2016-02-08 (×6): 2 g via INTRAVENOUS
  Filled 2016-02-06 (×9): qty 100

## 2016-02-06 NOTE — Consult Note (Signed)
[place] Nurse wound follow up Wound type: diabetic ulcer Measurement: see Monday note Wound bed: see Monday note Drainage (amount, consistency, odor) copious Periwound: callous, macerated Dressing procedure/placement/frequency: Cleansed with NS, dried, applied Silver Alginate per previous order. Staff has not been using Alginate product, just cleansing and applying foam dressing.  Instructed bedside nurse that orders were in for Alginate and I ordered supplies and dressed wounds but that bedside nurses should re-order when they run out of Alginate, [place] # [phone].  We will not follow, but will remain available to this patient, to nursing, and the medical and/or surgical teams.    Fara Olden, RN-C, WTA-C Wound Treatment Associate

## 2016-02-06 NOTE — Progress Notes (Addendum)
INFECTIOUS DISEASE PROGRESS NOTE  ID: Dana [name] is a 60 y.o. female with  Principal Problem:   UTI (lower urinary tract infection) Active Problems:   Bipolar 1 disorder (HCC)   DM (diabetes mellitus) type II uncontrolled with eye manifestation (H[facility])   Hyperlipidemia LDL goal <100   Diabetic ulcer of toe ([place])   Erythropoietin deficiency anemia   Hiatal hernia   Bilateral diabetic foot ulcer associated with secondary diabetes mellitus (H[facility])   Mild bibasilar atelectasis  Subjective:  No fevers.  Mild intermittent pain in right stump.   Abtx:  Anti-infectives    Start     Dose/Rate Route Frequency Ordered Stop   02/06/16 1400  ceFAZolin (ANCEF) IVPB 2g/100 mL premix     2 g 200 mL/hr over 30 Minutes Intravenous Every 8 hours 02/06/16 1030     02/04/16 2000  cefTRIAXone (ROCEPHIN) 1 g in dextrose 5 % 50 mL IVPB  Status:  Discontinued     1 g 100 mL/hr over 30 Minutes Intravenous Every 24 hours 02/03/16 2318 02/04/16 1708   02/04/16 1730  ceFAZolin (ANCEF) IVPB 1 g/50 mL premix  Status:  Discontinued     1 g 100 mL/hr over 30 Minutes Intravenous Every 8 hours 02/04/16 1708 02/06/16 1030   02/03/16 1930  cefTRIAXone (ROCEPHIN) 1 g in dextrose 5 % 50 mL IVPB     1 g 100 mL/hr over 30 Minutes Intravenous  Once 02/03/16 1923 02/03/16 2030      Medications:  Scheduled: . aspirin  81 mg Oral Daily  . atorvastatin  20 mg Oral QHS  .  ceFAZolin (ANCEF) IV  2 g Intravenous Q8H  . Chlorhexidine Gluconate Cloth  6 each Topical Daily  . escitalopram  10 mg Oral Daily  . gabapentin  1,200 mg Oral TID  . insulin aspart  0-15 Units Subcutaneous TID WC  . insulin glargine  30 Units Subcutaneous QHS  . mupirocin ointment  1 application Nasal BID  . pantoprazole  40 mg Oral Daily    Objective: Vital signs in last 24 hours: Temp:  [98.4 F (36.9 C)-100.7 F (38.2 C)] 98.4 F (36.9 C) (09/27 0922) Pulse Rate:  [74-87] 87 (09/27 0922) Resp:  [16-20] 16 (09/27 0922) BP:  (115-142)/(58-76) 142/71 (09/27 0922) SpO2:  [99 %-100 %] 100 % (09/27 0922)   General appearance: alert, cooperative and no distress Throat: lips, mucosa, and tongue normal; teeth and gums normal Resp: clear to auscultation bilaterally Cardio: regular rate and rhythm, S1, S2 normal, no murmur, click, rub or gallop GI: soft, non-tender; bowel sounds normal; no masses,  no organomegaly Extremities: Right stump +1 DP, wrapped w/swelling, warmth, and erythema up to knee, left foot wrapped as well, +2 DP , BLE extremity with decreased sensory Neurologic: Mental status: Alert, oriented, thought content appropriate  Lab Results  Recent Labs  02/03/16 1635 02/05/16 0553 02/05/16 2014  WBC 12.5* 9.8  --   HGB 7.1* 5.6* 6.4*  HCT 21.7* 17.5* 19.6*  NA 132* 135  --   K 4.4 4.4  --   CL 100* 105  --   CO2 21* 23  --   BUN 45* 23*  --   CREATININE 1.32* 1.02*  --    Liver Panel  Recent Labs  02/03/16 1635  PROT 7.6  ALBUMIN 2.3*  AST 17  ALT 21  ALKPHOS 207*  BILITOT 1.0   Sedimentation Rate No results for input(s): ESRSEDRATE in the last 72 hours. C-Reactive  Protein No results for input(s): CRP in the last 72 hours.  Microbiology: Recent Results (from the past 240 hour(s))  Culture, blood (routine x 2)     Status: Abnormal   Collection Time: 02/03/16  5:45 PM  Result Value Ref Range Status   Specimen Description BLOOD LEFT ANTECUBITAL  Final   Special Requests BOTTLES DRAWN AEROBIC AND ANAEROBIC 5CC  Final   Culture  Setup Time   Final    GRAM POSITIVE COCCI IN CLUSTERS IN BOTH AEROBIC AND ANAEROBIC BOTTLES CRITICAL RESULT CALLED TO, READ BACK BY AND VERIFIED WITH: M. BELL, PHARMD AT 1640 ON 02/04/16 BY C. JESSUP, MLT. Performed at [place] (A)  Final   Report Status 02/06/2016 FINAL  Final   Organism ID, Bacteria STAPHYLOCOCCUS AUREUS  Final      Susceptibility   Staphylococcus aureus - MIC*    CIPROFLOXACIN <=0.5  SENSITIVE Sensitive     ERYTHROMYCIN <=0.25 SENSITIVE Sensitive     GENTAMICIN <=0.5 SENSITIVE Sensitive     OXACILLIN <=0.25 SENSITIVE Sensitive     TETRACYCLINE <=1 SENSITIVE Sensitive     VANCOMYCIN 1 SENSITIVE Sensitive     TRIMETH/SULFA <=10 SENSITIVE Sensitive     CLINDAMYCIN <=0.25 SENSITIVE Sensitive     RIFAMPIN <=0.5 SENSITIVE Sensitive     Inducible Clindamycin NEGATIVE Sensitive     * STAPHYLOCOCCUS AUREUS  Blood Culture ID Panel (Reflexed)     Status: Abnormal   Collection Time: 02/03/16  5:45 PM  Result Value Ref Range Status   Enterococcus species NOT DETECTED NOT DETECTED Final   Listeria monocytogenes NOT DETECTED NOT DETECTED Final   Staphylococcus species DETECTED (A) NOT DETECTED Final    Comment: CRITICAL RESULT CALLED TO, READ BACK BY AND VERIFIED WITH: M. BELL, PHARMD AT 1640 ON 02/04/16 BY C. JESSUP, MLT.    Staphylococcus aureus DETECTED (A) NOT DETECTED Final    Comment: CRITICAL RESULT CALLED TO, READ BACK BY AND VERIFIED WITH: M. BELL, PHARMD AT 1640 ON 02/04/16 BY C. JESSUP, MLT.    Methicillin resistance NOT DETECTED NOT DETECTED Final   Streptococcus species NOT DETECTED NOT DETECTED Final   Streptococcus agalactiae NOT DETECTED NOT DETECTED Final   Streptococcus pneumoniae NOT DETECTED NOT DETECTED Final   Streptococcus pyogenes NOT DETECTED NOT DETECTED Final   Acinetobacter baumannii NOT DETECTED NOT DETECTED Final   Enterobacteriaceae species NOT DETECTED NOT DETECTED Final   Enterobacter cloacae complex NOT DETECTED NOT DETECTED Final   Escherichia coli NOT DETECTED NOT DETECTED Final   Klebsiella oxytoca NOT DETECTED NOT DETECTED Final   Klebsiella pneumoniae NOT DETECTED NOT DETECTED Final   Proteus species NOT DETECTED NOT DETECTED Final   Serratia marcescens NOT DETECTED NOT DETECTED Final   Haemophilus influenzae NOT DETECTED NOT DETECTED Final   Neisseria meningitidis NOT DETECTED NOT DETECTED Final   Pseudomonas aeruginosa NOT DETECTED  NOT DETECTED Final   Candida albicans NOT DETECTED NOT DETECTED Final   Candida glabrata NOT DETECTED NOT DETECTED Final   Candida krusei NOT DETECTED NOT DETECTED Final   Candida parapsilosis NOT DETECTED NOT DETECTED Final   Candida tropicalis NOT DETECTED NOT DETECTED Final    Comment: Performed at [facility]  Culture, blood (routine x 2)     Status: Abnormal   Collection Time: 02/03/16  6:15 PM  Result Value Ref Range Status   Specimen Description BLOOD RIGHT ARM  Final   Special Requests BOTTLES DRAWN AEROBIC AND ANAEROBIC 10CC  Final   Culture  Setup Time   Final    GRAM POSITIVE COCCI IN CLUSTERS IN BOTH AEROBIC AND ANAEROBIC BOTTLES CRITICAL VALUE NOTED.  VALUE IS CONSISTENT WITH PREVIOUSLY REPORTED AND CALLED VALUE.    Culture (A)  Final    STAPHYLOCOCCUS AUREUS SUSCEPTIBILITIES PERFORMED ON PREVIOUS CULTURE WITHIN THE LAST 5 DAYS. Performed at [facility]    Report Status 02/06/2016 FINAL  Final  Respiratory Panel by PCR     Status: None   Collection Time: 02/04/16  8:57 AM  Result Value Ref Range Status   Adenovirus NOT DETECTED NOT DETECTED Final   Coronavirus 229E NOT DETECTED NOT DETECTED Final   Coronavirus HKU1 NOT DETECTED NOT DETECTED Final   Coronavirus NL63 NOT DETECTED NOT DETECTED Final   Coronavirus OC43 NOT DETECTED NOT DETECTED Final   Metapneumovirus NOT DETECTED NOT DETECTED Final   Rhinovirus / Enterovirus NOT DETECTED NOT DETECTED Final   Influenza A NOT DETECTED NOT DETECTED Final   Influenza B NOT DETECTED NOT DETECTED Final   Parainfluenza Virus 1 NOT DETECTED NOT DETECTED Final   Parainfluenza Virus 2 NOT DETECTED NOT DETECTED Final   Parainfluenza Virus 3 NOT DETECTED NOT DETECTED Final   Parainfluenza Virus 4 NOT DETECTED NOT DETECTED Final   Respiratory Syncytial Virus NOT DETECTED NOT DETECTED Final   Bordetella pertussis NOT DETECTED NOT DETECTED Final   Chlamydophila pneumoniae NOT DETECTED NOT DETECTED Final    Mycoplasma pneumoniae NOT DETECTED NOT DETECTED Final    Studies/Results: No results found.   Assessment/Plan:  Total days of antibiotics:   Impression/Recommendation Sepsis MSSA bacteremia Chronic Right diabetic foot ucler DM2 with complications AKI IDA  Continue cefazolin BCx MSSA Await repeat BCx Await TTE to rule out vegetation Await MRI of both her feet Would have ortho see her HIV -  Infectious Diseases (pager) [phone] [email or web address] 02/06/2016, 10:46 AM  LOS: 3 days

## 2016-02-06 NOTE — Care Management Important Message (Signed)
Important Message  Patient Details  Name: Dana [name] MRN: [id number] Date of Birth: March 10, 1956   Medicare Important Message Given:  Yes    Twana Wileman 02/06/2016, 10:22 AM

## 2016-02-06 NOTE — Progress Notes (Signed)
PROGRESS NOTE                                                                                                                                                                                                             Patient Demographics:    Dana [name], is a 60 y.o. female, DOB - Apr 17, 1956, XNA:[id number]  Admit date - 02/03/2016   Admitting Physician Rise Patience, MD  Outpatient Primary MD for the patient is [name], PA-C  LOS - 3  Outpatient Specialists:  Chief Complaint  Patient presents with  . Emesis    chills, weakness       Brief Narrative  60 year old female wit history of insulin-dependent diabetes mellitus (on insulin pump), diabetic neuropathy, chronic diabetic foot ulcer status post recent debridement, iron deficiency anemia with malabsorption (receives when necessary iron infusion as outpatient), multiple UTIs, bipolar disorder who presented to [facility] with nausea and vomiting associated with fatigue and dysuria. She had 2 episode of vomiting on the day prior to admission associated with chills and fever of 101F. Patient was found to have UTI with mild leukocytosis and acute kidney injury. admitted to hospitalist service.   Subjective:   Pt has no new complaints reported to me.   Assessment  & Plan :    Principal Problem:   SIRS secondary to UTI (lower urinary tract infection) Blood cultures 2/2 growing MSSA. Pending sensitivity. ?urinary source. Urine culture unfortunately not sent on admission.Check 2-D echo to rule out vegetation.  Antibiotic managed by ID currently. Gentle IV hydration, supportive care with antiemetics.  Active Problems:   DM (diabetes mellitus) type II uncontrolled with eye manifestation ([employer]) On metformin and insulin pump. (30 units basal, 6 units breakfast and 8 units each with lunch and dinner) Stable on basal insulin with sliding scale  coverage..    Bilateral diabetic foot ulcer associated with secondary diabetes mellitus ([place]) Recent debridement as outpatient. Has bilateral foot ulcers. Wound care consult appreciated. - Awaiting MRI. Based on results will plan on consulting Ortho.    Diabetic ulcer of toe ([place])  Wound care consulted   Acute kidney injury Secondary to dehydration. Mild. Improved with IV hydration  Dyslipidemia Continue statin  Bipolar disorder with depression Continue Lexapro  Code Status : Full code  Family Communication  : None at bedside  Disposition Plan  : Home pending final blood culture sensitivity, improvement in hemoglobin  Barriers For Discharge : Active symptoms  Consults  : None  Procedures  : None   DVT Prophylaxis  :  Lovenox -   Lab Results  Component Value Date   PLT 399 02/05/2016    Antibiotics  :    Anti-infectives    Start     Dose/Rate Route Frequency Ordered Stop   02/06/16 1400  ceFAZolin (ANCEF) IVPB 2g/100 mL premix     2 g 200 mL/hr over 30 Minutes Intravenous Every 8 hours 02/06/16 1030     02/04/16 2000  cefTRIAXone (ROCEPHIN) 1 g in dextrose 5 % 50 mL IVPB  Status:  Discontinued     1 g 100 mL/hr over 30 Minutes Intravenous Every 24 hours 02/03/16 2318 02/04/16 1708   02/04/16 1730  ceFAZolin (ANCEF) IVPB 1 g/50 mL premix  Status:  Discontinued     1 g 100 mL/hr over 30 Minutes Intravenous Every 8 hours 02/04/16 1708 02/06/16 1030   02/03/16 1930  cefTRIAXone (ROCEPHIN) 1 g in dextrose 5 % 50 mL IVPB     1 g 100 mL/hr over 30 Minutes Intravenous  Once 02/03/16 1923 02/03/16 2030        Objective:   Vitals:   02/06/16 0351 02/06/16 0430 02/06/16 0615 02/06/16 0922  BP: 131/76 128/75 140/71 (!) 142/71  Pulse: 74 76 83 87  Resp: 16 16 16 16   Temp: 99.3 F (37.4 C) 99.6 F (37.6 C) 99.3 F (37.4 C) 98.4 F (36.9 C)  TempSrc: Oral Oral Oral Oral  SpO2:  99% 100% 100%  Weight:      Height:        Wt Readings from Last 3 Encounters:    02/03/16 78.7 kg (173 lb 9.6 oz)  01/24/16 79 kg (174 lb 3.2 oz)  01/03/16 79 kg (174 lb 3.2 oz)     Intake/Output Summary (Last 24 hours) at 02/06/16 1610 Last data filed at 02/06/16 0900  Gross per 24 hour  Intake          2793.08 ml  Output                0 ml  Net          2793.08 ml     Physical Exam  Gen: not in distress, alert and awake HEENT: Pallor present, moist mucosa, supple neck Chest: clear b/l, no added sounds CVS: N S1&S2, no murmurs, rubs or gallop GI: soft, NT, ND, BS+ Musculoskeletal: warm, no edema, Bilateral foot ulcers, left foot ray amputation     Data Review:    CBC  Recent Labs Lab 02/03/16 1635 02/05/16 0553 02/05/16 2014  WBC 12.5* 9.8  --   HGB 7.1* 5.6* 6.4*  HCT 21.7* 17.5* 19.6*  PLT 503* 399  --   MCV 89.3 90.2  --   MCH 29.2 28.9  --   MCHC 32.7 32.0  --   RDW 13.6 14.9  --   LYMPHSABS 0.8 2.0  --   MONOABS 0.8 1.0  --   EOSABS 0.0 0.2  --   BASOSABS 0.0 0.0  --     Chemistries   Recent Labs Lab 02/03/16 1635 02/05/16 0553  NA 132* 135  K 4.4 4.4  CL 100* 105  CO2 21* 23  GLUCOSE 257* 144*  BUN 45* 23*  CREATININE 1.32* 1.02*  CALCIUM 8.9 8.2*  AST 17  --  ALT 21  --   ALKPHOS 207*  --   BILITOT 1.0  --    ------------------------------------------------------------------------------------------------------------------ No results for input(s): CHOL, HDL, LDLCALC, TRIG, CHOLHDL, LDLDIRECT in the last 72 hours.  Lab Results  Component Value Date   HGBA1C 7.8 (H) 01/18/2016   ------------------------------------------------------------------------------------------------------------------ No results for input(s): TSH, T4TOTAL, T3FREE, THYROIDAB in the last 72 hours.  Invalid input(s): FREET3 ------------------------------------------------------------------------------------------------------------------  Recent Labs  02/04/16 0717  VITAMINB12 1,066*  FOLATE 22.1  FERRITIN 1,105*  TIBC 132*  IRON  17*  RETICCTPCT 1.3    Coagulation profile No results for input(s): INR, PROTIME in the last 168 hours.  No results for input(s): DDIMER in the last 72 hours.  Cardiac Enzymes No results for input(s): CKMB, TROPONINI, MYOGLOBIN in the last 168 hours.  Invalid input(s): CK ------------------------------------------------------------------------------------------------------------------ No results found for: BNP  Inpatient Medications  Scheduled Meds: . aspirin  81 mg Oral Daily  . atorvastatin  20 mg Oral QHS  .  ceFAZolin (ANCEF) IV  2 g Intravenous Q8H  . Chlorhexidine Gluconate Cloth  6 each Topical Daily  . escitalopram  10 mg Oral Daily  . gabapentin  1,200 mg Oral TID  . insulin aspart  0-15 Units Subcutaneous TID WC  . insulin glargine  30 Units Subcutaneous QHS  . mupirocin ointment  1 application Nasal BID  . pantoprazole  40 mg Oral Daily   Continuous Infusions: . sodium chloride 75 mL/hr at 02/06/16 1442   PRN Meds:.acetaminophen, alum & mag hydroxide-simeth, cyclobenzaprine, ondansetron  Micro Results Recent Results (from the past 240 hour(s))  Culture, blood (routine x 2)     Status: Abnormal   Collection Time: 02/03/16  5:45 PM  Result Value Ref Range Status   Specimen Description BLOOD LEFT ANTECUBITAL  Final   Special Requests BOTTLES DRAWN AEROBIC AND ANAEROBIC 5CC  Final   Culture  Setup Time   Final    GRAM POSITIVE COCCI IN CLUSTERS IN BOTH AEROBIC AND ANAEROBIC BOTTLES CRITICAL RESULT CALLED TO, READ BACK BY AND VERIFIED WITH: M. BELL, PHARMD AT 1640 ON 02/04/16 BY C. JESSUP, MLT. Performed at [place] (A)  Final   Report Status 02/06/2016 FINAL  Final   Organism ID, Bacteria STAPHYLOCOCCUS AUREUS  Final      Susceptibility   Staphylococcus aureus - MIC*    CIPROFLOXACIN <=0.5 SENSITIVE Sensitive     ERYTHROMYCIN <=0.25 SENSITIVE Sensitive     GENTAMICIN <=0.5 SENSITIVE Sensitive     OXACILLIN  <=0.25 SENSITIVE Sensitive     TETRACYCLINE <=1 SENSITIVE Sensitive     VANCOMYCIN 1 SENSITIVE Sensitive     TRIMETH/SULFA <=10 SENSITIVE Sensitive     CLINDAMYCIN <=0.25 SENSITIVE Sensitive     RIFAMPIN <=0.5 SENSITIVE Sensitive     Inducible Clindamycin NEGATIVE Sensitive     * STAPHYLOCOCCUS AUREUS  Blood Culture ID Panel (Reflexed)     Status: Abnormal   Collection Time: 02/03/16  5:45 PM  Result Value Ref Range Status   Enterococcus species NOT DETECTED NOT DETECTED Final   Listeria monocytogenes NOT DETECTED NOT DETECTED Final   Staphylococcus species DETECTED (A) NOT DETECTED Final    Comment: CRITICAL RESULT CALLED TO, READ BACK BY AND VERIFIED WITH: M. BELL, PHARMD AT 1640 ON 02/04/16 BY C. JESSUP, MLT.    Staphylococcus aureus DETECTED (A) NOT DETECTED Final    Comment: CRITICAL RESULT CALLED TO, READ BACK BY AND VERIFIED WITH: M. BELL, PHARMD AT 1640  ON 02/04/16 BY C. JESSUP, MLT.    Methicillin resistance NOT DETECTED NOT DETECTED Final   Streptococcus species NOT DETECTED NOT DETECTED Final   Streptococcus agalactiae NOT DETECTED NOT DETECTED Final   Streptococcus pneumoniae NOT DETECTED NOT DETECTED Final   Streptococcus pyogenes NOT DETECTED NOT DETECTED Final   Acinetobacter baumannii NOT DETECTED NOT DETECTED Final   Enterobacteriaceae species NOT DETECTED NOT DETECTED Final   Enterobacter cloacae complex NOT DETECTED NOT DETECTED Final   Escherichia coli NOT DETECTED NOT DETECTED Final   Klebsiella oxytoca NOT DETECTED NOT DETECTED Final   Klebsiella pneumoniae NOT DETECTED NOT DETECTED Final   Proteus species NOT DETECTED NOT DETECTED Final   Serratia marcescens NOT DETECTED NOT DETECTED Final   Haemophilus influenzae NOT DETECTED NOT DETECTED Final   Neisseria meningitidis NOT DETECTED NOT DETECTED Final   Pseudomonas aeruginosa NOT DETECTED NOT DETECTED Final   Candida albicans NOT DETECTED NOT DETECTED Final   Candida glabrata NOT DETECTED NOT DETECTED Final     Candida krusei NOT DETECTED NOT DETECTED Final   Candida parapsilosis NOT DETECTED NOT DETECTED Final   Candida tropicalis NOT DETECTED NOT DETECTED Final    Comment: Performed at [facility]  Culture, blood (routine x 2)     Status: Abnormal   Collection Time: 02/03/16  6:15 PM  Result Value Ref Range Status   Specimen Description BLOOD RIGHT ARM  Final   Special Requests BOTTLES DRAWN AEROBIC AND ANAEROBIC 10CC  Final   Culture  Setup Time   Final    GRAM POSITIVE COCCI IN CLUSTERS IN BOTH AEROBIC AND ANAEROBIC BOTTLES CRITICAL VALUE NOTED.  VALUE IS CONSISTENT WITH PREVIOUSLY REPORTED AND CALLED VALUE.    Culture (A)  Final    STAPHYLOCOCCUS AUREUS SUSCEPTIBILITIES PERFORMED ON PREVIOUS CULTURE WITHIN THE LAST 5 DAYS. Performed at [facility]    Report Status 02/06/2016 FINAL  Final  Respiratory Panel by PCR     Status: None   Collection Time: 02/04/16  8:57 AM  Result Value Ref Range Status   Adenovirus NOT DETECTED NOT DETECTED Final   Coronavirus 229E NOT DETECTED NOT DETECTED Final   Coronavirus HKU1 NOT DETECTED NOT DETECTED Final   Coronavirus NL63 NOT DETECTED NOT DETECTED Final   Coronavirus OC43 NOT DETECTED NOT DETECTED Final   Metapneumovirus NOT DETECTED NOT DETECTED Final   Rhinovirus / Enterovirus NOT DETECTED NOT DETECTED Final   Influenza A NOT DETECTED NOT DETECTED Final   Influenza B NOT DETECTED NOT DETECTED Final   Parainfluenza Virus 1 NOT DETECTED NOT DETECTED Final   Parainfluenza Virus 2 NOT DETECTED NOT DETECTED Final   Parainfluenza Virus 3 NOT DETECTED NOT DETECTED Final   Parainfluenza Virus 4 NOT DETECTED NOT DETECTED Final   Respiratory Syncytial Virus NOT DETECTED NOT DETECTED Final   Bordetella pertussis NOT DETECTED NOT DETECTED Final   Chlamydophila pneumoniae NOT DETECTED NOT DETECTED Final   Mycoplasma pneumoniae NOT DETECTED NOT DETECTED Final  Culture, blood (Routine X 2) w Reflex to ID Panel     Status: None  (Preliminary result)   Collection Time: 02/05/16  8:11 PM  Result Value Ref Range Status   Specimen Description BLOOD LEFT HAND  Final   Special Requests IN PEDIATRIC BOTTLE 3CC  Final   Culture NO GROWTH < 24 HOURS  Final   Report Status PENDING  Incomplete    Radiology Reports Dg Chest 2 View  Result Date: 02/03/2016 CLINICAL DATA:  Fever EXAM: CHEST  2  VIEW COMPARISON:  06/15/2015 chest radiograph. FINDINGS: Stable cardiomediastinal silhouette with top-normal heart size and aortic atherosclerosis. No pneumothorax. No pleural effusion. Right rotated frontal view. Hypo inspiratory lateral view. No pulmonary edema. Bibasilar lung opacities on the lateral view are favored to represent atelectasis given the absence of opacities on the better inflated frontal view. IMPRESSION: Bibasilar atelectasis.  Otherwise no active disease in the chest. Aortic atherosclerosis. Electronically Signed   By: Ilona Sorrel M.D.   On: 02/03/2016 16:32    Time Spent in minutes  25 minutes   Velvet Bathe M.D on 02/06/2016 at 4:10 PM  Between 7am to 7pm - Pager - [phone]  After 7pm go to [email or web address] - password [facility]  [facility] -  Office  [phone]

## 2016-02-07 ENCOUNTER — Encounter: Payer: PPO | Admitting: Neurology

## 2016-02-07 DIAGNOSIS — L97519 Non-pressure chronic ulcer of other part of right foot with unspecified severity: Secondary | ICD-10-CM | POA: Diagnosis not present

## 2016-02-07 DIAGNOSIS — A4101 Sepsis due to Methicillin susceptible Staphylococcus aureus: Secondary | ICD-10-CM | POA: Diagnosis not present

## 2016-02-07 DIAGNOSIS — M86171 Other acute osteomyelitis, right ankle and foot: Secondary | ICD-10-CM | POA: Diagnosis not present

## 2016-02-07 DIAGNOSIS — A419 Sepsis, unspecified organism: Secondary | ICD-10-CM

## 2016-02-07 DIAGNOSIS — E11621 Type 2 diabetes mellitus with foot ulcer: Secondary | ICD-10-CM | POA: Diagnosis not present

## 2016-02-07 DIAGNOSIS — E10621 Type 1 diabetes mellitus with foot ulcer: Secondary | ICD-10-CM | POA: Diagnosis not present

## 2016-02-07 DIAGNOSIS — L02611 Cutaneous abscess of right foot: Secondary | ICD-10-CM | POA: Diagnosis not present

## 2016-02-07 DIAGNOSIS — B9561 Methicillin susceptible Staphylococcus aureus infection as the cause of diseases classified elsewhere: Secondary | ICD-10-CM | POA: Diagnosis not present

## 2016-02-07 DIAGNOSIS — N390 Urinary tract infection, site not specified: Secondary | ICD-10-CM | POA: Diagnosis not present

## 2016-02-07 DIAGNOSIS — M86471 Chronic osteomyelitis with draining sinus, right ankle and foot: Secondary | ICD-10-CM | POA: Diagnosis not present

## 2016-02-07 DIAGNOSIS — L97529 Non-pressure chronic ulcer of other part of left foot with unspecified severity: Secondary | ICD-10-CM | POA: Diagnosis not present

## 2016-02-07 LAB — GLUCOSE, CAPILLARY
GLUCOSE-CAPILLARY: 73 mg/dL (ref 65–99)
GLUCOSE-CAPILLARY: 102 mg/dL — AB (ref 65–99)
Glucose-Capillary: 119 mg/dL — ABNORMAL HIGH (ref 65–99)
Glucose-Capillary: 115 mg/dL — ABNORMAL HIGH (ref 65–99)

## 2016-02-07 LAB — TYPE AND SCREEN
ABO/RH(D): B POS
ANTIBODY SCREEN: NEGATIVE
UNIT DIVISION: 0
UNIT DIVISION: 0
Unit division: 0
Unit division: 0

## 2016-02-07 LAB — CBC
HCT: 30.1 % — ABNORMAL LOW (ref 36.0–46.0)
Hemoglobin: 10 g/dL — ABNORMAL LOW (ref 12.0–15.0)
MCH: 29.1 pg (ref 26.0–34.0)
MCHC: 32.9 g/dL (ref 30.0–36.0)
MCV: 88.5 fL (ref 78.0–100.0)
PLATELETS: 455 10*3/uL — AB (ref 150–400)
RBC: 3.4 MIL/uL — AB (ref 3.87–5.11)
RDW: 15 % (ref 11.5–15.5)
WBC: 11.8 10*3/uL — AB (ref 4.0–10.5)

## 2016-02-07 LAB — RENAL FUNCTION PANEL
ANION GAP: 10 (ref 5–15)
Albumin: 1.8 g/dL — ABNORMAL LOW (ref 3.5–5.0)
BUN: 13 mg/dL (ref 6–20)
CHLORIDE: 100 mmol/L — AB (ref 101–111)
CO2: 23 mmol/L (ref 22–32)
Calcium: 8.7 mg/dL — ABNORMAL LOW (ref 8.9–10.3)
Creatinine, Ser: 0.92 mg/dL (ref 0.44–1.00)
GFR calc Af Amer: >60 mL/min (ref >60)
GFR calc non Af Amer: >60 mL/min (ref >60)
GLUCOSE: 119 mg/dL — AB (ref 65–99)
PHOSPHORUS: 3.9 mg/dL (ref 2.5–4.6)
POTASSIUM: 4.1 mmol/L (ref 3.5–5.1)
Sodium: 133 mmol/L — ABNORMAL LOW (ref 135–145)

## 2016-02-07 LAB — C-REACTIVE PROTEIN: CRP: 17.7 mg/dL — ABNORMAL HIGH (ref <1.0)

## 2016-02-07 LAB — SEDIMENTATION RATE: SED RATE: 130 mm/h — AB (ref 0–22)

## 2016-02-07 NOTE — Progress Notes (Signed)
Patient requested to have SCD's removed for a little while. Patient was educated on their benefit and purpose. Patient still requested to have them removed for a while.

## 2016-02-07 NOTE — Progress Notes (Signed)
INFECTIOUS DISEASE PROGRESS NOTE  ID: Dana [name] is a 60 y.o. 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   Abtx:  Anti-infectives    Start     Dose/Rate Route Frequency Ordered Stop   02/06/16 1400  ceFAZolin (ANCEF) IVPB 2g/100 mL premix     2 g 200 mL/hr over 30 Minutes Intravenous Every 8 hours 02/06/16 1030     02/04/16 2000  cefTRIAXone (ROCEPHIN) 1 g in dextrose 5 % 50 mL IVPB  Status:  Discontinued     1 g 100 mL/hr over 30 Minutes Intravenous Every 24 hours 02/03/16 2318 02/04/16 1708   02/04/16 1730  ceFAZolin (ANCEF) IVPB 1 g/50 mL premix  Status:  Discontinued     1 g 100 mL/hr over 30 Minutes Intravenous Every 8 hours 02/04/16 1708 02/06/16 1030   02/03/16 1930  cefTRIAXone (ROCEPHIN) 1 g in dextrose 5 % 50 mL IVPB     1 g 100 mL/hr over 30 Minutes Intravenous  Once 02/03/16 1923 02/03/16 2030      Medications:  Scheduled: . aspirin  81 mg Oral Daily  . atorvastatin  20 mg Oral QHS  .  ceFAZolin (ANCEF) IV  2 g Intravenous Q8H  . Chlorhexidine Gluconate Cloth  6 each Topical Daily  . escitalopram  10 mg Oral Daily  . gabapentin  1,200 mg Oral TID  . insulin aspart  0-15 Units Subcutaneous TID WC  . insulin glargine  30 Units Subcutaneous QHS  . mupirocin ointment  1 application Nasal BID  . pantoprazole  40 mg Oral Daily    Objective: Vital signs in last 24 hours: Temp:  [98.7 F (37.1 C)-100.2 F (37.9 C)] 100.2 F (37.9 C) (09/28 0942) Pulse Rate:  [78-98] 78 (09/28 0942) Resp:  [16-18] 18 (09/28 0942) BP: (134-157)/(61-76) 146/75 (09/28  0942) SpO2:  [95 %-100 %] 97 % (09/28 0942) Weight:  [81.2 kg (179 lb 1.6 oz)] 81.2 kg (179 lb 1.6 oz) (09/27 2208)   General appearance: alert, cooperative and no distress Resp: clear to auscultation bilaterally Cardio: regular rate and rhythm GI: normal findings: bowel sounds normal and soft, non-tender Extremities: feet wrapped. no proximal erythema  Lab Results  Recent Labs  02/05/16 0553 02/05/16 2014 02/07/16 0244  WBC 9.8  --  11.8*  HGB 5.6* 6.4* 10.0*  HCT 17.5* 19.6* 30.1*  NA 135  --   --   K 4.4  --   --   CL 105  --   --   CO2 23  --   --   BUN 23*  --   --   CREATININE 1.02*  --   --    Liver Panel No results for input(s): PROT, ALBUMIN, AST, ALT, ALKPHOS, BILITOT, BILIDIR, IBILI in the last 72 hours. Sedimentation Rate No results for input(s): ESRSEDRATE in the last 72 hours. C-Reactive Protein No results for input(s): CRP in the last 72 hours.  Microbiology: Recent Results (from the past 240 hour(s))  Culture, blood (routine x 2)     Status: Abnormal   Collection Time: 02/03/16  5:45 PM  Result Value Ref Range Status   Specimen Description BLOOD LEFT ANTECUBITAL  Final   Special Requests BOTTLES DRAWN AEROBIC AND ANAEROBIC 5CC  Final   Culture  Setup Time   Final    GRAM POSITIVE COCCI IN CLUSTERS IN BOTH AEROBIC AND ANAEROBIC BOTTLES CRITICAL RESULT CALLED TO, READ BACK BY AND VERIFIED WITH: M. BELL, PHARMD AT 1640 ON 02/04/16 BY C. JESSUP, MLT. Performed at [facility] (A)  Final   Report Status 02/06/2016 FINAL  Final   Organism ID, Bacteria STAPHYLOCOCCUS AUREUS  Final      Susceptibility   Staphylococcus aureus - MIC*    CIPROFLOXACIN <=0.5 SENSITIVE Sensitive     ERYTHROMYCIN <=0.25 SENSITIVE Sensitive     GENTAMICIN <=0.5 SENSITIVE Sensitive     OXACILLIN <=0.25 SENSITIVE Sensitive     TETRACYCLINE <=1 SENSITIVE Sensitive     VANCOMYCIN 1 SENSITIVE Sensitive     TRIMETH/SULFA <=10 SENSITIVE  Sensitive     CLINDAMYCIN <=0.25 SENSITIVE Sensitive     RIFAMPIN <=0.5 SENSITIVE Sensitive     Inducible Clindamycin NEGATIVE Sensitive     * STAPHYLOCOCCUS AUREUS  Blood Culture ID Panel (Reflexed)     Status: Abnormal   Collection Time: 02/03/16  5:45 PM  Result Value Ref Range Status   Enterococcus species NOT DETECTED NOT DETECTED Final   Listeria monocytogenes NOT DETECTED NOT DETECTED Final   Staphylococcus species DETECTED (A) NOT DETECTED Final    Comment: CRITICAL RESULT CALLED TO, READ BACK BY AND VERIFIED WITH: M. BELL, PHARMD AT 1640 ON 02/04/16 BY C. JESSUP, MLT.    Staphylococcus aureus DETECTED (A) NOT DETECTED Final    Comment: CRITICAL RESULT CALLED TO, READ BACK BY AND VERIFIED WITH: M. BELL, PHARMD AT 1640 ON 02/04/16 BY C. JESSUP, MLT.    Methicillin resistance NOT DETECTED NOT DETECTED Final   Streptococcus species NOT DETECTED NOT DETECTED Final   Streptococcus agalactiae NOT DETECTED NOT DETECTED Final   Streptococcus pneumoniae NOT DETECTED NOT DETECTED Final   Streptococcus pyogenes NOT DETECTED NOT DETECTED Final   Acinetobacter baumannii NOT DETECTED NOT DETECTED Final   Enterobacteriaceae species NOT DETECTED NOT DETECTED Final   Enterobacter cloacae complex NOT DETECTED NOT DETECTED Final   Escherichia coli NOT DETECTED NOT DETECTED Final   Klebsiella oxytoca NOT DETECTED NOT DETECTED Final   Klebsiella pneumoniae NOT DETECTED NOT DETECTED Final   Proteus species NOT DETECTED NOT DETECTED Final   Serratia marcescens NOT DETECTED NOT DETECTED Final   Haemophilus influenzae NOT DETECTED NOT DETECTED Final   Neisseria meningitidis NOT DETECTED NOT DETECTED Final   Pseudomonas aeruginosa NOT DETECTED NOT DETECTED Final   Candida albicans NOT DETECTED NOT DETECTED Final   Candida glabrata NOT DETECTED NOT DETECTED Final   Candida krusei NOT DETECTED NOT DETECTED Final   Candida parapsilosis NOT DETECTED NOT DETECTED Final   Candida tropicalis NOT DETECTED  NOT DETECTED Final    Comment: Performed at [facility]  Culture, blood (routine x 2)     Status: Abnormal   Collection Time: 02/03/16  6:15 PM  Result Value Ref Range Status   Specimen Description BLOOD RIGHT ARM  Final   Special Requests BOTTLES DRAWN AEROBIC AND ANAEROBIC 10CC  Final   Culture  Setup Time   Final    GRAM POSITIVE COCCI IN CLUSTERS IN BOTH AEROBIC AND ANAEROBIC BOTTLES CRITICAL VALUE NOTED.  VALUE IS CONSISTENT WITH PREVIOUSLY REPORTED AND CALLED VALUE.  Culture (A)  Final    STAPHYLOCOCCUS AUREUS SUSCEPTIBILITIES PERFORMED ON PREVIOUS CULTURE WITHIN THE LAST 5 DAYS. Performed at [facility]    Report Status 02/06/2016 FINAL  Final  Respiratory Panel by PCR     Status: None   Collection Time: 02/04/16  8:57 AM  Result Value Ref Range Status   Adenovirus NOT DETECTED NOT DETECTED Final   Coronavirus 229E NOT DETECTED NOT DETECTED Final   Coronavirus HKU1 NOT DETECTED NOT DETECTED Final   Coronavirus NL63 NOT DETECTED NOT DETECTED Final   Coronavirus OC43 NOT DETECTED NOT DETECTED Final   Metapneumovirus NOT DETECTED NOT DETECTED Final   Rhinovirus / Enterovirus NOT DETECTED NOT DETECTED Final   Influenza A NOT DETECTED NOT DETECTED Final   Influenza B NOT DETECTED NOT DETECTED Final   Parainfluenza Virus 1 NOT DETECTED NOT DETECTED Final   Parainfluenza Virus 2 NOT DETECTED NOT DETECTED Final   Parainfluenza Virus 3 NOT DETECTED NOT DETECTED Final   Parainfluenza Virus 4 NOT DETECTED NOT DETECTED Final   Respiratory Syncytial Virus NOT DETECTED NOT DETECTED Final   Bordetella pertussis NOT DETECTED NOT DETECTED Final   Chlamydophila pneumoniae NOT DETECTED NOT DETECTED Final   Mycoplasma pneumoniae NOT DETECTED NOT DETECTED Final  Culture, blood (Routine X 2) w Reflex to ID Panel     Status: None (Preliminary result)   Collection Time: 02/05/16  8:11 PM  Result Value Ref Range Status   Specimen Description BLOOD LEFT HAND  Final   Special  Requests IN PEDIATRIC BOTTLE 3CC  Final   Culture NO GROWTH < 24 HOURS  Final   Report Status PENDING  Incomplete    Studies/Results: Mr Foot Right W Wo Contrast  Result Date: 02/07/2016 CLINICAL DATA:  Diabetic foot ulcers with recent debridement. Diabetic neuropathy. Fever and chills. Draining ulcers of the foot. 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 EXAM: MRI OF THE RIGHT HINDFOOT WITHOUT AND WITH CONTRAST TECHNIQUE: Multiplanar, multisequence MR imaging of the hindfoot was performed before and after contrast was administered. CONTRAST:  15 cc MultiHance COMPARISON:  12/27/2015 radiographs and 08/25/2014 FINDINGS: TENDONS Peroneal: Extensive tenosynovitis. Distal peroneus brevis poorly seen, possibly detached. Peroneus longus becomes an apparent distal to the level of the calcaneus and is likewise likely not attached. Posteromedial: Tibialis posterior and flexor hallucis longus tenosynovitis. The flexor hallucis longus appears to terminate in the medial soft tissues on image 18/2 without attaching the bone. FDL tendon terminates in the vicinity of the soft tissues overlying the metatarsals. Tibialis posterior tendinopathy distally. Anterior: Tibialis anterior tenosynovitis, image 10/6. Achilles: Unremarkable Plantar Fascia: Lax plantar fascia with thickening of the medial band proximally. Violation of the lateral band distally by fluid signal intensity. LIGAMENTS Lateral: Attenuated anterior talofibular ligament, query remote injury. Medial: Probable tear of the medioplantar oblique component of the spring ligament. CARTILAGE Ankle Joint: Small tibiotalar joint effusion with mild synovitis. Subtalar Joints/Sinus Tarsi: Mild synovitis with edema and enhancement in the sinus tarsi. Bones: Abnormal osseous edema and enhancement especially in the cuboid, cuneiform is a, and second through fourth metatarsal bases. On radiographs of 12/27/2015 there is a bony  structure corresponding to the base of the fifth metatarsal; this bony structures no longer well appreciated at least by MRI, and instead there is abnormal inflammatory phlegmon and enhancement in this vicinity, extending down through the lateral band of the plantar fascia and to the plantar cutaneous surface were there is a bandage. I suspect that  this is a draining infectious process. The fifth metatarsal base has probably been destroyed by this process. There is a less degree of edema and enhancement anteriorly in the calcaneus and in the navicular. Other: There is a 1.6 by 0.9 by 1.3 cm (volume = 1 cm^3) fluid collection with enhancing margins plantar to the medial cuneiform. Extensive cutaneous and subcutaneous edema along the remaining foot especially in the midfoot region. Small foci of low T1 and T2 signal in the soft tissues plantar to the cuneiform is a an Lisfranc joint as on images 20-22 of series 4, probably small amounts of gas in the soft tissues, but possibly small metal fragments. IMPRESSION: 1. Osseous edema and enhancement compatible with osteomyelitis involving the cuneiform is, cuboid, and bases of the second through fourth metatarsals. I suspect the base of the fifth metatarsal has been intervally destroyed by osteomyelitis. There is abnormal inflammatory phlegmon and fluid extending down from the lateral Lisfranc joint to the cutaneous plantar surface of the foot, probably a draining sinus tract. The presence of the sinus tract favors this process being osteomyelitis rather than simply Charcot arthropathy. Potential smaller abscess below the medial cuneiform. 2. Lesser edema and enhancement in the navicular and calcaneus, possibly reactive but also possibly from early osteomyelitis. 3. Expected discontinuity of various tendons given the forefoot amputation. Tenosynovitis as noted above. 4. Small tibiotalar joint effusion with mild synovitis. Electronically Signed   By: Van Clines M.D.    On: 02/07/2016 08:58   Mr Foot Left W Wo Contrast  Result Date: 02/07/2016 CLINICAL DATA:  Chronic bilateral diabetic foot ulcers. Fever, foul-smelling urine, chills. EXAM: MRI OF THE LEFT FOREFOOT WITHOUT AND WITH CONTRAST TECHNIQUE: Multiplanar, multisequence MR imaging was performed both before and after administration of intravenous contrast. CONTRAST:  54mL MULTIHANCE GADOBENATE DIMEGLUMINE 529 MG/ML IV SOLN COMPARISON:  05/29/2015 FINDINGS: Bones/Joint/Cartilage Prior amputation of the first metatarsal and phalanx. Severe osteoarthritis of the second- third, fourth and fifth tarsometatarsal joints. Mild osteoarthritis of the navicular-cuneiform joint. Mild osteoarthritis of the calcaneocuboid joint. Mild osteoarthritis of the tibiotalar joint with partial thickness cartilage loss. Subchondral reactive marrow edema involving the medial corner of the talar dome. No fracture or dislocation.  No joint effusion. Soft tissue ulcer along the plantar aspect of the midfoot with surrounding soft tissue edema consistent with cellulitis. No drainable fluid collection or hematoma. Abnormality extends to the plantar aspect of the cuboid without cortical destruction or marrow edema to suggest osteomyelitis. Ligaments Collateral ligaments are intact. Muscles and Tendons Flexor, peroneal and extensor compartment tendons are intact. Achilles tendon is intact. Increased T2 signal throughout the plantar musculature likely neurogenic given the patient's history of diabetes. Soft tissue No fluid collection or hematoma.  No soft tissue mass. IMPRESSION: 1. Soft tissue ulcer and surrounding cellulitis along the plantar aspect of the midfoot. No evidence of acute osteomyelitis of the left forefoot. 2. Severe osteoarthritis of the second- third, fourth and fifth tarsometatarsal joints. Mild osteoarthritis of the navicular-cuneiform joint. Mild osteoarthritis of the calcaneocuboid joint. Mild osteoarthritis of the tibiotalar joint  with partial thickness cartilage loss. Subchondral reactive marrow edema involving the medial corner of the talar dome. Electronically Signed   By: Kathreen Devoid   On: 02/07/2016 08:52     Assessment/Plan: Sepsis MSSA bacteremia Chronic Right diabetic foot ucler DM2 with complications AKI IDA  Continue cefazolin (day 2) BCx MSSA Await repeat BCx Await TTE to rule out vegetation MRI: osteo and abscess of R foot. No osteo L foot.  Would have ortho see her        Bobby Rumpf Infectious Diseases (pager) [phone] [email or web address] 02/07/2016, 2:59 PM  LOS: 4 days

## 2016-02-07 NOTE — Consult Note (Addendum)
WOC follow-up: MRI indicates osteomyelitis to right foot. This complex medical condition is beyond [place] scope of practice, discussed results with primary team.  Please refer to ortho service for further plan of care and re-consult if further assistance is needed.  Thank-you,  Julien Girt MSN, [place], [place], [place], [place]

## 2016-02-07 NOTE — Progress Notes (Signed)
[facility]opaedic [facility]   Pt seen and evaluated with Dr. 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 However this ulcer has been present for 9 months        As this is not our area of orthopaedic expertise we will discuss with foot/ankle colleague   Pt agreeable to see Dr. Sharol Given again      Pt may also wish to have [facility] opinion [facility] a [facility]   Dana Pigg, PA-C [facility] [phone] (P) 02/07/2016 3:11 PM

## 2016-02-07 NOTE — Progress Notes (Signed)
PROGRESS NOTE                                                                                                                                                                                                             Patient Demographics:    Dana [name], is a 60 y.o. female, DOB - Sep 04, 1955, SHF:[id number]  Admit date - 02/03/2016   Admitting Physician Rise Patience, MD  Outpatient Primary MD for the patient is [name], PA-C  LOS - 4  Outpatient Specialists:  Chief Complaint  Patient presents with  . Emesis    chills, weakness       Brief Narrative  60 year old female wit history of insulin-dependent diabetes mellitus (on insulin pump), diabetic neuropathy, chronic diabetic foot ulcer status post recent debridement, iron deficiency anemia with malabsorption (receives when necessary iron infusion as outpatient), multiple UTIs, bipolar disorder who presented to [facility] with nausea and vomiting associated with fatigue and dysuria. She had 2 episode of vomiting on the day prior to admission associated with chills and fever of 101F. Patient was found to have UTI with mild leukocytosis and acute kidney injury. admitted to hospitalist service.   Subjective:   Pt has no new complaints reported to me.   Assessment  & Plan :    Principal Problem:   SIRS secondary to UTI (lower urinary tract infection) Blood cultures 2/2 growing MSSA. Pending sensitivity. ?urinary source. Urine culture unfortunately not sent on admission.Check 2-D echo to rule out vegetation.  Antibiotic managed by ID currently. Gentle IV hydration, supportive care with antiemetics.  Active Problems:   DM (diabetes mellitus) type II uncontrolled with eye manifestation ([place]) On metformin and insulin pump. (30 units basal, 6 units breakfast and 8 units each with lunch and dinner) Stable on basal insulin with sliding scale  coverage..    Bilateral diabetic foot ulcer associated with secondary diabetes mellitus ([place]) Recent debridement as outpatient. Has bilateral foot ulcers. Wound care consult appreciated. - Awaiting MRI.  - Ortho consulted. Please refer to their note. Will discuss with Dr. Sharol Given next am if not already done by Ainsley Spinner (PA)    Diabetic ulcer of toe [facility])  Wound care consulted and recommended ortho consult as well. Ortho consult obtained.   Acute kidney injury Secondary to dehydration. Mild. Improved with IV hydration  Dyslipidemia Continue statin  Bipolar disorder with depression Continue Lexapro  Code Status : Full code  Family Communication  : None at bedside  Disposition Plan  : Home pending final blood culture sensitivity, improvement in hemoglobin  Barriers For Discharge : Active symptoms  Consults  : None  Procedures  : None   DVT Prophylaxis  :  Lovenox -   Lab Results  Component Value Date   PLT 455 (H) 02/07/2016    Antibiotics  :    Anti-infectives    Start     Dose/Rate Route Frequency Ordered Stop   02/06/16 1400  ceFAZolin (ANCEF) IVPB 2g/100 mL premix     2 g 200 mL/hr over 30 Minutes Intravenous Every 8 hours 02/06/16 1030     02/04/16 2000  cefTRIAXone (ROCEPHIN) 1 g in dextrose 5 % 50 mL IVPB  Status:  Discontinued     1 g 100 mL/hr over 30 Minutes Intravenous Every 24 hours 02/03/16 2318 02/04/16 1708   02/04/16 1730  ceFAZolin (ANCEF) IVPB 1 g/50 mL premix  Status:  Discontinued     1 g 100 mL/hr over 30 Minutes Intravenous Every 8 hours 02/04/16 1708 02/06/16 1030   02/03/16 1930  cefTRIAXone (ROCEPHIN) 1 g in dextrose 5 % 50 mL IVPB     1 g 100 mL/hr over 30 Minutes Intravenous  Once 02/03/16 1923 02/03/16 2030        Objective:   Vitals:   02/07/16 0513 02/07/16 0800 02/07/16 0942 02/07/16 1619  BP: (!) 149/61  (!) 146/75 (!) 144/69  Pulse: 98 83 78 71  Resp: 18  18 (!) 23  Temp: 99.5 F (37.5 C)  100.2 F (37.9 C) 98.8 F (37.1  C)  TempSrc: Oral  Oral Oral  SpO2: 98%  97% 97%  Weight:      Height:        Wt Readings from Last 3 Encounters:  02/06/16 81.2 kg (179 lb 1.6 oz)  01/24/16 79 kg (174 lb 3.2 oz)  01/03/16 79 kg (174 lb 3.2 oz)     Intake/Output Summary (Last 24 hours) at 02/07/16 1628 Last data filed at 02/07/16 1500  Gross per 24 hour  Intake              870 ml  Output              675 ml  Net              195 ml     Physical Exam  Gen: not in distress, alert and awake HEENT: Pallor present, moist mucosa, supple neck Chest: clear b/l, no added sounds CVS: N S1&S2, no murmurs, rubs or gallop GI: soft, NT, ND, BS+ Musculoskeletal: warm, no edema, Bilateral foot ulcers, left foot ray amputation    Data Review:    CBC  Recent Labs Lab 02/03/16 1635 02/05/16 0553 02/05/16 2014 02/07/16 0244  WBC 12.5* 9.8  --  11.8*  HGB 7.1* 5.6* 6.4* 10.0*  HCT 21.7* 17.5* 19.6* 30.1*  PLT 503* 399  --  455*  MCV 89.3 90.2  --  88.5  MCH 29.2 28.9  --  29.1  MCHC 32.7 32.0  --  32.9  RDW 13.6 14.9  --  15.0  LYMPHSABS 0.8 2.0  --   --   MONOABS 0.8 1.0  --   --   EOSABS 0.0 0.2  --   --   BASOSABS 0.0 0.0  --   --  Chemistries   Recent Labs Lab 02/03/16 1635 02/05/16 0553  NA 132* 135  K 4.4 4.4  CL 100* 105  CO2 21* 23  GLUCOSE 257* 144*  BUN 45* 23*  CREATININE 1.32* 1.02*  CALCIUM 8.9 8.2*  AST 17  --   ALT 21  --   ALKPHOS 207*  --   BILITOT 1.0  --    ------------------------------------------------------------------------------------------------------------------ No results for input(s): CHOL, HDL, LDLCALC, TRIG, CHOLHDL, LDLDIRECT in the last 72 hours.  Lab Results  Component Value Date   HGBA1C 7.8 (H) 01/18/2016   ------------------------------------------------------------------------------------------------------------------ No results for input(s): TSH, T4TOTAL, T3FREE, THYROIDAB in the last 72 hours.  Invalid input(s):  FREET3 ------------------------------------------------------------------------------------------------------------------ No results for input(s): VITAMINB12, FOLATE, FERRITIN, TIBC, IRON, RETICCTPCT in the last 72 hours.  Coagulation profile No results for input(s): INR, PROTIME in the last 168 hours.  No results for input(s): DDIMER in the last 72 hours.  Cardiac Enzymes No results for input(s): CKMB, TROPONINI, MYOGLOBIN in the last 168 hours.  Invalid input(s): CK ------------------------------------------------------------------------------------------------------------------ No results found for: BNP  Inpatient Medications  Scheduled Meds: . aspirin  81 mg Oral Daily  . atorvastatin  20 mg Oral QHS  .  ceFAZolin (ANCEF) IV  2 g Intravenous Q8H  . Chlorhexidine Gluconate Cloth  6 each Topical Daily  . escitalopram  10 mg Oral Daily  . gabapentin  1,200 mg Oral TID  . insulin aspart  0-15 Units Subcutaneous TID WC  . insulin glargine  30 Units Subcutaneous QHS  . mupirocin ointment  1 application Nasal BID  . pantoprazole  40 mg Oral Daily   Continuous Infusions: . sodium chloride 75 mL/hr at 02/06/16 1442   PRN Meds:.acetaminophen, alum & mag hydroxide-simeth, guaiFENesin-dextromethorphan, ondansetron  Micro Results Recent Results (from the past 240 hour(s))  Culture, blood (routine x 2)     Status: Abnormal   Collection Time: 02/03/16  5:45 PM  Result Value Ref Range Status   Specimen Description BLOOD LEFT ANTECUBITAL  Final   Special Requests BOTTLES DRAWN AEROBIC AND ANAEROBIC 5CC  Final   Culture  Setup Time   Final    GRAM POSITIVE COCCI IN CLUSTERS IN BOTH AEROBIC AND ANAEROBIC BOTTLES CRITICAL RESULT CALLED TO, READ BACK BY AND VERIFIED WITH: M. BELL, PHARMD AT 1640 ON 02/04/16 BY C. JESSUP, MLT. Performed at [facility] (A)  Final   Report Status 02/06/2016 FINAL  Final   Organism ID, Bacteria STAPHYLOCOCCUS  AUREUS  Final      Susceptibility   Staphylococcus aureus - MIC*    CIPROFLOXACIN <=0.5 SENSITIVE Sensitive     ERYTHROMYCIN <=0.25 SENSITIVE Sensitive     GENTAMICIN <=0.5 SENSITIVE Sensitive     OXACILLIN <=0.25 SENSITIVE Sensitive     TETRACYCLINE <=1 SENSITIVE Sensitive     VANCOMYCIN 1 SENSITIVE Sensitive     TRIMETH/SULFA <=10 SENSITIVE Sensitive     CLINDAMYCIN <=0.25 SENSITIVE Sensitive     RIFAMPIN <=0.5 SENSITIVE Sensitive     Inducible Clindamycin NEGATIVE Sensitive     * STAPHYLOCOCCUS AUREUS  Blood Culture ID Panel (Reflexed)     Status: Abnormal   Collection Time: 02/03/16  5:45 PM  Result Value Ref Range Status   Enterococcus species NOT DETECTED NOT DETECTED Final   Listeria monocytogenes NOT DETECTED NOT DETECTED Final   Staphylococcus species DETECTED (A) NOT DETECTED Final    Comment: CRITICAL RESULT CALLED TO, READ BACK BY AND VERIFIED WITH: M. BELL,  PHARMD AT 1640 ON 02/04/16 BY C. JESSUP, MLT.    Staphylococcus aureus DETECTED (A) NOT DETECTED Final    Comment: CRITICAL RESULT CALLED TO, READ BACK BY AND VERIFIED WITH: M. BELL, PHARMD AT 1640 ON 02/04/16 BY C. JESSUP, MLT.    Methicillin resistance NOT DETECTED NOT DETECTED Final   Streptococcus species NOT DETECTED NOT DETECTED Final   Streptococcus agalactiae NOT DETECTED NOT DETECTED Final   Streptococcus pneumoniae NOT DETECTED NOT DETECTED Final   Streptococcus pyogenes NOT DETECTED NOT DETECTED Final   Acinetobacter baumannii NOT DETECTED NOT DETECTED Final   Enterobacteriaceae species NOT DETECTED NOT DETECTED Final   Enterobacter cloacae complex NOT DETECTED NOT DETECTED Final   Escherichia coli NOT DETECTED NOT DETECTED Final   Klebsiella oxytoca NOT DETECTED NOT DETECTED Final   Klebsiella pneumoniae NOT DETECTED NOT DETECTED Final   Proteus species NOT DETECTED NOT DETECTED Final   Serratia marcescens NOT DETECTED NOT DETECTED Final   Haemophilus influenzae NOT DETECTED NOT DETECTED Final    Neisseria meningitidis NOT DETECTED NOT DETECTED Final   Pseudomonas aeruginosa NOT DETECTED NOT DETECTED Final   Candida albicans NOT DETECTED NOT DETECTED Final   Candida glabrata NOT DETECTED NOT DETECTED Final   Candida krusei NOT DETECTED NOT DETECTED Final   Candida parapsilosis NOT DETECTED NOT DETECTED Final   Candida tropicalis NOT DETECTED NOT DETECTED Final    Comment: Performed at [facility]  Culture, blood (routine x 2)     Status: Abnormal   Collection Time: 02/03/16  6:15 PM  Result Value Ref Range Status   Specimen Description BLOOD RIGHT ARM  Final   Special Requests BOTTLES DRAWN AEROBIC AND ANAEROBIC 10CC  Final   Culture  Setup Time   Final    GRAM POSITIVE COCCI IN CLUSTERS IN BOTH AEROBIC AND ANAEROBIC BOTTLES CRITICAL VALUE NOTED.  VALUE IS CONSISTENT WITH PREVIOUSLY REPORTED AND CALLED VALUE.    Culture (A)  Final    STAPHYLOCOCCUS AUREUS SUSCEPTIBILITIES PERFORMED ON PREVIOUS CULTURE WITHIN THE LAST 5 DAYS. Performed at [facility]    Report Status 02/06/2016 FINAL  Final  Respiratory Panel by PCR     Status: None   Collection Time: 02/04/16  8:57 AM  Result Value Ref Range Status   Adenovirus NOT DETECTED NOT DETECTED Final   Coronavirus 229E NOT DETECTED NOT DETECTED Final   Coronavirus HKU1 NOT DETECTED NOT DETECTED Final   Coronavirus NL63 NOT DETECTED NOT DETECTED Final   Coronavirus OC43 NOT DETECTED NOT DETECTED Final   Metapneumovirus NOT DETECTED NOT DETECTED Final   Rhinovirus / Enterovirus NOT DETECTED NOT DETECTED Final   Influenza A NOT DETECTED NOT DETECTED Final   Influenza B NOT DETECTED NOT DETECTED Final   Parainfluenza Virus 1 NOT DETECTED NOT DETECTED Final   Parainfluenza Virus 2 NOT DETECTED NOT DETECTED Final   Parainfluenza Virus 3 NOT DETECTED NOT DETECTED Final   Parainfluenza Virus 4 NOT DETECTED NOT DETECTED Final   Respiratory Syncytial Virus NOT DETECTED NOT DETECTED Final   Bordetella pertussis NOT  DETECTED NOT DETECTED Final   Chlamydophila pneumoniae NOT DETECTED NOT DETECTED Final   Mycoplasma pneumoniae NOT DETECTED NOT DETECTED Final  Culture, blood (Routine X 2) w Reflex to ID Panel     Status: None (Preliminary result)   Collection Time: 02/05/16  8:11 PM  Result Value Ref Range Status   Specimen Description BLOOD LEFT HAND  Final   Special Requests IN PEDIATRIC BOTTLE 3CC  Final  Culture NO GROWTH 2 DAYS  Final   Report Status PENDING  Incomplete    Radiology Reports Dg Chest 2 View  Result Date: 02/03/2016 CLINICAL DATA:  Fever EXAM: CHEST  2 VIEW COMPARISON:  06/15/2015 chest radiograph. FINDINGS: Stable cardiomediastinal silhouette with top-normal heart size and aortic atherosclerosis. No pneumothorax. No pleural effusion. Right rotated frontal view. Hypo inspiratory lateral view. No pulmonary edema. Bibasilar lung opacities on the lateral view are favored to represent atelectasis given the absence of opacities on the better inflated frontal view. IMPRESSION: Bibasilar atelectasis.  Otherwise no active disease in the chest. Aortic atherosclerosis. Electronically Signed   By: Ilona Sorrel M.D.   On: 02/03/2016 16:32   Mr Foot Right W Wo Contrast  Result Date: 02/07/2016 CLINICAL DATA:  Diabetic foot ulcers with recent debridement. Diabetic neuropathy. Fever and chills. Draining ulcers of the foot. 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 EXAM: MRI OF THE RIGHT HINDFOOT WITHOUT AND WITH CONTRAST TECHNIQUE: Multiplanar, multisequence MR imaging of the hindfoot was performed before and after contrast was administered. CONTRAST:  15 cc MultiHance COMPARISON:  12/27/2015 radiographs and 08/25/2014 FINDINGS: TENDONS Peroneal: Extensive tenosynovitis. Distal peroneus brevis poorly seen, possibly detached. Peroneus longus becomes an apparent distal to the level of the calcaneus and is likewise likely not attached. Posteromedial: Tibialis  posterior and flexor hallucis longus tenosynovitis. The flexor hallucis longus appears to terminate in the medial soft tissues on image 18/2 without attaching the bone. FDL tendon terminates in the vicinity of the soft tissues overlying the metatarsals. Tibialis posterior tendinopathy distally. Anterior: Tibialis anterior tenosynovitis, image 10/6. Achilles: Unremarkable Plantar Fascia: Lax plantar fascia with thickening of the medial band proximally. Violation of the lateral band distally by fluid signal intensity. LIGAMENTS Lateral: Attenuated anterior talofibular ligament, query remote injury. Medial: Probable tear of the medioplantar oblique component of the spring ligament. CARTILAGE Ankle Joint: Small tibiotalar joint effusion with mild synovitis. Subtalar Joints/Sinus Tarsi: Mild synovitis with edema and enhancement in the sinus tarsi. Bones: Abnormal osseous edema and enhancement especially in the cuboid, cuneiform is a, and second through fourth metatarsal bases. On radiographs of 12/27/2015 there is a bony structure corresponding to the base of the fifth metatarsal; this bony structures no longer well appreciated at least by MRI, and instead there is abnormal inflammatory phlegmon and enhancement in this vicinity, extending down through the lateral band of the plantar fascia and to the plantar cutaneous surface were there is a bandage. I suspect that this is a draining infectious process. The fifth metatarsal base has probably been destroyed by this process. There is a less degree of edema and enhancement anteriorly in the calcaneus and in the navicular. Other: There is a 1.6 by 0.9 by 1.3 cm (volume = 1 cm^3) fluid collection with enhancing margins plantar to the medial cuneiform. Extensive cutaneous and subcutaneous edema along the remaining foot especially in the midfoot region. Small foci of low T1 and T2 signal in the soft tissues plantar to the cuneiform is a an Lisfranc joint as on images 20-22 of  series 4, probably small amounts of gas in the soft tissues, but possibly small metal fragments. IMPRESSION: 1. Osseous edema and enhancement compatible with osteomyelitis involving the cuneiform is, cuboid, and bases of the second through fourth metatarsals. I suspect the base of the fifth metatarsal has been intervally destroyed by osteomyelitis. There is abnormal inflammatory phlegmon and fluid extending down from the lateral Lisfranc joint to the cutaneous plantar  surface of the foot, probably a draining sinus tract. The presence of the sinus tract favors this process being osteomyelitis rather than simply Charcot arthropathy. Potential smaller abscess below the medial cuneiform. 2. Lesser edema and enhancement in the navicular and calcaneus, possibly reactive but also possibly from early osteomyelitis. 3. Expected discontinuity of various tendons given the forefoot amputation. Tenosynovitis as noted above. 4. Small tibiotalar joint effusion with mild synovitis. Electronically Signed   By: Van Clines M.D.   On: 02/07/2016 08:58   Mr Foot Left W Wo Contrast  Result Date: 02/07/2016 CLINICAL DATA:  Chronic bilateral diabetic foot ulcers. Fever, foul-smelling urine, chills. EXAM: MRI OF THE LEFT FOREFOOT WITHOUT AND WITH CONTRAST TECHNIQUE: Multiplanar, multisequence MR imaging was performed both before and after administration of intravenous contrast. CONTRAST:  31mL MULTIHANCE GADOBENATE DIMEGLUMINE 529 MG/ML IV SOLN COMPARISON:  05/29/2015 FINDINGS: Bones/Joint/Cartilage Prior amputation of the first metatarsal and phalanx. Severe osteoarthritis of the second- third, fourth and fifth tarsometatarsal joints. Mild osteoarthritis of the navicular-cuneiform joint. Mild osteoarthritis of the calcaneocuboid joint. Mild osteoarthritis of the tibiotalar joint with partial thickness cartilage loss. Subchondral reactive marrow edema involving the medial corner of the talar dome. No fracture or dislocation.  No  joint effusion. Soft tissue ulcer along the plantar aspect of the midfoot with surrounding soft tissue edema consistent with cellulitis. No drainable fluid collection or hematoma. Abnormality extends to the plantar aspect of the cuboid without cortical destruction or marrow edema to suggest osteomyelitis. Ligaments Collateral ligaments are intact. Muscles and Tendons Flexor, peroneal and extensor compartment tendons are intact. Achilles tendon is intact. Increased T2 signal throughout the plantar musculature likely neurogenic given the patient's history of diabetes. Soft tissue No fluid collection or hematoma.  No soft tissue mass. IMPRESSION: 1. Soft tissue ulcer and surrounding cellulitis along the plantar aspect of the midfoot. No evidence of acute osteomyelitis of the left forefoot. 2. Severe osteoarthritis of the second- third, fourth and fifth tarsometatarsal joints. Mild osteoarthritis of the navicular-cuneiform joint. Mild osteoarthritis of the calcaneocuboid joint. Mild osteoarthritis of the tibiotalar joint with partial thickness cartilage loss. Subchondral reactive marrow edema involving the medial corner of the talar dome. Electronically Signed   By: Kathreen Devoid   On: 02/07/2016 08:52    Time Spent in minutes  25 minutes   Velvet Bathe M.D on 02/07/2016 at 4:28 PM  Between 7am to 7pm - Pager - [phone]  After 7pm go to [email or web address] - password [facility]  [facility] -  Office  [phone]

## 2016-02-08 ENCOUNTER — Inpatient Hospital Stay (HOSPITAL_COMMUNITY): Payer: PPO

## 2016-02-08 ENCOUNTER — Encounter: Payer: PPO | Admitting: Physical Medicine & Rehabilitation

## 2016-02-08 DIAGNOSIS — Z23 Encounter for immunization: Secondary | ICD-10-CM | POA: Diagnosis not present

## 2016-02-08 DIAGNOSIS — A419 Sepsis, unspecified organism: Secondary | ICD-10-CM | POA: Diagnosis not present

## 2016-02-08 DIAGNOSIS — L97519 Non-pressure chronic ulcer of other part of right foot with unspecified severity: Secondary | ICD-10-CM | POA: Diagnosis not present

## 2016-02-08 DIAGNOSIS — R7881 Bacteremia: Secondary | ICD-10-CM | POA: Diagnosis not present

## 2016-02-08 DIAGNOSIS — A4101 Sepsis due to Methicillin susceptible Staphylococcus aureus: Secondary | ICD-10-CM | POA: Diagnosis not present

## 2016-02-08 DIAGNOSIS — M86471 Chronic osteomyelitis with draining sinus, right ankle and foot: Secondary | ICD-10-CM | POA: Diagnosis not present

## 2016-02-08 DIAGNOSIS — M86171 Other acute osteomyelitis, right ankle and foot: Secondary | ICD-10-CM | POA: Diagnosis not present

## 2016-02-08 DIAGNOSIS — L97529 Non-pressure chronic ulcer of other part of left foot with unspecified severity: Secondary | ICD-10-CM | POA: Diagnosis not present

## 2016-02-08 DIAGNOSIS — L02611 Cutaneous abscess of right foot: Secondary | ICD-10-CM | POA: Diagnosis not present

## 2016-02-08 DIAGNOSIS — N390 Urinary tract infection, site not specified: Secondary | ICD-10-CM | POA: Diagnosis not present

## 2016-02-08 DIAGNOSIS — E11621 Type 2 diabetes mellitus with foot ulcer: Secondary | ICD-10-CM | POA: Diagnosis not present

## 2016-02-08 DIAGNOSIS — B9561 Methicillin susceptible Staphylococcus aureus infection as the cause of diseases classified elsewhere: Secondary | ICD-10-CM | POA: Diagnosis not present

## 2016-02-08 DIAGNOSIS — E10621 Type 1 diabetes mellitus with foot ulcer: Secondary | ICD-10-CM | POA: Diagnosis not present

## 2016-02-08 LAB — ECHOCARDIOGRAM COMPLETE
Height: 61 in
Weight: 2859.2 oz

## 2016-02-08 LAB — GLUCOSE, CAPILLARY
GLUCOSE-CAPILLARY: 91 mg/dL (ref 65–99)
Glucose-Capillary: 111 mg/dL — ABNORMAL HIGH (ref 65–99)

## 2016-02-08 NOTE — Progress Notes (Signed)
Patient signed out Against Medical Advice. Patient refusing recommendations by healthcare team.  IV's x 2 removed. Telemetry removed. Patient dressed and belongings with patient. Patients spouse picked up patient.   Sheliah Plane RN

## 2016-02-08 NOTE — Progress Notes (Signed)
Physical Therapy Note Patient leaving hospital prior to PT evaluation being completed. Carita Pian Sanjuana Kava, [place] Pager [phone]

## 2016-02-08 NOTE — Discharge Summary (Signed)
Pt requested to leave AMA as she wanted to go to a different hospital to seek a second opinion. I offered her a picc line so she could continue to take antibiotics but she refused.  Gen: Pt in nad, alert and awake CV: no cyanosis Pulm: no increased wob, no wheezes  Dana [name]

## 2016-02-08 NOTE — Progress Notes (Signed)
[facility]   Pt refusing any surgical procedures today Wanting second opinion  We still recommend R AKA and Local debridement and wound care to L foot  Results for [place], Ellieanna B (MRN [id number]) as of 02/08/2016 13:16  Ref. Range 02/07/2016 16:40  CRP Latest Ref Range: <1.0 mg/dL 17.7 (H)  Results for [place], Angalina B (MRN [id number]) as of 02/08/2016 13:16  Ref. Range 02/07/2016 16:40  Sed Rate Latest Ref Range: 0 - 22 mm/hr 130 (H)   CBG (last 3)   Recent Labs  02/07/16 2116 02/08/16 0742 02/08/16 1112  GLUCAP 115* 91 111*      Pt can [facility] additional [facility] at a [facility] if she so desires but would anticipate the same recommendations   If pt declines intervention this admission she will need PICC for lV abx given R foot osteomyelitis   Have communicated with Dr. Sharol Given and he will also provide his recommendations  Jari Pigg, PA-C [facility] [phone] (P) 02/08/2016 1:14 PM

## 2016-02-08 NOTE — Progress Notes (Signed)
Echocardiogram 2D Echocardiogram has been performed.  Aggie Cosier 02/08/2016, 9:13 AM

## 2016-02-08 NOTE — Progress Notes (Signed)
INFECTIOUS DISEASE PROGRESS NOTE  ID: Dana [name] is a 60 y.o. 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 (HCC)   Hyperlipidemia LDL goal <100   Diabetic ulcer of toe (HCC)   Erythropoietin deficiency anemia   Hiatal hernia   Bilateral diabetic foot ulcer associated with secondary diabetes mellitus ([place])   Mild bibasilar atelectasis  Subjective: Pt leaving floor, "i'm going home"  Abtx:  Anti-infectives    Start     Dose/Rate Route Frequency Ordered Stop   02/06/16 1400  ceFAZolin (ANCEF) IVPB 2g/100 mL premix     2 g 200 mL/hr over 30 Minutes Intravenous Every 8 hours 02/06/16 1030     02/04/16 2000  cefTRIAXone (ROCEPHIN) 1 g in dextrose 5 % 50 mL IVPB  Status:  Discontinued     1 g 100 mL/hr over 30 Minutes Intravenous Every 24 hours 02/03/16 2318 02/04/16 1708   02/04/16 1730  ceFAZolin (ANCEF) IVPB 1 g/50 mL premix  Status:  Discontinued     1 g 100 mL/hr over 30 Minutes Intravenous Every 8 hours 02/04/16 1708 02/06/16 1030   02/03/16 1930  cefTRIAXone (ROCEPHIN) 1 g in dextrose 5 % 50 mL IVPB     1 g 100 mL/hr over 30 Minutes Intravenous  Once 02/03/16 1923 02/03/16 2030      Medications:  Scheduled: . aspirin  81 mg Oral Daily  . atorvastatin  20 mg Oral QHS  .  ceFAZolin (ANCEF) IV  2 g Intravenous Q8H  . Chlorhexidine Gluconate Cloth  6 each Topical Daily  . escitalopram  10 mg Oral Daily  . gabapentin  1,200 mg Oral TID  . insulin aspart  0-15 Units Subcutaneous TID WC  . insulin glargine  30 Units Subcutaneous QHS  . mupirocin ointment  1 application Nasal BID  . pantoprazole  40 mg Oral Daily    Objective: Vital signs in last 24 hours: Temp:  [98.8 F (37.1 C)-99.7 F (37.6 C)] 99.4 F (37.4 C) (09/29 0824) Pulse Rate:  [71-85] 74 (09/29 0824) Resp:  [18-23] 18 (09/29 0824) BP: (124-144)/(66-89) 140/66  (09/29 0824) SpO2:  [96 %-97 %] 97 % (09/29 0824) Weight:  [81.1 kg (178 lb 11.2 oz)] 81.1 kg (178 lb 11.2 oz) (09/28 2109)   General appearance: alert, cooperative, no distress and feet wrapped.   Lab Results  Recent Labs  02/05/16 2014 02/07/16 0244 02/07/16 1640  WBC  --  11.8*  --   HGB 6.4* 10.0*  --   HCT 19.6* 30.1*  --   NA  --   --  133*  K  --   --  4.1  CL  --   --  100*  CO2  --   --  23  BUN  --   --  13  CREATININE  --   --  0.92   Liver Panel  Recent Labs  02/07/16 1640  ALBUMIN 1.8*   Sedimentation Rate  Recent Labs  02/07/16 1640  ESRSEDRATE 130*   C-Reactive Protein  Recent Labs  02/07/16 1640  CRP 17.7*    Microbiology: Recent Results (from the past 240 hour(s))  Culture, blood (routine x 2)     Status: Abnormal   Collection Time: 02/03/16  5:45 PM  Result Value Ref Range Status   Specimen Description BLOOD LEFT ANTECUBITAL  Final  Special Requests BOTTLES DRAWN AEROBIC AND ANAEROBIC 5CC  Final   Culture  Setup Time   Final    GRAM POSITIVE COCCI IN CLUSTERS IN BOTH AEROBIC AND ANAEROBIC BOTTLES CRITICAL RESULT CALLED TO, READ BACK BY AND VERIFIED WITH: M. BELL, PHARMD AT 1640 ON 02/04/16 BY C. JESSUP, MLT. Performed at [facility] (A)  Final   Report Status 02/06/2016 FINAL  Final   Organism ID, Bacteria STAPHYLOCOCCUS AUREUS  Final      Susceptibility   Staphylococcus aureus - MIC*    CIPROFLOXACIN <=0.5 SENSITIVE Sensitive     ERYTHROMYCIN <=0.25 SENSITIVE Sensitive     GENTAMICIN <=0.5 SENSITIVE Sensitive     OXACILLIN <=0.25 SENSITIVE Sensitive     TETRACYCLINE <=1 SENSITIVE Sensitive     VANCOMYCIN 1 SENSITIVE Sensitive     TRIMETH/SULFA <=10 SENSITIVE Sensitive     CLINDAMYCIN <=0.25 SENSITIVE Sensitive     RIFAMPIN <=0.5 SENSITIVE Sensitive     Inducible Clindamycin NEGATIVE Sensitive     * STAPHYLOCOCCUS AUREUS  Blood Culture ID Panel (Reflexed)     Status: Abnormal    Collection Time: 02/03/16  5:45 PM  Result Value Ref Range Status   Enterococcus species NOT DETECTED NOT DETECTED Final   Listeria monocytogenes NOT DETECTED NOT DETECTED Final   Staphylococcus species DETECTED (A) NOT DETECTED Final    Comment: CRITICAL RESULT CALLED TO, READ BACK BY AND VERIFIED WITH: M. BELL, PHARMD AT 1640 ON 02/04/16 BY C. JESSUP, MLT.    Staphylococcus aureus DETECTED (A) NOT DETECTED Final    Comment: CRITICAL RESULT CALLED TO, READ BACK BY AND VERIFIED WITH: M. BELL, PHARMD AT 1640 ON 02/04/16 BY C. JESSUP, MLT.    Methicillin resistance NOT DETECTED NOT DETECTED Final   Streptococcus species NOT DETECTED NOT DETECTED Final   Streptococcus agalactiae NOT DETECTED NOT DETECTED Final   Streptococcus pneumoniae NOT DETECTED NOT DETECTED Final   Streptococcus pyogenes NOT DETECTED NOT DETECTED Final   Acinetobacter baumannii NOT DETECTED NOT DETECTED Final   Enterobacteriaceae species NOT DETECTED NOT DETECTED Final   Enterobacter cloacae complex NOT DETECTED NOT DETECTED Final   Escherichia coli NOT DETECTED NOT DETECTED Final   Klebsiella oxytoca NOT DETECTED NOT DETECTED Final   Klebsiella pneumoniae NOT DETECTED NOT DETECTED Final   Proteus species NOT DETECTED NOT DETECTED Final   Serratia marcescens NOT DETECTED NOT DETECTED Final   Haemophilus influenzae NOT DETECTED NOT DETECTED Final   Neisseria meningitidis NOT DETECTED NOT DETECTED Final   Pseudomonas aeruginosa NOT DETECTED NOT DETECTED Final   Candida albicans NOT DETECTED NOT DETECTED Final   Candida glabrata NOT DETECTED NOT DETECTED Final   Candida krusei NOT DETECTED NOT DETECTED Final   Candida parapsilosis NOT DETECTED NOT DETECTED Final   Candida tropicalis NOT DETECTED NOT DETECTED Final    Comment: Performed at [facility]  Culture, blood (routine x 2)     Status: Abnormal   Collection Time: 02/03/16  6:15 PM  Result Value Ref Range Status   Specimen Description BLOOD RIGHT ARM   Final   Special Requests BOTTLES DRAWN AEROBIC AND ANAEROBIC 10CC  Final   Culture  Setup Time   Final    GRAM POSITIVE COCCI IN CLUSTERS IN BOTH AEROBIC AND ANAEROBIC BOTTLES CRITICAL VALUE NOTED.  VALUE IS CONSISTENT WITH PREVIOUSLY REPORTED AND CALLED VALUE.    Culture (A)  Final    STAPHYLOCOCCUS AUREUS SUSCEPTIBILITIES PERFORMED ON PREVIOUS CULTURE WITHIN THE  LAST 5 DAYS. Performed at [facility]    Report Status 02/06/2016 FINAL  Final  Respiratory Panel by PCR     Status: None   Collection Time: 02/04/16  8:57 AM  Result Value Ref Range Status   Adenovirus NOT DETECTED NOT DETECTED Final   Coronavirus 229E NOT DETECTED NOT DETECTED Final   Coronavirus HKU1 NOT DETECTED NOT DETECTED Final   Coronavirus NL63 NOT DETECTED NOT DETECTED Final   Coronavirus OC43 NOT DETECTED NOT DETECTED Final   Metapneumovirus NOT DETECTED NOT DETECTED Final   Rhinovirus / Enterovirus NOT DETECTED NOT DETECTED Final   Influenza A NOT DETECTED NOT DETECTED Final   Influenza B NOT DETECTED NOT DETECTED Final   Parainfluenza Virus 1 NOT DETECTED NOT DETECTED Final   Parainfluenza Virus 2 NOT DETECTED NOT DETECTED Final   Parainfluenza Virus 3 NOT DETECTED NOT DETECTED Final   Parainfluenza Virus 4 NOT DETECTED NOT DETECTED Final   Respiratory Syncytial Virus NOT DETECTED NOT DETECTED Final   Bordetella pertussis NOT DETECTED NOT DETECTED Final   Chlamydophila pneumoniae NOT DETECTED NOT DETECTED Final   Mycoplasma pneumoniae NOT DETECTED NOT DETECTED Final  Culture, blood (Routine X 2) w Reflex to ID Panel     Status: None (Preliminary result)   Collection Time: 02/05/16  8:11 PM  Result Value Ref Range Status   Specimen Description BLOOD LEFT HAND  Final   Special Requests IN PEDIATRIC BOTTLE 3CC  Final   Culture NO GROWTH 2 DAYS  Final   Report Status PENDING  Incomplete    Studies/Results: Mr Foot Right W Wo Contrast  Result Date: 02/07/2016 CLINICAL DATA:  Diabetic foot ulcers  with recent debridement. Diabetic neuropathy. Fever and chills. Draining ulcers of the foot. 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 EXAM: MRI OF THE RIGHT HINDFOOT WITHOUT AND WITH CONTRAST TECHNIQUE: Multiplanar, multisequence MR imaging of the hindfoot was performed before and after contrast was administered. CONTRAST:  15 cc MultiHance COMPARISON:  12/27/2015 radiographs and 08/25/2014 FINDINGS: TENDONS Peroneal: Extensive tenosynovitis. Distal peroneus brevis poorly seen, possibly detached. Peroneus longus becomes an apparent distal to the level of the calcaneus and is likewise likely not attached. Posteromedial: Tibialis posterior and flexor hallucis longus tenosynovitis. The flexor hallucis longus appears to terminate in the medial soft tissues on image 18/2 without attaching the bone. FDL tendon terminates in the vicinity of the soft tissues overlying the metatarsals. Tibialis posterior tendinopathy distally. Anterior: Tibialis anterior tenosynovitis, image 10/6. Achilles: Unremarkable Plantar Fascia: Lax plantar fascia with thickening of the medial band proximally. Violation of the lateral band distally by fluid signal intensity. LIGAMENTS Lateral: Attenuated anterior talofibular ligament, query remote injury. Medial: Probable tear of the medioplantar oblique component of the spring ligament. CARTILAGE Ankle Joint: Small tibiotalar joint effusion with mild synovitis. Subtalar Joints/Sinus Tarsi: Mild synovitis with edema and enhancement in the sinus tarsi. Bones: Abnormal osseous edema and enhancement especially in the cuboid, cuneiform is a, and second through fourth metatarsal bases. On radiographs of 12/27/2015 there is a bony structure corresponding to the base of the fifth metatarsal; this bony structures no longer well appreciated at least by MRI, and instead there is abnormal inflammatory phlegmon and enhancement in this vicinity, extending down through  the lateral band of the plantar fascia and to the plantar cutaneous surface were there is a bandage. I suspect that this is a draining infectious process. The fifth metatarsal base has probably been destroyed by this process.  There is a less degree of edema and enhancement anteriorly in the calcaneus and in the navicular. Other: There is a 1.6 by 0.9 by 1.3 cm (volume = 1 cm^3) fluid collection with enhancing margins plantar to the medial cuneiform. Extensive cutaneous and subcutaneous edema along the remaining foot especially in the midfoot region. Small foci of low T1 and T2 signal in the soft tissues plantar to the cuneiform is a an Lisfranc joint as on images 20-22 of series 4, probably small amounts of gas in the soft tissues, but possibly small metal fragments. IMPRESSION: 1. Osseous edema and enhancement compatible with osteomyelitis involving the cuneiform is, cuboid, and bases of the second through fourth metatarsals. I suspect the base of the fifth metatarsal has been intervally destroyed by osteomyelitis. There is abnormal inflammatory phlegmon and fluid extending down from the lateral Lisfranc joint to the cutaneous plantar surface of the foot, probably a draining sinus tract. The presence of the sinus tract favors this process being osteomyelitis rather than simply Charcot arthropathy. Potential smaller abscess below the medial cuneiform. 2. Lesser edema and enhancement in the navicular and calcaneus, possibly reactive but also possibly from early osteomyelitis. 3. Expected discontinuity of various tendons given the forefoot amputation. Tenosynovitis as noted above. 4. Small tibiotalar joint effusion with mild synovitis. Electronically Signed   By: Van Clines M.D.   On: 02/07/2016 08:58   Mr Foot Left W Wo Contrast  Result Date: 02/07/2016 CLINICAL DATA:  Chronic bilateral diabetic foot ulcers. Fever, foul-smelling urine, chills. EXAM: MRI OF THE LEFT FOREFOOT WITHOUT AND WITH CONTRAST  TECHNIQUE: Multiplanar, multisequence MR imaging was performed both before and after administration of intravenous contrast. CONTRAST:  12mL MULTIHANCE GADOBENATE DIMEGLUMINE 529 MG/ML IV SOLN COMPARISON:  05/29/2015 FINDINGS: Bones/Joint/Cartilage Prior amputation of the first metatarsal and phalanx. Severe osteoarthritis of the second- third, fourth and fifth tarsometatarsal joints. Mild osteoarthritis of the navicular-cuneiform joint. Mild osteoarthritis of the calcaneocuboid joint. Mild osteoarthritis of the tibiotalar joint with partial thickness cartilage loss. Subchondral reactive marrow edema involving the medial corner of the talar dome. No fracture or dislocation.  No joint effusion. Soft tissue ulcer along the plantar aspect of the midfoot with surrounding soft tissue edema consistent with cellulitis. No drainable fluid collection or hematoma. Abnormality extends to the plantar aspect of the cuboid without cortical destruction or marrow edema to suggest osteomyelitis. Ligaments Collateral ligaments are intact. Muscles and Tendons Flexor, peroneal and extensor compartment tendons are intact. Achilles tendon is intact. Increased T2 signal throughout the plantar musculature likely neurogenic given the patient's history of diabetes. Soft tissue No fluid collection or hematoma.  No soft tissue mass. IMPRESSION: 1. Soft tissue ulcer and surrounding cellulitis along the plantar aspect of the midfoot. No evidence of acute osteomyelitis of the left forefoot. 2. Severe osteoarthritis of the second- third, fourth and fifth tarsometatarsal joints. Mild osteoarthritis of the navicular-cuneiform joint. Mild osteoarthritis of the calcaneocuboid joint. Mild osteoarthritis of the tibiotalar joint with partial thickness cartilage loss. Subchondral reactive marrow edema involving the medial corner of the talar dome. Electronically Signed   By: Kathreen Devoid   On: 02/07/2016 08:52     Assessment/Plan: Sepsis MSSA  bacteremia Chronic Right diabetic foot ucler DM2 with complications AKI  Pt leaving ama.  Wants second opinion for her proposed R aka  I am very concerned that she has not gotten a complete course of rx for her MSSA bacteremia.  Will call her house (left message) and called in rx Dispensing optician  [phone]) for po keflex 1 g bid for 1 month TTE neg for veg          Bobby Rumpf Infectious Diseases (pager) [phone] [email or web address] 02/08/2016, 3:04 PM  LOS: 5 days

## 2016-02-08 NOTE — Care Management Important Message (Deleted)
Important Message  Patient Details  Name: Dana [name] MRN: [id number] Date of Birth: 01-24-1956   Medicare Important Message Given:  Other (see comment)    Orbie Pyo 02/08/2016, 11:55 AM

## 2016-02-08 NOTE — Progress Notes (Signed)
Patient refused the paper work

## 2016-02-10 LAB — CULTURE, BLOOD (ROUTINE X 2): CULTURE: NO GROWTH

## 2016-02-10 LAB — HM MAMMOGRAPHY: HM MAMMO: NORMAL (ref 0–4)

## 2016-02-11 ENCOUNTER — Ambulatory Visit: Payer: PPO | Admitting: Physician Assistant

## 2016-02-11 DIAGNOSIS — L97425 Non-pressure chronic ulcer of left heel and midfoot with muscle involvement without evidence of necrosis: Secondary | ICD-10-CM | POA: Diagnosis not present

## 2016-02-11 DIAGNOSIS — M86272 Subacute osteomyelitis, left ankle and foot: Secondary | ICD-10-CM | POA: Diagnosis not present

## 2016-02-11 DIAGNOSIS — L97522 Non-pressure chronic ulcer of other part of left foot with fat layer exposed: Secondary | ICD-10-CM | POA: Diagnosis not present

## 2016-02-11 DIAGNOSIS — L97415 Non-pressure chronic ulcer of right heel and midfoot with muscle involvement without evidence of necrosis: Secondary | ICD-10-CM | POA: Diagnosis not present

## 2016-02-11 DIAGNOSIS — L97512 Non-pressure chronic ulcer of other part of right foot with fat layer exposed: Secondary | ICD-10-CM | POA: Diagnosis not present

## 2016-02-11 DIAGNOSIS — E11621 Type 2 diabetes mellitus with foot ulcer: Secondary | ICD-10-CM | POA: Diagnosis not present

## 2016-02-12 ENCOUNTER — Encounter: Payer: Self-pay | Admitting: Physician Assistant

## 2016-02-12 ENCOUNTER — Ambulatory Visit (INDEPENDENT_AMBULATORY_CARE_PROVIDER_SITE_OTHER): Payer: PPO | Admitting: Physician Assistant

## 2016-02-12 VITALS — BP 106/68 | HR 71 | Temp 97.5°F | Resp 16 | Ht 61.0 in | Wt 170.4 lb

## 2016-02-12 DIAGNOSIS — N3001 Acute cystitis with hematuria: Secondary | ICD-10-CM | POA: Diagnosis not present

## 2016-02-12 DIAGNOSIS — A4101 Sepsis due to Methicillin susceptible Staphylococcus aureus: Secondary | ICD-10-CM

## 2016-02-12 DIAGNOSIS — N189 Chronic kidney disease, unspecified: Secondary | ICD-10-CM

## 2016-02-12 DIAGNOSIS — E1169 Type 2 diabetes mellitus with other specified complication: Secondary | ICD-10-CM

## 2016-02-12 DIAGNOSIS — M869 Osteomyelitis, unspecified: Secondary | ICD-10-CM

## 2016-02-12 DIAGNOSIS — D631 Anemia in chronic kidney disease: Secondary | ICD-10-CM

## 2016-02-12 DIAGNOSIS — R399 Unspecified symptoms and signs involving the genitourinary system: Secondary | ICD-10-CM | POA: Diagnosis not present

## 2016-02-12 LAB — CBC
HEMATOCRIT: 33.7 % — AB (ref 36.0–46.0)
Hemoglobin: 11.2 g/dL — ABNORMAL LOW (ref 12.0–15.0)
MCHC: 33.3 g/dL (ref 30.0–36.0)
MCV: 89.1 fl (ref 78.0–100.0)
Platelets: 686 10*3/uL — ABNORMAL HIGH (ref 150.0–400.0)
RBC: 3.78 Mil/uL — ABNORMAL LOW (ref 3.87–5.11)
RDW: 14.8 % (ref 11.5–15.5)
WBC: 13.9 10*3/uL — AB (ref 4.0–10.5)

## 2016-02-12 LAB — COMPREHENSIVE METABOLIC PANEL
ALBUMIN: 2.8 g/dL — AB (ref 3.5–5.2)
ALT: 11 U/L (ref 0–35)
AST: 14 U/L (ref 0–37)
Alkaline Phosphatase: 174 U/L — ABNORMAL HIGH (ref 39–117)
BILIRUBIN TOTAL: 0.4 mg/dL (ref 0.2–1.2)
BUN: 37 mg/dL — AB (ref 6–23)
CALCIUM: 9.5 mg/dL (ref 8.4–10.5)
CO2: 30 meq/L (ref 19–32)
CREATININE: 1 mg/dL (ref 0.40–1.20)
Chloride: 99 mEq/L (ref 96–112)
GFR: 60.3 mL/min (ref >60.00)
Glucose, Bld: 123 mg/dL — ABNORMAL HIGH (ref 70–99)
Potassium: 5.6 mEq/L — ABNORMAL HIGH (ref 3.5–5.1)
SODIUM: 137 meq/L (ref 135–145)
Total Protein: 7.7 g/dL (ref 6.0–8.3)

## 2016-02-12 LAB — POCT URINALYSIS DIPSTICK
Bilirubin, UA: NEGATIVE
GLUCOSE UA: NEGATIVE
KETONES UA: POSITIVE
Leukocytes, UA: NEGATIVE
Nitrite, UA: NEGATIVE
Protein, UA: POSITIVE
UROBILINOGEN UA: 0.2
pH, UA: 5

## 2016-02-12 NOTE — Progress Notes (Signed)
Patient presents to clinic today for hospital follow-up. Patient presented to ER on 02/03/16 with c/o significant nausea and vomiting with fatigue and fever. ER workup included UA which revealed pyuria and bacteruria, leukocytosis with WBC at 12.5 and decreased hemoglobin. CXR revealed bibasilar atelectasis. Patient given IV NS bolus, antiemetics and started on IV Rocephin. Patient was subsequently admitted to the hospital for further assessment and treatment. In Hospital, urine culture obtained but never sent per what I can see on record review, blood culture obtained revealing staph aureus that was pan-sensitive. MD unsure of cause of bacteremia -- UTI versus other etiology. 2-D echo performed and negative for vegetation. MRI R foot/ankle performed revealing abscess with sinus tracking and signs of osteomyelitis. Patient witched to IV Ancef. During hospitalization, H/H dropped considerably and patient was transfused with pRBCs. H/H stabilized. Orthopedics was consulted and recommended BKA of R extremity. Patient declines and requested 2nd opinion, for which they were going to set up outpatient after hospitalization. Patient was upset with care and signed herself out AMA. Provider requested patient reconsider or at least allow a PICC line placement before leaving so that she could receive the appropriate course of ABX. Patient refused and left AMA. Was given 1 month course of Keflex 1 g BID.   Since discharge, patient endorses feeling well. Denies fever, chills, malaise or fatigue. Denies dysuria, urinary urgency or frequency. Denies hematuria, nausea or vomiting. Denies any chest pain or SOB. Denies any redness of foot. Denies tenderness but typically does not have 2/2 to significant diabetic neuropathy. Endorses taking Keflex BID as directed. Had follow-up with her Wound specialist yesterday who stated that her wounds and foot looked much improved from previous visits. Denies any mention from them regarding  need for further assessment for the presumed osteomyelitis. Is trying to set up a second opinion with Orthopedics here in [place] regarding need for BKA.  Patient states she refused PICC line because she felt that the providers at the hospital would not listen to her regarding her request for second opinion for her extremity. States she felt talked down to and that no one took the time to sit down and explain her options and what was going on.   Past Medical History:  Diagnosis Date  . Bipolar 1 disorder ([place])   . Cellulitis and abscess of foot 03/08/2015  . Diabetes mellitus    INSULIN DEPENDENT  . Diabetic neuropathy ([place])   . Diabetic neuropathy ([place])   . 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 H/O hiatal hernia   . Hyperlipidemia   . Hypertension    past hx of  . Iron malabsorption 09/26/2015  . Numbness and tingling    Hx; of in B/LLE and B/LUE  . Other iron deficiency anemias 09/20/2015    Current Outpatient Prescriptions on File Prior to Visit  Medication Sig Dispense Refill  . aspirin 81 MG tablet Take 81 mg by mouth daily.    Marland Kitchen atorvastatin (LIPITOR) 20 MG tablet Take 1 tablet (20 mg total) by mouth at bedtime. 30 tablet 5  . Blood Glucose Monitoring Suppl (FREESTYLE LITE) DEVI Use to check blood sugar 3 times a day dx code E11.39 1 each 0  . escitalopram (LEXAPRO) 10 MG tablet Take 10 mg by mouth daily.     Marland Kitchen gabapentin (NEURONTIN) 600 MG tablet Take 1,200 mg by mouth 3 (three) times daily.     Marland Kitchen glucose blood (FREESTYLE LITE) test strip Use as instructed to check blood sugar 3 times  a day dx code E11.39 100 each 3  . ibuprofen (ADVIL,MOTRIN) 600 MG tablet Take 600 mg by mouth every 8 (eight) hours as needed for headache or moderate pain.     . Insulin Disposable Pump (V-GO 30) KIT Use one per day 1 kit 2  . insulin regular (NOVOLIN R RELION) 100 units/mL injection Take 6 units with breakfast, 8 units with lunch and 8 units with dinner (Patient taking differently: Inject  4-10 Units into the skin 3 (three) times daily before meals. Per meal size, small meals = less pumps to Vgo and large meal = more pumps to Vgo. 1 pump = 2 units.) 10 mL 3  . INSULIN SYRINGE 1CC/29G 29G X 1/2" 1 ML MISC Use 1 per day. 100 each 2  . Lancets (FREESTYLE) lancets Use as instructed to check blood sugar 3 times per day dx code E11.39 100 each 3  . metFORMIN (GLUCOPHAGE) 1000 MG tablet 1 bid (Patient taking differently: Take 500-1,000 mg by mouth See admin instructions. Take 1000 mg by mouth in the morning and take 500 mg by mouth in the evening.) 180 tablet 5  . OVER THE COUNTER MEDICATION Take 2 each by mouth daily. Centrum-Muli. Vitamin Gummy     No current facility-administered medications on file prior to visit.     Allergies  Allergen Reactions  . Lithium Nausea And Vomiting and Other (See Comments)    Can not keep this medication down. It makes her terribly ill.  . Ativan [Lorazepam] Other (See Comments)    Abnormal behavior  . Demerol [Meperidine] Other (See Comments)    Abnormal behavior  . Oxycodone Other (See Comments)    Abnormal behavior  . Darvocet [Propoxyphene N-Acetaminophen] Itching  . Latex Itching    Family History  Problem Relation Age of Onset  . Diabetes Mother   . Mental illness Mother   . Bipolar disorder Mother   . Hypertension Father   . Diabetes Maternal Grandmother   . Heart disease Neg Hx     Social History   Social History  . Marital status: Married    Spouse name: N/A  . Number of children: 2  . Years of education: 14   Occupational History  .      disabled   Social History Main Topics  . Smoking status: Never Smoker  . Smokeless tobacco: Never Used  . Alcohol use No  . Drug use: No  . Sexual activity: No   Other Topics Concern  . None   Social History Narrative   Lives with husband in home   Caffeine use - Coke 2 or 3 16 oz daily   Review of Systems - See HPI.  All other ROS are negative.  BP 106/68 (BP Location:  Right Arm, Patient Position: Sitting, Cuff Size: Large)   Pulse 71   Temp 97.5 F (36.4 C) (Oral)   Resp 16   Ht '5\' 1"'  (1.549 m)   Wt 170 lb 6 oz (77.3 kg)   SpO2 98%   BMI 32.19 kg/m   Physical Exam  Constitutional: She is oriented to person, place, and time and well-developed, well-nourished, and in no distress.  HENT:  Head: Normocephalic and atraumatic.  Nose: Nose normal.  Mouth/Throat: Oropharynx is clear and moist. No oropharyngeal exudate.  Eyes: Conjunctivae are normal.  Neck: Neck supple. No thyromegaly present.  Cardiovascular: Normal rate, regular rhythm, normal heart sounds and intact distal pulses.   Pulmonary/Chest: Effort normal and breath sounds normal.  No respiratory distress. She has no wheezes. She has no rales. She exhibits no tenderness.  Abdominal: Soft. Bowel sounds are normal. She exhibits no distension. There is no tenderness.  Musculoskeletal: Normal range of motion.  Neurological: She is alert and oriented to person, place, and time. No cranial nerve deficit.  Skin: Skin is warm and dry.     Psychiatric: Affect normal.  Vitals reviewed.  Recent Results (from the past 2160 hour(s))  Urinalysis, Routine w reflex microscopic (not at [facility])     Status: Abnormal   Collection Time: 11/26/15  2:04 PM  Result Value Ref Range   Color, Urine YELLOW Yellow;Lt. Yellow   APPearance CLEAR Clear   Specific Gravity, Urine 1.025 1.000 - 1.030   pH 5.5 5.0 - 8.0   Total Protein, Urine TRACE (A) Negative   Urine Glucose NEGATIVE Negative   Ketones, ur NEGATIVE Negative   Bilirubin Urine NEGATIVE Negative   Hgb urine dipstick SMALL (A) Negative   Urobilinogen, UA 0.2 0.0 - 1.0   Leukocytes, UA NEGATIVE Negative   Nitrite NEGATIVE Negative   WBC, UA 0-2/hpf 0-2/hpf   RBC / HPF 0-2/hpf 0-2/hpf   Squamous Epithelial / LPF Rare(0-4/hpf) Rare(0-4/hpf)  TSH     Status: None   Collection Time: 01/03/16 11:22 AM  Result Value Ref Range   TSH 1.590 0.450 - 4.500  uIU/mL  B12 and Folate Panel     Status: None   Collection Time: 01/03/16 11:22 AM  Result Value Ref Range   Vitamin B-12 425 211 - 946 pg/mL   Folate 17.1 >3.0 ng/mL    Comment: A serum folate concentration of less than 3.1 ng/mL is considered to represent clinical deficiency.   Hemoglobin A1c     Status: Abnormal   Collection Time: 01/18/16 11:21 AM  Result Value Ref Range   Hgb A1c MFr Bld 7.8 (H) 4.6 - 6.5 %    Comment: Glycemic Control Guidelines for People with Diabetes:Non Diabetic:  <6%Goal of Therapy: <7%Additional Action Suggested:  >8%   Comp Met (CMET)     Status: Abnormal   Collection Time: 01/18/16 11:21 AM  Result Value Ref Range   Sodium 136 135 - 145 mEq/L   Potassium 5.4 (H) 3.5 - 5.1 mEq/L   Chloride 102 96 - 112 mEq/L   CO2 26 19 - 32 mEq/L   Glucose, Bld 211 (H) 70 - 99 mg/dL   BUN 32 (H) 6 - 23 mg/dL   Creatinine, Ser 1.03 0.40 - 1.20 mg/dL   Total Bilirubin 0.3 0.2 - 1.2 mg/dL   Alkaline Phosphatase 100 39 - 117 U/L   AST 9 0 - 37 U/L   ALT 10 0 - 35 U/L   Total Protein 8.0 6.0 - 8.3 g/dL   Albumin 3.5 3.5 - 5.2 g/dL   Calcium 9.1 8.4 - 10.5 mg/dL   GFR 58.30 (L) >60.00 mL/min  Comprehensive metabolic panel     Status: Abnormal   Collection Time: 02/03/16  4:35 PM  Result Value Ref Range   Sodium 132 (L) 135 - 145 mmol/L   Potassium 4.4 3.5 - 5.1 mmol/L   Chloride 100 (L) 101 - 111 mmol/L   CO2 21 (L) 22 - 32 mmol/L   Glucose, Bld 257 (H) 65 - 99 mg/dL   BUN 45 (H) 6 - 20 mg/dL   Creatinine, Ser 1.32 (H) 0.44 - 1.00 mg/dL   Calcium 8.9 8.9 - 10.3 mg/dL   Total Protein 7.6 6.5 -  8.1 g/dL   Albumin 2.3 (L) 3.5 - 5.0 g/dL   AST 17 15 - 41 U/L   ALT 21 14 - 54 U/L   Alkaline Phosphatase 207 (H) 38 - 126 U/L   Total Bilirubin 1.0 0.3 - 1.2 mg/dL   GFR calc non Af Amer 43 (L) >60 mL/min   GFR calc Af Amer 50 (L) >60 mL/min    Comment: (NOTE) The eGFR has been calculated using the CKD EPI equation. This calculation has not been validated in all  clinical situations. eGFR's persistently <60 mL/min signify possible Chronic Kidney Disease.    Anion gap 11 5 - 15  CBC with Differential     Status: Abnormal   Collection Time: 02/03/16  4:35 PM  Result Value Ref Range   WBC 12.5 (H) 4.0 - 10.5 K/uL   RBC 2.43 (L) 3.87 - 5.11 MIL/uL   Hemoglobin 7.1 (L) 12.0 - 15.0 g/dL   HCT 21.7 (L) 36.0 - 46.0 %   MCV 89.3 78.0 - 100.0 fL   MCH 29.2 26.0 - 34.0 pg   MCHC 32.7 30.0 - 36.0 g/dL   RDW 13.6 11.5 - 15.5 %   Platelets 503 (H) 150 - 400 K/uL   Neutrophils Relative % 80 %   Lymphocytes Relative 6 %   Monocytes Relative 6 %   Eosinophils Relative 0 %   Basophils Relative 0 %   Band Neutrophils 7 %   Metamyelocytes Relative 1 %   Neutro Abs 10.9 (H) 1.7 - 7.7 K/uL   Lymphs Abs 0.8 0.7 - 4.0 K/uL   Monocytes Absolute 0.8 0.1 - 1.0 K/uL   Eosinophils Absolute 0.0 0.0 - 0.7 K/uL   Basophils Absolute 0.0 0.0 - 0.1 K/uL  Culture, blood (routine x 2)     Status: Abnormal   Collection Time: 02/03/16  5:45 PM  Result Value Ref Range   Specimen Description BLOOD LEFT ANTECUBITAL    Special Requests BOTTLES DRAWN AEROBIC AND ANAEROBIC 5CC    Culture  Setup Time      GRAM POSITIVE COCCI IN CLUSTERS IN BOTH AEROBIC AND ANAEROBIC BOTTLES CRITICAL RESULT CALLED TO, READ BACK BY AND VERIFIED WITH: M. BELL, PHARMD AT 1640 ON 02/04/16 BY C. JESSUP, MLT. Performed at [facility] (A)    Report Status 02/06/2016 FINAL    Organism ID, Bacteria STAPHYLOCOCCUS AUREUS       Susceptibility   Staphylococcus aureus - MIC*    CIPROFLOXACIN <=0.5 SENSITIVE Sensitive     ERYTHROMYCIN <=0.25 SENSITIVE Sensitive     GENTAMICIN <=0.5 SENSITIVE Sensitive     OXACILLIN <=0.25 SENSITIVE Sensitive     TETRACYCLINE <=1 SENSITIVE Sensitive     VANCOMYCIN 1 SENSITIVE Sensitive     TRIMETH/SULFA <=10 SENSITIVE Sensitive     CLINDAMYCIN <=0.25 SENSITIVE Sensitive     RIFAMPIN <=0.5 SENSITIVE Sensitive     Inducible  Clindamycin NEGATIVE Sensitive     * STAPHYLOCOCCUS AUREUS  Blood Culture ID Panel (Reflexed)     Status: Abnormal   Collection Time: 02/03/16  5:45 PM  Result Value Ref Range   Enterococcus species NOT DETECTED NOT DETECTED   Listeria monocytogenes NOT DETECTED NOT DETECTED   Staphylococcus species DETECTED (A) NOT DETECTED    Comment: CRITICAL RESULT CALLED TO, READ BACK BY AND VERIFIED WITH: M. BELL, PHARMD AT 1640 ON 02/04/16 BY C. JESSUP, MLT.    Staphylococcus aureus DETECTED (A) NOT DETECTED  Comment: CRITICAL RESULT CALLED TO, READ BACK BY AND VERIFIED WITH: M. BELL, PHARMD AT 1640 ON 02/04/16 BY C. JESSUP, MLT.    Methicillin resistance NOT DETECTED NOT DETECTED   Streptococcus species NOT DETECTED NOT DETECTED   Streptococcus agalactiae NOT DETECTED NOT DETECTED   Streptococcus pneumoniae NOT DETECTED NOT DETECTED   Streptococcus pyogenes NOT DETECTED NOT DETECTED   Acinetobacter baumannii NOT DETECTED NOT DETECTED   Enterobacteriaceae species NOT DETECTED NOT DETECTED   Enterobacter cloacae complex NOT DETECTED NOT DETECTED   Escherichia coli NOT DETECTED NOT DETECTED   Klebsiella oxytoca NOT DETECTED NOT DETECTED   Klebsiella pneumoniae NOT DETECTED NOT DETECTED   Proteus species NOT DETECTED NOT DETECTED   Serratia marcescens NOT DETECTED NOT DETECTED   Haemophilus influenzae NOT DETECTED NOT DETECTED   Neisseria meningitidis NOT DETECTED NOT DETECTED   Pseudomonas aeruginosa NOT DETECTED NOT DETECTED   Candida albicans NOT DETECTED NOT DETECTED   Candida glabrata NOT DETECTED NOT DETECTED   Candida krusei NOT DETECTED NOT DETECTED   Candida parapsilosis NOT DETECTED NOT DETECTED   Candida tropicalis NOT DETECTED NOT DETECTED    Comment: Performed at [facility]  Urinalysis, Routine w reflex microscopic     Status: Abnormal   Collection Time: 02/03/16  6:00 PM  Result Value Ref Range   Color, Urine AMBER (A) YELLOW    Comment: BIOCHEMICALS MAY BE AFFECTED  BY COLOR   APPearance CLOUDY (A) CLEAR   Specific Gravity, Urine 1.020 1.005 - 1.030   pH 5.5 5.0 - 8.0   Glucose, UA 100 (A) NEGATIVE mg/dL   Hgb urine dipstick LARGE (A) NEGATIVE   Bilirubin Urine SMALL (A) NEGATIVE   Ketones, ur 15 (A) NEGATIVE mg/dL   Protein, ur 100 (A) NEGATIVE mg/dL   Nitrite NEGATIVE NEGATIVE   Leukocytes, UA SMALL (A) NEGATIVE  Urine microscopic-add on     Status: Abnormal   Collection Time: 02/03/16  6:00 PM  Result Value Ref Range   Squamous Epithelial / LPF 0-5 (A) NONE SEEN   WBC, UA 6-30 0 - 5 WBC/hpf   RBC / HPF 0-5 0 - 5 RBC/hpf   Bacteria, UA FEW (A) NONE SEEN   Casts GRANULAR CAST (A) NEGATIVE   Urine-Other AMORPHOUS URATES/PHOSPHATES     Comment: YEAST  Culture, blood (routine x 2)     Status: Abnormal   Collection Time: 02/03/16  6:15 PM  Result Value Ref Range   Specimen Description BLOOD RIGHT ARM    Special Requests BOTTLES DRAWN AEROBIC AND ANAEROBIC 10CC    Culture  Setup Time      GRAM POSITIVE COCCI IN CLUSTERS IN BOTH AEROBIC AND ANAEROBIC BOTTLES CRITICAL VALUE NOTED.  VALUE IS CONSISTENT WITH PREVIOUSLY REPORTED AND CALLED VALUE.    Culture (A)     STAPHYLOCOCCUS AUREUS SUSCEPTIBILITIES PERFORMED ON PREVIOUS CULTURE WITHIN THE LAST 5 DAYS. Performed at [facility]    Report Status 02/06/2016 FINAL   I-Stat CG4 Lactic Acid, ED     Status: None   Collection Time: 02/03/16  6:28 PM  Result Value Ref Range   Lactic Acid, Venous 0.62 0.5 - 1.9 mmol/L  CBG monitoring, ED     Status: Abnormal   Collection Time: 02/03/16  8:17 PM  Result Value Ref Range   Glucose-Capillary 215 (H) 65 - 99 mg/dL  Glucose, capillary     Status: Abnormal   Collection Time: 02/03/16  9:31 PM  Result Value Ref Range   Glucose-Capillary  196 (H) 65 - 99 mg/dL  Type and screen [place]     Status: None   Collection Time: 02/04/16  7:17 AM  Result Value Ref Range   ABO/RH(D) B POS    Antibody Screen NEG    Sample  Expiration 02/07/2016    Unit Number [id number]    Blood Component Type RED CELLS,LR    Unit division 00    Status of Unit ISSUED,FINAL    Transfusion Status OK TO TRANSFUSE    Crossmatch Result Compatible    Unit Number [id number]    Blood Component Type RED CELLS,LR    Unit division 00    Status of Unit ISSUED,FINAL    Transfusion Status OK TO TRANSFUSE    Crossmatch Result Compatible    Unit Number [id number]    Blood Component Type RED CELLS,LR    Unit division 00    Status of Unit ISSUED,FINAL    Transfusion Status OK TO TRANSFUSE    Crossmatch Result Compatible    Unit Number [id number]    Blood Component Type RED CELLS,LR    Unit division 00    Status of Unit ISSUED,FINAL    Transfusion Status OK TO TRANSFUSE    Crossmatch Result Compatible   Vitamin B12     Status: Abnormal   Collection Time: 02/04/16  7:17 AM  Result Value Ref Range   Vitamin B-12 1,066 (H) 180 - 914 pg/mL    Comment: (NOTE) This assay is not validated for testing neonatal or myeloproliferative syndrome specimens for Vitamin B12 levels.   Folate     Status: None   Collection Time: 02/04/16  7:17 AM  Result Value Ref Range   Folate 22.1 >5.9 ng/mL  Iron and TIBC     Status: Abnormal   Collection Time: 02/04/16  7:17 AM  Result Value Ref Range   Iron 17 (L) 28 - 170 ug/dL   TIBC 132 (L) 250 - 450 ug/dL   Saturation Ratios 13 10.4 - 31.8 %   UIBC 115 ug/dL  Ferritin     Status: Abnormal   Collection Time: 02/04/16  7:17 AM  Result Value Ref Range   Ferritin 1,105 (H) 11 - 307 ng/mL  Reticulocytes     Status: Abnormal   Collection Time: 02/04/16  7:17 AM  Result Value Ref Range   Retic Ct Pct 1.3 0.4 - 3.1 %   RBC. 1.95 (L) 3.87 - 5.11 MIL/uL   Retic Count, Manual 25.4 19.0 - 186.0 K/uL  ABO/Rh     Status: None   Collection Time: 02/04/16  7:17 AM  Result Value Ref Range   ABO/RH(D) B POS   Glucose, capillary     Status: Abnormal   Collection Time: 02/04/16  7:51 AM  Result  Value Ref Range   Glucose-Capillary 261 (H) 65 - 99 mg/dL  Respiratory Panel by PCR     Status: None   Collection Time: 02/04/16  8:57 AM  Result Value Ref Range   Adenovirus NOT DETECTED NOT DETECTED   Coronavirus 229E NOT DETECTED NOT DETECTED   Coronavirus HKU1 NOT DETECTED NOT DETECTED   Coronavirus NL63 NOT DETECTED NOT DETECTED   Coronavirus OC43 NOT DETECTED NOT DETECTED   Metapneumovirus NOT DETECTED NOT DETECTED   Rhinovirus / Enterovirus NOT DETECTED NOT DETECTED   Influenza A NOT DETECTED NOT DETECTED   Influenza B NOT DETECTED NOT DETECTED   Parainfluenza Virus 1 NOT DETECTED NOT DETECTED   Parainfluenza Virus 2  NOT DETECTED NOT DETECTED   Parainfluenza Virus 3 NOT DETECTED NOT DETECTED   Parainfluenza Virus 4 NOT DETECTED NOT DETECTED   Respiratory Syncytial Virus NOT DETECTED NOT DETECTED   Bordetella pertussis NOT DETECTED NOT DETECTED   Chlamydophila pneumoniae NOT DETECTED NOT DETECTED   Mycoplasma pneumoniae NOT DETECTED NOT DETECTED  Glucose, capillary     Status: Abnormal   Collection Time: 02/04/16 12:12 PM  Result Value Ref Range   Glucose-Capillary 187 (H) 65 - 99 mg/dL  Glucose, capillary     Status: Abnormal   Collection Time: 02/04/16  5:27 PM  Result Value Ref Range   Glucose-Capillary 161 (H) 65 - 99 mg/dL  Glucose, capillary     Status: Abnormal   Collection Time: 02/04/16  8:23 PM  Result Value Ref Range   Glucose-Capillary 165 (H) 65 - 99 mg/dL  CBC WITH DIFFERENTIAL     Status: Abnormal   Collection Time: 02/05/16  5:53 AM  Result Value Ref Range   WBC 9.8 4.0 - 10.5 K/uL   RBC 1.94 (L) 3.87 - 5.11 MIL/uL   Hemoglobin 5.6 (LL) 12.0 - 15.0 g/dL    Comment: REPEATED TO VERIFY CRITICAL RESULT CALLED TO, READ BACK BY AND VERIFIED WITH: L CARMAN,RN [id number] WILDERK    HCT 17.5 (L) 36.0 - 46.0 %   MCV 90.2 78.0 - 100.0 fL   MCH 28.9 26.0 - 34.0 pg   MCHC 32.0 30.0 - 36.0 g/dL   RDW 14.9 11.5 - 15.5 %   Platelets 399 150 - 400 K/uL    Neutrophils Relative % 66 %   Neutro Abs 6.5 1.7 - 7.7 K/uL   Lymphocytes Relative 21 %   Lymphs Abs 2.0 0.7 - 4.0 K/uL   Monocytes Relative 10 %   Monocytes Absolute 1.0 0.1 - 1.0 K/uL   Eosinophils Relative 3 %   Eosinophils Absolute 0.2 0.0 - 0.7 K/uL   Basophils Relative 0 %   Basophils Absolute 0.0 0.0 - 0.1 K/uL  Basic metabolic panel     Status: Abnormal   Collection Time: 02/05/16  5:53 AM  Result Value Ref Range   Sodium 135 135 - 145 mmol/L   Potassium 4.4 3.5 - 5.1 mmol/L   Chloride 105 101 - 111 mmol/L   CO2 23 22 - 32 mmol/L   Glucose, Bld 144 (H) 65 - 99 mg/dL   BUN 23 (H) 6 - 20 mg/dL   Creatinine, Ser 1.02 (H) 0.44 - 1.00 mg/dL   Calcium 8.2 (L) 8.9 - 10.3 mg/dL   GFR calc non Af Amer 59 (L) >60 mL/min   GFR calc Af Amer >60 >60 mL/min    Comment: (NOTE) The eGFR has been calculated using the CKD EPI equation. This calculation has not been validated in all clinical situations. eGFR's persistently <60 mL/min signify possible Chronic Kidney Disease.    Anion gap 7 5 - 15  Glucose, capillary     Status: Abnormal   Collection Time: 02/05/16  7:29 AM  Result Value Ref Range   Glucose-Capillary 137 (H) 65 - 99 mg/dL  Prepare RBC     Status: None   Collection Time: 02/05/16  7:36 AM  Result Value Ref Range   Order Confirmation ORDER PROCESSED BY BLOOD BANK   Glucose, capillary     Status: Abnormal   Collection Time: 02/05/16 11:48 AM  Result Value Ref Range   Glucose-Capillary 133 (H) 65 - 99 mg/dL  Glucose, capillary  Status: Abnormal   Collection Time: 02/05/16  4:50 PM  Result Value Ref Range   Glucose-Capillary 132 (H) 65 - 99 mg/dL  Culture, blood (Routine X 2) w Reflex to ID Panel     Status: None   Collection Time: 02/05/16  8:11 PM  Result Value Ref Range   Specimen Description BLOOD LEFT HAND    Special Requests IN PEDIATRIC BOTTLE 3CC    Culture NO GROWTH 5 DAYS    Report Status 02/10/2016 FINAL   Hemoglobin and hematocrit, blood     Status:  Abnormal   Collection Time: 02/05/16  8:14 PM  Result Value Ref Range   Hemoglobin 6.4 (LL) 12.0 - 15.0 g/dL    Comment: REPEATED TO VERIFY POST TRANSFUSION SPECIMEN CRITICAL VALUE NOTED.  VALUE IS CONSISTENT WITH PREVIOUSLY REPORTED AND CALLED VALUE.    HCT 19.6 (L) 36.0 - 46.0 %  HIV antibody     Status: None   Collection Time: 02/05/16  8:14 PM  Result Value Ref Range   HIV Screen 4th Generation wRfx Non Reactive Non Reactive    Comment: (NOTE) Performed At: [facility] [place], [place] [id number] Lindon Romp MD WU:[id number]   Prepare RBC     Status: None   Collection Time: 02/05/16  9:08 PM  Result Value Ref Range   Order Confirmation ORDER PROCESSED BY BLOOD BANK   Glucose, capillary     Status: Abnormal   Collection Time: 02/05/16  9:27 PM  Result Value Ref Range   Glucose-Capillary 155 (H) 65 - 99 mg/dL  Glucose, capillary     Status: Abnormal   Collection Time: 02/06/16  8:28 AM  Result Value Ref Range   Glucose-Capillary 112 (H) 65 - 99 mg/dL  Glucose, capillary     Status: Abnormal   Collection Time: 02/06/16 12:33 PM  Result Value Ref Range   Glucose-Capillary 142 (H) 65 - 99 mg/dL  Glucose, capillary     Status: Abnormal   Collection Time: 02/06/16  5:25 PM  Result Value Ref Range   Glucose-Capillary 119 (H) 65 - 99 mg/dL  Glucose, capillary     Status: Abnormal   Collection Time: 02/06/16 10:09 PM  Result Value Ref Range   Glucose-Capillary 112 (H) 65 - 99 mg/dL  CBC     Status: Abnormal   Collection Time: 02/07/16  2:44 AM  Result Value Ref Range   WBC 11.8 (H) 4.0 - 10.5 K/uL   RBC 3.40 (L) 3.87 - 5.11 MIL/uL   Hemoglobin 10.0 (L) 12.0 - 15.0 g/dL    Comment: DELTA CHECK NOTED REPEATED TO VERIFY POST TRANSFUSION SPECIMEN    HCT 30.1 (L) 36.0 - 46.0 %   MCV 88.5 78.0 - 100.0 fL   MCH 29.1 26.0 - 34.0 pg   MCHC 32.9 30.0 - 36.0 g/dL   RDW 15.0 11.5 - 15.5 %   Platelets 455 (H) 150 - 400 K/uL  Glucose, capillary      Status: None   Collection Time: 02/07/16  8:07 AM  Result Value Ref Range   Glucose-Capillary 73 65 - 99 mg/dL  Glucose, capillary     Status: Abnormal   Collection Time: 02/07/16 11:36 AM  Result Value Ref Range   Glucose-Capillary 102 (H) 65 - 99 mg/dL  Sedimentation rate     Status: Abnormal   Collection Time: 02/07/16  4:40 PM  Result Value Ref Range   Sed Rate 130 (H) 0 - 22 mm/hr  C-reactive  protein     Status: Abnormal   Collection Time: 02/07/16  4:40 PM  Result Value Ref Range   CRP 17.7 (H) <1.0 mg/dL  Renal function panel     Status: Abnormal   Collection Time: 02/07/16  4:40 PM  Result Value Ref Range   Sodium 133 (L) 135 - 145 mmol/L   Potassium 4.1 3.5 - 5.1 mmol/L   Chloride 100 (L) 101 - 111 mmol/L   CO2 23 22 - 32 mmol/L   Glucose, Bld 119 (H) 65 - 99 mg/dL   BUN 13 6 - 20 mg/dL   Creatinine, Ser 0.92 0.44 - 1.00 mg/dL   Calcium 8.7 (L) 8.9 - 10.3 mg/dL   Phosphorus 3.9 2.5 - 4.6 mg/dL   Albumin 1.8 (L) 3.5 - 5.0 g/dL   GFR calc non Af Amer >60 >60 mL/min   GFR calc Af Amer >60 >60 mL/min    Comment: (NOTE) The eGFR has been calculated using the CKD EPI equation. This calculation has not been validated in all clinical situations. eGFR's persistently <60 mL/min signify possible Chronic Kidney Disease.    Anion gap 10 5 - 15  Glucose, capillary     Status: Abnormal   Collection Time: 02/07/16  5:20 PM  Result Value Ref Range   Glucose-Capillary 119 (H) 65 - 99 mg/dL  Glucose, capillary     Status: Abnormal   Collection Time: 02/07/16  9:16 PM  Result Value Ref Range   Glucose-Capillary 115 (H) 65 - 99 mg/dL  Glucose, capillary     Status: None   Collection Time: 02/08/16  7:42 AM  Result Value Ref Range   Glucose-Capillary 91 65 - 99 mg/dL  ECHOCARDIOGRAM COMPLETE     Status: None   Collection Time: 02/08/16  9:13 AM  Result Value Ref Range   Weight 2,859.2 oz   Height 61 in   BP 140/66 mmHg  Glucose, capillary     Status: Abnormal   Collection  Time: 02/08/16 11:12 AM  Result Value Ref Range   Glucose-Capillary 111 (H) 65 - 99 mg/dL   Assessment/Plan: 1. Diabetic osteomyelitis ([place]) Recurrent. Present during recent hospitalization. BKA recommended but patient refused. PICC line recommended but patient refused and left AMA. Refuses re-admission or IR for PICC line placement at present. Endorses doing well on Keflex. On exam today, she is afebrile, well-hydrated and non-toxic appearing. Chronic foot wounds noted bilaterally without any active drainage, induration, erythema or palpable fluctuance. She was assessed by her Wound specialist yesterday and was told wounds were improving. Discussed consequences of spesis and osteomyelitis left without proper treatment. Discussed that although she feels well at the moment, this may change for the worse rapidly. She declines further medical intervention at present but will agree to repeat labs and urgent Ortho referral. Will check labs today. She has been scheduled for an appointment with Dr. Len Childs (Ortho) tomorrow at 2:30. Alarm signs/symptoms discussed with patient who agrees to go to ER if occurring.  - AMB referral to orthopedics - Comp Met (CMET) - CBC  2. Acute cystitis with hematuria Urine dip without LE or nitrites. Patient to continue Keflex as directed. Will check urine culture. - POCT urinalysis dipstick - CULTURE, URINE COMPREHENSIVE  3. Sepsis due to methicillin susceptible Staphylococcus aureus ([place]) As above. Refusing further intervention -- blood cultures, ER assessment or even IR appointment for placement of PICC line. Risks discussed and patient is aware of these.  4. Anemia in chronic kidney disease, unspecified CKD  stage Patient hydrated during hospitalization. Received transfusion. States she feels great. Denies known bleed. Will repeat CBC and renal function. She has appointment this week with her hematologist.   I spent greater than 1.5 hours with the patient including  time for record review, interview, examination, placing urgent referral and discussing risks associated with refusing treatment course for sepsis and osteomyelitis. [name], PA-C

## 2016-02-12 NOTE — Patient Instructions (Signed)
Please go to the lab for blood work. I will call you with your results Continue antibiotics as directed.  I have scheduled you for an appointment tomorrow with an Orthopedist for further management. Thankfully you clinically seem good today. Remember, this can change quickly. If there are any worsening symptoms, please go immediately to the ER.

## 2016-02-12 NOTE — Progress Notes (Signed)
Pre visit review using our clinic review tool, if applicable. No additional management support is needed unless otherwise documented below in the visit note/SLS  

## 2016-02-13 ENCOUNTER — Telehealth: Payer: Self-pay | Admitting: Physician Assistant

## 2016-02-13 ENCOUNTER — Telehealth: Payer: Self-pay | Admitting: *Deleted

## 2016-02-13 DIAGNOSIS — E875 Hyperkalemia: Secondary | ICD-10-CM

## 2016-02-13 DIAGNOSIS — M869 Osteomyelitis, unspecified: Secondary | ICD-10-CM | POA: Diagnosis not present

## 2016-02-13 NOTE — Telephone Encounter (Signed)
-----  Message from Brunetta Jeans, PA-C sent at 02/13/2016  9:34 AM EDT ----- Attempted to reach patent but no answer. LMOM for callback.  Her WBC count still mildly elevated. No major change from when in hospital but to be expected since she left before treatment completion. She was looking very good yesterday. She is seeing Ortho today for her Foot. Please make sure she is feeling well. She refused re-admission but agreed if she started feeling poorly again or if any fever was noted she would return to the ER. Her ALK phos level is elevated which is to be expected giving the osteomyelitis. Potassium was elevated - she needs to hydrate well. I want to repeat BMP in 1 week.   Again I would encourage completion of IV antibiotic course in the hospital. When she sees Ortho today they may likely tell her the same -- that she needs to return to ER for assessment and to ensure resolution of infection.

## 2016-02-13 NOTE — Telephone Encounter (Signed)
Spoke with RN. Results given verbally. Also a copy was printed and faxed to them.

## 2016-02-13 NOTE — Telephone Encounter (Signed)
Patient informed, understood & agreed; she is still feeling well today and has appt with Dr. Marin Olp tomorrow, lab order placed; provider informed/SLS 10/04

## 2016-02-13 NOTE — Telephone Encounter (Signed)
Caller name: Jacqulyn Bath Relationship to patient: [place] Can be reached: [phone] Pharmacy:  Reason for call: Need last A1C and Albumin on this patient.

## 2016-02-14 ENCOUNTER — Other Ambulatory Visit (HOSPITAL_BASED_OUTPATIENT_CLINIC_OR_DEPARTMENT_OTHER): Payer: PPO

## 2016-02-14 ENCOUNTER — Encounter: Payer: Self-pay | Admitting: Family

## 2016-02-14 ENCOUNTER — Ambulatory Visit (HOSPITAL_BASED_OUTPATIENT_CLINIC_OR_DEPARTMENT_OTHER): Payer: PPO | Admitting: Family

## 2016-02-14 VITALS — BP 140/69 | HR 89 | Temp 98.2°F | Resp 18 | Ht 61.0 in | Wt 168.4 lb

## 2016-02-14 DIAGNOSIS — D508 Other iron deficiency anemias: Secondary | ICD-10-CM

## 2016-02-14 DIAGNOSIS — D631 Anemia in chronic kidney disease: Secondary | ICD-10-CM

## 2016-02-14 DIAGNOSIS — D638 Anemia in other chronic diseases classified elsewhere: Secondary | ICD-10-CM

## 2016-02-14 DIAGNOSIS — IMO0002 Reserved for concepts with insufficient information to code with codable children: Secondary | ICD-10-CM

## 2016-02-14 DIAGNOSIS — M869 Osteomyelitis, unspecified: Secondary | ICD-10-CM

## 2016-02-14 DIAGNOSIS — Z794 Long term (current) use of insulin: Secondary | ICD-10-CM

## 2016-02-14 DIAGNOSIS — E1169 Type 2 diabetes mellitus with other specified complication: Secondary | ICD-10-CM

## 2016-02-14 DIAGNOSIS — D509 Iron deficiency anemia, unspecified: Secondary | ICD-10-CM

## 2016-02-14 DIAGNOSIS — K909 Intestinal malabsorption, unspecified: Secondary | ICD-10-CM

## 2016-02-14 DIAGNOSIS — E1139 Type 2 diabetes mellitus with other diabetic ophthalmic complication: Secondary | ICD-10-CM | POA: Diagnosis not present

## 2016-02-14 DIAGNOSIS — E1165 Type 2 diabetes mellitus with hyperglycemia: Secondary | ICD-10-CM | POA: Diagnosis not present

## 2016-02-14 DIAGNOSIS — N189 Chronic kidney disease, unspecified: Secondary | ICD-10-CM | POA: Diagnosis not present

## 2016-02-14 DIAGNOSIS — E1122 Type 2 diabetes mellitus with diabetic chronic kidney disease: Secondary | ICD-10-CM | POA: Diagnosis not present

## 2016-02-14 LAB — COMPREHENSIVE METABOLIC PANEL (CC13)
ALK PHOS: 178 IU/L — AB (ref 39–117)
ALT: 10 IU/L (ref 0–32)
AST (SGOT): 13 IU/L (ref 0–40)
Albumin, Serum: 3.1 g/dL — ABNORMAL LOW (ref 3.5–5.5)
Albumin/Globulin Ratio: 0.6 — ABNORMAL LOW (ref 1.2–2.2)
BILIRUBIN TOTAL: 0.3 mg/dL (ref 0.0–1.2)
BUN/Creatinine Ratio: 42 — ABNORMAL HIGH (ref 9–23)
BUN: 38 mg/dL — AB (ref 6–24)
CHLORIDE: 100 mmol/L (ref 96–106)
CO2: 26 mmol/L (ref 18–29)
CREATININE: 0.91 mg/dL (ref 0.57–1.00)
Calcium, Ser: 10.6 mg/dL — ABNORMAL HIGH (ref 8.7–10.2)
GFR calc Af Amer: 80 mL/min/{1.73_m2} (ref >59)
GFR calc non Af Amer: 69 mL/min/{1.73_m2} (ref >59)
GLUCOSE: 188 mg/dL — AB (ref 65–99)
Globulin, Total: 4.8 g/dL — ABNORMAL HIGH (ref 1.5–4.5)
Potassium, Ser: 6.3 mmol/L — ABNORMAL HIGH (ref 3.5–5.2)
Sodium: 136 mmol/L (ref 134–144)
TOTAL PROTEIN: 7.9 g/dL (ref 6.0–8.5)

## 2016-02-14 LAB — CBC WITH DIFFERENTIAL (CANCER CENTER ONLY)
BASO#: 0 10*3/uL (ref 0.0–0.2)
BASO%: 0.2 % (ref 0.0–2.0)
EOS ABS: 0.5 10*3/uL (ref 0.0–0.5)
EOS%: 4.1 % (ref 0.0–7.0)
HEMATOCRIT: 34.7 % — AB (ref 34.8–46.6)
HGB: 11.1 g/dL — ABNORMAL LOW (ref 11.6–15.9)
LYMPH#: 1.9 10*3/uL (ref 0.9–3.3)
LYMPH%: 14.1 % (ref 14.0–48.0)
MCH: 29.6 pg (ref 26.0–34.0)
MCHC: 32 g/dL (ref 32.0–36.0)
MCV: 93 fL (ref 81–101)
MONO#: 0.9 10*3/uL (ref 0.1–0.9)
MONO%: 6.4 % (ref 0.0–13.0)
NEUT#: 10 10*3/uL — ABNORMAL HIGH (ref 1.5–6.5)
NEUT%: 75.2 % (ref 39.6–80.0)
PLATELETS: 602 10*3/uL — AB (ref 145–400)
RBC: 3.75 10*6/uL (ref 3.70–5.32)
RDW: 13.7 % (ref 11.1–15.7)
WBC: 13.3 10*3/uL — ABNORMAL HIGH (ref 3.9–10.0)

## 2016-02-14 LAB — CULTURE, URINE COMPREHENSIVE: Organism ID, Bacteria: NO GROWTH

## 2016-02-14 NOTE — Progress Notes (Signed)
Hematology and Oncology Follow Up Visit  Dana [name] [id number] 20-Aug-1955 60 y.o. 02/14/2016   Principle Diagnosis:  Iron deficiency anemia Poorly controlled diabetes type II  Current Therapy:   IV iron as indicated - last received in June 2017     Interim History:  Dana [name] is here today for a follow-up. She was hospitalized at the end of September with urosepsis and osteomyelitis of the right foot. It was recommended that she have a R AKA but she would like a second opinion. She states that she has an appointment with orthopedic surgeon Dr. Alfredia Client on October 23rd.  She left the hospital AMA and is on Keflex PO BID for several more weeks.  She received blood, fluids and IV antibiotics during admission. Her CBC shows a Hgb 11.1 with an MCV of 93.  She is now home and appears to be doing fairly well. She has dressings to both feet that she changes daily. These are clean, dry and intact. She also has on her special boots on each foot.  She uses her cane to ambulate and has had no falls since being home. No syncopal episodes.  No fever, chills, n/v, cough, rash, dizziness, SOB, chest pain, palpitations, abdominal pain or changes in bowel or bladder habits.   No episodes of bleeding or bruising. No lymphadenopathy found on exam.  Her appetite is still poor and she admits she needs to drink more fluids. Her weight is down 10 lbs since her last visit. Her blood sugars are still not well controlled.   Neuropathy in hands and feet is unchanged.   Medications:    Medication List       Accurate as of 02/14/16  2:44 PM. Always use your most recent med list.          aspirin 81 MG tablet Take 81 mg by mouth daily.   atorvastatin 20 MG tablet Commonly known as:  LIPITOR Take 1 tablet (20 mg total) by mouth at bedtime.   cephALEXin 500 MG capsule Commonly known as:  KEFLEX Take 1,000 mg by mouth 2 (two) times daily.   escitalopram 10 MG tablet Commonly known as:   LEXAPRO Take 10 mg by mouth daily.   freestyle lancets Use as instructed to check blood sugar 3 times per day dx code E11.39   FREESTYLE LITE Devi Use to check blood sugar 3 times a day dx code E11.39   gabapentin 600 MG tablet Commonly known as:  NEURONTIN Take 1,200 mg by mouth 3 (three) times daily.   glucose blood test strip Commonly known as:  FREESTYLE LITE Use as instructed to check blood sugar 3 times a day dx code E11.39   ibuprofen 600 MG tablet Commonly known as:  ADVIL,MOTRIN Take 600 mg by mouth every 8 (eight) hours as needed for headache or moderate pain.   insulin regular 100 units/mL injection Commonly known as:  NOVOLIN R RELION Take 6 units with breakfast, 8 units with lunch and 8 units with dinner   INSULIN SYRINGE 1CC/29G 29G X 1/2" 1 ML Misc Use 1 per day.   metFORMIN 1000 MG tablet Commonly known as:  GLUCOPHAGE 1 bid   OVER THE COUNTER MEDICATION Take 2 each by mouth daily. Centrum-Muli. Vitamin Gummy   V-GO 30 Kit Use one per day       Allergies:  Allergies  Allergen Reactions  . Lithium Nausea And Vomiting and Other (See Comments)    Other reaction(s): Other (See Comments) Can  not keep this medication down. It makes her terribly ill. Can not keep this medication down. It makes her terribly ill.  . Ativan [Lorazepam] Other (See Comments)    Other reaction(s): ANAPHYLAXIS Other reaction(s): Other (See Comments) Abnormal behavior Abnormal behavior  . Demerol [Meperidine] Other (See Comments)    Other reaction(s): ANAPHYLAXIS Other reaction(s): Other (See Comments) Abnormal behavior Abnormal behavior  . Oxycodone Other (See Comments)    Other reaction(s): OTHER Abnormal behavior  . Darvocet [Propoxyphene N-Acetaminophen] Itching  . Latex Itching    Other reaction(s): OTHER  . Propoxyphene Itching    Past Medical History, Surgical history, Social history, and Family History were reviewed and updated.  Review of Systems: All  other 10 point review of systems is negative.   Physical Exam:  height is '5\' 1"'  (1.549 m) and weight is 168 lb 6.4 oz (76.4 kg). Her oral temperature is 98.2 F (36.8 C). Her blood pressure is 140/69 and her pulse is 89. Her respiration is 18.   Wt Readings from Last 3 Encounters:  02/14/16 168 lb 6.4 oz (76.4 kg)  02/12/16 170 lb 6 oz (77.3 kg)  02/07/16 178 lb 11.2 oz (81.1 kg)    Ocular: Sclerae unicteric, pupils equal, round and reactive to light Ear-nose-throat: Oropharynx clear, dentition fair Lymphatic: No cervical supraclavicular or axillary adenopathy Lungs no rales or rhonchi, good excursion bilaterally Heart regular rate and rhythm, no murmur appreciated Abd soft, nontender, positive bowel sounds, no liver or spleen tip palpated on exam, no fluid wave  MSK no focal spinal tenderness, no joint edema Neuro: non-focal, well-oriented, appropriate affect Breasts: Deferred  Lab Results  Component Value Date   WBC 13.9 (H) 02/12/2016   HGB 11.2 (L) 02/12/2016   HCT 33.7 (L) 02/12/2016   MCV 89.1 02/12/2016   PLT 686.0 (H) 02/12/2016   Lab Results  Component Value Date   FERRITIN 1,105 (H) 02/04/2016   IRON 17 (L) 02/04/2016   TIBC 132 (L) 02/04/2016   UIBC 115 02/04/2016   IRONPCTSAT 13 02/04/2016   Lab Results  Component Value Date   RETICCTPCT 1.3 02/04/2016   RBC 3.78 (L) 02/12/2016   No results found for: KPAFRELGTCHN, LAMBDASER, KAPLAMBRATIO No results found for: IGGSERUM, IGA, IGMSERUM No results found for: Odetta Pink, SPEI   Chemistry      Component Value Date/Time   NA 137 02/12/2016 1334   NA 139 11/07/2015 1336   K 5.6 (H) 02/12/2016 1334   K 4.9 11/07/2015 1336   CL 99 02/12/2016 1334   CL 99 09/20/2015 1344   CO2 30 02/12/2016 1334   CO2 27 11/07/2015 1336   BUN 37 (H) 02/12/2016 1334   BUN 26.4 (H) 11/07/2015 1336   CREATININE 1.00 02/12/2016 1334   CREATININE 1.0 11/07/2015 1336       Component Value Date/Time   CALCIUM 9.5 02/12/2016 1334   CALCIUM 9.3 11/07/2015 1336   ALKPHOS 174 (H) 02/12/2016 1334   ALKPHOS 112 11/07/2015 1336   AST 14 02/12/2016 1334   AST 13 11/07/2015 1336   ALT 11 02/12/2016 1334   ALT 12 11/07/2015 1336   BILITOT 0.4 02/12/2016 1334   BILITOT <0.30 11/07/2015 1336     Impression and Plan: Dana [name] is a very pleasant 60 yo white female with history of iron deficiency anemia and erythropoietin deficiency secondary to uncontrolled diabetes.  She has osteomyelitis of the right foot and will be seeing an orthopedic surgeon at  the end of the month for a second opinion regarding her need for an AKA.  She received blood during admission and her Hgb is stable at 11.1 with an MCV of 93.  We will see what her iron studies show and bring her back in next week for an infusion if needed.  We will plan to see her back in 2 months for repeat lab work and follow-up.  She will contact us with any questions or concerns. We can certainly see her sooner if need be.   Eliezer Bottom, NP 10/5/20172:44 PM

## 2016-02-15 LAB — IRON AND TIBC
%SAT: 13 % — ABNORMAL LOW (ref 21–57)
Iron: 24 ug/dL — ABNORMAL LOW (ref 41–142)
TIBC: 191 ug/dL — ABNORMAL LOW (ref 236–444)
UIBC: 167 ug/dL (ref 120–384)

## 2016-02-15 LAB — FERRITIN: Ferritin: 2360 ng/ml — ABNORMAL HIGH (ref 9–269)

## 2016-02-15 LAB — RETICULOCYTES: Reticulocyte Count: 0.7 % (ref 0.6–2.6)

## 2016-02-21 ENCOUNTER — Other Ambulatory Visit: Payer: Self-pay | Admitting: Pharmacist

## 2016-02-21 NOTE — Patient Outreach (Signed)
[facility]) Care Management  02/21/2016  Dana [name] May 21, 1955 [id number]  Patient was referred to [place] by [place], for medication assistance evaluation around her insulin and VGo.    Unsuccessful outreach to patient.  Left a HIPAA compliant message requesting a return call.   Plan:  If no return call from patient, will make another outreach attempt to patient next week.   Karrie Meres, PharmD, [place] [phone]

## 2016-02-22 ENCOUNTER — Telehealth: Payer: Self-pay | Admitting: Physician Assistant

## 2016-02-22 NOTE — Telephone Encounter (Signed)
Patient is calling regarding lab results.   Patient phone: [phone]

## 2016-02-22 NOTE — Telephone Encounter (Signed)
Called the patient informed of results. See result notes

## 2016-02-26 ENCOUNTER — Other Ambulatory Visit: Payer: Self-pay | Admitting: Pharmacist

## 2016-02-26 NOTE — Patient Outreach (Addendum)
[facility]) Care Management  02/26/2016  Kamani B [place] 1956/02/12 [id number]  Second unsuccessful outreach attempt to patient.  Left a HIPAA compliant message requesting a return call.   Plan:  If no return call from patient, will make a third outreach attempt to patient within the next week.   Karrie Meres, PharmD, [place] [phone]  Addendum:   1347---Incoming call from patient.  She verified her HIPAA details.   Explained purpose of call to patient and patient states she remembers meeting with [place] in the hospital last month.  She provided verbal consent for [place] services.   Patient reports her VGo costs ~$85/month and she obtains her insulin for $25/vial at [facility].   Discussed with patient [facility] Pharmacist is unsure if the manufacturer of VGo offers any sort of assistance for Medicare beneficiaries, but [facility] Pharmacist would contact the manufacturer to find out.    Discussed [name] Extra Help program with patient---patient reports she isn't sure if household income meets requirements.  Also discussed [name] program with patient including the income requirements, out-of-pocket spend requirement of $1,000 for prescriptions during the calendar year.    Patient reports she would like to review her financial status to see if she may be meet any of the above discussed program requirements.  Plan:  Will make phone outreach to patient within the next week to see if she determined her financial status.   Placed phone call to [facility] customer care and was told by representative that they do not offer any financial assistance for patient's with Medicare or Medicare Part D.

## 2016-02-28 DIAGNOSIS — L97512 Non-pressure chronic ulcer of other part of right foot with fat layer exposed: Secondary | ICD-10-CM | POA: Diagnosis not present

## 2016-02-28 DIAGNOSIS — E1140 Type 2 diabetes mellitus with diabetic neuropathy, unspecified: Secondary | ICD-10-CM | POA: Diagnosis not present

## 2016-02-28 DIAGNOSIS — E11621 Type 2 diabetes mellitus with foot ulcer: Secondary | ICD-10-CM | POA: Diagnosis not present

## 2016-02-28 DIAGNOSIS — L97515 Non-pressure chronic ulcer of other part of right foot with muscle involvement without evidence of necrosis: Secondary | ICD-10-CM | POA: Diagnosis not present

## 2016-02-28 DIAGNOSIS — M868X7 Other osteomyelitis, ankle and foot: Secondary | ICD-10-CM | POA: Diagnosis not present

## 2016-02-28 DIAGNOSIS — L97525 Non-pressure chronic ulcer of other part of left foot with muscle involvement without evidence of necrosis: Secondary | ICD-10-CM | POA: Diagnosis not present

## 2016-02-29 DIAGNOSIS — S91329A Laceration with foreign body, unspecified foot, initial encounter: Secondary | ICD-10-CM | POA: Diagnosis not present

## 2016-03-03 ENCOUNTER — Other Ambulatory Visit: Payer: Self-pay | Admitting: Pharmacist

## 2016-03-03 DIAGNOSIS — M79604 Pain in right leg: Secondary | ICD-10-CM | POA: Diagnosis not present

## 2016-03-03 DIAGNOSIS — M79605 Pain in left leg: Secondary | ICD-10-CM | POA: Diagnosis not present

## 2016-03-03 DIAGNOSIS — I70203 Unspecified atherosclerosis of native arteries of extremities, bilateral legs: Secondary | ICD-10-CM | POA: Diagnosis not present

## 2016-03-03 NOTE — Patient Outreach (Signed)
[facility]) Care Management  03/03/2016  Dana [name] 1955-10-13 [id number]  Unsuccessful phone outreach to patient.  Purpose of call was to follow-up with patient to see if she has determined her house hold income and out-of-pocket prescription drug spend to review requirements for [name] Extra Help and [employer] Patient Assistance Program.   Left a HIPAA compliant voicemail requesting a return call from patient.   Plan:  If no return call from patient, will make another outreach attempt in the next week.   Karrie Meres, PharmD, [place] [phone]

## 2016-03-04 ENCOUNTER — Telehealth: Payer: Self-pay | Admitting: Physician Assistant

## 2016-03-04 DIAGNOSIS — R0989 Other specified symptoms and signs involving the circulatory and respiratory systems: Secondary | ICD-10-CM

## 2016-03-04 NOTE — Telephone Encounter (Signed)
Patient is due for repeat CT abdomen to assess abnormal renal findings on prior outside CT obtained in June of this year. 3 month follow-up imaging was recommended.   I have placed order.

## 2016-03-05 NOTE — Telephone Encounter (Signed)
Called pt and left detailed message (per DPR) w/ information below and instructions to return call to office if any questions.

## 2016-03-06 ENCOUNTER — Ambulatory Visit: Payer: Self-pay | Admitting: Pharmacist

## 2016-03-07 DIAGNOSIS — M869 Osteomyelitis, unspecified: Secondary | ICD-10-CM | POA: Diagnosis not present

## 2016-03-11 DIAGNOSIS — E11621 Type 2 diabetes mellitus with foot ulcer: Secondary | ICD-10-CM | POA: Diagnosis not present

## 2016-03-11 DIAGNOSIS — L97522 Non-pressure chronic ulcer of other part of left foot with fat layer exposed: Secondary | ICD-10-CM | POA: Diagnosis not present

## 2016-03-11 DIAGNOSIS — L97512 Non-pressure chronic ulcer of other part of right foot with fat layer exposed: Secondary | ICD-10-CM | POA: Diagnosis not present

## 2016-03-11 DIAGNOSIS — L97425 Non-pressure chronic ulcer of left heel and midfoot with muscle involvement without evidence of necrosis: Secondary | ICD-10-CM | POA: Diagnosis not present

## 2016-03-11 DIAGNOSIS — M86271 Subacute osteomyelitis, right ankle and foot: Secondary | ICD-10-CM | POA: Diagnosis not present

## 2016-03-11 DIAGNOSIS — L97415 Non-pressure chronic ulcer of right heel and midfoot with muscle involvement without evidence of necrosis: Secondary | ICD-10-CM | POA: Diagnosis not present

## 2016-03-13 ENCOUNTER — Other Ambulatory Visit: Payer: Self-pay | Admitting: Pharmacist

## 2016-03-13 NOTE — Patient Outreach (Signed)
[facility]) Care Management  03/13/2016  Adylene B [place] 1955/08/26 [id number]  Unsuccessful attempt to reach patient, second attempt.  HIPAA compliant voicemail left requesting a return call from patient.    Plan:  If no return call from patient, will make a third outreach attempt next week.   Karrie Meres, PharmD, [place] [phone]

## 2016-03-16 NOTE — Progress Notes (Signed)
Erroneous encounter

## 2016-03-17 ENCOUNTER — Other Ambulatory Visit: Payer: Self-pay | Admitting: Physician Assistant

## 2016-03-17 ENCOUNTER — Other Ambulatory Visit: Payer: Self-pay | Admitting: Pharmacist

## 2016-03-17 NOTE — Patient Outreach (Signed)
[name] Care Management  03/17/2016  Dana [name] [birth year] [id number]  1354:  Third unsuccessful attempt to reach patient, HIPAA compliant voice mail left requesting return call.   1357:  Patient returned [name] call.  She verified HIPAA details.  Updated patient that it appears since VGo is non-formulary on her Part D plan, she may not be able to request a Tier exception per her [name].  VGo does not offer manufacturer patient assistance for Part D beneficiaries per a previous call to the manufacturer on 02/26/16.  Patient reports she is using Reli-on brand Novolin insulin at this time.  She doesn't believe she has met the $1,000 out-of-pocket spend requirement for [name].     Patient reports she applied to [name], she thinks she mailed the application, but doesn't remember when.  Counseled patient [name] typically mails beneficiaries a letter stating its decision once application is evaluated.   Patient denies other pharmacy needs at this time and states she will contact [name] if needs arise in the future.   Plan:  Will close pharmacy case at this time.   Patient confirmed having [name] phone number if new needs arise.   Karrie Meres, PharmD, [place] [phone]

## 2016-03-17 NOTE — Telephone Encounter (Signed)
Rx request to pharmacy/SLS  

## 2016-03-20 DIAGNOSIS — E11621 Type 2 diabetes mellitus with foot ulcer: Secondary | ICD-10-CM | POA: Diagnosis not present

## 2016-03-20 DIAGNOSIS — E11622 Type 2 diabetes mellitus with other skin ulcer: Secondary | ICD-10-CM | POA: Diagnosis not present

## 2016-03-20 DIAGNOSIS — M86271 Subacute osteomyelitis, right ankle and foot: Secondary | ICD-10-CM | POA: Diagnosis not present

## 2016-03-20 DIAGNOSIS — L97412 Non-pressure chronic ulcer of right heel and midfoot with fat layer exposed: Secondary | ICD-10-CM | POA: Diagnosis not present

## 2016-03-20 DIAGNOSIS — L97422 Non-pressure chronic ulcer of left heel and midfoot with fat layer exposed: Secondary | ICD-10-CM | POA: Diagnosis not present

## 2016-03-20 DIAGNOSIS — L97415 Non-pressure chronic ulcer of right heel and midfoot with muscle involvement without evidence of necrosis: Secondary | ICD-10-CM | POA: Diagnosis not present

## 2016-03-20 DIAGNOSIS — L97425 Non-pressure chronic ulcer of left heel and midfoot with muscle involvement without evidence of necrosis: Secondary | ICD-10-CM | POA: Diagnosis not present

## 2016-03-20 DIAGNOSIS — L97312 Non-pressure chronic ulcer of right ankle with fat layer exposed: Secondary | ICD-10-CM | POA: Diagnosis not present

## 2016-03-20 DIAGNOSIS — M86272 Subacute osteomyelitis, left ankle and foot: Secondary | ICD-10-CM | POA: Diagnosis not present

## 2016-03-21 ENCOUNTER — Telehealth: Payer: Self-pay | Admitting: Physician Assistant

## 2016-03-21 DIAGNOSIS — S91329A Laceration with foreign body, unspecified foot, initial encounter: Secondary | ICD-10-CM | POA: Diagnosis not present

## 2016-03-21 NOTE — Telephone Encounter (Signed)
Pt dropped off a medical history form for Dana [name] to fill out, documents placed in tray at front office

## 2016-03-26 ENCOUNTER — Ambulatory Visit: Payer: PPO | Admitting: Endocrinology

## 2016-03-31 DIAGNOSIS — S91329A Laceration with foreign body, unspecified foot, initial encounter: Secondary | ICD-10-CM | POA: Diagnosis not present

## 2016-04-01 DIAGNOSIS — E11621 Type 2 diabetes mellitus with foot ulcer: Secondary | ICD-10-CM | POA: Diagnosis not present

## 2016-04-01 DIAGNOSIS — L97311 Non-pressure chronic ulcer of right ankle limited to breakdown of skin: Secondary | ICD-10-CM | POA: Diagnosis not present

## 2016-04-01 DIAGNOSIS — L97521 Non-pressure chronic ulcer of other part of left foot limited to breakdown of skin: Secondary | ICD-10-CM | POA: Diagnosis not present

## 2016-04-01 DIAGNOSIS — L97425 Non-pressure chronic ulcer of left heel and midfoot with muscle involvement without evidence of necrosis: Secondary | ICD-10-CM | POA: Diagnosis not present

## 2016-04-01 DIAGNOSIS — M86271 Subacute osteomyelitis, right ankle and foot: Secondary | ICD-10-CM | POA: Diagnosis not present

## 2016-04-01 DIAGNOSIS — E1140 Type 2 diabetes mellitus with diabetic neuropathy, unspecified: Secondary | ICD-10-CM | POA: Diagnosis not present

## 2016-04-01 DIAGNOSIS — M868X7 Other osteomyelitis, ankle and foot: Secondary | ICD-10-CM | POA: Diagnosis not present

## 2016-04-01 DIAGNOSIS — L97415 Non-pressure chronic ulcer of right heel and midfoot with muscle involvement without evidence of necrosis: Secondary | ICD-10-CM | POA: Diagnosis not present

## 2016-04-01 DIAGNOSIS — L97513 Non-pressure chronic ulcer of other part of right foot with necrosis of muscle: Secondary | ICD-10-CM | POA: Diagnosis not present

## 2016-04-01 DIAGNOSIS — M86272 Subacute osteomyelitis, left ankle and foot: Secondary | ICD-10-CM | POA: Diagnosis not present

## 2016-04-01 DIAGNOSIS — E11622 Type 2 diabetes mellitus with other skin ulcer: Secondary | ICD-10-CM | POA: Diagnosis not present

## 2016-04-07 ENCOUNTER — Ambulatory Visit (INDEPENDENT_AMBULATORY_CARE_PROVIDER_SITE_OTHER): Payer: PPO | Admitting: Endocrinology

## 2016-04-07 ENCOUNTER — Other Ambulatory Visit: Payer: Self-pay

## 2016-04-07 ENCOUNTER — Encounter: Payer: Self-pay | Admitting: Endocrinology

## 2016-04-07 ENCOUNTER — Telehealth: Payer: Self-pay | Admitting: Emergency Medicine

## 2016-04-07 VITALS — BP 128/70 | HR 79 | Ht 61.0 in | Wt 164.0 lb

## 2016-04-07 DIAGNOSIS — E1142 Type 2 diabetes mellitus with diabetic polyneuropathy: Secondary | ICD-10-CM | POA: Diagnosis not present

## 2016-04-07 DIAGNOSIS — Z794 Long term (current) use of insulin: Secondary | ICD-10-CM

## 2016-04-07 DIAGNOSIS — E1165 Type 2 diabetes mellitus with hyperglycemia: Secondary | ICD-10-CM | POA: Diagnosis not present

## 2016-04-07 MED ORDER — INSULIN GLARGINE 300 UNIT/ML ~~LOC~~ SOPN: 34.0000 [IU] | PEN_INJECTOR | Freq: Every day | SUBCUTANEOUS | 2 refills | Status: DC

## 2016-04-07 NOTE — Progress Notes (Signed)
Patient ID: Dana [name], female   DOB: 10/03/1955, 60 y.o.   MRN: [id number]           Reason for Appointment: Follow-up for Type 2 Diabetes  Referring physician: Elyn Aquas  History of Present Illness:          Date of diagnosis of type 2 diabetes mellitus: 2006       Background history:   She thinks her blood sugar was 300-400 at the time of diagnosis and she was started on insulin soon after this. She thinks she has been on metformin only for the last 5 years Her A1c history is available since only about 2013 and this had been consistently over 10%  Recent history:    She  has had  poor control of her diabetes with pre-consultation A1c of 13%, was on Levemir  Because of her poor control and need for multiple injections and cost of brand name insulin she was started on the V-go pump after instruction by the nurse educator on 04/11/15  Her A1c as of 9/17 is  7.8, previously 7.5%, no recent labs available  INSULIN regimen is:  V-go 30 units pump, 3 clicks before meals, 0-1V daily  Current blood sugar patterns, daily management and problems identified:  She was having variable blood sugars on her last visit with usually high postprandial readings and somewhat worsening control    she was told to cut back on regular soft drinks that she was consuming and she has reduced these considerably.  With this she has lost weight also  However  is drinking  1-2 cups of milk a day  Overall her blood sugars are significantly better and less variable compared to her last visit  However she does have some variability in her fasting readings and occasionally may have high readings at bedtime also  No hypoglycemia at any time  She is consistent with doing her boluses at meals  She is unclear about what dose of metformin she is taking; she was told to increase the dose to 1000 mg in the evening previously because of high fasting readings                         Non-insulin  hypoglycemic drugs the patient is taking are: Metformin 1g bid Side effects from medications have been: None  Compliance with the medical regimen: Good Hypoglycemia: none   Glucose monitoring:  done 2 times a day         Glucometer:  FreeStyle .      Blood Glucose readings by time of day  Mean values apply above for all meters except median for One Touch  PRE-MEAL Fasting Lunch Dinner Bedtime Overall  Glucose range: 91-212  108-163  104-177  72-239    Mean/median: 146   140  139  141    Self-care:  Typical meal intake: Breakfast is irregular otherwise may have toast and an egg or sausage.  Lunch is a sandwich or cheeseburger, evening meal is meat and 2 vegetables.  For snacks he will have peanut butter crackers in pms Dinner 7 pm     She has regular drinks 0-1/2 per day, milk 1 1/2 cups daily          Dietician visit, most recent: Never               Exercise: none  Weight history: Stable over the last 3-4 years, was 400 pounds about 5  years ago  Wt Readings from Last 3 Encounters:  04/07/16 164 lb (74.4 kg)  02/14/16 168 lb 6.4 oz (76.4 kg)  02/12/16 170 lb 6 oz (77.3 kg)    Glycemic control:   Lab Results  Component Value Date   HGBA1C 7.8 (H) 01/18/2016   HGBA1C 7.5 09/20/2015   HGBA1C 7.1 06/06/2015   Lab Results  Component Value Date   MICROALBUR 4.0 (H) 11/08/2014   LDLCALC 57 03/09/2015   CREATININE 0.91 02/14/2016         Medication List       Accurate as of 04/07/16  9:06 PM. Always use your most recent med list.          aspirin 81 MG tablet Take 81 mg by mouth daily.   atorvastatin 20 MG tablet Commonly known as:  LIPITOR Take 1 tablet (20 mg total) by mouth at bedtime.   cephALEXin 500 MG capsule Commonly known as:  KEFLEX Take 1,000 mg by mouth 2 (two) times daily.   escitalopram 10 MG tablet Commonly known as:  LEXAPRO Take 20.5 mg by mouth daily.   freestyle lancets Use as instructed to check blood sugar 3 times per day dx  code E11.39   FREESTYLE LITE Devi Use to check blood sugar 3 times a day dx code E11.39   gabapentin 600 MG tablet Commonly known as:  NEURONTIN TAKE TWO TABLETS BY MOUTH THREE TIMES DAILY   glucose blood test strip Commonly known as:  FREESTYLE LITE Use as instructed to check blood sugar 3 times a day dx code E11.39   ibuprofen 600 MG tablet Commonly known as:  ADVIL,MOTRIN Take 600 mg by mouth every 8 (eight) hours as needed for headache or moderate pain.   Insulin Glargine 300 UNIT/ML Sopn Commonly known as:  TOUJEO SOLOSTAR Inject 34 Units into the skin daily.   insulin regular 250 units/2.28m (100 units/mL) injection Commonly known as:  NOVOLIN R RELION Take 6 units with breakfast, 8 units with lunch and 8 units with dinner   INSULIN SYRINGE 1CC/29G 29G X 1/2" 1 ML Misc Use 1 per day.   metFORMIN 1000 MG tablet Commonly known as:  GLUCOPHAGE 1 bid   OVER THE COUNTER MEDICATION Take 2 each by mouth daily. Centrum-Muli. Vitamin Gummy   V-GO 30 Kit Use one per day       Allergies:  Allergies  Allergen Reactions  . Lithium Nausea And Vomiting and Other (See Comments)    Other reaction(s): Other (See Comments) Can not keep this medication down. It makes her terribly ill. Can not keep this medication down. It makes her terribly ill.  . Ativan [Lorazepam] Other (See Comments)    Other reaction(s): ANAPHYLAXIS Other reaction(s): Other (See Comments) Abnormal behavior Abnormal behavior  . Demerol [Meperidine] Other (See Comments)    Other reaction(s): ANAPHYLAXIS Other reaction(s): Other (See Comments) Abnormal behavior Abnormal behavior  . Oxycodone Other (See Comments)    Other reaction(s): OTHER Abnormal behavior  . Darvocet [Propoxyphene N-Acetaminophen] Itching  . Latex Itching    Other reaction(s): OTHER  . Propoxyphene Itching    Past Medical History:  Diagnosis Date  . Bipolar 1 disorder ([facility]   . Cellulitis and abscess of foot 03/08/2015  .  Diabetes mellitus    INSULIN DEPENDENT  . Diabetic neuropathy ([facility]   . Diabetic neuropathy ([place]   . 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 H/O hiatal hernia   . Hyperlipidemia   . Hypertension    past  hx of  . Iron malabsorption 09/26/2015  . Numbness and tingling    Hx; of in B/LLE and B/LUE  . Other iron deficiency anemias 09/20/2015    Past Surgical History:  Procedure Laterality Date  . ABDOMINAL HYSTERECTOMY    . ADENOIDECTOMY     Hx: of  . AMPUTATION  10/24/2011   Procedure: AMPUTATION RAY;  Surgeon: Newt Minion, MD;  Location: [facility];  Service: Orthopedics;  Laterality: Right;  Right Foot 3rd Ray Amputation  . AMPUTATION Right 12/16/2012   Procedure: Right Foot Transmetatarsal Amputation;  Surgeon: Newt Minion, MD;  Location: [facility];  Service: Orthopedics;  Laterality: Right;  . AMPUTATION Left 03/09/2015   Procedure: LEFT FOOT 1ST RAY AMPUTATION;  Surgeon: Newt Minion, MD;  Location: [facility];  Service: Orthopedics;  Laterality: Left;  . BLADDER SURGERY     x 2, tacked 1st time; mesh "eroded", had to be removed  . Bladder Tact   2002  . BREAST SURGERY Left    "knot removed"  . CARPAL TUNNEL RELEASE Left   . COLON SURGERY    . NASAL SEPTUM SURGERY  1976  . TONSILLECTOMY     age 24's    Family History  Problem Relation Age of Onset  . Diabetes Mother   . Mental illness Mother   . Bipolar disorder Mother   . Hypertension Father   . Diabetes Maternal Grandmother   . Heart disease Neg Hx     Social History:  reports that she has never smoked. She has never used smokeless tobacco. She reports that she does not drink alcohol or use drugs.    Review of Systems   DIABETIC foot ulcers with history of amputations: She is being followed at the wound Center with bilateral ulcers  Orthopedic surgeon is planning to do further procedure on her right foot where she was told she has a day bone  She has had extra sensitivity of her feet when trying to stand or walk.   Feels like she is walking on rocks She is using bilateral boots currently  Has been on gabapentin and analgesics from PCP Could not tolerate Cymbalta She did benefit from a trial of capsaicin that was suggested    Lipid history: On treatment with Lipitor and followed by PCP    Lab Results  Component Value Date   CHOL 103 03/09/2015   HDL 27 (L) 03/09/2015   LDLCALC 57 03/09/2015   TRIG 96 03/09/2015   CHOLHDL 3.8 03/09/2015          Last diabetic foot exam was in 03/2015, she does have History of neuropathy with sensory loss, is followed in the wound Center   Physical Examination:  BP 128/70   Pulse 79   Ht _0  (1.549 m)   Wt 164 lb (74.4 kg)   SpO2 93%   BMI 30.99 kg/m         ASSESSMENT:  Diabetes type 2, uncontrolled     See history of present illness for detailed discussion of his current management, blood sugar patterns and problems identified  Her blood sugars are averaging  lower than before and may be improving with her trying to lose weight, improve her diet and cut out regular soft drinks as discussed on the last visit Fasting readings are not consistently normal but not clear why they are higher on some days Also has not had as many postprandial readings Unclear what dose of metformin she is taking  since she is supposed to be on a 1000 mg tablet now  She does not want to continue the V-go pump even though it has helped her recently because of the cost  Recommendations: She will switch to Toujeo insulin instead of the V-go pump, this appears to be covered on her insurance Given her a flowsheet to titrate the dose starting with 34 units at suppertime and increasing every 3 days by 2 units, blood sugar target 90-130 She will call if she has difficulties adjusting the dose Information booklet given on Toujeo She will also use the [employer] brand regular insulin with syringe when she stopped the V-go pump, most likely will need at least 2 units more than when  she was using the pump Discussed how to draw up insulin in the syringe and give it 30 minute before eating  Patient Instructions  Toujeo insulin 1x daily at supper start with 34 units  Regular insulin 8 units before meals     Counseling time on subjects discussed above is over 50% of today's 25 minute visit  Dana [name] 04/07/2016, 9:06 PM   Note: This office note was prepared with Estate agent. Any transcriptional errors that result from this process are unintentional.

## 2016-04-07 NOTE — Telephone Encounter (Signed)
Received Surgical Clearance from [facility] Dr Len Childs for Right Ankle amputation on 04/21/16.  Left message on machine advising patient to call back to schedule appointment for surgical clearance

## 2016-04-07 NOTE — Patient Instructions (Signed)
Toujeo insulin 1x daily at supper start with 34 units  Regular insulin 8 units before meals

## 2016-04-08 ENCOUNTER — Other Ambulatory Visit: Payer: Self-pay

## 2016-04-08 NOTE — Telephone Encounter (Signed)
Patient spoke with someone at [facility] today and was advised form was not at this location, patient checking on the status of message below, please advise

## 2016-04-09 ENCOUNTER — Telehealth: Payer: Self-pay | Admitting: *Deleted

## 2016-04-09 NOTE — Telephone Encounter (Signed)
Received fax from [facility] requesting Medical/Surgical Clearance notes; patient has appt with PCP at [facility] office on April 11, 2016; faxed paperwork to PCP/[facility] 11/29

## 2016-04-09 NOTE — Telephone Encounter (Signed)
Spoke with Ivin Booty to see if she has seen the medical history form.  She stated that she received it and she faxed it this morning-confirmation received at (10:40am) that forms went through to [place] at [facility].//AB/CMA

## 2016-04-11 ENCOUNTER — Telehealth: Payer: Self-pay | Admitting: Emergency Medicine

## 2016-04-11 ENCOUNTER — Encounter: Payer: Self-pay | Admitting: Physician Assistant

## 2016-04-11 ENCOUNTER — Ambulatory Visit (INDEPENDENT_AMBULATORY_CARE_PROVIDER_SITE_OTHER): Payer: PPO | Admitting: Physician Assistant

## 2016-04-11 ENCOUNTER — Telehealth: Payer: Self-pay | Admitting: Physician Assistant

## 2016-04-11 VITALS — BP 129/78 | HR 89 | Temp 97.8°F | Resp 17 | Ht 61.0 in | Wt 162.4 lb

## 2016-04-11 DIAGNOSIS — Z885 Allergy status to narcotic agent status: Secondary | ICD-10-CM | POA: Diagnosis not present

## 2016-04-11 DIAGNOSIS — M869 Osteomyelitis, unspecified: Secondary | ICD-10-CM | POA: Diagnosis not present

## 2016-04-11 DIAGNOSIS — L97529 Non-pressure chronic ulcer of other part of left foot with unspecified severity: Secondary | ICD-10-CM | POA: Diagnosis not present

## 2016-04-11 DIAGNOSIS — I739 Peripheral vascular disease, unspecified: Secondary | ICD-10-CM | POA: Diagnosis not present

## 2016-04-11 DIAGNOSIS — Z01818 Encounter for other preprocedural examination: Secondary | ICD-10-CM | POA: Diagnosis not present

## 2016-04-11 DIAGNOSIS — I83009 Varicose veins of unspecified lower extremity with ulcer of unspecified site: Secondary | ICD-10-CM | POA: Diagnosis not present

## 2016-04-11 DIAGNOSIS — E11621 Type 2 diabetes mellitus with foot ulcer: Secondary | ICD-10-CM | POA: Diagnosis not present

## 2016-04-11 DIAGNOSIS — E1140 Type 2 diabetes mellitus with diabetic neuropathy, unspecified: Secondary | ICD-10-CM | POA: Diagnosis not present

## 2016-04-11 DIAGNOSIS — Z23 Encounter for immunization: Secondary | ICD-10-CM | POA: Diagnosis not present

## 2016-04-11 DIAGNOSIS — D473 Essential (hemorrhagic) thrombocythemia: Secondary | ICD-10-CM | POA: Diagnosis not present

## 2016-04-11 DIAGNOSIS — E1151 Type 2 diabetes mellitus with diabetic peripheral angiopathy without gangrene: Secondary | ICD-10-CM | POA: Diagnosis not present

## 2016-04-11 DIAGNOSIS — R799 Abnormal finding of blood chemistry, unspecified: Secondary | ICD-10-CM | POA: Diagnosis not present

## 2016-04-11 DIAGNOSIS — D649 Anemia, unspecified: Secondary | ICD-10-CM | POA: Diagnosis not present

## 2016-04-11 DIAGNOSIS — Z79899 Other long term (current) drug therapy: Secondary | ICD-10-CM | POA: Diagnosis not present

## 2016-04-11 DIAGNOSIS — R531 Weakness: Secondary | ICD-10-CM | POA: Diagnosis not present

## 2016-04-11 LAB — CBC
Hemoglobin: 7.5 g/dL — CL (ref 12.0–15.0)
MCHC: 33.2 g/dL (ref 30.0–36.0)
MCV: 85.3 fl (ref 78.0–100.0)
Platelets: 820 10*3/uL — ABNORMAL HIGH (ref 150.0–400.0)
RBC: 2.64 Mil/uL — ABNORMAL LOW (ref 3.87–5.11)
RDW: 15.3 % (ref 11.5–15.5)
WBC: 11 10*3/uL — ABNORMAL HIGH (ref 4.0–10.5)

## 2016-04-11 LAB — BASIC METABOLIC PANEL
BUN: 42 mg/dL — AB (ref 6–23)
CALCIUM: 9.1 mg/dL (ref 8.4–10.5)
CO2: 27 mEq/L (ref 19–32)
Chloride: 103 mEq/L (ref 96–112)
Creatinine, Ser: 1.01 mg/dL (ref 0.40–1.20)
GFR: 59.58 mL/min — AB (ref >60.00)
Glucose, Bld: 149 mg/dL — ABNORMAL HIGH (ref 70–99)
POTASSIUM: 5.5 meq/L — AB (ref 3.5–5.1)
SODIUM: 139 meq/L (ref 135–145)

## 2016-04-11 LAB — PROTIME-INR
INR: 1.2 ratio — AB (ref 0.8–1.0)
Prothrombin Time: 12.9 s (ref 9.6–13.1)

## 2016-04-11 NOTE — Progress Notes (Signed)
P   Patient presents to clinic today for preoperative clearance. Patient is scheduled for R ankle amputation on 04/21/16 with Dr. Len Childs [facility]). 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 Patient endorses history of multiple surgeries under general anesthesia. Endorses tolerating anesthesia well without side effect. Patient denies fever, chills, malaise or fatigue. Endorses fasting sugars are averaging 100-150. Is taking medications as directed. Patient denies chest pain, palpitations, lightheadedness, dizziness, vision changes or frequent headaches. Patient has already stopped her 81 mg ASA daily per instructions from Orthopedics.    Past Medical History:  Diagnosis Date  . Bipolar 1 disorder ([place])   . Cellulitis and abscess of foot 03/08/2015  . Diabetes mellitus    INSULIN DEPENDENT  . Diabetic neuropathy ([place])   . Diabetic neuropathy ([place])   . 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 H/O hiatal hernia   . Hyperlipidemia   . Hypertension    past hx of  . Iron malabsorption 09/26/2015  . Numbness and tingling    Hx; of in B/LLE and B/LUE  . Other iron deficiency anemias 09/20/2015    Current Outpatient Prescriptions on File Prior to Visit  Medication Sig Dispense Refill  . aspirin 81 MG tablet Take 81 mg by mouth daily.    Marland Kitchen atorvastatin (LIPITOR) 20 MG tablet Take 1 tablet (20 mg total) by mouth at bedtime. 30 tablet 5  . Blood Glucose Monitoring Suppl (FREESTYLE LITE) DEVI Use to check blood sugar 3 times a day dx code E11.39 1 each 0  . cephALEXin (KEFLEX) 500 MG capsule Take 1,000 mg by mouth 2 (two) times daily.    Marland Kitchen escitalopram (LEXAPRO) 10 MG tablet Take 20.5 mg by mouth daily.     Marland Kitchen gabapentin (NEURONTIN) 600 MG tablet TAKE TWO TABLETS BY MOUTH THREE TIMES DAILY 180 tablet 3  .  glucose blood (FREESTYLE LITE) test strip Use as instructed to check blood sugar 3 times a day dx code E11.39 100 each 3  . ibuprofen (ADVIL,MOTRIN) 600 MG tablet Take 600 mg by mouth every 8 (eight) hours as needed for headache or moderate pain.     . Insulin Disposable Pump (V-GO 30) KIT Use one per day 1 kit 2  . Insulin Glargine (TOUJEO SOLOSTAR) 300 UNIT/ML SOPN Inject 34 Units into the skin daily. 15 mL 2  . insulin regular (NOVOLIN R RELION) 100 units/mL injection Take 6 units with breakfast, 8 units with lunch and 8 units with dinner (Patient taking differently: Inject 4-10 Units into the skin 3 (three) times daily before meals. Per meal size, small meals = less pumps to Vgo and large meal = more pumps to Vgo. 1 pump = 2 units.) 10 mL 3  . INSULIN SYRINGE 1CC/29G 29G X 1/2" 1 ML MISC Use 1 per day. 100 each 2  . Lancets (FREESTYLE) lancets Use as instructed to check blood sugar 3 times per day dx code E11.39 100 each 3  . metFORMIN (GLUCOPHAGE) 1000 MG tablet 1 bid (Patient taking differently: Take 500-1,000 mg by mouth See admin instructions. Take 1000 mg by mouth in the morning and take 500 mg by mouth in the evening.) 180 tablet 5  . OVER THE COUNTER MEDICATION Take 2 each by mouth daily. Centrum-Muli. Vitamin Gummy     No current facility-administered medications on file prior to visit.     Allergies  Allergen Reactions  . Lithium Nausea And Vomiting and Other (See Comments)    Other reaction(s): Other (See Comments) Can not keep this medication down. It makes her terribly ill. Can not keep this medication down. It makes her terribly ill.  . Ativan [Lorazepam] Other (See Comments)    Other reaction(s): ANAPHYLAXIS Other reaction(s): Other (See Comments) Abnormal behavior Abnormal behavior  . Demerol [Meperidine] Other (See Comments)    Other reaction(s): ANAPHYLAXIS Other reaction(s): Other (See Comments) Abnormal behavior Abnormal behavior  . Oxycodone Other (See Comments)      Other reaction(s): OTHER Abnormal behavior  . Darvocet [Propoxyphene N-Acetaminophen] Itching  . Latex Itching    Other reaction(s): OTHER  . Propoxyphene Itching    Family History  Problem Relation Age of Onset  . Diabetes Mother   . Mental illness Mother   . Bipolar disorder Mother   . Hypertension Father   . Diabetes Maternal Grandmother   . Heart disease Neg Hx     Social History   Social History  . Marital status: Married    Spouse name: N/A  . Number of children: 2  . Years of education: 14   Occupational History  .      disabled   Social History Main Topics  . Smoking status: Never Smoker  . Smokeless tobacco: Never Used  . Alcohol use No  . Drug use: No  . Sexual activity: No   Other Topics Concern  . None   Social History Narrative   Lives with husband in home   Caffeine use - Coke 2 or 3 16 oz daily   Review of Systems - See HPI.  All other ROS are negative.  BP 129/78   Pulse 89   Temp 97.8 F (36.6 C) (Oral)   Resp 17   Ht '5\' 1"'  (1.549 m)   Wt 162 lb 6 oz (73.7 kg)   SpO2 97%   BMI 30.68 kg/m   Physical Exam  Constitutional: She is oriented to person, place, and time and well-developed, well-nourished, and in no distress.  HENT:  Head: Normocephalic and atraumatic.  Eyes: Conjunctivae are normal.  Neck: Neck supple. No thyromegaly present.  Cardiovascular: Normal rate, regular rhythm, normal heart sounds and intact distal pulses.   Pulmonary/Chest: Effort normal and breath sounds normal. No respiratory distress. She has no wheezes. She has no rales. She exhibits no tenderness.  Neurological: She is alert and oriented to person, place, and time. No cranial nerve deficit.  Skin: Skin is warm and dry. No rash noted.  Vitals reviewed.   Recent Results (from the past 2160 hour(s))  Hemoglobin A1c     Status: Abnormal   Collection Time: 01/18/16 11:21 AM  Result Value Ref Range   Hgb A1c MFr Bld 7.8 (H) 4.6 - 6.5 %    Comment:  Glycemic Control Guidelines for People with Diabetes:Non Diabetic:  <6%Goal of Therapy: <7%Additional Action Suggested:  >8%   Comp Met (CMET)     Status: Abnormal   Collection Time: 01/18/16 11:21 AM  Result Value Ref Range   Sodium 136 135 - 145 mEq/L   Potassium 5.4 (H) 3.5 - 5.1 mEq/L   Chloride 102 96 - 112 mEq/L   CO2 26 19 - 32 mEq/L   Glucose, Bld 211 (H) 70 - 99 mg/dL   BUN 32 (H) 6 - 23 mg/dL   Creatinine, Ser 1.03 0.40 - 1.20 mg/dL   Total Bilirubin 0.3 0.2 - 1.2 mg/dL  Alkaline Phosphatase 100 39 - 117 U/L   AST 9 0 - 37 U/L   ALT 10 0 - 35 U/L   Total Protein 8.0 6.0 - 8.3 g/dL   Albumin 3.5 3.5 - 5.2 g/dL   Calcium 9.1 8.4 - 10.5 mg/dL   GFR 58.30 (L) >60.00 mL/min  Comprehensive metabolic panel     Status: Abnormal   Collection Time: 02/03/16  4:35 PM  Result Value Ref Range   Sodium 132 (L) 135 - 145 mmol/L   Potassium 4.4 3.5 - 5.1 mmol/L   Chloride 100 (L) 101 - 111 mmol/L   CO2 21 (L) 22 - 32 mmol/L   Glucose, Bld 257 (H) 65 - 99 mg/dL   BUN 45 (H) 6 - 20 mg/dL   Creatinine, Ser 1.32 (H) 0.44 - 1.00 mg/dL   Calcium 8.9 8.9 - 10.3 mg/dL   Total Protein 7.6 6.5 - 8.1 g/dL   Albumin 2.3 (L) 3.5 - 5.0 g/dL   AST 17 15 - 41 U/L   ALT 21 14 - 54 U/L   Alkaline Phosphatase 207 (H) 38 - 126 U/L   Total Bilirubin 1.0 0.3 - 1.2 mg/dL   GFR calc non Af Amer 43 (L) >60 mL/min   GFR calc Af Amer 50 (L) >60 mL/min    Comment: (NOTE) The eGFR has been calculated using the CKD EPI equation. This calculation has not been validated in all clinical situations. eGFR's persistently <60 mL/min signify possible Chronic Kidney Disease.    Anion gap 11 5 - 15  CBC with Differential     Status: Abnormal   Collection Time: 02/03/16  4:35 PM  Result Value Ref Range   WBC 12.5 (H) 4.0 - 10.5 K/uL   RBC 2.43 (L) 3.87 - 5.11 MIL/uL   Hemoglobin 7.1 (L) 12.0 - 15.0 g/dL   HCT 21.7 (L) 36.0 - 46.0 %   MCV 89.3 78.0 - 100.0 fL   MCH 29.2 26.0 - 34.0 pg   MCHC 32.7 30.0 - 36.0  g/dL   RDW 13.6 11.5 - 15.5 %   Platelets 503 (H) 150 - 400 K/uL   Neutrophils Relative % 80 %   Lymphocytes Relative 6 %   Monocytes Relative 6 %   Eosinophils Relative 0 %   Basophils Relative 0 %   Band Neutrophils 7 %   Metamyelocytes Relative 1 %   Neutro Abs 10.9 (H) 1.7 - 7.7 K/uL   Lymphs Abs 0.8 0.7 - 4.0 K/uL   Monocytes Absolute 0.8 0.1 - 1.0 K/uL   Eosinophils Absolute 0.0 0.0 - 0.7 K/uL   Basophils Absolute 0.0 0.0 - 0.1 K/uL  Culture, blood (routine x 2)     Status: Abnormal   Collection Time: 02/03/16  5:45 PM  Result Value Ref Range   Specimen Description BLOOD LEFT ANTECUBITAL    Special Requests BOTTLES DRAWN AEROBIC AND ANAEROBIC 5CC    Culture  Setup Time      GRAM POSITIVE COCCI IN CLUSTERS IN BOTH AEROBIC AND ANAEROBIC BOTTLES CRITICAL RESULT CALLED TO, READ BACK BY AND VERIFIED WITH: M. BELL, PHARMD AT 1640 ON 02/04/16 BY C. JESSUP, MLT. Performed at [facility] (A)    Report Status 02/06/2016 FINAL    Organism ID, Bacteria STAPHYLOCOCCUS AUREUS       Susceptibility   Staphylococcus aureus - MIC*    CIPROFLOXACIN <=0.5 SENSITIVE Sensitive     ERYTHROMYCIN <=0.25 SENSITIVE Sensitive  GENTAMICIN <=0.5 SENSITIVE Sensitive     OXACILLIN <=0.25 SENSITIVE Sensitive     TETRACYCLINE <=1 SENSITIVE Sensitive     VANCOMYCIN 1 SENSITIVE Sensitive     TRIMETH/SULFA <=10 SENSITIVE Sensitive     CLINDAMYCIN <=0.25 SENSITIVE Sensitive     RIFAMPIN <=0.5 SENSITIVE Sensitive     Inducible Clindamycin NEGATIVE Sensitive     * STAPHYLOCOCCUS AUREUS  Blood Culture ID Panel (Reflexed)     Status: Abnormal   Collection Time: 02/03/16  5:45 PM  Result Value Ref Range   Enterococcus species NOT DETECTED NOT DETECTED   Listeria monocytogenes NOT DETECTED NOT DETECTED   Staphylococcus species DETECTED (A) NOT DETECTED    Comment: CRITICAL RESULT CALLED TO, READ BACK BY AND VERIFIED WITH: M. BELL, PHARMD AT 1640 ON 02/04/16 BY C.  JESSUP, MLT.    Staphylococcus aureus DETECTED (A) NOT DETECTED    Comment: CRITICAL RESULT CALLED TO, READ BACK BY AND VERIFIED WITH: M. BELL, PHARMD AT 1640 ON 02/04/16 BY C. JESSUP, MLT.    Methicillin resistance NOT DETECTED NOT DETECTED   Streptococcus species NOT DETECTED NOT DETECTED   Streptococcus agalactiae NOT DETECTED NOT DETECTED   Streptococcus pneumoniae NOT DETECTED NOT DETECTED   Streptococcus pyogenes NOT DETECTED NOT DETECTED   Acinetobacter baumannii NOT DETECTED NOT DETECTED   Enterobacteriaceae species NOT DETECTED NOT DETECTED   Enterobacter cloacae complex NOT DETECTED NOT DETECTED   Escherichia coli NOT DETECTED NOT DETECTED   Klebsiella oxytoca NOT DETECTED NOT DETECTED   Klebsiella pneumoniae NOT DETECTED NOT DETECTED   Proteus species NOT DETECTED NOT DETECTED   Serratia marcescens NOT DETECTED NOT DETECTED   Haemophilus influenzae NOT DETECTED NOT DETECTED   Neisseria meningitidis NOT DETECTED NOT DETECTED   Pseudomonas aeruginosa NOT DETECTED NOT DETECTED   Candida albicans NOT DETECTED NOT DETECTED   Candida glabrata NOT DETECTED NOT DETECTED   Candida krusei NOT DETECTED NOT DETECTED   Candida parapsilosis NOT DETECTED NOT DETECTED   Candida tropicalis NOT DETECTED NOT DETECTED    Comment: Performed at [facility]  Urinalysis, Routine w reflex microscopic     Status: Abnormal   Collection Time: 02/03/16  6:00 PM  Result Value Ref Range   Color, Urine AMBER (A) YELLOW    Comment: BIOCHEMICALS MAY BE AFFECTED BY COLOR   APPearance CLOUDY (A) CLEAR   Specific Gravity, Urine 1.020 1.005 - 1.030   pH 5.5 5.0 - 8.0   Glucose, UA 100 (A) NEGATIVE mg/dL   Hgb urine dipstick LARGE (A) NEGATIVE   Bilirubin Urine SMALL (A) NEGATIVE   Ketones, ur 15 (A) NEGATIVE mg/dL   Protein, ur 100 (A) NEGATIVE mg/dL   Nitrite NEGATIVE NEGATIVE   Leukocytes, UA SMALL (A) NEGATIVE  Urine microscopic-add on     Status: Abnormal   Collection Time: 02/03/16  6:00  PM  Result Value Ref Range   Squamous Epithelial / LPF 0-5 (A) NONE SEEN   WBC, UA 6-30 0 - 5 WBC/hpf   RBC / HPF 0-5 0 - 5 RBC/hpf   Bacteria, UA FEW (A) NONE SEEN   Casts GRANULAR CAST (A) NEGATIVE   Urine-Other AMORPHOUS URATES/PHOSPHATES     Comment: YEAST  Culture, blood (routine x 2)     Status: Abnormal   Collection Time: 02/03/16  6:15 PM  Result Value Ref Range   Specimen Description BLOOD RIGHT ARM    Special Requests BOTTLES DRAWN AEROBIC AND ANAEROBIC 10CC    Culture  Setup Time  GRAM POSITIVE COCCI IN CLUSTERS IN BOTH AEROBIC AND ANAEROBIC BOTTLES CRITICAL VALUE NOTED.  VALUE IS CONSISTENT WITH PREVIOUSLY REPORTED AND CALLED VALUE.    Culture (A)     STAPHYLOCOCCUS AUREUS SUSCEPTIBILITIES PERFORMED ON PREVIOUS CULTURE WITHIN THE LAST 5 DAYS. Performed at [facility]    Report Status 02/06/2016 FINAL   I-Stat CG4 Lactic Acid, ED     Status: None   Collection Time: 02/03/16  6:28 PM  Result Value Ref Range   Lactic Acid, Venous 0.62 0.5 - 1.9 mmol/L  CBG monitoring, ED     Status: Abnormal   Collection Time: 02/03/16  8:17 PM  Result Value Ref Range   Glucose-Capillary 215 (H) 65 - 99 mg/dL  Glucose, capillary     Status: Abnormal   Collection Time: 02/03/16  9:31 PM  Result Value Ref Range   Glucose-Capillary 196 (H) 65 - 99 mg/dL  Type and screen [place]     Status: None   Collection Time: 02/04/16  7:17 AM  Result Value Ref Range   ABO/RH(D) B POS    Antibody Screen NEG    Sample Expiration 02/07/2016    Unit Number [id number]    Blood Component Type RED CELLS,LR    Unit division 00    Status of Unit ISSUED,FINAL    Transfusion Status OK TO TRANSFUSE    Crossmatch Result Compatible    Unit Number [id number]    Blood Component Type RED CELLS,LR    Unit division 00    Status of Unit ISSUED,FINAL    Transfusion Status OK TO TRANSFUSE    Crossmatch Result Compatible    Unit Number [id number]    Blood Component  Type RED CELLS,LR    Unit division 00    Status of Unit ISSUED,FINAL    Transfusion Status OK TO TRANSFUSE    Crossmatch Result Compatible    Unit Number [id number]    Blood Component Type RED CELLS,LR    Unit division 00    Status of Unit ISSUED,FINAL    Transfusion Status OK TO TRANSFUSE    Crossmatch Result Compatible   Vitamin B12     Status: Abnormal   Collection Time: 02/04/16  7:17 AM  Result Value Ref Range   Vitamin B-12 1,066 (H) 180 - 914 pg/mL    Comment: (NOTE) This assay is not validated for testing neonatal or myeloproliferative syndrome specimens for Vitamin B12 levels.   Folate     Status: None   Collection Time: 02/04/16  7:17 AM  Result Value Ref Range   Folate 22.1 >5.9 ng/mL  Iron and TIBC     Status: Abnormal   Collection Time: 02/04/16  7:17 AM  Result Value Ref Range   Iron 17 (L) 28 - 170 ug/dL   TIBC 132 (L) 250 - 450 ug/dL   Saturation Ratios 13 10.4 - 31.8 %   UIBC 115 ug/dL  Ferritin     Status: Abnormal   Collection Time: 02/04/16  7:17 AM  Result Value Ref Range   Ferritin 1,105 (H) 11 - 307 ng/mL  Reticulocytes     Status: Abnormal   Collection Time: 02/04/16  7:17 AM  Result Value Ref Range   Retic Ct Pct 1.3 0.4 - 3.1 %   RBC. 1.95 (L) 3.87 - 5.11 MIL/uL   Retic Count, Manual 25.4 19.0 - 186.0 K/uL  ABO/Rh     Status: None   Collection Time: 02/04/16  7:17 AM  Result  Value Ref Range   ABO/RH(D) B POS   Glucose, capillary     Status: Abnormal   Collection Time: 02/04/16  7:51 AM  Result Value Ref Range   Glucose-Capillary 261 (H) 65 - 99 mg/dL  Respiratory Panel by PCR     Status: None   Collection Time: 02/04/16  8:57 AM  Result Value Ref Range   Adenovirus NOT DETECTED NOT DETECTED   Coronavirus 229E NOT DETECTED NOT DETECTED   Coronavirus HKU1 NOT DETECTED NOT DETECTED   Coronavirus NL63 NOT DETECTED NOT DETECTED   Coronavirus OC43 NOT DETECTED NOT DETECTED   Metapneumovirus NOT DETECTED NOT DETECTED   Rhinovirus /  Enterovirus NOT DETECTED NOT DETECTED   Influenza A NOT DETECTED NOT DETECTED   Influenza B NOT DETECTED NOT DETECTED   Parainfluenza Virus 1 NOT DETECTED NOT DETECTED   Parainfluenza Virus 2 NOT DETECTED NOT DETECTED   Parainfluenza Virus 3 NOT DETECTED NOT DETECTED   Parainfluenza Virus 4 NOT DETECTED NOT DETECTED   Respiratory Syncytial Virus NOT DETECTED NOT DETECTED   Bordetella pertussis NOT DETECTED NOT DETECTED   Chlamydophila pneumoniae NOT DETECTED NOT DETECTED   Mycoplasma pneumoniae NOT DETECTED NOT DETECTED  Glucose, capillary     Status: Abnormal   Collection Time: 02/04/16 12:12 PM  Result Value Ref Range   Glucose-Capillary 187 (H) 65 - 99 mg/dL  Glucose, capillary     Status: Abnormal   Collection Time: 02/04/16  5:27 PM  Result Value Ref Range   Glucose-Capillary 161 (H) 65 - 99 mg/dL  Glucose, capillary     Status: Abnormal   Collection Time: 02/04/16  8:23 PM  Result Value Ref Range   Glucose-Capillary 165 (H) 65 - 99 mg/dL  CBC WITH DIFFERENTIAL     Status: Abnormal   Collection Time: 02/05/16  5:53 AM  Result Value Ref Range   WBC 9.8 4.0 - 10.5 K/uL   RBC 1.94 (L) 3.87 - 5.11 MIL/uL   Hemoglobin 5.6 (LL) 12.0 - 15.0 g/dL    Comment: REPEATED TO VERIFY CRITICAL RESULT CALLED TO, READ BACK BY AND VERIFIED WITH: L CARMAN,RN [id number] WILDERK    HCT 17.5 (L) 36.0 - 46.0 %   MCV 90.2 78.0 - 100.0 fL   MCH 28.9 26.0 - 34.0 pg   MCHC 32.0 30.0 - 36.0 g/dL   RDW 14.9 11.5 - 15.5 %   Platelets 399 150 - 400 K/uL   Neutrophils Relative % 66 %   Neutro Abs 6.5 1.7 - 7.7 K/uL   Lymphocytes Relative 21 %   Lymphs Abs 2.0 0.7 - 4.0 K/uL   Monocytes Relative 10 %   Monocytes Absolute 1.0 0.1 - 1.0 K/uL   Eosinophils Relative 3 %   Eosinophils Absolute 0.2 0.0 - 0.7 K/uL   Basophils Relative 0 %   Basophils Absolute 0.0 0.0 - 0.1 K/uL  Basic metabolic panel     Status: Abnormal   Collection Time: 02/05/16  5:53 AM  Result Value Ref Range   Sodium 135 135 -  145 mmol/L   Potassium 4.4 3.5 - 5.1 mmol/L   Chloride 105 101 - 111 mmol/L   CO2 23 22 - 32 mmol/L   Glucose, Bld 144 (H) 65 - 99 mg/dL   BUN 23 (H) 6 - 20 mg/dL   Creatinine, Ser 1.02 (H) 0.44 - 1.00 mg/dL   Calcium 8.2 (L) 8.9 - 10.3 mg/dL   GFR calc non Af Amer 59 (L) >60 mL/min  GFR calc Af Amer >60 >60 mL/min    Comment: (NOTE) The eGFR has been calculated using the CKD EPI equation. This calculation has not been validated in all clinical situations. eGFR's persistently <60 mL/min signify possible Chronic Kidney Disease.    Anion gap 7 5 - 15  Glucose, capillary     Status: Abnormal   Collection Time: 02/05/16  7:29 AM  Result Value Ref Range   Glucose-Capillary 137 (H) 65 - 99 mg/dL  Prepare RBC     Status: None   Collection Time: 02/05/16  7:36 AM  Result Value Ref Range   Order Confirmation ORDER PROCESSED BY BLOOD BANK   Glucose, capillary     Status: Abnormal   Collection Time: 02/05/16 11:48 AM  Result Value Ref Range   Glucose-Capillary 133 (H) 65 - 99 mg/dL  Glucose, capillary     Status: Abnormal   Collection Time: 02/05/16  4:50 PM  Result Value Ref Range   Glucose-Capillary 132 (H) 65 - 99 mg/dL  Culture, blood (Routine X 2) w Reflex to ID Panel     Status: None   Collection Time: 02/05/16  8:11 PM  Result Value Ref Range   Specimen Description BLOOD LEFT HAND    Special Requests IN PEDIATRIC BOTTLE 3CC    Culture NO GROWTH 5 DAYS    Report Status 02/10/2016 FINAL   Hemoglobin and hematocrit, blood     Status: Abnormal   Collection Time: 02/05/16  8:14 PM  Result Value Ref Range   Hemoglobin 6.4 (LL) 12.0 - 15.0 g/dL    Comment: REPEATED TO VERIFY POST TRANSFUSION SPECIMEN CRITICAL VALUE NOTED.  VALUE IS CONSISTENT WITH PREVIOUSLY REPORTED AND CALLED VALUE.    HCT 19.6 (L) 36.0 - 46.0 %  HIV antibody     Status: None   Collection Time: 02/05/16  8:14 PM  Result Value Ref Range   HIV Screen 4th Generation wRfx Non Reactive Non Reactive    Comment:  (NOTE) Performed At: [facility] [place], [place] [id number] Lindon Romp MD FX:[id number]   Prepare RBC     Status: None   Collection Time: 02/05/16  9:08 PM  Result Value Ref Range   Order Confirmation ORDER PROCESSED BY BLOOD BANK   Glucose, capillary     Status: Abnormal   Collection Time: 02/05/16  9:27 PM  Result Value Ref Range   Glucose-Capillary 155 (H) 65 - 99 mg/dL  Glucose, capillary     Status: Abnormal   Collection Time: 02/06/16  8:28 AM  Result Value Ref Range   Glucose-Capillary 112 (H) 65 - 99 mg/dL  Glucose, capillary     Status: Abnormal   Collection Time: 02/06/16 12:33 PM  Result Value Ref Range   Glucose-Capillary 142 (H) 65 - 99 mg/dL  Glucose, capillary     Status: Abnormal   Collection Time: 02/06/16  5:25 PM  Result Value Ref Range   Glucose-Capillary 119 (H) 65 - 99 mg/dL  Glucose, capillary     Status: Abnormal   Collection Time: 02/06/16 10:09 PM  Result Value Ref Range   Glucose-Capillary 112 (H) 65 - 99 mg/dL  CBC     Status: Abnormal   Collection Time: 02/07/16  2:44 AM  Result Value Ref Range   WBC 11.8 (H) 4.0 - 10.5 K/uL   RBC 3.40 (L) 3.87 - 5.11 MIL/uL   Hemoglobin 10.0 (L) 12.0 - 15.0 g/dL    Comment: DELTA CHECK NOTED REPEATED TO  VERIFY POST TRANSFUSION SPECIMEN    HCT 30.1 (L) 36.0 - 46.0 %   MCV 88.5 78.0 - 100.0 fL   MCH 29.1 26.0 - 34.0 pg   MCHC 32.9 30.0 - 36.0 g/dL   RDW 15.0 11.5 - 15.5 %   Platelets 455 (H) 150 - 400 K/uL  Glucose, capillary     Status: None   Collection Time: 02/07/16  8:07 AM  Result Value Ref Range   Glucose-Capillary 73 65 - 99 mg/dL  Glucose, capillary     Status: Abnormal   Collection Time: 02/07/16 11:36 AM  Result Value Ref Range   Glucose-Capillary 102 (H) 65 - 99 mg/dL  Sedimentation rate     Status: Abnormal   Collection Time: 02/07/16  4:40 PM  Result Value Ref Range   Sed Rate 130 (H) 0 - 22 mm/hr  C-reactive protein     Status: Abnormal   Collection  Time: 02/07/16  4:40 PM  Result Value Ref Range   CRP 17.7 (H) <1.0 mg/dL  Renal function panel     Status: Abnormal   Collection Time: 02/07/16  4:40 PM  Result Value Ref Range   Sodium 133 (L) 135 - 145 mmol/L   Potassium 4.1 3.5 - 5.1 mmol/L   Chloride 100 (L) 101 - 111 mmol/L   CO2 23 22 - 32 mmol/L   Glucose, Bld 119 (H) 65 - 99 mg/dL   BUN 13 6 - 20 mg/dL   Creatinine, Ser 0.92 0.44 - 1.00 mg/dL   Calcium 8.7 (L) 8.9 - 10.3 mg/dL   Phosphorus 3.9 2.5 - 4.6 mg/dL   Albumin 1.8 (L) 3.5 - 5.0 g/dL   GFR calc non Af Amer >60 >60 mL/min   GFR calc Af Amer >60 >60 mL/min    Comment: (NOTE) The eGFR has been calculated using the CKD EPI equation. This calculation has not been validated in all clinical situations. eGFR's persistently <60 mL/min signify possible Chronic Kidney Disease.    Anion gap 10 5 - 15  Glucose, capillary     Status: Abnormal   Collection Time: 02/07/16  5:20 PM  Result Value Ref Range   Glucose-Capillary 119 (H) 65 - 99 mg/dL  Glucose, capillary     Status: Abnormal   Collection Time: 02/07/16  9:16 PM  Result Value Ref Range   Glucose-Capillary 115 (H) 65 - 99 mg/dL  Glucose, capillary     Status: None   Collection Time: 02/08/16  7:42 AM  Result Value Ref Range   Glucose-Capillary 91 65 - 99 mg/dL  ECHOCARDIOGRAM COMPLETE     Status: None   Collection Time: 02/08/16  9:13 AM  Result Value Ref Range   Weight 2,859.2 oz   Height 61 in   BP 140/66 mmHg  Glucose, capillary     Status: Abnormal   Collection Time: 02/08/16 11:12 AM  Result Value Ref Range   Glucose-Capillary 111 (H) 65 - 99 mg/dL  Comp Met (CMET)     Status: Abnormal   Collection Time: 02/12/16  1:34 PM  Result Value Ref Range   Sodium 137 135 - 145 mEq/L   Potassium 5.6 (H) 3.5 - 5.1 mEq/L   Chloride 99 96 - 112 mEq/L   CO2 30 19 - 32 mEq/L   Glucose, Bld 123 (H) 70 - 99 mg/dL   BUN 37 (H) 6 - 23 mg/dL   Creatinine, Ser 1.00 0.40 - 1.20 mg/dL   Total Bilirubin 0.4 0.2 - 1.2  mg/dL   Alkaline Phosphatase 174 (H) 39 - 117 U/L   AST 14 0 - 37 U/L   ALT 11 0 - 35 U/L   Total Protein 7.7 6.0 - 8.3 g/dL   Albumin 2.8 (L) 3.5 - 5.2 g/dL   Calcium 9.5 8.4 - 10.5 mg/dL   GFR 60.30 >60.00 mL/min  CBC     Status: Abnormal   Collection Time: 02/12/16  1:34 PM  Result Value Ref Range   WBC 13.9 (H) 4.0 - 10.5 K/uL   RBC 3.78 (L) 3.87 - 5.11 Mil/uL   Platelets 686.0 (H) 150.0 - 400.0 K/uL   Hemoglobin 11.2 (L) 12.0 - 15.0 g/dL   HCT 33.7 (L) 36.0 - 46.0 %   MCV 89.1 78.0 - 100.0 fl   MCHC 33.3 30.0 - 36.0 g/dL   RDW 14.8 11.5 - 15.5 %  POCT urinalysis dipstick     Status: None   Collection Time: 02/12/16  1:50 PM  Result Value Ref Range   Color, UA amber    Clarity, UA cloudy    Glucose, UA neg    Bilirubin, UA neg    Ketones, UA positive    Spec Grav, UA >=1.030    Blood, UA small +1    pH, UA 5.0    Protein, UA positive +3    Urobilinogen, UA 0.2    Nitrite, UA neg    Leukocytes, UA Negative Negative  CULTURE, URINE COMPREHENSIVE     Status: None   Collection Time: 02/12/16  2:10 PM  Result Value Ref Range   Organism ID, Bacteria NO GROWTH   CBC w/Diff     Status: Abnormal   Collection Time: 02/14/16  2:19 PM  Result Value Ref Range   WBC 13.3 (H) 3.9 - 10.0 10e3/uL   RBC 3.75 3.70 - 5.32 10e6/uL   HGB 11.1 (L) 11.6 - 15.9 g/dL   HCT 34.7 (L) 34.8 - 46.6 %   MCV 93 81 - 101 fL   MCH 29.6 26.0 - 34.0 pg   MCHC 32.0 32.0 - 36.0 g/dL   RDW 13.7 11.1 - 15.7 %   Platelets 602 (H) 145 - 400 10e3/uL   NEUT# 10.0 (H) 1.5 - 6.5 10e3/uL   LYMPH# 1.9 0.9 - 3.3 10e3/uL   MONO# 0.9 0.1 - 0.9 10e3/uL   Eosinophils Absolute 0.5 0.0 - 0.5 10e3/uL   BASO# 0.0 0.0 - 0.2 10e3/uL   NEUT% 75.2 39.6 - 80.0 %   LYMPH% 14.1 14.0 - 48.0 %   MONO% 6.4 0.0 - 13.0 %   EOS% 4.1 0.0 - 7.0 %   BASO% 0.2 0.0 - 2.0 %  Ferritin     Status: Abnormal   Collection Time: 02/14/16  2:19 PM  Result Value Ref Range   Ferritin 2,360 (H) 9 - 269 ng/ml  Iron and TIBC     Status:  Abnormal   Collection Time: 02/14/16  2:19 PM  Result Value Ref Range   Iron 24 (L) 41 - 142 ug/dL   TIBC 191 (L) 236 - 444 ug/dL   UIBC 167 120 - 384 ug/dL   %SAT 13 (L) 21 - 57 %  Reticulocytes     Status: None   Collection Time: 02/14/16  2:19 PM  Result Value Ref Range   Reticulocyte Count 0.7 0.6 - 2.6 %  Comprehensive metabolic panel     Status: Abnormal   Collection Time: 02/14/16  2:19 PM  Result Value Ref Range  Glucose 188 (H) 65 - 99 mg/dL   BUN 38 (H) 6 - 24 mg/dL   Creatinine, Ser 0.91 0.57 - 1.00 mg/dL   GFR calc non Af Amer 69 >59 mL/min/1.73   GFR calc Af Amer 80 >59 mL/min/1.73   BUN/Creatinine Ratio 42 (H) 9 - 23   Sodium 136 134 - 144 mmol/L   Potassium, Ser 6.3 (H) 3.5 - 5.2 mmol/L    Comment: **Result Repeated**   Chloride, Ser 100 96 - 106 mmol/L   Carbon Dioxide, Total 26 18 - 29 mmol/L   Calcium, Ser 10.6 (H) 8.7 - 10.2 mg/dL   Total Protein 7.9 6.0 - 8.5 g/dL   Albumin, Serum 3.1 (L) 3.5 - 5.5 g/dL   Globulin, Total 4.8 (H) 1.5 - 4.5 g/dL   Albumin/Globulin Ratio 0.6 (L) 1.2 - 2.2   Bilirubin Total 0.3 0.0 - 1.2 mg/dL   Alkaline Phosphatase, S 178 (H) 39 - 117 IU/L   AST (SGOT) 13 0 - 40 IU/L   ALT 10 0 - 32 IU/L  CBC     Status: Abnormal   Collection Time: 04/11/16  2:58 PM  Result Value Ref Range   WBC 11.0 (H) 4.0 - 10.5 K/uL   RBC 2.64 (L) 3.87 - 5.11 Mil/uL   Platelets 820.0 (H) 150.0 - 400.0 K/uL   Hemoglobin 7.5 Repeated and verified X2. (LL) 12.0 - 15.0 g/dL   HCT 22.5 Repeated and verified X2. (LL) 36.0 - 46.0 %   MCV 85.3 78.0 - 100.0 fl   MCHC 33.2 30.0 - 36.0 g/dL   RDW 15.3 11.5 - 46.8 %  Basic metabolic panel     Status: Abnormal   Collection Time: 04/11/16  2:58 PM  Result Value Ref Range   Sodium 139 135 - 145 mEq/L   Potassium 5.5 (H) 3.5 - 5.1 mEq/L   Chloride 103 96 - 112 mEq/L   CO2 27 19 - 32 mEq/L   Glucose, Bld 149 (H) 70 - 99 mg/dL   BUN 42 (H) 6 - 23 mg/dL   Creatinine, Ser 1.01 0.40 - 1.20 mg/dL   Calcium 9.1  8.4 - 10.5 mg/dL   GFR 59.58 (L) >60.00 mL/min  INR/PT     Status: Abnormal   Collection Time: 04/11/16  2:58 PM  Result Value Ref Range   INR 1.2 (H) 0.8 - 1.0 ratio   Prothrombin Time 12.9 9.6 - 13.1 sec    Assessment/Plan: 1. Pre-operative clearance Vitals stable. Exam good today. EKG obtained revealing NSR with RBBB. No acute changes. Labs obtained today including BMP, CBC and PT. Clearance pending lab results.   Addendum - Critical Hgb noted at 7.5. Patient sent to ER for transfusion and further assessment. Cannot grant surgical clearance at this time until patient is hemodynamically stable. Will need reassessment.   - EKG 12-Lead - CBC - Basic metabolic panel - INR/PT - EKG 12-Lead   [name], PA-C

## 2016-04-11 NOTE — Telephone Encounter (Signed)
Spoke with patient at home. She is feeling well. No c/o SOB, lightheadedness or dizziness. She is aware of instructions and they are being taken to the closest ER for assessment.  Will keep an eye on ER assessment. Hopeful some sort of lab error but she does have history of anemia although has been stable recently.

## 2016-04-11 NOTE — Progress Notes (Signed)
Pre visit review using our clinic review tool, if applicable. No additional management support is needed unless otherwise documented below in the visit note. 

## 2016-04-11 NOTE — Patient Instructions (Signed)
Please go to the lab for blood work. I will call with your results and to let you know if clearance is granted.  Your exam was good today. EKG shows some changes but nothing that would keep you from surgery. I am having Cardiology take a look as well to verify.

## 2016-04-11 NOTE — Telephone Encounter (Signed)
Called and LMOVM advising to call the office back ASAP due to critical labs.

## 2016-04-11 NOTE — Telephone Encounter (Signed)
Please call patient and inform her of this critical result. Needs urgent assessment. I would recommend since she was asymptomatic at visit but has history of severe iron and EPO anemia, and it is the weekend, that she go to the ER for assessment, fluids and consideration for transfusion as she is just above normal threshold. Would need this done as they will not do her surgery if her hgb is not more stable.

## 2016-04-11 NOTE — Telephone Encounter (Signed)
Santiago Glad calling from [facility] with Critical lab: Hgb: 7.5 Hct: 22.5

## 2016-04-11 NOTE — Telephone Encounter (Signed)
Called patient as well. No answer. Left detailed message on machine recommending ER assessment immediately giving critical hemoglobin level.  Left my cell number for them to call me back this evening. Will continue to reach patient this evening. No other number on file to call.   Patient's BP and pulse were normal at visit. She was doing well and EKG/exam looked good overall. Patient with history of EPO and iron deficiency anemia. Followed by hematology. Has FU next week. Giving levels, history and need for upcoming amputation, she needs immediate assessment in the ER.

## 2016-04-12 DIAGNOSIS — D473 Essential (hemorrhagic) thrombocythemia: Secondary | ICD-10-CM | POA: Diagnosis not present

## 2016-04-12 DIAGNOSIS — D649 Anemia, unspecified: Secondary | ICD-10-CM | POA: Diagnosis not present

## 2016-04-12 DIAGNOSIS — M86271 Subacute osteomyelitis, right ankle and foot: Secondary | ICD-10-CM | POA: Diagnosis not present

## 2016-04-12 DIAGNOSIS — E119 Type 2 diabetes mellitus without complications: Secondary | ICD-10-CM | POA: Diagnosis not present

## 2016-04-12 DIAGNOSIS — Z794 Long term (current) use of insulin: Secondary | ICD-10-CM | POA: Diagnosis not present

## 2016-04-14 ENCOUNTER — Telehealth: Payer: Self-pay | Admitting: Physician Assistant

## 2016-04-14 ENCOUNTER — Other Ambulatory Visit: Payer: Self-pay | Admitting: Family

## 2016-04-14 DIAGNOSIS — M869 Osteomyelitis, unspecified: Secondary | ICD-10-CM | POA: Diagnosis not present

## 2016-04-14 DIAGNOSIS — D631 Anemia in chronic kidney disease: Secondary | ICD-10-CM

## 2016-04-14 DIAGNOSIS — K909 Intestinal malabsorption, unspecified: Secondary | ICD-10-CM

## 2016-04-14 NOTE — Telephone Encounter (Signed)
LMOVM for return call.  

## 2016-04-14 NOTE — Telephone Encounter (Signed)
Please call patient to see how she is feeling after ER visit. I will fax over form to her surgeon that states all was good at exam but we cannot clear her for surgery until she follows up with Dr. Marin Olp. He will have to give clearance from a hemodynamic standpoint. I know she is seeing him Wednesday.

## 2016-04-16 ENCOUNTER — Other Ambulatory Visit (HOSPITAL_BASED_OUTPATIENT_CLINIC_OR_DEPARTMENT_OTHER): Payer: PPO

## 2016-04-16 ENCOUNTER — Ambulatory Visit (HOSPITAL_BASED_OUTPATIENT_CLINIC_OR_DEPARTMENT_OTHER): Payer: PPO | Admitting: Family

## 2016-04-16 ENCOUNTER — Ambulatory Visit (HOSPITAL_BASED_OUTPATIENT_CLINIC_OR_DEPARTMENT_OTHER): Payer: PPO

## 2016-04-16 VITALS — BP 118/63 | HR 70 | Temp 97.6°F | Resp 20 | Wt 160.1 lb

## 2016-04-16 DIAGNOSIS — K909 Intestinal malabsorption, unspecified: Secondary | ICD-10-CM | POA: Diagnosis not present

## 2016-04-16 DIAGNOSIS — E1169 Type 2 diabetes mellitus with other specified complication: Secondary | ICD-10-CM

## 2016-04-16 DIAGNOSIS — N189 Chronic kidney disease, unspecified: Secondary | ICD-10-CM | POA: Diagnosis not present

## 2016-04-16 DIAGNOSIS — D509 Iron deficiency anemia, unspecified: Secondary | ICD-10-CM

## 2016-04-16 DIAGNOSIS — D631 Anemia in chronic kidney disease: Secondary | ICD-10-CM

## 2016-04-16 DIAGNOSIS — D638 Anemia in other chronic diseases classified elsewhere: Secondary | ICD-10-CM

## 2016-04-16 DIAGNOSIS — D508 Other iron deficiency anemias: Secondary | ICD-10-CM

## 2016-04-16 LAB — CBC WITH DIFFERENTIAL (CANCER CENTER ONLY)
BASO#: 0.1 10*3/uL (ref 0.0–0.2)
BASO%: 0.6 % (ref 0.0–2.0)
EOS ABS: 0.4 10*3/uL (ref 0.0–0.5)
EOS%: 4.9 % (ref 0.0–7.0)
HEMATOCRIT: 29.3 % — AB (ref 34.8–46.6)
HEMOGLOBIN: 9.3 g/dL — AB (ref 11.6–15.9)
LYMPH#: 1.8 10*3/uL (ref 0.9–3.3)
LYMPH%: 22.3 % (ref 14.0–48.0)
MCH: 29 pg (ref 26.0–34.0)
MCHC: 31.7 g/dL — ABNORMAL LOW (ref 32.0–36.0)
MCV: 91 fL (ref 81–101)
MONO#: 0.5 10*3/uL (ref 0.1–0.9)
MONO%: 5.8 % (ref 0.0–13.0)
NEUT%: 66.4 % (ref 39.6–80.0)
NEUTROS ABS: 5.5 10*3/uL (ref 1.5–6.5)
Platelets: 664 10*3/uL — ABNORMAL HIGH (ref 145–400)
RBC: 3.21 10*6/uL — AB (ref 3.70–5.32)
RDW: 13.9 % (ref 11.1–15.7)
WBC: 8.2 10*3/uL (ref 3.9–10.0)

## 2016-04-16 LAB — COMPREHENSIVE METABOLIC PANEL
ALBUMIN: 2.4 g/dL — AB (ref 3.5–5.0)
ALK PHOS: 197 U/L — AB (ref 40–150)
ALT: 8 U/L (ref 0–55)
AST: 10 U/L (ref 5–34)
Anion Gap: 11 mEq/L (ref 3–11)
BILIRUBIN TOTAL: 0.29 mg/dL (ref 0.20–1.20)
BUN: 37.9 mg/dL — ABNORMAL HIGH (ref 7.0–26.0)
CO2: 26 meq/L (ref 22–29)
CREATININE: 1 mg/dL (ref 0.6–1.1)
Calcium: 9.9 mg/dL (ref 8.4–10.4)
Chloride: 103 mEq/L (ref 98–109)
EGFR: 62 mL/min/{1.73_m2} — ABNORMAL LOW (ref >90)
GLUCOSE: 125 mg/dL (ref 70–140)
Potassium: 5 mEq/L (ref 3.5–5.1)
SODIUM: 139 meq/L (ref 136–145)
TOTAL PROTEIN: 8.6 g/dL — AB (ref 6.4–8.3)

## 2016-04-16 LAB — CHCC SATELLITE - SMEAR

## 2016-04-16 MED ORDER — SODIUM CHLORIDE 0.9 % IV SOLN
510.0000 mg | Freq: Once | INTRAVENOUS | Status: AC
  Administered 2016-04-16: 510 mg via INTRAVENOUS
  Filled 2016-04-16: qty 17

## 2016-04-16 MED ORDER — SODIUM CHLORIDE 0.9 % IV SOLN
Freq: Once | INTRAVENOUS | Status: AC
  Administered 2016-04-16: 15:00:00 via INTRAVENOUS

## 2016-04-16 NOTE — Telephone Encounter (Signed)
Patient has to be cleared by her Hematologist/Oncologist due to recent low hemoglobin levels

## 2016-04-16 NOTE — Patient Instructions (Signed)

## 2016-04-16 NOTE — Progress Notes (Addendum)
Hematology and Oncology Follow Up Visit  Dana [name] [id number] 1956-03-11 60 y.o. 04/16/2016   Principle Diagnosis:  Iron deficiency anemia Poorly controlled diabetes type II  Current Therapy:   IV iron as indicated - last received in July 2017     Interim History:  Dana [name] is here today for a follow-up. She is scheduled to have a right ankle amputation next week with Dr. Len Childs on the 11th secondary to osteomyelitis. Her pre-op lab work reflected anemia with a Hgb of 7.5. We brought her in today for repeat labs and Hgb is now 9.3, MCV of 91 and platelet count of 664. She is asymptomatic with this.  She has had no episodes of bleeding or bruising.  No fever, chills, n/v, cough, rash, dizziness, headache, vision changes, ice cravings, SOB, chest pain, palpitations, abdominal pain or changes in bowel or bladder habits.   No lymphadenopathy found on exam.  She has a good appetite and is staying well hydrated. She states that he blood sugars are much better controlled ranging between 100 and 150. Her weight is stable.  Neuropathy in hands and feet is unchanged.  Medications:    Medication List       Accurate as of 04/16/16  2:14 PM. Always use your most recent med list.          atorvastatin 20 MG tablet Commonly known as:  LIPITOR Take 1 tablet (20 mg total) by mouth at bedtime.   escitalopram 10 MG tablet Commonly known as:  LEXAPRO Take 30 mg by mouth daily.   freestyle lancets Use as instructed to check blood sugar 3 times per day dx code E11.39   FREESTYLE LITE Devi Use to check blood sugar 3 times a day dx code E11.39   gabapentin 600 MG tablet Commonly known as:  NEURONTIN TAKE TWO TABLETS BY MOUTH THREE TIMES DAILY   glucose blood test strip Commonly known as:  FREESTYLE LITE Use as instructed to check blood sugar 3 times a day dx code E11.39   Insulin Glargine 300 UNIT/ML Sopn Commonly known as:  TOUJEO SOLOSTAR Inject 34 Units into the skin daily.   insulin regular 250 units/2.68m (100 units/mL) injection Commonly known as:  NOVOLIN R RELION Take 6 units with breakfast, 8 units with lunch and 8 units with dinner   INSULIN SYRINGE 1CC/29G 29G X 1/2" 1 ML Misc Use 1 per day.   metFORMIN 500 MG tablet Commonly known as:  GLUCOPHAGE Take by mouth 2 (two) times daily with a meal.   OVER THE COUNTER MEDICATION Take 2 each by mouth daily. Centrum-Muli. Vitamin Gummy   V-GO 30 Kit Use one per day       Allergies:  Allergies  Allergen Reactions  . Lithium Nausea And Vomiting and Other (See Comments)    Other reaction(s): Other (See Comments) Can not keep this medication down. It makes her terribly ill. Can not keep this medication down. It makes her terribly ill.  . Ativan [Lorazepam] Other (See Comments)    Other reaction(s): ANAPHYLAXIS Other reaction(s): Other (See Comments) Abnormal behavior Abnormal behavior  . Demerol [Meperidine] Other (See Comments)    Other reaction(s): ANAPHYLAXIS Other reaction(s): Other (See Comments) Abnormal behavior Abnormal behavior  . Oxycodone Other (See Comments)    Other reaction(s): OTHER Abnormal behavior  . Darvocet [Propoxyphene N-Acetaminophen] Itching  . Latex Itching    Other reaction(s): OTHER  . Propoxyphene Itching    Past Medical History, Surgical history, Social history, and Family  History were reviewed and updated.  Review of Systems: All other 10 point review of systems is negative.   Physical Exam:  weight is 160 lb 1.9 oz (72.6 kg). Her temperature is 97.6 F (36.4 C). Her blood pressure is 118/63 and her pulse is 70. Her respiration is 20.   Wt Readings from Last 3 Encounters:  04/16/16 160 lb 1.9 oz (72.6 kg)  04/11/16 162 lb 6 oz (73.7 kg)  04/07/16 164 lb (74.4 kg)    Ocular: Sclerae unicteric, pupils equal, round and reactive to light Ear-nose-throat: Oropharynx clear, dentition fair Lymphatic: No cervical supraclavicular or axillary  adenopathy Lungs no rales or rhonchi, good excursion bilaterally Heart regular rate and rhythm, no murmur appreciated Abd soft, nontender, positive bowel sounds, no liver or spleen tip palpated on exam, no fluid wave  MSK no focal spinal tenderness, no joint edema Neuro: non-focal, well-oriented, appropriate affect Breasts: Deferred  Lab Results  Component Value Date   WBC 8.2 04/16/2016   HGB 9.3 (L) 04/16/2016   HCT 29.3 (L) 04/16/2016   MCV 91 04/16/2016   PLT 664 (H) 04/16/2016   Lab Results  Component Value Date   FERRITIN 2,360 (H) 02/14/2016   IRON 24 (L) 02/14/2016   TIBC 191 (L) 02/14/2016   UIBC 167 02/14/2016   IRONPCTSAT 13 (L) 02/14/2016   Lab Results  Component Value Date   RETICCTPCT 1.3 02/04/2016   RBC 3.21 (L) 04/16/2016   No results found for: KPAFRELGTCHN, LAMBDASER, KAPLAMBRATIO No results found for: IGGSERUM, IGA, IGMSERUM No results found for: Odetta Pink, SPEI   Chemistry      Component Value Date/Time   NA 139 04/11/2016 1458   NA 136 02/14/2016 1419   NA 139 11/07/2015 1336   K 5.5 (H) 04/11/2016 1458   K 6.3 (H) 02/14/2016 1419   K 4.9 11/07/2015 1336   CL 103 04/11/2016 1458   CL 100 02/14/2016 1419   CL 99 09/20/2015 1344   CO2 27 04/11/2016 1458   CO2 26 02/14/2016 1419   CO2 27 11/07/2015 1336   BUN 42 (H) 04/11/2016 1458   BUN 38 (H) 02/14/2016 1419   BUN 26.4 (H) 11/07/2015 1336   CREATININE 1.01 04/11/2016 1458   CREATININE 0.91 02/14/2016 1419   CREATININE 1.0 11/07/2015 1336      Component Value Date/Time   CALCIUM 9.1 04/11/2016 1458   CALCIUM 10.6 (H) 02/14/2016 1419   CALCIUM 9.3 11/07/2015 1336   ALKPHOS 178 (H) 02/14/2016 1419   ALKPHOS 112 11/07/2015 1336   AST 13 02/14/2016 1419   AST 13 11/07/2015 1336   ALT 10 02/14/2016 1419   ALT 12 11/07/2015 1336   BILITOT 0.3 02/14/2016 1419   BILITOT <0.30 11/07/2015 1336     Impression and Plan: Dana [name] is a  very pleasant 60 yo white female with history of iron deficiency anemia and erythropoietin deficiency secondary to uncontrolled diabetes. She is preparing to have a right ankle amputation next week on the 11th. Her HGb is now 9.3 with an MCV of 91 and platelet count of 664. We will give her Feraheme today for her low iron saturation of 13% so she is ready for surgery next week. She will contact us if she develops symptoms of anemia after surgery. She states that they are only planning to keep her over night and that she will go home the next day. He husband is able to bring her in for  supportive treatment if needed.  Medical clearance form for surgery next week faxed to Dr. Juluis Mire office along with CBC report.   We will plan to see her back in 2 months for repeat lab work and follow-up.  Both she and her husband know to contact our office with any questions or concerns. We can certainly see her sooner if need be.   Eliezer Bottom, NP 12/6/20172:14 PM

## 2016-04-17 LAB — IRON AND TIBC
%SAT: 13 % — AB (ref 21–57)
IRON: 26 ug/dL — AB (ref 41–142)
TIBC: 191 ug/dL — ABNORMAL LOW (ref 236–444)
UIBC: 165 ug/dL (ref 120–384)

## 2016-04-17 LAB — FERRITIN: Ferritin: 2650 ng/ml — ABNORMAL HIGH (ref 9–269)

## 2016-04-17 LAB — RETICULOCYTES: Reticulocyte Count: 1 % (ref 0.6–2.6)

## 2016-04-17 LAB — ERYTHROPOIETIN: ERYTHROPOIETIN: 18.1 m[IU]/mL (ref 2.6–18.5)

## 2016-04-18 ENCOUNTER — Telehealth: Payer: Self-pay | Admitting: Physician Assistant

## 2016-04-18 DIAGNOSIS — L97425 Non-pressure chronic ulcer of left heel and midfoot with muscle involvement without evidence of necrosis: Secondary | ICD-10-CM | POA: Diagnosis not present

## 2016-04-18 DIAGNOSIS — L97512 Non-pressure chronic ulcer of other part of right foot with fat layer exposed: Secondary | ICD-10-CM | POA: Diagnosis not present

## 2016-04-18 DIAGNOSIS — L97415 Non-pressure chronic ulcer of right heel and midfoot with muscle involvement without evidence of necrosis: Secondary | ICD-10-CM | POA: Diagnosis not present

## 2016-04-18 DIAGNOSIS — E11621 Type 2 diabetes mellitus with foot ulcer: Secondary | ICD-10-CM | POA: Diagnosis not present

## 2016-04-18 DIAGNOSIS — M86271 Subacute osteomyelitis, right ankle and foot: Secondary | ICD-10-CM | POA: Diagnosis not present

## 2016-04-18 DIAGNOSIS — L97522 Non-pressure chronic ulcer of other part of left foot with fat layer exposed: Secondary | ICD-10-CM | POA: Diagnosis not present

## 2016-04-18 NOTE — Telephone Encounter (Signed)
Pt calling checking status on forms for DMV, pt states they have not received them.

## 2016-04-18 NOTE — Telephone Encounter (Signed)
Refaxed forms to [facility].

## 2016-04-21 DIAGNOSIS — Z794 Long term (current) use of insulin: Secondary | ICD-10-CM | POA: Diagnosis not present

## 2016-04-21 DIAGNOSIS — F419 Anxiety disorder, unspecified: Secondary | ICD-10-CM | POA: Diagnosis not present

## 2016-04-21 DIAGNOSIS — M86171 Other acute osteomyelitis, right ankle and foot: Secondary | ICD-10-CM | POA: Diagnosis not present

## 2016-04-21 DIAGNOSIS — G629 Polyneuropathy, unspecified: Secondary | ICD-10-CM | POA: Diagnosis not present

## 2016-04-21 DIAGNOSIS — G4730 Sleep apnea, unspecified: Secondary | ICD-10-CM | POA: Diagnosis not present

## 2016-04-21 DIAGNOSIS — M86161 Other acute osteomyelitis, right tibia and fibula: Secondary | ICD-10-CM | POA: Diagnosis not present

## 2016-04-21 DIAGNOSIS — Z79899 Other long term (current) drug therapy: Secondary | ICD-10-CM | POA: Diagnosis not present

## 2016-04-21 DIAGNOSIS — M86661 Other chronic osteomyelitis, right tibia and fibula: Secondary | ICD-10-CM | POA: Diagnosis not present

## 2016-04-21 DIAGNOSIS — Z7982 Long term (current) use of aspirin: Secondary | ICD-10-CM | POA: Diagnosis not present

## 2016-04-21 DIAGNOSIS — D473 Essential (hemorrhagic) thrombocythemia: Secondary | ICD-10-CM | POA: Diagnosis not present

## 2016-04-21 DIAGNOSIS — F329 Major depressive disorder, single episode, unspecified: Secondary | ICD-10-CM | POA: Diagnosis not present

## 2016-04-21 DIAGNOSIS — E1169 Type 2 diabetes mellitus with other specified complication: Secondary | ICD-10-CM | POA: Diagnosis not present

## 2016-04-21 DIAGNOSIS — M86671 Other chronic osteomyelitis, right ankle and foot: Secondary | ICD-10-CM | POA: Diagnosis not present

## 2016-04-21 DIAGNOSIS — M869 Osteomyelitis, unspecified: Secondary | ICD-10-CM | POA: Diagnosis not present

## 2016-04-23 DIAGNOSIS — Z89441 Acquired absence of right ankle: Secondary | ICD-10-CM | POA: Diagnosis not present

## 2016-04-23 DIAGNOSIS — M86671 Other chronic osteomyelitis, right ankle and foot: Secondary | ICD-10-CM | POA: Diagnosis not present

## 2016-04-23 DIAGNOSIS — Z4781 Encounter for orthopedic aftercare following surgical amputation: Secondary | ICD-10-CM | POA: Diagnosis not present

## 2016-04-25 DIAGNOSIS — M86271 Subacute osteomyelitis, right ankle and foot: Secondary | ICD-10-CM | POA: Diagnosis not present

## 2016-04-28 DIAGNOSIS — M86271 Subacute osteomyelitis, right ankle and foot: Secondary | ICD-10-CM | POA: Diagnosis not present

## 2016-04-29 DIAGNOSIS — S91329A Laceration with foreign body, unspecified foot, initial encounter: Secondary | ICD-10-CM | POA: Diagnosis not present

## 2016-04-30 ENCOUNTER — Other Ambulatory Visit: Payer: Self-pay | Admitting: Physician Assistant

## 2016-04-30 DIAGNOSIS — Z794 Long term (current) use of insulin: Principal | ICD-10-CM

## 2016-04-30 DIAGNOSIS — E11319 Type 2 diabetes mellitus with unspecified diabetic retinopathy without macular edema: Secondary | ICD-10-CM

## 2016-04-30 DIAGNOSIS — E1165 Type 2 diabetes mellitus with hyperglycemia: Principal | ICD-10-CM

## 2016-04-30 DIAGNOSIS — IMO0002 Reserved for concepts with insufficient information to code with codable children: Secondary | ICD-10-CM

## 2016-05-14 ENCOUNTER — Telehealth: Payer: Self-pay | Admitting: Physician Assistant

## 2016-05-14 NOTE — Telephone Encounter (Signed)
Please call patient to see if she has been scheduled for repeat CT. She needs to not take her Metformin for 24 hours prior to the imaging and for 48 hours after.  Keep close eye on sugar levels. Have her schedule FU with me in office for 2 days after CT scan.

## 2016-05-15 NOTE — Telephone Encounter (Signed)
Spoke with patient advised patient she needed a follow up CT scan. She has not heard anything about getting the CT scheduled. She prefers [facility] location. She is aware to stop the Metformin 24 hrs before her CT scan and 48 hr after. She needs a 2 day follow up appt with Einar Pheasant to discuss results.

## 2016-05-15 NOTE — Telephone Encounter (Signed)
Advised front desk about getting patient scheduled for Ct. CT is getting pre-certified and scheduled

## 2016-05-19 ENCOUNTER — Encounter: Payer: Self-pay | Admitting: Endocrinology

## 2016-05-19 ENCOUNTER — Ambulatory Visit (INDEPENDENT_AMBULATORY_CARE_PROVIDER_SITE_OTHER): Payer: PPO | Admitting: Endocrinology

## 2016-05-19 VITALS — BP 130/70 | HR 78 | Ht 61.0 in

## 2016-05-19 DIAGNOSIS — E1142 Type 2 diabetes mellitus with diabetic polyneuropathy: Secondary | ICD-10-CM | POA: Diagnosis not present

## 2016-05-19 DIAGNOSIS — E1165 Type 2 diabetes mellitus with hyperglycemia: Secondary | ICD-10-CM

## 2016-05-19 DIAGNOSIS — Z89412 Acquired absence of left great toe: Secondary | ICD-10-CM

## 2016-05-19 DIAGNOSIS — G250 Essential tremor: Secondary | ICD-10-CM

## 2016-05-19 DIAGNOSIS — Z794 Long term (current) use of insulin: Secondary | ICD-10-CM

## 2016-05-19 NOTE — Progress Notes (Signed)
Patient ID: Dana [name], female   DOB: 10-04-1955, 61 y.o.   MRN: [id number]           Reason for Appointment: Follow-up for Type 2 Diabetes  Referring physician: Elyn Aquas  History of Present Illness:          Date of diagnosis of type 2 diabetes mellitus: 2006       Background history:   She thinks her blood sugar was 300-400 at the time of diagnosis and she was started on insulin soon after this. She thinks she has been on metformin only for the last 5 years Her A1c history is available since only about 2013 and this had been consistently over 10%  Recent history:    She  has had  poor control of her diabetes with pre-consultation A1c of 13%, was on Levemir  Because of her poor control and need for multiple injections and cost of brand name insulin she was started on the V-go pump after instruction by the nurse educator on 04/11/15  Her A1c as of 9/17 is  7.8, previously 7.5%, no recent labs available  INSULIN regimen is: 35 Levemir at bedtime, regular insulin before meals: 12-17-08  Current blood sugar patterns, daily management and problems identified:  She has gone off the V-go pump because of excessive cost  Also Toujeo was not covered  Even with taking Levemir once a day her blood sugars are fairly similar from morning to evening  She is checking blood sugars somewhat erratically and recently has difficulty checking her sugar because of tremor  She did have mostly higher readings especially in the afternoon last month because of inconsistent diet  She had excellent readings in the last few days except Saturday night when it was 501 probably from eating sweets       was having variable blood sugars on her last visit with usually high postprandial readings and somewhat worsening control    she was told to cut back on regular soft drinks that she was consuming and she has reduced these considerably.  With this she has lost weight also  However  is drinking   1-2 cups of milk a day  Overall her blood sugars are significantly better and less variable compared to her last visit  However she does have some variability in her fasting readings and occasionally may have high readings at bedtime also  No hypoglycemia at any time  She is consistent with doing her boluses at meals  She is unclear about what dose of metformin she is taking; she was told to increase the dose to 1000 mg in the evening previously because of high fasting readings                         Non-insulin hypoglycemic drugs the patient is taking are: Metformin 1g bid Side effects from medications have been: None  Compliance with the medical regimen: Good Hypoglycemia: none   Glucose monitoring:  done 2 times a day         Glucometer:  FreeStyle .      Blood Glucose readings by time of day  Mean values apply above for all meters except median for One Touch  PRE-MEAL Fasting Lunch Dinner Bedtime Overall  Glucose range: 60-195  107, 174  97-192  88-501    Mean/median:     169    Mean values apply above for all meters except median for One Touch  PRE-MEAL Fasting Lunch Dinner Bedtime Overall  Glucose range: 91-212  108-163  104-177  72-239    Mean/median: 146   140  139  141    Self-care:  Typical meal intake: Breakfast is irregular otherwise may have toast and an egg or sausage.  Lunch is a sandwich or cheeseburger, evening meal is meat and 2 vegetables.  For snacks he will have peanut butter crackers in pms Dinner 7 pm     She has regular drinks 0-1/2 per day, milk 1 1/2 cups daily          Dietician visit, most recent: Never               Exercise: none  Weight history: Stable over the last 3-4 years, was 400 pounds about 5 years ago  Wt Readings from Last 3 Encounters:  04/16/16 160 lb 1.9 oz (72.6 kg)  04/11/16 162 lb 6 oz (73.7 kg)  04/07/16 164 lb (74.4 kg)    Glycemic control:   Lab Results  Component Value Date   HGBA1C 7.8 (H) 01/18/2016   HGBA1C  7.5 09/20/2015   HGBA1C 7.1 06/06/2015   Lab Results  Component Value Date   MICROALBUR 4.0 (H) 11/08/2014   LDLCALC 57 03/09/2015   CREATININE 1.0 04/16/2016       Allergies as of 05/19/2016      Reactions   Lithium Nausea And Vomiting, Other (See Comments)   Other reaction(s): Other (See Comments) Can not keep this medication down. It makes her terribly ill. Can not keep this medication down. It makes her terribly ill.   Ativan [lorazepam] Other (See Comments)   Other reaction(s): ANAPHYLAXIS Other reaction(s): Other (See Comments) Abnormal behavior Abnormal behavior   Demerol [meperidine] Other (See Comments)   Other reaction(s): ANAPHYLAXIS Other reaction(s): Other (See Comments) Abnormal behavior Abnormal behavior   Oxycodone Other (See Comments)   Other reaction(s): OTHER Abnormal behavior   Darvocet [propoxyphene N-acetaminophen] Itching   Latex Itching   Other reaction(s): OTHER   Propoxyphene Itching      Medication List       Accurate as of 05/19/16  3:41 PM. Always use your most recent med list.          atorvastatin 20 MG tablet Commonly known as:  LIPITOR Take 1 tablet (20 mg total) by mouth at bedtime.   escitalopram 10 MG tablet Commonly known as:  LEXAPRO Take 30 mg by mouth daily.   freestyle lancets Use as instructed to check blood sugar 3 times per day dx code E11.39   FREESTYLE LITE Devi Use to check blood sugar 3 times a day dx code E11.39   gabapentin 600 MG tablet Commonly known as:  NEURONTIN TAKE TWO TABLETS BY MOUTH THREE TIMES DAILY   glucose blood test strip Commonly known as:  FREESTYLE LITE Use as instructed to check blood sugar 3 times a day dx code E11.39   insulin regular 250 units/2.63mL (100 units/mL) injection Commonly known as:  NOVOLIN R RELION Take 6 units with breakfast, 8 units with lunch and 8 units with dinner   INSULIN SYRINGE 1CC/29G 29G X 1/2" 1 ML Misc Use 1 per day.   LEVEMIR 100 UNIT/ML  injection Generic drug:  insulin detemir Inject into the skin at bedtime. 35 mg nightly   metFORMIN 500 MG tablet Commonly known as:  GLUCOPHAGE Take by mouth 2 (two) times daily with a meal.   metFORMIN 500 MG tablet Commonly known as:  GLUCOPHAGE TAKE  TWO TABLETS (1000MG ) BY MOUTH IN THE MORNING AND ONE TABLET (500MG ) IN THE EVENING   OVER THE COUNTER MEDICATION Take 2 each by mouth daily. Centrum-Muli. Vitamin Gummy       Allergies:  Allergies  Allergen Reactions  . Lithium Nausea And Vomiting and Other (See Comments)    Other reaction(s): Other (See Comments) Can not keep this medication down. It makes her terribly ill. Can not keep this medication down. It makes her terribly ill.  . Ativan [Lorazepam] Other (See Comments)    Other reaction(s): ANAPHYLAXIS Other reaction(s): Other (See Comments) Abnormal behavior Abnormal behavior  . Demerol [Meperidine] Other (See Comments)    Other reaction(s): ANAPHYLAXIS Other reaction(s): Other (See Comments) Abnormal behavior Abnormal behavior  . Oxycodone Other (See Comments)    Other reaction(s): OTHER Abnormal behavior  . Darvocet [Propoxyphene N-Acetaminophen] Itching  . Latex Itching    Other reaction(s): OTHER  . Propoxyphene Itching    Past Medical History:  Diagnosis Date  . Bipolar 1 disorder ([place])   . Cellulitis and abscess of foot 03/08/2015  . Diabetes mellitus    INSULIN DEPENDENT  . Diabetic neuropathy ([place])   . Diabetic neuropathy ([place])   . 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 H/O hiatal hernia   . Hyperlipidemia   . Hypertension    past hx of  . Iron malabsorption 09/26/2015  . Numbness and tingling    Hx; of in B/LLE and B/LUE  . Other iron deficiency anemias 09/20/2015    Past Surgical History:  Procedure Laterality Date  . ABDOMINAL HYSTERECTOMY    . ADENOIDECTOMY     Hx: of  . AMPUTATION  10/24/2011   Procedure: AMPUTATION RAY;  Surgeon: Newt Minion, MD;  Location: [facility];   Service: Orthopedics;  Laterality: Right;  Right Foot 3rd Ray Amputation  . AMPUTATION Right 12/16/2012   Procedure: Right Foot Transmetatarsal Amputation;  Surgeon: Newt Minion, MD;  Location: [facility];  Service: Orthopedics;  Laterality: Right;  . AMPUTATION Left 03/09/2015   Procedure: LEFT FOOT 1ST RAY AMPUTATION;  Surgeon: Newt Minion, MD;  Location: [facility];  Service: Orthopedics;  Laterality: Left;  . BLADDER SURGERY     x 2, tacked 1st time; mesh "eroded", had to be removed  . Bladder Tact   2002  . BREAST SURGERY Left    "knot removed"  . CARPAL TUNNEL RELEASE Left   . COLON SURGERY    . NASAL SEPTUM SURGERY  1976  . TONSILLECTOMY     age 46's    Family History  Problem Relation Age of Onset  . Diabetes Mother   . Mental illness Mother   . Bipolar disorder Mother   . Hypertension Father   . Diabetes Maternal Grandmother   . Heart disease Neg Hx     Social History:  reports that she has never smoked. She has never used smokeless tobacco. She reports that she does not drink alcohol or use drugs.    Review of Systems   She is asking about shakiness for the last 2 weeks or so, this is more when she is trying to do something and is not associated with hypoglycemic symptoms or anxiety   DIABETIC foot ulcers with history of amputations: She is [facility] with bilateral ulcers  Orthopedic Surgeon did an amputation of the right mid foot in December  She has had extra sensitivity of her feet when trying to stand or walk.  Has been on gabapentin and analgesics from PCP Could not tolerate Cymbalta She did previously benefit from a trial of capsaicin that was suggested    Lipid history: On treatment with Lipitor and followed by PCP    Lab Results  Component Value Date   CHOL 103 03/09/2015   HDL 27 (L) 03/09/2015   LDLCALC 57 03/09/2015   TRIG 96 03/09/2015   CHOLHDL 3.8 03/09/2015           Has diabetic neuropathy with sensory  loss   Physical Examination:  BP 130/70   Pulse 78   Ht 5\' 1"  (1.549 m)   SpO2 (!) 88%     No tremor present of the outstretched hands    ASSESSMENT:  Diabetes type 2, uncontrolled     See history of present illness for detailed discussion of  current management, blood sugar patterns and problems identified  Her blood sugars are variable and probably a little higher overall compared to when she was on the V-go pump She has variable blood sugar patterns and more recently with her trying to be better on diet her blood sugars are looking somewhat better except on Saturday night  Unable to make any changes in her regimen since she does not have consistent high or low readings, also having some difficulty monitoring because of tremor  Recommendations: She will need to check blood sugars at various times including lunchtime she is not doing much Although she has some good readings at suppertime not clear if her Levemir is consistently lasting 24 hours For more accuracy and convenience she can switch to the FlexPen, discussed how to use this She will continue with the generic Regular Insulin but reminded her to take it 30 minutes before eating each meal If she is eating more carbohydrate or simple sugars will have to increase her regular insulin by 2-4 units She will call if she has any consistently high or low sugar patterns at any time of the day Check A1c on the next visit in 2 months  She will need to discuss her tremor with PCP, currently having difficulty monitoring because of this  There are no Patient Instructions on file for this visit.    Counseling time on subjects discussed above is over 50% of today's 25 minute visit  Amari Zagal 05/19/2016, 3:41 PM   Note: This office note was prepared with Dragon voice recognition system technology. Any transcriptional errors that result from this process are unintentional.

## 2016-05-20 ENCOUNTER — Telehealth: Payer: Self-pay | Admitting: Physician Assistant

## 2016-05-20 DIAGNOSIS — Z8659 Personal history of other mental and behavioral disorders: Secondary | ICD-10-CM | POA: Diagnosis not present

## 2016-05-20 DIAGNOSIS — R251 Tremor, unspecified: Secondary | ICD-10-CM | POA: Diagnosis not present

## 2016-05-20 DIAGNOSIS — L97421 Non-pressure chronic ulcer of left heel and midfoot limited to breakdown of skin: Secondary | ICD-10-CM | POA: Diagnosis not present

## 2016-05-20 DIAGNOSIS — E1122 Type 2 diabetes mellitus with diabetic chronic kidney disease: Secondary | ICD-10-CM | POA: Diagnosis not present

## 2016-05-20 DIAGNOSIS — F319 Bipolar disorder, unspecified: Secondary | ICD-10-CM | POA: Diagnosis not present

## 2016-05-20 DIAGNOSIS — Z8739 Personal history of other diseases of the musculoskeletal system and connective tissue: Secondary | ICD-10-CM | POA: Diagnosis not present

## 2016-05-20 DIAGNOSIS — G629 Polyneuropathy, unspecified: Secondary | ICD-10-CM | POA: Diagnosis not present

## 2016-05-20 DIAGNOSIS — E860 Dehydration: Secondary | ICD-10-CM | POA: Diagnosis not present

## 2016-05-20 DIAGNOSIS — R4182 Altered mental status, unspecified: Secondary | ICD-10-CM | POA: Diagnosis not present

## 2016-05-20 DIAGNOSIS — N289 Disorder of kidney and ureter, unspecified: Secondary | ICD-10-CM | POA: Diagnosis not present

## 2016-05-20 DIAGNOSIS — R269 Unspecified abnormalities of gait and mobility: Secondary | ICD-10-CM | POA: Diagnosis not present

## 2016-05-20 DIAGNOSIS — D473 Essential (hemorrhagic) thrombocythemia: Secondary | ICD-10-CM | POA: Diagnosis not present

## 2016-05-20 DIAGNOSIS — E1142 Type 2 diabetes mellitus with diabetic polyneuropathy: Secondary | ICD-10-CM | POA: Diagnosis not present

## 2016-05-20 DIAGNOSIS — T3695XA Adverse effect of unspecified systemic antibiotic, initial encounter: Secondary | ICD-10-CM | POA: Diagnosis not present

## 2016-05-20 DIAGNOSIS — I4510 Unspecified right bundle-branch block: Secondary | ICD-10-CM | POA: Diagnosis not present

## 2016-05-20 DIAGNOSIS — E11649 Type 2 diabetes mellitus with hypoglycemia without coma: Secondary | ICD-10-CM | POA: Diagnosis not present

## 2016-05-20 DIAGNOSIS — F3132 Bipolar disorder, current episode depressed, moderate: Secondary | ICD-10-CM | POA: Diagnosis not present

## 2016-05-20 DIAGNOSIS — E118 Type 2 diabetes mellitus with unspecified complications: Secondary | ICD-10-CM | POA: Diagnosis not present

## 2016-05-20 DIAGNOSIS — Z89431 Acquired absence of right foot: Secondary | ICD-10-CM | POA: Diagnosis not present

## 2016-05-20 DIAGNOSIS — Z794 Long term (current) use of insulin: Secondary | ICD-10-CM | POA: Diagnosis not present

## 2016-05-20 DIAGNOSIS — R262 Difficulty in walking, not elsewhere classified: Secondary | ICD-10-CM | POA: Diagnosis not present

## 2016-05-20 DIAGNOSIS — Z90710 Acquired absence of both cervix and uterus: Secondary | ICD-10-CM | POA: Diagnosis not present

## 2016-05-20 DIAGNOSIS — R2681 Unsteadiness on feet: Secondary | ICD-10-CM | POA: Diagnosis not present

## 2016-05-20 DIAGNOSIS — E11621 Type 2 diabetes mellitus with foot ulcer: Secondary | ICD-10-CM | POA: Diagnosis not present

## 2016-05-20 DIAGNOSIS — G9009 Other idiopathic peripheral autonomic neuropathy: Secondary | ICD-10-CM | POA: Diagnosis not present

## 2016-05-20 DIAGNOSIS — M6281 Muscle weakness (generalized): Secondary | ICD-10-CM | POA: Diagnosis not present

## 2016-05-20 DIAGNOSIS — Z89412 Acquired absence of left great toe: Secondary | ICD-10-CM | POA: Diagnosis not present

## 2016-05-20 DIAGNOSIS — Z944 Liver transplant status: Secondary | ICD-10-CM | POA: Diagnosis not present

## 2016-05-20 DIAGNOSIS — E119 Type 2 diabetes mellitus without complications: Secondary | ICD-10-CM | POA: Diagnosis not present

## 2016-05-20 DIAGNOSIS — G9340 Encephalopathy, unspecified: Secondary | ICD-10-CM | POA: Diagnosis not present

## 2016-05-20 DIAGNOSIS — G92 Toxic encephalopathy: Secondary | ICD-10-CM | POA: Diagnosis not present

## 2016-05-20 DIAGNOSIS — Z7982 Long term (current) use of aspirin: Secondary | ICD-10-CM | POA: Diagnosis not present

## 2016-05-20 DIAGNOSIS — I10 Essential (primary) hypertension: Secondary | ICD-10-CM | POA: Diagnosis not present

## 2016-05-20 DIAGNOSIS — R410 Disorientation, unspecified: Secondary | ICD-10-CM | POA: Diagnosis not present

## 2016-05-20 DIAGNOSIS — F419 Anxiety disorder, unspecified: Secondary | ICD-10-CM | POA: Diagnosis not present

## 2016-05-20 DIAGNOSIS — R531 Weakness: Secondary | ICD-10-CM | POA: Diagnosis not present

## 2016-05-20 NOTE — Telephone Encounter (Signed)
Can you call pt and see if she needs an appt.

## 2016-05-20 NOTE — Telephone Encounter (Signed)
Patient Name: Dana [name] DOB: 12-Jan-1956 Initial Comment caller states wife has flu sx Nurse Assessment Guidelines Guideline Title Affirmed Question Affirmed Notes Final Disposition User FINAL ATTEMPT MADE - no message left Ronnald Ramp, Therapist, sports, [employer]

## 2016-05-20 NOTE — Telephone Encounter (Signed)
Pt husband called me for an appt. Husband states that pt was feeling very weak, shaking, and had just had part of her foot amputated and though that she may need blood. I sent call to [facility] and they replied back stating she had flu sx, would you mind reaching out to him and seeing what needs to be done please?

## 2016-05-20 NOTE — Telephone Encounter (Signed)
LM requesting call back to discuss symptoms and determine need for evaluation.

## 2016-05-21 DIAGNOSIS — G9340 Encephalopathy, unspecified: Secondary | ICD-10-CM | POA: Diagnosis not present

## 2016-05-21 DIAGNOSIS — R410 Disorientation, unspecified: Secondary | ICD-10-CM | POA: Diagnosis not present

## 2016-05-21 DIAGNOSIS — L97421 Non-pressure chronic ulcer of left heel and midfoot limited to breakdown of skin: Secondary | ICD-10-CM | POA: Diagnosis not present

## 2016-05-21 DIAGNOSIS — E119 Type 2 diabetes mellitus without complications: Secondary | ICD-10-CM | POA: Diagnosis not present

## 2016-05-21 DIAGNOSIS — R4182 Altered mental status, unspecified: Secondary | ICD-10-CM | POA: Diagnosis not present

## 2016-05-21 DIAGNOSIS — E860 Dehydration: Secondary | ICD-10-CM | POA: Diagnosis not present

## 2016-05-21 DIAGNOSIS — R2681 Unsteadiness on feet: Secondary | ICD-10-CM | POA: Diagnosis not present

## 2016-05-21 DIAGNOSIS — Z794 Long term (current) use of insulin: Secondary | ICD-10-CM | POA: Diagnosis not present

## 2016-05-21 DIAGNOSIS — E11621 Type 2 diabetes mellitus with foot ulcer: Secondary | ICD-10-CM | POA: Diagnosis not present

## 2016-05-27 DIAGNOSIS — F3132 Bipolar disorder, current episode depressed, moderate: Secondary | ICD-10-CM | POA: Diagnosis not present

## 2016-05-27 DIAGNOSIS — L97421 Non-pressure chronic ulcer of left heel and midfoot limited to breakdown of skin: Secondary | ICD-10-CM | POA: Diagnosis not present

## 2016-05-27 DIAGNOSIS — R531 Weakness: Secondary | ICD-10-CM | POA: Diagnosis not present

## 2016-05-27 DIAGNOSIS — N289 Disorder of kidney and ureter, unspecified: Secondary | ICD-10-CM | POA: Diagnosis not present

## 2016-05-27 DIAGNOSIS — R262 Difficulty in walking, not elsewhere classified: Secondary | ICD-10-CM | POA: Diagnosis not present

## 2016-05-27 DIAGNOSIS — R2681 Unsteadiness on feet: Secondary | ICD-10-CM | POA: Diagnosis not present

## 2016-05-27 DIAGNOSIS — I10 Essential (primary) hypertension: Secondary | ICD-10-CM | POA: Diagnosis not present

## 2016-05-27 DIAGNOSIS — E860 Dehydration: Secondary | ICD-10-CM | POA: Diagnosis not present

## 2016-05-27 DIAGNOSIS — Z944 Liver transplant status: Secondary | ICD-10-CM | POA: Diagnosis not present

## 2016-05-27 DIAGNOSIS — G9340 Encephalopathy, unspecified: Secondary | ICD-10-CM | POA: Diagnosis not present

## 2016-05-27 DIAGNOSIS — E119 Type 2 diabetes mellitus without complications: Secondary | ICD-10-CM | POA: Diagnosis not present

## 2016-05-27 DIAGNOSIS — M6281 Muscle weakness (generalized): Secondary | ICD-10-CM | POA: Diagnosis not present

## 2016-05-27 DIAGNOSIS — E1122 Type 2 diabetes mellitus with diabetic chronic kidney disease: Secondary | ICD-10-CM | POA: Diagnosis not present

## 2016-05-27 DIAGNOSIS — G9009 Other idiopathic peripheral autonomic neuropathy: Secondary | ICD-10-CM | POA: Diagnosis not present

## 2016-05-27 DIAGNOSIS — E11621 Type 2 diabetes mellitus with foot ulcer: Secondary | ICD-10-CM | POA: Diagnosis not present

## 2016-05-28 ENCOUNTER — Ambulatory Visit: Payer: PPO | Admitting: Physician Assistant

## 2016-05-28 DIAGNOSIS — G9340 Encephalopathy, unspecified: Secondary | ICD-10-CM | POA: Diagnosis not present

## 2016-05-28 DIAGNOSIS — E11621 Type 2 diabetes mellitus with foot ulcer: Secondary | ICD-10-CM | POA: Diagnosis not present

## 2016-05-28 DIAGNOSIS — G9009 Other idiopathic peripheral autonomic neuropathy: Secondary | ICD-10-CM | POA: Diagnosis not present

## 2016-05-28 DIAGNOSIS — L97509 Non-pressure chronic ulcer of other part of unspecified foot with unspecified severity: Secondary | ICD-10-CM | POA: Diagnosis not present

## 2016-05-29 DIAGNOSIS — M86271 Subacute osteomyelitis, right ankle and foot: Secondary | ICD-10-CM | POA: Diagnosis not present

## 2016-06-04 ENCOUNTER — Other Ambulatory Visit: Payer: Self-pay | Admitting: *Deleted

## 2016-06-04 DIAGNOSIS — L97509 Non-pressure chronic ulcer of other part of unspecified foot with unspecified severity: Secondary | ICD-10-CM | POA: Diagnosis not present

## 2016-06-04 DIAGNOSIS — G9340 Encephalopathy, unspecified: Secondary | ICD-10-CM | POA: Diagnosis not present

## 2016-06-04 DIAGNOSIS — G9009 Other idiopathic peripheral autonomic neuropathy: Secondary | ICD-10-CM | POA: Diagnosis not present

## 2016-06-04 DIAGNOSIS — E11621 Type 2 diabetes mellitus with foot ulcer: Secondary | ICD-10-CM | POA: Diagnosis not present

## 2016-06-04 NOTE — Patient Outreach (Signed)
[facility] ([facility]) Care Management  06/04/2016  Brooklynn B Febres 03/30/1956 [id number]  Met with Gwen Hairston, RN, discharge planner at facility. She states that patient will be discharging today. She has set up patient to go to outpatient rehab and referral to [facility] for prosthesis management. Patient spouse is supportive and has gotten ramp built for home and he is managing patient medications.   No [facility] Care management needs identified at this time.  Will sign off case.   E. , RN, BSN, CCM  Post Acute Care Coordinator [facility] ([phone]) Business Cell  ([phone]) Toll Free Office  

## 2016-06-09 DIAGNOSIS — Z79899 Other long term (current) drug therapy: Secondary | ICD-10-CM | POA: Diagnosis not present

## 2016-06-10 ENCOUNTER — Ambulatory Visit (INDEPENDENT_AMBULATORY_CARE_PROVIDER_SITE_OTHER): Payer: PPO | Admitting: Physician Assistant

## 2016-06-10 ENCOUNTER — Encounter: Payer: Self-pay | Admitting: Physician Assistant

## 2016-06-10 VITALS — BP 110/64 | HR 64 | Temp 97.6°F | Resp 14 | Ht 61.0 in | Wt 161.0 lb

## 2016-06-10 DIAGNOSIS — E785 Hyperlipidemia, unspecified: Secondary | ICD-10-CM | POA: Diagnosis not present

## 2016-06-10 DIAGNOSIS — F319 Bipolar disorder, unspecified: Secondary | ICD-10-CM | POA: Diagnosis not present

## 2016-06-10 DIAGNOSIS — D509 Iron deficiency anemia, unspecified: Secondary | ICD-10-CM

## 2016-06-10 DIAGNOSIS — D631 Anemia in chronic kidney disease: Secondary | ICD-10-CM

## 2016-06-10 DIAGNOSIS — E1165 Type 2 diabetes mellitus with hyperglycemia: Secondary | ICD-10-CM | POA: Diagnosis not present

## 2016-06-10 DIAGNOSIS — E11311 Type 2 diabetes mellitus with unspecified diabetic retinopathy with macular edema: Secondary | ICD-10-CM

## 2016-06-10 LAB — BASIC METABOLIC PANEL
BUN: 17 mg/dL (ref 6–23)
CALCIUM: 9.2 mg/dL (ref 8.4–10.5)
CO2: 33 mEq/L — ABNORMAL HIGH (ref 19–32)
Chloride: 103 mEq/L (ref 96–112)
Creatinine, Ser: 0.68 mg/dL (ref 0.40–1.20)
GFR: 94 mL/min (ref >60.00)
GLUCOSE: 119 mg/dL — AB (ref 70–99)
POTASSIUM: 4.7 meq/L (ref 3.5–5.1)
SODIUM: 139 meq/L (ref 135–145)

## 2016-06-10 LAB — CBC
HCT: 34.4 % — ABNORMAL LOW (ref 36.0–46.0)
Hemoglobin: 11.6 g/dL — ABNORMAL LOW (ref 12.0–15.0)
MCHC: 33.9 g/dL (ref 30.0–36.0)
MCV: 91.5 fl (ref 78.0–100.0)
Platelets: 349 10*3/uL (ref 150.0–400.0)
RBC: 3.75 Mil/uL — ABNORMAL LOW (ref 3.87–5.11)
RDW: 15.5 % (ref 11.5–15.5)
WBC: 8.1 10*3/uL (ref 4.0–10.5)

## 2016-06-10 LAB — LIPID PANEL
CHOLESTEROL: 218 mg/dL — AB (ref 0–200)
HDL: 56.7 mg/dL (ref >39.00)
LDL Cholesterol: 123 mg/dL — ABNORMAL HIGH (ref 0–99)
NONHDL: 161.3
TRIGLYCERIDES: 190 mg/dL — AB (ref 0.0–149.0)
Total CHOL/HDL Ratio: 4
VLDL: 38 mg/dL (ref 0.0–40.0)

## 2016-06-10 NOTE — Progress Notes (Signed)
Pre visit review using our clinic review tool, if applicable. No additional management support is needed unless otherwise documented below in the visit note. 

## 2016-06-10 NOTE — Patient Instructions (Signed)
Please go to the lab for bloodwork. I will call you with your results.  Please continue chronic mediations as directed by Endocrinology and Psychiatry. Your BP looks good today. Please remember to log your fasting sugars. They seem much improved on current regimen.  Follow-up with me will be based on your lab results.

## 2016-06-10 NOTE — Progress Notes (Signed)
Patient presents to clinic today for hospital follow-up. Patient presented to [facility] on 05/20/2016 with c/o confusion and gait instability. ER workup included negative CT and unremarkable labs. Patient subsequently admitted to the hospital for further assessment. Neurology was consulted and Lumbar Puncture performed revealing increased protein, normal cell count and glucose. Negative culture and gram stain.  Also negative autoimmune encephalitis panel. Had negative EEG and MRI. Encephalopathy resolved on its own. Psychiatry consulted due to patient's history of Bipolar Disorder and medications were changed. Patient discharged to follow-up with Neurology, Endocrinology, PCP and Psychiatry outpatient.    Since discharge, patient endorses doing very well. Is still following with the wound clinic weekly. Denies any recurrence of confusion. Patient denies chest pain, palpitations, lightheadedness, dizziness, vision changes or frequent headaches. Endorses mood is doing very well overall. Has scheduled appointment with Endocrinology and Psychiatry. Has to call to schedule follow-up with Neurology. Endorses fasting sugars are averaging in the 150s at home. Denies new concerns today.    Past Medical History:  Diagnosis Date  . Bipolar 1 disorder ([place])   . Cellulitis and abscess of foot 03/08/2015  . Diabetes mellitus    INSULIN DEPENDENT  . Diabetic neuropathy ([place])   . Diabetic neuropathy ([place])   . 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 H/O hiatal hernia   . Hyperlipidemia   . Hypertension    past hx of  . Iron malabsorption 09/26/2015  . Numbness and tingling    Hx; of in B/LLE and B/LUE  . Other iron deficiency anemias 09/20/2015    Current Outpatient Prescriptions on File Prior to Visit  Medication Sig Dispense Refill  . atorvastatin (LIPITOR) 20 MG tablet Take 1 tablet (20 mg total) by mouth at bedtime. 30 tablet 5  . Blood Glucose Monitoring Suppl (FREESTYLE LITE) DEVI Use to  check blood sugar 3 times a day dx code E11.39 1 each 0  . escitalopram (LEXAPRO) 10 MG tablet Take 30 mg by mouth daily.     Marland Kitchen gabapentin (NEURONTIN) 600 MG tablet TAKE TWO TABLETS BY MOUTH THREE TIMES DAILY 180 tablet 3  . glucose blood (FREESTYLE LITE) test strip Use as instructed to check blood sugar 3 times a day dx code E11.39 100 each 3  . insulin regular (NOVOLIN R RELION) 100 units/mL injection Take 6 units with breakfast, 8 units with lunch and 8 units with dinner (Patient taking differently: Inject 4-10 Units into the skin 3 (three) times daily before meals. Per meal size, small meals = less pumps to Vgo and large meal = more pumps to Vgo. 1 pump = 2 units.) 10 mL 3  . INSULIN SYRINGE 1CC/29G 29G X 1/2" 1 ML MISC Use 1 per day. 100 each 2  . Lancets (FREESTYLE) lancets Use as instructed to check blood sugar 3 times per day dx code E11.39 100 each 3  . metFORMIN (GLUCOPHAGE) 500 MG tablet TAKE TWO TABLETS (1000MG) BY MOUTH IN THE MORNING AND ONE TABLET (500MG) IN THE EVENING 90 tablet 5  . OVER THE COUNTER MEDICATION Take 2 each by mouth daily. Centrum-Muli. Vitamin Gummy     No current facility-administered medications on file prior to visit.     Allergies  Allergen Reactions  . Lithium Nausea And Vomiting and Other (See Comments)    Other reaction(s): Other (See Comments) Can not keep this medication down. It makes her terribly ill. Can not keep this medication down. It makes her terribly ill.  Francee Gentile [Lorazepam] Other (See  Comments)    Other reaction(s): ANAPHYLAXIS Other reaction(s): Other (See Comments) Abnormal behavior Abnormal behavior  . Demerol [Meperidine] Other (See Comments)    Other reaction(s): ANAPHYLAXIS Other reaction(s): Other (See Comments) Abnormal behavior Abnormal behavior  . Oxycodone Other (See Comments)    Other reaction(s): OTHER Abnormal behavior  . Darvocet [Propoxyphene N-Acetaminophen] Itching  . Latex Itching    Other reaction(s): OTHER    . Propoxyphene Itching    Family History  Problem Relation Age of Onset  . Diabetes Mother   . Mental illness Mother   . Bipolar disorder Mother   . Hypertension Father   . Diabetes Maternal Grandmother   . Heart disease Neg Hx     Social History   Social History  . Marital status: Married    Spouse name: N/A  . Number of children: 2  . Years of education: 14   Occupational History  .      disabled   Social History Main Topics  . Smoking status: Never Smoker  . Smokeless tobacco: Never Used  . Alcohol use No  . Drug use: No  . Sexual activity: No   Other Topics Concern  . None   Social History Narrative   Lives with husband in home   Caffeine use - Coke 2 or 3 16 oz daily   Review of Systems - See HPI.  All other ROS are negative.  BP 110/64   Pulse 64   Temp 97.6 F (36.4 C) (Oral)   Resp 14   Ht '5\' 1"'  (1.549 m)   Wt 161 lb (73 kg)   SpO2 98%   BMI 30.42 kg/m   Physical Exam  Constitutional: She is oriented to person, place, and time and well-developed, well-nourished, and in no distress.  HENT:  Head: Normocephalic and atraumatic.  Eyes: Conjunctivae are normal.  Neck: Neck supple.  Cardiovascular: Normal rate, regular rhythm, normal heart sounds and intact distal pulses.   Pulmonary/Chest: Effort normal and breath sounds normal. No respiratory distress. She has no wheezes. She has no rales. She exhibits no tenderness.  Neurological: She is alert and oriented to person, place, and time.  Skin: Skin is warm and dry. No rash noted.  Psychiatric: Affect normal.  Vitals reviewed.  Recent Results (from the past 2160 hour(s))  CBC     Status: Abnormal   Collection Time: 04/11/16  2:58 PM  Result Value Ref Range   WBC 11.0 (H) 4.0 - 10.5 K/uL   RBC 2.64 (L) 3.87 - 5.11 Mil/uL   Platelets 820.0 (H) 150.0 - 400.0 K/uL   Hemoglobin 7.5 Repeated and verified X2. (LL) 12.0 - 15.0 g/dL   HCT 22.5 Repeated and verified X2. (LL) 36.0 - 46.0 %   MCV 85.3  78.0 - 100.0 fl   MCHC 33.2 30.0 - 36.0 g/dL   RDW 15.3 11.5 - 53.6 %  Basic metabolic panel     Status: Abnormal   Collection Time: 04/11/16  2:58 PM  Result Value Ref Range   Sodium 139 135 - 145 mEq/L   Potassium 5.5 (H) 3.5 - 5.1 mEq/L   Chloride 103 96 - 112 mEq/L   CO2 27 19 - 32 mEq/L   Glucose, Bld 149 (H) 70 - 99 mg/dL   BUN 42 (H) 6 - 23 mg/dL   Creatinine, Ser 1.01 0.40 - 1.20 mg/dL   Calcium 9.1 8.4 - 10.5 mg/dL   GFR 59.58 (L) >60.00 mL/min  INR/PT  Status: Abnormal   Collection Time: 04/11/16  2:58 PM  Result Value Ref Range   INR 1.2 (H) 0.8 - 1.0 ratio   Prothrombin Time 12.9 9.6 - 13.1 sec  CBC w/Diff     Status: Abnormal   Collection Time: 04/16/16  1:28 PM  Result Value Ref Range   WBC 8.2 3.9 - 10.0 10e3/uL   RBC 3.21 (L) 3.70 - 5.32 10e6/uL   HGB 9.3 (L) 11.6 - 15.9 g/dL   HCT 29.3 (L) 34.8 - 46.6 %   MCV 91 81 - 101 fL   MCH 29.0 26.0 - 34.0 pg   MCHC 31.7 (L) 32.0 - 36.0 g/dL   RDW 13.9 11.1 - 15.7 %   Platelets 664 (H) 145 - 400 10e3/uL   NEUT# 5.5 1.5 - 6.5 10e3/uL   LYMPH# 1.8 0.9 - 3.3 10e3/uL   MONO# 0.5 0.1 - 0.9 10e3/uL   Eosinophils Absolute 0.4 0.0 - 0.5 10e3/uL   BASO# 0.1 0.0 - 0.2 10e3/uL   NEUT% 66.4 39.6 - 80.0 %   LYMPH% 22.3 14.0 - 48.0 %   MONO% 5.8 0.0 - 13.0 %   EOS% 4.9 0.0 - 7.0 %   BASO% 0.6 0.0 - 2.0 %  Smear     Status: None   Collection Time: 04/16/16  1:28 PM  Result Value Ref Range   Smear Result Smear Available   CMP     Status: Abnormal   Collection Time: 04/16/16  1:28 PM  Result Value Ref Range   Sodium 139 136 - 145 mEq/L   Potassium 5.0 3.5 - 5.1 mEq/L   Chloride 103 98 - 109 mEq/L   CO2 26 22 - 29 mEq/L   Glucose 125 70 - 140 mg/dl    Comment: Glucose reference range is for nonfasting patients. Fasting glucose reference range is 70- 100.   BUN 37.9 (H) 7.0 - 26.0 mg/dL   Creatinine 1.0 0.6 - 1.1 mg/dL   Total Bilirubin 0.29 0.20 - 1.20 mg/dL   Alkaline Phosphatase 197 (H) 40 - 150 U/L   AST 10 5 -  34 U/L   ALT 8 0 - 55 U/L   Total Protein 8.6 (H) 6.4 - 8.3 g/dL   Albumin 2.4 (L) 3.5 - 5.0 g/dL   Calcium 9.9 8.4 - 10.4 mg/dL   Anion Gap 11 3 - 11 mEq/L   EGFR 62 (L) >90 ml/min/1.73 m2    Comment: eGFR is calculated using the CKD-EPI Creatinine Equation (2009)  Ferritin     Status: Abnormal   Collection Time: 04/16/16  1:28 PM  Result Value Ref Range   Ferritin 2,650 Result Confirmed by Automated Dilution. (H) 9 - 269 ng/ml  Erythropoietin     Status: None   Collection Time: 04/16/16  1:28 PM  Result Value Ref Range   Erythropoietin 18.1 2.6 - 18.5 mIU/mL    Comment: Siemens Immulite 2000 Immunochemiluminometric assay [facility])  Iron and TIBC     Status: Abnormal   Collection Time: 04/16/16  1:28 PM  Result Value Ref Range   Iron 26 (L) 41 - 142 ug/dL   TIBC 191 (L) 236 - 444 ug/dL   UIBC 165 120 - 384 ug/dL   %SAT 13 (L) 21 - 57 %  Reticulocytes     Status: None   Collection Time: 04/16/16  1:28 PM  Result Value Ref Range   Reticulocyte Count 1.0 0.6 - 2.6 %  CBC     Status: Abnormal  Collection Time: 06/10/16  3:03 PM  Result Value Ref Range   WBC 8.1 4.0 - 10.5 K/uL   RBC 3.75 (L) 3.87 - 5.11 Mil/uL   Platelets 349.0 150.0 - 400.0 K/uL   Hemoglobin 11.6 (L) 12.0 - 15.0 g/dL   HCT 34.4 (L) 36.0 - 46.0 %   MCV 91.5 78.0 - 100.0 fl   MCHC 33.9 30.0 - 36.0 g/dL   RDW 15.5 11.5 - 16.1 %  Basic metabolic panel     Status: Abnormal   Collection Time: 06/10/16  3:03 PM  Result Value Ref Range   Sodium 139 135 - 145 mEq/L   Potassium 4.7 3.5 - 5.1 mEq/L   Chloride 103 96 - 112 mEq/L   CO2 33 (H) 19 - 32 mEq/L   Glucose, Bld 119 (H) 70 - 99 mg/dL   BUN 17 6 - 23 mg/dL   Creatinine, Ser 0.68 0.40 - 1.20 mg/dL   Calcium 9.2 8.4 - 10.5 mg/dL   GFR 94.00 >60.00 mL/min  Lipid panel     Status: Abnormal   Collection Time: 06/10/16  3:03 PM  Result Value Ref Range   Cholesterol 218 (H) 0 - 200 mg/dL    Comment: ATP III Classification       Desirable:  < 200 mg/dL                Borderline High:  200 - 239 mg/dL          High:  > = 240 mg/dL   Triglycerides 190.0 (H) 0.0 - 149.0 mg/dL    Comment: Normal:  <150 mg/dLBorderline High:  150 - 199 mg/dL   HDL 56.70 >39.00 mg/dL   VLDL 38.0 0.0 - 40.0 mg/dL   LDL Cholesterol 123 (H) 0 - 99 mg/dL   Total CHOL/HDL Ratio 4     Comment:                Men          Women1/2 Average Risk     3.4          3.3Average Risk          5.0          4.42X Average Risk          9.6          7.13X Average Risk          15.0          11.0                       NonHDL 161.30     Comment: NOTE:  Non-HDL goal should be 30 mg/dL higher than patient's LDL goal (i.e. LDL goal of < 70 mg/dL, would have non-HDL goal of < 100 mg/dL)  CBC w/Diff     Status: None   Collection Time: 06/19/16  1:15 PM  Result Value Ref Range   WBC 6.2 3.9 - 10.0 10e3/uL   RBC 3.77 3.70 - 5.32 10e6/uL   HGB 11.9 11.6 - 15.9 g/dL   HCT 35.7 34.8 - 46.6 %   MCV 95 81 - 101 fL   MCH 31.6 26.0 - 34.0 pg   MCHC 33.3 32.0 - 36.0 g/dL   RDW 14.0 11.1 - 15.7 %   Platelets 275 145 - 400 10e3/uL   NEUT# 3.6 1.5 - 6.5 10e3/uL   LYMPH# 1.9 0.9 - 3.3 10e3/uL   MONO# 0.4 0.1 -  0.9 10e3/uL   Eosinophils Absolute 0.3 0.0 - 0.5 10e3/uL   BASO# 0.0 0.0 - 0.2 10e3/uL   NEUT% 57.6 39.6 - 80.0 %   LYMPH% 30.3 14.0 - 48.0 %   MONO% 7.1 0.0 - 13.0 %   EOS% 4.5 0.0 - 7.0 %   BASO% 0.5 0.0 - 2.0 %  Ferritin     Status: Abnormal   Collection Time: 06/19/16  1:15 PM  Result Value Ref Range   Ferritin 981 (H) 9 - 269 ng/ml  Iron and TIBC     Status: None   Collection Time: 06/19/16  1:15 PM  Result Value Ref Range   Iron 72 41 - 142 ug/dL   TIBC 254 236 - 444 ug/dL   UIBC 182 120 - 384 ug/dL   %SAT 28 21 - 57 %  Erythropoietin     Status: None   Collection Time: 06/19/16  1:15 PM  Result Value Ref Range   Erythropoietin 13.8 2.6 - 18.5 mIU/mL    Comment: Siemens Immulite 2000 Immunochemiluminometric assay ([facility])  Reticulocytes     Status: None   Collection Time:  06/19/16  1:15 PM  Result Value Ref Range   Reticulocyte Count 1.2 0.6 - 2.6 %  Comprehensive metabolic panel     Status: Abnormal   Collection Time: 06/19/16  1:15 PM  Result Value Ref Range   Glucose 122 (H) 65 - 99 mg/dL   BUN 16 6 - 24 mg/dL   Creatinine, Ser 0.71 0.57 - 1.00 mg/dL   GFR calc non Af Amer 94 >59 mL/min/1.73   GFR calc Af Amer 108 >59 mL/min/1.73   BUN/Creatinine Ratio 23 9 - 23   Sodium 136 134 - 144 mmol/L   Potassium, Ser 5.1 3.5 - 5.2 mmol/L   Chloride, Ser 100 96 - 106 mmol/L   Carbon Dioxide, Total 30 (H) 18 - 29 mmol/L   Calcium, Ser 9.6 8.7 - 10.2 mg/dL   Total Protein 7.3 6.0 - 8.5 g/dL   Albumin, Serum 3.9 3.5 - 5.5 g/dL   Globulin, Total 3.4 1.5 - 4.5 g/dL   Albumin/Globulin Ratio 1.1 (L) 1.2 - 2.2   Bilirubin Total 0.2 0.0 - 1.2 mg/dL   Alkaline Phosphatase, S 179 (H) 39 - 117 IU/L   AST (SGOT) 78 (H) 0 - 40 IU/L   ALT 134 (H) 0 - 32 IU/L   Assessment/Plan: 1. Iron deficiency anemia, unspecified iron deficiency anemia type Repeat labs today to assess stability and improving hemoglobin. - CBC - Basic metabolic panel  2. Hyperlipidemia LDL goal <100 Repeat lipids today.  - Lipid panel  3. 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 Continue care as directed by Endocrinology. Has FU scheduled.  4. Bipolar 1 disorder ([place]) Doing well. FU with outpatient psychiatry scheduled.  5. Acute Encephalopathy Unclear etiology. Negative workup. Patient with OP follow-up. No recurrence of symptoms. Alert and oriented x 3. Sugars stable. Repeat labs today. Fu with specialists as scheduled.   [name], PA-C

## 2016-06-12 ENCOUNTER — Other Ambulatory Visit: Payer: Self-pay

## 2016-06-12 ENCOUNTER — Telehealth: Payer: Self-pay | Admitting: Endocrinology

## 2016-06-12 DIAGNOSIS — S91329A Laceration with foreign body, unspecified foot, initial encounter: Secondary | ICD-10-CM | POA: Diagnosis not present

## 2016-06-12 DIAGNOSIS — E1140 Type 2 diabetes mellitus with diabetic neuropathy, unspecified: Secondary | ICD-10-CM | POA: Diagnosis not present

## 2016-06-12 DIAGNOSIS — E11621 Type 2 diabetes mellitus with foot ulcer: Secondary | ICD-10-CM | POA: Diagnosis not present

## 2016-06-12 DIAGNOSIS — L97421 Non-pressure chronic ulcer of left heel and midfoot limited to breakdown of skin: Secondary | ICD-10-CM | POA: Diagnosis not present

## 2016-06-12 DIAGNOSIS — M868X7 Other osteomyelitis, ankle and foot: Secondary | ICD-10-CM | POA: Diagnosis not present

## 2016-06-12 DIAGNOSIS — L97522 Non-pressure chronic ulcer of other part of left foot with fat layer exposed: Secondary | ICD-10-CM | POA: Diagnosis not present

## 2016-06-12 MED ORDER — INSULIN DETEMIR 100 UNIT/ML ~~LOC~~ SOLN: SUBCUTANEOUS | 2 refills | Status: DC

## 2016-06-12 NOTE — Telephone Encounter (Signed)
Ordered 06/12/16 

## 2016-06-12 NOTE — Telephone Encounter (Signed)
Pt is requesting the levemir be called in to [place]

## 2016-06-13 ENCOUNTER — Other Ambulatory Visit: Payer: Self-pay

## 2016-06-13 ENCOUNTER — Other Ambulatory Visit: Payer: Self-pay | Admitting: Physician Assistant

## 2016-06-13 MED ORDER — INSULIN DETEMIR 100 UNIT/ML ~~LOC~~ SOLN: SUBCUTANEOUS | 2 refills | Status: DC

## 2016-06-17 ENCOUNTER — Other Ambulatory Visit: Payer: Self-pay | Admitting: Physician Assistant

## 2016-06-17 DIAGNOSIS — E119 Type 2 diabetes mellitus without complications: Secondary | ICD-10-CM | POA: Diagnosis not present

## 2016-06-17 DIAGNOSIS — H25811 Combined forms of age-related cataract, right eye: Secondary | ICD-10-CM | POA: Diagnosis not present

## 2016-06-17 NOTE — Telephone Encounter (Signed)
Please call patient below to let her know imaging will be setting her up for her CT. Remind her of the instructions below regarding her Metformin.

## 2016-06-18 NOTE — Telephone Encounter (Signed)
Left message for patient to call for instructions on medications before CT.

## 2016-06-19 ENCOUNTER — Ambulatory Visit (HOSPITAL_BASED_OUTPATIENT_CLINIC_OR_DEPARTMENT_OTHER): Payer: PPO | Admitting: Hematology & Oncology

## 2016-06-19 ENCOUNTER — Other Ambulatory Visit (HOSPITAL_BASED_OUTPATIENT_CLINIC_OR_DEPARTMENT_OTHER): Payer: PPO

## 2016-06-19 VITALS — BP 123/77 | HR 67 | Temp 97.6°F | Resp 18 | Wt 166.0 lb

## 2016-06-19 DIAGNOSIS — K909 Intestinal malabsorption, unspecified: Secondary | ICD-10-CM

## 2016-06-19 DIAGNOSIS — E1165 Type 2 diabetes mellitus with hyperglycemia: Secondary | ICD-10-CM | POA: Diagnosis not present

## 2016-06-19 DIAGNOSIS — D638 Anemia in other chronic diseases classified elsewhere: Secondary | ICD-10-CM | POA: Diagnosis not present

## 2016-06-19 DIAGNOSIS — D631 Anemia in chronic kidney disease: Secondary | ICD-10-CM

## 2016-06-19 DIAGNOSIS — D509 Iron deficiency anemia, unspecified: Secondary | ICD-10-CM | POA: Diagnosis not present

## 2016-06-19 DIAGNOSIS — D508 Other iron deficiency anemias: Secondary | ICD-10-CM

## 2016-06-19 DIAGNOSIS — E1169 Type 2 diabetes mellitus with other specified complication: Secondary | ICD-10-CM | POA: Diagnosis not present

## 2016-06-19 DIAGNOSIS — E11311 Type 2 diabetes mellitus with unspecified diabetic retinopathy with macular edema: Secondary | ICD-10-CM

## 2016-06-19 DIAGNOSIS — N189 Chronic kidney disease, unspecified: Secondary | ICD-10-CM

## 2016-06-19 LAB — CBC WITH DIFFERENTIAL (CANCER CENTER ONLY)
BASO#: 0 10*3/uL (ref 0.0–0.2)
BASO%: 0.5 % (ref 0.0–2.0)
EOS%: 4.5 % (ref 0.0–7.0)
Eosinophils Absolute: 0.3 10*3/uL (ref 0.0–0.5)
HEMATOCRIT: 35.7 % (ref 34.8–46.6)
HEMOGLOBIN: 11.9 g/dL (ref 11.6–15.9)
LYMPH#: 1.9 10*3/uL (ref 0.9–3.3)
LYMPH%: 30.3 % (ref 14.0–48.0)
MCH: 31.6 pg (ref 26.0–34.0)
MCHC: 33.3 g/dL (ref 32.0–36.0)
MCV: 95 fL (ref 81–101)
MONO#: 0.4 10*3/uL (ref 0.1–0.9)
MONO%: 7.1 % (ref 0.0–13.0)
NEUT%: 57.6 % (ref 39.6–80.0)
NEUTROS ABS: 3.6 10*3/uL (ref 1.5–6.5)
Platelets: 275 10*3/uL (ref 145–400)
RBC: 3.77 10*6/uL (ref 3.70–5.32)
RDW: 14 % (ref 11.1–15.7)
WBC: 6.2 10*3/uL (ref 3.9–10.0)

## 2016-06-19 LAB — COMPREHENSIVE METABOLIC PANEL (CC13)
ALT: 134 IU/L — ABNORMAL HIGH (ref 0–32)
AST: 78 IU/L — AB (ref 0–40)
Albumin, Serum: 3.9 g/dL (ref 3.5–5.5)
Albumin/Globulin Ratio: 1.1 — ABNORMAL LOW (ref 1.2–2.2)
Alkaline Phosphatase, S: 179 IU/L — ABNORMAL HIGH (ref 39–117)
BILIRUBIN TOTAL: 0.2 mg/dL (ref 0.0–1.2)
BUN / CREAT RATIO: 23 (ref 9–23)
BUN: 16 mg/dL (ref 6–24)
CHLORIDE: 100 mmol/L (ref 96–106)
Calcium, Ser: 9.6 mg/dL (ref 8.7–10.2)
Carbon Dioxide, Total: 30 mmol/L — ABNORMAL HIGH (ref 18–29)
Creatinine, Ser: 0.71 mg/dL (ref 0.57–1.00)
GFR calc Af Amer: 108 mL/min/{1.73_m2} (ref >59)
GFR calc non Af Amer: 94 mL/min/{1.73_m2} (ref >59)
GLOBULIN, TOTAL: 3.4 g/dL (ref 1.5–4.5)
GLUCOSE: 122 mg/dL — AB (ref 65–99)
Potassium, Ser: 5.1 mmol/L (ref 3.5–5.2)
SODIUM: 136 mmol/L (ref 134–144)
Total Protein: 7.3 g/dL (ref 6.0–8.5)

## 2016-06-19 NOTE — Progress Notes (Signed)
Hematology and Oncology Follow Up Visit  Dana [name] [id number] Jun 18, 1955 61 y.o. 06/19/2016   Principle Diagnosis:  Iron deficiency anemia Poorly controlled diabetes type II  Current Therapy:   IV iron as indicated - last received in December 2017     Interim History:  Dana [name] is here today for a follow-up. She had her right foot imitated back in December. She got through surgery without any problems. She was in the hospital for a few days.  She has a healing abscess on the bottom of her left foot.  Hopefully, she will get a prosthesis for her right foot.  Her blood sugars are doing better. I would think that they would be doing better given that her infection is resolved.   She's had no bleeding. She's had no bruising.   Overall, I see that her performance status is ECOG 1    Medications:  Allergies as of 06/19/2016      Reactions   Lithium Nausea And Vomiting, Other (See Comments)   Other reaction(s): Other (See Comments) Can not keep this medication down. It makes her terribly ill. Can not keep this medication down. It makes her terribly ill.   Ativan [lorazepam] Other (See Comments)   Other reaction(s): ANAPHYLAXIS Other reaction(s): Other (See Comments) Abnormal behavior Abnormal behavior   Demerol [meperidine] Other (See Comments)   Other reaction(s): ANAPHYLAXIS Other reaction(s): Other (See Comments) Abnormal behavior Abnormal behavior   Oxycodone Other (See Comments)   Other reaction(s): OTHER Abnormal behavior   Darvocet [propoxyphene N-acetaminophen] Itching   Latex Itching   Other reaction(s): OTHER   Propoxyphene Itching      Medication List       Accurate as of 06/19/16  2:08 PM. Always use your most recent med list.          aspirin 325 MG EC tablet Take 325 mg by mouth daily.   atorvastatin 20 MG tablet Commonly known as:  LIPITOR Take 1 tablet (20 mg total) by mouth at bedtime.   escitalopram 10 MG tablet Commonly known as:   LEXAPRO Take 30 mg by mouth daily.   freestyle lancets Use as instructed to check blood sugar 3 times per day dx code E11.39   FREESTYLE LITE Devi Use to check blood sugar 3 times a day dx code E11.39   gabapentin 600 MG tablet Commonly known as:  NEURONTIN TAKE TWO TABLETS BY MOUTH THREE TIMES DAILY   glucose blood test strip Commonly known as:  FREESTYLE LITE Use as instructed to check blood sugar 3 times a day dx code E11.39   insulin detemir 100 UNIT/ML injection Commonly known as:  LEVEMIR 35 units nightly   insulin regular 250 units/2.57mL (100 units/mL) injection Commonly known as:  NOVOLIN R RELION Take 6 units with breakfast, 8 units with lunch and 8 units with dinner   INSULIN SYRINGE 1CC/29G 29G X 1/2" 1 ML Misc Use 1 per day.   metFORMIN 500 MG tablet Commonly known as:  GLUCOPHAGE TAKE TWO TABLETS (1000MG ) BY MOUTH IN THE MORNING AND ONE TABLET (500MG ) IN THE EVENING   OVER THE COUNTER MEDICATION Take 2 each by mouth daily. Centrum-Muli. Vitamin Gummy       Allergies:  Allergies  Allergen Reactions  . Lithium Nausea And Vomiting and Other (See Comments)    Other reaction(s): Other (See Comments) Can not keep this medication down. It makes her terribly ill. Can not keep this medication down. It makes her terribly ill.  Marland Kitchen  Ativan [Lorazepam] Other (See Comments)    Other reaction(s): ANAPHYLAXIS Other reaction(s): Other (See Comments) Abnormal behavior Abnormal behavior  . Demerol [Meperidine] Other (See Comments)    Other reaction(s): ANAPHYLAXIS Other reaction(s): Other (See Comments) Abnormal behavior Abnormal behavior  . Oxycodone Other (See Comments)    Other reaction(s): OTHER Abnormal behavior  . Darvocet [Propoxyphene N-Acetaminophen] Itching  . Latex Itching    Other reaction(s): OTHER  . Propoxyphene Itching    Past Medical History, Surgical history, Social history, and Family History were reviewed and updated.  Review of  Systems: All other 10 point review of systems is negative.   Physical Exam:  weight is 166 lb (75.3 kg). Her oral temperature is 97.6 F (36.4 C). Her blood pressure is 123/77 and her pulse is 67. Her respiration is 18 and oxygen saturation is 100%.   Wt Readings from Last 3 Encounters:  06/19/16 166 lb (75.3 kg)  06/10/16 161 lb (73 kg)  04/16/16 160 lb 1.9 oz (72.6 kg)    Ocular: Sclerae unicteric, pupils equal, round and reactive to light Ear-nose-throat: Oropharynx clear, dentition fair Lymphatic: No cervical supraclavicular or axillary adenopathy Lungs no rales or rhonchi, good excursion bilaterally Heart regular rate and rhythm, no murmur appreciated Abd soft, nontender, positive bowel sounds, no liver or spleen tip palpated on exam, no fluid wave  MSK no focal spinal tenderness, no joint edema Neuro: non-focal, well-oriented, appropriate affect Breasts: Deferred  Lab Results  Component Value Date   WBC 6.2 06/19/2016   HGB 11.9 06/19/2016   HCT 35.7 06/19/2016   MCV 95 06/19/2016   PLT 275 06/19/2016   Lab Results  Component Value Date   FERRITIN 2,650 Result Confirmed by Automated Dilution. (H) 04/16/2016   IRON 26 (L) 04/16/2016   TIBC 191 (L) 04/16/2016   UIBC 165 04/16/2016   IRONPCTSAT 13 (L) 04/16/2016   Lab Results  Component Value Date   RETICCTPCT 1.3 02/04/2016   RBC 3.77 06/19/2016   No results found for: KPAFRELGTCHN, LAMBDASER, KAPLAMBRATIO No results found for: IGGSERUM, IGA, IGMSERUM No results found for: Odetta Pink, SPEI   Chemistry      Component Value Date/Time   NA 139 06/10/2016 1503   NA 139 04/16/2016 1328   K 4.7 06/10/2016 1503   K 5.0 04/16/2016 1328   CL 103 06/10/2016 1503   CL 100 02/14/2016 1419   CL 99 09/20/2015 1344   CO2 33 (H) 06/10/2016 1503   CO2 26 04/16/2016 1328   BUN 17 06/10/2016 1503   BUN 37.9 (H) 04/16/2016 1328   CREATININE 0.68 06/10/2016 1503    CREATININE 1.0 04/16/2016 1328      Component Value Date/Time   CALCIUM 9.2 06/10/2016 1503   CALCIUM 9.9 04/16/2016 1328   ALKPHOS 197 (H) 04/16/2016 1328   AST 10 04/16/2016 1328   ALT 8 04/16/2016 1328   BILITOT 0.29 04/16/2016 1328     Impression and Plan: Dana [name] is a very pleasant 61 yo white female with history of iron deficiency anemia and erythropoietin deficiency secondary to uncontrolled diabetes.   Her hemoglobin is doing so much better. I think that the iron that she got back in December helped but more so, I think just having the right foot amputation and getting rid of the infection with the right foot has done a whole lot.   I do not think that we have to give her any iron. She does not need  any Aranesp.   we will plan to get her back now in 3 months. I think this would be very reasonable.    Volanda Napoleon, MD 2/8/20182:08 PM

## 2016-06-20 ENCOUNTER — Telehealth: Payer: Self-pay | Admitting: *Deleted

## 2016-06-20 LAB — RETICULOCYTES: Reticulocyte Count: 1.2 % (ref 0.6–2.6)

## 2016-06-20 LAB — IRON AND TIBC
%SAT: 28 % (ref 21–57)
Iron: 72 ug/dL (ref 41–142)
TIBC: 254 ug/dL (ref 236–444)
UIBC: 182 ug/dL (ref 120–384)

## 2016-06-20 LAB — FERRITIN: Ferritin: 981 ng/ml — ABNORMAL HIGH (ref 9–269)

## 2016-06-20 LAB — ERYTHROPOIETIN: Erythropoietin: 13.8 m[IU]/mL (ref 2.6–18.5)

## 2016-06-20 NOTE — Telephone Encounter (Signed)
-----   Message from Eliezer Bottom, NP sent at 06/20/2016  9:32 AM EST ----- Regarding: Iron  Iron improved no infusion needed. Thank you!  Dana [name]  ----- Message ----- From: Interface, Lab In Three Zero One Sent: 06/19/2016   1:29 PM To: Eliezer Bottom, NP

## 2016-06-23 ENCOUNTER — Telehealth: Payer: Self-pay | Admitting: Physician Assistant

## 2016-06-23 NOTE — Telephone Encounter (Signed)
Patient states she is returning to [place] from the other day.  Please return call to patient.

## 2016-06-23 NOTE — Telephone Encounter (Signed)
Patient had already gotten information from [place].   I reviewed with her just to make sure.

## 2016-06-25 ENCOUNTER — Telehealth: Payer: Self-pay | Admitting: Physician Assistant

## 2016-06-25 NOTE — Telephone Encounter (Signed)
Patient states that a form was faxed to you from [facility] last week to clear her for cataract surgery. She is checking the status. Please follow up with patient.

## 2016-06-25 NOTE — Telephone Encounter (Signed)
Received the form from [facility] for surgical clearance. Status in progress.

## 2016-06-26 ENCOUNTER — Telehealth: Payer: Self-pay | Admitting: Physician Assistant

## 2016-06-26 NOTE — Telephone Encounter (Signed)
Patient would like to know if [place] sent the paperwork to [place] or [place] for [employer]? Please call patient back with that information. Do not send a message through MyChart because patient can not get into hers.   Thank you.

## 2016-06-26 NOTE — Telephone Encounter (Signed)
Noted.   pt would like for the paperwork to go to [employer] Fax # [phone].

## 2016-06-26 NOTE — Telephone Encounter (Signed)
Just received paperwork and have it for review. Will be sending to them tomorrow along with recent office visits note once I have completed.

## 2016-06-26 NOTE — Telephone Encounter (Signed)
Please advise if you remember?

## 2016-06-27 NOTE — Telephone Encounter (Signed)
Patient is calling to follow up. Advised of below notes

## 2016-06-29 DIAGNOSIS — M86271 Subacute osteomyelitis, right ankle and foot: Secondary | ICD-10-CM | POA: Diagnosis not present

## 2016-06-30 NOTE — Telephone Encounter (Signed)
Forms were completed and faxed in.  Noted her recent labs with Dr. Marin Olp, she had elevation in liver enzymes. Has she been contacted by his office regarding this? Is she scheduled for repeat labs? If not, we would want to check her labs this week.

## 2016-06-30 NOTE — Telephone Encounter (Signed)
Spoke with patient and advised her forms were faxed. She has not heard anything from Dr Marin Olp about her lab results. Advised her that the liver enzymes were elevated. She still in the process of scheduling the CT scan. Advised will need to recheck liver enzymes this week. If we can get the CT scheduled this week then we can recheck liver enzymes with follow up labs after Ct.

## 2016-07-01 DIAGNOSIS — E11621 Type 2 diabetes mellitus with foot ulcer: Secondary | ICD-10-CM | POA: Diagnosis not present

## 2016-07-01 DIAGNOSIS — L97521 Non-pressure chronic ulcer of other part of left foot limited to breakdown of skin: Secondary | ICD-10-CM | POA: Diagnosis not present

## 2016-07-01 DIAGNOSIS — L97522 Non-pressure chronic ulcer of other part of left foot with fat layer exposed: Secondary | ICD-10-CM | POA: Diagnosis not present

## 2016-07-03 ENCOUNTER — Ambulatory Visit (HOSPITAL_BASED_OUTPATIENT_CLINIC_OR_DEPARTMENT_OTHER)
Admission: RE | Admit: 2016-07-03 | Discharge: 2016-07-03 | Disposition: A | Discharge status: Home / Self Care | Payer: PPO | Source: Ambulatory Visit | Attending: Physician Assistant | Admitting: Physician Assistant

## 2016-07-03 ENCOUNTER — Encounter (HOSPITAL_BASED_OUTPATIENT_CLINIC_OR_DEPARTMENT_OTHER): Payer: Self-pay

## 2016-07-03 DIAGNOSIS — I700 Atherosclerosis of aorta: Secondary | ICD-10-CM | POA: Insufficient documentation

## 2016-07-03 DIAGNOSIS — R0989 Other specified symptoms and signs involving the circulatory and respiratory systems: Secondary | ICD-10-CM

## 2016-07-03 DIAGNOSIS — R918 Other nonspecific abnormal finding of lung field: Secondary | ICD-10-CM | POA: Insufficient documentation

## 2016-07-03 DIAGNOSIS — I728 Aneurysm of other specified arteries: Secondary | ICD-10-CM | POA: Diagnosis not present

## 2016-07-03 DIAGNOSIS — K8020 Calculus of gallbladder without cholecystitis without obstruction: Secondary | ICD-10-CM | POA: Insufficient documentation

## 2016-07-03 MED ORDER — IOPAMIDOL (ISOVUE-370) INJECTION 76%
100.0000 mL | Freq: Once | INTRAVENOUS | Status: AC | PRN
  Administered 2016-07-03: 100 mL via INTRAVENOUS

## 2016-07-08 DIAGNOSIS — I10 Essential (primary) hypertension: Secondary | ICD-10-CM | POA: Diagnosis not present

## 2016-07-08 DIAGNOSIS — H25811 Combined forms of age-related cataract, right eye: Secondary | ICD-10-CM | POA: Diagnosis not present

## 2016-07-08 DIAGNOSIS — Z79899 Other long term (current) drug therapy: Secondary | ICD-10-CM | POA: Diagnosis not present

## 2016-07-08 DIAGNOSIS — G629 Polyneuropathy, unspecified: Secondary | ICD-10-CM | POA: Diagnosis not present

## 2016-07-08 DIAGNOSIS — E669 Obesity, unspecified: Secondary | ICD-10-CM | POA: Diagnosis not present

## 2016-07-08 DIAGNOSIS — Z7982 Long term (current) use of aspirin: Secondary | ICD-10-CM | POA: Diagnosis not present

## 2016-07-08 DIAGNOSIS — E1169 Type 2 diabetes mellitus with other specified complication: Secondary | ICD-10-CM | POA: Diagnosis not present

## 2016-07-09 ENCOUNTER — Other Ambulatory Visit: Payer: Self-pay | Admitting: Physician Assistant

## 2016-07-09 DIAGNOSIS — I728 Aneurysm of other specified arteries: Secondary | ICD-10-CM

## 2016-07-09 DIAGNOSIS — E1142 Type 2 diabetes mellitus with diabetic polyneuropathy: Secondary | ICD-10-CM

## 2016-07-10 ENCOUNTER — Other Ambulatory Visit: Payer: PPO

## 2016-07-11 ENCOUNTER — Other Ambulatory Visit (INDEPENDENT_AMBULATORY_CARE_PROVIDER_SITE_OTHER): Payer: PPO

## 2016-07-11 DIAGNOSIS — E1142 Type 2 diabetes mellitus with diabetic polyneuropathy: Secondary | ICD-10-CM | POA: Diagnosis not present

## 2016-07-11 LAB — COMPREHENSIVE METABOLIC PANEL
ALBUMIN: 4 g/dL (ref 3.5–5.2)
ALT: 20 U/L (ref 0–35)
AST: 16 U/L (ref 0–37)
Alkaline Phosphatase: 101 U/L (ref 39–117)
BUN: 23 mg/dL (ref 6–23)
CALCIUM: 9.7 mg/dL (ref 8.4–10.5)
CHLORIDE: 100 meq/L (ref 96–112)
CO2: 32 meq/L (ref 19–32)
Creatinine, Ser: 0.86 mg/dL (ref 0.40–1.20)
GFR: 71.67 mL/min (ref >60.00)
Glucose, Bld: 185 mg/dL — ABNORMAL HIGH (ref 70–99)
POTASSIUM: 4.8 meq/L (ref 3.5–5.1)
Sodium: 138 mEq/L (ref 135–145)
Total Bilirubin: 0.3 mg/dL (ref 0.2–1.2)
Total Protein: 7.3 g/dL (ref 6.0–8.3)

## 2016-07-15 DIAGNOSIS — H04123 Dry eye syndrome of bilateral lacrimal glands: Secondary | ICD-10-CM | POA: Diagnosis not present

## 2016-07-15 DIAGNOSIS — L97522 Non-pressure chronic ulcer of other part of left foot with fat layer exposed: Secondary | ICD-10-CM | POA: Diagnosis not present

## 2016-07-15 DIAGNOSIS — E11621 Type 2 diabetes mellitus with foot ulcer: Secondary | ICD-10-CM | POA: Diagnosis not present

## 2016-07-15 DIAGNOSIS — L97521 Non-pressure chronic ulcer of other part of left foot limited to breakdown of skin: Secondary | ICD-10-CM | POA: Diagnosis not present

## 2016-07-17 ENCOUNTER — Ambulatory Visit: Payer: PPO | Admitting: Endocrinology

## 2016-07-21 ENCOUNTER — Ambulatory Visit: Payer: PPO | Admitting: Endocrinology

## 2016-07-24 ENCOUNTER — Encounter: Payer: Self-pay | Admitting: Vascular Surgery

## 2016-07-27 DIAGNOSIS — M86271 Subacute osteomyelitis, right ankle and foot: Secondary | ICD-10-CM | POA: Diagnosis not present

## 2016-07-31 ENCOUNTER — Telehealth: Payer: Self-pay | Admitting: Physician Assistant

## 2016-07-31 NOTE — Telephone Encounter (Signed)
Left pt message asking to call Allison back directly at [phone] to schedule AWV. Thanks! °

## 2016-08-01 DIAGNOSIS — E119 Type 2 diabetes mellitus without complications: Secondary | ICD-10-CM | POA: Diagnosis not present

## 2016-08-05 ENCOUNTER — Ambulatory Visit (INDEPENDENT_AMBULATORY_CARE_PROVIDER_SITE_OTHER): Payer: PPO | Admitting: Vascular Surgery

## 2016-08-05 ENCOUNTER — Encounter: Payer: Self-pay | Admitting: Vascular Surgery

## 2016-08-05 VITALS — BP 127/82 | HR 72 | Temp 97.1°F | Resp 18 | Ht 61.0 in | Wt 166.0 lb

## 2016-08-05 DIAGNOSIS — I728 Aneurysm of other specified arteries: Secondary | ICD-10-CM | POA: Diagnosis not present

## 2016-08-05 NOTE — Progress Notes (Signed)
[facility] [place]  Patient name: Dana [name] MRN: [id number] DOB: [birth year] Sex: female  REASON FOR CONSULT: Evaluation of splenic artery aneurysm  HPI: Dana [name] is a 61 y.o. female, who is here today for discussion of splenic artery aneurysm. She is very pleasant 61 year old female who had incidental finding of the splenic artery aneurysm over one year ago with the CT scan looking at renal perfusion. She had a recent follow-up study and this showed no change. Maximal diameter is 8 mm. She has no symptoms referable to her splenic artery aneurysm. Does have severe long history of diabetes and has had diabetic foot infection bilaterally. This resulted in a transmetatarsal amputation of her foot on the right. No history of peripheral vascular occlusive disease. Fortunately she does not smoke cigarettes.  Past Medical History:  Diagnosis Date  . Bipolar 1 disorder ([place])   . Cellulitis and abscess of foot 03/08/2015  . Diabetes mellitus    INSULIN DEPENDENT  . Diabetic neuropathy ([place])   . Diabetic neuropathy ([place])   . 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 H/O hiatal hernia   . Hyperlipidemia   . Hypertension    past hx of  . Iron malabsorption 09/26/2015  . Numbness and tingling    Hx; of in B/LLE and B/LUE  . Other iron deficiency anemias 09/20/2015    Family History  Problem Relation Age of Onset  . Diabetes Mother   . Mental illness Mother   . Bipolar disorder Mother   . Hypertension Father   . Diabetes Maternal Grandmother   . Heart disease Neg Hx     SOCIAL HISTORY: Social History   Social History  . Marital status: Married    Spouse name: N/A  . Number of children: 2  . Years of education: 14   Occupational History  .      disabled   Social History Main Topics  . Smoking status: Never Smoker  . Smokeless tobacco: Never Used  . Alcohol use No  . Drug use: No  . Sexual activity: No    Other Topics Concern  . Not on file   Social History Narrative   Lives with husband in home   Caffeine use - Coke 2 or 3 16 oz daily    Allergies  Allergen Reactions  . Lithium Nausea And Vomiting and Other (See Comments)    Other reaction(s): Other (See Comments) Can not keep this medication down. It makes her terribly ill. Can not keep this medication down. It makes her terribly ill.  . Ativan [Lorazepam] Other (See Comments)    Other reaction(s): ANAPHYLAXIS Other reaction(s): Other (See Comments) Abnormal behavior Abnormal behavior  . Demerol [Meperidine] Other (See Comments)    Other reaction(s): ANAPHYLAXIS Other reaction(s): Other (See Comments) Abnormal behavior Abnormal behavior  . Oxycodone Other (See Comments)    Other reaction(s): OTHER Abnormal behavior  . Darvocet [Propoxyphene N-Acetaminophen] Itching  . Latex Itching    Other reaction(s): OTHER  . Propoxyphene Itching    Current Outpatient Prescriptions  Medication Sig Dispense Refill  . aspirin 325 MG EC tablet Take 325 mg by mouth daily.    Marland Kitchen atorvastatin (LIPITOR) 20 MG tablet Take 1 tablet (20 mg total) by mouth at bedtime. 30 tablet 5  . Blood Glucose Monitoring Suppl (FREESTYLE LITE) DEVI Use to check blood sugar 3 times a day dx code E11.39 1 each 0  . escitalopram (LEXAPRO) 10 MG tablet Take 30 mg  by mouth daily.     Marland Kitchen gabapentin (NEURONTIN) 600 MG tablet TAKE TWO TABLETS BY MOUTH THREE TIMES DAILY 180 tablet 3  . glucose blood (FREESTYLE LITE) test strip Use as instructed to check blood sugar 3 times a day dx code E11.39 100 each 3  . insulin detemir (LEVEMIR) 100 UNIT/ML injection 35 units nightly 10 mL 2  . insulin regular (NOVOLIN R RELION) 100 units/mL injection Take 6 units with breakfast, 8 units with lunch and 8 units with dinner (Patient taking differently: Inject 4-10 Units into the skin 3 (three) times daily before meals. Per meal size, small meals = less pumps to Vgo and large meal =  more pumps to Vgo. 1 pump = 2 units.) 10 mL 3  . INSULIN SYRINGE 1CC/29G 29G X 1/2" 1 ML MISC Use 1 per day. 100 each 2  . Lancets (FREESTYLE) lancets Use as instructed to check blood sugar 3 times per day dx code E11.39 100 each 3  . metFORMIN (GLUCOPHAGE) 500 MG tablet TAKE TWO TABLETS (1000MG ) BY MOUTH IN THE MORNING AND ONE TABLET (500MG ) IN THE EVENING 90 tablet 5  . OVER THE COUNTER MEDICATION Take 2 each by mouth daily. Centrum-Muli. Vitamin Gummy     No current facility-administered medications for this visit.     REVIEW OF SYSTEMS:  [X]  denotes positive finding, [ ]  denotes negative finding Cardiac  Comments:  Chest pain or chest pressure:    Shortness of breath upon exertion:    Short of breath when lying flat:    Irregular heart rhythm:        Vascular    Pain in calf, thigh, or hip brought on by ambulation:    Pain in feet at night that wakes you up from your sleep:     Blood clot in your veins: x   Leg swelling:         Pulmonary    Oxygen at home:    Productive cough:     Wheezing:         Neurologic    Sudden weakness in arms or legs:     Sudden numbness in arms or legs:     Sudden onset of difficulty speaking or slurred speech:    Temporary loss of vision in one eye:     Problems with dizziness:         Gastrointestinal    Blood in stool:     Vomited blood:         Genitourinary    Burning when urinating:     Blood in urine:        Psychiatric    Major depression:         Hematologic    Bleeding problems: x   Problems with blood clotting too easily:        Skin    Rashes or ulcers:        Constitutional    Fever or chills:      PHYSICAL EXAM: Vitals:   08/05/16 1124  BP: 127/82  Pulse: 72  Resp: 18  Temp: 97.1 F (36.2 C)  TempSrc: Oral  SpO2: 99%  Weight: 166 lb (75.3 kg)  Height: 5\' 1"  (1.549 m)    GENERAL: The patient is a well-nourished female, in no acute distress. The vital signs are documented above. CARDIOVASCULAR:  Carotid arteries without bruits bilaterally. 2+ radial pulses. 2+ femoral pulses. PULMONARY: There is good air exchange  ABDOMEN: Soft and non-tender . No  bruits and no aneurysm palpable MUSCULOSKELETAL: Partial foot amputation on the right. Dark issue on the left. An whenever the images with the patient. This does show NEUROLOGIC: No focal weakness or paresthesias are detected. SKIN: There are no ulcers or rashes noted. PSYCHIATRIC: The patient has a normal affect.  DATA:  I reviewed the recent CT scan with the patient. Reviewed the actual images. This does show a small 8 mm partially calcified splenic artery aneurysm which is distal but away from the splenic hilum.  MEDICAL ISSUES: Small 8 mm splenic artery aneurysm with no change and a CT scan from one year ago. I discussed this at length with patient. Explained extremely low risk over time associated with this. Also explained that she is now had a demonstration of no change over a year. Would repeat her CT scan in 3 years to confirm no progressive growth. Would then have to have serial CT scans for follow-up. I did review symptoms should she have a splenic artery aneurysm rupture which would be quite unlikely at this size. She knows to present emergency room immediately to the emergency room should this occur   Rosetta Posner, MD [facility] Tel [phone] Pager [phone]

## 2016-08-07 ENCOUNTER — Other Ambulatory Visit: Payer: Self-pay | Admitting: Endocrinology

## 2016-08-13 ENCOUNTER — Encounter: Payer: Self-pay | Admitting: Endocrinology

## 2016-08-13 ENCOUNTER — Ambulatory Visit (INDEPENDENT_AMBULATORY_CARE_PROVIDER_SITE_OTHER): Payer: PPO | Admitting: Endocrinology

## 2016-08-13 VITALS — BP 136/82 | HR 81 | Ht 61.0 in | Wt 173.0 lb

## 2016-08-13 DIAGNOSIS — E1165 Type 2 diabetes mellitus with hyperglycemia: Secondary | ICD-10-CM | POA: Diagnosis not present

## 2016-08-13 DIAGNOSIS — E11621 Type 2 diabetes mellitus with foot ulcer: Secondary | ICD-10-CM | POA: Diagnosis not present

## 2016-08-13 DIAGNOSIS — Z794 Long term (current) use of insulin: Secondary | ICD-10-CM | POA: Diagnosis not present

## 2016-08-13 DIAGNOSIS — E782 Mixed hyperlipidemia: Secondary | ICD-10-CM | POA: Diagnosis not present

## 2016-08-13 DIAGNOSIS — L97522 Non-pressure chronic ulcer of other part of left foot with fat layer exposed: Secondary | ICD-10-CM | POA: Diagnosis not present

## 2016-08-13 DIAGNOSIS — L97421 Non-pressure chronic ulcer of left heel and midfoot limited to breakdown of skin: Secondary | ICD-10-CM | POA: Diagnosis not present

## 2016-08-13 LAB — POCT GLYCOSYLATED HEMOGLOBIN (HGB A1C): Hemoglobin A1C: 6.7

## 2016-08-13 MED ORDER — ATORVASTATIN CALCIUM 20 MG PO TABS: 20.0000 mg | ORAL_TABLET | Freq: Every day | ORAL | 0 refills | Status: DC

## 2016-08-13 MED ORDER — INSULIN DETEMIR 100 UNIT/ML FLEXPEN: 35.0000 [IU] | PEN_INJECTOR | Freq: Every day | SUBCUTANEOUS | 2 refills | Status: DC

## 2016-08-13 NOTE — Patient Instructions (Signed)
May check sugar at bedtime too  If eating less carbs at supper take 8 units

## 2016-08-13 NOTE — Progress Notes (Signed)
Patient ID: Dana [name], female   DOB: 1955-06-15, 61 y.o.   MRN: [id number]           Reason for Appointment: Follow-up for Type 2 Diabetes  Referring physician: Elyn Aquas  History of Present Illness:          Date of diagnosis of type 2 diabetes mellitus: 2006       Background history:   She thinks her blood sugar was 300-400 at the time of diagnosis and she was started on insulin soon after this. She thinks she has been on metformin only for the last 5 years Her A1c history is available since only about 2013 and this had been consistently over 10% She  had  poor control of her diabetes with pre-consultation A1c of 13%, was on Levemir only  Although she did better with the V-go pump in 2016 she stopped this because of increased cost  Recent history:    Her A1c is now 6.7 which is better than the last when she had in 9/17 of 7.8  INSULIN regimen is: 35 Levemir at bedtime, regular insulin before meals: 12-17-08  Current blood sugar patterns, daily management and problems identified:  She has started using the Flex pen for the Levemir although a prescription is not visible on her record  However she still continues to take the regular insulin from [facility] for of cost reasons  She is trying to take the insulin 15-30 minutes before eating but sometimes will take it right before or right after eating  On her last visit she was supposed to cut back on drinking soft drinks and milk and she thinks she is cutting back significantly although not completely  Her blood sugars appear to be overall more STABLE compared to her last visit  Highest blood sugars are mostly after evening meal and she either gets larger meals or drinks a regular soft drinks  She has had a couple of low sugars around 2 AM but not recently                         Non-insulin hypoglycemic drugs the patient is taking are: Metformin 1g bid Side effects from medications have been: None  Compliance with the  medical regimen: Good Hypoglycemia: As above    Glucose monitoring:  done 2-3 times a day         Glucometer:  FreeStyle .      Blood Glucose readings by time of day  Mean values apply above for all meters except median for One Touch  PRE-MEAL Fasting Lunch Dinner Bedtime Overall  Glucose range: 120-204     103-290  47-290   Mean/median: 151  144  142  160  152     Self-care:  Typical meal intake: Breakfast is irregular otherwise may have toast and an egg or sausage.  Lunch is a sandwich or cheeseburger, evening meal is meat and 2 vegetables.  For snacks he will have peanut butter crackers in pms Dinner 7 pm     She has regular drinks 0-1/2 per day, milk 1 cups daily          Dietician visit, most recent: Never               Exercise: none  Weight history: Stable over the last 3-4 years, was 400 pounds about 5 years ago  Wt Readings from Last 3 Encounters:  08/13/16 173 lb (78.5 kg)  08/05/16 166  lb (75.3 kg)  06/19/16 166 lb (75.3 kg)    Glycemic control:   Lab Results  Component Value Date   HGBA1C 6.7 08/13/2016   HGBA1C 7.8 (H) 01/18/2016   HGBA1C 7.5 09/20/2015   Lab Results  Component Value Date   MICROALBUR 4.0 (H) 11/08/2014   LDLCALC 123 (H) 06/10/2016   CREATININE 0.86 07/11/2016       Allergies as of 08/13/2016      Reactions   Lithium Nausea And Vomiting, Other (See Comments)   Other reaction(s): Other (See Comments) Can not keep this medication down. It makes her terribly ill. Can not keep this medication down. It makes her terribly ill.   Ativan [lorazepam] Other (See Comments)   Other reaction(s): ANAPHYLAXIS Other reaction(s): Other (See Comments) Abnormal behavior Abnormal behavior   Demerol [meperidine] Other (See Comments)   Other reaction(s): ANAPHYLAXIS Other reaction(s): Other (See Comments) Abnormal behavior Abnormal behavior   Oxycodone Other (See Comments)   Other reaction(s): OTHER Abnormal behavior   Darvocet [propoxyphene  N-acetaminophen] Itching   Latex Itching   Other reaction(s): OTHER   Propoxyphene Itching      Medication List       Accurate as of 08/13/16 12:44 PM. Always use your most recent med list.          aspirin 325 MG EC tablet Take 325 mg by mouth daily.   atorvastatin 20 MG tablet Commonly known as:  LIPITOR Take 1 tablet (20 mg total) by mouth at bedtime.   escitalopram 10 MG tablet Commonly known as:  LEXAPRO Take 30 mg by mouth daily.   freestyle lancets Use as instructed to check blood sugar 3 times per day dx code E11.39   FREESTYLE LITE Devi Use to check blood sugar 3 times a day dx code E11.39   FREESTYLE LITE test strip Generic drug:  glucose blood USE AS INSTRUCTED TO CHECK BLOOD SUGAR THREE TIMES DAILY   gabapentin 600 MG tablet Commonly known as:  NEURONTIN TAKE TWO TABLETS BY MOUTH THREE TIMES DAILY   insulin detemir 100 UNIT/ML injection Commonly known as:  LEVEMIR 35 units nightly   insulin regular 250 units/2.75mL (100 units/mL) injection Commonly known as:  NOVOLIN R RELION Take 6 units with breakfast, 8 units with lunch and 8 units with dinner   INSULIN SYRINGE 1CC/29G 29G X 1/2" 1 ML Misc Use 1 per day.   metFORMIN 500 MG tablet Commonly known as:  GLUCOPHAGE TAKE TWO TABLETS (1000MG ) BY MOUTH IN THE MORNING AND ONE TABLET (500MG ) IN THE EVENING   OVER THE COUNTER MEDICATION Take 2 each by mouth daily. Centrum-Muli. Vitamin Gummy       Allergies:  Allergies  Allergen Reactions  . Lithium Nausea And Vomiting and Other (See Comments)    Other reaction(s): Other (See Comments) Can not keep this medication down. It makes her terribly ill. Can not keep this medication down. It makes her terribly ill.  . Ativan [Lorazepam] Other (See Comments)    Other reaction(s): ANAPHYLAXIS Other reaction(s): Other (See Comments) Abnormal behavior Abnormal behavior  . Demerol [Meperidine] Other (See Comments)    Other reaction(s): ANAPHYLAXIS Other  reaction(s): Other (See Comments) Abnormal behavior Abnormal behavior  . Oxycodone Other (See Comments)    Other reaction(s): OTHER Abnormal behavior  . Darvocet [Propoxyphene N-Acetaminophen] Itching  . Latex Itching    Other reaction(s): OTHER  . Propoxyphene Itching    Past Medical History:  Diagnosis Date  . Bipolar 1 disorder ([place])   .  Cellulitis and abscess of foot 03/08/2015  . Diabetes mellitus    INSULIN DEPENDENT  . Diabetic neuropathy ([place])   . Diabetic neuropathy ([place])   . 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 H/O hiatal hernia   . Hyperlipidemia   . Hypertension    past hx of  . Iron malabsorption 09/26/2015  . Numbness and tingling    Hx; of in B/LLE and B/LUE  . Other iron deficiency anemias 09/20/2015    Past Surgical History:  Procedure Laterality Date  . ABDOMINAL HYSTERECTOMY    . ADENOIDECTOMY     Hx: of  . AMPUTATION  10/24/2011   Procedure: AMPUTATION RAY;  Surgeon: Newt Minion, MD;  Location: [place];  Service: Orthopedics;  Laterality: Right;  Right Foot 3rd Ray Amputation  . AMPUTATION Right 12/16/2012   Procedure: Right Foot Transmetatarsal Amputation;  Surgeon: Newt Minion, MD;  Location: [place];  Service: Orthopedics;  Laterality: Right;  . AMPUTATION Left 03/09/2015   Procedure: LEFT FOOT 1ST RAY AMPUTATION;  Surgeon: Newt Minion, MD;  Location: [facility];  Service: Orthopedics;  Laterality: Left;  . BLADDER SURGERY     x 2, tacked 1st time; mesh "eroded", had to be removed  . Bladder Tact   2002  . BREAST SURGERY Left    "knot removed"  . CARPAL TUNNEL RELEASE Left   . COLON SURGERY    . NASAL SEPTUM SURGERY  1976  . TONSILLECTOMY     age 69's    Family History  Problem Relation Age of Onset  . Diabetes Mother   . Mental illness Mother   . Bipolar disorder Mother   . Hypertension Father   . Diabetes Maternal Grandmother   . Heart disease Neg Hx     Social History:  reports that she has never smoked. She has never used  smokeless tobacco. She reports that she does not drink alcohol or use drugs.    Review of Systems   Has diabetic neuropathy with sensory loss Her PCP increased her gabapentin and she thinks this is also helping her tremor  DIABETIC foot ulcers with history of amputations: She is being followed at the wound [facility] with bilateral ulcers  Orthopedic Surgeon did an amputation of the right mid foot in December  She has had more sensitivity of her feet when trying to stand or walk.   Has been on gabapentin and analgesics from PCP Could not tolerate Cymbalta She did previously benefit from a trial of capsaicin that was suggested    Lipid history: On treatment with Lipitor and followed by PCP Apparently had not been taking her Lipitor regularly on her last appointment and still does not have a prescription for refills    Lab Results  Component Value Date   CHOL 218 (H) 06/10/2016   HDL 56.70 06/10/2016   LDLCALC 123 (H) 06/10/2016   TRIG 190.0 (H) 06/10/2016   CHOLHDL 4 06/10/2016             Physical Examination:  BP 136/82   Pulse 81   Ht 5\' 1"  (1.549 m)   Wt 173 lb (78.5 kg)   SpO2 96%   BMI 32.69 kg/m        ASSESSMENT:  Diabetes type 2, uncontrolled     See history of present illness for detailed discussion of  current management, blood sugar patterns and problems identified  Her blood sugars are less variable Also her A1c appears to be improved at 6.7  Most likely this is related to her trying to be better with with her diet and avoiding a lot of carbohydrates, high-fat meals and drinking regular soft drinks and milk as much She does have some inconsistency with her mealtime insulin and may take it right after eating at times instead of 30 mins before Day-to-day management was discussed with her Highest blood sugars are mostly after supper when she is not watching her diet Also not adjusting her regular insulin based on what she is eating Hypoglycemia: Not clear  if this is related to late doses of insulin or decreased carbohydrate intake on some evenings, generally less in the last 2 weeks  HYPERLIPIDEMIA: Not controlled partly because of noncompliance with her Lipitor  Recommendations: She will need to be consistent with mealtime insulin before eating She can take 8 units instead of 10 at suppertime if eating smaller amount of carbohydrates or portions Continue Levemir same doses and uses Flex pen Refilled her Lipitor, she will have labs done later this month with her PCP  Patient Instructions  May check sugar at bedtime too  If eating less carbs at supper take 8 units     Counseling time on subjects discussed above is over 50% of today's 25 minute visit  Icker Swigert 08/13/2016, 12:44 PM   Note: This office note was prepared with Dragon voice recognition system technology. Any transcriptional errors that result from this process are unintentional.

## 2016-08-14 DIAGNOSIS — S91329A Laceration with foreign body, unspecified foot, initial encounter: Secondary | ICD-10-CM | POA: Diagnosis not present

## 2016-08-15 DIAGNOSIS — Z89511 Acquired absence of right leg below knee: Secondary | ICD-10-CM | POA: Diagnosis not present

## 2016-08-18 DIAGNOSIS — Z89431 Acquired absence of right foot: Secondary | ICD-10-CM | POA: Diagnosis not present

## 2016-08-18 DIAGNOSIS — R29898 Other symptoms and signs involving the musculoskeletal system: Secondary | ICD-10-CM | POA: Diagnosis not present

## 2016-08-18 DIAGNOSIS — R2689 Other abnormalities of gait and mobility: Secondary | ICD-10-CM | POA: Diagnosis not present

## 2016-08-18 DIAGNOSIS — Z4781 Encounter for orthopedic aftercare following surgical amputation: Secondary | ICD-10-CM | POA: Diagnosis not present

## 2016-08-21 ENCOUNTER — Other Ambulatory Visit: Payer: PPO

## 2016-08-21 ENCOUNTER — Ambulatory Visit: Payer: PPO | Admitting: Family

## 2016-08-25 DIAGNOSIS — L97522 Non-pressure chronic ulcer of other part of left foot with fat layer exposed: Secondary | ICD-10-CM | POA: Diagnosis not present

## 2016-08-25 DIAGNOSIS — L97812 Non-pressure chronic ulcer of other part of right lower leg with fat layer exposed: Secondary | ICD-10-CM | POA: Diagnosis not present

## 2016-08-25 DIAGNOSIS — L97312 Non-pressure chronic ulcer of right ankle with fat layer exposed: Secondary | ICD-10-CM | POA: Diagnosis not present

## 2016-08-25 DIAGNOSIS — E11622 Type 2 diabetes mellitus with other skin ulcer: Secondary | ICD-10-CM | POA: Diagnosis not present

## 2016-08-25 DIAGNOSIS — E11621 Type 2 diabetes mellitus with foot ulcer: Secondary | ICD-10-CM | POA: Diagnosis not present

## 2016-08-25 DIAGNOSIS — L97421 Non-pressure chronic ulcer of left heel and midfoot limited to breakdown of skin: Secondary | ICD-10-CM | POA: Diagnosis not present

## 2016-08-27 DIAGNOSIS — M86271 Subacute osteomyelitis, right ankle and foot: Secondary | ICD-10-CM | POA: Diagnosis not present

## 2016-08-27 DIAGNOSIS — M25571 Pain in right ankle and joints of right foot: Secondary | ICD-10-CM | POA: Diagnosis not present

## 2016-08-29 ENCOUNTER — Other Ambulatory Visit: Payer: Self-pay | Admitting: Physician Assistant

## 2016-09-01 LAB — HM DIABETES EYE EXAM

## 2016-09-02 ENCOUNTER — Other Ambulatory Visit: Payer: Self-pay

## 2016-09-02 ENCOUNTER — Telehealth: Payer: Self-pay | Admitting: Endocrinology

## 2016-09-02 DIAGNOSIS — H2513 Age-related nuclear cataract, bilateral: Secondary | ICD-10-CM | POA: Diagnosis not present

## 2016-09-02 DIAGNOSIS — H25812 Combined forms of age-related cataract, left eye: Secondary | ICD-10-CM | POA: Diagnosis not present

## 2016-09-02 DIAGNOSIS — E119 Type 2 diabetes mellitus without complications: Secondary | ICD-10-CM | POA: Diagnosis not present

## 2016-09-02 DIAGNOSIS — K219 Gastro-esophageal reflux disease without esophagitis: Secondary | ICD-10-CM | POA: Diagnosis not present

## 2016-09-02 DIAGNOSIS — Z79899 Other long term (current) drug therapy: Secondary | ICD-10-CM | POA: Diagnosis not present

## 2016-09-02 DIAGNOSIS — G4733 Obstructive sleep apnea (adult) (pediatric): Secondary | ICD-10-CM | POA: Diagnosis not present

## 2016-09-02 DIAGNOSIS — G629 Polyneuropathy, unspecified: Secondary | ICD-10-CM | POA: Diagnosis not present

## 2016-09-02 DIAGNOSIS — I10 Essential (primary) hypertension: Secondary | ICD-10-CM | POA: Diagnosis not present

## 2016-09-02 DIAGNOSIS — Z7984 Long term (current) use of oral hypoglycemic drugs: Secondary | ICD-10-CM | POA: Diagnosis not present

## 2016-09-02 DIAGNOSIS — Z794 Long term (current) use of insulin: Secondary | ICD-10-CM | POA: Diagnosis not present

## 2016-09-02 MED ORDER — INSULIN DETEMIR 100 UNIT/ML ~~LOC~~ SOLN: SUBCUTANEOUS | 4 refills | Status: DC

## 2016-09-02 NOTE — Telephone Encounter (Signed)
Instead of having the Insulin Detemir (LEVEMIR FLEXTOUCH) 100 UNIT/ML Pen patient is requesting the bottle instead. Patient states that the bottle would work more efficiently for her.   [place]

## 2016-09-02 NOTE — Telephone Encounter (Signed)
Ordered

## 2016-09-03 ENCOUNTER — Encounter: Payer: PPO | Admitting: Physician Assistant

## 2016-09-11 DIAGNOSIS — M869 Osteomyelitis, unspecified: Secondary | ICD-10-CM | POA: Diagnosis not present

## 2016-09-18 ENCOUNTER — Other Ambulatory Visit (HOSPITAL_BASED_OUTPATIENT_CLINIC_OR_DEPARTMENT_OTHER): Payer: PPO

## 2016-09-18 ENCOUNTER — Ambulatory Visit (HOSPITAL_BASED_OUTPATIENT_CLINIC_OR_DEPARTMENT_OTHER): Payer: PPO | Admitting: Family

## 2016-09-18 VITALS — BP 124/68 | HR 78 | Temp 98.0°F | Resp 18 | Wt 179.4 lb

## 2016-09-18 DIAGNOSIS — E119 Type 2 diabetes mellitus without complications: Secondary | ICD-10-CM | POA: Diagnosis not present

## 2016-09-18 DIAGNOSIS — Z89441 Acquired absence of right ankle: Secondary | ICD-10-CM | POA: Diagnosis not present

## 2016-09-18 DIAGNOSIS — E11311 Type 2 diabetes mellitus with unspecified diabetic retinopathy with macular edema: Secondary | ICD-10-CM | POA: Diagnosis not present

## 2016-09-18 DIAGNOSIS — N189 Chronic kidney disease, unspecified: Secondary | ICD-10-CM | POA: Diagnosis not present

## 2016-09-18 DIAGNOSIS — K909 Intestinal malabsorption, unspecified: Secondary | ICD-10-CM

## 2016-09-18 DIAGNOSIS — E1165 Type 2 diabetes mellitus with hyperglycemia: Secondary | ICD-10-CM | POA: Diagnosis not present

## 2016-09-18 DIAGNOSIS — D508 Other iron deficiency anemias: Secondary | ICD-10-CM | POA: Diagnosis not present

## 2016-09-18 DIAGNOSIS — D631 Anemia in chronic kidney disease: Secondary | ICD-10-CM

## 2016-09-18 DIAGNOSIS — M869 Osteomyelitis, unspecified: Secondary | ICD-10-CM | POA: Diagnosis not present

## 2016-09-18 DIAGNOSIS — IMO0002 Reserved for concepts with insufficient information to code with codable children: Secondary | ICD-10-CM

## 2016-09-18 LAB — CBC WITH DIFFERENTIAL (CANCER CENTER ONLY)
BASO#: 0 10*3/uL (ref 0.0–0.2)
BASO%: 0.5 % (ref 0.0–2.0)
EOS ABS: 0.3 10*3/uL (ref 0.0–0.5)
EOS%: 5 % (ref 0.0–7.0)
HEMATOCRIT: 32.8 % — AB (ref 34.8–46.6)
HEMOGLOBIN: 11.3 g/dL — AB (ref 11.6–15.9)
LYMPH#: 2.7 10*3/uL (ref 0.9–3.3)
LYMPH%: 43 % (ref 14.0–48.0)
MCH: 32.4 pg (ref 26.0–34.0)
MCHC: 34.5 g/dL (ref 32.0–36.0)
MCV: 94 fL (ref 81–101)
MONO#: 0.4 10*3/uL (ref 0.1–0.9)
MONO%: 6.8 % (ref 0.0–13.0)
NEUT%: 44.7 % (ref 39.6–80.0)
NEUTROS ABS: 2.8 10*3/uL (ref 1.5–6.5)
Platelets: 317 10*3/uL (ref 145–400)
RBC: 3.49 10*6/uL — ABNORMAL LOW (ref 3.70–5.32)
RDW: 12.7 % (ref 11.1–15.7)
WBC: 6.2 10*3/uL (ref 3.9–10.0)

## 2016-09-18 LAB — CMP (CANCER CENTER ONLY)
ALK PHOS: 97 U/L — AB (ref 26–84)
ALT: 24 U/L (ref 10–47)
AST: 23 U/L (ref 11–38)
Albumin: 3.1 g/dL — ABNORMAL LOW (ref 3.3–5.5)
BILIRUBIN TOTAL: 0.5 mg/dL (ref 0.20–1.60)
BUN: 21 mg/dL (ref 7–22)
CO2: 29 mEq/L (ref 18–33)
Calcium: 9.5 mg/dL (ref 8.0–10.3)
Chloride: 104 mEq/L (ref 98–108)
Creat: 0.9 mg/dl (ref 0.6–1.2)
GLUCOSE: 173 mg/dL — AB (ref 73–118)
POTASSIUM: 4.5 meq/L (ref 3.3–4.7)
SODIUM: 142 meq/L (ref 128–145)
Total Protein: 7 g/dL (ref 6.4–8.1)

## 2016-09-18 LAB — IRON AND TIBC
%SAT: 41 % (ref 21–57)
Iron: 90 ug/dL (ref 41–142)
TIBC: 216 ug/dL — ABNORMAL LOW (ref 236–444)
UIBC: 127 ug/dL (ref 120–384)

## 2016-09-18 LAB — FERRITIN: Ferritin: 609 ng/ml — ABNORMAL HIGH (ref 9–269)

## 2016-09-18 LAB — TECHNOLOGIST REVIEW CHCC SATELLITE

## 2016-09-18 NOTE — Progress Notes (Signed)
Hematology and Oncology Follow Up Visit  Dana [name] [id number] 1955-08-11 61 y.o. 09/18/2016   Principle Diagnosis:  Iron deficiency anemia Poorly controlled diabetes type II  Current Therapy:   IV iron as indicated - last received in December 2017    Interim History:  Dana [name] is here today for follow-up. She is needing clearance for her upcoming BKA with Dr. Len Childs. She had her foot removed at the ankle due to osteomyelitis in December 2017. She states that she has an ulcer of the right lower extremity stump that has not healed despite treatment with antibiotics. Dr. Len Childs is now recommending a BKA. Her counts today are stable. Hgb is 11.3, iron saturation 41% with a ferritin of 609.  She denies having had any episodes of bleeding or bruising. No lymphadenopathy found on exam.  She had a prothesis that she used for several weeks that she states worked well. She is now using a knee scooter and not doing any weightbearing activity with the right lower extremity.  No fever, chills, n/v, cough, rash, dizziness, SOB, chest pain, palpitations, abdominal pain or changes in bowel or bladder habits.  The neuropathy in her hands and left foot is unchanged. No c/o pain at this time.  She has maintained a good appetite and is staying well hydrated. Her weight is stable at this time.  She states that her blood sugars have been fairly well controlled ranging 100-140's.   ECOG Performance Status: 1 - Symptomatic but completely ambulatory  Medications:  Allergies as of 09/18/2016      Reactions   Lithium Nausea And Vomiting, Other (See Comments)   Other reaction(s): Other (See Comments) Can not keep this medication down. It makes her terribly ill. Can not keep this medication down. It makes her terribly ill.   Ativan [lorazepam] Other (See Comments)   Other reaction(s): ANAPHYLAXIS Other reaction(s): Other (See Comments) Abnormal behavior Abnormal behavior   Demerol [meperidine] Other  (See Comments)   Other reaction(s): ANAPHYLAXIS Other reaction(s): Other (See Comments) Abnormal behavior Abnormal behavior   Oxycodone Other (See Comments)   Other reaction(s): OTHER Abnormal behavior   Darvocet [propoxyphene N-acetaminophen] Itching   Latex Itching   Other reaction(s): OTHER   Propoxyphene Itching      Medication List       Accurate as of 09/18/16  1:41 PM. Always use your most recent med list.          aspirin 325 MG EC tablet Take 325 mg by mouth daily.   atorvastatin 20 MG tablet Commonly known as:  LIPITOR Take 1 tablet (20 mg total) by mouth at bedtime.   escitalopram 10 MG tablet Commonly known as:  LEXAPRO Take 30 mg by mouth daily.   freestyle lancets Use as instructed to check blood sugar 3 times per day dx code E11.39   FREESTYLE LITE Devi Use to check blood sugar 3 times a day dx code E11.39   FREESTYLE LITE test strip Generic drug:  glucose blood USE AS INSTRUCTED TO CHECK BLOOD SUGAR THREE TIMES DAILY   gabapentin 600 MG tablet Commonly known as:  NEURONTIN TAKE TWO TABLETS BY MOUTH THREE TIMES DAILY   insulin detemir 100 UNIT/ML injection Commonly known as:  LEVEMIR Inject 35 Units into the skin at bedtime.   insulin detemir 100 UNIT/ML injection Commonly known as:  LEVEMIR Inject 35 units in the skin daily at 10pm   insulin regular 100 units/mL injection Commonly known as:  NOVOLIN R RELION Take 6  units with breakfast, 8 units with lunch and 8 units with dinner   INSULIN SYRINGE 1CC/29G 29G X 1/2" 1 ML Misc Use 1 per day.   metFORMIN 500 MG tablet Commonly known as:  GLUCOPHAGE TAKE TWO TABLETS (1000MG ) BY MOUTH IN THE MORNING AND ONE TABLET (500MG ) IN THE EVENING   OVER THE COUNTER MEDICATION Take 2 each by mouth daily. Centrum-Muli. Vitamin Gummy       Allergies:  Allergies  Allergen Reactions  . Lithium Nausea And Vomiting and Other (See Comments)    Other reaction(s): Other (See Comments) Can not keep  this medication down. It makes her terribly ill. Can not keep this medication down. It makes her terribly ill.  . Ativan [Lorazepam] Other (See Comments)    Other reaction(s): ANAPHYLAXIS Other reaction(s): Other (See Comments) Abnormal behavior Abnormal behavior  . Demerol [Meperidine] Other (See Comments)    Other reaction(s): ANAPHYLAXIS Other reaction(s): Other (See Comments) Abnormal behavior Abnormal behavior  . Oxycodone Other (See Comments)    Other reaction(s): OTHER Abnormal behavior  . Darvocet [Propoxyphene N-Acetaminophen] Itching  . Latex Itching    Other reaction(s): OTHER  . Propoxyphene Itching    Past Medical History, Surgical history, Social history, and Family History were reviewed and updated.  Review of Systems: All other 10 point review of systems is negative.   Physical Exam:  weight is 179 lb 6.4 oz (81.4 kg). Her oral temperature is 98 F (36.7 C). Her blood pressure is 124/68 and her pulse is 78. Her respiration is 18 and oxygen saturation is 99%.   Wt Readings from Last 3 Encounters:  09/18/16 179 lb 6.4 oz (81.4 kg)  08/13/16 173 lb (78.5 kg)  08/05/16 166 lb (75.3 kg)    Ocular: Sclerae unicteric, pupils equal, round and reactive to light Ear-nose-throat: Oropharynx clear, dentition fair Lymphatic: No cervical, supraclavicular or axillary adenopathy Lungs no rales or rhonchi, good excursion bilaterally Heart regular rate and rhythm, no murmur appreciated Abd soft, nontender, positive bowel sounds, no liver or spleen tip palpated on exam, no fluid wave MSK no focal spinal tenderness, no joint edema Neuro: non-focal, well-oriented, appropriate affect Breasts: Deferred   Lab Results  Component Value Date   WBC 6.2 09/18/2016   HGB 11.3 (L) 09/18/2016   HCT 32.8 (L) 09/18/2016   MCV 94 09/18/2016   PLT 317 09/18/2016   Lab Results  Component Value Date   FERRITIN 609 (H) 09/18/2016   IRON 90 09/18/2016   TIBC 216 (L) 09/18/2016    UIBC 127 09/18/2016   IRONPCTSAT 41 09/18/2016   Lab Results  Component Value Date   RETICCTPCT 1.3 02/04/2016   RBC 3.49 (L) 09/18/2016   No results found for: KPAFRELGTCHN, LAMBDASER, KAPLAMBRATIO No results found for: IGGSERUM, IGA, IGMSERUM No results found for: Odetta Pink, SPEI   Chemistry      Component Value Date/Time   NA 142 09/18/2016 1052   NA 139 04/16/2016 1328   K 4.5 09/18/2016 1052   K 5.0 04/16/2016 1328   CL 104 09/18/2016 1052   CO2 29 09/18/2016 1052   CO2 26 04/16/2016 1328   BUN 21 09/18/2016 1052   BUN 37.9 (H) 04/16/2016 1328   CREATININE 0.9 09/18/2016 1052   CREATININE 1.0 04/16/2016 1328      Component Value Date/Time   CALCIUM 9.5 09/18/2016 1052   CALCIUM 9.9 04/16/2016 1328   ALKPHOS 97 (H) 09/18/2016 1052   ALKPHOS 197 (H)  04/16/2016 1328   AST 23 09/18/2016 1052   AST 10 04/16/2016 1328   ALT 24 09/18/2016 1052   ALT 8 04/16/2016 1328   BILITOT 0.50 09/18/2016 1052   BILITOT 0.29 04/16/2016 1328      Impression and Plan: Dana [name] is a very pleasant 61 yo caucasian female with history of iron deficiency anemia as well as erythropoietin deficiency due to poorly controlled diabetes. She is doing fairly well but has an ulcer on her right lower extremity, post amputation at the ankle for osteomyelitis, that wont heal. Dr. Len Childs plans to do a BKA and would like clearance from our office prior to surgery.  Her Hgb is stable at 11.3 with an MCV of 94. Her iron saturation is 41% with a ferritin of 609. I spoke with DR. Ennever and he reviewed her lab work as well. From our standpoint th patient is ok to proceed with surgery. I will route this note and lab work to Dr. Len Childs.  We will plan to see her back in 3 months for repeat lab work and follow-up.  She will contact our office with any questions or concerns. We can certainly see her sooner if need be.   Eliezer Bottom,  NP 5/10/20181:41 PM

## 2016-09-18 NOTE — Telephone Encounter (Signed)
Scheduled 10/01/16

## 2016-09-19 LAB — RETICULOCYTES: RETICULOCYTE COUNT: 2.3 % (ref 0.6–2.6)

## 2016-09-26 DIAGNOSIS — M86271 Subacute osteomyelitis, right ankle and foot: Secondary | ICD-10-CM | POA: Diagnosis not present

## 2016-10-01 ENCOUNTER — Encounter: Payer: Self-pay | Admitting: Physician Assistant

## 2016-10-01 ENCOUNTER — Ambulatory Visit (INDEPENDENT_AMBULATORY_CARE_PROVIDER_SITE_OTHER): Payer: PPO | Admitting: Physician Assistant

## 2016-10-01 VITALS — BP 129/84 | HR 83 | Temp 98.1°F | Resp 17 | Ht 61.0 in | Wt 181.2 lb

## 2016-10-01 DIAGNOSIS — E785 Hyperlipidemia, unspecified: Secondary | ICD-10-CM

## 2016-10-01 DIAGNOSIS — Z89432 Acquired absence of left foot: Secondary | ICD-10-CM

## 2016-10-01 DIAGNOSIS — Z0000 Encounter for general adult medical examination without abnormal findings: Secondary | ICD-10-CM

## 2016-10-01 DIAGNOSIS — F319 Bipolar disorder, unspecified: Secondary | ICD-10-CM

## 2016-10-01 DIAGNOSIS — Z01818 Encounter for other preprocedural examination: Secondary | ICD-10-CM

## 2016-10-01 DIAGNOSIS — E1142 Type 2 diabetes mellitus with diabetic polyneuropathy: Secondary | ICD-10-CM | POA: Diagnosis not present

## 2016-10-01 DIAGNOSIS — E1169 Type 2 diabetes mellitus with other specified complication: Secondary | ICD-10-CM | POA: Diagnosis not present

## 2016-10-01 DIAGNOSIS — E11311 Type 2 diabetes mellitus with unspecified diabetic retinopathy with macular edema: Secondary | ICD-10-CM | POA: Diagnosis not present

## 2016-10-01 DIAGNOSIS — E08319 Diabetes mellitus due to underlying condition with unspecified diabetic retinopathy without macular edema: Secondary | ICD-10-CM

## 2016-10-01 DIAGNOSIS — E1165 Type 2 diabetes mellitus with hyperglycemia: Secondary | ICD-10-CM

## 2016-10-01 DIAGNOSIS — D638 Anemia in other chronic diseases classified elsewhere: Secondary | ICD-10-CM

## 2016-10-01 LAB — COMPREHENSIVE METABOLIC PANEL
ALBUMIN: 4.1 g/dL (ref 3.5–5.2)
ALT: 13 U/L (ref 0–35)
AST: 13 U/L (ref 0–37)
Alkaline Phosphatase: 97 U/L (ref 39–117)
BUN: 35 mg/dL — AB (ref 6–23)
CHLORIDE: 105 meq/L (ref 96–112)
CO2: 30 meq/L (ref 19–32)
CREATININE: 0.94 mg/dL (ref 0.40–1.20)
Calcium: 10 mg/dL (ref 8.4–10.5)
GFR: 64.63 mL/min (ref >60.00)
GLUCOSE: 60 mg/dL — AB (ref 70–99)
Potassium: 6 mEq/L — ABNORMAL HIGH (ref 3.5–5.1)
Sodium: 141 mEq/L (ref 135–145)
Total Bilirubin: 0.3 mg/dL (ref 0.2–1.2)
Total Protein: 7.1 g/dL (ref 6.0–8.3)

## 2016-10-01 LAB — CBC
HCT: 32.9 % — ABNORMAL LOW (ref 36.0–46.0)
HEMOGLOBIN: 11.2 g/dL — AB (ref 12.0–15.0)
MCHC: 34.2 g/dL (ref 30.0–36.0)
MCV: 93.7 fl (ref 78.0–100.0)
PLATELETS: 380 10*3/uL (ref 150.0–400.0)
RBC: 3.51 Mil/uL — AB (ref 3.87–5.11)
RDW: 13.5 % (ref 11.5–15.5)
WBC: 9.5 10*3/uL (ref 4.0–10.5)

## 2016-10-01 LAB — URINALYSIS, ROUTINE W REFLEX MICROSCOPIC
BILIRUBIN URINE: NEGATIVE
Leukocytes, UA: NEGATIVE
NITRITE: NEGATIVE
Specific Gravity, Urine: >=1.03 — AB (ref 1.000–1.030)
Total Protein, Urine: 100 — AB
Urine Glucose: NEGATIVE
Urobilinogen, UA: 0.2 (ref 0.0–1.0)
pH: 5.5 (ref 5.0–8.0)

## 2016-10-01 LAB — TSH: TSH: 1.22 u[IU]/mL (ref 0.35–4.50)

## 2016-10-01 LAB — MICROALBUMIN / CREATININE URINE RATIO
CREATININE, U: 201 mg/dL
MICROALB/CREAT RATIO: 0.4 mg/g (ref 0.0–30.0)
Microalb, Ur: 0.8 mg/dL (ref 0.0–1.9)

## 2016-10-01 NOTE — Progress Notes (Signed)
Pre visit review using our clinic review tool, if applicable. No additional management support is needed unless otherwise documented below in the visit note. 

## 2016-10-01 NOTE — Progress Notes (Signed)
Patient presents to clinic today for annual exam.  Patient is fasting for labs. Patient is also here to determine clearance for upcoming surgical procedure. Patient is scheduled for a right BKA on 10/27/16.  Acute Concerns: Denies acute concerns today.  Chronic Issues: Diabetes Mellitus II -- Insulin-Dependent. With neuropathy and peripheral vascular disease. Patient is followed by Endocrinology (Dr. Dwyane Dee). Had recent visit with specialist. A1C at that time 6.7. Eye examination is up-to-date. + Retinopathy. Is overdue for foot examination. + history of foot ulcers. Is s/p amputation of L great toe and R forefoot. Again with upcoming plan for R BKA.  Hyperlipidemia -- Currently on Lipitor 20 mg daily. Is tolerating well. Ran out of medication about a week ago.   Anemia -- EPO deficiency secondary to CKD as well as iron malabsorption. Patient is followed by Hematology who she recently aw on 09/18/16. Hemoglobin has been stable.   Past Medical History:  Diagnosis Date  . Bipolar 1 disorder ([place])   . Cellulitis and abscess of foot 03/08/2015  . Diabetes mellitus    INSULIN DEPENDENT  . Diabetic neuropathy ([place])   . Diabetic neuropathy ([place])   . 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 H/O hiatal hernia   . Hyperlipidemia   . Hypertension    past hx of  . Iron malabsorption 09/26/2015  . Numbness and tingling    Hx; of in B/LLE and B/LUE  . Other iron deficiency anemias 09/20/2015    Past Surgical History:  Procedure Laterality Date  . ABDOMINAL HYSTERECTOMY    . ADENOIDECTOMY     Hx: of  . AMPUTATION  10/24/2011   Procedure: AMPUTATION RAY;  Surgeon: Newt Minion, MD;  Location: [facility];  Service: Orthopedics;  Laterality: Right;  Right Foot 3rd Ray Amputation  . AMPUTATION Right 12/16/2012   Procedure: Right Foot Transmetatarsal Amputation;  Surgeon: Newt Minion, MD;  Location: [facility];  Service: Orthopedics;  Laterality: Right;  . AMPUTATION Left 03/09/2015   Procedure:  LEFT FOOT 1ST RAY AMPUTATION;  Surgeon: Newt Minion, MD;  Location: [facility];  Service: Orthopedics;  Laterality: Left;  . BLADDER SURGERY     x 2, tacked 1st time; mesh "eroded", had to be removed  . Bladder Tact   2002  . BREAST SURGERY Left    "knot removed"  . CARPAL TUNNEL RELEASE Left   . COLON SURGERY    . NASAL SEPTUM SURGERY  1976  . TONSILLECTOMY     age 61's    Current Outpatient Prescriptions on File Prior to Visit  Medication Sig Dispense Refill  . aspirin 325 MG EC tablet Take 325 mg by mouth daily.    Marland Kitchen atorvastatin (LIPITOR) 20 MG tablet Take 1 tablet (20 mg total) by mouth at bedtime. 30 tablet 0  . Blood Glucose Monitoring Suppl (FREESTYLE LITE) DEVI Use to check blood sugar 3 times a day dx code E11.39 1 each 0  . FREESTYLE LITE test strip USE AS INSTRUCTED TO CHECK BLOOD SUGAR THREE TIMES DAILY 100 each 3  . gabapentin (NEURONTIN) 600 MG tablet TAKE TWO TABLETS BY MOUTH THREE TIMES DAILY 180 tablet 3  . insulin detemir (LEVEMIR) 100 UNIT/ML injection Inject 35 units in the skin daily at 10pm 20 mL 4  . insulin regular (NOVOLIN R RELION) 100 units/mL injection Take 6 units with breakfast, 8 units with lunch and 8 units with dinner (Patient taking differently: Inject 4-10 Units into the skin 3 (three) times daily  before meals. Per meal size, small meals = less pumps to Vgo and large meal = more pumps to Vgo. 1 pump = 2 units.) 10 mL 3  . INSULIN SYRINGE 1CC/29G 29G X 1/2" 1 ML MISC Use 1 per day. 100 each 2  . Lancets (FREESTYLE) lancets Use as instructed to check blood sugar 3 times per day dx code E11.39 100 each 3  . metFORMIN (GLUCOPHAGE) 500 MG tablet TAKE TWO TABLETS (1000MG) BY MOUTH IN THE MORNING AND ONE TABLET (500MG) IN THE EVENING 90 tablet 5  . OVER THE COUNTER MEDICATION Take 2 each by mouth daily. Centrum-Muli. Vitamin Gummy     No current facility-administered medications on file prior to visit.     Allergies  Allergen Reactions  . Lithium Nausea And  Vomiting and Other (See Comments)    Other reaction(s): Other (See Comments) Can not keep this medication down. It makes her terribly ill. Can not keep this medication down. It makes her terribly ill.  . Ativan [Lorazepam] Other (See Comments)    Other reaction(s): ANAPHYLAXIS Other reaction(s): Other (See Comments) Abnormal behavior Abnormal behavior  . Demerol [Meperidine] Other (See Comments)    Other reaction(s): ANAPHYLAXIS Other reaction(s): Other (See Comments) Abnormal behavior Abnormal behavior  . Oxycodone Other (See Comments)    Other reaction(s): OTHER Abnormal behavior  . Darvocet [Propoxyphene N-Acetaminophen] Itching  . Latex Itching    Other reaction(s): OTHER  . Propoxyphene Itching    Family History  Problem Relation Age of Onset  . Diabetes Mother   . Mental illness Mother   . Bipolar disorder Mother   . Hypertension Father   . Diabetes Maternal Grandmother   . Heart disease Neg Hx     Social History   Social History  . Marital status: Married    Spouse name: N/A  . Number of children: 2  . Years of education: 14   Occupational History  .      disabled   Social History Main Topics  . Smoking status: Never Smoker  . Smokeless tobacco: Never Used  . Alcohol use No  . Drug use: No  . Sexual activity: No   Other Topics Concern  . Not on file   Social History Narrative   Lives with husband in home   Caffeine use - Coke 2 or 3 16 oz daily   Review of Systems  Constitutional: Negative for fever and malaise/fatigue.  Eyes: Negative for blurred vision and double vision.  Respiratory: Negative for cough and shortness of breath.   Cardiovascular: Negative for chest pain, palpitations and orthopnea.  Neurological: Negative for dizziness and loss of consciousness.  Endo/Heme/Allergies: Negative for environmental allergies.  Psychiatric/Behavioral: Negative for depression, hallucinations, substance abuse and suicidal ideas. The patient is not  nervous/anxious and does not have insomnia.    BP 129/84   Pulse 83   Temp 98.1 F (36.7 C) (Oral)   Resp 17   Ht 5' 1" (1.549 m)   Wt 181 lb 4 oz (82.2 kg)   SpO2 98%   BMI 34.25 kg/m   Physical Exam  Constitutional: She is oriented to person, place, and time and well-developed, well-nourished, and in no distress.  HENT:  Head: Normocephalic and atraumatic.  Eyes: Conjunctivae are normal. Pupils are equal, round, and reactive to light.  Neck: Neck supple.  Cardiovascular: Normal rate, regular rhythm, normal heart sounds and intact distal pulses.   Pulmonary/Chest: Effort normal and breath sounds normal. No respiratory distress. She  has no wheezes. She has no rales. She exhibits no tenderness.  Musculoskeletal:  R lower extremity with partial foot amputation.  L extremity with great toe amputations. Healed plantar ulceration of L foot.   Neurological: She is alert and oriented to person, place, and time.  Skin: Skin is warm and dry.  Vitals reviewed.  Recent Results (from the past 2160 hour(s))  Comprehensive metabolic panel     Status: Abnormal   Collection Time: 07/11/16  1:43 PM  Result Value Ref Range   Sodium 138 135 - 145 mEq/L   Potassium 4.8 3.5 - 5.1 mEq/L   Chloride 100 96 - 112 mEq/L   CO2 32 19 - 32 mEq/L   Glucose, Bld 185 (H) 70 - 99 mg/dL   BUN 23 6 - 23 mg/dL   Creatinine, Ser 0.86 0.40 - 1.20 mg/dL   Total Bilirubin 0.3 0.2 - 1.2 mg/dL   Alkaline Phosphatase 101 39 - 117 U/L   AST 16 0 - 37 U/L   ALT 20 0 - 35 U/L   Total Protein 7.3 6.0 - 8.3 g/dL   Albumin 4.0 3.5 - 5.2 g/dL   Calcium 9.7 8.4 - 10.5 mg/dL   GFR 71.67 >60.00 mL/min  POCT glycosylated hemoglobin (Hb A1C)     Status: None   Collection Time: 08/13/16 11:37 AM  Result Value Ref Range   Hemoglobin A1C 6.7   HM DIABETES EYE EXAM     Status: Abnormal   Collection Time: 09/01/16 12:00 AM  Result Value Ref Range   HM Diabetic Eye Exam Retinopathy (A) No Retinopathy  CBC with  Differential [facility])     Status: Abnormal   Collection Time: 09/18/16 10:52 AM  Result Value Ref Range   WBC 6.2 3.9 - 10.0 10e3/uL   RBC 3.49 (L) 3.70 - 5.32 10e6/uL   HGB 11.3 (L) 11.6 - 15.9 g/dL   HCT 32.8 (L) 34.8 - 46.6 %   MCV 94 81 - 101 fL   MCH 32.4 26.0 - 34.0 pg   MCHC 34.5 32.0 - 36.0 g/dL   RDW 12.7 11.1 - 15.7 %   Platelets 317 145 - 400 10e3/uL   NEUT# 2.8 1.5 - 6.5 10e3/uL   LYMPH# 2.7 0.9 - 3.3 10e3/uL   MONO# 0.4 0.1 - 0.9 10e3/uL   Eosinophils Absolute 0.3 0.0 - 0.5 10e3/uL   BASO# 0.0 0.0 - 0.2 10e3/uL   NEUT% 44.7 39.6 - 80.0 %   LYMPH% 43.0 14.0 - 48.0 %   MONO% 6.8 0.0 - 13.0 %   EOS% 5.0 0.0 - 7.0 %   BASO% 0.5 0.0 - 2.0 %  CMP STAT ([facility] only)     Status: Abnormal   Collection Time: 09/18/16 10:52 AM  Result Value Ref Range   Sodium 142 128 - 145 mEq/L   Potassium 4.5 3.3 - 4.7 mEq/L   Chloride 104 98 - 108 mEq/L   CO2 29 18 - 33 mEq/L   Glucose, Bld 173 (H) 73 - 118 mg/dL   BUN, Bld 21 7 - 22 mg/dL   Creat 0.9 0.6 - 1.2 mg/dl   Total Bilirubin 0.50 0.20 - 1.60 mg/dl   Alkaline Phosphatase 97 (H) 26 - 84 U/L   AST 23 11 - 38 U/L   ALT(SGPT) 24 10 - 47 U/L   Total Protein 7.0 6.4 - 8.1 g/dL   Albumin 3.1 (L) 3.3 - 5.5 g/dL   Calcium 9.5 8.0 -  10.3 mg/dL  Reticulocytes     Status: None   Collection Time: 09/18/16 10:52 AM  Result Value Ref Range   Reticulocyte Count 2.3 0.6 - 2.6 %  Ferritin     Status: Abnormal   Collection Time: 09/18/16 10:52 AM  Result Value Ref Range   Ferritin 609 (H) 9 - 269 ng/ml  Iron and TIBC     Status: Abnormal   Collection Time: 09/18/16 10:52 AM  Result Value Ref Range   Iron 90 41 - 142 ug/dL   TIBC 216 (L) 236 - 444 ug/dL   UIBC 127 120 - 384 ug/dL   %SAT 41 21 - 57 %  TECHNOLOGIST REVIEW [facility]     Status: None   Collection Time: 09/18/16 10:52 AM  Result Value Ref Range   Tech Review Rare myelocyte   CBC     Status: Abnormal   Collection Time: 10/01/16  3:31 PM    Result Value Ref Range   WBC 9.5 4.0 - 10.5 K/uL   RBC 3.51 (L) 3.87 - 5.11 Mil/uL   Platelets 380.0 150.0 - 400.0 K/uL   Hemoglobin 11.2 (L) 12.0 - 15.0 g/dL   HCT 32.9 (L) 36.0 - 46.0 %   MCV 93.7 78.0 - 100.0 fl   MCHC 34.2 30.0 - 36.0 g/dL   RDW 13.5 11.5 - 15.5 %  Comp Met (CMET)     Status: Abnormal   Collection Time: 10/01/16  3:31 PM  Result Value Ref Range   Sodium 141 135 - 145 mEq/L   Potassium 6.0 (H) 3.5 - 5.1 mEq/L   Chloride 105 96 - 112 mEq/L   CO2 30 19 - 32 mEq/L   Glucose, Bld 60 (L) 70 - 99 mg/dL   BUN 35 (H) 6 - 23 mg/dL   Creatinine, Ser 0.94 0.40 - 1.20 mg/dL   Total Bilirubin 0.3 0.2 - 1.2 mg/dL   Alkaline Phosphatase 97 39 - 117 U/L   AST 13 0 - 37 U/L   ALT 13 0 - 35 U/L   Total Protein 7.1 6.0 - 8.3 g/dL   Albumin 4.1 3.5 - 5.2 g/dL   Calcium 10.0 8.4 - 10.5 mg/dL   GFR 64.63 >60.00 mL/min  TSH     Status: None   Collection Time: 10/01/16  3:31 PM  Result Value Ref Range   TSH 1.22 0.35 - 4.50 uIU/mL  Urinalysis, Routine w reflex microscopic     Status: Abnormal   Collection Time: 10/01/16  3:31 PM  Result Value Ref Range   Color, Urine YELLOW Yellow;Lt. Yellow   APPearance CLEAR Clear   Specific Gravity, Urine >=1.030 (A) 1.000 - 1.030   pH 5.5 5.0 - 8.0   Total Protein, Urine 100 (A) Negative   Urine Glucose NEGATIVE Negative   Ketones, ur TRACE (A) Negative   Bilirubin Urine NEGATIVE Negative   Hgb urine dipstick TRACE-INTACT (A) Negative   Urobilinogen, UA 0.2 0.0 - 1.0   Leukocytes, UA NEGATIVE Negative   Nitrite NEGATIVE Negative   WBC, UA 0-2/hpf 0-2/hpf   RBC / HPF 0-2/hpf 0-2/hpf   Mucus, UA Presence of (A) None   Squamous Epithelial / LPF Rare(0-4/hpf) Rare(0-4/hpf)  Urine Microalbumin w/creat. ratio     Status: None   Collection Time: 10/01/16  3:31 PM  Result Value Ref Range   Microalb, Ur 0.8 0.0 - 1.9 mg/dL   Creatinine,U 201.0 mg/dL   Microalb Creat Ratio 0.4 0.0 - 30.0 mg/g  Assessment/Plan: Anemia associated  with diabetes mellitus ([place]) Followed by Hematology. Hgb has been stable. A1C controlled. Repeat labs today.  Diabetic polyneuropathy associated with type 2 diabetes mellitus ([facility]) Severe. Is about to undergo R BKA due to history of osteomyelitis. Again reviewed appropriate care of lower extremities. Follow-up with orthopedics and podiatry as scheduled.   Diabetic retinopathy associated with diabetes mellitus due to underlying condition [facility]) Eye examination up-to-date. No acute concerns per patient.   DM (diabetes mellitus) type II uncontrolled with eye manifestation ([place]) Followed by Endo, now with good control over A1C. Health maintenance parameters are up-to-date. Unfortunately her long-standing history of uncontrolled diabetes has caused significant damage. Patient with neuropathy, nephropathy and retinopathy. Also with EPO-deficiency anemia 2/2 CKD. Repeat labs today to ensure stable renal function. Patient to follow-up with specialists as scheduled.   Bipolar 1 disorder ([place]) Followed by Psychiatry. Patient doing well. Continue current regimen. Follow-up with specialist as scheduled.   Hyperlipidemia LDL goal <100 Taking medications as directed. Repeat fasting labs today.  Partial nontraumatic amputation of foot Patient about to undergo BKA or R extremity. Vitals are stable. EKG with negative T wave in III. Otherwise unchanged from prior tracings. Will obtain labs today. Surgical clearance pending lab results.     [name], PA-C

## 2016-10-01 NOTE — Patient Instructions (Addendum)
Please go to the lab for blood work.   Our office will call you with your results unless you have chosen to receive results via MyChart.  If your blood work is normal we will follow-up each year for physicals and as scheduled for chronic medical problems.  If anything is abnormal we will treat accordingly and get you in for a follow-up.  Pending good results on labs, we will clear you for your procedure.   You are having your Wellness Visit with Dana [name] next today. .   Bring a copy of your advance directives to your next office visit.  Continue doing brain stimulating activities (puzzles, reading, adult coloring books, staying active) to keep memory sharp.    DASH Eating Plan DASH stands for "Dietary Approaches to Stop Hypertension." The DASH eating plan is a healthy eating plan that has been shown to reduce high blood pressure (hypertension). It may also reduce your risk for type 2 diabetes, heart disease, and stroke. The DASH eating plan may also help with weight loss. What are tips for following this plan? General guidelines   Avoid eating more than 2,300 mg (milligrams) of salt (sodium) a day. If you have hypertension, you may need to reduce your sodium intake to 1,500 mg a day.  Limit alcohol intake to no more than 1 drink a day for nonpregnant women and 2 drinks a day for men. One drink equals 12 oz of beer, 5 oz of wine, or 1 oz of hard liquor.  Work with your health care provider to maintain a healthy body weight or to lose weight. Ask what an ideal weight is for you.  Get at least 30 minutes of exercise that causes your heart to beat faster (aerobic exercise) most days of the week. Activities may include walking, swimming, or biking.  Work with your health care provider or diet and nutrition specialist (dietitian) to adjust your eating plan to your individual calorie needs. Reading food labels   Check food labels for the amount of sodium per serving. Choose foods with less  than 5 percent of the Daily Value of sodium. Generally, foods with less than 300 mg of sodium per serving fit into this eating plan.  To find whole grains, look for the word "whole" as the first word in the ingredient list. Shopping   Buy products labeled as "low-sodium" or "no salt added."  Buy fresh foods. Avoid canned foods and premade or frozen meals. Cooking   Avoid adding salt when cooking. Use salt-free seasonings or herbs instead of table salt or sea salt. Check with your health care provider or pharmacist before using salt substitutes.  Do not fry foods. Cook foods using healthy methods such as baking, boiling, grilling, and broiling instead.  Cook with heart-healthy oils, such as olive, canola, soybean, or sunflower oil. Meal planning    Eat a balanced diet that includes:  5 or more servings of fruits and vegetables each day. At each meal, try to fill half of your plate with fruits and vegetables.  Up to 6-8 servings of whole grains each day.  Less than 6 oz of lean meat, poultry, or fish each day. A 3-oz serving of meat is about the same size as a deck of cards. One egg equals 1 oz.  2 servings of low-fat dairy each day.  A serving of nuts, seeds, or beans 5 times each week.  Heart-healthy fats. Healthy fats called Omega-3 fatty acids are found in foods such as flaxseeds and  coldwater fish, like sardines, salmon, and mackerel.  Limit how much you eat of the following:  Canned or prepackaged foods.  Food that is high in trans fat, such as fried foods.  Food that is high in saturated fat, such as fatty meat.  Sweets, desserts, sugary drinks, and other foods with added sugar.  Full-fat dairy products.  Do not salt foods before eating.  Try to eat at least 2 vegetarian meals each week.  Eat more home-cooked food and less restaurant, buffet, and fast food.  When eating at a restaurant, ask that your food be prepared with less salt or no salt, if possible. What  foods are recommended? The items listed may not be a complete list. Talk with your dietitian about what dietary choices are best for you. Grains  Whole-grain or whole-wheat bread. Whole-grain or whole-wheat pasta. Brown rice. [place]. Bulgur. Whole-grain and low-sodium cereals. Pita bread. Low-fat, low-sodium crackers. Whole-wheat flour tortillas. Vegetables  Fresh or frozen vegetables (raw, steamed, roasted, or grilled). Low-sodium or reduced-sodium tomato and vegetable juice. Low-sodium or reduced-sodium tomato sauce and tomato paste. Low-sodium or reduced-sodium canned vegetables. Fruits  All fresh, dried, or frozen fruit. Canned fruit in natural juice (without added sugar). Meat and other protein foods  Skinless chicken or [place]. Ground chicken or [place]. Pork with fat trimmed off. Fish and seafood. Egg whites. Dried beans, peas, or lentils. Unsalted nuts, nut butters, and seeds. Unsalted canned beans. Lean cuts of beef with fat trimmed off. Low-sodium, lean deli meat. Dairy  Low-fat (1%) or fat-free (skim) milk. Fat-free, low-fat, or reduced-fat cheeses. Nonfat, low-sodium ricotta or cottage cheese. Low-fat or nonfat yogurt. Low-fat, low-sodium cheese. Fats and oils  Soft margarine without trans fats. Vegetable oil. Low-fat, reduced-fat, or light mayonnaise and salad dressings (reduced-sodium). Canola, safflower, olive, soybean, and sunflower oils. Avocado. Seasoning and other foods  Herbs. Spices. Seasoning mixes without salt. Unsalted popcorn and pretzels. Fat-free sweets. What foods are not recommended? The items listed may not be a complete list. Talk with your dietitian about what dietary choices are best for you. Grains  Baked goods made with fat, such as croissants, muffins, or some breads. Dry pasta or rice meal packs. Vegetables  Creamed or fried vegetables. Vegetables in a cheese sauce. Regular canned vegetables (not low-sodium or reduced-sodium). Regular canned tomato  sauce and paste (not low-sodium or reduced-sodium). Regular tomato and vegetable juice (not low-sodium or reduced-sodium). Angie Fava. Olives. Fruits  Canned fruit in a light or heavy syrup. Fried fruit. Fruit in cream or butter sauce. Meat and other protein foods  Fatty cuts of meat. Ribs. Fried meat. Berniece Salines. Sausage. Bologna and other processed lunch meats. Salami. Fatback. Hotdogs. Bratwurst. Salted nuts and seeds. Canned beans with added salt. Canned or smoked fish. Whole eggs or egg yolks. Chicken or [place] with skin. Dairy  Whole or 2% milk, cream, and half-and-half. Whole or full-fat cream cheese. Whole-fat or sweetened yogurt. Full-fat cheese. Nondairy creamers. Whipped toppings. Processed cheese and cheese spreads. Fats and oils  Butter. Stick margarine. Lard. Shortening. Ghee. Bacon fat. Tropical oils, such as coconut, palm kernel, or palm oil. Seasoning and other foods  Salted popcorn and pretzels. Onion salt, garlic salt, seasoned salt, table salt, and sea salt. Worcestershire sauce. Tartar sauce. Barbecue sauce. Teriyaki sauce. Soy sauce, including reduced-sodium. Steak sauce. Canned and packaged gravies. Fish sauce. Oyster sauce. Cocktail sauce. Horseradish that you find on the shelf. Ketchup. Mustard. Meat flavorings and tenderizers. Bouillon cubes. Hot sauce and Tabasco sauce. Premade or  packaged marinades. Premade or packaged taco seasonings. Relishes. Regular salad dressings. Where to find more information:  National Heart, Lung, and [place]: https://wilson-eaton.com/  American Heart Association: www.heart.org Summary  The DASH eating plan is a healthy eating plan that has been shown to reduce high blood pressure (hypertension). It may also reduce your risk for type 2 diabetes, heart disease, and stroke.  With the DASH eating plan, you should limit salt (sodium) intake to 2,300 mg a day. If you have hypertension, you may need to reduce your sodium intake to 1,500 mg a day.  When  on the DASH eating plan, aim to eat more fresh fruits and vegetables, whole grains, lean proteins, low-fat dairy, and heart-healthy fats.  Work with your health care provider or diet and nutrition specialist (dietitian) to adjust your eating plan to your individual calorie needs. This information is not intended to replace advice given to you by your health care provider. Make sure you discuss any questions you have with your health care provider. Document Released: 04/17/2011 Document Revised: 04/21/2016 Document Reviewed: 04/21/2016 Elsevier Interactive Patient Education  2017 Dana [name] Maintenance, Female Adopting a healthy lifestyle and getting preventive care can go a long way to promote health and wellness. Talk with your health care provider about what schedule of regular examinations is right for you. This is a good chance for you to check in with your provider about disease prevention and staying healthy. In between checkups, there are plenty of things you can do on your own. Experts have done a lot of research about which lifestyle changes and preventive measures are most likely to keep you healthy. Ask your health care provider for more information. Weight and diet Eat a healthy diet  Be sure to include plenty of vegetables, fruits, low-fat dairy products, and lean protein.  Do not eat a lot of foods high in solid fats, added sugars, or salt.  Get regular exercise. This is one of the most important things you can do for your health.  Most adults should exercise for at least 150 minutes each week. The exercise should increase your heart rate and make you sweat (moderate-intensity exercise).  Most adults should also do strengthening exercises at least twice a week. This is in addition to the moderate-intensity exercise. Maintain a healthy weight  Body mass index (BMI) is a measurement that can be used to identify possible weight problems. It estimates body fat based  on height and weight. Your health care provider can help determine your BMI and help you achieve or maintain a healthy weight.  For females 25 years of age and older:  A BMI below 18.5 is considered underweight.  A BMI of 18.5 to 24.9 is normal.  A BMI of 25 to 29.9 is considered overweight.  A BMI of 30 and above is considered obese. Watch levels of cholesterol and blood lipids  You should start having your blood tested for lipids and cholesterol at 60 years of age, then have this test every 5 years.  You may need to have your cholesterol levels checked more often if:  Your lipid or cholesterol levels are high.  You are older than 60 years of age.  You are at high risk for heart disease. Cancer screening Lung Cancer  Lung cancer screening is recommended for adults 52-1 years old who are at high risk for lung cancer because of a history of smoking.  A yearly low-dose CT scan of the lungs is  recommended for people who:  Currently smoke.  Have quit within the past 15 years.  Have at least a 30-pack-year history of smoking. A pack year is smoking an average of one pack of cigarettes a day for 1 year.  Yearly screening should continue until it has been 15 years since you quit.  Yearly screening should stop if you develop a health problem that would prevent you from having lung cancer treatment. Breast Cancer  Practice breast self-awareness. This means understanding how your breasts normally appear and feel.  It also means doing regular breast self-exams. Let your health care provider know about any changes, no matter how small.  If you are in your 20s or 30s, you should have a clinical breast exam (CBE) by a health care provider every 1-3 years as part of a regular health exam.  If you are 74 or older, have a CBE every year. Also consider having a breast X-ray (mammogram) every year.  If you have a family history of breast cancer, talk to your health care provider about  genetic screening.  If you are at high risk for breast cancer, talk to your health care provider about having an MRI and a mammogram every year.  Breast cancer gene (BRCA) assessment is recommended for women who have family members with BRCA-related cancers. BRCA-related cancers include:  Breast.  Ovarian.  Tubal.  Peritoneal cancers.  Results of the assessment will determine the need for genetic counseling and BRCA1 and BRCA2 testing. Cervical Cancer  Your health care provider may recommend that you be screened regularly for cancer of the pelvic organs (ovaries, uterus, and vagina). This screening involves a pelvic examination, including checking for microscopic changes to the surface of your cervix (Pap test). You may be encouraged to have this screening done every 3 years, beginning at age 55.  For women ages 47-65, health care providers may recommend pelvic exams and Pap testing every 3 years, or they may recommend the Pap and pelvic exam, combined with testing for human papilloma virus (HPV), every 5 years. Some types of HPV increase your risk of cervical cancer. Testing for HPV may also be done on women of any age with unclear Pap test results.  Other health care providers may not recommend any screening for nonpregnant women who are considered low risk for pelvic cancer and who do not have symptoms. Ask your health care provider if a screening pelvic exam is right for you.  If you have had past treatment for cervical cancer or a condition that could lead to cancer, you need Pap tests and screening for cancer for at least 20 years after your treatment. If Pap tests have been discontinued, your risk factors (such as having a new sexual partner) need to be reassessed to determine if screening should resume. Some women have medical problems that increase the chance of getting cervical cancer. In these cases, your health care provider may recommend more frequent screening and Pap  tests. Colorectal Cancer  This type of cancer can be detected and often prevented.  Routine colorectal cancer screening usually begins at 60 years of age and continues through 59 years of age.  Your health care provider may recommend screening at an earlier age if you have risk factors for colon cancer.  Your health care provider may also recommend using home test kits to check for hidden blood in the stool.  A small camera at the end of a tube can be used to examine your colon directly (  sigmoidoscopy or colonoscopy). This is done to check for the earliest forms of colorectal cancer.  Routine screening usually begins at age 51.  Direct examination of the colon should be repeated every 5-10 years through 60 years of age. However, you may need to be screened more often if early forms of precancerous polyps or small growths are found. Skin Cancer  Check your skin from head to toe regularly.  Tell your health care provider about any new moles or changes in moles, especially if there is a change in a mole's shape or color.  Also tell your health care provider if you have a mole that is larger than the size of a pencil eraser.  Always use sunscreen. Apply sunscreen liberally and repeatedly throughout the day.  Protect yourself by wearing long sleeves, pants, a wide-brimmed hat, and sunglasses whenever you are outside. Heart disease, diabetes, and high blood pressure  High blood pressure causes heart disease and increases the risk of stroke. High blood pressure is more likely to develop in:  People who have blood pressure in the high end of the normal range (130-139/85-89 mm Hg).  People who are overweight or obese.  People who are African American.  If you are 46-55 years of age, have your blood pressure checked every 3-5 years. If you are 24 years of age or older, have your blood pressure checked every year. You should have your blood pressure measured twice-once when you are at a  hospital or clinic, and once when you are not at a hospital or clinic. Record the average of the two measurements. To check your blood pressure when you are not at a hospital or clinic, you can use:  An automated blood pressure machine at a pharmacy.  A home blood pressure monitor.  If you are between 33 years and 76 years old, ask your health care provider if you should take aspirin to prevent strokes.  Have regular diabetes screenings. This involves taking a blood sample to check your fasting blood sugar level.  If you are at a normal weight and have a low risk for diabetes, have this test once every three years after 60 years of age.  If you are overweight and have a high risk for diabetes, consider being tested at a younger age or more often. Preventing infection Hepatitis B  If you have a higher risk for hepatitis B, you should be screened for this virus. You are considered at high risk for hepatitis B if:  You were born in a country where hepatitis B is common. Ask your health care provider which countries are considered high risk.  Your parents were born in a high-risk country, and you have not been immunized against hepatitis B (hepatitis B vaccine).  You have HIV or AIDS.  You use needles to inject street drugs.  You live with someone who has hepatitis B.  You have had sex with someone who has hepatitis B.  You get hemodialysis treatment.  You take certain medicines for conditions, including cancer, organ transplantation, and autoimmune conditions. Hepatitis C  Blood testing is recommended for:  Everyone born from [birth year] through 1964.  Anyone with known risk factors for hepatitis C. Sexually transmitted infections (STIs)  You should be screened for sexually transmitted infections (STIs) including gonorrhea and chlamydia if:  You are sexually active and are younger than 60 years of age.  You are older than 60 years of age and your health care provider tells you  that you  are at risk for this type of infection.  Your sexual activity has changed since you were last screened and you are at an increased risk for chlamydia or gonorrhea. Ask your health care provider if you are at risk.  If you do not have HIV, but are at risk, it may be recommended that you take a prescription medicine daily to prevent HIV infection. This is called pre-exposure prophylaxis (PrEP). You are considered at risk if:  You are sexually active and do not regularly use condoms or know the HIV status of your partner(s).  You take drugs by injection.  You are sexually active with a partner who has HIV. Talk with your health care provider about whether you are at high risk of being infected with HIV. If you choose to begin PrEP, you should first be tested for HIV. You should then be tested every 3 months for as long as you are taking PrEP. Pregnancy  If you are premenopausal and you may become pregnant, ask your health care provider about preconception counseling.  If you may become pregnant, take 400 to 800 micrograms (mcg) of folic acid every day.  If you want to prevent pregnancy, talk to your health care provider about birth control (contraception). Osteoporosis and menopause  Osteoporosis is a disease in which the bones lose minerals and strength with aging. This can result in serious bone fractures. Your risk for osteoporosis can be identified using a bone density scan.  If you are 42 years of age or older, or if you are at risk for osteoporosis and fractures, ask your health care provider if you should be screened.  Ask your health care provider whether you should take a calcium or vitamin D supplement to lower your risk for osteoporosis.  Menopause may have certain physical symptoms and risks.  Hormone replacement therapy may reduce some of these symptoms and risks. Talk to your health care provider about whether hormone replacement therapy is right for you. Follow  these instructions at home:  Schedule regular health, dental, and eye exams.  Stay current with your immunizations.  Do not use any tobacco products including cigarettes, chewing tobacco, or electronic cigarettes.  If you are pregnant, do not drink alcohol.  If you are breastfeeding, limit how much and how often you drink alcohol.  Limit alcohol intake to no more than 1 drink per day for nonpregnant women. One drink equals 12 ounces of beer, 5 ounces of wine, or 1 ounces of hard liquor.  Do not use street drugs.  Do not share needles.  Ask your health care provider for help if you need support or information about quitting drugs.  Tell your health care provider if you often feel depressed.  Tell your health care provider if you have ever been abused or do not feel safe at home. This information is not intended to replace advice given to you by your health care provider. Make sure you discuss any questions you have with your health care provider. Document Released: 11/11/2010 Document Revised: 10/04/2015 Document Reviewed: 01/30/2015 Elsevier Interactive Patient Education  2017 [employer].

## 2016-10-01 NOTE — Progress Notes (Signed)
Subjective:   Dana [name] is a 61 y.o. female who presents for an Initial Medicare Annual Wellness Visit.  Review of Systems    No ROS.  Medicare Wellness Visit.   Cardiac Risk Factors include: diabetes mellitus;dyslipidemia;hypertension;obesity (BMI >30kg/m2)   Sleep patterns: sleeps 7-8 hours, up to void x 2.  Home Safety/Smoke Alarms: Smoke detectors and security in place.  Living environment; residence and Firearm Safety: Lives with husband in 1 story home with ramp. Feels safe in home.  Seat Belt Safety/Bike Helmet: Wears seat belt.   Counseling:   Eye Exam-Last exam 08/2016, pt reports negative. Dr. Lysle Morales. [place] exam 03/2016, planning for dentures. Dr. Sheran Spine  Female:   Pap-05/12/2012       Mammo-02/2016, negative.       Dexa scan-05/12/2010. Declines testing at this time.       CCS-05/12/2012. Pt reports polyps, [facility]. Will repeat in 2019.       Objective:    Today's Vitals   10/01/16 1409  BP: 129/84  Pulse: 83  Resp: 17  Temp: 98.1 F (36.7 C)  TempSrc: Oral  SpO2: 98%  Weight: 181 lb 4 oz (82.2 kg)  Height: 5\' 1"  (1.549 m)   Body mass index is 34.25 kg/m.   Current Medications (verified) Outpatient Encounter Prescriptions as of 10/01/2016  Medication Sig  . aspirin 325 MG EC tablet Take 325 mg by mouth daily.  Marland Kitchen atorvastatin (LIPITOR) 20 MG tablet Take 1 tablet (20 mg total) by mouth at bedtime.  . Blood Glucose Monitoring Suppl (FREESTYLE LITE) DEVI Use to check blood sugar 3 times a day dx code E11.39  . escitalopram (LEXAPRO) 5 MG tablet Take 5 mg by mouth 3 (three) times daily.   Marland Kitchen FREESTYLE LITE test strip USE AS INSTRUCTED TO CHECK BLOOD SUGAR THREE TIMES DAILY  . gabapentin (NEURONTIN) 600 MG tablet TAKE TWO TABLETS BY MOUTH THREE TIMES DAILY  . insulin detemir (LEVEMIR) 100 UNIT/ML injection Inject 35 units in the skin daily at 10pm  . insulin regular (NOVOLIN R RELION) 100 units/mL  injection Take 6 units with breakfast, 8 units with lunch and 8 units with dinner (Patient taking differently: Inject 4-10 Units into the skin 3 (three) times daily before meals. Per meal size, small meals = less pumps to Vgo and large meal = more pumps to Vgo. 1 pump = 2 units.)  . INSULIN SYRINGE 1CC/29G 29G X 1/2" 1 ML MISC Use 1 per day.  . Lancets (FREESTYLE) lancets Use as instructed to check blood sugar 3 times per day dx code E11.39  . metFORMIN (GLUCOPHAGE) 500 MG tablet TAKE TWO TABLETS (1000MG ) BY MOUTH IN THE MORNING AND ONE TABLET (500MG ) IN THE EVENING  . OVER THE COUNTER MEDICATION Take 2 each by mouth daily. Centrum-Muli. Vitamin Gummy  . [DISCONTINUED] escitalopram (LEXAPRO) 10 MG tablet Take 30 mg by mouth daily.   . [DISCONTINUED] insulin detemir (LEVEMIR) 100 UNIT/ML injection Inject 35 Units into the skin at bedtime.   No facility-administered encounter medications on file as of 10/01/2016.     Allergies (verified) Lithium; Ativan [lorazepam]; Demerol [meperidine]; Oxycodone; Darvocet [propoxyphene n-acetaminophen]; Latex; and Propoxyphene   History: Past Medical History:  Diagnosis Date  . Bipolar 1 disorder ([place])   . Cellulitis and abscess of foot 03/08/2015  . Diabetes mellitus    INSULIN DEPENDENT  . Diabetic neuropathy ([place])   . Diabetic neuropathy ([place])   . 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  H/O hiatal hernia   . Hyperlipidemia   . Hypertension    past hx of  . Iron malabsorption 09/26/2015  . Numbness and tingling    Hx; of in B/LLE and B/LUE  . Other iron deficiency anemias 09/20/2015   Past Surgical History:  Procedure Laterality Date  . ABDOMINAL HYSTERECTOMY    . ADENOIDECTOMY     Hx: of  . AMPUTATION  10/24/2011   Procedure: AMPUTATION RAY;  Surgeon: Newt Minion, MD;  Location: [facility];  Service: Orthopedics;  Laterality: Right;  Right Foot 3rd Ray Amputation  . AMPUTATION Right 12/16/2012   Procedure: Right Foot Transmetatarsal  Amputation;  Surgeon: Newt Minion, MD;  Location: [facility];  Service: Orthopedics;  Laterality: Right;  . AMPUTATION Left 03/09/2015   Procedure: LEFT FOOT 1ST RAY AMPUTATION;  Surgeon: Newt Minion, MD;  Location: [facility];  Service: Orthopedics;  Laterality: Left;  . BLADDER SURGERY     x 2, tacked 1st time; mesh "eroded", had to be removed  . Bladder Tact   2002  . BREAST SURGERY Left    "knot removed"  . CARPAL TUNNEL RELEASE Left   . COLON SURGERY    . NASAL SEPTUM SURGERY  1976  . TONSILLECTOMY     age 85's   Family History  Problem Relation Age of Onset  . Diabetes Mother   . Mental illness Mother   . Bipolar disorder Mother   . Hypertension Father   . Diabetes Maternal Grandmother   . Heart disease Neg Hx    Social History   Occupational History  .      disabled   Social History Main Topics  . Smoking status: Never Smoker  . Smokeless tobacco: Never Used  . Alcohol use No  . Drug use: No  . Sexual activity: No    Tobacco Counseling Counseling given: Not Answered   Activities of Daily Living In your present state of health, do you have any difficulty performing the following activities: 10/01/2016 10/01/2016  Hearing? N N  Vision? N N  Difficulty concentrating or making decisions? Y N  Walking or climbing stairs? Y Y  Dressing or bathing? N N  Doing errands, shopping? N Y  Conservation officer, nature and eating ? N N  Using the Toilet? N N  In the past six months, have you accidently leaked urine? N N  Do you have problems with loss of bowel control? N N  Managing your Medications? N N  Managing your Finances? N N  Housekeeping or managing your Housekeeping? N N  Some recent data might be hidden    Immunizations and Health Maintenance Immunization History  Administered Date(s) Administered  . Influenza,inj,Quad PF,36+ Mos 02/21/2015, 02/05/2016  . Pneumococcal Polysaccharide-23 09/17/2015  . Tdap 04/12/2015   Health Maintenance Due  Topic Date Due  . FOOT EXAM   11/08/2015  . URINE MICROALBUMIN  11/08/2015    Patient Care Team: Delorse Limber as PCP - General (Physician Assistant) Elayne Snare, MD as Consulting Physician (Endocrinology) Valaria Good (Psychiatry) Avie Echevaria., MD as Referring Physician (Sports Medicine) Volanda Napoleon, MD as Consulting Physician (Oncology)  Indicate any recent Medical Services you may have received from other than [facility] providers in the past year (date may be approximate).     Assessment:   This is a routine wellness examination for Dana [name]. Physical assessment deferred to PCP.   Hearing/Vision screen Hearing Screening Comments: Able to hear conversational tones w/o difficulty. No  issues reported.   Vision Screening Comments: Cataract surgery   Dietary issues and exercise activities discussed: Current Exercise Habits: The patient does not participate in regular exercise at present (Maintains house, uses kettlebell), Exercise limited by: orthopedic condition(s)   Diet (meal preparation, eat out, water intake, caffeinated beverages, dairy products, fruits and vegetables): Drinks soda, occasional water.   Breakfast: skip Lunch: sandwich Dinner: meat, vegetables     Discussed heart healthy diet, not skipping meals and increasing water. Diabetic meal planning provided.   Goals    . Weight (lb) < 163 lb (73.9 kg)          Lose 10 % weight by increasing activity and making healthier food options.       Depression Screen PHQ 2/9 Scores 10/01/2016 10/01/2016 12/28/2015 11/08/2014  PHQ - 2 Score 0 0 0 0  PHQ- 9 Score - 0 3 -    Fall Risk Fall Risk  10/01/2016 10/01/2016 04/16/2016 02/14/2016 11/26/2015  Falls in the past year? No No No No No    Cognitive Function:       Ad8 score reviewed for issues:  Issues making decisions: no  Less interest in hobbies / activities: no  Repeats questions, stories (family complaining): no  Trouble using ordinary gadgets (microwave, computer, phone):  no  Forgets the month or year: no  Mismanaging finances: no  Remembering appts: no  Daily problems with thinking and/or memory: no Ad8 score is=0     Screening Tests Health Maintenance  Topic Date Due  . FOOT EXAM  11/08/2015  . URINE MICROALBUMIN  11/08/2015  . Hepatitis C Screening  02/09/2017 (Originally 1955-12-21)  . INFLUENZA VACCINE  12/10/2016  . HEMOGLOBIN A1C  02/12/2017  . OPHTHALMOLOGY EXAM  09/01/2017  . MAMMOGRAM  02/09/2018  . PNEUMOCOCCAL POLYSACCHARIDE VACCINE (2) 09/16/2020  . COLONOSCOPY  02/10/2023  . DTaP/Tdap/Td (2 - Td) 04/11/2025  . TETANUS/TDAP  04/11/2025  . HIV Screening  Completed      Plan:    Bring a copy of your advance directives to your next office visit.  Continue doing brain stimulating activities (puzzles, reading, adult coloring books, staying active) to keep memory sharp.   I have personally reviewed and noted the following in the patient's chart:   . Medical and social history . Use of alcohol, tobacco or illicit drugs  . Current medications and supplements . Functional ability and status . Nutritional status . Physical activity . Advanced directives . List of other physicians . Hospitalizations, surgeries, and ER visits in previous 12 months . Vitals . Screenings to include cognitive, depression, and falls . Referrals and appointments  In addition, I have reviewed and discussed with patient certain preventive protocols, quality metrics, and best practice recommendations. A written personalized care plan for preventive services as well as general preventive health recommendations were provided to patient.     Gerilyn Nestle, RN   10/01/2016   PCP Note: -Declines DEXA scan at this time

## 2016-10-02 NOTE — Progress Notes (Signed)
Patient aware.   Lab appointment scheduled 10/07/16 @ 2:30pm.

## 2016-10-03 DIAGNOSIS — H5203 Hypermetropia, bilateral: Secondary | ICD-10-CM | POA: Diagnosis not present

## 2016-10-04 NOTE — Assessment & Plan Note (Signed)
Patient about to undergo BKA or R extremity. Vitals are stable. EKG with negative T wave in III. Otherwise unchanged from prior tracings. Will obtain labs today. Surgical clearance pending lab results.

## 2016-10-04 NOTE — Assessment & Plan Note (Signed)
Followed by Endo, now with good control over A1C. Health maintenance parameters are up-to-date. Unfortunately her long-standing history of uncontrolled diabetes has caused significant damage. Patient with neuropathy, nephropathy and retinopathy. Also with EPO-deficiency anemia 2/2 CKD. Repeat labs today to ensure stable renal function. Patient to follow-up with specialists as scheduled.

## 2016-10-04 NOTE — Assessment & Plan Note (Signed)
Followed by Psychiatry. Patient doing well. Continue current regimen. Follow-up with specialist as scheduled.

## 2016-10-04 NOTE — Assessment & Plan Note (Signed)
Eye examination up-to-date. No acute concerns per patient.

## 2016-10-04 NOTE — Progress Notes (Signed)
RN MWV note reviewed.  Tayshun Gappa Cody, PA-C  

## 2016-10-04 NOTE — Assessment & Plan Note (Signed)
Severe. Is about to undergo R BKA due to history of osteomyelitis. Again reviewed appropriate care of lower extremities. Follow-up with orthopedics and podiatry as scheduled.

## 2016-10-04 NOTE — Assessment & Plan Note (Signed)
Followed by Hematology. Hgb has been stable. A1C controlled. Repeat labs today.

## 2016-10-04 NOTE — Assessment & Plan Note (Signed)
Taking medications as directed. Repeat fasting labs today.

## 2016-10-07 ENCOUNTER — Other Ambulatory Visit (INDEPENDENT_AMBULATORY_CARE_PROVIDER_SITE_OTHER): Payer: PPO

## 2016-10-07 DIAGNOSIS — E875 Hyperkalemia: Secondary | ICD-10-CM | POA: Diagnosis not present

## 2016-10-07 LAB — COMPREHENSIVE METABOLIC PANEL
ALBUMIN: 3.9 g/dL (ref 3.5–5.2)
ALK PHOS: 88 U/L (ref 39–117)
ALT: 14 U/L (ref 0–35)
AST: 13 U/L (ref 0–37)
BILIRUBIN TOTAL: 0.3 mg/dL (ref 0.2–1.2)
BUN: 34 mg/dL — AB (ref 6–23)
CO2: 27 mEq/L (ref 19–32)
Calcium: 9.5 mg/dL (ref 8.4–10.5)
Chloride: 104 mEq/L (ref 96–112)
Creatinine, Ser: 1.07 mg/dL (ref 0.40–1.20)
GFR: 55.65 mL/min — AB (ref >60.00)
Glucose, Bld: 172 mg/dL — ABNORMAL HIGH (ref 70–99)
POTASSIUM: 5.8 meq/L — AB (ref 3.5–5.1)
SODIUM: 139 meq/L (ref 135–145)
TOTAL PROTEIN: 6.9 g/dL (ref 6.0–8.3)

## 2016-10-07 LAB — LIPID PANEL
CHOLESTEROL: 152 mg/dL (ref 0–200)
HDL: 40.1 mg/dL (ref >39.00)
LDL Cholesterol: 77 mg/dL (ref 0–99)
NonHDL: 112.24
Total CHOL/HDL Ratio: 4
Triglycerides: 175 mg/dL — ABNORMAL HIGH (ref 0.0–149.0)
VLDL: 35 mg/dL (ref 0.0–40.0)

## 2016-10-08 ENCOUNTER — Other Ambulatory Visit: Payer: Self-pay | Admitting: Physician Assistant

## 2016-10-08 DIAGNOSIS — H3582 Retinal ischemia: Secondary | ICD-10-CM | POA: Diagnosis not present

## 2016-10-08 DIAGNOSIS — E113413 Type 2 diabetes mellitus with severe nonproliferative diabetic retinopathy with macular edema, bilateral: Secondary | ICD-10-CM | POA: Diagnosis not present

## 2016-10-08 DIAGNOSIS — Z961 Presence of intraocular lens: Secondary | ICD-10-CM | POA: Diagnosis not present

## 2016-10-08 DIAGNOSIS — E875 Hyperkalemia: Secondary | ICD-10-CM

## 2016-10-08 DIAGNOSIS — H35033 Hypertensive retinopathy, bilateral: Secondary | ICD-10-CM | POA: Diagnosis not present

## 2016-10-08 DIAGNOSIS — H4312 Vitreous hemorrhage, left eye: Secondary | ICD-10-CM | POA: Diagnosis not present

## 2016-10-08 DIAGNOSIS — H3563 Retinal hemorrhage, bilateral: Secondary | ICD-10-CM | POA: Diagnosis not present

## 2016-10-08 MED ORDER — ATORVASTATIN CALCIUM 20 MG PO TABS: 20.0000 mg | ORAL_TABLET | Freq: Every day | ORAL | 5 refills | Status: DC

## 2016-10-08 MED ORDER — FUROSEMIDE 20 MG PO TABS: 20.0000 mg | ORAL_TABLET | Freq: Every day | ORAL | 0 refills | Status: DC

## 2016-10-09 DIAGNOSIS — F3189 Other bipolar disorder: Secondary | ICD-10-CM | POA: Diagnosis not present

## 2016-10-13 ENCOUNTER — Other Ambulatory Visit (INDEPENDENT_AMBULATORY_CARE_PROVIDER_SITE_OTHER): Payer: PPO

## 2016-10-13 DIAGNOSIS — E875 Hyperkalemia: Secondary | ICD-10-CM | POA: Diagnosis not present

## 2016-10-14 LAB — BASIC METABOLIC PANEL
BUN: 32 mg/dL — ABNORMAL HIGH (ref 6–23)
CHLORIDE: 105 meq/L (ref 96–112)
CO2: 27 meq/L (ref 19–32)
CREATININE: 1.03 mg/dL (ref 0.40–1.20)
Calcium: 9.4 mg/dL (ref 8.4–10.5)
GFR: 58.15 mL/min — ABNORMAL LOW (ref >60.00)
Glucose, Bld: 131 mg/dL — ABNORMAL HIGH (ref 70–99)
Potassium: 5.5 mEq/L — ABNORMAL HIGH (ref 3.5–5.1)
Sodium: 139 mEq/L (ref 135–145)

## 2016-10-15 DIAGNOSIS — E113413 Type 2 diabetes mellitus with severe nonproliferative diabetic retinopathy with macular edema, bilateral: Secondary | ICD-10-CM | POA: Diagnosis not present

## 2016-10-16 ENCOUNTER — Encounter: Payer: Self-pay | Admitting: Emergency Medicine

## 2016-10-16 ENCOUNTER — Other Ambulatory Visit: Payer: Self-pay | Admitting: Emergency Medicine

## 2016-10-16 ENCOUNTER — Other Ambulatory Visit: Payer: Self-pay | Admitting: Physician Assistant

## 2016-10-16 DIAGNOSIS — E875 Hyperkalemia: Secondary | ICD-10-CM

## 2016-10-16 MED ORDER — FUROSEMIDE 20 MG PO TABS: 20.0000 mg | ORAL_TABLET | Freq: Every day | ORAL | 0 refills | Status: DC

## 2016-10-16 NOTE — Telephone Encounter (Signed)
Please send in Rx for Lasix to [facility] in [place]. Thanks

## 2016-10-20 DIAGNOSIS — M86261 Subacute osteomyelitis, right tibia and fibula: Secondary | ICD-10-CM | POA: Diagnosis not present

## 2016-10-22 ENCOUNTER — Other Ambulatory Visit (INDEPENDENT_AMBULATORY_CARE_PROVIDER_SITE_OTHER): Payer: PPO

## 2016-10-22 DIAGNOSIS — E875 Hyperkalemia: Secondary | ICD-10-CM | POA: Diagnosis not present

## 2016-10-22 LAB — BASIC METABOLIC PANEL
BUN: 31 mg/dL — AB (ref 6–23)
CALCIUM: 9.5 mg/dL (ref 8.4–10.5)
CHLORIDE: 103 meq/L (ref 96–112)
CO2: 29 meq/L (ref 19–32)
CREATININE: 0.99 mg/dL (ref 0.40–1.20)
GFR: 60.86 mL/min (ref >60.00)
Glucose, Bld: 101 mg/dL — ABNORMAL HIGH (ref 70–99)
Potassium: 5.2 mEq/L — ABNORMAL HIGH (ref 3.5–5.1)
Sodium: 142 mEq/L (ref 135–145)

## 2016-10-27 DIAGNOSIS — M86261 Subacute osteomyelitis, right tibia and fibula: Secondary | ICD-10-CM | POA: Diagnosis not present

## 2016-10-27 DIAGNOSIS — Z7982 Long term (current) use of aspirin: Secondary | ICD-10-CM | POA: Diagnosis not present

## 2016-10-27 DIAGNOSIS — M86271 Subacute osteomyelitis, right ankle and foot: Secondary | ICD-10-CM | POA: Diagnosis not present

## 2016-10-27 DIAGNOSIS — L97919 Non-pressure chronic ulcer of unspecified part of right lower leg with unspecified severity: Secondary | ICD-10-CM | POA: Diagnosis not present

## 2016-10-27 DIAGNOSIS — L97419 Non-pressure chronic ulcer of right heel and midfoot with unspecified severity: Secondary | ICD-10-CM | POA: Diagnosis not present

## 2016-10-27 DIAGNOSIS — E1169 Type 2 diabetes mellitus with other specified complication: Secondary | ICD-10-CM | POA: Diagnosis not present

## 2016-10-27 DIAGNOSIS — E11621 Type 2 diabetes mellitus with foot ulcer: Secondary | ICD-10-CM | POA: Diagnosis not present

## 2016-10-27 DIAGNOSIS — M869 Osteomyelitis, unspecified: Secondary | ICD-10-CM | POA: Diagnosis not present

## 2016-10-27 DIAGNOSIS — Z79899 Other long term (current) drug therapy: Secondary | ICD-10-CM | POA: Diagnosis not present

## 2016-10-29 DIAGNOSIS — Z4789 Encounter for other orthopedic aftercare: Secondary | ICD-10-CM | POA: Diagnosis not present

## 2016-10-30 DIAGNOSIS — Z89431 Acquired absence of right foot: Secondary | ICD-10-CM | POA: Diagnosis not present

## 2016-11-19 ENCOUNTER — Ambulatory Visit: Payer: PPO | Admitting: Endocrinology

## 2016-11-19 DIAGNOSIS — E113411 Type 2 diabetes mellitus with severe nonproliferative diabetic retinopathy with macular edema, right eye: Secondary | ICD-10-CM | POA: Diagnosis not present

## 2016-11-20 DIAGNOSIS — F3189 Other bipolar disorder: Secondary | ICD-10-CM | POA: Diagnosis not present

## 2016-11-25 ENCOUNTER — Ambulatory Visit: Payer: PPO | Admitting: Endocrinology

## 2016-11-26 DIAGNOSIS — M86271 Subacute osteomyelitis, right ankle and foot: Secondary | ICD-10-CM | POA: Diagnosis not present

## 2016-11-27 DIAGNOSIS — L97529 Non-pressure chronic ulcer of other part of left foot with unspecified severity: Secondary | ICD-10-CM | POA: Diagnosis not present

## 2016-11-27 DIAGNOSIS — Z89511 Acquired absence of right leg below knee: Secondary | ICD-10-CM | POA: Diagnosis not present

## 2016-11-27 DIAGNOSIS — Z4789 Encounter for other orthopedic aftercare: Secondary | ICD-10-CM | POA: Diagnosis not present

## 2016-12-01 DIAGNOSIS — L97425 Non-pressure chronic ulcer of left heel and midfoot with muscle involvement without evidence of necrosis: Secondary | ICD-10-CM | POA: Diagnosis not present

## 2016-12-01 DIAGNOSIS — Z89511 Acquired absence of right leg below knee: Secondary | ICD-10-CM | POA: Diagnosis not present

## 2016-12-01 DIAGNOSIS — E11621 Type 2 diabetes mellitus with foot ulcer: Secondary | ICD-10-CM | POA: Diagnosis not present

## 2016-12-01 DIAGNOSIS — E11610 Type 2 diabetes mellitus with diabetic neuropathic arthropathy: Secondary | ICD-10-CM | POA: Diagnosis not present

## 2016-12-01 DIAGNOSIS — S93125A Dislocation of metatarsophalangeal joint of left lesser toe(s), initial encounter: Secondary | ICD-10-CM | POA: Diagnosis not present

## 2016-12-01 DIAGNOSIS — E08621 Diabetes mellitus due to underlying condition with foot ulcer: Secondary | ICD-10-CM | POA: Diagnosis not present

## 2016-12-01 DIAGNOSIS — L089 Local infection of the skin and subcutaneous tissue, unspecified: Secondary | ICD-10-CM | POA: Diagnosis not present

## 2016-12-02 ENCOUNTER — Ambulatory Visit (INDEPENDENT_AMBULATORY_CARE_PROVIDER_SITE_OTHER): Payer: PPO | Admitting: Endocrinology

## 2016-12-02 ENCOUNTER — Encounter: Payer: Self-pay | Admitting: Endocrinology

## 2016-12-02 VITALS — BP 122/80 | HR 72 | Ht 61.0 in | Wt 162.6 lb

## 2016-12-02 DIAGNOSIS — S91329A Laceration with foreign body, unspecified foot, initial encounter: Secondary | ICD-10-CM | POA: Diagnosis not present

## 2016-12-02 DIAGNOSIS — E782 Mixed hyperlipidemia: Secondary | ICD-10-CM | POA: Diagnosis not present

## 2016-12-02 DIAGNOSIS — Z794 Long term (current) use of insulin: Secondary | ICD-10-CM | POA: Diagnosis not present

## 2016-12-02 DIAGNOSIS — E1165 Type 2 diabetes mellitus with hyperglycemia: Secondary | ICD-10-CM | POA: Diagnosis not present

## 2016-12-02 LAB — COMPREHENSIVE METABOLIC PANEL
ALBUMIN: 3.2 g/dL — AB (ref 3.5–5.2)
ALK PHOS: 115 U/L (ref 39–117)
ALT: 20 U/L (ref 0–35)
AST: 14 U/L (ref 0–37)
BILIRUBIN TOTAL: 0.2 mg/dL (ref 0.2–1.2)
BUN: 25 mg/dL — ABNORMAL HIGH (ref 6–23)
CALCIUM: 9.9 mg/dL (ref 8.4–10.5)
CO2: 30 mEq/L (ref 19–32)
Chloride: 103 mEq/L (ref 96–112)
Creatinine, Ser: 0.8 mg/dL (ref 0.40–1.20)
GFR: 77.8 mL/min (ref >60.00)
GLUCOSE: 117 mg/dL — AB (ref 70–99)
Potassium: 5.6 mEq/L — ABNORMAL HIGH (ref 3.5–5.1)
Sodium: 140 mEq/L (ref 135–145)
TOTAL PROTEIN: 7.3 g/dL (ref 6.0–8.3)

## 2016-12-02 LAB — HEMOGLOBIN A1C: Hgb A1c MFr Bld: 7.1 % — ABNORMAL HIGH (ref 4.6–6.5)

## 2016-12-02 NOTE — Patient Instructions (Addendum)
Check blood sugars on waking up  4/7 days   Also check blood sugars about 2 hours after a meal and do this after different meals by rotation  Recommended blood sugar levels on waking up is 90-130 and about 2 hours after meal is 130-160  Please bring your blood sugar monitor to each visit, thank you   

## 2016-12-02 NOTE — Progress Notes (Signed)
Patient ID: Dana [name], female   DOB: 1955-09-14, 61 y.o.   MRN: [id number]           Reason for Appointment: Follow-up for Type 2 Diabetes  Referring physician: Elyn Aquas  History of Present Illness:          Date of diagnosis of type 2 diabetes mellitus: 2006       Background history:   She thinks her blood sugar was 300-400 at the time of diagnosis and she was started on insulin soon after this. She thinks she has been on metformin only for the last 5 years Her A1c history is available since only about 2013 and this had been consistently over 10% She  had  poor control of her diabetes with pre-consultation A1c of 13%, was on Levemir only  Although she did better with the V-go pump in 2016 she stopped this because of increased cost  Recent history:    Her A1c is now 6.7 which is better than the last when she had in 9/17 of 7.8  INSULIN regimen is: 35 Levemir at bedtime, regular insulin before meals: 10-15-08  Current blood sugar patterns, daily management and problems identified:  She has had inconsistent blood sugar control and did not have her test strips also to monitor for about 10 days until last Friday  Her glucose levels are generally higher in the late afternoon and evening but inconsistent  She did have problems with osteomyelitis of the right leg and foot about 5 weeks ago  Although she is taking her regular insulin 15-30 minutes before meals she does not have consistent control of her postprandial readings  She is only taking 6 units at lunchtime but her blood sugars are relatively higher at suppertime  Not clear if this is related to her taking Levemir only at night  She has had only one low blood sugar after supper when she took her lunch and supper doses within 3-4 hours  FASTING blood sugars are mildly increased overall but inconsistent  She is still not adjusting her mealtime doses based on meal size of carbohydrate intake with some readings over  200                         Non-insulin hypoglycemic drugs the patient is taking are: Metformin 1g bid Side effects from medications have been: None  Compliance with the medical regimen: Good Hypoglycemia: As above    Glucose monitoring:  done 2-3 times a day         Glucometer:  FreeStyle .      Blood Glucose readings by time of day  Mean values apply above for all meters except median for One Touch  PRE-MEAL Fasting Lunch Dinner Bedtime Overall  Glucose range: 135   1 44-295  68-286    Mean/median:  50-1 92    190   168    Self-care:  Typical meal intake: Breakfast is irregular otherwise may have toast and an egg or sausage.  Lunch is a sandwich or cheeseburger, evening meal is meat and 2 vegetables.  For snacks he will have peanut butter crackers in pms Dinner 5-7 pm     She has regular drinks 0-1/2 per day, milk 1 cups daily          Dietician visit, most recent: Never               Exercise: none  Weight history: Stable over the  last 3-4 years, was 400 pounds about 5 years ago  Wt Readings from Last 3 Encounters:  12/02/16 162 lb 9.6 oz (73.8 kg)  10/01/16 181 lb 4 oz (82.2 kg)  09/18/16 179 lb 6.4 oz (81.4 kg)    Glycemic control:   Lab Results  Component Value Date   HGBA1C 6.7 08/13/2016   HGBA1C 7.8 (H) 01/18/2016   HGBA1C 7.5 09/20/2015   Lab Results  Component Value Date   MICROALBUR 0.8 10/01/2016   LDLCALC 77 10/01/2016   CREATININE 0.99 10/22/2016       Allergies as of 12/02/2016      Reactions   Keflex [cephalexin]    Tremors, rash, hard to breathe   Lithium Nausea And Vomiting, Other (See Comments)   Other reaction(s): Other (See Comments) Can not keep this medication down. It makes her terribly ill. Can not keep this medication down. It makes her terribly ill.   Ativan [lorazepam] Other (See Comments)   Other reaction(s): ANAPHYLAXIS Other reaction(s): Other (See Comments) Abnormal behavior Abnormal behavior   Demerol [meperidine]  Other (See Comments)   Other reaction(s): ANAPHYLAXIS Other reaction(s): Other (See Comments) Abnormal behavior Abnormal behavior   Oxycodone Other (See Comments)   Other reaction(s): OTHER Abnormal behavior   Darvocet [propoxyphene N-acetaminophen] Itching   Latex Itching   Other reaction(s): OTHER   Propoxyphene Itching      Medication List       Accurate as of 12/02/16  2:52 PM. Always use your most recent med list.          aspirin 325 MG EC tablet Take 325 mg by mouth daily.   atorvastatin 20 MG tablet Commonly known as:  LIPITOR Take 1 tablet (20 mg total) by mouth at bedtime.   escitalopram 5 MG tablet Commonly known as:  LEXAPRO Take 5 mg by mouth 3 (three) times daily.   freestyle lancets Use as instructed to check blood sugar 3 times per day dx code E11.39   FREESTYLE LITE Devi Use to check blood sugar 3 times a day dx code E11.39   FREESTYLE LITE test strip Generic drug:  glucose blood USE AS INSTRUCTED TO CHECK BLOOD SUGAR THREE TIMES DAILY   gabapentin 600 MG tablet Commonly known as:  NEURONTIN TAKE TWO TABLETS BY MOUTH THREE TIMES DAILY   insulin detemir 100 UNIT/ML injection Commonly known as:  LEVEMIR Inject 35 units in the skin daily at 10pm   insulin regular 100 units/mL injection Commonly known as:  NOVOLIN R RELION Take 6 units with breakfast, 8 units with lunch and 8 units with dinner   INSULIN SYRINGE 1CC/29G 29G X 1/2" 1 ML Misc Use 1 per day.   metFORMIN 500 MG tablet Commonly known as:  GLUCOPHAGE TAKE TWO TABLETS (1000MG ) BY MOUTH IN THE MORNING AND ONE TABLET (500MG ) IN THE EVENING   OVER THE COUNTER MEDICATION Take 2 each by mouth daily. Centrum-Muli. Vitamin Gummy       Allergies:  Allergies  Allergen Reactions  . Keflex [Cephalexin]     Tremors, rash, hard to breathe  . Lithium Nausea And Vomiting and Other (See Comments)    Other reaction(s): Other (See Comments) Can not keep this medication down. It makes her  terribly ill. Can not keep this medication down. It makes her terribly ill.  . Ativan [Lorazepam] Other (See Comments)    Other reaction(s): ANAPHYLAXIS Other reaction(s): Other (See Comments) Abnormal behavior Abnormal behavior  . Demerol [Meperidine] Other (See Comments)  Other reaction(s): ANAPHYLAXIS Other reaction(s): Other (See Comments) Abnormal behavior Abnormal behavior  . Oxycodone Other (See Comments)    Other reaction(s): OTHER Abnormal behavior  . Darvocet [Propoxyphene N-Acetaminophen] Itching  . Latex Itching    Other reaction(s): OTHER  . Propoxyphene Itching    Past Medical History:  Diagnosis Date  . Bipolar 1 disorder ([place])   . Cellulitis and abscess of foot 03/08/2015  . Diabetes mellitus    INSULIN DEPENDENT  . Diabetic neuropathy ([place])   . Diabetic neuropathy ([place])   . 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 H/O hiatal hernia   . Hyperlipidemia   . Hypertension    past hx of  . Iron malabsorption 09/26/2015  . Numbness and tingling    Hx; of in B/LLE and B/LUE  . Other iron deficiency anemias 09/20/2015    Past Surgical History:  Procedure Laterality Date  . ABDOMINAL HYSTERECTOMY    . ADENOIDECTOMY     Hx: of  . AMPUTATION  10/24/2011   Procedure: AMPUTATION RAY;  Surgeon: Newt Minion, MD;  Location: [facility];  Service: Orthopedics;  Laterality: Right;  Right Foot 3rd Ray Amputation  . AMPUTATION Right 12/16/2012   Procedure: Right Foot Transmetatarsal Amputation;  Surgeon: Newt Minion, MD;  Location: [facility];  Service: Orthopedics;  Laterality: Right;  . AMPUTATION Left 03/09/2015   Procedure: LEFT FOOT 1ST RAY AMPUTATION;  Surgeon: Newt Minion, MD;  Location: [facility];  Service: Orthopedics;  Laterality: Left;  . BLADDER SURGERY     x 2, tacked 1st time; mesh "eroded", had to be removed  . Bladder Tact   2002  . BREAST SURGERY Left    "knot removed"  . CARPAL TUNNEL RELEASE Left   . COLON SURGERY    . NASAL SEPTUM SURGERY  1976  .  TONSILLECTOMY     age 89's    Family History  Problem Relation Age of Onset  . Diabetes Mother   . Mental illness Mother   . Bipolar disorder Mother   . Hypertension Father   . Diabetes Maternal Grandmother   . Heart disease Neg Hx     Social History:  reports that she has never smoked. She has never used smokeless tobacco. She reports that she does not drink alcohol or use drugs.    Review of Systems   Has diabetic neuropathy with sensory loss Taking  600mg  gabapentin and analgesics from PCP  DIABETIC foot ulcers with history of amputations: [facility] with [facility] foot ulcers  Orthopedic Surgeon did an amputation of the right, has a BKA   Lipid history: On treatment with Lipitor and followed by PCP    Lab Results  Component Value Date   CHOL 152 10/01/2016   HDL 40.10 10/01/2016   LDLCALC 77 10/01/2016   TRIG 175.0 (H) 10/01/2016   CHOLHDL 4 10/01/2016             Physical Examination:  BP 122/80   Pulse 72   Ht 5\' 1"  (1.549 m)   Wt 162 lb 9.6 oz (73.8 kg)   SpO2 98%   BMI 30.72 kg/m        ASSESSMENT:  Diabetes type 2, uncontrolled     See history of present illness for detailed discussion of  current management, blood sugar patterns and problems identified  A1c needs to be checked today Previously blood sugars were better but she appears to have mostly high readings in  the evenings but variable She is not getting enough insulin most likely to cover her lunch and at times evening meal also Hypoglycemia has occurred either from taking an extra dose of regular insulin at bedtime or overestimating her dose at suppertime  HYPERLIPIDEMIA: Better controlled recently She will continue Lipitor  Recommendations: Since she has not had much glucose monitoring the last 2 weeks will not make any significant changes in her insulin For now discussed that if her sugars are mostly higher at suppertime she will increase lunchtime dose to  8 units She can adjust her suppertime dose +/-2 units based on meal size She will also need to consider taking Levemir since blood sugars maybe higher from inadequate duration of action A1c to be checked  Patient Instructions  Check blood sugars on waking up  4/7 days  Also check blood sugars about 2 hours after a meal and do this after different meals by rotation  Recommended blood sugar levels on waking up is 90-130 and about 2 hours after meal is 130-160  Please bring your blood sugar monitor to each visit, thank you      Counseling time on subjects discussed in assessment and plan sections is over 50% of today's 25 minute visit    Addysyn Fern 12/02/2016, 2:52 PM   Note: This office note was prepared with Dragon voice recognition system technology. Any transcriptional errors that result from this process are unintentional.

## 2016-12-05 ENCOUNTER — Other Ambulatory Visit: Payer: Self-pay | Admitting: Endocrinology

## 2016-12-05 DIAGNOSIS — E11319 Type 2 diabetes mellitus with unspecified diabetic retinopathy without macular edema: Secondary | ICD-10-CM

## 2016-12-05 DIAGNOSIS — Z794 Long term (current) use of insulin: Principal | ICD-10-CM

## 2016-12-05 DIAGNOSIS — E1165 Type 2 diabetes mellitus with hyperglycemia: Principal | ICD-10-CM

## 2016-12-05 DIAGNOSIS — IMO0002 Reserved for concepts with insufficient information to code with codable children: Secondary | ICD-10-CM

## 2016-12-08 DIAGNOSIS — F419 Anxiety disorder, unspecified: Secondary | ICD-10-CM | POA: Diagnosis not present

## 2016-12-08 DIAGNOSIS — Z89412 Acquired absence of left great toe: Secondary | ICD-10-CM | POA: Diagnosis not present

## 2016-12-08 DIAGNOSIS — R0602 Shortness of breath: Secondary | ICD-10-CM | POA: Diagnosis not present

## 2016-12-08 DIAGNOSIS — D649 Anemia, unspecified: Secondary | ICD-10-CM | POA: Diagnosis not present

## 2016-12-08 DIAGNOSIS — A498 Other bacterial infections of unspecified site: Secondary | ICD-10-CM | POA: Diagnosis not present

## 2016-12-08 DIAGNOSIS — E11621 Type 2 diabetes mellitus with foot ulcer: Secondary | ICD-10-CM | POA: Diagnosis not present

## 2016-12-08 DIAGNOSIS — F319 Bipolar disorder, unspecified: Secondary | ICD-10-CM | POA: Diagnosis not present

## 2016-12-08 DIAGNOSIS — Z89421 Acquired absence of other right toe(s): Secondary | ICD-10-CM | POA: Diagnosis not present

## 2016-12-08 DIAGNOSIS — E669 Obesity, unspecified: Secondary | ICD-10-CM | POA: Diagnosis not present

## 2016-12-08 DIAGNOSIS — E119 Type 2 diabetes mellitus without complications: Secondary | ICD-10-CM | POA: Diagnosis not present

## 2016-12-08 DIAGNOSIS — E1165 Type 2 diabetes mellitus with hyperglycemia: Secondary | ICD-10-CM | POA: Diagnosis not present

## 2016-12-08 DIAGNOSIS — L97421 Non-pressure chronic ulcer of left heel and midfoot limited to breakdown of skin: Secondary | ICD-10-CM | POA: Diagnosis not present

## 2016-12-08 DIAGNOSIS — Z885 Allergy status to narcotic agent status: Secondary | ICD-10-CM | POA: Diagnosis not present

## 2016-12-08 DIAGNOSIS — R079 Chest pain, unspecified: Secondary | ICD-10-CM | POA: Diagnosis not present

## 2016-12-08 DIAGNOSIS — Z794 Long term (current) use of insulin: Secondary | ICD-10-CM | POA: Diagnosis not present

## 2016-12-08 DIAGNOSIS — R072 Precordial pain: Secondary | ICD-10-CM | POA: Diagnosis not present

## 2016-12-08 DIAGNOSIS — Z79899 Other long term (current) drug therapy: Secondary | ICD-10-CM | POA: Diagnosis not present

## 2016-12-08 DIAGNOSIS — Z6833 Body mass index (BMI) 33.0-33.9, adult: Secondary | ICD-10-CM | POA: Diagnosis not present

## 2016-12-08 DIAGNOSIS — A4902 Methicillin resistant Staphylococcus aureus infection, unspecified site: Secondary | ICD-10-CM | POA: Diagnosis not present

## 2016-12-08 DIAGNOSIS — I10 Essential (primary) hypertension: Secondary | ICD-10-CM | POA: Diagnosis not present

## 2016-12-09 ENCOUNTER — Telehealth: Payer: Self-pay

## 2016-12-09 NOTE — Telephone Encounter (Signed)
LM requesting call back to schedule ER follow up. 

## 2016-12-10 NOTE — Telephone Encounter (Signed)
LMOVM requesting call back to schedule ER follow up appointment.

## 2016-12-12 NOTE — Telephone Encounter (Signed)
LM requesting call back to schedule ER follow up appointment. Will send MyChart message.

## 2016-12-15 ENCOUNTER — Ambulatory Visit: Payer: PPO | Admitting: Physician Assistant

## 2016-12-15 DIAGNOSIS — L97425 Non-pressure chronic ulcer of left heel and midfoot with muscle involvement without evidence of necrosis: Secondary | ICD-10-CM | POA: Diagnosis not present

## 2016-12-15 DIAGNOSIS — Z89511 Acquired absence of right leg below knee: Secondary | ICD-10-CM | POA: Diagnosis not present

## 2016-12-15 DIAGNOSIS — Z7982 Long term (current) use of aspirin: Secondary | ICD-10-CM | POA: Diagnosis not present

## 2016-12-15 DIAGNOSIS — E1142 Type 2 diabetes mellitus with diabetic polyneuropathy: Secondary | ICD-10-CM | POA: Diagnosis not present

## 2016-12-15 DIAGNOSIS — E113412 Type 2 diabetes mellitus with severe nonproliferative diabetic retinopathy with macular edema, left eye: Secondary | ICD-10-CM | POA: Diagnosis not present

## 2016-12-15 DIAGNOSIS — E11610 Type 2 diabetes mellitus with diabetic neuropathic arthropathy: Secondary | ICD-10-CM | POA: Diagnosis not present

## 2016-12-15 DIAGNOSIS — E08621 Diabetes mellitus due to underlying condition with foot ulcer: Secondary | ICD-10-CM | POA: Diagnosis not present

## 2016-12-15 DIAGNOSIS — Z794 Long term (current) use of insulin: Secondary | ICD-10-CM | POA: Diagnosis not present

## 2016-12-15 DIAGNOSIS — E11621 Type 2 diabetes mellitus with foot ulcer: Secondary | ICD-10-CM | POA: Diagnosis not present

## 2016-12-16 ENCOUNTER — Ambulatory Visit (INDEPENDENT_AMBULATORY_CARE_PROVIDER_SITE_OTHER): Payer: PPO | Admitting: Physician Assistant

## 2016-12-16 ENCOUNTER — Encounter: Payer: Self-pay | Admitting: Physician Assistant

## 2016-12-16 ENCOUNTER — Telehealth: Payer: Self-pay

## 2016-12-16 VITALS — BP 112/70 | HR 79 | Temp 98.7°F | Resp 14 | Ht 64.0 in | Wt 163.0 lb

## 2016-12-16 DIAGNOSIS — L03116 Cellulitis of left lower limb: Secondary | ICD-10-CM

## 2016-12-16 DIAGNOSIS — R0789 Other chest pain: Secondary | ICD-10-CM | POA: Diagnosis not present

## 2016-12-16 DIAGNOSIS — K219 Gastro-esophageal reflux disease without esophagitis: Secondary | ICD-10-CM

## 2016-12-16 MED ORDER — PANTOPRAZOLE SODIUM 40 MG PO TBEC: 40.0000 mg | DELAYED_RELEASE_TABLET | Freq: Every day | ORAL | 3 refills | Status: DC

## 2016-12-16 NOTE — Progress Notes (Signed)
Patient presents to clinic today for ER follow-up. Patient presented to [facility] ER on 12/08/16 with complaints to retrosternal chest pain. Workup included labs with negative cyclic troponin, EKG and CTA negative for PE. Was admitted for observation for a NM stress test giving risk for CAD and ACS. NM stress test performed and noted to be negative. Symptoms resolved during stay without recurrence. Patient also noted to have infected wound of L lower extremity. X-rays obtained and ruled out osteomyelitis. Patient was started on ABX. Patient was discharged home with close PCP follow-up. Also scheduled for close follow-up with wound care center.  Since discharge, patient endorses doing very well overall. Notes no recurrence of chest pain. Has noted some indigestion with reflux lately. Does note intermittent globus sensation. Patient has history of GERD with hiatal hernia. Said these are the symptoms she was having prior to the onset of the chest pain. Notes increase in spicy and fatty foods recently. Symptoms worse at night and with recumbent positioning.  Patient notes having follow-up earlier this week with the wound center. Was told everything looked great. Is finishing a course of Augmentin. Has another follow-up this Thursday.  Denies fever, chills, malaise or fatigue.   Past Medical History:  Diagnosis Date  . Bipolar 1 disorder ([place])   . Cellulitis and abscess of foot 03/08/2015  . Diabetes mellitus    INSULIN DEPENDENT  . Diabetic neuropathy ([place])   . Diabetic neuropathy ([place])   . 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 H/O hiatal hernia   . Hyperlipidemia   . Hypertension    past hx of  . Iron malabsorption 09/26/2015  . Numbness and tingling    Hx; of in B/LLE and B/LUE  . Other iron deficiency anemias 09/20/2015    Current Outpatient Prescriptions on File Prior to Visit  Medication Sig Dispense Refill  . aspirin 325 MG EC tablet Take 325 mg by mouth daily.    Marland Kitchen  atorvastatin (LIPITOR) 20 MG tablet Take 1 tablet (20 mg total) by mouth at bedtime. 30 tablet 5  . Blood Glucose Monitoring Suppl (FREESTYLE LITE) DEVI Use to check blood sugar 3 times a day dx code E11.39 1 each 0  . escitalopram (LEXAPRO) 5 MG tablet Take 5 mg by mouth 3 (three) times daily.     Marland Kitchen FREESTYLE LITE test strip USE AS INSTRUCTED TO CHECK BLOOD SUGAR THREE TIMES DAILY 100 each 3  . gabapentin (NEURONTIN) 600 MG tablet TAKE TWO TABLETS BY MOUTH THREE TIMES DAILY 180 tablet 3  . insulin detemir (LEVEMIR) 100 UNIT/ML injection Inject 35 units in the skin daily at 10pm 20 mL 4  . insulin regular (NOVOLIN R RELION) 100 units/mL injection Take 6 units with breakfast, 8 units with lunch and 8 units with dinner (Patient taking differently: Inject 4-10 Units into the skin 3 (three) times daily before meals. Per meal size, small meals = less pumps to Vgo and large meal = more pumps to Vgo. 1 pump = 2 units.) 10 mL 3  . INSULIN SYRINGE 1CC/29G 29G X 1/2" 1 ML MISC Use 1 per day. 100 each 2  . Lancets (FREESTYLE) lancets Use as instructed to check blood sugar 3 times per day dx code E11.39 100 each 3  . metFORMIN (GLUCOPHAGE) 500 MG tablet TAKE TWO TABLETS BY MOUTH ONCE DAILY IN THE MORNING AND ONE ONCE DAILY IN THE EVENING 90 tablet 5  . OVER THE COUNTER MEDICATION Take 2 each by mouth daily. Centrum-Muli. Vitamin  Gummy     No current facility-administered medications on file prior to visit.     Allergies  Allergen Reactions  . Keflex [Cephalexin]     Tremors, rash, hard to breathe  . Lithium Nausea And Vomiting and Other (See Comments)    Other reaction(s): Other (See Comments) Can not keep this medication down. It makes her terribly ill. Can not keep this medication down. It makes her terribly ill.  . Ativan [Lorazepam] Other (See Comments)    Other reaction(s): ANAPHYLAXIS Other reaction(s): Other (See Comments) Abnormal behavior Abnormal behavior  . Demerol [Meperidine] Other (See  Comments)    Abnormal behavior (sees things) Other reaction(s): ANAPHYLAXIS Other reaction(s): Other (See Comments) Abnormal behavior Abnormal behavior  . Oxycodone Other (See Comments)    Other reaction(s): OTHER Abnormal behavior  . Doxycycline Other (See Comments)    Severe muscle tremor  . Darvocet [Propoxyphene N-Acetaminophen] Itching  . Latex Itching    Other reaction(s): OTHER  . Propoxyphene Itching    Family History  Problem Relation Age of Onset  . Diabetes Mother   . Mental illness Mother   . Bipolar disorder Mother   . Hypertension Father   . Diabetes Maternal Grandmother   . Heart disease Neg Hx     Social History   Social History  . Marital status: Married    Spouse name: N/A  . Number of children: 2  . Years of education: 14   Occupational History  .      disabled   Social History Main Topics  . Smoking status: Never Smoker  . Smokeless tobacco: Never Used  . Alcohol use No  . Drug use: No  . Sexual activity: No   Other Topics Concern  . None   Social History Narrative   Lives with husband in home   Caffeine use - Coke 2 or 3 16 oz daily   Review of Systems - See HPI.  All other ROS are negative.  BP 112/70   Pulse 79   Temp 98.7 F (37.1 C) (Oral)   Resp 14   Ht '5\' 4"'  (1.626 m)   Wt 163 lb (73.9 kg)   SpO2 96%   BMI 27.98 kg/m   Physical Exam  Constitutional: She is oriented to person, place, and time and well-developed, well-nourished, and in no distress.  HENT:  Head: Normocephalic and atraumatic.  Eyes: Conjunctivae are normal.  Neck: Neck supple.  Cardiovascular: Normal rate, regular rhythm, normal heart sounds and intact distal pulses.   Pulmonary/Chest: Effort normal and breath sounds normal. No respiratory distress. She has no wheezes. She has no rales. She exhibits no tenderness.  Abdominal: Soft. Bowel sounds are normal. She exhibits no distension. There is no tenderness.  Neurological: She is alert and oriented to  person, place, and time.  Skin: Skin is warm and dry. No rash noted.  Psychiatric: Affect normal.  Vitals reviewed.   Recent Results (from the past 2160 hour(s))  CBC with Differential [facility])     Status: Abnormal   Collection Time: 09/18/16 10:52 AM  Result Value Ref Range   WBC 6.2 3.9 - 10.0 10e3/uL   RBC 3.49 (L) 3.70 - 5.32 10e6/uL   HGB 11.3 (L) 11.6 - 15.9 g/dL   HCT 32.8 (L) 34.8 - 46.6 %   MCV 94 81 - 101 fL   MCH 32.4 26.0 - 34.0 pg   MCHC 34.5 32.0 - 36.0 g/dL   RDW 12.7 11.1 - 15.7 %  Platelets 317 145 - 400 10e3/uL   NEUT# 2.8 1.5 - 6.5 10e3/uL   LYMPH# 2.7 0.9 - 3.3 10e3/uL   MONO# 0.4 0.1 - 0.9 10e3/uL   Eosinophils Absolute 0.3 0.0 - 0.5 10e3/uL   BASO# 0.0 0.0 - 0.2 10e3/uL   NEUT% 44.7 39.6 - 80.0 %   LYMPH% 43.0 14.0 - 48.0 %   MONO% 6.8 0.0 - 13.0 %   EOS% 5.0 0.0 - 7.0 %   BASO% 0.5 0.0 - 2.0 %  CMP STAT ([facility] only)     Status: Abnormal   Collection Time: 09/18/16 10:52 AM  Result Value Ref Range   Sodium 142 128 - 145 mEq/L   Potassium 4.5 3.3 - 4.7 mEq/L   Chloride 104 98 - 108 mEq/L   CO2 29 18 - 33 mEq/L   Glucose, Bld 173 (H) 73 - 118 mg/dL   BUN, Bld 21 7 - 22 mg/dL   Creat 0.9 0.6 - 1.2 mg/dl   Total Bilirubin 0.50 0.20 - 1.60 mg/dl   Alkaline Phosphatase 97 (H) 26 - 84 U/L   AST 23 11 - 38 U/L   ALT(SGPT) 24 10 - 47 U/L   Total Protein 7.0 6.4 - 8.1 g/dL   Albumin 3.1 (L) 3.3 - 5.5 g/dL   Calcium 9.5 8.0 - 10.3 mg/dL  Reticulocytes     Status: None   Collection Time: 09/18/16 10:52 AM  Result Value Ref Range   Reticulocyte Count 2.3 0.6 - 2.6 %  Ferritin     Status: Abnormal   Collection Time: 09/18/16 10:52 AM  Result Value Ref Range   Ferritin 609 (H) 9 - 269 ng/ml  Iron and TIBC     Status: Abnormal   Collection Time: 09/18/16 10:52 AM  Result Value Ref Range   Iron 90 41 - 142 ug/dL   TIBC 216 (L) 236 - 444 ug/dL   UIBC 127 120 - 384 ug/dL   %SAT 41 21 - 57 %  TECHNOLOGIST REVIEW [facility]      Status: None   Collection Time: 09/18/16 10:52 AM  Result Value Ref Range   Tech Review Rare myelocyte   CBC     Status: Abnormal   Collection Time: 10/01/16  3:31 PM  Result Value Ref Range   WBC 9.5 4.0 - 10.5 K/uL   RBC 3.51 (L) 3.87 - 5.11 Mil/uL   Platelets 380.0 150.0 - 400.0 K/uL   Hemoglobin 11.2 (L) 12.0 - 15.0 g/dL   HCT 32.9 (L) 36.0 - 46.0 %   MCV 93.7 78.0 - 100.0 fl   MCHC 34.2 30.0 - 36.0 g/dL   RDW 13.5 11.5 - 15.5 %  Comp Met (CMET)     Status: Abnormal   Collection Time: 10/01/16  3:31 PM  Result Value Ref Range   Sodium 141 135 - 145 mEq/L   Potassium 6.0 (H) 3.5 - 5.1 mEq/L   Chloride 105 96 - 112 mEq/L   CO2 30 19 - 32 mEq/L   Glucose, Bld 60 (L) 70 - 99 mg/dL   BUN 35 (H) 6 - 23 mg/dL   Creatinine, Ser 0.94 0.40 - 1.20 mg/dL   Total Bilirubin 0.3 0.2 - 1.2 mg/dL   Alkaline Phosphatase 97 39 - 117 U/L   AST 13 0 - 37 U/L   ALT 13 0 - 35 U/L   Total Protein 7.1 6.0 - 8.3 g/dL   Albumin 4.1 3.5 - 5.2 g/dL  Calcium 10.0 8.4 - 10.5 mg/dL   GFR 64.63 >60.00 mL/min  TSH     Status: None   Collection Time: 10/01/16  3:31 PM  Result Value Ref Range   TSH 1.22 0.35 - 4.50 uIU/mL  Urinalysis, Routine w reflex microscopic     Status: Abnormal   Collection Time: 10/01/16  3:31 PM  Result Value Ref Range   Color, Urine YELLOW Yellow;Lt. Yellow   APPearance CLEAR Clear   Specific Gravity, Urine >=1.030 (A) 1.000 - 1.030   pH 5.5 5.0 - 8.0   Total Protein, Urine 100 (A) Negative   Urine Glucose NEGATIVE Negative   Ketones, ur TRACE (A) Negative   Bilirubin Urine NEGATIVE Negative   Hgb urine dipstick TRACE-INTACT (A) Negative   Urobilinogen, UA 0.2 0.0 - 1.0   Leukocytes, UA NEGATIVE Negative   Nitrite NEGATIVE Negative   WBC, UA 0-2/hpf 0-2/hpf   RBC / HPF 0-2/hpf 0-2/hpf   Mucus, UA Presence of (A) None   Squamous Epithelial / LPF Rare(0-4/hpf) Rare(0-4/hpf)  Urine Microalbumin w/creat. ratio     Status: None   Collection Time: 10/01/16  3:31 PM    Result Value Ref Range   Microalb, Ur 0.8 0.0 - 1.9 mg/dL   Creatinine,U 201.0 mg/dL   Microalb Creat Ratio 0.4 0.0 - 30.0 mg/g  Lipid panel     Status: Abnormal   Collection Time: 10/01/16  4:00 PM  Result Value Ref Range   Cholesterol 152 0 - 200 mg/dL    Comment: ATP III Classification       Desirable:  < 200 mg/dL               Borderline High:  200 - 239 mg/dL          High:  > = 240 mg/dL   Triglycerides 175.0 (H) 0.0 - 149.0 mg/dL    Comment: Normal:  <150 mg/dLBorderline High:  150 - 199 mg/dL   HDL 40.10 >39.00 mg/dL   VLDL 35.0 0.0 - 40.0 mg/dL   LDL Cholesterol 77 0 - 99 mg/dL   Total CHOL/HDL Ratio 4     Comment:                Men          Women1/2 Average Risk     3.4          3.3Average Risk          5.0          4.42X Average Risk          9.6          7.13X Average Risk          15.0          11.0                       NonHDL 112.24     Comment: NOTE:  Non-HDL goal should be 30 mg/dL higher than patient's LDL goal (i.e. LDL goal of < 70 mg/dL, would have non-HDL goal of < 100 mg/dL)  Comp Met (CMET)     Status: Abnormal   Collection Time: 10/07/16  2:13 PM  Result Value Ref Range   Sodium 139 135 - 145 mEq/L   Potassium 5.8 (H) 3.5 - 5.1 mEq/L   Chloride 104 96 - 112 mEq/L   CO2 27 19 - 32 mEq/L   Glucose, Bld 172 (H) 70 - 99 mg/dL  BUN 34 (H) 6 - 23 mg/dL   Creatinine, Ser 1.07 0.40 - 1.20 mg/dL   Total Bilirubin 0.3 0.2 - 1.2 mg/dL   Alkaline Phosphatase 88 39 - 117 U/L   AST 13 0 - 37 U/L   ALT 14 0 - 35 U/L   Total Protein 6.9 6.0 - 8.3 g/dL   Albumin 3.9 3.5 - 5.2 g/dL   Calcium 9.5 8.4 - 10.5 mg/dL   GFR 55.65 (L) >60.00 mL/min  Basic metabolic panel     Status: Abnormal   Collection Time: 10/13/16  4:31 PM  Result Value Ref Range   Sodium 139 135 - 145 mEq/L   Potassium 5.5 (H) 3.5 - 5.1 mEq/L   Chloride 105 96 - 112 mEq/L   CO2 27 19 - 32 mEq/L   Glucose, Bld 131 (H) 70 - 99 mg/dL   BUN 32 (H) 6 - 23 mg/dL   Creatinine, Ser 1.03 0.40 - 1.20  mg/dL   Calcium 9.4 8.4 - 10.5 mg/dL   GFR 58.15 (L) >60.00 mL/min  Basic metabolic panel     Status: Abnormal   Collection Time: 10/22/16  1:36 PM  Result Value Ref Range   Sodium 142 135 - 145 mEq/L   Potassium 5.2 (H) 3.5 - 5.1 mEq/L   Chloride 103 96 - 112 mEq/L   CO2 29 19 - 32 mEq/L   Glucose, Bld 101 (H) 70 - 99 mg/dL   BUN 31 (H) 6 - 23 mg/dL   Creatinine, Ser 0.99 0.40 - 1.20 mg/dL   Calcium 9.5 8.4 - 10.5 mg/dL   GFR 60.86 >60.00 mL/min  Comprehensive metabolic panel     Status: Abnormal   Collection Time: 12/02/16 10:57 AM  Result Value Ref Range   Sodium 140 135 - 145 mEq/L   Potassium 5.6 (H) 3.5 - 5.1 mEq/L   Chloride 103 96 - 112 mEq/L   CO2 30 19 - 32 mEq/L   Glucose, Bld 117 (H) 70 - 99 mg/dL   BUN 25 (H) 6 - 23 mg/dL   Creatinine, Ser 0.80 0.40 - 1.20 mg/dL   Total Bilirubin 0.2 0.2 - 1.2 mg/dL   Alkaline Phosphatase 115 39 - 117 U/L   AST 14 0 - 37 U/L   ALT 20 0 - 35 U/L   Total Protein 7.3 6.0 - 8.3 g/dL   Albumin 3.2 (L) 3.5 - 5.2 g/dL   Calcium 9.9 8.4 - 10.5 mg/dL   GFR 77.80 >60.00 mL/min  Hemoglobin A1c     Status: Abnormal   Collection Time: 12/02/16 10:57 AM  Result Value Ref Range   Hgb A1c MFr Bld 7.1 (H) 4.6 - 6.5 %    Comment: Glycemic Control Guidelines for People with Diabetes:Non Diabetic:  <6%Goal of Therapy: <7%Additional Action Suggested:  >8%    Assessment/Plan: 1. Gastroesophageal reflux disease without esophagitis Start GERD diet and daily Protonix. Supportive measures reviewed. Follow-up scheduled. - pantoprazole (PROTONIX) 40 MG tablet; Take 1 tablet (40 mg total) by mouth daily.  Dispense: 30 tablet; Refill: 3  2. Atypical chest pain Extensive negative cardiopulmonary workup. On further assessment in office it seems that this pain is likely secondary to #1, which is being treated. Alarm signs/symptoms reviewed with patient. Follow-up scheduled.   3. Cellulitis of left lower extremity Patient followed by [place] with next  appt in 2 days. Is doing very well. Continue care as directed by specialist.    [name], PA-C

## 2016-12-16 NOTE — Patient Instructions (Signed)
Please stay well-hydrated and get plenty of rest. Make sure to finish the entire course of the antibiotic and follow-up with the wound care center as directed.   Limit late-night eating and spicy foods. Start the diet below. Take the Protonix daily as directed. Follow-up with me in 2 weeks. Return sooner if needed.

## 2016-12-16 NOTE — Progress Notes (Signed)
Pre visit review using our clinic review tool, if applicable. No additional management support is needed unless otherwise documented below in the visit note. 

## 2016-12-18 ENCOUNTER — Ambulatory Visit (HOSPITAL_BASED_OUTPATIENT_CLINIC_OR_DEPARTMENT_OTHER): Payer: PPO | Admitting: Hematology & Oncology

## 2016-12-18 ENCOUNTER — Other Ambulatory Visit (HOSPITAL_BASED_OUTPATIENT_CLINIC_OR_DEPARTMENT_OTHER): Payer: PPO

## 2016-12-18 VITALS — BP 125/75 | HR 71 | Temp 98.1°F | Resp 18 | Wt 180.0 lb

## 2016-12-18 DIAGNOSIS — D631 Anemia in chronic kidney disease: Secondary | ICD-10-CM

## 2016-12-18 DIAGNOSIS — E1169 Type 2 diabetes mellitus with other specified complication: Secondary | ICD-10-CM

## 2016-12-18 DIAGNOSIS — D638 Anemia in other chronic diseases classified elsewhere: Secondary | ICD-10-CM

## 2016-12-18 DIAGNOSIS — N189 Chronic kidney disease, unspecified: Secondary | ICD-10-CM

## 2016-12-18 DIAGNOSIS — D508 Other iron deficiency anemias: Secondary | ICD-10-CM

## 2016-12-18 DIAGNOSIS — K909 Intestinal malabsorption, unspecified: Secondary | ICD-10-CM

## 2016-12-18 LAB — COMPREHENSIVE METABOLIC PANEL
ALBUMIN: 2.8 g/dL — AB (ref 3.5–5.0)
ALK PHOS: 213 U/L — AB (ref 40–150)
ALT: 19 U/L (ref 0–55)
AST: 14 U/L (ref 5–34)
Anion Gap: 9 mEq/L (ref 3–11)
BUN: 19.9 mg/dL (ref 7.0–26.0)
CALCIUM: 9.8 mg/dL (ref 8.4–10.4)
CO2: 28 mEq/L (ref 22–29)
Chloride: 103 mEq/L (ref 98–109)
Creatinine: 0.8 mg/dL (ref 0.6–1.1)
EGFR: 77 mL/min/{1.73_m2} — AB (ref >90)
GLUCOSE: 138 mg/dL (ref 70–140)
POTASSIUM: 4.9 meq/L (ref 3.5–5.1)
Sodium: 140 mEq/L (ref 136–145)
TOTAL PROTEIN: 7.5 g/dL (ref 6.4–8.3)
Total Bilirubin: 0.39 mg/dL (ref 0.20–1.20)

## 2016-12-18 LAB — CBC WITH DIFFERENTIAL (CANCER CENTER ONLY)
BASO#: 0 10*3/uL (ref 0.0–0.2)
BASO%: 0.3 % (ref 0.0–2.0)
EOS ABS: 0.3 10*3/uL (ref 0.0–0.5)
EOS%: 3.8 % (ref 0.0–7.0)
HEMATOCRIT: 31.4 % — AB (ref 34.8–46.6)
HEMOGLOBIN: 10.1 g/dL — AB (ref 11.6–15.9)
LYMPH#: 2.1 10*3/uL (ref 0.9–3.3)
LYMPH%: 29.7 % (ref 14.0–48.0)
MCH: 30.9 pg (ref 26.0–34.0)
MCHC: 32.2 g/dL (ref 32.0–36.0)
MCV: 96 fL (ref 81–101)
MONO#: 0.4 10*3/uL (ref 0.1–0.9)
MONO%: 6 % (ref 0.0–13.0)
NEUT#: 4.3 10*3/uL (ref 1.5–6.5)
NEUT%: 60.2 % (ref 39.6–80.0)
PLATELETS: 418 10*3/uL — AB (ref 145–400)
RBC: 3.27 10*6/uL — AB (ref 3.70–5.32)
RDW: 13 % (ref 11.1–15.7)
WBC: 7.2 10*3/uL (ref 3.9–10.0)

## 2016-12-18 NOTE — Progress Notes (Signed)
Hematology and Oncology Follow Up Visit  Dana [name] [id number] [birth year] 61 y.o. 12/18/2016   Principle Diagnosis:  Iron deficiency anemia Poorly controlled diabetes type II  Current Therapy:   IV iron as indicated - last received in December 2017     Interim History:  [name] is here today for a follow-up. 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 She got through this without any problems.  She now has a brace on her left leg. Not sure how long she will have to wear this. Hopefully, she will have a prosthetic for her right leg soon.  Her last iron studies back in May showed a ferritin of 609 with iron saturation 41%.  She does feel a little tired. It would not surprise me if her iron levels are low.  She's had no problems with bowels or bladder. Her blood sugars have been doing pretty well.  Overall, I see that her performance status is ECOG 2.   Medications:  Allergies as of 12/18/2016      Reactions   Keflex [cephalexin]    Tremors, rash, hard to breathe   Lithium Nausea And Vomiting, Other (See Comments)   Other reaction(s): Other (See Comments) Can not keep this medication down. It makes her terribly ill. Can not keep this medication down. It makes her terribly ill.   Ativan [lorazepam] Other (See Comments)   Other reaction(s): ANAPHYLAXIS Other reaction(s): Other (See Comments) Abnormal behavior Abnormal behavior   Demerol [meperidine] Other (See Comments)   Abnormal behavior (sees things) Other reaction(s): ANAPHYLAXIS Other reaction(s): Other (See Comments) Abnormal behavior Abnormal behavior   Oxycodone Other (See Comments)   Other reaction(s): OTHER Abnormal behavior   Doxycycline Other (See Comments)   Severe muscle tremor   Darvocet [propoxyphene N-acetaminophen] Itching   Latex Itching   Other reaction(s): OTHER   Propoxyphene Itching      Medication List       Accurate as of 12/18/16 12:47 PM. Always use your most recent med list.            amoxicillin-clavulanate 875-125 MG tablet Commonly known as:  AUGMENTIN Take 1 tablet by mouth 2 (two) times daily.   aspirin 325 MG EC tablet Take 325 mg by mouth daily.   atorvastatin 20 MG tablet Commonly known as:  LIPITOR Take 1 tablet (20 mg total) by mouth at bedtime.   escitalopram 5 MG tablet Commonly known as:  LEXAPRO Take 5 mg by mouth 3 (three) times daily.   freestyle lancets Use as instructed to check blood sugar 3 times per day dx code E11.39   FREESTYLE LITE Devi Use to check blood sugar 3 times a day dx code E11.39   FREESTYLE LITE test strip Generic drug:  glucose blood USE AS INSTRUCTED TO CHECK BLOOD SUGAR THREE TIMES DAILY   gabapentin 600 MG tablet Commonly known as:  NEURONTIN TAKE TWO TABLETS BY MOUTH THREE TIMES DAILY   insulin detemir 100 UNIT/ML injection Commonly known as:  LEVEMIR Inject 35 units in the skin daily at 10pm   insulin regular 100 units/mL injection Commonly known as:  NOVOLIN R RELION Take 6 units with breakfast, 8 units with lunch and 8 units with dinner   INSULIN SYRINGE 1CC/29G 29G X 1/2" 1 ML Misc Use 1 per day.   metFORMIN 500 MG tablet Commonly known as:  GLUCOPHAGE TAKE TWO TABLETS BY MOUTH ONCE DAILY IN THE MORNING AND ONE ONCE DAILY IN THE EVENING   OVER THE COUNTER  MEDICATION Take 2 each by mouth daily. Centrum-Muli. Vitamin Gummy   pantoprazole 40 MG tablet Commonly known as:  PROTONIX Take 1 tablet (40 mg total) by mouth daily.       Allergies:  Allergies  Allergen Reactions  . Keflex [Cephalexin]     Tremors, rash, hard to breathe  . Lithium Nausea And Vomiting and Other (See Comments)    Other reaction(s): Other (See Comments) Can not keep this medication down. It makes her terribly ill. Can not keep this medication down. It makes her terribly ill.  . Ativan [Lorazepam] Other (See Comments)    Other reaction(s): ANAPHYLAXIS Other reaction(s): Other (See Comments) Abnormal behavior Abnormal  behavior  . Demerol [Meperidine] Other (See Comments)    Abnormal behavior (sees things) Other reaction(s): ANAPHYLAXIS Other reaction(s): Other (See Comments) Abnormal behavior Abnormal behavior  . Oxycodone Other (See Comments)    Other reaction(s): OTHER Abnormal behavior  . Doxycycline Other (See Comments)    Severe muscle tremor  . Darvocet [Propoxyphene N-Acetaminophen] Itching  . Latex Itching    Other reaction(s): OTHER  . Propoxyphene Itching    Past Medical History, Surgical history, Social history, and Family History were reviewed and updated.  Review of Systems: All other 10 point review of systems is negative.   Physical Exam:  weight is 180 lb (81.6 kg). Her oral temperature is 98.1 F (36.7 C). Her blood pressure is 125/75 and her pulse is 71. Her respiration is 18 and oxygen saturation is 96%.   Wt Readings from Last 3 Encounters:  12/18/16 180 lb (81.6 kg)  12/16/16 163 lb (73.9 kg)  12/02/16 162 lb 9.6 oz (73.8 kg)    Fairly well-developed and well-nourished white female. Head and neck exam shows no ocular or oral lesions. She has no scleral icterus. There is no adenopathy in the neck. Lungs are clear bilaterally. Cardiac exam regular rate and rhythm with no murmurs, rubs or bruits. Abdomen is soft. She is mildly obese. She has good bowel sounds. There is no fluid wave. There is no palpable liver or spleen tip. Extremities shows the right BKA. She has a walking brace on her left lower leg. Neurological exam shows no focal neurological deficits. Skin exam shows some scattered ecchymoses.   Lab Results  Component Value Date   WBC 7.2 12/18/2016   HGB 10.1 (L) 12/18/2016   HCT 31.4 (L) 12/18/2016   MCV 96 12/18/2016   PLT 418 (H) 12/18/2016   Lab Results  Component Value Date   FERRITIN 609 (H) 09/18/2016   IRON 90 09/18/2016   TIBC 216 (L) 09/18/2016   UIBC 127 09/18/2016   IRONPCTSAT 41 09/18/2016   Lab Results  Component Value Date   RETICCTPCT  1.3 02/04/2016   RBC 3.27 (L) 12/18/2016   No results found for: KPAFRELGTCHN, LAMBDASER, KAPLAMBRATIO No results found for: IGGSERUM, IGA, IGMSERUM No results found for: Odetta Pink, SPEI   Chemistry      Component Value Date/Time   NA 140 12/02/2016 1057   NA 142 09/18/2016 1052   NA 139 04/16/2016 1328   K 5.6 (H) 12/02/2016 1057   K 4.5 09/18/2016 1052   K 5.0 04/16/2016 1328   CL 103 12/02/2016 1057   CL 104 09/18/2016 1052   CO2 30 12/02/2016 1057   CO2 29 09/18/2016 1052   CO2 26 04/16/2016 1328   BUN 25 (H) 12/02/2016 1057   BUN 21 09/18/2016 1052   BUN  37.9 (H) 04/16/2016 1328   CREATININE 0.80 12/02/2016 1057   CREATININE 0.9 09/18/2016 1052   CREATININE 1.0 04/16/2016 1328      Component Value Date/Time   CALCIUM 9.9 12/02/2016 1057   CALCIUM 9.5 09/18/2016 1052   CALCIUM 9.9 04/16/2016 1328   ALKPHOS 115 12/02/2016 1057   ALKPHOS 97 (H) 09/18/2016 1052   ALKPHOS 197 (H) 04/16/2016 1328   AST 14 12/02/2016 1057   AST 23 09/18/2016 1052   AST 10 04/16/2016 1328   ALT 20 12/02/2016 1057   ALT 24 09/18/2016 1052   ALT 8 04/16/2016 1328   BILITOT 0.2 12/02/2016 1057   BILITOT 0.50 09/18/2016 1052   BILITOT 0.29 04/16/2016 1328     Impression and Plan: [name] is a very pleasant 61 yo white female with history of iron deficiency anemia and erythropoietin deficiency secondary to uncontrolled diabetes.  We will see what studies show. It would not surprise me if we had to give her some iron.  We have not yet given her Aranesp. I'll try to hold off on this as long as possible.  I will like to see her back in 6 weeks. She I see will be having her 61th birthday in one month.   I am very impressed with how well she is recovered from her surgery.    Volanda Napoleon, MD 8/9/201812:47 PM

## 2016-12-19 ENCOUNTER — Telehealth: Payer: Self-pay | Admitting: *Deleted

## 2016-12-19 LAB — IRON AND TIBC
%SAT: 18 % — ABNORMAL LOW (ref 21–57)
Iron: 46 ug/dL (ref 41–142)
TIBC: 250 ug/dL (ref 236–444)
UIBC: 204 ug/dL (ref 120–384)

## 2016-12-19 LAB — RETICULOCYTES: RETICULOCYTE COUNT: 2.7 % — AB (ref 0.6–2.6)

## 2016-12-19 LAB — FERRITIN: Ferritin: 791 ng/ml — ABNORMAL HIGH (ref 9–269)

## 2016-12-19 NOTE — Telephone Encounter (Signed)
Sent PT mychart message on low potassium foods.

## 2016-12-19 NOTE — Telephone Encounter (Addendum)
Patient is aware of results. Appointment made  ----- Message from Eliezer Bottom, NP sent at 12/19/2016 10:23 AM EDT ----- Regarding: Iron  Iron sat low, will need one dose of IV next week please. LOS sent to [facility]. Thank you!  Sarah  ----- Message ----- From: Interface, Lab In Three Zero One Sent: 12/18/2016  12:04 PM To: Eliezer Bottom, NP

## 2016-12-23 ENCOUNTER — Telehealth: Payer: Self-pay | Admitting: Physician Assistant

## 2016-12-23 ENCOUNTER — Encounter: Payer: Self-pay | Admitting: Physician Assistant

## 2016-12-23 NOTE — Telephone Encounter (Signed)
See below

## 2016-12-23 NOTE — Telephone Encounter (Signed)
Pt asking for a letter from [place] to dismiss her from jury duty due to her disabilities.

## 2016-12-23 NOTE — Telephone Encounter (Signed)
Letter has been written. Is in a sealed envelope up front with her name on it

## 2016-12-24 ENCOUNTER — Ambulatory Visit (HOSPITAL_BASED_OUTPATIENT_CLINIC_OR_DEPARTMENT_OTHER): Payer: PPO

## 2016-12-24 VITALS — BP 121/68 | HR 78 | Temp 97.9°F | Resp 16

## 2016-12-24 DIAGNOSIS — K909 Intestinal malabsorption, unspecified: Secondary | ICD-10-CM

## 2016-12-24 DIAGNOSIS — D508 Other iron deficiency anemias: Secondary | ICD-10-CM

## 2016-12-24 DIAGNOSIS — D509 Iron deficiency anemia, unspecified: Secondary | ICD-10-CM

## 2016-12-24 DIAGNOSIS — D631 Anemia in chronic kidney disease: Secondary | ICD-10-CM

## 2016-12-24 DIAGNOSIS — E113411 Type 2 diabetes mellitus with severe nonproliferative diabetic retinopathy with macular edema, right eye: Secondary | ICD-10-CM | POA: Diagnosis not present

## 2016-12-24 MED ORDER — SODIUM CHLORIDE 0.9 % IV SOLN
510.0000 mg | Freq: Once | INTRAVENOUS | Status: AC
  Administered 2016-12-24: 510 mg via INTRAVENOUS
  Filled 2016-12-24: qty 17

## 2016-12-24 MED ORDER — SODIUM CHLORIDE 0.9 % IV SOLN
Freq: Once | INTRAVENOUS | Status: AC
  Administered 2016-12-24: 13:00:00 via INTRAVENOUS

## 2016-12-24 NOTE — Patient Instructions (Signed)

## 2016-12-25 ENCOUNTER — Ambulatory Visit: Payer: PPO

## 2016-12-25 DIAGNOSIS — Z7982 Long term (current) use of aspirin: Secondary | ICD-10-CM | POA: Diagnosis not present

## 2016-12-25 DIAGNOSIS — Z794 Long term (current) use of insulin: Secondary | ICD-10-CM | POA: Diagnosis not present

## 2016-12-25 DIAGNOSIS — Z885 Allergy status to narcotic agent status: Secondary | ICD-10-CM | POA: Diagnosis not present

## 2016-12-25 DIAGNOSIS — Z89511 Acquired absence of right leg below knee: Secondary | ICD-10-CM | POA: Diagnosis not present

## 2016-12-25 DIAGNOSIS — Z89421 Acquired absence of other right toe(s): Secondary | ICD-10-CM | POA: Diagnosis not present

## 2016-12-25 DIAGNOSIS — D649 Anemia, unspecified: Secondary | ICD-10-CM | POA: Diagnosis not present

## 2016-12-25 DIAGNOSIS — E11610 Type 2 diabetes mellitus with diabetic neuropathic arthropathy: Secondary | ICD-10-CM | POA: Diagnosis not present

## 2016-12-25 DIAGNOSIS — Z89422 Acquired absence of other left toe(s): Secondary | ICD-10-CM | POA: Diagnosis not present

## 2016-12-25 DIAGNOSIS — E11621 Type 2 diabetes mellitus with foot ulcer: Secondary | ICD-10-CM | POA: Diagnosis not present

## 2016-12-25 DIAGNOSIS — I493 Ventricular premature depolarization: Secondary | ICD-10-CM | POA: Diagnosis not present

## 2016-12-25 DIAGNOSIS — L97421 Non-pressure chronic ulcer of left heel and midfoot limited to breakdown of skin: Secondary | ICD-10-CM | POA: Diagnosis not present

## 2016-12-25 DIAGNOSIS — Z7984 Long term (current) use of oral hypoglycemic drugs: Secondary | ICD-10-CM | POA: Diagnosis not present

## 2016-12-25 DIAGNOSIS — I472 Ventricular tachycardia: Secondary | ICD-10-CM | POA: Diagnosis not present

## 2016-12-25 DIAGNOSIS — Z833 Family history of diabetes mellitus: Secondary | ICD-10-CM | POA: Diagnosis not present

## 2016-12-25 DIAGNOSIS — F319 Bipolar disorder, unspecified: Secondary | ICD-10-CM | POA: Diagnosis not present

## 2016-12-25 DIAGNOSIS — E669 Obesity, unspecified: Secondary | ICD-10-CM | POA: Diagnosis not present

## 2016-12-25 DIAGNOSIS — F419 Anxiety disorder, unspecified: Secondary | ICD-10-CM | POA: Diagnosis not present

## 2016-12-25 DIAGNOSIS — M6702 Short Achilles tendon (acquired), left ankle: Secondary | ICD-10-CM | POA: Diagnosis not present

## 2016-12-25 DIAGNOSIS — E1142 Type 2 diabetes mellitus with diabetic polyneuropathy: Secondary | ICD-10-CM | POA: Diagnosis not present

## 2016-12-27 DIAGNOSIS — M86271 Subacute osteomyelitis, right ankle and foot: Secondary | ICD-10-CM | POA: Diagnosis not present

## 2016-12-30 ENCOUNTER — Ambulatory Visit: Payer: PPO | Admitting: Physician Assistant

## 2017-01-02 ENCOUNTER — Ambulatory Visit (INDEPENDENT_AMBULATORY_CARE_PROVIDER_SITE_OTHER): Payer: PPO | Admitting: Physician Assistant

## 2017-01-02 ENCOUNTER — Encounter: Payer: Self-pay | Admitting: Physician Assistant

## 2017-01-02 VITALS — BP 118/70 | HR 68 | Temp 98.1°F | Resp 14 | Ht 64.0 in | Wt 180.0 lb

## 2017-01-02 DIAGNOSIS — Z89511 Acquired absence of right leg below knee: Secondary | ICD-10-CM | POA: Diagnosis not present

## 2017-01-02 DIAGNOSIS — F319 Bipolar disorder, unspecified: Secondary | ICD-10-CM

## 2017-01-02 DIAGNOSIS — K219 Gastro-esophageal reflux disease without esophagitis: Secondary | ICD-10-CM | POA: Diagnosis not present

## 2017-01-02 DIAGNOSIS — J309 Allergic rhinitis, unspecified: Secondary | ICD-10-CM

## 2017-01-02 MED ORDER — BENZONATATE 100 MG PO CAPS: 100.0000 mg | ORAL_CAPSULE | Freq: Three times a day (TID) | ORAL | 0 refills | Status: DC | PRN

## 2017-01-02 MED ORDER — FLUTICASONE PROPIONATE 50 MCG/ACT NA SUSP: 2.0000 | Freq: Every day | NASAL | 0 refills | Status: DC

## 2017-01-02 NOTE — Progress Notes (Signed)
Pre visit review using our clinic review tool, if applicable. No additional management support is needed unless otherwise documented below in the visit note. 

## 2017-01-02 NOTE — Patient Instructions (Signed)
Please start saline nasal rinse 1-2 x day. Use Flonase daily.  Continue Protonix for reflux.  I believe symptoms are a combination of reflux and nasal drainage from allergies.  Use the Tessalon for cough while the other medication are treating the issue.  Stay hydrated and get plenty of rest.   Follow-up if symptoms are not improving over the weekend.

## 2017-01-03 DIAGNOSIS — J309 Allergic rhinitis, unspecified: Secondary | ICD-10-CM | POA: Insufficient documentation

## 2017-01-03 DIAGNOSIS — K219 Gastro-esophageal reflux disease without esophagitis: Secondary | ICD-10-CM | POA: Insufficient documentation

## 2017-01-03 NOTE — Assessment & Plan Note (Signed)
Followed by Psychiatry. Is doing well. Continue care as directed by specialist.

## 2017-01-03 NOTE — Progress Notes (Signed)
Patient presents to clinic today for follow-up of GERD symptoms after starting Protonix. Patient endorses taking medications as directed. Denies side effect. Notes improvement in GERD symptoms. Denies abdominal pain or change to bowel habits. Has noted a week or so of PND with rhinorrhea, causing a dry cough. Denies fever, chills, malaise or fatigue. Notes congestion in the morning only. Denies recent travel or sick contact. Denies chest pain or SOB.   Past Medical History:  Diagnosis Date  . Bipolar 1 disorder ([place])   . Cellulitis and abscess of foot 03/08/2015  . Diabetes mellitus    INSULIN DEPENDENT  . Diabetic neuropathy ([place])   . Diabetic neuropathy ([place])   . 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 H/O hiatal hernia   . Hyperlipidemia   . Hypertension    past hx of  . Iron malabsorption 09/26/2015  . Numbness and tingling    Hx; of in B/LLE and B/LUE  . Other iron deficiency anemias 09/20/2015    Current Outpatient Prescriptions on File Prior to Visit  Medication Sig Dispense Refill  . aspirin 325 MG EC tablet Take 325 mg by mouth daily.    Marland Kitchen atorvastatin (LIPITOR) 20 MG tablet Take 1 tablet (20 mg total) by mouth at bedtime. 30 tablet 5  . Blood Glucose Monitoring Suppl (FREESTYLE LITE) DEVI Use to check blood sugar 3 times a day dx code E11.39 1 each 0  . escitalopram (LEXAPRO) 5 MG tablet Take 5 mg by mouth 3 (three) times daily.     Marland Kitchen FREESTYLE LITE test strip USE AS INSTRUCTED TO CHECK BLOOD SUGAR THREE TIMES DAILY 100 each 3  . gabapentin (NEURONTIN) 600 MG tablet TAKE TWO TABLETS BY MOUTH THREE TIMES DAILY 180 tablet 3  . insulin detemir (LEVEMIR) 100 UNIT/ML injection Inject 35 units in the skin daily at 10pm 20 mL 4  . insulin regular (NOVOLIN R RELION) 100 units/mL injection Take 6 units with breakfast, 8 units with lunch and 8 units with dinner (Patient taking differently: Inject 4-10 Units into the skin 3 (three) times daily before meals. Per meal size, small  meals = less pumps to Vgo and large meal = more pumps to Vgo. 1 pump = 2 units.) 10 mL 3  . INSULIN SYRINGE 1CC/29G 29G X 1/2" 1 ML MISC Use 1 per day. 100 each 2  . Lancets (FREESTYLE) lancets Use as instructed to check blood sugar 3 times per day dx code E11.39 100 each 3  . metFORMIN (GLUCOPHAGE) 500 MG tablet TAKE TWO TABLETS BY MOUTH ONCE DAILY IN THE MORNING AND ONE ONCE DAILY IN THE EVENING 90 tablet 5  . OVER THE COUNTER MEDICATION Take 2 each by mouth daily. Centrum-Muli. Vitamin Gummy    . pantoprazole (PROTONIX) 40 MG tablet Take 1 tablet (40 mg total) by mouth daily. 30 tablet 3   No current facility-administered medications on file prior to visit.     Allergies  Allergen Reactions  . Keflex [Cephalexin]     Tremors, rash, hard to breathe  . Lithium Nausea And Vomiting and Other (See Comments)    Other reaction(s): Other (See Comments) Can not keep this medication down. It makes her terribly ill. Can not keep this medication down. It makes her terribly ill.  . Ativan [Lorazepam] Other (See Comments)    Other reaction(s): ANAPHYLAXIS Other reaction(s): Other (See Comments) Abnormal behavior Abnormal behavior  . Demerol [Meperidine] Other (See Comments)    Abnormal behavior (sees things) Other reaction(s): ANAPHYLAXIS  Other reaction(s): Other (See Comments) Abnormal behavior Abnormal behavior  . Oxycodone Other (See Comments)    Other reaction(s): OTHER Abnormal behavior  . Doxycycline Other (See Comments)    Severe muscle tremor  . Darvocet [Propoxyphene N-Acetaminophen] Itching  . Latex Itching    Other reaction(s): OTHER  . Propoxyphene Itching    Family History  Problem Relation Age of Onset  . Diabetes Mother   . Mental illness Mother   . Bipolar disorder Mother   . Hypertension Father   . Diabetes Maternal Grandmother   . Heart disease Neg Hx     Social History   Social History  . Marital status: Married    Spouse name: N/A  . Number of children:  2  . Years of education: 14   Occupational History  .      disabled   Social History Main Topics  . Smoking status: Never Smoker  . Smokeless tobacco: Never Used  . Alcohol use No  . Drug use: No  . Sexual activity: No   Other Topics Concern  . None   Social History Narrative   Lives with husband in home   Caffeine use - Coke 2 or 3 16 oz daily    Review of Systems - See HPI.  All other ROS are negative.  BP 118/70   Pulse 68   Temp 98.1 F (36.7 C) (Oral)   Resp 14   Ht '5\' 4"'  (1.626 m)   Wt 180 lb (81.6 kg)   SpO2 97%   BMI 30.90 kg/m   Physical Exam  Constitutional: She is oriented to person, place, and time and well-developed, well-nourished, and in no distress.  HENT:  Head: Normocephalic and atraumatic.  Nose: Mucosal edema and rhinorrhea present. Right sinus exhibits no maxillary sinus tenderness and no frontal sinus tenderness. Left sinus exhibits no maxillary sinus tenderness and no frontal sinus tenderness.  Eyes: Conjunctivae are normal.  Neck: Neck supple.  Cardiovascular: Normal rate, regular rhythm, normal heart sounds and intact distal pulses.   Pulmonary/Chest: Effort normal and breath sounds normal. No respiratory distress. She has no wheezes. She has no rales. She exhibits no tenderness.  Neurological: She is alert and oriented to person, place, and time.  Skin: Skin is warm and dry. No rash noted.  Psychiatric: Affect normal.  Vitals reviewed.   Recent Results (from the past 2160 hour(s))  Comp Met (CMET)     Status: Abnormal   Collection Time: 10/07/16  2:13 PM  Result Value Ref Range   Sodium 139 135 - 145 mEq/L   Potassium 5.8 (H) 3.5 - 5.1 mEq/L   Chloride 104 96 - 112 mEq/L   CO2 27 19 - 32 mEq/L   Glucose, Bld 172 (H) 70 - 99 mg/dL   BUN 34 (H) 6 - 23 mg/dL   Creatinine, Ser 1.07 0.40 - 1.20 mg/dL   Total Bilirubin 0.3 0.2 - 1.2 mg/dL   Alkaline Phosphatase 88 39 - 117 U/L   AST 13 0 - 37 U/L   ALT 14 0 - 35 U/L   Total Protein  6.9 6.0 - 8.3 g/dL   Albumin 3.9 3.5 - 5.2 g/dL   Calcium 9.5 8.4 - 10.5 mg/dL   GFR 55.65 (L) >60.00 mL/min  Basic metabolic panel     Status: Abnormal   Collection Time: 10/13/16  4:31 PM  Result Value Ref Range   Sodium 139 135 - 145 mEq/L   Potassium 5.5 (H) 3.5 -  5.1 mEq/L   Chloride 105 96 - 112 mEq/L   CO2 27 19 - 32 mEq/L   Glucose, Bld 131 (H) 70 - 99 mg/dL   BUN 32 (H) 6 - 23 mg/dL   Creatinine, Ser 1.03 0.40 - 1.20 mg/dL   Calcium 9.4 8.4 - 10.5 mg/dL   GFR 58.15 (L) >60.00 mL/min  Basic metabolic panel     Status: Abnormal   Collection Time: 10/22/16  1:36 PM  Result Value Ref Range   Sodium 142 135 - 145 mEq/L   Potassium 5.2 (H) 3.5 - 5.1 mEq/L   Chloride 103 96 - 112 mEq/L   CO2 29 19 - 32 mEq/L   Glucose, Bld 101 (H) 70 - 99 mg/dL   BUN 31 (H) 6 - 23 mg/dL   Creatinine, Ser 0.99 0.40 - 1.20 mg/dL   Calcium 9.5 8.4 - 10.5 mg/dL   GFR 60.86 >60.00 mL/min  Comprehensive metabolic panel     Status: Abnormal   Collection Time: 12/02/16 10:57 AM  Result Value Ref Range   Sodium 140 135 - 145 mEq/L   Potassium 5.6 (H) 3.5 - 5.1 mEq/L   Chloride 103 96 - 112 mEq/L   CO2 30 19 - 32 mEq/L   Glucose, Bld 117 (H) 70 - 99 mg/dL   BUN 25 (H) 6 - 23 mg/dL   Creatinine, Ser 0.80 0.40 - 1.20 mg/dL   Total Bilirubin 0.2 0.2 - 1.2 mg/dL   Alkaline Phosphatase 115 39 - 117 U/L   AST 14 0 - 37 U/L   ALT 20 0 - 35 U/L   Total Protein 7.3 6.0 - 8.3 g/dL   Albumin 3.2 (L) 3.5 - 5.2 g/dL   Calcium 9.9 8.4 - 10.5 mg/dL   GFR 77.80 >60.00 mL/min  Hemoglobin A1c     Status: Abnormal   Collection Time: 12/02/16 10:57 AM  Result Value Ref Range   Hgb A1c MFr Bld 7.1 (H) 4.6 - 6.5 %    Comment: Glycemic Control Guidelines for People with Diabetes:Non Diabetic:  <6%Goal of Therapy: <7%Additional Action Suggested:  >8%   CBC w/Diff     Status: Abnormal   Collection Time: 12/18/16 11:56 AM  Result Value Ref Range   WBC 7.2 3.9 - 10.0 10e3/uL   RBC 3.27 (L) 3.70 - 5.32 10e6/uL    HGB 10.1 (L) 11.6 - 15.9 g/dL   HCT 31.4 (L) 34.8 - 46.6 %   MCV 96 81 - 101 fL   MCH 30.9 26.0 - 34.0 pg   MCHC 32.2 32.0 - 36.0 g/dL   RDW 13.0 11.1 - 15.7 %   Platelets 418 (H) 145 - 400 10e3/uL   NEUT# 4.3 1.5 - 6.5 10e3/uL   LYMPH# 2.1 0.9 - 3.3 10e3/uL   MONO# 0.4 0.1 - 0.9 10e3/uL   Eosinophils Absolute 0.3 0.0 - 0.5 10e3/uL   BASO# 0.0 0.0 - 0.2 10e3/uL   NEUT% 60.2 39.6 - 80.0 %   LYMPH% 29.7 14.0 - 48.0 %   MONO% 6.0 0.0 - 13.0 %   EOS% 3.8 0.0 - 7.0 %   BASO% 0.3 0.0 - 2.0 %  CMP     Status: Abnormal   Collection Time: 12/18/16 11:56 AM  Result Value Ref Range   Sodium 140 136 - 145 mEq/L   Potassium 4.9 3.5 - 5.1 mEq/L   Chloride 103 98 - 109 mEq/L   CO2 28 22 - 29 mEq/L   Glucose 138 70 -  140 mg/dl    Comment: Glucose reference range is for nonfasting patients. Fasting glucose reference range is 70- 100.   BUN 19.9 7.0 - 26.0 mg/dL   Creatinine 0.8 0.6 - 1.1 mg/dL   Total Bilirubin 0.39 0.20 - 1.20 mg/dL   Alkaline Phosphatase 213 (H) 40 - 150 U/L   AST 14 5 - 34 U/L   ALT 19 0 - 55 U/L   Total Protein 7.5 6.4 - 8.3 g/dL   Albumin 2.8 (L) 3.5 - 5.0 g/dL   Calcium 9.8 8.4 - 10.4 mg/dL   Anion Gap 9 3 - 11 mEq/L   EGFR 77 (L) >90 ml/min/1.73 m2    Comment: eGFR is calculated using the CKD-EPI Creatinine Equation (2009)  Ferritin     Status: Abnormal   Collection Time: 12/18/16 11:56 AM  Result Value Ref Range   Ferritin 791 (H) 9 - 269 ng/ml  Iron and TIBC     Status: Abnormal   Collection Time: 12/18/16 11:56 AM  Result Value Ref Range   Iron 46 41 - 142 ug/dL   TIBC 250 236 - 444 ug/dL   UIBC 204 120 - 384 ug/dL   %SAT 18 (L) 21 - 57 %  Reticulocytes     Status: Abnormal   Collection Time: 12/18/16 11:56 AM  Result Value Ref Range   Reticulocyte Count 2.7 (H) 0.6 - 2.6 %    Assessment/Plan: Bipolar 1 disorder ([place]) Followed by Psychiatry. Is doing well. Continue care as directed by specialist.   Allergic rhinitis Start saline nasal rinses and  Flonase. Supportive measures and OTC medications reviewed. Follow-up if not quickly improving.   Gastroesophageal reflux disease without esophagitis Much improved with Protonix. Will continue for now. Continue GERD diet.     [name], PA-C

## 2017-01-03 NOTE — Assessment & Plan Note (Signed)
Start saline nasal rinses and Flonase. Supportive measures and OTC medications reviewed. Follow-up if not quickly improving.

## 2017-01-03 NOTE — Assessment & Plan Note (Signed)
Much improved with Protonix. Will continue for now. Continue GERD diet.

## 2017-01-05 ENCOUNTER — Other Ambulatory Visit: Payer: Self-pay | Admitting: Physician Assistant

## 2017-01-05 DIAGNOSIS — Z89422 Acquired absence of other left toe(s): Secondary | ICD-10-CM | POA: Diagnosis not present

## 2017-01-05 DIAGNOSIS — I493 Ventricular premature depolarization: Secondary | ICD-10-CM | POA: Diagnosis not present

## 2017-01-05 DIAGNOSIS — Z89419 Acquired absence of unspecified great toe: Secondary | ICD-10-CM | POA: Diagnosis not present

## 2017-01-05 DIAGNOSIS — Z885 Allergy status to narcotic agent status: Secondary | ICD-10-CM | POA: Diagnosis not present

## 2017-01-05 DIAGNOSIS — F319 Bipolar disorder, unspecified: Secondary | ICD-10-CM | POA: Diagnosis not present

## 2017-01-05 DIAGNOSIS — E11610 Type 2 diabetes mellitus with diabetic neuropathic arthropathy: Secondary | ICD-10-CM | POA: Diagnosis not present

## 2017-01-05 DIAGNOSIS — M6702 Short Achilles tendon (acquired), left ankle: Secondary | ICD-10-CM | POA: Diagnosis not present

## 2017-01-05 DIAGNOSIS — Z833 Family history of diabetes mellitus: Secondary | ICD-10-CM | POA: Diagnosis not present

## 2017-01-05 DIAGNOSIS — Z794 Long term (current) use of insulin: Secondary | ICD-10-CM | POA: Diagnosis not present

## 2017-01-05 DIAGNOSIS — F419 Anxiety disorder, unspecified: Secondary | ICD-10-CM | POA: Diagnosis not present

## 2017-01-05 DIAGNOSIS — E669 Obesity, unspecified: Secondary | ICD-10-CM | POA: Diagnosis not present

## 2017-01-05 DIAGNOSIS — D649 Anemia, unspecified: Secondary | ICD-10-CM | POA: Diagnosis not present

## 2017-01-05 DIAGNOSIS — L97421 Non-pressure chronic ulcer of left heel and midfoot limited to breakdown of skin: Secondary | ICD-10-CM | POA: Diagnosis not present

## 2017-01-05 DIAGNOSIS — E11621 Type 2 diabetes mellitus with foot ulcer: Secondary | ICD-10-CM | POA: Diagnosis not present

## 2017-01-05 DIAGNOSIS — I472 Ventricular tachycardia: Secondary | ICD-10-CM | POA: Diagnosis not present

## 2017-01-05 DIAGNOSIS — Z89421 Acquired absence of other right toe(s): Secondary | ICD-10-CM | POA: Diagnosis not present

## 2017-01-05 DIAGNOSIS — Z7984 Long term (current) use of oral hypoglycemic drugs: Secondary | ICD-10-CM | POA: Diagnosis not present

## 2017-01-05 DIAGNOSIS — M549 Dorsalgia, unspecified: Secondary | ICD-10-CM | POA: Diagnosis not present

## 2017-01-05 DIAGNOSIS — E1142 Type 2 diabetes mellitus with diabetic polyneuropathy: Secondary | ICD-10-CM | POA: Diagnosis not present

## 2017-01-05 DIAGNOSIS — Z7982 Long term (current) use of aspirin: Secondary | ICD-10-CM | POA: Diagnosis not present

## 2017-01-06 DIAGNOSIS — S91329A Laceration with foreign body, unspecified foot, initial encounter: Secondary | ICD-10-CM | POA: Diagnosis not present

## 2017-01-08 DIAGNOSIS — Z79899 Other long term (current) drug therapy: Secondary | ICD-10-CM | POA: Diagnosis not present

## 2017-01-08 DIAGNOSIS — F3189 Other bipolar disorder: Secondary | ICD-10-CM | POA: Diagnosis not present

## 2017-01-15 ENCOUNTER — Telehealth: Payer: Self-pay | Admitting: Physician Assistant

## 2017-01-15 NOTE — Telephone Encounter (Signed)
Spoke with patient about her current symptoms. She is having sob with exertion, wheezing, cough. She has to rest and take breaks when doing activity. She tried Mucinex which didn't help and finished up the Tessalon pearl.  Please advise.

## 2017-01-15 NOTE — Telephone Encounter (Signed)
Recommend she get reassessment -- may need breathing treatment.

## 2017-01-15 NOTE — Telephone Encounter (Signed)
Pt states that she gets out of breath when up and moving around and wasn't sure if this whs due to not feeling well, pt states that she still has cough and was out of cough meds tessalon and did not know if she should take more.

## 2017-01-15 NOTE — Telephone Encounter (Signed)
Spoke with patient and gave her the recommendations from [facility] that she would need to be re-evaluated and may need breathing treatment.  She will find out the time of her procedure tomorrow and will call back to schedule an appointment.

## 2017-01-16 ENCOUNTER — Ambulatory Visit (INDEPENDENT_AMBULATORY_CARE_PROVIDER_SITE_OTHER): Payer: PPO | Admitting: Physician Assistant

## 2017-01-16 ENCOUNTER — Encounter: Payer: Self-pay | Admitting: Physician Assistant

## 2017-01-16 ENCOUNTER — Ambulatory Visit: Payer: PPO | Admitting: Physician Assistant

## 2017-01-16 VITALS — BP 112/80 | HR 92 | Temp 97.7°F | Resp 14

## 2017-01-16 DIAGNOSIS — R0602 Shortness of breath: Secondary | ICD-10-CM | POA: Diagnosis not present

## 2017-01-16 DIAGNOSIS — E11610 Type 2 diabetes mellitus with diabetic neuropathic arthropathy: Secondary | ICD-10-CM | POA: Diagnosis not present

## 2017-01-16 DIAGNOSIS — L97529 Non-pressure chronic ulcer of other part of left foot with unspecified severity: Secondary | ICD-10-CM | POA: Diagnosis not present

## 2017-01-16 DIAGNOSIS — E11621 Type 2 diabetes mellitus with foot ulcer: Secondary | ICD-10-CM | POA: Diagnosis not present

## 2017-01-16 DIAGNOSIS — M6702 Short Achilles tendon (acquired), left ankle: Secondary | ICD-10-CM | POA: Insufficient documentation

## 2017-01-16 DIAGNOSIS — M14672 Charcot's joint, left ankle and foot: Secondary | ICD-10-CM | POA: Diagnosis not present

## 2017-01-16 MED ORDER — ALBUTEROL SULFATE (2.5 MG/3ML) 0.083% IN NEBU
2.5000 mg | INHALATION_SOLUTION | Freq: Once | RESPIRATORY_TRACT | Status: AC
  Administered 2017-01-16: 2.5 mg via RESPIRATORY_TRACT

## 2017-01-16 MED ORDER — BUDESONIDE-FORMOTEROL FUMARATE 80-4.5 MCG/ACT IN AERO: 2.0000 | INHALATION_SPRAY | Freq: Two times a day (BID) | RESPIRATORY_TRACT | 3 refills | Status: DC

## 2017-01-16 NOTE — Patient Instructions (Addendum)
[facility]. I will call you with your results. Please start Symbicort as directed. Take Doxycycline as directed by your Surgeon.  Follow-up with me Monday. Wear a mask if you have to help Ricky. Avoid contact with chemicas.   If anything worsens, please go to the ER.

## 2017-01-16 NOTE — Progress Notes (Signed)
Patient presents to clinic today c/o 1 week of dry cough with chest congestion and occasional wheezing. Has also noted some shortness of breath with exertion. Denies fever, chills, chest pain, myalgias. Denies recent travel or sick contact. Denies any heartburn or reflux while taking her PPI. Of note, patient has been cleaning up after her husband, noting that he is urinating and defecating into a bucket at night as he cannot get to the bathroom. She has been cleaning this out and noted symptoms started once she started doing this. Has history of asthma but states she has not had any issue with this for several years after significant weight loss.   Past Medical History:  Diagnosis Date  . Bipolar 1 disorder ([place])   . Cellulitis and abscess of foot 03/08/2015  . Diabetes mellitus    INSULIN DEPENDENT  . Diabetic neuropathy ([place])   . Diabetic neuropathy ([place])   . 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 H/O hiatal hernia   . Hyperlipidemia   . Hypertension    past hx of  . Iron malabsorption 09/26/2015  . Numbness and tingling    Hx; of in B/LLE and B/LUE  . Other iron deficiency anemias 09/20/2015    Current Outpatient Prescriptions on File Prior to Visit  Medication Sig Dispense Refill  . aspirin 325 MG EC tablet Take 325 mg by mouth daily.    Marland Kitchen atorvastatin (LIPITOR) 20 MG tablet Take 1 tablet (20 mg total) by mouth at bedtime. 30 tablet 5  . Blood Glucose Monitoring Suppl (FREESTYLE LITE) DEVI Use to check blood sugar 3 times a day dx code E11.39 1 each 0  . escitalopram (LEXAPRO) 5 MG tablet Take 5 mg by mouth 3 (three) times daily.     . fluticasone (FLONASE) 50 MCG/ACT nasal spray Place 2 sprays into both nostrils daily. 16 g 0  . FREESTYLE LITE test strip USE AS INSTRUCTED TO CHECK BLOOD SUGAR THREE TIMES DAILY 100 each 3  . gabapentin (NEURONTIN) 600 MG tablet TAKE 2 TABLETS BY MOUTH THREE TIMES DAILY 540 tablet 1  . insulin detemir (LEVEMIR) 100 UNIT/ML injection Inject  35 units in the skin daily at 10pm 20 mL 4  . insulin regular (NOVOLIN R RELION) 100 units/mL injection Take 6 units with breakfast, 8 units with lunch and 8 units with dinner (Patient taking differently: Inject 4-10 Units into the skin 3 (three) times daily before meals. Per meal size, small meals = less pumps to Vgo and large meal = more pumps to Vgo. 1 pump = 2 units.) 10 mL 3  . INSULIN SYRINGE 1CC/29G 29G X 1/2" 1 ML MISC Use 1 per day. 100 each 2  . Lancets (FREESTYLE) lancets Use as instructed to check blood sugar 3 times per day dx code E11.39 100 each 3  . metFORMIN (GLUCOPHAGE) 500 MG tablet TAKE TWO TABLETS BY MOUTH ONCE DAILY IN THE MORNING AND ONE ONCE DAILY IN THE EVENING 90 tablet 5  . OVER THE COUNTER MEDICATION Take 2 each by mouth daily. Centrum-Muli. Vitamin Gummy    . pantoprazole (PROTONIX) 40 MG tablet Take 1 tablet (40 mg total) by mouth daily. 30 tablet 3   No current facility-administered medications on file prior to visit.     Allergies  Allergen Reactions  . Keflex [Cephalexin]     Tremors, rash, hard to breathe  . Lithium Nausea And Vomiting and Other (See Comments)    Other reaction(s): Other (See Comments) Can not keep this  medication down. It makes her terribly ill. Can not keep this medication down. It makes her terribly ill.  . Ativan [Lorazepam] Other (See Comments)    Other reaction(s): ANAPHYLAXIS Other reaction(s): Other (See Comments) Abnormal behavior Abnormal behavior  . Demerol [Meperidine] Other (See Comments)    Abnormal behavior (sees things) Other reaction(s): ANAPHYLAXIS Other reaction(s): Other (See Comments) Abnormal behavior Abnormal behavior  . Oxycodone Other (See Comments)    Other reaction(s): OTHER Abnormal behavior  . Codeine Itching and Other (See Comments)    hallucinations  . Doxycycline Other (See Comments)    Severe muscle tremor  . Darvocet [Propoxyphene N-Acetaminophen] Itching  . Latex Itching    Other reaction(s):  OTHER  . Propoxyphene Itching    Family History  Problem Relation Age of Onset  . Diabetes Mother   . Mental illness Mother   . Bipolar disorder Mother   . Hypertension Father   . Diabetes Maternal Grandmother   . Heart disease Neg Hx     Social History   Social History  . Marital status: Married    Spouse name: N/A  . Number of children: 2  . Years of education: 14   Occupational History  .      disabled   Social History Main Topics  . Smoking status: Never Smoker  . Smokeless tobacco: Never Used  . Alcohol use No  . Drug use: No  . Sexual activity: No   Other Topics Concern  . None   Social History Narrative   Lives with husband in home   Caffeine use - Coke 2 or 3 16 oz daily   Review of Systems - See HPI.  All other ROS are negative.  BP 112/80   Pulse 92   Temp 97.7 F (36.5 C) (Oral)   Resp 14   SpO2 94%   Physical Exam  Constitutional: She is oriented to person, place, and time and well-developed, well-nourished, and in no distress.  HENT:  Head: Normocephalic and atraumatic.  Right Ear: External ear normal.  Left Ear: External ear normal.  Nose: Nose normal.  Mouth/Throat: Oropharynx is clear and moist. No oropharyngeal exudate.  TM within normal limits.  Eyes: Conjunctivae are normal.  Neck: Neck supple.  Cardiovascular: Normal rate, regular rhythm, normal heart sounds and intact distal pulses.   Pulmonary/Chest: No respiratory distress. She has wheezes. She has no rales. She exhibits no tenderness.  Lymphadenopathy:    She has no cervical adenopathy.  Neurological: She is alert and oriented to person, place, and time.  Skin: Skin is warm and dry. No rash noted.  Psychiatric: Affect normal.  Vitals reviewed.  Recent Results (from the past 2160 hour(s))  Basic metabolic panel     Status: Abnormal   Collection Time: 10/22/16  1:36 PM  Result Value Ref Range   Sodium 142 135 - 145 mEq/L   Potassium 5.2 (H) 3.5 - 5.1 mEq/L   Chloride 103  96 - 112 mEq/L   CO2 29 19 - 32 mEq/L   Glucose, Bld 101 (H) 70 - 99 mg/dL   BUN 31 (H) 6 - 23 mg/dL   Creatinine, Ser 0.99 0.40 - 1.20 mg/dL   Calcium 9.5 8.4 - 10.5 mg/dL   GFR 60.86 >60.00 mL/min  Comprehensive metabolic panel     Status: Abnormal   Collection Time: 12/02/16 10:57 AM  Result Value Ref Range   Sodium 140 135 - 145 mEq/L   Potassium 5.6 (H) 3.5 - 5.1 mEq/L  Chloride 103 96 - 112 mEq/L   CO2 30 19 - 32 mEq/L   Glucose, Bld 117 (H) 70 - 99 mg/dL   BUN 25 (H) 6 - 23 mg/dL   Creatinine, Ser 0.80 0.40 - 1.20 mg/dL   Total Bilirubin 0.2 0.2 - 1.2 mg/dL   Alkaline Phosphatase 115 39 - 117 U/L   AST 14 0 - 37 U/L   ALT 20 0 - 35 U/L   Total Protein 7.3 6.0 - 8.3 g/dL   Albumin 3.2 (L) 3.5 - 5.2 g/dL   Calcium 9.9 8.4 - 10.5 mg/dL   GFR 77.80 >60.00 mL/min  Hemoglobin A1c     Status: Abnormal   Collection Time: 12/02/16 10:57 AM  Result Value Ref Range   Hgb A1c MFr Bld 7.1 (H) 4.6 - 6.5 %    Comment: Glycemic Control Guidelines for People with Diabetes:Non Diabetic:  <6%Goal of Therapy: <7%Additional Action Suggested:  >8%   CBC w/Diff     Status: Abnormal   Collection Time: 12/18/16 11:56 AM  Result Value Ref Range   WBC 7.2 3.9 - 10.0 10e3/uL   RBC 3.27 (L) 3.70 - 5.32 10e6/uL   HGB 10.1 (L) 11.6 - 15.9 g/dL   HCT 31.4 (L) 34.8 - 46.6 %   MCV 96 81 - 101 fL   MCH 30.9 26.0 - 34.0 pg   MCHC 32.2 32.0 - 36.0 g/dL   RDW 13.0 11.1 - 15.7 %   Platelets 418 (H) 145 - 400 10e3/uL   NEUT# 4.3 1.5 - 6.5 10e3/uL   LYMPH# 2.1 0.9 - 3.3 10e3/uL   MONO# 0.4 0.1 - 0.9 10e3/uL   Eosinophils Absolute 0.3 0.0 - 0.5 10e3/uL   BASO# 0.0 0.0 - 0.2 10e3/uL   NEUT% 60.2 39.6 - 80.0 %   LYMPH% 29.7 14.0 - 48.0 %   MONO% 6.0 0.0 - 13.0 %   EOS% 3.8 0.0 - 7.0 %   BASO% 0.3 0.0 - 2.0 %  CMP     Status: Abnormal   Collection Time: 12/18/16 11:56 AM  Result Value Ref Range   Sodium 140 136 - 145 mEq/L   Potassium 4.9 3.5 - 5.1 mEq/L   Chloride 103 98 - 109 mEq/L   CO2 28 22  - 29 mEq/L   Glucose 138 70 - 140 mg/dl    Comment: Glucose reference range is for nonfasting patients. Fasting glucose reference range is 70- 100.   BUN 19.9 7.0 - 26.0 mg/dL   Creatinine 0.8 0.6 - 1.1 mg/dL   Total Bilirubin 0.39 0.20 - 1.20 mg/dL   Alkaline Phosphatase 213 (H) 40 - 150 U/L   AST 14 5 - 34 U/L   ALT 19 0 - 55 U/L   Total Protein 7.5 6.4 - 8.3 g/dL   Albumin 2.8 (L) 3.5 - 5.0 g/dL   Calcium 9.8 8.4 - 10.4 mg/dL   Anion Gap 9 3 - 11 mEq/L   EGFR 77 (L) >90 ml/min/1.73 m2    Comment: eGFR is calculated using the CKD-EPI Creatinine Equation (2009)  Ferritin     Status: Abnormal   Collection Time: 12/18/16 11:56 AM  Result Value Ref Range   Ferritin 791 (H) 9 - 269 ng/ml  Iron and TIBC     Status: Abnormal   Collection Time: 12/18/16 11:56 AM  Result Value Ref Range   Iron 46 41 - 142 ug/dL   TIBC 250 236 - 444 ug/dL   UIBC 204 120 -  384 ug/dL   %SAT 18 (L) 21 - 57 %  Reticulocytes     Status: Abnormal   Collection Time: 12/18/16 11:56 AM  Result Value Ref Range   Reticulocyte Count 2.7 (H) 0.6 - 2.6 %   Assessment/Plan: 1. SOB (shortness of breath) on exertion Seems related to mild asthma exacerbation. Question developing bronchitis verus a pneumonitis giving exposure to irritants. Patient given albuterol neb in office with improvement in breathing. She was then ambulated around the office. O2 remaining stable after breathing treatment and SOB with exertion improved significantly per patient.  She has been started on Doxycycline for other reasons by her Surgeon. Encouraged her to continue as directed. Will start Symbicort. Rescue inhaler as directed. She is to have STAT CXR today. Supportive measures discussed. She is to avoid messing with husband's excrement. Home Health to be sent out for assessment as husband in also a patient here. Strict precautions given to patient that if anything worsens or new symptoms develop, she is to go straight to the ER.    -  albuterol (PROVENTIL) (2.5 MG/3ML) 0.083% nebulizer solution 2.5 mg; Take 3 mLs (2.5 mg total) by nebulization once. - DG Chest 2 View; Future    [name], PA-C

## 2017-01-16 NOTE — Progress Notes (Signed)
Pre visit review using our clinic review tool, if applicable. No additional management support is needed unless otherwise documented below in the visit note. 

## 2017-01-19 ENCOUNTER — Other Ambulatory Visit: Payer: Self-pay

## 2017-01-19 ENCOUNTER — Encounter: Payer: Self-pay | Admitting: Physician Assistant

## 2017-01-19 ENCOUNTER — Emergency Department (HOSPITAL_BASED_OUTPATIENT_CLINIC_OR_DEPARTMENT_OTHER): Payer: PPO

## 2017-01-19 ENCOUNTER — Inpatient Hospital Stay (HOSPITAL_BASED_OUTPATIENT_CLINIC_OR_DEPARTMENT_OTHER)
Admission: EM | Admit: 2017-01-19 | Discharge: 2017-01-23 | DRG: 193 | Disposition: A | Discharge status: Home Health | Payer: PPO | Attending: Internal Medicine | Admitting: Internal Medicine

## 2017-01-19 ENCOUNTER — Ambulatory Visit (INDEPENDENT_AMBULATORY_CARE_PROVIDER_SITE_OTHER): Payer: PPO | Admitting: Physician Assistant

## 2017-01-19 ENCOUNTER — Encounter (HOSPITAL_BASED_OUTPATIENT_CLINIC_OR_DEPARTMENT_OTHER): Payer: Self-pay | Admitting: *Deleted

## 2017-01-19 VITALS — BP 120/72 | HR 83 | Temp 97.7°F | Resp 14 | Ht 64.0 in | Wt 180.0 lb

## 2017-01-19 DIAGNOSIS — J189 Pneumonia, unspecified organism: Secondary | ICD-10-CM | POA: Diagnosis present

## 2017-01-19 DIAGNOSIS — Z89511 Acquired absence of right leg below knee: Secondary | ICD-10-CM | POA: Diagnosis not present

## 2017-01-19 DIAGNOSIS — R0602 Shortness of breath: Secondary | ICD-10-CM

## 2017-01-19 DIAGNOSIS — Z6833 Body mass index (BMI) 33.0-33.9, adult: Secondary | ICD-10-CM

## 2017-01-19 DIAGNOSIS — J948 Other specified pleural conditions: Secondary | ICD-10-CM | POA: Diagnosis not present

## 2017-01-19 DIAGNOSIS — R0689 Other abnormalities of breathing: Secondary | ICD-10-CM | POA: Diagnosis not present

## 2017-01-19 DIAGNOSIS — D638 Anemia in other chronic diseases classified elsewhere: Secondary | ICD-10-CM | POA: Diagnosis present

## 2017-01-19 DIAGNOSIS — Z794 Long term (current) use of insulin: Secondary | ICD-10-CM

## 2017-01-19 DIAGNOSIS — J9601 Acute respiratory failure with hypoxia: Secondary | ICD-10-CM | POA: Diagnosis present

## 2017-01-19 DIAGNOSIS — J9811 Atelectasis: Secondary | ICD-10-CM | POA: Diagnosis not present

## 2017-01-19 DIAGNOSIS — E1169 Type 2 diabetes mellitus with other specified complication: Secondary | ICD-10-CM | POA: Diagnosis present

## 2017-01-19 DIAGNOSIS — E785 Hyperlipidemia, unspecified: Secondary | ICD-10-CM | POA: Diagnosis present

## 2017-01-19 DIAGNOSIS — R188 Other ascites: Secondary | ICD-10-CM

## 2017-01-19 DIAGNOSIS — E875 Hyperkalemia: Secondary | ICD-10-CM | POA: Diagnosis present

## 2017-01-19 DIAGNOSIS — Z90710 Acquired absence of both cervix and uterus: Secondary | ICD-10-CM

## 2017-01-19 DIAGNOSIS — F319 Bipolar disorder, unspecified: Secondary | ICD-10-CM | POA: Diagnosis present

## 2017-01-19 DIAGNOSIS — E119 Type 2 diabetes mellitus without complications: Secondary | ICD-10-CM

## 2017-01-19 DIAGNOSIS — Z7984 Long term (current) use of oral hypoglycemic drugs: Secondary | ICD-10-CM | POA: Diagnosis not present

## 2017-01-19 DIAGNOSIS — Z7982 Long term (current) use of aspirin: Secondary | ICD-10-CM

## 2017-01-19 DIAGNOSIS — Z79899 Other long term (current) drug therapy: Secondary | ICD-10-CM | POA: Diagnosis not present

## 2017-01-19 DIAGNOSIS — J181 Lobar pneumonia, unspecified organism: Secondary | ICD-10-CM | POA: Diagnosis not present

## 2017-01-19 DIAGNOSIS — J90 Pleural effusion, not elsewhere classified: Secondary | ICD-10-CM | POA: Diagnosis present

## 2017-01-19 DIAGNOSIS — Z818 Family history of other mental and behavioral disorders: Secondary | ICD-10-CM

## 2017-01-19 DIAGNOSIS — E669 Obesity, unspecified: Secondary | ICD-10-CM | POA: Diagnosis present

## 2017-01-19 DIAGNOSIS — Z7951 Long term (current) use of inhaled steroids: Secondary | ICD-10-CM

## 2017-01-19 DIAGNOSIS — Z8249 Family history of ischemic heart disease and other diseases of the circulatory system: Secondary | ICD-10-CM | POA: Diagnosis not present

## 2017-01-19 DIAGNOSIS — Z993 Dependence on wheelchair: Secondary | ICD-10-CM | POA: Diagnosis not present

## 2017-01-19 DIAGNOSIS — Z23 Encounter for immunization: Secondary | ICD-10-CM

## 2017-01-19 DIAGNOSIS — E11610 Type 2 diabetes mellitus with diabetic neuropathic arthropathy: Secondary | ICD-10-CM | POA: Diagnosis not present

## 2017-01-19 DIAGNOSIS — I10 Essential (primary) hypertension: Secondary | ICD-10-CM | POA: Diagnosis present

## 2017-01-19 DIAGNOSIS — E1142 Type 2 diabetes mellitus with diabetic polyneuropathy: Secondary | ICD-10-CM | POA: Diagnosis present

## 2017-01-19 DIAGNOSIS — Z833 Family history of diabetes mellitus: Secondary | ICD-10-CM

## 2017-01-19 DIAGNOSIS — I340 Nonrheumatic mitral (valve) insufficiency: Secondary | ICD-10-CM | POA: Diagnosis not present

## 2017-01-19 DIAGNOSIS — L97522 Non-pressure chronic ulcer of other part of left foot with fat layer exposed: Secondary | ICD-10-CM | POA: Diagnosis not present

## 2017-01-19 DIAGNOSIS — E11621 Type 2 diabetes mellitus with foot ulcer: Secondary | ICD-10-CM | POA: Diagnosis not present

## 2017-01-19 LAB — TROPONIN I

## 2017-01-19 LAB — COMPREHENSIVE METABOLIC PANEL
ALK PHOS: 370 U/L — AB (ref 38–126)
ALT: 64 U/L — AB (ref 14–54)
AST: 26 U/L (ref 15–41)
Albumin: 3.1 g/dL — ABNORMAL LOW (ref 3.5–5.0)
Anion gap: 11 (ref 5–15)
BUN: 26 mg/dL — AB (ref 6–20)
CHLORIDE: 104 mmol/L (ref 101–111)
CO2: 26 mmol/L (ref 22–32)
CREATININE: 0.97 mg/dL (ref 0.44–1.00)
Calcium: 9.6 mg/dL (ref 8.9–10.3)
GFR calc non Af Amer: >60 mL/min (ref >60)
Glucose, Bld: 174 mg/dL — ABNORMAL HIGH (ref 65–99)
Potassium: 5.4 mmol/L — ABNORMAL HIGH (ref 3.5–5.1)
SODIUM: 141 mmol/L (ref 135–145)
Total Bilirubin: 0.5 mg/dL (ref 0.3–1.2)
Total Protein: 7.4 g/dL (ref 6.5–8.1)

## 2017-01-19 LAB — CBC WITH DIFFERENTIAL/PLATELET
BASOS ABS: 0 10*3/uL (ref 0.0–0.1)
Basophils Relative: 0 %
EOS ABS: 0.3 10*3/uL (ref 0.0–0.7)
Eosinophils Relative: 3 %
HCT: 33 % — ABNORMAL LOW (ref 36.0–46.0)
HEMOGLOBIN: 10.5 g/dL — AB (ref 12.0–15.0)
LYMPHS ABS: 2.5 10*3/uL (ref 0.7–4.0)
LYMPHS PCT: 31 %
MCH: 29.8 pg (ref 26.0–34.0)
MCHC: 31.8 g/dL (ref 30.0–36.0)
MCV: 93.8 fL (ref 78.0–100.0)
Monocytes Absolute: 0.5 10*3/uL (ref 0.1–1.0)
Monocytes Relative: 6 %
NEUTROS PCT: 60 %
Neutro Abs: 4.9 10*3/uL (ref 1.7–7.7)
Platelets: 517 10*3/uL — ABNORMAL HIGH (ref 150–400)
RBC: 3.52 MIL/uL — AB (ref 3.87–5.11)
RDW: 14.2 % (ref 11.5–15.5)
WBC: 8.2 10*3/uL (ref 4.0–10.5)

## 2017-01-19 LAB — D-DIMER, QUANTITATIVE: D-Dimer, Quant: 3.84 ug/mL-FEU — ABNORMAL HIGH (ref 0.00–0.50)

## 2017-01-19 LAB — BRAIN NATRIURETIC PEPTIDE: B Natriuretic Peptide: 39.1 pg/mL (ref 0.0–100.0)

## 2017-01-19 LAB — LIPASE, BLOOD: Lipase: 29 U/L (ref 11–51)

## 2017-01-19 MED ORDER — IOPAMIDOL (ISOVUE-370) INJECTION 76%
100.0000 mL | Freq: Once | INTRAVENOUS | Status: AC | PRN
  Administered 2017-01-19: 100 mL via INTRAVENOUS

## 2017-01-19 NOTE — Patient Instructions (Signed)
Given exam findings and that O2 levels are dropping with any exertion, you need ER assessment. Since O2 is stable resting on RA, I am sending you via car with your husband.   You need more detailed imaging giving your chronic medical issues and symptomology.   Please go straight to the ER we discussed.

## 2017-01-19 NOTE — Consult Note (Signed)
Name: Dana [name] MRN: [id number] DOB: 27-Oct-1955    ADMISSION DATE:  01/19/2017 CONSULTATION DATE:  01/19/17  REFERRING MD :  Lita Mains  CHIEF COMPLAINT:  SOB   HISTORY OF PRESENT ILLNESS:  Dana [name] is a 61 y.o. female with a PMH as outlined below.  She had been seen by PCP on 01/16/17 due to SOB with exertion. She was felt to be due to asthma exacerbation with possible developing bronchitis versus pneumonitis.  She had not been experiencing any fevers, chills, chest pain, myalgias. Had not had any recent travel exposures to known sick contacts. She was treated with breathing treatments with improvement in her symptoms and vitals remained stable after ambulation office. She was discharged home with instructions to return or go to the ED with any worsening in her symptoms. On 9/10, she had little improvement; therefore, she went back to PCP. From PCP office, she was directed to ED for further evaluation.   In ED, she had CXR which demonstrated near complete opacification of the right hemithorax. CTA of the chest was then obtained which demonstrated very large right pleural effusion with compressive atelectasis. There was no evidence of pulmonary embolus.  She was subsequently transferred to [facility] under the hospitalist service, and PCCM was asked to see in consultation.  PAST MEDICAL HISTORY :   has a past medical history of Bipolar 1 disorder ([place]); Cellulitis and abscess of foot (03/08/2015); Diabetes mellitus; Diabetic neuropathy ([place]); Diabetic neuropathy ([place]); Erythropoietin deficiency anemia (09/20/2015); H/O hiatal hernia; Hyperlipidemia; Hypertension; Iron malabsorption (09/26/2015); Numbness and tingling; and Other iron deficiency anemias (09/20/2015).  has a past surgical history that includes Breast surgery (Left); Abdominal hysterectomy; Nasal septum surgery (1976); Amputation (10/24/2011); Tonsillectomy; Adenoidectomy; Amputation (Right, 12/16/2012); Bladder Tact  (2002); Colon  surgery; Amputation (Left, 03/09/2015); Bladder surgery; and Carpal tunnel release (Left). Prior to Admission medications   Medication Sig Start Date End Date Taking? Authorizing Provider  aspirin 325 MG EC tablet Take 325 mg by mouth daily.    [provider]  atorvastatin (LIPITOR) 20 MG tablet Take 1 tablet (20 mg total) by mouth at bedtime. 10/08/16   Brunetta Jeans, PA-C  Blood Glucose Monitoring Suppl (FREESTYLE LITE) DEVI Use to check blood sugar 3 times a day dx code E11.39 05/03/15   Elayne Snare, MD  budesonide-formoterol [facility]) 80-4.5 MCG/ACT inhaler Inhale 2 puffs into the lungs 2 (two) times daily. 01/16/17   Brunetta Jeans, PA-C  doxycycline (MONODOX) 100 MG capsule Take 100 mg by mouth. 01/16/17 01/19/17  [provider]  escitalopram (LEXAPRO) 5 MG tablet Take 5 mg by mouth 3 (three) times daily.  09/24/16   [provider]  fluticasone (FLONASE) 50 MCG/ACT nasal spray Place 2 sprays into both nostrils daily. 01/02/17   Brunetta Jeans, PA-C  FREESTYLE LITE test strip USE AS INSTRUCTED TO CHECK BLOOD SUGAR THREE TIMES DAILY 08/07/16   Elayne Snare, MD  gabapentin (NEURONTIN) 600 MG tablet TAKE 2 TABLETS BY MOUTH THREE TIMES DAILY 01/05/17   Brunetta Jeans, PA-C  insulin detemir (LEVEMIR) 100 UNIT/ML injection Inject 35 units in the skin daily at 10pm 09/02/16   Elayne Snare, MD  insulin regular (NOVOLIN R RELION) 100 units/mL injection Take 6 units with breakfast, 8 units with lunch and 8 units with dinner Patient taking differently: Inject 4-10 Units into the skin 3 (three) times daily before meals. Per meal size, small meals = less pumps to Vgo and large meal = more pumps to  Vgo. 1 pump = 2 units. 04/16/15   Elayne Snare, MD  INSULIN SYRINGE 1CC/29G 29G X 1/2" 1 ML MISC Use 1 per day. 12/26/15   Elayne Snare, MD  Lancets (FREESTYLE) lancets Use as instructed to check blood sugar 3 times per day dx code E11.39 05/03/15   Elayne Snare, MD  metFORMIN  (GLUCOPHAGE) 500 MG tablet TAKE TWO TABLETS BY MOUTH ONCE DAILY IN THE MORNING AND ONE ONCE DAILY IN THE EVENING 12/05/16   Elayne Snare, MD  OVER THE COUNTER MEDICATION Take 2 each by mouth daily. Centrum-Muli. Vitamin Gummy    [provider]  pantoprazole (PROTONIX) 40 MG tablet Take 1 tablet (40 mg total) by mouth daily. 12/16/16   Brunetta Jeans, PA-C   Allergies  Allergen Reactions  . Keflex [Cephalexin]     Tremors, rash, hard to breathe  . Lithium Nausea And Vomiting and Other (See Comments)    Other reaction(s): Other (See Comments) Can not keep this medication down. It makes her terribly ill. Can not keep this medication down. It makes her terribly ill.  . Ativan [Lorazepam] Other (See Comments)    Other reaction(s): ANAPHYLAXIS Other reaction(s): Other (See Comments) Abnormal behavior Abnormal behavior  . Demerol [Meperidine] Other (See Comments)    Abnormal behavior (sees things) Other reaction(s): ANAPHYLAXIS Other reaction(s): Other (See Comments) Abnormal behavior Abnormal behavior  . Oxycodone Other (See Comments)    Other reaction(s): OTHER Abnormal behavior  . Codeine Itching and Other (See Comments)    hallucinations  . Doxycycline Other (See Comments)    Severe muscle tremor  . Darvocet [Propoxyphene N-Acetaminophen] Itching  . Latex Itching    Other reaction(s): OTHER  . Propoxyphene Itching    FAMILY HISTORY:  family history includes Bipolar disorder in her mother; Diabetes in her maternal grandmother and mother; Hypertension in her father; Mental illness in her mother. SOCIAL HISTORY:  reports that she has never smoked. She has never used smokeless tobacco. She reports that she does not drink alcohol or use drugs.  REVIEW OF SYSTEMS:   All negative; except for those that are bolded, which indicate positives.  Constitutional: weight loss, weight gain, night sweats, fevers, chills, fatigue, weakness.  HEENT: headaches, sore throat, sneezing,  nasal congestion, post nasal drip, difficulty swallowing, tooth/dental problems, visual complaints, visual changes, ear aches. Neuro: difficulty with speech, weakness, numbness, ataxia. CV:  chest pain, orthopnea, PND, swelling in lower extremities, dizziness, palpitations, syncope.  Resp: cough, hemoptysis, dyspnea, wheezing. GI: heartburn, indigestion, abdominal pain, nausea, vomiting, diarrhea, constipation, change in bowel habits, loss of appetite, hematemesis, melena, hematochezia.  GU: dysuria, change in color of urine, urgency or frequency, flank pain, hematuria. MSK: joint pain or swelling, decreased range of motion. Psych: change in mood or affect, depression, anxiety, suicidal ideations, homicidal ideations. Skin: rash, itching, bruising.    SUBJECTIVE:  Feels breathing is stable.  Feels better when right lateral decub position.  VITAL SIGNS: Temp:  [97.7 F (36.5 C)-98.5 F (36.9 C)] 98.5 F (36.9 C) (09/10 1700) Pulse Rate:  [83-88] 88 (09/10 1958) Resp:  [14-20] 20 (09/10 1958) BP: (120-164)/(72-95) 135/93 (09/10 1958) SpO2:  [88 %-97 %] 97 % (09/10 1958) Weight:  [74.4 kg (164 lb)-81.6 kg (180 lb)] 74.4 kg (164 lb) (09/10 1701)  PHYSICAL EXAMINATION: General: Adult female, no distress. Neuro: A&O x 3, no deficits. HEENT: [place] / AT. MMM. Cardiovascular: RRR, no M/R/G. Lungs: Clear on left, diminished throughout right. Abdomen: BS x 4, S/NT/ND. Musculoskeletal: No deformities, no  edema. Skin: Warm, dry.   Recent Labs Lab 01/19/17 1725  NA 141  K 5.4*  CL 104  CO2 26  BUN 26*  CREATININE 0.97  GLUCOSE 174*    Recent Labs Lab 01/19/17 1725  HGB 10.5*  HCT 33.0*  WBC 8.2  PLT 517*   Dg Chest 2 View  Result Date: 01/19/2017 CLINICAL DATA:  Worsening shortness of breath for 2 weeks. EXAM: CHEST  2 VIEW COMPARISON:  02/03/2016. FINDINGS: Near complete opacification of the right hemithorax. Small amount of aerated right upper lobe is noted. The left lung  is clear. The cardiac silhouette, mediastinal and hilar contours are within normal limits and stable. There is mild tortuosity and calcification of the thoracic aorta. The bony structures are unremarkable. IMPRESSION: Near complete opacification of the right hemithorax could be due to a large pleural effusion or bronchial obstruction with drowned/obstructed lung. Recommend CT with contrast for further evaluation. Electronically Signed   By: Marijo Sanes M.D.   On: 01/19/2017 17:54   Ct Angio Chest Pe W And/or Wo Contrast  Result Date: 01/19/2017 CLINICAL DATA:  Shortness of Breath EXAM: CT ANGIOGRAPHY CHEST WITH CONTRAST TECHNIQUE: Multidetector CT imaging of the chest was performed using the standard protocol during bolus administration of intravenous contrast. Multiplanar CT image reconstructions and MIPs were obtained to evaluate the vascular anatomy. CONTRAST:  100 cc Isovue 370 IV COMPARISON:  Chest x-ray earlier today FINDINGS: Cardiovascular: Heart is normal size. Aorta is normal caliber. No filling defects in the pulmonary arteries to suggest pulmonary emboli. Mediastinum/Nodes: No mediastinal, hilar, or axillary adenopathy. Lungs/Pleura: Large right pleural effusion with near complete opacification of the right hemithorax. Compressive atelectasis throughout much of the right lung. Shift of mediastinal structures to the left. No confluent opacity or effusion on the left. Upper Abdomen: Imaging into the upper abdomen shows no acute findings. Musculoskeletal: Chest wall soft tissues are unremarkable. Is no acute bony abnormality. Review of the MIP images confirms the above findings. IMPRESSION: Very large right pleural effusion with near complete opacification of the right hemithorax and compressive atelectasis throughout much of the right lung. Shift of the mediastinal structures to the left. No evidence of pulmonary embolus. Electronically Signed   By: Rolm Baptise M.D.   On: 01/19/2017 19:05     STUDIES:  CXR 9/10 > near complete opacification of the right hemithorax. CTA chest 9/10 > very large right pleural effusion with compressive atelectasis. There was no evidence of pulmonary embolus.  SIGNIFICANT EVENTS  9/10 > admitted.  ASSESSMENT / PLAN:  Large right pleural effusion - unclear etiology at this point; though suspect transudate.  No history to support infectious process, no hx malignancy, no hx tobacco use. Acute hypoxic respiratory failure - due to above. Atelectasis - due to above. Plan: Assess pleural space under US guidance and plan for diagnostic and therapeutic thoracentesis in AM - send for routine studies. Continue supplemental O2 as needed to maintain SpO2 > 92%. Albuterol PRN. Bronchial hygiene with incentive spirometry. Mobilize as able. Assess echo.   Rest per primary team.   Montey Hora, [place] [facility] Pager: [phone]  or [phone] 01/20/2017, 6:03 AM

## 2017-01-19 NOTE — Plan of Care (Signed)
Dana [name] is a 61 year old female with HTN, HLD, IDDM; who presented with complaints of progressively worsening shortness of breath last few weeks. Initial O2 sats is low as 88% on room air, and therefore placed on 2 L nasal cannula oxygen with significant improvement of O2 sats. Labs revealed WBC 8.2, hemoglobin 10.5, platelets 517, potassium 5.4, BUN 26, creatinine 0.97,  glucose 174, and BNP 39.1. Chest x-ray showing near complete opacification of the right hemithorax. CT angiogram obtained showing similar. [facility] PCCM consulted by ED physician and will see upon arrival. Admitting to a medsurg bed.

## 2017-01-19 NOTE — Progress Notes (Signed)
Pre visit review using our clinic review tool, if applicable. No additional management support is needed unless otherwise documented below in the visit note. 

## 2017-01-19 NOTE — ED Triage Notes (Signed)
Pt sent here from [facility] in [place] for evaluation of sob.  Pt states she has been sob for over 2 weeks.  Sob worsening with exertion.

## 2017-01-19 NOTE — Progress Notes (Signed)
Patient presents to clinic today for repeat assessment. Patient was seen on Friday (01/16/17). At that time patient started on Symbicort to help with reactive airway while a STAT CXR was to be obtained. Patient never went for imaging. Is using Symbicort as directed and following supportive instructions. Is on Doxycycline, given by her Ortho specialist prior to appointment with this provider on Friday. Has been taking as directed. Denies decrease in wheezing and decrease in SOBOE. Denies any new or worsening symptoms.   Past Medical History:  Diagnosis Date  . Bipolar 1 disorder ([place])   . Cellulitis and abscess of foot 03/08/2015  . Diabetes mellitus    INSULIN DEPENDENT  . Diabetic neuropathy ([place])   . Diabetic neuropathy ([place])   . 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 H/O hiatal hernia   . Hyperlipidemia   . Hypertension    past hx of  . Iron malabsorption 09/26/2015  . Numbness and tingling    Hx; of in B/LLE and B/LUE  . Other iron deficiency anemias 09/20/2015    Current Outpatient Prescriptions on File Prior to Visit  Medication Sig Dispense Refill  . aspirin 325 MG EC tablet Take 325 mg by mouth daily.    Marland Kitchen atorvastatin (LIPITOR) 20 MG tablet Take 1 tablet (20 mg total) by mouth at bedtime. 30 tablet 5  . Blood Glucose Monitoring Suppl (FREESTYLE LITE) DEVI Use to check blood sugar 3 times a day dx code E11.39 1 each 0  . budesonide-formoterol (SYMBICORT) 80-4.5 MCG/ACT inhaler Inhale 2 puffs into the lungs 2 (two) times daily. 1 Inhaler 3  . doxycycline (MONODOX) 100 MG capsule Take 100 mg by mouth.    . escitalopram (LEXAPRO) 5 MG tablet Take 5 mg by mouth 3 (three) times daily.     . fluticasone (FLONASE) 50 MCG/ACT nasal spray Place 2 sprays into both nostrils daily. 16 g 0  . FREESTYLE LITE test strip USE AS INSTRUCTED TO CHECK BLOOD SUGAR THREE TIMES DAILY 100 each 3  . gabapentin (NEURONTIN) 600 MG tablet TAKE 2 TABLETS BY MOUTH THREE TIMES DAILY 540 tablet 1  .  insulin detemir (LEVEMIR) 100 UNIT/ML injection Inject 35 units in the skin daily at 10pm 20 mL 4  . insulin regular (NOVOLIN R RELION) 100 units/mL injection Take 6 units with breakfast, 8 units with lunch and 8 units with dinner (Patient taking differently: Inject 4-10 Units into the skin 3 (three) times daily before meals. Per meal size, small meals = less pumps to Vgo and large meal = more pumps to Vgo. 1 pump = 2 units.) 10 mL 3  . INSULIN SYRINGE 1CC/29G 29G X 1/2" 1 ML MISC Use 1 per day. 100 each 2  . Lancets (FREESTYLE) lancets Use as instructed to check blood sugar 3 times per day dx code E11.39 100 each 3  . metFORMIN (GLUCOPHAGE) 500 MG tablet TAKE TWO TABLETS BY MOUTH ONCE DAILY IN THE MORNING AND ONE ONCE DAILY IN THE EVENING 90 tablet 5  . OVER THE COUNTER MEDICATION Take 2 each by mouth daily. Centrum-Muli. Vitamin Gummy    . pantoprazole (PROTONIX) 40 MG tablet Take 1 tablet (40 mg total) by mouth daily. 30 tablet 3   No current facility-administered medications on file prior to visit.     Allergies  Allergen Reactions  . Keflex [Cephalexin]     Tremors, rash, hard to breathe  . Lithium Nausea And Vomiting and Other (See Comments)    Other reaction(s): Other (See  Comments) Can not keep this medication down. It makes her terribly ill. Can not keep this medication down. It makes her terribly ill.  . Ativan [Lorazepam] Other (See Comments)    Other reaction(s): ANAPHYLAXIS Other reaction(s): Other (See Comments) Abnormal behavior Abnormal behavior  . Demerol [Meperidine] Other (See Comments)    Abnormal behavior (sees things) Other reaction(s): ANAPHYLAXIS Other reaction(s): Other (See Comments) Abnormal behavior Abnormal behavior  . Oxycodone Other (See Comments)    Other reaction(s): OTHER Abnormal behavior  . Codeine Itching and Other (See Comments)    hallucinations  . Doxycycline Other (See Comments)    Severe muscle tremor  . Darvocet [Propoxyphene  N-Acetaminophen] Itching  . Latex Itching    Other reaction(s): OTHER  . Propoxyphene Itching    Family History  Problem Relation Age of Onset  . Diabetes Mother   . Mental illness Mother   . Bipolar disorder Mother   . Hypertension Father   . Diabetes Maternal Grandmother   . Heart disease Neg Hx     Social History   Social History  . Marital status: Married    Spouse name: N/A  . Number of children: 2  . Years of education: 14   Occupational History  .      disabled   Social History Main Topics  . Smoking status: Never Smoker  . Smokeless tobacco: Never Used  . Alcohol use No  . Drug use: No  . Sexual activity: No   Other Topics Concern  . None   Social History Narrative   Lives with husband in home   Caffeine use - Coke 2 or 3 16 oz daily   Review of Systems - See HPI.  All other ROS are negative.  BP 120/72   Pulse 83   Temp 97.7 F (36.5 C) (Oral)   Resp 14   Ht '5\' 4"'  (1.626 m)   Wt 180 lb (81.6 kg)   SpO2 91%   BMI 30.90 kg/m   Physical Exam  Constitutional: She is oriented to person, place, and time and well-developed, well-nourished, and in no distress.  HENT:  Head: Normocephalic and atraumatic.  Eyes: Conjunctivae are normal.  Neck: Neck supple.  Cardiovascular: Normal rate, regular rhythm, normal heart sounds and intact distal pulses.   Pulmonary/Chest: No accessory muscle usage. No respiratory distress. She has decreased breath sounds in the right upper field, the right middle field and the right lower field. She has no wheezes. She has no rhonchi. She has no rales.  Neurological: She is alert and oriented to person, place, and time.  Skin: Skin is warm and dry. No rash noted.  Psychiatric: Affect normal.  Vitals reviewed.  Recent Results (from the past 2160 hour(s))  Basic metabolic panel     Status: Abnormal   Collection Time: 10/22/16  1:36 PM  Result Value Ref Range   Sodium 142 135 - 145 mEq/L   Potassium 5.2 (H) 3.5 - 5.1  mEq/L   Chloride 103 96 - 112 mEq/L   CO2 29 19 - 32 mEq/L   Glucose, Bld 101 (H) 70 - 99 mg/dL   BUN 31 (H) 6 - 23 mg/dL   Creatinine, Ser 0.99 0.40 - 1.20 mg/dL   Calcium 9.5 8.4 - 10.5 mg/dL   GFR 60.86 >60.00 mL/min  Comprehensive metabolic panel     Status: Abnormal   Collection Time: 12/02/16 10:57 AM  Result Value Ref Range   Sodium 140 135 - 145 mEq/L  Potassium 5.6 (H) 3.5 - 5.1 mEq/L   Chloride 103 96 - 112 mEq/L   CO2 30 19 - 32 mEq/L   Glucose, Bld 117 (H) 70 - 99 mg/dL   BUN 25 (H) 6 - 23 mg/dL   Creatinine, Ser 0.80 0.40 - 1.20 mg/dL   Total Bilirubin 0.2 0.2 - 1.2 mg/dL   Alkaline Phosphatase 115 39 - 117 U/L   AST 14 0 - 37 U/L   ALT 20 0 - 35 U/L   Total Protein 7.3 6.0 - 8.3 g/dL   Albumin 3.2 (L) 3.5 - 5.2 g/dL   Calcium 9.9 8.4 - 10.5 mg/dL   GFR 77.80 >60.00 mL/min  Hemoglobin A1c     Status: Abnormal   Collection Time: 12/02/16 10:57 AM  Result Value Ref Range   Hgb A1c MFr Bld 7.1 (H) 4.6 - 6.5 %    Comment: Glycemic Control Guidelines for People with Diabetes:Non Diabetic:  <6%Goal of Therapy: <7%Additional Action Suggested:  >8%   CBC w/Diff     Status: Abnormal   Collection Time: 12/18/16 11:56 AM  Result Value Ref Range   WBC 7.2 3.9 - 10.0 10e3/uL   RBC 3.27 (L) 3.70 - 5.32 10e6/uL   HGB 10.1 (L) 11.6 - 15.9 g/dL   HCT 31.4 (L) 34.8 - 46.6 %   MCV 96 81 - 101 fL   MCH 30.9 26.0 - 34.0 pg   MCHC 32.2 32.0 - 36.0 g/dL   RDW 13.0 11.1 - 15.7 %   Platelets 418 (H) 145 - 400 10e3/uL   NEUT# 4.3 1.5 - 6.5 10e3/uL   LYMPH# 2.1 0.9 - 3.3 10e3/uL   MONO# 0.4 0.1 - 0.9 10e3/uL   Eosinophils Absolute 0.3 0.0 - 0.5 10e3/uL   BASO# 0.0 0.0 - 0.2 10e3/uL   NEUT% 60.2 39.6 - 80.0 %   LYMPH% 29.7 14.0 - 48.0 %   MONO% 6.0 0.0 - 13.0 %   EOS% 3.8 0.0 - 7.0 %   BASO% 0.3 0.0 - 2.0 %  CMP     Status: Abnormal   Collection Time: 12/18/16 11:56 AM  Result Value Ref Range   Sodium 140 136 - 145 mEq/L   Potassium 4.9 3.5 - 5.1 mEq/L   Chloride 103 98 -  109 mEq/L   CO2 28 22 - 29 mEq/L   Glucose 138 70 - 140 mg/dl    Comment: Glucose reference range is for nonfasting patients. Fasting glucose reference range is 70- 100.   BUN 19.9 7.0 - 26.0 mg/dL   Creatinine 0.8 0.6 - 1.1 mg/dL   Total Bilirubin 0.39 0.20 - 1.20 mg/dL   Alkaline Phosphatase 213 (H) 40 - 150 U/L   AST 14 5 - 34 U/L   ALT 19 0 - 55 U/L   Total Protein 7.5 6.4 - 8.3 g/dL   Albumin 2.8 (L) 3.5 - 5.0 g/dL   Calcium 9.8 8.4 - 10.4 mg/dL   Anion Gap 9 3 - 11 mEq/L   EGFR 77 (L) >90 ml/min/1.73 m2    Comment: eGFR is calculated using the CKD-EPI Creatinine Equation (2009)  Ferritin     Status: Abnormal   Collection Time: 12/18/16 11:56 AM  Result Value Ref Range   Ferritin 791 (H) 9 - 269 ng/ml  Iron and TIBC     Status: Abnormal   Collection Time: 12/18/16 11:56 AM  Result Value Ref Range   Iron 46 41 - 142 ug/dL   TIBC 250 236 -  444 ug/dL   UIBC 204 120 - 384 ug/dL   %SAT 18 (L) 21 - 57 %  Reticulocytes     Status: Abnormal   Collection Time: 12/18/16 11:56 AM  Result Value Ref Range   Reticulocyte Count 2.7 (H) 0.6 - 2.6 %    Assessment/Plan: 1. SOBOE (shortness of breath on exertion) 2. Decreased breath sounds R lung field. O2 94-96% on room air. Desaturates quickly with exertion -- down to 87%. Needs urgent assessment. Concern for significant pneumonia or pleural effusion. ER disposition given. Refuses ER transport. Husband is driving her to the ER for urgent assessment.    [name], PA-C

## 2017-01-19 NOTE — ED Provider Notes (Signed)
[facility] DEPT [facility] Provider Note   CSN: [id number] Arrival date & time: 01/19/17  1651     History   Chief Complaint Chief Complaint  Patient presents with  . Shortness of Breath    referred by Elyn Aquas for evaluation    HPI Marjo B [place] is a 61 y.o. female.  HPI Patient with progressive shortness of breath over the past 2 weeks. Worse with exertion and lying flat. Mild nonproductive cough. No new lower extremity swelling or pain. No fever or chills. Seen by her primary physician and referred to the emergency department. She denies any chest pain.  Past Medical History:  Diagnosis Date  . Bipolar 1 disorder ([place])   . Cellulitis and abscess of foot 03/08/2015  . Diabetes mellitus    INSULIN DEPENDENT  . Diabetic neuropathy ([place])   . Diabetic neuropathy ([place])   . 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 H/O hiatal hernia   . Hyperlipidemia   . Hypertension    past hx of  . Iron malabsorption 09/26/2015  . Numbness and tingling    Hx; of in B/LLE and B/LUE  . Other iron deficiency anemias 09/20/2015    Patient Active Problem List   Diagnosis Date Noted  . Pleural effusion 01/19/2017  . Contracture of left Achilles tendon 01/16/2017  . Allergic rhinitis 01/03/2017  . Gastroesophageal reflux disease without esophagitis 01/03/2017  . Hiatal hernia 02/03/2016  . Anemia associated with diabetes mellitus ([place]) 11/07/2015  . Iron malabsorption 09/26/2015  . Other iron deficiency anemia 09/20/2015  . 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 Diabetic retinopathy associated with diabetes mellitus due to underlying condition ([place]) 03/15/2015  . DM (diabetes mellitus) type II uncontrolled with eye manifestation ([place]) 11/08/2014  . Hyperlipidemia LDL goal <100 11/08/2014  . Diabetic polyneuropathy associated with type 2 diabetes mellitus ([place]) 11/08/2014  . Partial nontraumatic amputation of foot ([place]) 11/08/2014  . Bipolar 1 disorder ([place]) 10/24/2011     Past Surgical History:  Procedure Laterality Date  . ABDOMINAL HYSTERECTOMY    . ADENOIDECTOMY     Hx: of  . AMPUTATION  10/24/2011   Procedure: AMPUTATION RAY;  Surgeon: Newt Minion, MD;  Location: [facility];  Service: Orthopedics;  Laterality: Right;  Right Foot 3rd Ray Amputation  . AMPUTATION Right 12/16/2012   Procedure: Right Foot Transmetatarsal Amputation;  Surgeon: Newt Minion, MD;  Location: [facility];  Service: Orthopedics;  Laterality: Right;  . AMPUTATION Left 03/09/2015   Procedure: LEFT FOOT 1ST RAY AMPUTATION;  Surgeon: Newt Minion, MD;  Location: [facility];  Service: Orthopedics;  Laterality: Left;  . BLADDER SURGERY     x 2, tacked 1st time; mesh "eroded", had to be removed  . Bladder Tact   2002  . BREAST SURGERY Left    "knot removed"  . CARPAL TUNNEL RELEASE Left   . COLON SURGERY    . NASAL SEPTUM SURGERY  1976  . TONSILLECTOMY     age 48's    OB History    No data available       Home Medications    Prior to Admission medications   Medication Sig Start Date End Date Taking? Authorizing Provider  aspirin 325 MG EC tablet Take 325 mg by mouth daily.    [provider]  atorvastatin (LIPITOR) 20 MG tablet Take 1 tablet (20 mg total) by mouth at bedtime. 10/08/16   Brunetta Jeans, PA-C  Blood Glucose Monitoring Suppl (FREESTYLE LITE) DEVI Use to  check blood sugar 3 times a day dx code E11.39 05/03/15   Elayne Snare, MD  budesonide-formoterol [facility]) 80-4.5 MCG/ACT inhaler Inhale 2 puffs into the lungs 2 (two) times daily. 01/16/17   Brunetta Jeans, PA-C  doxycycline (MONODOX) 100 MG capsule Take 100 mg by mouth. 01/16/17 01/19/17  [provider]  escitalopram (LEXAPRO) 5 MG tablet Take 5 mg by mouth 3 (three) times daily.  09/24/16   [provider]  fluticasone (FLONASE) 50 MCG/ACT nasal spray Place 2 sprays into both nostrils daily. 01/02/17   Brunetta Jeans, PA-C  FREESTYLE LITE test strip USE AS INSTRUCTED TO CHECK BLOOD  SUGAR THREE TIMES DAILY 08/07/16   Elayne Snare, MD  gabapentin (NEURONTIN) 600 MG tablet TAKE 2 TABLETS BY MOUTH THREE TIMES DAILY 01/05/17   Brunetta Jeans, PA-C  insulin detemir (LEVEMIR) 100 UNIT/ML injection Inject 35 units in the skin daily at 10pm 09/02/16   Elayne Snare, MD  insulin regular (NOVOLIN R RELION) 100 units/mL injection Take 6 units with breakfast, 8 units with lunch and 8 units with dinner Patient taking differently: Inject 4-10 Units into the skin 3 (three) times daily before meals. Per meal size, small meals = less pumps to Vgo and large meal = more pumps to Vgo. 1 pump = 2 units. 04/16/15   Elayne Snare, MD  INSULIN SYRINGE 1CC/29G 29G X 1/2" 1 ML MISC Use 1 per day. 12/26/15   Elayne Snare, MD  Lancets (FREESTYLE) lancets Use as instructed to check blood sugar 3 times per day dx code E11.39 05/03/15   Elayne Snare, MD  metFORMIN (GLUCOPHAGE) 500 MG tablet TAKE TWO TABLETS BY MOUTH ONCE DAILY IN THE MORNING AND ONE ONCE DAILY IN THE EVENING 12/05/16   Elayne Snare, MD  OVER THE COUNTER MEDICATION Take 2 each by mouth daily. Centrum-Muli. Vitamin Gummy    [provider]  pantoprazole (PROTONIX) 40 MG tablet Take 1 tablet (40 mg total) by mouth daily. 12/16/16   Brunetta Jeans, PA-C    Family History Family History  Problem Relation Age of Onset  . Diabetes Mother   . Mental illness Mother   . Bipolar disorder Mother   . Hypertension Father   . Diabetes Maternal Grandmother   . Heart disease Neg Hx     Social History Social History  Substance Use Topics  . Smoking status: Never Smoker  . Smokeless tobacco: Never Used  . Alcohol use No     Allergies   Keflex [cephalexin]; Lithium; Ativan [lorazepam]; Demerol [meperidine]; Oxycodone; Codeine; Doxycycline; Darvocet [propoxyphene n-acetaminophen]; Latex; and Propoxyphene   Review of Systems Review of Systems  Constitutional: Negative for chills and fever.  HENT: Negative for trouble swallowing.    Respiratory: Positive for cough and shortness of breath.   Cardiovascular: Negative for chest pain, palpitations and leg swelling.  Gastrointestinal: Negative for abdominal pain, constipation, diarrhea, nausea and vomiting.  Genitourinary: Negative for dysuria, flank pain and frequency.  Musculoskeletal: Negative for back pain, myalgias, neck pain and neck stiffness.  Skin: Negative for rash and wound.  Neurological: Negative for dizziness, syncope, weakness, light-headedness, numbness and headaches.  All other systems reviewed and are negative.    Physical Exam Updated Vital Signs BP (!) 135/93 (BP Location: Right Arm)   Pulse 88   Temp 98.5 F (36.9 C)   Resp 20   Ht 5\' 1"  (1.549 m)   Wt 74.4 kg (164 lb)   SpO2 97%   BMI 30.99 kg/m  Physical Exam  Constitutional: She is oriented to person, place, and time. She appears well-developed and well-nourished. No distress.  HENT:  Head: Normocephalic and atraumatic.  Mouth/Throat: Oropharynx is clear and moist.  Eyes: Pupils are equal, round, and reactive to light. EOM are normal.  Neck: Normal range of motion. Neck supple.  Cardiovascular: Normal rate and regular rhythm.  Exam reveals no gallop and no friction rub.   No murmur heard. Pulmonary/Chest: Effort normal.  Diminished breath sounds throughout the right lung field  Abdominal: Soft. Bowel sounds are normal. There is no tenderness. There is no rebound and no guarding.  Musculoskeletal: Normal range of motion. She exhibits no edema or tenderness.  Left lower extremity is in a short leg cast. Right lower extremity with below-the-knee amputation.  Neurological: She is alert and oriented to person, place, and time.  Moving all extremities without deficit. Sensation intact.  Skin: Skin is warm and dry. Capillary refill takes less than 2 seconds. No rash noted. No erythema.  Psychiatric: She has a normal mood and affect. Her behavior is normal.  Nursing note and vitals  reviewed.    ED Treatments / Results  Labs (all labs ordered are listed, but only abnormal results are displayed) Labs Reviewed  CBC WITH DIFFERENTIAL/PLATELET - Abnormal; Notable for the following:       Result Value   RBC 3.52 (*)    Hemoglobin 10.5 (*)    HCT 33.0 (*)    Platelets 517 (*)    All other components within normal limits  COMPREHENSIVE METABOLIC PANEL - Abnormal; Notable for the following:    Potassium 5.4 (*)    Glucose, Bld 174 (*)    BUN 26 (*)    Albumin 3.1 (*)    ALT 64 (*)    Alkaline Phosphatase 370 (*)    All other components within normal limits  D-DIMER, QUANTITATIVE (NOT AT [facility]) - Abnormal; Notable for the following:    D-Dimer, Quant 3.84 (*)    All other components within normal limits  TROPONIN I  LIPASE, BLOOD  BRAIN NATRIURETIC PEPTIDE    EKG  EKG Interpretation None       Radiology Dg Chest 2 View  Result Date: 01/19/2017 CLINICAL DATA:  Worsening shortness of breath for 2 weeks. EXAM: CHEST  2 VIEW COMPARISON:  02/03/2016. FINDINGS: Near complete opacification of the right hemithorax. Small amount of aerated right upper lobe is noted. The left lung is clear. The cardiac silhouette, mediastinal and hilar contours are within normal limits and stable. There is mild tortuosity and calcification of the thoracic aorta. The bony structures are unremarkable. IMPRESSION: Near complete opacification of the right hemithorax could be due to a large pleural effusion or bronchial obstruction with drowned/obstructed lung. Recommend CT with contrast for further evaluation. Electronically Signed   By: Marijo Sanes M.D.   On: 01/19/2017 17:54   Ct Angio Chest Pe W And/or Wo Contrast  Result Date: 01/19/2017 CLINICAL DATA:  Shortness of Breath EXAM: CT ANGIOGRAPHY CHEST WITH CONTRAST TECHNIQUE: Multidetector CT imaging of the chest was performed using the standard protocol during bolus administration of intravenous contrast. Multiplanar CT image  reconstructions and MIPs were obtained to evaluate the vascular anatomy. CONTRAST:  100 cc Isovue 370 IV COMPARISON:  Chest x-ray earlier today FINDINGS: Cardiovascular: Heart is normal size. Aorta is normal caliber. No filling defects in the pulmonary arteries to suggest pulmonary emboli. Mediastinum/Nodes: No mediastinal, hilar, or axillary adenopathy. Lungs/Pleura: Large right pleural effusion with near  complete opacification of the right hemithorax. Compressive atelectasis throughout much of the right lung. Shift of mediastinal structures to the left. No confluent opacity or effusion on the left. Upper Abdomen: Imaging into the upper abdomen shows no acute findings. Musculoskeletal: Chest wall soft tissues are unremarkable. Is no acute bony abnormality. Review of the MIP images confirms the above findings. IMPRESSION: Very large right pleural effusion with near complete opacification of the right hemithorax and compressive atelectasis throughout much of the right lung. Shift of the mediastinal structures to the left. No evidence of pulmonary embolus. Electronically Signed   By: Rolm Baptise M.D.   On: 01/19/2017 19:05    Procedures Procedures (including critical care time)  Medications Ordered in ED Medications  iopamidol (ISOVUE-370) 76 % injection 100 mL (100 mLs Intravenous Contrast Given 01/19/17 1838)     Initial Impression / Assessment and Plan / ED Course  I have reviewed the triage vital signs and the nursing notes.  Pertinent labs & imaging results that were available during my care of the patient were reviewed by me and considered in my medical decision making (see chart for details).    Discussed with critical care, Dr Elsworth Soho. Will consult on the patient. Dr. Tamala Julian will accept patient in transfer to [facility].  Final Clinical Impressions(s) / ED Diagnoses   Final diagnoses:  Pleural effusion on right    New Prescriptions New Prescriptions   No medications on file      Julianne Rice, MD 01/19/17 2205

## 2017-01-19 NOTE — ED Notes (Signed)
Patient transported to CT 

## 2017-01-20 ENCOUNTER — Observation Stay (HOSPITAL_COMMUNITY): Payer: PPO

## 2017-01-20 ENCOUNTER — Encounter (HOSPITAL_COMMUNITY): Payer: Self-pay | Admitting: Family Medicine

## 2017-01-20 ENCOUNTER — Observation Stay (HOSPITAL_BASED_OUTPATIENT_CLINIC_OR_DEPARTMENT_OTHER): Payer: PPO

## 2017-01-20 DIAGNOSIS — I340 Nonrheumatic mitral (valve) insufficiency: Secondary | ICD-10-CM | POA: Diagnosis not present

## 2017-01-20 DIAGNOSIS — Z79899 Other long term (current) drug therapy: Secondary | ICD-10-CM | POA: Diagnosis not present

## 2017-01-20 DIAGNOSIS — E1142 Type 2 diabetes mellitus with diabetic polyneuropathy: Secondary | ICD-10-CM | POA: Diagnosis present

## 2017-01-20 DIAGNOSIS — Z818 Family history of other mental and behavioral disorders: Secondary | ICD-10-CM | POA: Diagnosis not present

## 2017-01-20 DIAGNOSIS — Z8249 Family history of ischemic heart disease and other diseases of the circulatory system: Secondary | ICD-10-CM | POA: Diagnosis not present

## 2017-01-20 DIAGNOSIS — F319 Bipolar disorder, unspecified: Secondary | ICD-10-CM | POA: Diagnosis present

## 2017-01-20 DIAGNOSIS — Z90710 Acquired absence of both cervix and uterus: Secondary | ICD-10-CM | POA: Diagnosis not present

## 2017-01-20 DIAGNOSIS — E669 Obesity, unspecified: Secondary | ICD-10-CM | POA: Diagnosis present

## 2017-01-20 DIAGNOSIS — J9601 Acute respiratory failure with hypoxia: Secondary | ICD-10-CM | POA: Diagnosis present

## 2017-01-20 DIAGNOSIS — J948 Other specified pleural conditions: Secondary | ICD-10-CM | POA: Diagnosis not present

## 2017-01-20 DIAGNOSIS — Z23 Encounter for immunization: Secondary | ICD-10-CM | POA: Diagnosis present

## 2017-01-20 DIAGNOSIS — Z7982 Long term (current) use of aspirin: Secondary | ICD-10-CM | POA: Diagnosis not present

## 2017-01-20 DIAGNOSIS — E1169 Type 2 diabetes mellitus with other specified complication: Secondary | ICD-10-CM | POA: Diagnosis not present

## 2017-01-20 DIAGNOSIS — Z7951 Long term (current) use of inhaled steroids: Secondary | ICD-10-CM | POA: Diagnosis not present

## 2017-01-20 DIAGNOSIS — Z993 Dependence on wheelchair: Secondary | ICD-10-CM | POA: Diagnosis not present

## 2017-01-20 DIAGNOSIS — Z833 Family history of diabetes mellitus: Secondary | ICD-10-CM | POA: Diagnosis not present

## 2017-01-20 DIAGNOSIS — E875 Hyperkalemia: Secondary | ICD-10-CM | POA: Diagnosis present

## 2017-01-20 DIAGNOSIS — R0602 Shortness of breath: Secondary | ICD-10-CM | POA: Diagnosis present

## 2017-01-20 DIAGNOSIS — E785 Hyperlipidemia, unspecified: Secondary | ICD-10-CM | POA: Diagnosis present

## 2017-01-20 DIAGNOSIS — J90 Pleural effusion, not elsewhere classified: Secondary | ICD-10-CM | POA: Diagnosis present

## 2017-01-20 DIAGNOSIS — J189 Pneumonia, unspecified organism: Secondary | ICD-10-CM | POA: Diagnosis present

## 2017-01-20 DIAGNOSIS — Z794 Long term (current) use of insulin: Secondary | ICD-10-CM | POA: Diagnosis not present

## 2017-01-20 DIAGNOSIS — J181 Lobar pneumonia, unspecified organism: Secondary | ICD-10-CM | POA: Diagnosis not present

## 2017-01-20 DIAGNOSIS — J9811 Atelectasis: Secondary | ICD-10-CM | POA: Diagnosis not present

## 2017-01-20 DIAGNOSIS — Z6833 Body mass index (BMI) 33.0-33.9, adult: Secondary | ICD-10-CM | POA: Diagnosis not present

## 2017-01-20 DIAGNOSIS — D638 Anemia in other chronic diseases classified elsewhere: Secondary | ICD-10-CM | POA: Diagnosis present

## 2017-01-20 DIAGNOSIS — R188 Other ascites: Secondary | ICD-10-CM | POA: Diagnosis not present

## 2017-01-20 DIAGNOSIS — Z89511 Acquired absence of right leg below knee: Secondary | ICD-10-CM | POA: Diagnosis not present

## 2017-01-20 DIAGNOSIS — I10 Essential (primary) hypertension: Secondary | ICD-10-CM | POA: Diagnosis present

## 2017-01-20 LAB — COMPREHENSIVE METABOLIC PANEL
ALBUMIN: 2.7 g/dL — AB (ref 3.5–5.0)
ALT: 46 U/L (ref 14–54)
ANION GAP: 8 (ref 5–15)
AST: 17 U/L (ref 15–41)
Alkaline Phosphatase: 289 U/L — ABNORMAL HIGH (ref 38–126)
BUN: 22 mg/dL — ABNORMAL HIGH (ref 6–20)
CO2: 28 mmol/L (ref 22–32)
Calcium: 9 mg/dL (ref 8.9–10.3)
Chloride: 102 mmol/L (ref 101–111)
Creatinine, Ser: 0.93 mg/dL (ref 0.44–1.00)
GFR calc non Af Amer: >60 mL/min (ref >60)
GLUCOSE: 219 mg/dL — AB (ref 65–99)
POTASSIUM: 4.1 mmol/L (ref 3.5–5.1)
SODIUM: 138 mmol/L (ref 135–145)
Total Bilirubin: 0.4 mg/dL (ref 0.3–1.2)
Total Protein: 6.6 g/dL (ref 6.5–8.1)

## 2017-01-20 LAB — PROTEIN, PLEURAL OR PERITONEAL FLUID: Total protein, fluid: 5.3 g/dL

## 2017-01-20 LAB — GLUCOSE, CAPILLARY
GLUCOSE-CAPILLARY: 205 mg/dL — AB (ref 65–99)
GLUCOSE-CAPILLARY: 143 mg/dL — AB (ref 65–99)
Glucose-Capillary: 146 mg/dL — ABNORMAL HIGH (ref 65–99)
Glucose-Capillary: 189 mg/dL — ABNORMAL HIGH (ref 65–99)
Glucose-Capillary: 151 mg/dL — ABNORMAL HIGH (ref 65–99)

## 2017-01-20 LAB — LACTATE DEHYDROGENASE
LDH: 155 U/L (ref 98–192)
LDH: 180 U/L (ref 98–192)

## 2017-01-20 LAB — PROTIME-INR
INR: 1.04
Prothrombin Time: 13.5 seconds (ref 11.4–15.2)

## 2017-01-20 LAB — BODY FLUID CELL COUNT WITH DIFFERENTIAL
LYMPHS FL: 61 %
MONOCYTE-MACROPHAGE-SEROUS FLUID: 36 % — AB (ref 50–90)
Neutrophil Count, Fluid: 3 % (ref 0–25)
Total Nucleated Cell Count, Fluid: 215 cu mm (ref 0–1000)

## 2017-01-20 LAB — PROTEIN, TOTAL: TOTAL PROTEIN: 6.7 g/dL (ref 6.5–8.1)

## 2017-01-20 LAB — APTT: APTT: 30 s (ref 24–36)

## 2017-01-20 LAB — LACTATE DEHYDROGENASE, PLEURAL OR PERITONEAL FLUID: LD, Fluid: 117 U/L — ABNORMAL HIGH (ref 3–23)

## 2017-01-20 LAB — CHOLESTEROL, TOTAL: CHOLESTEROL: 128 mg/dL (ref 0–200)

## 2017-01-20 LAB — ECHOCARDIOGRAM COMPLETE
Height: 61 in
Weight: 2857.16 oz

## 2017-01-20 MED ORDER — GABAPENTIN 600 MG PO TABS
1200.0000 mg | ORAL_TABLET | Freq: Three times a day (TID) | ORAL | Status: DC
  Administered 2017-01-20 – 2017-01-23 (×10): 1200 mg via ORAL
  Filled 2017-01-20 (×11): qty 2

## 2017-01-20 MED ORDER — INSULIN ASPART 100 UNIT/ML ~~LOC~~ SOLN: 0.0000 [IU] | Freq: Every day | SUBCUTANEOUS | Status: DC

## 2017-01-20 MED ORDER — ONDANSETRON HCL 4 MG/2ML IJ SOLN: 4.0000 mg | Freq: Four times a day (QID) | INTRAMUSCULAR | Status: DC | PRN

## 2017-01-20 MED ORDER — ONDANSETRON HCL 4 MG PO TABS: 4.0000 mg | ORAL_TABLET | Freq: Four times a day (QID) | ORAL | Status: DC | PRN

## 2017-01-20 MED ORDER — HYDROCODONE-ACETAMINOPHEN 5-325 MG PO TABS
2.0000 | ORAL_TABLET | Freq: Once | ORAL | Status: AC
  Administered 2017-01-20: 2 via ORAL
  Filled 2017-01-20: qty 2

## 2017-01-20 MED ORDER — HYDROCODONE-ACETAMINOPHEN 5-325 MG PO TABS
1.0000 | ORAL_TABLET | Freq: Four times a day (QID) | ORAL | Status: DC | PRN
  Administered 2017-01-21: 2 via ORAL
  Administered 2017-01-22: 1 via ORAL
  Filled 2017-01-20 (×3): qty 2

## 2017-01-20 MED ORDER — ESCITALOPRAM OXALATE 10 MG PO TABS
5.0000 mg | ORAL_TABLET | Freq: Three times a day (TID) | ORAL | Status: DC
  Administered 2017-01-20 – 2017-01-23 (×10): 5 mg via ORAL
  Filled 2017-01-20 (×10): qty 1

## 2017-01-20 MED ORDER — ACETAMINOPHEN 650 MG RE SUPP: 650.0000 mg | Freq: Four times a day (QID) | RECTAL | Status: DC | PRN

## 2017-01-20 MED ORDER — PANTOPRAZOLE SODIUM 40 MG PO TBEC
40.0000 mg | DELAYED_RELEASE_TABLET | Freq: Every day | ORAL | Status: DC
  Administered 2017-01-20 – 2017-01-23 (×4): 40 mg via ORAL
  Filled 2017-01-20 (×4): qty 1

## 2017-01-20 MED ORDER — INSULIN ASPART 100 UNIT/ML ~~LOC~~ SOLN
4.0000 [IU] | Freq: Three times a day (TID) | SUBCUTANEOUS | Status: DC
  Administered 2017-01-20 – 2017-01-23 (×9): 4 [IU] via SUBCUTANEOUS

## 2017-01-20 MED ORDER — INSULIN DETEMIR 100 UNIT/ML ~~LOC~~ SOLN
35.0000 [IU] | Freq: Every day | SUBCUTANEOUS | Status: DC
  Administered 2017-01-20 – 2017-01-22 (×3): 35 [IU] via SUBCUTANEOUS
  Filled 2017-01-20 (×4): qty 0.35

## 2017-01-20 MED ORDER — ACETAMINOPHEN 325 MG PO TABS
650.0000 mg | ORAL_TABLET | Freq: Four times a day (QID) | ORAL | Status: DC | PRN
  Administered 2017-01-20 – 2017-01-21 (×4): 650 mg via ORAL
  Filled 2017-01-20 (×5): qty 2

## 2017-01-20 MED ORDER — ATORVASTATIN CALCIUM 20 MG PO TABS
20.0000 mg | ORAL_TABLET | Freq: Every day | ORAL | Status: DC
  Administered 2017-01-20 – 2017-01-22 (×3): 20 mg via ORAL
  Filled 2017-01-20 (×3): qty 1

## 2017-01-20 MED ORDER — INSULIN ASPART 100 UNIT/ML ~~LOC~~ SOLN
0.0000 [IU] | Freq: Three times a day (TID) | SUBCUTANEOUS | Status: DC
  Administered 2017-01-20: 3 [IU] via SUBCUTANEOUS
  Administered 2017-01-20: 5 [IU] via SUBCUTANEOUS
  Administered 2017-01-21: 2 [IU] via SUBCUTANEOUS
  Administered 2017-01-22: 5 [IU] via SUBCUTANEOUS
  Administered 2017-01-22: 2 [IU] via SUBCUTANEOUS
  Administered 2017-01-22 – 2017-01-23 (×2): 3 [IU] via SUBCUTANEOUS
  Administered 2017-01-23: 2 [IU] via SUBCUTANEOUS
  Administered 2017-01-23: 3 [IU] via SUBCUTANEOUS

## 2017-01-20 NOTE — Consult Note (Signed)
Name: Dana [name] MRN: [id number] DOB: 1955/12/24    ADMISSION DATE:  01/19/2017 CONSULTATION DATE:  01/19/17  REFERRING MD :  Lita Mains  CHIEF COMPLAINT:  SOB   HISTORY OF PRESENT ILLNESS:  Dana [name] is a 61 y.o. female with a PMH as outlined below.  She had been seen by PCP on 01/16/17 due to SOB with exertion. She was felt to be due to asthma exacerbation with possible developing bronchitis versus pneumonitis. She states this had been ongoing for a period of about 2 weeks. She had not been experiencing any fevers, chills, chest pain, myalgias. Had not had any recent travel exposures to known sick contacts. She was treated with breathing treatments with improvement in her symptoms and vitals remained stable after ambulation office. She was discharged home with instructions to return or go to the ED with any worsening in her symptoms. On 9/10, she had little improvement; therefore, she went back to PCP. From PCP office, she was directed to ED for further evaluation.   In ED, she had CXR which demonstrated near complete opacification of the right hemithorax. CTA of the chest was then obtained which demonstrated very large right pleural effusion with compressive atelectasis. There was no evidence of pulmonary embolus.  She was subsequently transferred to [facility] under the hospitalist service, and PCCM was asked to see in consultation.  Of note, she is a never smoker, but had second hand smoke exposure as a child: both parents were smokers.  SUBJECTIVE:  Feels breathing is stable on 2.5 L [place].Marland Kitchen  Continues to feel better when in  right lateral decub position.  VITAL SIGNS: Temp:  [97.7 F (36.5 C)-98.5 F (36.9 C)] 97.8 F (36.6 C) (09/11 0406) Pulse Rate:  [80-94] 91 (09/11 0406) Resp:  [14-25] 16 (09/11 0406) BP: (120-164)/(72-95) 151/86 (09/11 0406) SpO2:  [88 %-98 %] 96 % (09/11 0406) Weight:  [164 lb (74.4 kg)-180 lb (81.6 kg)] 178 lb 9.2 oz (81 kg) (09/11 0156)  PHYSICAL  EXAMINATION: General: Adult female, no distress, on right side in bed. Neuro: A&O x 3, cranial nerves intact HEENT:  / AT. MMM. Cardiovascular: S1, S2  RRR, no M/R/G. Lungs: Resp. Regular and unlabored at present,Clear throughout on left, diminished throughout on right. Abdomen: BS x 4, S/NT/ND. Musculoskeletal: No deformities, no edema. Skin: Warm, dry intact with no lesions or rash noted. Surgical scar right upper back.   Recent Labs Lab 01/19/17 1725  NA 141  K 5.4*  CL 104  CO2 26  BUN 26*  CREATININE 0.97  GLUCOSE 174*    Recent Labs Lab 01/19/17 1725  HGB 10.5*  HCT 33.0*  WBC 8.2  PLT 517*   Dg Chest 2 View  Result Date: 01/19/2017 CLINICAL DATA:  Worsening shortness of breath for 2 weeks. EXAM: CHEST  2 VIEW COMPARISON:  02/03/2016. FINDINGS: Near complete opacification of the right hemithorax. Small amount of aerated right upper lobe is noted. The left lung is clear. The cardiac silhouette, mediastinal and hilar contours are within normal limits and stable. There is mild tortuosity and calcification of the thoracic aorta. The bony structures are unremarkable. IMPRESSION: Near complete opacification of the right hemithorax could be due to a large pleural effusion or bronchial obstruction with drowned/obstructed lung. Recommend CT with contrast for further evaluation. Electronically Signed   By: Marijo Sanes M.D.   On: 01/19/2017 17:54   Ct Angio Chest Pe W And/or Wo Contrast  Result Date: 01/19/2017 CLINICAL DATA:  Shortness  of Breath EXAM: CT ANGIOGRAPHY CHEST WITH CONTRAST TECHNIQUE: Multidetector CT imaging of the chest was performed using the standard protocol during bolus administration of intravenous contrast. Multiplanar CT image reconstructions and MIPs were obtained to evaluate the vascular anatomy. CONTRAST:  100 cc Isovue 370 IV COMPARISON:  Chest x-ray earlier today FINDINGS: Cardiovascular: Heart is normal size. Aorta is normal caliber. No filling defects  in the pulmonary arteries to suggest pulmonary emboli. Mediastinum/Nodes: No mediastinal, hilar, or axillary adenopathy. Lungs/Pleura: Large right pleural effusion with near complete opacification of the right hemithorax. Compressive atelectasis throughout much of the right lung. Shift of mediastinal structures to the left. No confluent opacity or effusion on the left. Upper Abdomen: Imaging into the upper abdomen shows no acute findings. Musculoskeletal: Chest wall soft tissues are unremarkable. Is no acute bony abnormality. Review of the MIP images confirms the above findings. IMPRESSION: Very large right pleural effusion with near complete opacification of the right hemithorax and compressive atelectasis throughout much of the right lung. Shift of the mediastinal structures to the left. No evidence of pulmonary embolus. Electronically Signed   By: Rolm Baptise M.D.   On: 01/19/2017 19:05    STUDIES:  CXR 9/10 > near complete opacification of the right hemithorax. CTA chest 9/10 > very large right pleural effusion with compressive atelectasis. There was no evidence of pulmonary embolus.  SIGNIFICANT EVENTS  9/10 > admitted. 9/11> For Thoracentesis  ASSESSMENT / PLAN:  Large right pleural effusion - unclear etiology at this point; though suspect transudate.  No history to support infectious process, no hx malignancy, no hx tobacco use. Acute hypoxic respiratory failure - due to above. Atelectasis - due to above. Plan: Assess pleural space under US guidance and plan for diagnostic and therapeutic thoracentesis in AM per Dr. Titus Mould - send for routine studies. Consent for Thoracentesis Stat Coags  Continue supplemental O2 as needed to maintain SpO2 > 92%. Albuterol PRN. Bronchial hygiene with incentive spirometry. Mobilize as able. Assess echo./ BNP CXR post procedure and in am   Remainder of care per primary team.   Magdalen Spatz, [facility]  Medicine Pager: [phone] 01/20/2017, 8:31 AM   STAFF NOTE: I, Merrie Roof, MD FACP have personally reviewed patient's available data, including medical history, events of note, physical examination and test results as part of my evaluation. I have discussed with resident/NP and other care providers such as pharmacist, RN and RRT. In addition, I personally evaluated patient and elicited key findings of: no distress, awake, alert, oriented, slight tracheal deviation to left, reduced BS rt throughout, no lymphad, no mass, abdo distended, possible fluid wave, limited edema, pcxr and Ct I reviewed shows large effusion free flowing with severe compressive ATX  And some med shift to left, no defined lymphad or mass, she does not report wt loss or trauma to chest, also no fevers, we need to thoracentesis, diag and there, and then likley repeat CT chest after drain 1.5-2 liters to assess lung parenchyma, have obtained coags this am for saefty of procedure, have dc IR to dain not needed, I will also [place] chest prior to assess for any localizations not noted prior, will send for cytology, concern is malignancy overall and this has developed over weeks- months as she is in NO distress, I updated pt in full, renal fxn wnl, may tolerate further lasix - neg balance  Lavon Paganini. Titus Mould, MD, [place] Pgr: [facility] 01/20/2017 10:58  AM

## 2017-01-20 NOTE — Procedures (Signed)
Thoracentesis Procedure Note  Pre-operative Diagnosis: large rt effusion  Post-operative Diagnosis: same  Indications: there and diag  Procedure Details  Consent: Informed consent was obtained. Risks of the procedure were discussed including: infection, bleeding, pain, pneumothorax.  Under sterile conditions the patient was positioned. Betadine solution and sterile drapes were utilized.  2% buffered lidocaine was used to anesthetize the 6th rib space. Fluid was obtained without any difficulties and minimal blood loss.  A dressing was applied to the wound and wound care instructions were provided.   Findings 2.1 liters ml of clear yellow pleural fluid was obtained. A sample was sent to Pathology for cytogenetics, flow, and cell counts, as well as for infection analysis.  Complications:  None; patient tolerated the procedure well.          Condition: stable  Plan A follow up chest x-ray was ordered. Bed Rest for 5 hours. Tylenol 650 mg. for pain.  Attending Attestation: I performed the procedure.     US guidance More to takle, ended to avoid re expansion edema Felt better after   Will assess liver and echo

## 2017-01-20 NOTE — H&P (Signed)
History and Physical  Patient Name: Dana [name]     DJT:[id number]    DOB: [birth year]    DOA: 01/19/2017 PCP: Brunetta Jeans, PA-C  Patient coming from: Home --[facility]  Chief Complaint: Dyspnea, cough      HPI: Dana [name] is a 61 y.o. female with a past medical history significant for bipolar disorder, IDDM s/p right BKA who presents with dyspnea and cough for a few weeks.  The patient was in her usual state of health until 1-2 weeks ago when she developed a dry cough, shortness of breath with exertion.  This progressed since then without fever, chills, sputum production, malaise, leg swelling, or hemoptysis. In the interim she did have a Achilles lengthening procedure (under moderate sedation, outpatient only, no immobilization).  She saw her PCP on Friday who prescribed albuterol, doxycycline and Mucinex, which maybe helped, but today when she returned to his office for follow up she was hypoxic and a CXR showed a large RIGHT pleural effusion, so she was sent to the ER.  ED course:  -afebrile, heart rate 86, respirations 16, pulse ox 88% on room air, blood pressure 164/95 -Na 141, K 5.4, Cr 0.97, WBC 8.2K, Hgb 10.5 -d-dimer elevated -lipase normal -BNP 39 -Troponin negative -CT angiogram of the chest showed a large right pleural effusion, no PE -The case was discussed with pulmonology, who agreed to see in consultation and TRH was asked to evaluate for large pleural effusion     ROS: Review of Systems  Constitutional: Negative for chills, fever and malaise/fatigue.  Respiratory: Positive for cough, shortness of breath and wheezing. Negative for hemoptysis and sputum production.   Cardiovascular: Negative for chest pain and leg swelling.  All other systems reviewed and are negative.         Past Medical History:  Diagnosis Date  . Bipolar 1 disorder ([place])   . Cellulitis and abscess of foot 03/08/2015  . Diabetes mellitus    INSULIN DEPENDENT  .  Diabetic neuropathy ([place])   . Diabetic neuropathy ([place])   . 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 H/O hiatal hernia   . Hyperlipidemia   . Hypertension    past hx of  . Iron malabsorption 09/26/2015  . Numbness and tingling    Hx; of in B/LLE and B/LUE  . Other iron deficiency anemias 09/20/2015    Past Surgical History:  Procedure Laterality Date  . ABDOMINAL HYSTERECTOMY    . ADENOIDECTOMY     Hx: of  . AMPUTATION  10/24/2011   Procedure: AMPUTATION RAY;  Surgeon: Newt Minion, MD;  Location: [facility];  Service: Orthopedics;  Laterality: Right;  Right Foot 3rd Ray Amputation  . AMPUTATION Right 12/16/2012   Procedure: Right Foot Transmetatarsal Amputation;  Surgeon: Newt Minion, MD;  Location: [facility];  Service: Orthopedics;  Laterality: Right;  . AMPUTATION Left 03/09/2015   Procedure: LEFT FOOT 1ST RAY AMPUTATION;  Surgeon: Newt Minion, MD;  Location: [facility];  Service: Orthopedics;  Laterality: Left;  . BLADDER SURGERY     x 2, tacked 1st time; mesh "eroded", had to be removed  . Bladder Tact   2002  . BREAST SURGERY Left    "knot removed"  . CARPAL TUNNEL RELEASE Left   . COLON SURGERY    . NASAL SEPTUM SURGERY  1976  . TONSILLECTOMY     age 20's    Social History: Patient lives with her husband.  The patient  is wheelchair bound.  Never smoker.  No alcohol.  Used to be a [employer].  From [place].  Allergies  Allergen Reactions  . Keflex [Cephalexin]     Tremors, rash, hard to breathe  . Lithium Nausea And Vomiting and Other (See Comments)    Other reaction(s): Other (See Comments) Can not keep this medication down. It makes her terribly ill. Can not keep this medication down. It makes her terribly ill.  . Ativan [Lorazepam] Other (See Comments)    Other reaction(s): ANAPHYLAXIS Other reaction(s): Other (See Comments) Abnormal behavior Abnormal behavior  . Demerol [Meperidine] Other (See Comments)    Abnormal behavior (sees things) Other reaction(s):  ANAPHYLAXIS Other reaction(s): Other (See Comments) Abnormal behavior Abnormal behavior  . Oxycodone Other (See Comments)    Other reaction(s): OTHER Abnormal behavior  . Codeine Itching and Other (See Comments)    hallucinations  . Doxycycline Other (See Comments)    Severe muscle tremor  . Darvocet [Propoxyphene N-Acetaminophen] Itching  . Latex Itching    Other reaction(s): OTHER  . Propoxyphene Itching    Family history: family history includes Bipolar disorder in her mother; Diabetes in her maternal grandmother and mother; Hypertension in her father; Mental illness in her mother.  Prior to Admission medications   Medication Sig Start Date End Date Taking? Authorizing Provider  aspirin 325 MG EC tablet Take 325 mg by mouth daily.    [provider]  atorvastatin (LIPITOR) 20 MG tablet Take 1 tablet (20 mg total) by mouth at bedtime. 10/08/16   Brunetta Jeans, PA-C  Blood Glucose Monitoring Suppl (FREESTYLE LITE) DEVI Use to check blood sugar 3 times a day dx code E11.39 05/03/15   Elayne Snare, MD  budesonide-formoterol [facility]) 80-4.5 MCG/ACT inhaler Inhale 2 puffs into the lungs 2 (two) times daily. 01/16/17   Brunetta Jeans, PA-C  escitalopram (LEXAPRO) 5 MG tablet Take 5 mg by mouth 3 (three) times daily.  09/24/16   [provider]  fluticasone (FLONASE) 50 MCG/ACT nasal spray Place 2 sprays into both nostrils daily. 01/02/17   Brunetta Jeans, PA-C  FREESTYLE LITE test strip USE AS INSTRUCTED TO CHECK BLOOD SUGAR THREE TIMES DAILY 08/07/16   Elayne Snare, MD  gabapentin (NEURONTIN) 600 MG tablet TAKE 2 TABLETS BY MOUTH THREE TIMES DAILY 01/05/17   Brunetta Jeans, PA-C  insulin detemir (LEVEMIR) 100 UNIT/ML injection Inject 35 units in the skin daily at 10pm 09/02/16   Elayne Snare, MD  insulin regular (NOVOLIN R RELION) 100 units/mL injection Take 6 units with breakfast, 8 units with lunch and 8 units with dinner Patient taking differently: Inject 4-10  Units into the skin 3 (three) times daily before meals. Per meal size, small meals = less pumps to Vgo and large meal = more pumps to Vgo. 1 pump = 2 units. 04/16/15   Elayne Snare, MD  INSULIN SYRINGE 1CC/29G 29G X 1/2" 1 ML MISC Use 1 per day. 12/26/15   Elayne Snare, MD  Lancets (FREESTYLE) lancets Use as instructed to check blood sugar 3 times per day dx code E11.39 05/03/15   Elayne Snare, MD  metFORMIN (GLUCOPHAGE) 500 MG tablet TAKE TWO TABLETS BY MOUTH ONCE DAILY IN THE MORNING AND ONE ONCE DAILY IN THE EVENING 12/05/16   Elayne Snare, MD  OVER THE COUNTER MEDICATION Take 2 each by mouth daily. Centrum-Muli. Vitamin Gummy    [provider]  pantoprazole (PROTONIX) 40 MG tablet Take 1 tablet (40 mg total) by mouth  daily. 12/16/16   Brunetta Jeans, PA-C       Physical Exam: BP 137/83   Pulse 94   Temp 97.8 F (36.6 C)   Resp 17   Ht 5\' 1"  (1.549 m)   Wt 81 kg (178 lb 9.2 oz)   SpO2 97%   BMI 33.74 kg/m  General appearance: Well-developed, obese elderly adult female, alert and in no acute distress.   Eyes: Anicteric, conjunctiva pink, lids and lashes normal. PERRL.    ENT: No nasal deformity, discharge, epistaxis.  Hearing normal. OP moist without lesions.  Edentulous. Neck: No neck masses.  Trachea midline.  No thyromegaly/tenderness. Lymph: No cervical or supraclavicular lymphadenopathy. Skin: Warm and dry.  No jaundice.  No suspicious rashes or lesions. Cardiac: RRR, nl S1-S2, no murmurs appreciated.  Capillary refill is brisk.  JVP not visible.  No LE edema in right stump, left knee, left leg in cast.  Radial pulses 2+ and symmetric. Respiratory: Normal respiratory rate and rhythm.  Coughs occasionally.  Lung sounds very diminished on right.  Clear on left, no rales or wheezes. Abdomen: Abdomen soft.  No TTP. No ascites, distension, hepatosplenomegaly.   MSK: No deformities or effusions.  No cyanosis or clubbing.  RIght BKA.  Left lower leg in cast. Neuro: Cranial nerves  normal.  Sensation intact to light touch. Speech is fluent.  Muscle strength normal.    Psych: Sensorium intact and responding to questions, attention normal.  Behavior appropriate.  Affect normal.  Judgment and insight appear normal.     Labs on Admission:  I have personally reviewed following labs and imaging studies: CBC:  Recent Labs Lab 01/19/17 1725  WBC 8.2  NEUTROABS 4.9  HGB 10.5*  HCT 33.0*  MCV 93.8  PLT 191*   Basic Metabolic Panel:  Recent Labs Lab 01/19/17 1725  NA 141  K 5.4*  CL 104  CO2 26  GLUCOSE 174*  BUN 26*  CREATININE 0.97  CALCIUM 9.6   GFR: Estimated Creatinine Clearance: 59.5 mL/min (by C-G formula based on SCr of 0.97 mg/dL).  Liver Function Tests:  Recent Labs Lab 01/19/17 1725  AST 26  ALT 64*  ALKPHOS 370*  BILITOT 0.5  PROT 7.4  ALBUMIN 3.1*    Recent Labs Lab 01/19/17 1817  LIPASE 29   No results for input(s): AMMONIA in the last 168 hours. Coagulation Profile: No results for input(s): INR, PROTIME in the last 168 hours. Cardiac Enzymes:  Recent Labs Lab 01/19/17 1823  TROPONINI <0.03   BNP (last 3 results) No results for input(s): PROBNP in the last 8760 hours. HbA1C: No results for input(s): HGBA1C in the last 72 hours. CBG:  Recent Labs Lab 01/20/17 0155  GLUCAP 146*   Lipid Profile: No results for input(s): CHOL, HDL, LDLCALC, TRIG, CHOLHDL, LDLDIRECT in the last 72 hours. Thyroid Function Tests: No results for input(s): TSH, T4TOTAL, FREET4, T3FREE, THYROIDAB in the last 72 hours. Anemia Panel: No results for input(s): VITAMINB12, FOLATE, FERRITIN, TIBC, IRON, RETICCTPCT in the last 72 hours. Sepsis Labs: Invalid input(s): PROCALCITONIN, LACTICIDVEN No results found for this or any previous visit (from the past 240 hour(s)).       Radiological Exams on Admission: Personally reviewed CXR shows right hemithorax opacification; CTA report reviewed: Dg Chest 2 View  Result Date:  01/19/2017 CLINICAL DATA:  Worsening shortness of breath for 2 weeks. EXAM: CHEST  2 VIEW COMPARISON:  02/03/2016. FINDINGS: Near complete opacification of the right hemithorax. Small amount of aerated  right upper lobe is noted. The left lung is clear. The cardiac silhouette, mediastinal and hilar contours are within normal limits and stable. There is mild tortuosity and calcification of the thoracic aorta. The bony structures are unremarkable. IMPRESSION: Near complete opacification of the right hemithorax could be due to a large pleural effusion or bronchial obstruction with drowned/obstructed lung. Recommend CT with contrast for further evaluation. Electronically Signed   By: Marijo Sanes M.D.   On: 01/19/2017 17:54   Ct Angio Chest Pe W And/or Wo Contrast  Result Date: 01/19/2017 CLINICAL DATA:  Shortness of Breath EXAM: CT ANGIOGRAPHY CHEST WITH CONTRAST TECHNIQUE: Multidetector CT imaging of the chest was performed using the standard protocol during bolus administration of intravenous contrast. Multiplanar CT image reconstructions and MIPs were obtained to evaluate the vascular anatomy. CONTRAST:  100 cc Isovue 370 IV COMPARISON:  Chest x-ray earlier today FINDINGS: Cardiovascular: Heart is normal size. Aorta is normal caliber. No filling defects in the pulmonary arteries to suggest pulmonary emboli. Mediastinum/Nodes: No mediastinal, hilar, or axillary adenopathy. Lungs/Pleura: Large right pleural effusion with near complete opacification of the right hemithorax. Compressive atelectasis throughout much of the right lung. Shift of mediastinal structures to the left. No confluent opacity or effusion on the left. Upper Abdomen: Imaging into the upper abdomen shows no acute findings. Musculoskeletal: Chest wall soft tissues are unremarkable. Is no acute bony abnormality. Review of the MIP images confirms the above findings. IMPRESSION: Very large right pleural effusion with near complete opacification of the  right hemithorax and compressive atelectasis throughout much of the right lung. Shift of the mediastinal structures to the left. No evidence of pulmonary embolus. Electronically Signed   By: Rolm Baptise M.D.   On: 01/19/2017 19:05    EKG: Independently reviewed. Rate 93, QTc 483, nonspecific IVCD.  Echocardiogram 2017: Report reviewed EF 60-65% No significant valvular disease       Assessment/Plan Principal Problem:   Pleural effusion Active Problems:   Type 2 diabetes mellitus with diabetic polyneuropathy, with long-term current use of insulin (HCC)   Bipolar 1 disorder (HCC)   Anemia associated with diabetes mellitus ([place])  1. Right Pleural effusion:  Large.  Now causing hypoxia.  No previous history of pleural effusion.  Unclear precipitant.  LFTs are mildly and chronically elevated, unclear if she has had workup. -US guided thoracentesis -Obtain LDH, protein -Obtain US ultrasound liver   2. Diabetes:  -Continue Levemir -Hold aspirin and metformin for now -Continue gabapentin -Mealtime coverage and SSI with meals  3. Bipolar:  -Continue escitalopram  4. Other medications:  -Continue PPI  5. Anemia:  Stable.  6. Hyperkalemia:  Mild, monitor.           DVT prophylaxis: SCDs  Code Status: FULL  Family Communication: None presnet  Disposition Plan: Anticipate US guided thoracentesis tomorrow and monitor for improvement.   Consults called: Pulmonology, by EDP Admission status: OBS At the point of initial evaluation, it is my clinical opinion that admission for OBSERVATION is reasonable and necessary because the patient's presenting complaints in the context of their chronic conditions represent sufficient risk of deterioration or significant morbidity to constitute reasonable grounds for close observation in the hospital setting, but that the patient may be medically stable for discharge from the hospital within 24 to 48 hours.    Medical decision  making: Patient seen at 3:20 AM on 01/20/2017.  What exists of the patient's chart was reviewed in depth and summarized above.  Clinical condition: stable.  Edwin Dada [facility] Pager [phone]

## 2017-01-20 NOTE — Progress Notes (Signed)
  Echocardiogram 2D Echocardiogram has been performed.  Darlina Sicilian M 01/20/2017, 2:58 PM

## 2017-01-20 NOTE — Care Management Obs Status (Signed)
[facility] NOTIFICATION   Patient Details  Name: Dana [name] MRN: [id number] Date of Birth: 07-29-1955   Medicare Observation Status Notification Given:  Yes Discussed with pt who states that she is too sleepy to sign however states that she understands OBS status.    Annella Prowell, Rory Percy, RN 01/20/2017, 11:08 AM

## 2017-01-20 NOTE — Procedures (Signed)
[place] chesat   1. Large massive rt effusion with lung flap 4 cm away, atx lung 2. No clear loculations, appears free flowing  Lavon Paganini. Titus Mould, MD, [place] Pgr: [facility]

## 2017-01-20 NOTE — Progress Notes (Signed)
Patient seen and examined at bedside, patient admitted after midnight, please see earlier detailed admission note by Norval Morton, MD. Briefly, patient presented with dyspnea and found to have a large right pleural effusion. Plan for thoracentesis with labs. Pulmonology consulted.    Cordelia Poche, MD [facility] 01/20/2017, 1:34 PM Pager: [phone]

## 2017-01-21 ENCOUNTER — Inpatient Hospital Stay (HOSPITAL_COMMUNITY): Payer: PPO

## 2017-01-21 DIAGNOSIS — D638 Anemia in other chronic diseases classified elsewhere: Secondary | ICD-10-CM

## 2017-01-21 DIAGNOSIS — E1169 Type 2 diabetes mellitus with other specified complication: Secondary | ICD-10-CM

## 2017-01-21 DIAGNOSIS — E1142 Type 2 diabetes mellitus with diabetic polyneuropathy: Secondary | ICD-10-CM

## 2017-01-21 DIAGNOSIS — F319 Bipolar disorder, unspecified: Secondary | ICD-10-CM

## 2017-01-21 DIAGNOSIS — Z794 Long term (current) use of insulin: Secondary | ICD-10-CM

## 2017-01-21 LAB — PROCALCITONIN

## 2017-01-21 LAB — GLUCOSE, CAPILLARY
GLUCOSE-CAPILLARY: 78 mg/dL (ref 65–99)
GLUCOSE-CAPILLARY: 166 mg/dL — AB (ref 65–99)
Glucose-Capillary: 120 mg/dL — ABNORMAL HIGH (ref 65–99)
Glucose-Capillary: 149 mg/dL — ABNORMAL HIGH (ref 65–99)

## 2017-01-21 LAB — RHEUMATOID FACTORS, FLUID: Rheumatoid Arthritis, Qn/Fluid: NEGATIVE

## 2017-01-21 MED ORDER — LEVOFLOXACIN IN D5W 750 MG/150ML IV SOLN
750.0000 mg | INTRAVENOUS | Status: AC
  Administered 2017-01-21: 750 mg via INTRAVENOUS
  Filled 2017-01-21: qty 150

## 2017-01-21 MED ORDER — LEVOFLOXACIN 750 MG PO TABS
750.0000 mg | ORAL_TABLET | Freq: Every day | ORAL | Status: DC
  Administered 2017-01-22 – 2017-01-23 (×2): 750 mg via ORAL
  Filled 2017-01-21 (×2): qty 1

## 2017-01-21 MED ORDER — LEVOFLOXACIN IN D5W 750 MG/150ML IV SOLN: 750.0000 mg | INTRAVENOUS | Status: DC

## 2017-01-21 MED ORDER — INFLUENZA VAC SPLIT QUAD 0.5 ML IM SUSY
0.5000 mL | PREFILLED_SYRINGE | INTRAMUSCULAR | Status: AC
  Administered 2017-01-22: 0.5 mL via INTRAMUSCULAR
  Filled 2017-01-21: qty 0.5

## 2017-01-21 NOTE — Progress Notes (Signed)
Name: Dana [name] MRN: [id number] DOB: Dec 06, 1955    ADMISSION DATE:  01/19/2017 CONSULTATION DATE:  01/19/17  REFERRING MD :  Lita Mains  CHIEF COMPLAINT:  SOB  Brief patient summary:  Dana [name] is a 61 y.o. female with a PMH as outlined below.  She had been seen by PCP on 01/16/17 due to SOB with exertion. She was felt to be due to asthma exacerbation with possible developing bronchitis versus pneumonitis. She states this had been ongoing for a period of about 2 weeks. She had not been experiencing any fevers, chills, chest pain, myalgias. Had not had any recent travel exposures to known sick contacts. She was treated with breathing treatments with improvement in her symptoms and vitals remained stable after ambulation office. She was discharged home with instructions to return or go to the ED with any worsening in her symptoms. On 9/10, she had little improvement; therefore, she went back to PCP. From PCP office, she was directed to ED for further evaluation.   In ED, she had CXR which demonstrated near complete opacification of the right hemithorax. CTA of the chest was then obtained which demonstrated very large right pleural effusion with compressive atelectasis. There was no evidence of pulmonary embolus.  She was subsequently transferred to [facility] under the hospitalist service, and PCCM was asked to see in consultation.  Of note, she is a never smoker, but had second hand smoke exposure as a child: both parents were smokers.  SUBJECTIVE:   No complaints.  No SOB.  Afebrile  VITAL SIGNS: Temp:  [97.7 F (36.5 C)-98.4 F (36.9 C)] 98.2 F (36.8 C) (09/12 0935) Pulse Rate:  [76-86] 76 (09/12 0935) Resp:  [15-18] 18 (09/12 0935) BP: (120-132)/(76-83) 120/77 (09/12 0935) SpO2:  [96 %-97 %] 97 % (09/12 0935) Weight:  [176 lb 5.9 oz (80 kg)] 176 lb 5.9 oz (80 kg) (09/11 2142)  PHYSICAL EXAMINATION: General:  Adult female lying in bed in NAD HEENT: MM pink/moist Neuro: AOx  3, non-focal CV:  rrr PULM: even/non-labored, rhonchi in right , left clear, no distress on [place]  BL:TJQZ, non-tender, bs+ active  Extremities: warm/dry,  LLE in cast, R BKA Skin: no rashes    Recent Labs Lab 01/19/17 1725 01/20/17 0802  NA 141 138  K 5.4* 4.1  CL 104 102  CO2 26 28  BUN 26* 22*  CREATININE 0.97 0.93  GLUCOSE 174* 219*    Recent Labs Lab 01/19/17 1725  HGB 10.5*  HCT 33.0*  WBC 8.2  PLT 517*   Dg Chest 2 View  Result Date: 01/19/2017 CLINICAL DATA:  Worsening shortness of breath for 2 weeks. EXAM: CHEST  2 VIEW COMPARISON:  02/03/2016. FINDINGS: Near complete opacification of the right hemithorax. Small amount of aerated right upper lobe is noted. The left lung is clear. The cardiac silhouette, mediastinal and hilar contours are within normal limits and stable. There is mild tortuosity and calcification of the thoracic aorta. The bony structures are unremarkable. IMPRESSION: Near complete opacification of the right hemithorax could be due to a large pleural effusion or bronchial obstruction with drowned/obstructed lung. Recommend CT with contrast for further evaluation. Electronically Signed   By: Marijo Sanes M.D.   On: 01/19/2017 17:54   Ct Chest Wo Contrast  Result Date: 01/20/2017 CLINICAL DATA:  Status post right thoracentesis today scratch the status post right thoracentesis for large pleural effusion today. Cough. EXAM: CT CHEST WITHOUT CONTRAST TECHNIQUE: Multidetector CT imaging of the chest was  performed following the standard protocol without IV contrast. COMPARISON:  Single-view of the chest earlier today. CT chest 01/19/2017. FINDINGS: Cardiovascular: No pericardial effusion. Minimal calcific aortic and coronary atherosclerosis noted. Mediastinum/Nodes: No lymphadenopathy. No hiatal hernia. Small coarse calcification in the left lobe of the thyroid incidentally noted. Lungs/Pleura: Small to moderate right pleural effusion is markedly decreased since the  prior examination. No left effusion. There is scattered airspace disease throughout the right lung. No pneumothorax. The left lung is clear. Upper Abdomen: Negative. Musculoskeletal: Negative. IMPRESSION: Marked decrease in right pleural effusion after thoracentesis. Negative for pneumothorax. Airspace disease scattered throughout the right lung could be due to pneumonia or re-expansion pulmonary edema. The appearance is not typical of atelectasis. Mild calcific coronary artery disease. Aortic Atherosclerosis (ICD10-I70.0). Electronically Signed   By: Inge Rise M.D.   On: 01/20/2017 22:32   Ct Angio Chest Pe W And/or Wo Contrast  Result Date: 01/19/2017 CLINICAL DATA:  Shortness of Breath EXAM: CT ANGIOGRAPHY CHEST WITH CONTRAST TECHNIQUE: Multidetector CT imaging of the chest was performed using the standard protocol during bolus administration of intravenous contrast. Multiplanar CT image reconstructions and MIPs were obtained to evaluate the vascular anatomy. CONTRAST:  100 cc Isovue 370 IV COMPARISON:  Chest x-ray earlier today FINDINGS: Cardiovascular: Heart is normal size. Aorta is normal caliber. No filling defects in the pulmonary arteries to suggest pulmonary emboli. Mediastinum/Nodes: No mediastinal, hilar, or axillary adenopathy. Lungs/Pleura: Large right pleural effusion with near complete opacification of the right hemithorax. Compressive atelectasis throughout much of the right lung. Shift of mediastinal structures to the left. No confluent opacity or effusion on the left. Upper Abdomen: Imaging into the upper abdomen shows no acute findings. Musculoskeletal: Chest wall soft tissues are unremarkable. Is no acute bony abnormality. Review of the MIP images confirms the above findings. IMPRESSION: Very large right pleural effusion with near complete opacification of the right hemithorax and compressive atelectasis throughout much of the right lung. Shift of the mediastinal structures to the  left. No evidence of pulmonary embolus. Electronically Signed   By: Rolm Baptise M.D.   On: 01/19/2017 19:05   US Abdomen Complete  Result Date: 01/20/2017 CLINICAL DATA:  Ascites.  Elevated alkaline phosphatase. EXAM: ABDOMEN ULTRASOUND COMPLETE COMPARISON:  None. FINDINGS: Gallbladder: Tiny gravel type gallstones are seen dependently. No gallbladder wall thickening. Sonographer reports negative Murphy's sign. Common bile duct: Diameter: 0.4 cm Liver: No focal lesion identified. Parenchymal echogenicity is increased. Portal vein is patent on color Doppler imaging with normal direction of blood flow towards the liver. IVC: No abnormality visualized. Pancreas: Visualized portion unremarkable. Spleen: Size and appearance within normal limits. Right Kidney: Length: 10.1 cm. Echogenicity within normal limits. No mass or hydronephrosis visualized. Left Kidney: Length: 11.3 cm. Echogenicity within normal limits. No mass or hydronephrosis visualized. Abdominal aorta: No aneurysm visualized. Other findings: None. IMPRESSION: Negative for ascites. Fatty infiltration of the liver. Gravel type stones in the gallbladder without evidence of cholecystitis. Electronically Signed   By: Inge Rise M.D.   On: 01/20/2017 23:26   Dg Chest Port 1 View  Result Date: 01/21/2017 CLINICAL DATA:  Follow-up right pleural effusion following thoracentesis EXAM: PORTABLE CHEST 1 VIEW COMPARISON:  01/20/2017 FINDINGS: Right pleural effusion remains although does slightly decreased when compared with the prior exam. Diffuse patchy infiltrative changes are noted throughout the right lung again likely related to re-expansion edema. The left lung remains clear. No pneumothorax is noted. Cardiac shadow is stable. IMPRESSION: Persistent changes in the right lung  as well as small residual right pleural effusion as described. Electronically Signed   By: Inez Catalina M.D.   On: 01/21/2017 07:54   Dg Chest Port 1 View  Result Date:  01/20/2017 CLINICAL DATA:  Postop right thoracentesis EXAM: PORTABLE CHEST 1 VIEW COMPARISON:  CT chest of 01/19/2017 FINDINGS: Much of the right pleural effusion has been evacuated after right thoracentesis with a small right effusion and right basilar volume loss remaining. No pneumothorax is seen. The left lung is clear. Some haziness in the right upper lung field may be due to re-expansion. Mediastinal and hilar contours are unremarkable, and the heart is within upper limits of normal. No bony abnormality is seen. IMPRESSION: 1. Much of the large right pleural effusion has been evacuated. 2. Some right pleural fluid and right basilar atelectasis remains. No pneumothorax. Electronically Signed   By: Ivar Drape M.D.   On: 01/20/2017 15:00   STUDIES:  CXR 9/10 > near complete opacification of the right hemithorax. CTA chest 9/10 > very large right pleural effusion with compressive atelectasis. There was no evidence of pulmonary embolus. CT chest 9/11 >> marked decrease in right pleural effusion after thoracentesis, neg for pnx, airspace disease scattered throughout the right lung could be due to pneumonia or re-expansion pulmonary edema; appearance not typical of atelectasis; mild calcific CAD, aortic atherosclerosis TTE 9/11 >> EF 57-84% , mild diastolic dysfunction, mild MR and TR [place] abd 9/11 >> neg for ascites, fatty infiltration of liver, gravel type stones in gallbladder without evidence of choleystitis  SIGNIFICANT EVENTS  9/10 > admitted. 9/11> R Thoracentesis, 2.1 liters clear yellow fluid  ASSESSMENT / PLAN:  Large right pleural effusion - exudative by lights criteria.  CT chest post thoracentesis with concern for pna, less likely post-expansion pulmonary edema  No history to support infectious process, no hx malignancy, no hx tobacco use. Acute hypoxic respiratory failure - due to above. Atelectasis - due to above. - TTE normal, US shows fatty liver.   Plan: Follow pleural studies  and cultures PCT now and per protocol Start empiric Levaquin x 7 days CXR in am  OT eval Wean supplemental O2 as needed to maintain SpO2 > 92%. Albuterol PRN. Bronchial hygiene with incentive spirometry. Mobilize as able Keep even I/O balance  Remainder of care per primary team.   Kennieth Rad, AGACNP-BC [place] Pgr: [phone] or if no answer [phone] 01/21/2017, 1:38 PM  STAFF NOTE: I, Merrie Roof, MD FACP have personally reviewed patient's available data, including medical history, events of note, physical examination and test results as part of my evaluation. I have discussed with resident/NP and other care providers such as pharmacist, RN and RRT. In addition, I personally evaluated patient and elicited key findings of:  Awake, alert, less SOB, jvd wnl, improved BS rt base, abdo obese, normal BS, no palpable liver, the fluid is exudative by LIGHTS, the repeat CT I reviewed shows residual infiltrate with bronchogram's present which is either ALI from ATX compression , re expansion edema or PNA underneath causing the effusion, would start ABX, follow pcxr for fast resolution if edema vs PNA / ALI, keep even to neg balance, monitor temp curve and follow cytology, the liver US essentially is WNL and doe snot explain her effusion, her immobility also may be a contirbuter, I updated tp in room, levoflox started, PCt assessment  Lavon Paganini. Titus Mould, MD, [place] Pgr: [facility] 01/21/2017 3:25 PM

## 2017-01-21 NOTE — Consult Note (Signed)
[facility] CM Primary Care Navigator  01/21/2017  Dana [name] Dec 24, 1955 [id number]   Met with patient at the bedside to identify possible discharge needs.  Patient states having "difficulty breathing" that had led to this admission. Patient endorses Dana Rio, PA-C at [facility] [place] as the primary care provider.   Patient shared using [facility] in [place] to obtain medications without any problem.  She manages her own medications at home straight out of the containers.   She mentioned that husband provides transportation to her doctors' appointments but he is currently an inpatient. Transportation resources requested by patient in case unable to get anyone to transport her. [facility] transportation list provided to be used when needed.  Patient states that sister Dana [name]) will be her primary caregiver for the meantime upon discharge. She mentioned that alternative plan is for her to stay at her sister's house while husband remains inpatient.  Anticipated discharge plan is home with sister's assistance per patient. Awaiting for therapy recommendation for discharge.  Patient voiced understanding to call primary care provider's office for a post discharge follow-up appointment within a week or sooner if needs arise. Patient letter (with PCP's contact number) was provided as a reminder.  Explained to patient regarding[facility] CM services available for herand she only opted and verbally agreed to [facility] follow-up calls to monitor herrecovery at home. Patient states being able to manage her diabetes with diet, medications and follow-up with endocrinologist (Dr. Dwyane [name]) if needed.   Referral made for [facility] calls after discharge.   Patient verbalized that she knows to ask for help if she needs it and expressedunderstandingto seek referral from primary care provider to [facility] care management if deemed necessaryfor services in the near  future.  [name] care management information provided for future needs that may arise.  For questions, please contact:  Dana [name], BSN, RN- [facility] Primary Care Navigator  Telephone: [phone] [place]

## 2017-01-21 NOTE — Evaluation (Signed)
Physical Therapy Evaluation Patient Details Name: Dana [name] MRN: [id number] DOB: Jul 02, 1955 Today's Date: 01/21/2017    History of Present Illness  61 yo female with onset of pleural effusion, SOB and cough was admitted and received a thoracentesis on 9/11 with 2.1 L of fluid removed.  Cleared for ascites, has fatty liver and improved hypoxia.  PMHx:  cellulitis, R BK amputation (no prosthesis yet), bipolar, DM, PN, HTN, anemia  Clinical Impression  Pt was seen for evaluation of LE strength and her control of standing and pivoting to BSC.  Her plan is to follow acutely for review on NWB transfers to avoid stressing LLE cast, and to work on LE strengthening toward prosthetic fitting.  HHPT to follow up at DC along with prosthetist.    Follow Up Recommendations Home health PT;Supervision for mobility/OOB    Equipment Recommendations  None recommended by PT    Recommendations for Other Services       Precautions / Restrictions Precautions Precautions: Fall Precaution Comments: cast LLE with no WB information from achilles tendon lengthening Required Braces or Orthoses:  (not fitted for a prosthesis on RLE yet) Restrictions Weight Bearing Restrictions: No Other Position/Activity Restrictions: has casted LLE      Mobility  Bed Mobility Overal bed mobility: Modified Independent                Transfers Overall transfer level: Needs assistance Equipment used: Rolling walker (2 wheeled);1 person hand held assist Transfers: Sit to/from [facility] Sit to Stand: Min assist Stand pivot transfers: Min assist       General transfer comment: cues and support bed to chair  Ambulation/Gait             General Gait Details: unable due to casted LLE and BK RLE without prosthesis  Stairs            Wheelchair Mobility    Modified Rankin (Stroke Patients Only)       Balance Overall balance assessment: Needs assistance Sitting-balance  support: Single extremity supported;Feet supported Sitting balance-Leahy Scale: Fair     Standing balance support: Bilateral upper extremity supported;During functional activity Standing balance-Leahy Scale: Poor                               Pertinent Vitals/Pain Pain Assessment: No/denies pain    Home Living Family/patient expects to be discharged to:: Private residence Living Arrangements: Spouse/significant other Available Help at Discharge: Family;Available 24 hours/day (husband in hospital currently) Type of Home: House Home Access: [place]: One level Home Equipment: [place] - 2 wheels;Wheelchair - manual Additional Comments: no protective shoe for L LE cast    Prior Function Level of Independence: Needs assistance   Gait / Transfers Assistance Needed: transfers only to [facility] currently due to her LLE being casted and R BK amputation  ADL's / Homemaking Assistance Needed: works from [place]: Right    Extremity/Trunk Assessment   Upper Extremity Assessment Upper Extremity Assessment: Overall WFL for tasks assessed    Lower Extremity Assessment Lower Extremity Assessment: Generalized weakness    Cervical / Trunk Assessment Cervical / Trunk Assessment: Normal  Communication   Communication: No difficulties  Cognition Arousal/Alertness: Awake/alert Behavior During Therapy: WFL for tasks assessed/performed Overall Cognitive Status: Within Functional Limits for tasks assessed  General Comments      Exercises     Assessment/Plan    PT Assessment Patient needs continued PT services  PT Problem List Decreased strength;Decreased range of motion;Decreased activity tolerance;Decreased balance;Decreased mobility;Decreased coordination;Decreased knowledge of use of DME;Decreased safety awareness;Cardiopulmonary status limiting  activity;Obesity;Decreased skin integrity       PT Treatment Interventions DME instruction;Gait training;Functional mobility training;Therapeutic activities;Therapeutic exercise;Balance training;Neuromuscular re-education;Patient/family education    PT Goals (Current goals can be found in the Care Plan section)  Acute Rehab PT Goals Patient Stated Goal: to get home and finish prosthetic fitting PT Goal Formulation: With patient Time For Goal Achievement: 02/04/17 Potential to Achieve Goals: Good    Frequency Min 3X/week   Barriers to discharge Decreased caregiver support husband in hospital    Co-evaluation               AM-PAC PT "6 Clicks" Daily Activity  Outcome Measure Difficulty turning over in bed (including adjusting bedclothes, sheets and blankets)?: A Little Difficulty moving from lying on back to sitting on the side of the bed? : A Little Difficulty sitting down on and standing up from a chair with arms (e.g., wheelchair, bedside commode, etc,.)?: Unable Help needed moving to and from a bed to chair (including a wheelchair)?: A Little Help needed walking in hospital room?: Total Help needed climbing 3-5 steps with a railing? : Total 6 Click Score: 12    End of Session Equipment Utilized During Treatment: Gait belt;Oxygen Activity Tolerance: Patient limited by fatigue Patient left: in bed;with call bell/phone within reach Nurse Communication: Mobility status PT Visit Diagnosis: Unsteadiness on feet (R26.81);Muscle weakness (generalized) (M62.81)    Time: 2094-7096 PT Time Calculation (min) (ACUTE ONLY): 16 min   Charges:   PT Evaluation $PT Eval Moderate Complexity: 1 Mod     PT G Codes:   PT G-Codes **NOT FOR INPATIENT CLASS** Functional Assessment Tool Used: AM-PAC 6 Clicks Basic Mobility    Ramond Dial 01/21/2017, 10:23 PM   Mee Hives, PT MS Acute Rehab Dept. Number: [place]

## 2017-01-21 NOTE — Progress Notes (Signed)
Patient ID: Dana [name], female   DOB: [birth year], 61 y.o.   MRN: [id number]  PROGRESS NOTE    Dana [name]  YJE:[id number] DOB: 05-01-1956 DOA: 01/19/2017 PCP: Brunetta Jeans, PA-C   Brief Narrative:  61 year old female with history of bipolar disorder, IDDM status post right BKA, HLD presented with worsening shortness of breath and cough. She was found to have large right sided pleural effusion. She was a bilateral by pulmonary and underwent thoracentesis by pulmonary on 01/20/2017.   Assessment & Plan:   Principal Problem:   Pleural effusion Active Problems:   Type 2 diabetes mellitus with diabetic polyneuropathy, with long-term current use of insulin (HCC)   Bipolar 1 disorder (HCC)   Anemia associated with diabetes mellitus (HCC)   1. Large right Pleural effusion causing hypoxia: - Questionable cause. Status post thoracentesis on 01/20/2017 by pulmonary. Cytology is pending. Follow further recommendations from pulmonary. - Continue oxygen supplementation and wean off as able.   2. Diabetes:  -Continue Levemir -Hold metformin for now -Mealtime coverage and SSI with meals  3. Bipolar:  -Continue escitalopram  4. Hyperlipidemia -Continue statin  5. Anemia:  Stable.  6. Hyperkalemia:  - Improved    DVT prophylaxis: SCDs Code Status:  Full Family Communication: None at bedside Disposition Plan: Home in 1-2 days once cleared by pulmonary  Consultants: Pulmonary   Procedures: Thoracentesis on 01/20/2017  Antimicrobials: None    Subjective: Patient seen and examined at bedside. She feels slightly better compared to yesterday. She is still coughing and short of breath.  Objective: Vitals:   01/20/17 1720 01/20/17 2142 01/21/17 0558 01/21/17 0935  BP: 120/76 132/83 128/77 120/77  Pulse: 84 86 77 76  Resp: 18 16 15 18   Temp: 98.4 F (36.9 C) 97.7 F (36.5 C) 98 F (36.7 C) 98.2 F (36.8 C)  TempSrc: Oral   Oral  SpO2: 97% 97% 96% 97%    Weight:  80 kg (176 lb 5.9 oz)    Height:        Intake/Output Summary (Last 24 hours) at 01/21/17 1341 Last data filed at 01/21/17 0947  Gross per 24 hour  Intake              340 ml  Output              250 ml  Net               90 ml   Filed Weights   01/19/17 1701 01/20/17 0156 01/20/17 2142  Weight: 74.4 kg (164 lb) 81 kg (178 lb 9.2 oz) 80 kg (176 lb 5.9 oz)    Examination:  General exam: Appears calm and comfortable  Respiratory system: Bilateral decreased breath sound at bases With some scattered crackles Cardiovascular system: S1 & S2 heard, rate controlled.  Gastrointestinal system: Abdomen is nondistended, soft and nontender. Normal bowel sounds heard. Extremities: No cyanosis, clubbing, edema. Right BKA   Data Reviewed: I have personally reviewed following labs and imaging studies  CBC:  Recent Labs Lab 01/19/17 1725  WBC 8.2  NEUTROABS 4.9  HGB 10.5*  HCT 33.0*  MCV 93.8  PLT 637*   Basic Metabolic Panel:  Recent Labs Lab 01/19/17 1725 01/20/17 0802  NA 141 138  K 5.4* 4.1  CL 104 102  CO2 26 28  GLUCOSE 174* 219*  BUN 26* 22*  CREATININE 0.97 0.93  CALCIUM 9.6 9.0   GFR: Estimated Creatinine Clearance: 61.6 mL/min (by C-G formula based on  SCr of 0.93 mg/dL). Liver Function Tests:  Recent Labs Lab 01/19/17 1725 01/20/17 0802 01/20/17 1701  AST 26 17  --   ALT 64* 46  --   ALKPHOS 370* 289*  --   BILITOT 0.5 0.4  --   PROT 7.4 6.6 6.7  ALBUMIN 3.1* 2.7*  --     Recent Labs Lab 01/19/17 1817  LIPASE 29   No results for input(s): AMMONIA in the last 168 hours. Coagulation Profile:  Recent Labs Lab 01/20/17 0908  INR 1.04   Cardiac Enzymes:  Recent Labs Lab 01/19/17 1823  TROPONINI <0.03   BNP (last 3 results) No results for input(s): PROBNP in the last 8760 hours. HbA1C: No results for input(s): HGBA1C in the last 72 hours. CBG:  Recent Labs Lab 01/20/17 1135 01/20/17 1700 01/20/17 2139 01/21/17 0812  01/21/17 1201  GLUCAP 189* 151* 143* 120* 149*   Lipid Profile:  Recent Labs  01/20/17 1701  CHOL 128   Thyroid Function Tests: No results for input(s): TSH, T4TOTAL, FREET4, T3FREE, THYROIDAB in the last 72 hours. Anemia Panel: No results for input(s): VITAMINB12, FOLATE, FERRITIN, TIBC, IRON, RETICCTPCT in the last 72 hours. Sepsis Labs: No results for input(s): PROCALCITON, LATICACIDVEN in the last 168 hours.  Recent Results (from the past 240 hour(s))  Body fluid culture     Status: None (Preliminary result)   Collection Time: 01/20/17  2:00 PM  Result Value Ref Range Status   Specimen Description RIGHT PLEURAL  Final   Special Requests Normal  Final   Gram Stain   Final    CYTOSPIN SMEAR WBC PRESENT, PREDOMINANTLY MONONUCLEAR NO ORGANISMS SEEN    Culture NO GROWTH < 24 HOURS  Final   Report Status PENDING  Incomplete         Radiology Studies: Dg Chest 2 View  Result Date: 01/19/2017 CLINICAL DATA:  Worsening shortness of breath for 2 weeks. EXAM: CHEST  2 VIEW COMPARISON:  02/03/2016. FINDINGS: Near complete opacification of the right hemithorax. Small amount of aerated right upper lobe is noted. The left lung is clear. The cardiac silhouette, mediastinal and hilar contours are within normal limits and stable. There is mild tortuosity and calcification of the thoracic aorta. The bony structures are unremarkable. IMPRESSION: Near complete opacification of the right hemithorax could be due to a large pleural effusion or bronchial obstruction with drowned/obstructed lung. Recommend CT with contrast for further evaluation. Electronically Signed   By: Marijo Sanes M.D.   On: 01/19/2017 17:54   Ct Chest Wo Contrast  Result Date: 01/20/2017 CLINICAL DATA:  Status post right thoracentesis today scratch the status post right thoracentesis for large pleural effusion today. Cough. EXAM: CT CHEST WITHOUT CONTRAST TECHNIQUE: Multidetector CT imaging of the chest was performed  following the standard protocol without IV contrast. COMPARISON:  Single-view of the chest earlier today. CT chest 01/19/2017. FINDINGS: Cardiovascular: No pericardial effusion. Minimal calcific aortic and coronary atherosclerosis noted. Mediastinum/Nodes: No lymphadenopathy. No hiatal hernia. Small coarse calcification in the left lobe of the thyroid incidentally noted. Lungs/Pleura: Small to moderate right pleural effusion is markedly decreased since the prior examination. No left effusion. There is scattered airspace disease throughout the right lung. No pneumothorax. The left lung is clear. Upper Abdomen: Negative. Musculoskeletal: Negative. IMPRESSION: Marked decrease in right pleural effusion after thoracentesis. Negative for pneumothorax. Airspace disease scattered throughout the right lung could be due to pneumonia or re-expansion pulmonary edema. The appearance is not typical of atelectasis. Mild calcific coronary  artery disease. Aortic Atherosclerosis (ICD10-I70.0). Electronically Signed   By: Inge Rise M.D.   On: 01/20/2017 22:32   Ct Angio Chest Pe W And/or Wo Contrast  Result Date: 01/19/2017 CLINICAL DATA:  Shortness of Breath EXAM: CT ANGIOGRAPHY CHEST WITH CONTRAST TECHNIQUE: Multidetector CT imaging of the chest was performed using the standard protocol during bolus administration of intravenous contrast. Multiplanar CT image reconstructions and MIPs were obtained to evaluate the vascular anatomy. CONTRAST:  100 cc Isovue 370 IV COMPARISON:  Chest x-ray earlier today FINDINGS: Cardiovascular: Heart is normal size. Aorta is normal caliber. No filling defects in the pulmonary arteries to suggest pulmonary emboli. Mediastinum/Nodes: No mediastinal, hilar, or axillary adenopathy. Lungs/Pleura: Large right pleural effusion with near complete opacification of the right hemithorax. Compressive atelectasis throughout much of the right lung. Shift of mediastinal structures to the left. No  confluent opacity or effusion on the left. Upper Abdomen: Imaging into the upper abdomen shows no acute findings. Musculoskeletal: Chest wall soft tissues are unremarkable. Is no acute bony abnormality. Review of the MIP images confirms the above findings. IMPRESSION: Very large right pleural effusion with near complete opacification of the right hemithorax and compressive atelectasis throughout much of the right lung. Shift of the mediastinal structures to the left. No evidence of pulmonary embolus. Electronically Signed   By: Rolm Baptise M.D.   On: 01/19/2017 19:05   US Abdomen Complete  Result Date: 01/20/2017 CLINICAL DATA:  Ascites.  Elevated alkaline phosphatase. EXAM: ABDOMEN ULTRASOUND COMPLETE COMPARISON:  None. FINDINGS: Gallbladder: Tiny gravel type gallstones are seen dependently. No gallbladder wall thickening. Sonographer reports negative Murphy's sign. Common bile duct: Diameter: 0.4 cm Liver: No focal lesion identified. Parenchymal echogenicity is increased. Portal vein is patent on color Doppler imaging with normal direction of blood flow towards the liver. IVC: No abnormality visualized. Pancreas: Visualized portion unremarkable. Spleen: Size and appearance within normal limits. Right Kidney: Length: 10.1 cm. Echogenicity within normal limits. No mass or hydronephrosis visualized. Left Kidney: Length: 11.3 cm. Echogenicity within normal limits. No mass or hydronephrosis visualized. Abdominal aorta: No aneurysm visualized. Other findings: None. IMPRESSION: Negative for ascites. Fatty infiltration of the liver. Gravel type stones in the gallbladder without evidence of cholecystitis. Electronically Signed   By: Inge Rise M.D.   On: 01/20/2017 23:26   Dg Chest Port 1 View  Result Date: 01/21/2017 CLINICAL DATA:  Follow-up right pleural effusion following thoracentesis EXAM: PORTABLE CHEST 1 VIEW COMPARISON:  01/20/2017 FINDINGS: Right pleural effusion remains although does slightly  decreased when compared with the prior exam. Diffuse patchy infiltrative changes are noted throughout the right lung again likely related to re-expansion edema. The left lung remains clear. No pneumothorax is noted. Cardiac shadow is stable. IMPRESSION: Persistent changes in the right lung as well as small residual right pleural effusion as described. Electronically Signed   By: Inez Catalina M.D.   On: 01/21/2017 07:54   Dg Chest Port 1 View  Result Date: 01/20/2017 CLINICAL DATA:  Postop right thoracentesis EXAM: PORTABLE CHEST 1 VIEW COMPARISON:  CT chest of 01/19/2017 FINDINGS: Much of the right pleural effusion has been evacuated after right thoracentesis with a small right effusion and right basilar volume loss remaining. No pneumothorax is seen. The left lung is clear. Some haziness in the right upper lung field may be due to re-expansion. Mediastinal and hilar contours are unremarkable, and the heart is within upper limits of normal. No bony abnormality is seen. IMPRESSION: 1. Much of the large right  pleural effusion has been evacuated. 2. Some right pleural fluid and right basilar atelectasis remains. No pneumothorax. Electronically Signed   By: Ivar Drape M.D.   On: 01/20/2017 15:00        Scheduled Meds: . atorvastatin  20 mg Oral QHS  . escitalopram  5 mg Oral TID  . gabapentin  1,200 mg Oral TID  . insulin aspart  0-15 Units Subcutaneous TID WC  . insulin aspart  0-5 Units Subcutaneous QHS  . insulin aspart  4 Units Subcutaneous TID WC  . insulin detemir  35 Units Subcutaneous QHS  . pantoprazole  40 mg Oral Daily   Continuous Infusions:   LOS: 1 day        Aline August, MD [facility] Pager [phone]  If 7PM-7AM, please contact night-coverage [email or web address] Password [email or web address] 01/21/2017, 1:41 PM

## 2017-01-21 NOTE — Progress Notes (Addendum)
Pharmacy Antibiotic Note  Dana [name] is a 61 y.o. female admitted on 01/19/2017 with pneumonia.  Pharmacy has been consulted for levaquin dosing. She is tolerating po medications. Plans noted for 7 days antibiotics -WBC= 8.2, afebrile, CrCl ~ 60 and CXR noted with diffuse patchy infiltrative changes -Levaquin 750mg  IV ordered per MD  Plan: -Levaquin 750mg  po q24h beginning 9/13 -Will sign off. Please contact pharmacy with any other needs.  Thank you, Hildred Laser, Pharm D 01/21/2017 2:11 PM     Height: 5\' 1"  (154.9 cm) Weight: 176 lb 5.9 oz (80 kg) IBW/kg (Calculated) : 47.8  Temp (24hrs), Avg:98.1 F (36.7 C), Min:97.7 F (36.5 C), Max:98.4 F (36.9 C)   Recent Labs Lab 01/19/17 1725 01/20/17 0802  WBC 8.2  --   CREATININE 0.97 0.93    Estimated Creatinine Clearance: 61.6 mL/min (by C-G formula based on SCr of 0.93 mg/dL).    Allergies  Allergen Reactions  . Keflex [Cephalexin]     Tremors, rash, hard to breathe  . Lithium Nausea And Vomiting and Other (See Comments)    Other reaction(s): Other (See Comments) Can not keep this medication down. It makes her terribly ill. Can not keep this medication down. It makes her terribly ill.  . Ativan [Lorazepam] Other (See Comments)    Other reaction(s): ANAPHYLAXIS Other reaction(s): Other (See Comments) Abnormal behavior Abnormal behavior  . Demerol [Meperidine] Other (See Comments)    Abnormal behavior (sees things) Other reaction(s): ANAPHYLAXIS Other reaction(s): Other (See Comments) Abnormal behavior Abnormal behavior  . Oxycodone Other (See Comments)    Other reaction(s): OTHER Abnormal behavior  . Codeine Itching and Other (See Comments)    hallucinations  . Doxycycline Other (See Comments)    Severe muscle tremor  . Darvocet [Propoxyphene N-Acetaminophen] Itching  . Latex Itching    Other reaction(s): OTHER  . Propoxyphene Itching

## 2017-01-22 DIAGNOSIS — J181 Lobar pneumonia, unspecified organism: Secondary | ICD-10-CM

## 2017-01-22 LAB — CBC WITH DIFFERENTIAL/PLATELET
Basophils Absolute: 0 10*3/uL (ref 0.0–0.1)
Basophils Relative: 0 %
Eosinophils Absolute: 0.5 10*3/uL (ref 0.0–0.7)
Eosinophils Relative: 6 %
HEMATOCRIT: 34.9 % — AB (ref 36.0–46.0)
Hemoglobin: 10.8 g/dL — ABNORMAL LOW (ref 12.0–15.0)
LYMPHS PCT: 34 %
Lymphs Abs: 2.6 10*3/uL (ref 0.7–4.0)
MCH: 28.6 pg (ref 26.0–34.0)
MCHC: 30.9 g/dL (ref 30.0–36.0)
MCV: 92.3 fL (ref 78.0–100.0)
MONO ABS: 0.5 10*3/uL (ref 0.1–1.0)
MONOS PCT: 7 %
NEUTROS ABS: 4 10*3/uL (ref 1.7–7.7)
Neutrophils Relative %: 53 %
Platelets: 397 10*3/uL (ref 150–400)
RBC: 3.78 MIL/uL — ABNORMAL LOW (ref 3.87–5.11)
RDW: 14.3 % (ref 11.5–15.5)
WBC: 7.6 10*3/uL (ref 4.0–10.5)

## 2017-01-22 LAB — COMPREHENSIVE METABOLIC PANEL
ALT: 25 U/L (ref 14–54)
ANION GAP: 4 — AB (ref 5–15)
AST: 12 U/L — ABNORMAL LOW (ref 15–41)
Albumin: 2.5 g/dL — ABNORMAL LOW (ref 3.5–5.0)
Alkaline Phosphatase: 209 U/L — ABNORMAL HIGH (ref 38–126)
BILIRUBIN TOTAL: 0.2 mg/dL — AB (ref 0.3–1.2)
BUN: 23 mg/dL — AB (ref 6–20)
CO2: 31 mmol/L (ref 22–32)
Calcium: 8.9 mg/dL (ref 8.9–10.3)
Chloride: 103 mmol/L (ref 101–111)
Creatinine, Ser: 0.9 mg/dL (ref 0.44–1.00)
Glucose, Bld: 178 mg/dL — ABNORMAL HIGH (ref 65–99)
POTASSIUM: 5.2 mmol/L — AB (ref 3.5–5.1)
Sodium: 138 mmol/L (ref 135–145)
Total Protein: 6.1 g/dL — ABNORMAL LOW (ref 6.5–8.1)

## 2017-01-22 LAB — GLUCOSE, CAPILLARY
GLUCOSE-CAPILLARY: 209 mg/dL — AB (ref 65–99)
GLUCOSE-CAPILLARY: 126 mg/dL — AB (ref 65–99)
Glucose-Capillary: 163 mg/dL — ABNORMAL HIGH (ref 65–99)
Glucose-Capillary: 141 mg/dL — ABNORMAL HIGH (ref 65–99)

## 2017-01-22 LAB — MAGNESIUM: Magnesium: 1.9 mg/dL (ref 1.7–2.4)

## 2017-01-22 NOTE — Care Management Note (Signed)
Case Management Note  Patient Details  Name: Eilee B [place] MRN: [id number] Date of Birth: 1956-01-13  Subjective/Objective:  CM following for progression and d/c planning.                   Action/Plan: 01/22/2017 Noted  CM consult for [facility] needs, await ordered. Noted PT recommendation for HHPT. Will need orders and face to face.   Expected Discharge Date:  01/23/2017              Expected Discharge Plan:  [place]  In-House Referral:  NA  Discharge planning Services  CM Consult  Post Acute Care Choice:  Home Health Choice offered to:  Patient  DME Arranged:    DME Agency:     HH Arranged:  RN, PT [place] Agency:     Status of Service:  In process, will continue to follow  If discussed at Long Length of Stay Meetings, dates discussed:    Additional Comments:  Adron Bene, RN 01/22/2017, 3:08 PM

## 2017-01-22 NOTE — Progress Notes (Signed)
Name: Dana [name] MRN: [id number] DOB: 05-03-1956    ADMISSION DATE:  01/19/2017 CONSULTATION DATE:  01/19/17  REFERRING MD :  Lita Mains  CHIEF COMPLAINT:  SOB  Brief patient summary:  Amybeth B [name] is a 61 y.o. female with a PMH as outlined below.  She had been seen by PCP on 01/16/17 due to SOB with exertion. She was felt to be due to asthma exacerbation with possible developing bronchitis versus pneumonitis. She states this had been ongoing for a period of about 2 weeks. She had not been experiencing any fevers, chills, chest pain, myalgias. Had not had any recent travel exposures to known sick contacts. She was treated with breathing treatments with improvement in her symptoms and vitals remained stable after ambulation office. She was discharged home with instructions to return or go to the ED with any worsening in her symptoms. On 9/10, she had little improvement; therefore, she went back to PCP. From PCP office, she was directed to ED for further evaluation.   In ED, she had CXR which demonstrated near complete opacification of the right hemithorax. CTA of the chest was then obtained which demonstrated very large right pleural effusion with compressive atelectasis. There was no evidence of pulmonary embolus.  She was subsequently transferred to [facility] under the hospitalist service, and PCCM was asked to see in consultation.  Of note, she is a never smoker, but had second hand smoke exposure as a child: both parents were smokers.  SUBJECTIVE:   Afebrile, normal WBC No complaints per patient.  VITAL SIGNS: Temp:  [97.8 F (36.6 C)-98 F (36.7 C)] 98 F (36.7 C) (09/13 0900) Pulse Rate:  [67-77] 73 (09/13 0900) Resp:  [16-18] 16 (09/13 0900) BP: (111-127)/(72-78) 111/75 (09/13 0900) SpO2:  [97 %-100 %] 98 % (09/13 0900) Weight:  [175 lb 1.6 oz (79.4 kg)] 175 lb 1.6 oz (79.4 kg) (09/12 2014)  PHYSICAL EXAMINATION: General:  Adult female sitting up in recliner in NAD HEENT: MM  pink/moist Neuro: AO, non-focal CV: rrr PULM: even/non-labored, lungs bilaterally clear, diminished, faint crackles in RLL GM:WNUU/ obese, non-tender, bs active  Extremities: warm/dry, LLE in cast, R BKA Skin: no rashes or lesions   Recent Labs Lab 01/19/17 1725 01/20/17 0802 01/22/17 0314  NA 141 138 138  K 5.4* 4.1 5.2*  CL 104 102 103  CO2 26 28 31   BUN 26* 22* 23*  CREATININE 0.97 0.93 0.90  GLUCOSE 174* 219* 178*    Recent Labs Lab 01/19/17 1725 01/22/17 0314  HGB 10.5* 10.8*  HCT 33.0* 34.9*  WBC 8.2 7.6  PLT 517* 397   Ct Chest Wo Contrast  Result Date: 01/20/2017 CLINICAL DATA:  Status post right thoracentesis today scratch the status post right thoracentesis for large pleural effusion today. Cough. EXAM: CT CHEST WITHOUT CONTRAST TECHNIQUE: Multidetector CT imaging of the chest was performed following the standard protocol without IV contrast. COMPARISON:  Single-view of the chest earlier today. CT chest 01/19/2017. FINDINGS: Cardiovascular: No pericardial effusion. Minimal calcific aortic and coronary atherosclerosis noted. Mediastinum/Nodes: No lymphadenopathy. No hiatal hernia. Small coarse calcification in the left lobe of the thyroid incidentally noted. Lungs/Pleura: Small to moderate right pleural effusion is markedly decreased since the prior examination. No left effusion. There is scattered airspace disease throughout the right lung. No pneumothorax. The left lung is clear. Upper Abdomen: Negative. Musculoskeletal: Negative. IMPRESSION: Marked decrease in right pleural effusion after thoracentesis. Negative for pneumothorax. Airspace disease scattered throughout the right lung could be due  to pneumonia or re-expansion pulmonary edema. The appearance is not typical of atelectasis. Mild calcific coronary artery disease. Aortic Atherosclerosis (ICD10-I70.0). Electronically Signed   By: Inge Rise M.D.   On: 01/20/2017 22:32   US Abdomen Complete  Result Date:  01/20/2017 CLINICAL DATA:  Ascites.  Elevated alkaline phosphatase. EXAM: ABDOMEN ULTRASOUND COMPLETE COMPARISON:  None. FINDINGS: Gallbladder: Tiny gravel type gallstones are seen dependently. No gallbladder wall thickening. Sonographer reports negative Murphy's sign. Common bile duct: Diameter: 0.4 cm Liver: No focal lesion identified. Parenchymal echogenicity is increased. Portal vein is patent on color Doppler imaging with normal direction of blood flow towards the liver. IVC: No abnormality visualized. Pancreas: Visualized portion unremarkable. Spleen: Size and appearance within normal limits. Right Kidney: Length: 10.1 cm. Echogenicity within normal limits. No mass or hydronephrosis visualized. Left Kidney: Length: 11.3 cm. Echogenicity within normal limits. No mass or hydronephrosis visualized. Abdominal aorta: No aneurysm visualized. Other findings: None. IMPRESSION: Negative for ascites. Fatty infiltration of the liver. Gravel type stones in the gallbladder without evidence of cholecystitis. Electronically Signed   By: Inge Rise M.D.   On: 01/20/2017 23:26   Dg Chest Port 1 View  Result Date: 01/21/2017 CLINICAL DATA:  Follow-up right pleural effusion following thoracentesis EXAM: PORTABLE CHEST 1 VIEW COMPARISON:  01/20/2017 FINDINGS: Right pleural effusion remains although does slightly decreased when compared with the prior exam. Diffuse patchy infiltrative changes are noted throughout the right lung again likely related to re-expansion edema. The left lung remains clear. No pneumothorax is noted. Cardiac shadow is stable. IMPRESSION: Persistent changes in the right lung as well as small residual right pleural effusion as described. Electronically Signed   By: Inez Catalina M.D.   On: 01/21/2017 07:54   Dg Chest Port 1 View  Result Date: 01/20/2017 CLINICAL DATA:  Postop right thoracentesis EXAM: PORTABLE CHEST 1 VIEW COMPARISON:  CT chest of 01/19/2017 FINDINGS: Much of the right pleural  effusion has been evacuated after right thoracentesis with a small right effusion and right basilar volume loss remaining. No pneumothorax is seen. The left lung is clear. Some haziness in the right upper lung field may be due to re-expansion. Mediastinal and hilar contours are unremarkable, and the heart is within upper limits of normal. No bony abnormality is seen. IMPRESSION: 1. Much of the large right pleural effusion has been evacuated. 2. Some right pleural fluid and right basilar atelectasis remains. No pneumothorax. Electronically Signed   By: Ivar Drape M.D.   On: 01/20/2017 15:00   STUDIES:  CXR 9/10 > near complete opacification of the right hemithorax. CTA chest 9/10 > very large right pleural effusion with compressive atelectasis. There was no evidence of pulmonary embolus. CT chest 9/11 >> marked decrease in right pleural effusion after thoracentesis, neg for pnx, airspace disease scattered throughout the right lung could be due to pneumonia or re-expansion pulmonary edema; appearance not typical of atelectasis; mild calcific CAD, aortic atherosclerosis TTE 9/11 >> EF 24-23% , mild diastolic dysfunction, mild MR and TR [place] abd 9/11 >> neg for ascites, fatty infiltration of liver, gravel type stones in gallbladder without evidence of choleystitis  SIGNIFICANT EVENTS  9/10 > admitted. 9/11> R Thoracentesis, 2.1 liters clear yellow fluid  ASSESSMENT / PLAN:  Large right pleural effusion - exudative by lights criteria.  CT chest post thoracentesis with concern for pna, less likely post-expansion pulmonary edema  No history to support infectious process, no hx malignancy, no hx tobacco use. Acute hypoxic respiratory failure - due  to above. Atelectasis - due to above. - TTE normal, US shows fatty liver -  Initial PCT neg  Plan: Follow pleural culture Trend PCT continue empiric Levaquin x 7 days (stop 9/19) CXR 9/14 Wean supplemental O2 as needed to maintain SpO2 > 92%. Assess home  O2 needs  Pulmonary hygiene with incentive spirometry (do not see in room Mobilize as able per PT/OT Keep even I/O balance  Remainder of care per primary team.  Kennieth Rad, AGACNP-BC [place] Pgr: [phone] or if no answer [phone] 01/22/2017, 11:46 AM   STAFF NOTE: I, Merrie Roof, MD FACP have personally reviewed patient's available data, including medical history, events of note, physical examination and test results as part of my evaluation. I have discussed with resident/NP and other care providers such as pharmacist, RN and RRT. In addition, I personally evaluated patient and elicited key findings of: awake, alert in chair, clear to slight scattered ronchi rt chest, less reduced on rt, abdo soft, she is in chair, she reports continued daily improvements in SOB, no new pcxr, the CT showed bronchogram's underneath of effusion post thora indicating likely PNA as cause, follow up radiograph important to see resolution, cytology is just back and is negative, for 7 days ABX course, will need repeat imaging CT chest as outpt, will need to reduce her O2 needs , needs diuresis per primary to neg balance, ambulatory status as able will also help resolution effusion, will follow in am   Lavon Paganini. Titus Mould, MD, [place] Pgr: [facility] 01/22/2017 4:28 PM

## 2017-01-22 NOTE — Progress Notes (Signed)
Patient ID: Dana [name], female   DOB: 02/10/1956, 62 y.o.   MRN: [id number]  PROGRESS NOTE    Dana [name]  FFM:[id number] DOB: 10-30-1955 DOA: 01/19/2017 PCP: Brunetta Jeans, PA-C   Brief Narrative:  61 year old female with history of bipolar disorder, IDDM status post right BKA, HLD presented with worsening shortness of breath and cough. She was found to have large right sided pleural effusion. She was evaluated by pulmonary and underwent thoracentesis by pulmonary on 01/20/2017.   Assessment & Plan:   Principal Problem:   Pleural effusion Active Problems:   Type 2 diabetes mellitus with diabetic polyneuropathy, with long-term current use of insulin (HCC)   Bipolar 1 disorder (HCC)   Anemia associated with diabetes mellitus (HCC)   1. Large right Pleural effusion probably Parapneumonic from pneumonia: - Status post thoracentesis on 01/20/2017 by pulmonary. Cytology is pending.  - Continue Levaquin for 7 days as per pulmonary recommendations. Repeat chest x-ray for tomorrow. Follow further recommendations from pulmonary. - Continue oxygen supplementation and wean off as able.   2. Diabetes: -Continue Levemir -Hold metformin for now -Mealtime coverage and SSI with meals  3. Bipolar: -Continue escitalopram  4. Hyperlipidemia -Continue statin  5. Anemia: Stable.  6. Hyperkalemia: - Mild. Repeat a.m. labs    DVT prophylaxis: SCDs Code Status:  Full Family Communication: None at bedside Disposition Plan: Home in 1-2 days once cleared by pulmonary  Consultants: Pulmonary   Procedures: Thoracentesis on 01/20/2017  Antimicrobials: Levaquin from 01/21/2017  Subjective: Patient seen and examined at bedside. She feels better, her cough and shortness of breath are improving. No overnight fever or vomiting.  Objective: Vitals:   01/21/17 1734 01/21/17 2014 01/22/17 0428 01/22/17 0900  BP: 127/72 124/78 127/76 111/75  Pulse: 67 77 74 73    Resp: 18 16 16 16   Temp: 98 F (36.7 C) 97.8 F (36.6 C) 97.8 F (36.6 C) 98 F (36.7 C)  TempSrc: Oral Oral Oral Oral  SpO2: 98% 100% 97% 98%  Weight:  79.4 kg (175 lb 1.6 oz)    Height:        Intake/Output Summary (Last 24 hours) at 01/22/17 1228 Last data filed at 01/22/17 0900  Gross per 24 hour  Intake             1210 ml  Output             1176 ml  Net               34 ml   Filed Weights   01/20/17 0156 01/20/17 2142 01/21/17 2014  Weight: 81 kg (178 lb 9.2 oz) 80 kg (176 lb 5.9 oz) 79.4 kg (175 lb 1.6 oz)    Examination:  General exam: Appears calm and comfortable  Respiratory system: Bilateral decreased breath sound at bases With some scattered crackles Cardiovascular system: S1 & S2 heard, rate controlled  Gastrointestinal system: Abdomen is nondistended, soft and nontender. Normal bowel sounds heard. Extremities: No cyanosis, clubbing; right BKA and left lower extremity is in a cast.  Data Reviewed: I have personally reviewed following labs and imaging studies  CBC:  Recent Labs Lab 01/19/17 1725 01/22/17 0314  WBC 8.2 7.6  NEUTROABS 4.9 4.0  HGB 10.5* 10.8*  HCT 33.0* 34.9*  MCV 93.8 92.3  PLT 517* 570   Basic Metabolic Panel:  Recent Labs Lab 01/19/17 1725 01/20/17 0802 01/22/17 0314  NA 141 138 138  K 5.4* 4.1 5.2*  CL 104 102  103  CO2 26 28 31   GLUCOSE 174* 219* 178*  BUN 26* 22* 23*  CREATININE 0.97 0.93 0.90  CALCIUM 9.6 9.0 8.9  MG  --   --  1.9   GFR: Estimated Creatinine Clearance: 63.4 mL/min (by C-G formula based on SCr of 0.9 mg/dL). Liver Function Tests:  Recent Labs Lab 01/19/17 1725 01/20/17 0802 01/20/17 1701 01/22/17 0314  AST 26 17  --  12*  ALT 64* 46  --  25  ALKPHOS 370* 289*  --  209*  BILITOT 0.5 0.4  --  0.2*  PROT 7.4 6.6 6.7 6.1*  ALBUMIN 3.1* 2.7*  --  2.5*    Recent Labs Lab 01/19/17 1817  LIPASE 29   No results for input(s): AMMONIA in the last 168 hours. Coagulation Profile:  Recent  Labs Lab 01/20/17 0908  INR 1.04   Cardiac Enzymes:  Recent Labs Lab 01/19/17 1823  TROPONINI <0.03   BNP (last 3 results) No results for input(s): PROBNP in the last 8760 hours. HbA1C: No results for input(s): HGBA1C in the last 72 hours. CBG:  Recent Labs Lab 01/21/17 1201 01/21/17 1731 01/21/17 2116 01/22/17 0801 01/22/17 1144  GLUCAP 149* 78 166* 163* 209*   Lipid Profile:  Recent Labs  01/20/17 1701  CHOL 128   Thyroid Function Tests: No results for input(s): TSH, T4TOTAL, FREET4, T3FREE, THYROIDAB in the last 72 hours. Anemia Panel: No results for input(s): VITAMINB12, FOLATE, FERRITIN, TIBC, IRON, RETICCTPCT in the last 72 hours. Sepsis Labs:  Recent Labs Lab 01/21/17 1349  PROCALCITON <0.10    Recent Results (from the past 240 hour(s))  Body fluid culture     Status: None (Preliminary result)   Collection Time: 01/20/17  2:00 PM  Result Value Ref Range Status   Specimen Description RIGHT PLEURAL  Final   Special Requests Normal  Final   Gram Stain   Final    CYTOSPIN SMEAR WBC PRESENT, PREDOMINANTLY MONONUCLEAR NO ORGANISMS SEEN    Culture NO GROWTH 2 DAYS  Final   Report Status PENDING  Incomplete         Radiology Studies: Ct Chest Wo Contrast  Result Date: 01/20/2017 CLINICAL DATA:  Status post right thoracentesis today scratch the status post right thoracentesis for large pleural effusion today. Cough. EXAM: CT CHEST WITHOUT CONTRAST TECHNIQUE: Multidetector CT imaging of the chest was performed following the standard protocol without IV contrast. COMPARISON:  Single-view of the chest earlier today. CT chest 01/19/2017. FINDINGS: Cardiovascular: No pericardial effusion. Minimal calcific aortic and coronary atherosclerosis noted. Mediastinum/Nodes: No lymphadenopathy. No hiatal hernia. Small coarse calcification in the left lobe of the thyroid incidentally noted. Lungs/Pleura: Small to moderate right pleural effusion is markedly decreased  since the prior examination. No left effusion. There is scattered airspace disease throughout the right lung. No pneumothorax. The left lung is clear. Upper Abdomen: Negative. Musculoskeletal: Negative. IMPRESSION: Marked decrease in right pleural effusion after thoracentesis. Negative for pneumothorax. Airspace disease scattered throughout the right lung could be due to pneumonia or re-expansion pulmonary edema. The appearance is not typical of atelectasis. Mild calcific coronary artery disease. Aortic Atherosclerosis (ICD10-I70.0). Electronically Signed   By: Inge Rise M.D.   On: 01/20/2017 22:32   US Abdomen Complete  Result Date: 01/20/2017 CLINICAL DATA:  Ascites.  Elevated alkaline phosphatase. EXAM: ABDOMEN ULTRASOUND COMPLETE COMPARISON:  None. FINDINGS: Gallbladder: Tiny gravel type gallstones are seen dependently. No gallbladder wall thickening. Sonographer reports negative Murphy's sign. Common bile duct: Diameter: 0.4  cm Liver: No focal lesion identified. Parenchymal echogenicity is increased. Portal vein is patent on color Doppler imaging with normal direction of blood flow towards the liver. IVC: No abnormality visualized. Pancreas: Visualized portion unremarkable. Spleen: Size and appearance within normal limits. Right Kidney: Length: 10.1 cm. Echogenicity within normal limits. No mass or hydronephrosis visualized. Left Kidney: Length: 11.3 cm. Echogenicity within normal limits. No mass or hydronephrosis visualized. Abdominal aorta: No aneurysm visualized. Other findings: None. IMPRESSION: Negative for ascites. Fatty infiltration of the liver. Gravel type stones in the gallbladder without evidence of cholecystitis. Electronically Signed   By: Inge Rise M.D.   On: 01/20/2017 23:26   Dg Chest Port 1 View  Result Date: 01/21/2017 CLINICAL DATA:  Follow-up right pleural effusion following thoracentesis EXAM: PORTABLE CHEST 1 VIEW COMPARISON:  01/20/2017 FINDINGS: Right pleural  effusion remains although does slightly decreased when compared with the prior exam. Diffuse patchy infiltrative changes are noted throughout the right lung again likely related to re-expansion edema. The left lung remains clear. No pneumothorax is noted. Cardiac shadow is stable. IMPRESSION: Persistent changes in the right lung as well as small residual right pleural effusion as described. Electronically Signed   By: Inez Catalina M.D.   On: 01/21/2017 07:54   Dg Chest Port 1 View  Result Date: 01/20/2017 CLINICAL DATA:  Postop right thoracentesis EXAM: PORTABLE CHEST 1 VIEW COMPARISON:  CT chest of 01/19/2017 FINDINGS: Much of the right pleural effusion has been evacuated after right thoracentesis with a small right effusion and right basilar volume loss remaining. No pneumothorax is seen. The left lung is clear. Some haziness in the right upper lung field may be due to re-expansion. Mediastinal and hilar contours are unremarkable, and the heart is within upper limits of normal. No bony abnormality is seen. IMPRESSION: 1. Much of the large right pleural effusion has been evacuated. 2. Some right pleural fluid and right basilar atelectasis remains. No pneumothorax. Electronically Signed   By: Ivar Drape M.D.   On: 01/20/2017 15:00        Scheduled Meds: . atorvastatin  20 mg Oral QHS  . escitalopram  5 mg Oral TID  . gabapentin  1,200 mg Oral TID  . insulin aspart  0-15 Units Subcutaneous TID WC  . insulin aspart  0-5 Units Subcutaneous QHS  . insulin aspart  4 Units Subcutaneous TID WC  . insulin detemir  35 Units Subcutaneous QHS  . levofloxacin  750 mg Oral Daily  . pantoprazole  40 mg Oral Daily   Continuous Infusions:   LOS: 2 days        Aline August, MD [facility] Pager [phone]  If 7PM-7AM, please contact night-coverage [email or web address] Password [facility] 01/22/2017, 12:28 PM

## 2017-01-22 NOTE — Evaluation (Signed)
Occupational Therapy Evaluation Patient Details Name: Dana [name] MRN: [id number] DOB: 05-02-1956 Today's Date: 01/22/2017    History of Present Illness 61 yo female with onset of pleural effusion, SOB and cough was admitted and received a thoracentesis on 9/11 with 2.1 L of fluid removed.  Cleared for ascites, has fatty liver and improved hypoxia.  PMHx:  cellulitis, R BK amputation (no prosthesis yet), bipolar, DM, PN, HTN, anemia   Clinical Impression   Pt admitted with the above diagnoses and presents with below problem list. Pt will benefit from continued OT to address the below listed deficits and maximize independence with basic ADLs prior to d/c home. PTA pt was mod I with ADLs from w/c level. Pt currently min guard to min A with LB ADLs and functional transfers.      Follow Up Recommendations  Home health OT    Equipment Recommendations  None recommended by OT    Recommendations for Other Services       Precautions / Restrictions Precautions Precautions: Fall Precaution Comments: cast LLE with no WB information from achilles tendon lengthening Restrictions Weight Bearing Restrictions: Yes Other Position/Activity Restrictions: has casted LLE      Mobility Bed Mobility Overal bed mobility: Modified Independent                Transfers Overall transfer level: Needs assistance Equipment used: Rolling walker (2 wheeled) Transfers: Sit to/from [facility] Sit to Stand: Min assist Stand pivot transfers: Min assist;Min guard       General transfer comment: educated on hand placement with rw during transfer.     Balance Overall balance assessment: Needs assistance Sitting-balance support: Single extremity supported;Feet supported Sitting balance-Leahy Scale: Fair     Standing balance support: Bilateral upper extremity supported;During functional activity Standing balance-Leahy Scale: Poor                             ADL  either performed or assessed with clinical judgement   ADL Overall ADL's : Needs assistance/impaired Eating/Feeding: Set up;Sitting   Grooming: Set up;Sitting   Upper Body Bathing: Set up;Sitting   Lower Body Bathing: Minimal assistance;Sit to/from stand   Upper Body Dressing : Set up;Sitting   Lower Body Dressing: Minimal assistance;Sit to/from stand   Toilet Transfer: Min Geophysical data processor Details (indicate cue type and reason): steadied rw due to pt using both hands to pull up on rw during standing. Educated on hand placement during sit<>stand. Toileting- Clothing Manipulation and Hygiene: Sit to/from stand;Minimal assistance   Tub/ Shower Transfer: Tub transfer;Min guard;Stand-pivot;Tub bench;Rolling walker     General ADL Comments: Pt sat EOB about 8 minutes prior to pt completing SPT towards her left side to go from EOB to recliner.      Vision         Perception     Praxis      Pertinent Vitals/Pain Pain Assessment: No/denies pain     Hand Dominance Right   Extremity/Trunk Assessment Upper Extremity Assessment Upper Extremity Assessment: Overall WFL for tasks assessed   Lower Extremity Assessment Lower Extremity Assessment: Defer to PT evaluation   Cervical / Trunk Assessment Cervical / Trunk Assessment: Normal   Communication Communication Communication: No difficulties   Cognition Arousal/Alertness: Awake/alert Behavior During Therapy: WFL for tasks assessed/performed Overall Cognitive Status: Within Functional Limits for tasks assessed  General Comments  Pt on 3L supplemental O2 throughout session with sats 93 EOB at start of session and 92 at end of session.     Exercises     Shoulder Instructions      Home Living Family/patient expects to be discharged to:: Private residence Living Arrangements: Spouse/significant other Available Help at Discharge: Family;Available  24 hours/day (husband in hospital currently) Type of Home: House Home Access: [place]: One level         Bathroom Toilet: [place]: Environmental consultant - 2 wheels;Wheelchair - manual;Tub bench   Additional Comments: no protective shoe for L LE cast      Prior Functioning/Environment Level of Independence: Needs assistance  Gait / Transfers Assistance Needed: transfers only to [facility] currently due to her LLE being casted and R BK amputation ADL's / Homemaking Assistance Needed: works from [facility] level             OT Problem List: Decreased activity tolerance;Impaired balance (sitting and/or standing);Decreased knowledge of use of DME or AE;Decreased knowledge of precautions;Cardiopulmonary status limiting activity;Pain      OT Treatment/Interventions: Self-care/ADL training;Therapeutic exercise;Energy conservation;DME and/or AE instruction;Therapeutic activities;Patient/family education;Balance training    OT Goals(Current goals can be found in the care plan section) Acute Rehab OT Goals Patient Stated Goal: to get home and finish prosthetic fitting OT Goal Formulation: With patient Time For Goal Achievement: 02/05/17 Potential to Achieve Goals: Good ADL Goals Pt Will Perform Lower Body Bathing: with min guard assist;with adaptive equipment;sit to/from stand;sitting/lateral leans Pt Will Perform Lower Body Dressing: with min guard assist;with adaptive equipment;sitting/lateral leans;sit to/from stand Pt Will Transfer to Toilet: with min guard assist;stand pivot transfer;bedside commode Pt Will Perform Toileting - Clothing Manipulation and hygiene: with min guard assist;sit to/from stand;sitting/lateral leans Pt Will Perform Tub/Shower Transfer: Tub transfer;with min guard assist;Stand pivot transfer;tub bench;rolling walker  OT Frequency: Min 2X/week   Barriers to D/C:    spouse is currently in hospital       Co-evaluation               AM-PAC PT "6 Clicks" Daily Activity     Outcome Measure Help from another person eating meals?: None Help from another person taking care of personal grooming?: None Help from another person toileting, which includes using toliet, bedpan, or urinal?: A Little Help from another person bathing (including washing, rinsing, drying)?: A Little Help from another person to put on and taking off regular upper body clothing?: None Help from another person to put on and taking off regular lower body clothing?: A Little 6 Click Score: 21   End of Session Equipment Utilized During Treatment: Rolling walker;Oxygen  Activity Tolerance: Patient tolerated treatment well Patient left: in chair;with call bell/phone within reach  OT Visit Diagnosis: Other abnormalities of gait and mobility (R26.89);Pain                Time: 1116-1140 OT Time Calculation (min): 24 min Charges:  OT General Charges $OT Visit: 1 Visit OT Evaluation $OT Eval Low Complexity: 1 Low G-Codes:       Hortencia Pilar 01/22/2017, 12:13 PM

## 2017-01-22 NOTE — Progress Notes (Signed)
Physical Therapy Treatment Patient Details Name: Dana [name] MRN: [id number] DOB: 12-03-1955 Today's Date: 01/22/2017    History of Present Illness 61 yo female with onset of pleural effusion, SOB and cough was admitted and received a thoracentesis on 9/11 with 2.1 L of fluid removed.  Cleared for ascites, has fatty liver and improved hypoxia.  PMHx:  cellulitis, R BK amputation (no prosthesis yet), bipolar, DM, PN, HTN, anemia    PT Comments    Pt is able to repetitively stand to strengthen LLE but did not attempt gait as she is not clearly permitted to stand on LLE that much with the cast.  Her plan is to continue on with strengthening and transfers but needs supervised help on LLE due to slick surface of cast.     Follow Up Recommendations  Home health PT;Supervision for mobility/OOB     Equipment Recommendations  None recommended by PT    Recommendations for Other Services       Precautions / Restrictions Precautions Precautions: Fall Precaution Comments: cast LLE with no WB information from achilles tendon lengthening Restrictions Weight Bearing Restrictions: No Other Position/Activity Restrictions: has casted LLE    Mobility  Bed Mobility Overal bed mobility: Modified Independent                Transfers Overall transfer level: Needs assistance Equipment used: Rolling walker (2 wheeled) Transfers: Sit to/from [facility] Sit to Stand: Min guard Stand pivot transfers: Min guard       General transfer comment: reminders for hand placement  Ambulation/Gait             General Gait Details: not able due to cast and lack of prosthesis   Stairs            Wheelchair Mobility    Modified Rankin (Stroke Patients Only)       Balance Overall balance assessment: Needs assistance Sitting-balance support: Single extremity supported;Feet supported Sitting balance-Leahy Scale: Good       Standing balance-Leahy Scale: Poor                               Cognition Arousal/Alertness: Awake/alert Behavior During Therapy: WFL for tasks assessed/performed Overall Cognitive Status: Within Functional Limits for tasks assessed                                        Exercises General Exercises - Lower Extremity Long Arc Quad: Strengthening;Both;20 reps Heel Slides: Strengthening;Both;20 reps Hip ABduction/ADduction: Strengthening;Both;20 reps    General Comments General comments (skin integrity, edema, etc.): pt was taken off O2 for session and noted with mult stands was 85% from 97% but rapidly returned to 90's.  Nsg agreed to leave off cannula and ck her this afternoon      Pertinent Vitals/Pain Pain Assessment: No/denies pain    Home Living                      Prior Function            PT Goals (current goals can now be found in the care plan section) Acute Rehab PT Goals Patient Stated Goal: to get home and finish prosthetic fitting Progress towards PT goals: Progressing toward goals    Frequency    Min 3X/week      PT Plan  Current plan remains appropriate    Co-evaluation              AM-PAC PT "6 Clicks" Daily Activity  Outcome Measure  Difficulty turning over in bed (including adjusting bedclothes, sheets and blankets)?: A Little Difficulty moving from lying on back to sitting on the side of the bed? : A Little Difficulty sitting down on and standing up from a chair with arms (e.g., wheelchair, bedside commode, etc,.)?: A Little Help needed moving to and from a bed to chair (including a wheelchair)?: A Little Help needed walking in hospital room?: A Lot Help needed climbing 3-5 steps with a railing? : Total 6 Click Score: 15    End of Session   Activity Tolerance: Patient tolerated treatment well;Other (comment) (remains on stable ground with no O2 x with repetitive standi) Patient left: in chair;with call bell/phone within reach Nurse  Communication: Mobility status;Other (comment) (O2 sats without cannula) PT Visit Diagnosis: Unsteadiness on feet (R26.81);Muscle weakness (generalized) (M62.81)     Time: 1505-6979 PT Time Calculation (min) (ACUTE ONLY): 30 min  Charges:  $Therapeutic Exercise: 8-22 mins $Therapeutic Activity: 8-22 mins                    G Codes:  Functional Assessment Tool Used: AM-PAC 6 Clicks Basic Mobility   Ramond Dial 01/22/2017, 5:11 PM   Mee Hives, PT MS Acute Rehab Dept. Number: [place] and [place]

## 2017-01-23 ENCOUNTER — Inpatient Hospital Stay (HOSPITAL_COMMUNITY): Payer: PPO

## 2017-01-23 ENCOUNTER — Telehealth: Payer: Self-pay | Admitting: Internal Medicine

## 2017-01-23 DIAGNOSIS — J189 Pneumonia, unspecified organism: Principal | ICD-10-CM

## 2017-01-23 LAB — GLUCOSE, CAPILLARY
Glucose-Capillary: 194 mg/dL — ABNORMAL HIGH (ref 65–99)
Glucose-Capillary: 155 mg/dL — ABNORMAL HIGH (ref 65–99)
Glucose-Capillary: 149 mg/dL — ABNORMAL HIGH (ref 65–99)

## 2017-01-23 LAB — PROCALCITONIN: Procalcitonin: <0.1 ng/mL

## 2017-01-23 LAB — BASIC METABOLIC PANEL
Anion gap: 8 (ref 5–15)
BUN: 21 mg/dL — ABNORMAL HIGH (ref 6–20)
CO2: 26 mmol/L (ref 22–32)
CREATININE: 0.87 mg/dL (ref 0.44–1.00)
Calcium: 8.9 mg/dL (ref 8.9–10.3)
Chloride: 102 mmol/L (ref 101–111)
GFR calc Af Amer: >60 mL/min (ref >60)
GFR calc non Af Amer: >60 mL/min (ref >60)
Glucose, Bld: 187 mg/dL — ABNORMAL HIGH (ref 65–99)
Potassium: 4 mmol/L (ref 3.5–5.1)
Sodium: 136 mmol/L (ref 135–145)

## 2017-01-23 LAB — CHOLESTEROL, BODY FLUID: Cholesterol, Fluid: 107 mg/dL

## 2017-01-23 LAB — MAGNESIUM: Magnesium: 2 mg/dL (ref 1.7–2.4)

## 2017-01-23 MED ORDER — LEVOFLOXACIN 750 MG PO TABS: 750.0000 mg | ORAL_TABLET | Freq: Every day | ORAL | 0 refills | Status: DC

## 2017-01-23 NOTE — Care Management Note (Addendum)
Case Management Note  Patient Details  Name: Dana [name] MRN: [id number] Date of Birth: 06-Aug-1955  Subjective/Objective:  CM following for progression and d/c planning.                   Action/Plan: 01/23/2017 Met with pt re d/c needs. Per pt she is currently a pt at outpatient rehab in [facility] and wishes to continue rehab at that facility as they were working with her [place]. She states that she feels well enough to continue there. Pt also states that she has DME in the home and has no further needs.  Pt states that her sister will provide transportation home.   Expected Discharge Date:  01/23/17               Expected Discharge Plan:  OP Rehab  In-House Referral:  NA  Discharge planning Services  CM Consult  Post Acute Care Choice:  NA Choice offered to:  Patient  DME Arranged:  N/A DME Agency:  NA  HH Arranged:  NA HH Agency:  NA  Status of Service:  Completed, signed off  If discussed at [place] of Stay Meetings, dates discussed:    Additional Comments:  Adron Bene, RN 01/23/2017, 10:32 AM

## 2017-01-23 NOTE — Telephone Encounter (Signed)
Triage  Because office is closed 01/23/2017 sending you this phone msg. This patient Dana [name] with [place] [address] needs inpatient followup in office with an APP for her pleural effusion analysis . Please call her and give her an appt. In 10 days or so from 01/23/2017 She is getting discharged 01/23/2017   Dr. Brand Males, M.D., [facility].C.P Pulmonary and Critical Care Medicine Staff Physician [facility] Pager: [phone], If no answer or between  15:00h - 7:00h: call [phone]  01/23/2017 9:48 AM

## 2017-01-23 NOTE — Discharge Summary (Signed)
Physician Discharge Summary  Dana [name] IOX:[id number] DOB: [birth year] DOA: 01/19/2017  PCP: Brunetta Jeans, PA-C  Admit date: 01/19/2017 Discharge date: 01/23/2017  Admitted From: Home Disposition:  Home  Recommendations for Outpatient Follow-up:  1. Follow up with PCP in 1 week 2. Follow-up with pulmonary in 1-2 weeks with repeat chest x-ray   Home Health: Yes  Equipment/Devices: None  Discharge Condition: Stable  CODE STATUS: Full  Diet recommendation: Heart Healthy / Carb Modified  Brief/Interim Summary: 61 year old female with history of bipolar disorder, IDDM status post right BKA, HLD presented with worsening shortness of breath and cough. She was found to have large right sided pleural effusion. She was evaluated by pulmonary and underwent thoracentesis by pulmonary on 01/20/2017. She was started on Levaquin postthoracentesis as per pulmonary recommendations. Pleural fluid cultures are negative and cytology is negative. She is currently in room air. She'll be discharged to complete 7 day of antibiotic therapy  Discharge Diagnoses:  Principal Problem:   Pleural effusion Active Problems:   Type 2 diabetes mellitus with diabetic polyneuropathy, with long-term current use of insulin (HCC)   Bipolar 1 disorder (HCC)   Anemia associated with diabetes mellitus (HCC)  1. Large right Pleural effusion probably Parapneumonic from pneumonia: - Status post thoracentesis on 01/20/2017 by pulmonary. Cultures negative and cytology was negative. - She was started on Levaquin postthoracentesis as per pulmonary recommendations. She'll be discharged to complete 7 day of antibiotic therapy. Currently in room air -Outpatient follow-up with pulmonary in 1-2 weeks with repeat chest x-ray.  2. Diabetes: -Continue outpatient medications. Outpatient follow-up  3. Bipolar disorder: -Continue escitalopram  4. Hyperlipidemia -Continue statin  5. Anemia: Stable.  6.  Hyperkalemia: - Mild. Improved.  Discharge Instructions  Discharge Instructions    Ambulatory referral to Pulmonology    Complete by:  As directed    Follow up in 1-2 weeks   Call MD for:  difficulty breathing, headache or visual disturbances    Complete by:  As directed    Call MD for:  extreme fatigue    Complete by:  As directed    Call MD for:  persistant dizziness or light-headedness    Complete by:  As directed    Call MD for:  persistant nausea and vomiting    Complete by:  As directed    Call MD for:  redness, tenderness, or signs of infection (pain, swelling, redness, odor or green/yellow discharge around incision site)    Complete by:  As directed    Call MD for:  severe uncontrolled pain    Complete by:  As directed    Call MD for:  temperature >100.4    Complete by:  As directed    Diet - low sodium heart healthy    Complete by:  As directed    Increase activity slowly    Complete by:  As directed      Allergies as of 01/23/2017      Reactions   Keflex [cephalexin]    Tremors, rash, hard to breathe   Lithium Nausea And Vomiting, Other (See Comments)   Other reaction(s): Other (See Comments) Can not keep this medication down. It makes her terribly ill. Can not keep this medication down. It makes her terribly ill.   Ativan [lorazepam] Other (See Comments)   Other reaction(s): ANAPHYLAXIS Other reaction(s): Other (See Comments) Abnormal behavior Abnormal behavior   Demerol [meperidine] Other (See Comments)   Abnormal behavior (sees things) Other reaction(s): ANAPHYLAXIS Other reaction(s): Other (  See Comments) Abnormal behavior Abnormal behavior   Oxycodone Other (See Comments)   Other reaction(s): OTHER Abnormal behavior   Codeine Itching, Other (See Comments)   hallucinations   Doxycycline Other (See Comments)   Severe muscle tremor   Darvocet [propoxyphene N-acetaminophen] Itching   Latex Itching   Other reaction(s): OTHER   Propoxyphene Itching       Medication List    TAKE these medications   aspirin 325 MG EC tablet Take 325 mg by mouth daily.   atorvastatin 20 MG tablet Commonly known as:  LIPITOR Take 1 tablet (20 mg total) by mouth at bedtime.   budesonide-formoterol 80-4.5 MCG/ACT inhaler Commonly known as:  SYMBICORT Inhale 2 puffs into the lungs 2 (two) times daily.   escitalopram 5 MG tablet Commonly known as:  LEXAPRO Take 5 mg by mouth 3 (three) times daily.   fluticasone 50 MCG/ACT nasal spray Commonly known as:  FLONASE Place 2 sprays into both nostrils daily.   freestyle lancets Use as instructed to check blood sugar 3 times per day dx code E11.39   FREESTYLE LITE Devi Use to check blood sugar 3 times a day dx code E11.39   FREESTYLE LITE test strip Generic drug:  glucose blood USE AS INSTRUCTED TO CHECK BLOOD SUGAR THREE TIMES DAILY   gabapentin 600 MG tablet Commonly known as:  NEURONTIN TAKE 2 TABLETS BY MOUTH THREE TIMES DAILY What changed:  See the new instructions.   insulin detemir 100 UNIT/ML injection Commonly known as:  LEVEMIR Inject 35 units in the skin daily at 10pm   insulin regular 100 units/mL injection Commonly known as:  NOVOLIN R RELION Take 6 units with breakfast, 8 units with lunch and 8 units with dinner What changed:  how much to take  how to take this  when to take this  additional instructions   INSULIN SYRINGE 1CC/29G 29G X 1/2" 1 ML Misc Use 1 per day.   levofloxacin 750 MG tablet Commonly known as:  LEVAQUIN Take 1 tablet (750 mg total) by mouth daily.   metFORMIN 500 MG tablet Commonly known as:  GLUCOPHAGE TAKE TWO TABLETS BY MOUTH ONCE DAILY IN THE MORNING AND ONE ONCE DAILY IN THE EVENING What changed:  See the new instructions.   OVER THE COUNTER MEDICATION Take 2 each by mouth daily. Centrum-Muli. Vitamin Gummy   pantoprazole 40 MG tablet Commonly known as:  PROTONIX Take 1 tablet (40 mg total) by mouth daily.            Discharge  Care Instructions        Start     Ordered   01/23/17 0000  levofloxacin (LEVAQUIN) 750 MG tablet  Daily     01/23/17 0939   01/23/17 0000  Increase activity slowly     01/23/17 0939   01/23/17 0000  Diet - low sodium heart healthy     01/23/17 0939   01/23/17 0000  Call MD for:  temperature >100.4     01/23/17 0939   01/23/17 0000  Call MD for:  persistant nausea and vomiting     01/23/17 0939   01/23/17 0000  Call MD for:  severe uncontrolled pain     01/23/17 0939   01/23/17 0000  Call MD for:  redness, tenderness, or signs of infection (pain, swelling, redness, odor or green/yellow discharge around incision site)     01/23/17 0939   01/23/17 0000  Call MD for:  difficulty breathing, headache or visual disturbances  01/23/17 0939   01/23/17 0000  Call MD for:  persistant dizziness or light-headedness     01/23/17 0939   01/23/17 0000  Call MD for:  extreme fatigue     01/23/17 0939   01/23/17 0000  Ambulatory referral to Pulmonology    Comments:  Follow up in 1-2 weeks   01/23/17 5885     Follow-up Information    Brunetta Jeans, PA-C Follow up in 1 week(s).   Specialty:  Family Medicine Contact information: [address] [place] [address] [phone]        Raylene Miyamoto, MD Follow up in 1 week(s).   Specialty:  Pulmonary Disease Why:  follow up in 1-2 weeks. call for appointment with repeat Chest Xray Contact information: [address] [place] [address] [phone]          Allergies  Allergen Reactions  . Keflex [Cephalexin]     Tremors, rash, hard to breathe  . Lithium Nausea And Vomiting and Other (See Comments)    Other reaction(s): Other (See Comments) Can not keep this medication down. It makes her terribly ill. Can not keep this medication down. It makes her terribly ill.  . Ativan [Lorazepam] Other (See Comments)    Other reaction(s): ANAPHYLAXIS Other reaction(s): Other (See Comments) Abnormal  behavior Abnormal behavior  . Demerol [Meperidine] Other (See Comments)    Abnormal behavior (sees things) Other reaction(s): ANAPHYLAXIS Other reaction(s): Other (See Comments) Abnormal behavior Abnormal behavior  . Oxycodone Other (See Comments)    Other reaction(s): OTHER Abnormal behavior  . Codeine Itching and Other (See Comments)    hallucinations  . Doxycycline Other (See Comments)    Severe muscle tremor  . Darvocet [Propoxyphene N-Acetaminophen] Itching  . Latex Itching    Other reaction(s): OTHER  . Propoxyphene Itching    Consultations:  Pulmonary   Procedures/Studies: Dg Chest 2 View  Result Date: 01/23/2017 CLINICAL DATA:  Pleural effusion. EXAM: CHEST  2 VIEW COMPARISON:  01/21/2017. FINDINGS: Mediastinum hilar structures normal. Heart size stable. Diffuse infiltrate right lung again noted. Interim improvement from prior exam. Stable right pleural effusion. No pneumothorax. IMPRESSION: Interim partial clearing of diffuse right pulmonary infiltrate. Stable right pleural effusion. Electronically Signed   By: Marcello Moores  Register   On: 01/23/2017 07:01   Dg Chest 2 View  Result Date: 01/19/2017 CLINICAL DATA:  Worsening shortness of breath for 2 weeks. EXAM: CHEST  2 VIEW COMPARISON:  02/03/2016. FINDINGS: Near complete opacification of the right hemithorax. Small amount of aerated right upper lobe is noted. The left lung is clear. The cardiac silhouette, mediastinal and hilar contours are within normal limits and stable. There is mild tortuosity and calcification of the thoracic aorta. The bony structures are unremarkable. IMPRESSION: Near complete opacification of the right hemithorax could be due to a large pleural effusion or bronchial obstruction with drowned/obstructed lung. Recommend CT with contrast for further evaluation. Electronically Signed   By: Marijo Sanes M.D.   On: 01/19/2017 17:54   Ct Chest Wo Contrast  Result Date: 01/20/2017 CLINICAL DATA:  Status  post right thoracentesis today scratch the status post right thoracentesis for large pleural effusion today. Cough. EXAM: CT CHEST WITHOUT CONTRAST TECHNIQUE: Multidetector CT imaging of the chest was performed following the standard protocol without IV contrast. COMPARISON:  Single-view of the chest earlier today. CT chest 01/19/2017. FINDINGS: Cardiovascular: No pericardial effusion. Minimal calcific aortic and coronary atherosclerosis noted. Mediastinum/Nodes: No lymphadenopathy. No hiatal hernia. Small  coarse calcification in the left lobe of the thyroid incidentally noted. Lungs/Pleura: Small to moderate right pleural effusion is markedly decreased since the prior examination. No left effusion. There is scattered airspace disease throughout the right lung. No pneumothorax. The left lung is clear. Upper Abdomen: Negative. Musculoskeletal: Negative. IMPRESSION: Marked decrease in right pleural effusion after thoracentesis. Negative for pneumothorax. Airspace disease scattered throughout the right lung could be due to pneumonia or re-expansion pulmonary edema. The appearance is not typical of atelectasis. Mild calcific coronary artery disease. Aortic Atherosclerosis (ICD10-I70.0). Electronically Signed   By: Inge Rise M.D.   On: 01/20/2017 22:32   Ct Angio Chest Pe W And/or Wo Contrast  Result Date: 01/19/2017 CLINICAL DATA:  Shortness of Breath EXAM: CT ANGIOGRAPHY CHEST WITH CONTRAST TECHNIQUE: Multidetector CT imaging of the chest was performed using the standard protocol during bolus administration of intravenous contrast. Multiplanar CT image reconstructions and MIPs were obtained to evaluate the vascular anatomy. CONTRAST:  100 cc Isovue 370 IV COMPARISON:  Chest x-ray earlier today FINDINGS: Cardiovascular: Heart is normal size. Aorta is normal caliber. No filling defects in the pulmonary arteries to suggest pulmonary emboli. Mediastinum/Nodes: No mediastinal, hilar, or axillary adenopathy.  Lungs/Pleura: Large right pleural effusion with near complete opacification of the right hemithorax. Compressive atelectasis throughout much of the right lung. Shift of mediastinal structures to the left. No confluent opacity or effusion on the left. Upper Abdomen: Imaging into the upper abdomen shows no acute findings. Musculoskeletal: Chest wall soft tissues are unremarkable. Is no acute bony abnormality. Review of the MIP images confirms the above findings. IMPRESSION: Very large right pleural effusion with near complete opacification of the right hemithorax and compressive atelectasis throughout much of the right lung. Shift of the mediastinal structures to the left. No evidence of pulmonary embolus. Electronically Signed   By: Rolm Baptise M.D.   On: 01/19/2017 19:05   US Abdomen Complete  Result Date: 01/20/2017 CLINICAL DATA:  Ascites.  Elevated alkaline phosphatase. EXAM: ABDOMEN ULTRASOUND COMPLETE COMPARISON:  None. FINDINGS: Gallbladder: Tiny gravel type gallstones are seen dependently. No gallbladder wall thickening. Sonographer reports negative Murphy's sign. Common bile duct: Diameter: 0.4 cm Liver: No focal lesion identified. Parenchymal echogenicity is increased. Portal vein is patent on color Doppler imaging with normal direction of blood flow towards the liver. IVC: No abnormality visualized. Pancreas: Visualized portion unremarkable. Spleen: Size and appearance within normal limits. Right Kidney: Length: 10.1 cm. Echogenicity within normal limits. No mass or hydronephrosis visualized. Left Kidney: Length: 11.3 cm. Echogenicity within normal limits. No mass or hydronephrosis visualized. Abdominal aorta: No aneurysm visualized. Other findings: None. IMPRESSION: Negative for ascites. Fatty infiltration of the liver. Gravel type stones in the gallbladder without evidence of cholecystitis. Electronically Signed   By: Inge Rise M.D.   On: 01/20/2017 23:26   Dg Chest Port 1 View  Result  Date: 01/21/2017 CLINICAL DATA:  Follow-up right pleural effusion following thoracentesis EXAM: PORTABLE CHEST 1 VIEW COMPARISON:  01/20/2017 FINDINGS: Right pleural effusion remains although does slightly decreased when compared with the prior exam. Diffuse patchy infiltrative changes are noted throughout the right lung again likely related to re-expansion edema. The left lung remains clear. No pneumothorax is noted. Cardiac shadow is stable. IMPRESSION: Persistent changes in the right lung as well as small residual right pleural effusion as described. Electronically Signed   By: Inez Catalina M.D.   On: 01/21/2017 07:54   Dg Chest Port 1 View  Result Date: 01/20/2017 CLINICAL DATA:  Postop right thoracentesis EXAM: PORTABLE CHEST 1 VIEW COMPARISON:  CT chest of 01/19/2017 FINDINGS: Much of the right pleural effusion has been evacuated after right thoracentesis with a small right effusion and right basilar volume loss remaining. No pneumothorax is seen. The left lung is clear. Some haziness in the right upper lung field may be due to re-expansion. Mediastinal and hilar contours are unremarkable, and the heart is within upper limits of normal. No bony abnormality is seen. IMPRESSION: 1. Much of the large right pleural effusion has been evacuated. 2. Some right pleural fluid and right basilar atelectasis remains. No pneumothorax. Electronically Signed   By: Ivar Drape M.D.   On: 01/20/2017 15:00    Thoracentesis on 01/20/2017  Echo on 01/20/2017: Study Conclusions  - Left ventricle: The cavity size was normal. There was mild focal   basal hypertrophy of the septum. Systolic function was normal.   The estimated ejection fraction was in the range of 55% to 60%.   Wall motion was normal; there were no regional wall motion   abnormalities. Doppler parameters are consistent with abnormal   left ventricular relaxation (grade 1 diastolic dysfunction).   Doppler parameters are consistent with high  ventricular filling   pressure. - Mitral valve: Calcified annulus. There was mild regurgitation.  Impressions:  - Normal LV systolic function; mild diastolic dysfunction; mild MR   and TR.    Subjective: Patient seen and examined at bedside. She feels much better. Her cough and shortness of breath is improving. Overnight fever, nausea or vomiting. She was to go home  Discharge Exam: Vitals:   01/22/17 2104 01/23/17 0625  BP: 124/76 118/74  Pulse: 81 79  Resp: 18 17  Temp: 98.1 F (36.7 C) 97.6 F (36.4 C)  SpO2: 98% 91%   Vitals:   01/22/17 0900 01/22/17 1729 01/22/17 2104 01/23/17 0625  BP: 111/75 122/74 124/76 118/74  Pulse: 73 76 81 79  Resp: 16 18 18 17   Temp: 98 F (36.7 C) 98 F (36.7 C) 98.1 F (36.7 C) 97.6 F (36.4 C)  TempSrc: Oral Oral Oral Oral  SpO2: 98% 98% 98% 91%  Weight:   80.7 kg (178 lb)   Height:        General: Pt is alert, awake, not in acute distress Cardiovascular: Rate controlled, S1/S2 + Respiratory: Bilateral decreased breath sounds at bases Abdominal: Soft, NT, ND, bowel sounds + Extremities: Right BKA and left lower extremity is in a cast, no cyanosis    The results of significant diagnostics from this hospitalization (including imaging, microbiology, ancillary and laboratory) are listed below for reference.     Microbiology: Recent Results (from the past 240 hour(s))  Body fluid culture     Status: None (Preliminary result)   Collection Time: 01/20/17  2:00 PM  Result Value Ref Range Status   Specimen Description RIGHT PLEURAL  Final   Special Requests Normal  Final   Gram Stain   Final    CYTOSPIN SMEAR WBC PRESENT, PREDOMINANTLY MONONUCLEAR NO ORGANISMS SEEN    Culture NO GROWTH 3 DAYS  Final   Report Status PENDING  Incomplete     Labs: BNP (last 3 results)  Recent Labs  01/19/17 1817  BNP 00.8   Basic Metabolic Panel:  Recent Labs Lab 01/19/17 1725 01/20/17 0802 01/22/17 0314 01/23/17 0535  NA 141  138 138 136  K 5.4* 4.1 5.2* 4.0  CL 104 102 103 102  CO2 26 28 31 26   GLUCOSE 174*  219* 178* 187*  BUN 26* 22* 23* 21*  CREATININE 0.97 0.93 0.90 0.87  CALCIUM 9.6 9.0 8.9 8.9  MG  --   --  1.9 2.0   Liver Function Tests:  Recent Labs Lab 01/19/17 1725 01/20/17 0802 01/20/17 1701 01/22/17 0314  AST 26 17  --  12*  ALT 64* 46  --  25  ALKPHOS 370* 289*  --  209*  BILITOT 0.5 0.4  --  0.2*  PROT 7.4 6.6 6.7 6.1*  ALBUMIN 3.1* 2.7*  --  2.5*    Recent Labs Lab 01/19/17 1817  LIPASE 29   No results for input(s): AMMONIA in the last 168 hours. CBC:  Recent Labs Lab 01/19/17 1725 01/22/17 0314  WBC 8.2 7.6  NEUTROABS 4.9 4.0  HGB 10.5* 10.8*  HCT 33.0* 34.9*  MCV 93.8 92.3  PLT 517* 397   Cardiac Enzymes:  Recent Labs Lab 01/19/17 1823  TROPONINI <0.03   BNP: Invalid input(s): POCBNP CBG:  Recent Labs Lab 01/22/17 0801 01/22/17 1144 01/22/17 1641 01/22/17 2110 01/23/17 0826  GLUCAP 163* 209* 126* 141* 194*   D-Dimer No results for input(s): DDIMER in the last 72 hours. Hgb A1c No results for input(s): HGBA1C in the last 72 hours. Lipid Profile  Recent Labs  01/20/17 1701  CHOL 128   Thyroid function studies No results for input(s): TSH, T4TOTAL, T3FREE, THYROIDAB in the last 72 hours.  Invalid input(s): FREET3 Anemia work up No results for input(s): VITAMINB12, FOLATE, FERRITIN, TIBC, IRON, RETICCTPCT in the last 72 hours. Urinalysis    Component Value Date/Time   COLORURINE YELLOW 10/01/2016 1531   APPEARANCEUR CLEAR 10/01/2016 1531   LABSPEC >=1.030 (A) 10/01/2016 1531   PHURINE 5.5 10/01/2016 1531   GLUCOSEU NEGATIVE 10/01/2016 1531   HGBUR TRACE-INTACT (A) 10/01/2016 1531   BILIRUBINUR NEGATIVE 10/01/2016 1531   BILIRUBINUR neg 02/12/2016 1350   KETONESUR TRACE (A) 10/01/2016 1531   PROTEINUR positive +3 02/12/2016 1350   PROTEINUR 100 (A) 02/03/2016 1800   UROBILINOGEN 0.2 10/01/2016 1531   NITRITE NEGATIVE 10/01/2016  1531   LEUKOCYTESUR NEGATIVE 10/01/2016 1531   Sepsis Labs Invalid input(s): PROCALCITONIN,  WBC,  LACTICIDVEN Microbiology Recent Results (from the past 240 hour(s))  Body fluid culture     Status: None (Preliminary result)   Collection Time: 01/20/17  2:00 PM  Result Value Ref Range Status   Specimen Description RIGHT PLEURAL  Final   Special Requests Normal  Final   Gram Stain   Final    CYTOSPIN SMEAR WBC PRESENT, PREDOMINANTLY MONONUCLEAR NO ORGANISMS SEEN    Culture NO GROWTH 3 DAYS  Final   Report Status PENDING  Incomplete     Time coordinating discharge: 35 minutes  SIGNED:   Aline August, MD  [facility] 01/23/2017, 9:39 AM Pager: [phone]  If 7PM-7AM, please contact night-coverage [email or web address] Password [email or web address]

## 2017-01-24 LAB — BODY FLUID CULTURE
Culture: NO GROWTH
SPECIAL REQUESTS: NORMAL

## 2017-01-24 LAB — ADENOSIDE DEAMINASE, PLEURAL FL

## 2017-01-26 ENCOUNTER — Telehealth: Payer: Self-pay

## 2017-01-26 NOTE — Telephone Encounter (Signed)
Left message for patient to call back to get scheduled.  

## 2017-01-26 NOTE — Telephone Encounter (Signed)
LM requesting call back to complete TCM and schedule hospital follow up.   

## 2017-01-27 DIAGNOSIS — M86271 Subacute osteomyelitis, right ankle and foot: Secondary | ICD-10-CM | POA: Diagnosis not present

## 2017-01-27 NOTE — Telephone Encounter (Signed)
LM requesting call back to complete TCM and sched hosp f/u.

## 2017-01-28 NOTE — Telephone Encounter (Signed)
Attempted to reach patient to complete TCM and schedule hospital f/u, VM full.

## 2017-01-30 NOTE — Telephone Encounter (Signed)
Fourth attempt to reach patient to complete TCM and schedule hospital f/u, VM full.

## 2017-02-02 DIAGNOSIS — Z7984 Long term (current) use of oral hypoglycemic drugs: Secondary | ICD-10-CM | POA: Diagnosis not present

## 2017-02-02 DIAGNOSIS — E11621 Type 2 diabetes mellitus with foot ulcer: Secondary | ICD-10-CM | POA: Diagnosis not present

## 2017-02-02 DIAGNOSIS — E11610 Type 2 diabetes mellitus with diabetic neuropathic arthropathy: Secondary | ICD-10-CM | POA: Diagnosis not present

## 2017-02-02 DIAGNOSIS — L97522 Non-pressure chronic ulcer of other part of left foot with fat layer exposed: Secondary | ICD-10-CM | POA: Diagnosis not present

## 2017-02-02 DIAGNOSIS — E1142 Type 2 diabetes mellitus with diabetic polyneuropathy: Secondary | ICD-10-CM | POA: Diagnosis not present

## 2017-02-17 DIAGNOSIS — E11610 Type 2 diabetes mellitus with diabetic neuropathic arthropathy: Secondary | ICD-10-CM | POA: Diagnosis not present

## 2017-02-17 DIAGNOSIS — E1142 Type 2 diabetes mellitus with diabetic polyneuropathy: Secondary | ICD-10-CM | POA: Diagnosis not present

## 2017-02-17 DIAGNOSIS — L97521 Non-pressure chronic ulcer of other part of left foot limited to breakdown of skin: Secondary | ICD-10-CM | POA: Diagnosis not present

## 2017-02-17 DIAGNOSIS — E11621 Type 2 diabetes mellitus with foot ulcer: Secondary | ICD-10-CM | POA: Diagnosis not present

## 2017-02-22 ENCOUNTER — Other Ambulatory Visit: Payer: Self-pay | Admitting: Endocrinology

## 2017-02-26 DIAGNOSIS — M86271 Subacute osteomyelitis, right ankle and foot: Secondary | ICD-10-CM | POA: Diagnosis not present

## 2017-03-03 DIAGNOSIS — E1142 Type 2 diabetes mellitus with diabetic polyneuropathy: Secondary | ICD-10-CM | POA: Diagnosis not present

## 2017-03-03 DIAGNOSIS — E11610 Type 2 diabetes mellitus with diabetic neuropathic arthropathy: Secondary | ICD-10-CM | POA: Diagnosis not present

## 2017-03-03 DIAGNOSIS — E11621 Type 2 diabetes mellitus with foot ulcer: Secondary | ICD-10-CM | POA: Diagnosis not present

## 2017-03-03 DIAGNOSIS — L97522 Non-pressure chronic ulcer of other part of left foot with fat layer exposed: Secondary | ICD-10-CM | POA: Diagnosis not present

## 2017-03-04 ENCOUNTER — Encounter: Payer: PPO | Attending: Endocrinology | Admitting: Nutrition

## 2017-03-04 ENCOUNTER — Encounter: Payer: Self-pay | Admitting: Endocrinology

## 2017-03-04 ENCOUNTER — Other Ambulatory Visit: Payer: Self-pay | Admitting: Emergency Medicine

## 2017-03-04 ENCOUNTER — Other Ambulatory Visit: Payer: Self-pay | Admitting: Physician Assistant

## 2017-03-04 ENCOUNTER — Telehealth: Payer: Self-pay | Admitting: Endocrinology

## 2017-03-04 ENCOUNTER — Ambulatory Visit (INDEPENDENT_AMBULATORY_CARE_PROVIDER_SITE_OTHER): Payer: PPO | Admitting: Endocrinology

## 2017-03-04 VITALS — BP 118/68 | HR 75 | Ht 60.5 in | Wt 181.4 lb

## 2017-03-04 DIAGNOSIS — Z029 Encounter for administrative examinations, unspecified: Secondary | ICD-10-CM | POA: Insufficient documentation

## 2017-03-04 DIAGNOSIS — E1165 Type 2 diabetes mellitus with hyperglycemia: Secondary | ICD-10-CM | POA: Diagnosis not present

## 2017-03-04 DIAGNOSIS — E1142 Type 2 diabetes mellitus with diabetic polyneuropathy: Secondary | ICD-10-CM

## 2017-03-04 DIAGNOSIS — Z794 Long term (current) use of insulin: Secondary | ICD-10-CM

## 2017-03-04 DIAGNOSIS — E785 Hyperlipidemia, unspecified: Secondary | ICD-10-CM

## 2017-03-04 LAB — POCT GLYCOSYLATED HEMOGLOBIN (HGB A1C): HEMOGLOBIN A1C: 7.4

## 2017-03-04 MED ORDER — V-GO 30 KIT: 1.0000 | PACK | Freq: Every day | 2 refills | Status: DC

## 2017-03-04 MED ORDER — ATORVASTATIN CALCIUM 20 MG PO TABS: 20.0000 mg | ORAL_TABLET | Freq: Every day | ORAL | 1 refills | Status: DC

## 2017-03-04 NOTE — Patient Instructions (Addendum)
Metformin 2, twice daily  Levemir 18 units 2x daily  Regular 12 at dinner not 10  Cut down sodas  With pump click 3x at Bfst, lunch and 4-5 dinner

## 2017-03-04 NOTE — Progress Notes (Signed)
Patient ID: Dana [name], female   DOB: 1956/03/16, 61 y.o.   MRN: [id number]           Reason for Appointment: Follow-up for Type 2 Diabetes  Referring physician: Elyn Aquas  History of Present Illness:          Date of diagnosis of type 2 diabetes mellitus: 2006       Background history:   She thinks her blood sugar was 300-400 at the time of diagnosis and she was started on insulin soon after this. She thinks she has been on metformin only for the last 5 years Her A1c history is available since only about 2013 and this had been consistently over 10% She  had  poor control of her diabetes with pre-consultation A1c of 13%, was on Levemir only  Although she did better with the V-go pump 30 in 2016 she stopped this because of increased cost after a short time  Recent history:    Her A1c is now 7.4, has been as low as 6.7, previously up to 7.8  INSULIN regimen is: 35 Levemir at bedtime, regular insulin before meals: 8--0/8-10  Current blood sugar patterns, daily management and problems identified:  She has had overall higher blood sugars compared to her last visit  Again she ran out of test strips and has not checked her blood sugars last 8 days  inconsistent blood sugar control and did not have her test strips also to monitor for about 10 days until last Friday  Her glucose levels are generally higher in the late afternoon   She will sometimes eat out at lunchtime and not take her insulin with her  She is now having about 8 ounces of regular soft drinks about everyday  Highest blood sugars are after supper and fairly consistently high  FASTING blood sugars are mildly increased overall with only one or 2 good readings  She is still not adjusting her mealtime doses based on meal size of carbohydrate intake with some readings over 200  Although she thinks she is generally eating low fat meals she will sometimes have meets and not always cutting back on fat intake  No  recent hypoglycemia                         Non-insulin hypoglycemic drugs the patient is taking are: Metformin 1g bid Side effects from medications have been: None  Compliance with the medical regimen: Good Hypoglycemia: As above    Glucose monitoring:  done 2-3 times a day         Glucometer:  FreeStyle .      Blood Glucose readings by time of day  Mean values apply above for all meters except median for One Touch  PRE-MEAL Fasting Lunch Dinner Bedtime Overall  Glucose range:  97-242  81-1 80  132-189  1 60-343    Mean/median: 179   169  240  189      Self-care:  Typical meal intake: Breakfast is irregular otherwise may have toast and an egg or sausage.  Lunch is a sandwich or cheeseburger, evening meal is meat and 2 vegetables.  For snacks  will have peanut butter crackers in pms Dinner 5-7 pm     She has regular drinks 1/2 per day, milk 1 cups daily          Dietician visit, most recent: Never  Exercise: none  Weight history: Stable over the last 3-4 years, was 400 pounds about 5 years ago  Wt Readings from Last 3 Encounters:  03/04/17 181 lb 6.4 oz (82.3 kg)  01/22/17 178 lb (80.7 kg)  01/19/17 180 lb (81.6 kg)    Glycemic control:   Lab Results  Component Value Date   HGBA1C 7.4 03/04/2017   HGBA1C 7.1 (H) 12/02/2016   HGBA1C 6.7 08/13/2016   Lab Results  Component Value Date   MICROALBUR 0.8 10/01/2016   LDLCALC 77 10/01/2016   CREATININE 0.87 01/23/2017       Allergies as of 03/04/2017      Reactions   Keflex [cephalexin]    Tremors, rash, hard to breathe   Lithium Nausea And Vomiting, Other (See Comments)   Other reaction(s): Other (See Comments) Can not keep this medication down. It makes her terribly ill. Can not keep this medication down. It makes her terribly ill.   Ativan [lorazepam] Other (See Comments)   Other reaction(s): ANAPHYLAXIS Other reaction(s): Other (See Comments) Abnormal behavior Abnormal behavior   Demerol  [meperidine] Other (See Comments)   Abnormal behavior (sees things) Other reaction(s): ANAPHYLAXIS Other reaction(s): Other (See Comments) Abnormal behavior Abnormal behavior   Oxycodone Other (See Comments)   Other reaction(s): OTHER Abnormal behavior   Codeine Itching, Other (See Comments)   hallucinations   Doxycycline Other (See Comments)   Severe muscle tremor   Darvocet [propoxyphene N-acetaminophen] Itching   Latex Itching   Other reaction(s): OTHER   Propoxyphene Itching      Medication List       Accurate as of 03/04/17  8:23 PM. Always use your most recent med list.          aspirin 325 MG EC tablet Take 325 mg by mouth daily.   atorvastatin 20 MG tablet Commonly known as:  LIPITOR Take 1 tablet (20 mg total) by mouth at bedtime.   budesonide-formoterol 80-4.5 MCG/ACT inhaler Commonly known as:  SYMBICORT Inhale 2 puffs into the lungs 2 (two) times daily.   escitalopram 5 MG tablet Commonly known as:  LEXAPRO Take 5 mg by mouth 3 (three) times daily.   fluticasone 50 MCG/ACT nasal spray Commonly known as:  FLONASE Place 2 sprays into both nostrils daily.   freestyle lancets Use as instructed to check blood sugar 3 times per day dx code E11.39   FREESTYLE LITE Devi Use to check blood sugar 3 times a day dx code E11.39   FREESTYLE LITE test strip Generic drug:  glucose blood USE AS DIRECTED TO CHECK BLOOD SUGAR THREE TIMES DAILY   gabapentin 600 MG tablet Commonly known as:  NEURONTIN TAKE 2 TABLETS BY MOUTH THREE TIMES DAILY   insulin detemir 100 UNIT/ML injection Commonly known as:  LEVEMIR Inject 35 units in the skin daily at 10pm   insulin regular 100 units/mL injection Commonly known as:  NOVOLIN R RELION Take 6 units with breakfast, 8 units with lunch and 8 units with dinner   INSULIN SYRINGE 1CC/29G 29G X 1/2" 1 ML Misc Use 1 per day.   levofloxacin 750 MG tablet Commonly known as:  LEVAQUIN Take 1 tablet (750 mg total) by mouth  daily.   metFORMIN 500 MG tablet Commonly known as:  GLUCOPHAGE TAKE TWO TABLETS BY MOUTH ONCE DAILY IN THE MORNING AND ONE ONCE DAILY IN THE EVENING   OVER THE COUNTER MEDICATION Take 2 each by mouth daily. Centrum-Muli. Vitamin Gummy   pantoprazole 40 MG tablet  Commonly known as:  PROTONIX Take 1 tablet (40 mg total) by mouth daily.   V-GO 30 Kit 1 Pump by Does not apply route daily.       Allergies:  Allergies  Allergen Reactions  . Keflex [Cephalexin]     Tremors, rash, hard to breathe  . Lithium Nausea And Vomiting and Other (See Comments)    Other reaction(s): Other (See Comments) Can not keep this medication down. It makes her terribly ill. Can not keep this medication down. It makes her terribly ill.  . Ativan [Lorazepam] Other (See Comments)    Other reaction(s): ANAPHYLAXIS Other reaction(s): Other (See Comments) Abnormal behavior Abnormal behavior  . Demerol [Meperidine] Other (See Comments)    Abnormal behavior (sees things) Other reaction(s): ANAPHYLAXIS Other reaction(s): Other (See Comments) Abnormal behavior Abnormal behavior  . Oxycodone Other (See Comments)    Other reaction(s): OTHER Abnormal behavior  . Codeine Itching and Other (See Comments)    hallucinations  . Doxycycline Other (See Comments)    Severe muscle tremor  . Darvocet [Propoxyphene N-Acetaminophen] Itching  . Latex Itching    Other reaction(s): OTHER  . Propoxyphene Itching    Past Medical History:  Diagnosis Date  . Bipolar 1 disorder ([place])   . Cellulitis and abscess of foot 03/08/2015  . Diabetes mellitus    INSULIN DEPENDENT  . Diabetic neuropathy ([place])   . Diabetic neuropathy ([place])   . 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 H/O hiatal hernia   . Hyperlipidemia   . Hypertension    past hx of  . Iron malabsorption 09/26/2015  . Numbness and tingling    Hx; of in B/LLE and B/LUE  . Other iron deficiency anemias 09/20/2015    Past Surgical History:    Procedure Laterality Date  . ABDOMINAL HYSTERECTOMY    . ADENOIDECTOMY     Hx: of  . AMPUTATION  10/24/2011   Procedure: AMPUTATION RAY;  Surgeon: Newt Minion, MD;  Location: [facility];  Service: Orthopedics;  Laterality: Right;  Right Foot 3rd Ray Amputation  . AMPUTATION Right 12/16/2012   Procedure: Right Foot Transmetatarsal Amputation;  Surgeon: Newt Minion, MD;  Location: [facility];  Service: Orthopedics;  Laterality: Right;  . AMPUTATION Left 03/09/2015   Procedure: LEFT FOOT 1ST RAY AMPUTATION;  Surgeon: Newt Minion, MD;  Location: [facility];  Service: Orthopedics;  Laterality: Left;  . BLADDER SURGERY     x 2, tacked 1st time; mesh "eroded", had to be removed  . Bladder Tact   2002  . BREAST SURGERY Left    "knot removed"  . CARPAL TUNNEL RELEASE Left   . COLON SURGERY    . NASAL SEPTUM SURGERY  1976  . TONSILLECTOMY     age 87's    Family History  Problem Relation Age of Onset  . Diabetes Mother   . Mental illness Mother   . Bipolar disorder Mother   . Hypertension Father   . Diabetes Maternal Grandmother   . Heart disease Neg Hx     Social History:  reports that she has never smoked. She has never used smokeless tobacco. She reports that she does not drink alcohol or use drugs.    Review of Systems   Has diabetic neuropathy with sensory loss Taking  61m gabapentin and analgesics from PCP  DIABETIC foot ulcers with history of amputations: She is being followed [facility] the [facility]  She is needing to get a diabetic shoe for the left  foot from [place]  On the right she has a prosthesis after her BKA   Lipid history: On treatment with Lipitor and followed by PCP    Lab Results  Component Value Date   CHOL 128 01/20/2017   HDL 40.10 10/01/2016   LDLCALC 77 10/01/2016   TRIG 175.0 (H) 10/01/2016   CHOLHDL 4 10/01/2016             Physical Examination:  BP 118/68 (Cuff Size: Normal)   Pulse 75   Ht 5' 0.5" (1.537 m)   Wt 181 lb 6.4 oz (82.3 kg)   SpO2  98%   BMI 34.84 kg/m        ASSESSMENT:  Diabetes type 2, uncontrolled     See history of present illness for detailed discussion of  current management, blood sugar patterns and problems identified  Her blood sugars are poorly controlled with mostly high blood sugars when she checks an average about nearly 190 Most of her high readings are after supper Most likely she may be getting consistent daytime blood sugar control because of either not taking her lunchtime coverage or the Levemir is not lasting 24 She is also needing to do better with her diet with lower fat intake and cutting out regular soft drinks Unable to do much physical activity Also not taking maximum dose of metformin  HYPERLIPIDEMIA: Better controlled recently She will continue Lipitor  Recommendations: Increased metformin to 1 g in the evening also She will look into the V-go pump as she thinks she was do better with this and is willing to pay for this now She will be instructed by the nurse educator about basic principles of uses of the pump Most likely she can cut back on her boluses and to 6 units at breakfast and lunch and 8-10 at lunchtime with this  Meanwhile she will need to take 10-12 units of regular insulin at suppertime based on what she is eating and drinking She will take Levemir twice a day, 18 units twice a day until starting the pump She will monitor more blood sugars after breakfast and lunch also Encouraged her to cut back on soft drinks and high-fat meats  Will send prescription for diabetic shoes when information is available from the podiatrist  Patient Instructions  Metformin 2, twice daily  Levemir 18 units 2x daily  Regular 12 at dinner not 10  Cut down sodas  With pump click 3x at Bfst, lunch and 4-5 dinner    Counseling time on subjects discussed in assessment and plan sections is over 50% of today's 25 minute visit    Drusilla Wampole 03/04/2017, 8:23 PM   Note: This office  note was prepared with Dragon voice recognition system technology. Any transcriptional errors that result from this process are unintentional.

## 2017-03-04 NOTE — Telephone Encounter (Signed)
Patient need a PA for the [facility]

## 2017-03-08 ENCOUNTER — Other Ambulatory Visit: Payer: Self-pay | Admitting: Endocrinology

## 2017-03-08 DIAGNOSIS — E1165 Type 2 diabetes mellitus with hyperglycemia: Principal | ICD-10-CM

## 2017-03-08 DIAGNOSIS — E11319 Type 2 diabetes mellitus with unspecified diabetic retinopathy without macular edema: Secondary | ICD-10-CM

## 2017-03-08 DIAGNOSIS — IMO0002 Reserved for concepts with insufficient information to code with codable children: Secondary | ICD-10-CM

## 2017-03-08 DIAGNOSIS — Z794 Long term (current) use of insulin: Principal | ICD-10-CM

## 2017-03-09 ENCOUNTER — Telehealth: Payer: Self-pay | Admitting: Endocrinology

## 2017-03-09 ENCOUNTER — Other Ambulatory Visit: Payer: Self-pay

## 2017-03-09 MED ORDER — FREESTYLE LIBRE READER DEVI: 1.0000 | Freq: Every day | 1 refills | Status: DC

## 2017-03-09 MED ORDER — FREESTYLE LITE DEVI: 0 refills | Status: DC

## 2017-03-09 MED ORDER — FREESTYLE LIBRE SENSOR SYSTEM MISC: 2 refills | Status: DC

## 2017-03-09 NOTE — Telephone Encounter (Signed)
Called patient and let her know that I have sent the new meter but she told me that she needed the Freestyle Libre scan meter instead of the regular meter so I am sending this one in.

## 2017-03-09 NOTE — Telephone Encounter (Signed)
Patient wants prescription for the new Meter sent to Pharmacy:  [facility] in [place]. If questions please call patient at cell [phone]

## 2017-03-10 NOTE — Telephone Encounter (Signed)
Can you complete this one? Thank you!

## 2017-03-13 NOTE — Telephone Encounter (Signed)
Will not be able to start this

## 2017-03-16 NOTE — Telephone Encounter (Signed)
Please try to complete this. I have not been able to yet. Thank you!

## 2017-03-18 DIAGNOSIS — F3189 Other bipolar disorder: Secondary | ICD-10-CM | POA: Diagnosis not present

## 2017-03-18 NOTE — Telephone Encounter (Signed)
Has patient signed VG-O form? I have not seen a PA request for this

## 2017-03-24 ENCOUNTER — Telehealth: Payer: Self-pay | Admitting: Endocrinology

## 2017-03-24 NOTE — Telephone Encounter (Signed)
Patient said Dr. Dwyane Dee was supposed to prescribe a new meter (Free Style?) to [facility] in [place]. The pharmacy still does not have the prescription (been waiting since late October). Patient would like prescription to be sent asap.

## 2017-03-25 ENCOUNTER — Other Ambulatory Visit: Payer: Self-pay

## 2017-03-25 MED ORDER — FREESTYLE LIBRE READER DEVI: 1.0000 | Freq: Every day | 1 refills | Status: DC

## 2017-03-25 MED ORDER — FREESTYLE LIBRE SENSOR SYSTEM MISC: 2 refills | Status: DC

## 2017-03-25 NOTE — Telephone Encounter (Signed)
Is this the meter you want her to have? Please advise.

## 2017-03-25 NOTE — Telephone Encounter (Signed)
I d not have any mention of a new meter in my notes.  Please find out why she needs any meter and if there is a  Problem with insurance with the particular brand

## 2017-03-25 NOTE — Telephone Encounter (Signed)
I called the patient and she stated that she wants the [facility]. I have already sent these to her pharmacy and let her know to call her insurance to see if these are covered. She will call us back to let us know.

## 2017-03-29 DIAGNOSIS — M86271 Subacute osteomyelitis, right ankle and foot: Secondary | ICD-10-CM | POA: Diagnosis not present

## 2017-04-01 ENCOUNTER — Telehealth: Payer: Self-pay | Admitting: Endocrinology

## 2017-04-01 NOTE — Telephone Encounter (Signed)
error 

## 2017-04-06 DIAGNOSIS — E113411 Type 2 diabetes mellitus with severe nonproliferative diabetic retinopathy with macular edema, right eye: Secondary | ICD-10-CM | POA: Diagnosis not present

## 2017-04-06 NOTE — Telephone Encounter (Signed)
Dana [name] with VGo and he stated that her copay is $85. Do you want to reach out to patient to set her up for training? Or do you want Gerald Stabs to do it?  Please advise.

## 2017-04-08 ENCOUNTER — Telehealth: Payer: Self-pay | Admitting: Nutrition

## 2017-04-08 NOTE — Patient Instructions (Signed)
Apply and fill the v-go every day Test blood sugars before meals and at bedtime.

## 2017-04-08 NOTE — Telephone Encounter (Signed)
Patient reported that she started on the V-go the day after she saw Dr. Dwyane Dee and Vaughan Basta.  Her FBSs are all less than 150, and acS are occaionally high.  She has had no lows.  Will be seeing Dr. Dwyane Dee next Monday.

## 2017-04-08 NOTE — Progress Notes (Signed)
Pt. Says that she remembers most of what to do, but would like a refresher.  I reviewed with her how to fill, apply and use the v-Go and the need to test blood sugars ac and HS.  She reported good understanding of this with no questions.  She says she will go now and pick up her supplies from the pharmacy and start on it tomorrow.

## 2017-04-14 DIAGNOSIS — E11621 Type 2 diabetes mellitus with foot ulcer: Secondary | ICD-10-CM | POA: Diagnosis not present

## 2017-04-14 DIAGNOSIS — L97522 Non-pressure chronic ulcer of other part of left foot with fat layer exposed: Secondary | ICD-10-CM | POA: Diagnosis not present

## 2017-04-14 DIAGNOSIS — E11610 Type 2 diabetes mellitus with diabetic neuropathic arthropathy: Secondary | ICD-10-CM | POA: Diagnosis not present

## 2017-04-14 DIAGNOSIS — Z89511 Acquired absence of right leg below knee: Secondary | ICD-10-CM | POA: Insufficient documentation

## 2017-04-14 DIAGNOSIS — E1142 Type 2 diabetes mellitus with diabetic polyneuropathy: Secondary | ICD-10-CM | POA: Diagnosis not present

## 2017-04-15 ENCOUNTER — Encounter: Payer: Self-pay | Admitting: Endocrinology

## 2017-04-15 ENCOUNTER — Ambulatory Visit: Payer: PPO | Admitting: Endocrinology

## 2017-04-15 VITALS — BP 114/80 | HR 83 | Ht 60.0 in | Wt 190.0 lb

## 2017-04-15 DIAGNOSIS — E1165 Type 2 diabetes mellitus with hyperglycemia: Secondary | ICD-10-CM

## 2017-04-15 DIAGNOSIS — Z794 Long term (current) use of insulin: Secondary | ICD-10-CM

## 2017-04-15 LAB — BASIC METABOLIC PANEL
BUN: 25 mg/dL — ABNORMAL HIGH (ref 6–23)
CALCIUM: 9 mg/dL (ref 8.4–10.5)
CHLORIDE: 102 meq/L (ref 96–112)
CO2: 28 meq/L (ref 19–32)
Creatinine, Ser: 0.97 mg/dL (ref 0.40–1.20)
GFR: 62.21 mL/min (ref >60.00)
GLUCOSE: 149 mg/dL — AB (ref 70–99)
POTASSIUM: 5.3 meq/L — AB (ref 3.5–5.1)
SODIUM: 137 meq/L (ref 135–145)

## 2017-04-15 NOTE — Patient Instructions (Addendum)
Check to see dose of Metformin   Click 27-12 min before meals  May need 5-6 clicks at dinner if having Cokes

## 2017-04-15 NOTE — Progress Notes (Signed)
Patient ID: Dana [name], female   DOB: January 19, 1956, 61 y.o.   MRN: [id number]           Reason for Appointment: Follow-up for Type 2 Diabetes  Referring physician: Elyn Aquas  History of Present Illness:          Date of diagnosis of type 2 diabetes mellitus: 2006       Background history:   She thinks her blood sugar was 300-400 at the time of diagnosis and she was started on insulin soon after this. She thinks she has been on metformin only for the last 5 years Her A1c history is available since only about 2013 and this had been consistently over 10% She  had  poor control of her diabetes with pre-consultation A1c of 13%, was on Levemir only  Although she did better with the V-go pump 30 in 2016 she stopped this because of increased cost after a short time  Recent history:    Her A1c is recently 7.4, has been as low as 6.7, previously up to 7.8  INSULIN regimen is: V-go pump, 30 unit basal, boluses 6 units at breakfast, 6-8 at lunch and 8-10 at dinner  Current blood sugar patterns, daily management and problems identified:  She has started the V-go pump in late October after review by nurse educator  Previously her blood sugars were mostly high fasting with glucose of 179 average but fluctuating also.  Also was having mostly high readings with an average of 240 after evening meal  She has had no trouble using the V-go pump and changing it daily  She is taking her bolus clicks right before eating  Although she was told to adjust her boluses based on how much she is eating at lunch and dinner she is not able to keep her readings consistent at night after supper; however some of these high readings over 200 may be related to her drinking regular Coke for which she does not compensate with extra insulin  She is having only rare hypoglycemia with only one reading of 63 after lunch  She is going to start her FreeStyle libre sensor tomorrow  She will sometimes eat out at  lunchtime and is able to do her bolus clicks but usually not checking sugars around lunchtime  Highest blood sugars are after supper and not consistently high  FASTING blood sugars are variable but overall mildly increased, better than before  She thinks she is taking 2 tablets of metformin twice a day although her last prescription is for 1000 mg, she does not know what dose she is taking                         Non-insulin hypoglycemic drugs the patient is taking are: Metformin 1g bid Side effects from medications have been: None  Compliance with the medical regimen: Good Hypoglycemia: As above    Glucose monitoring:  done 2-3 times a day         Glucometer:  FreeStyle .      Blood Glucose readings by time of day  Mean values apply above for all meters except median for One Touch  PRE-MEAL Fasting Lunch Dinner Bedtime Overall  Glucose range:  79-240  123   63-213   91-246    Mean/median: 179    150   162     Self-care:  Typical meal intake: Breakfast is irregular otherwise may have toast and an egg or  sausage.  Lunch is a sandwich or cheeseburger, evening meal is meat and 2 vegetables.  For snacks  will have peanut butter crackers in pms Dinner 5-7 pm     She has regular drinks almost every day          Dietician visit, most recent: Never               Exercise: none  Weight history: Stable over the last 3-4 years, was 400 pounds about 5 years ago  Wt Readings from Last 3 Encounters:  04/15/17 190 lb (86.2 kg)  03/04/17 181 lb 6.4 oz (82.3 kg)  01/22/17 178 lb (80.7 kg)    Glycemic control:   Lab Results  Component Value Date   HGBA1C 7.4 03/04/2017   HGBA1C 7.1 (H) 12/02/2016   HGBA1C 6.7 08/13/2016   Lab Results  Component Value Date   MICROALBUR 0.8 10/01/2016   LDLCALC 77 10/01/2016   CREATININE 0.87 01/23/2017       Allergies as of 04/15/2017      Reactions   Keflex [cephalexin]    Tremors, rash, hard to breathe   Lithium Nausea And Vomiting,  Other (See Comments)   Other reaction(s): Other (See Comments) Can not keep this medication down. It makes her terribly ill. Can not keep this medication down. It makes her terribly ill.   Ativan [lorazepam] Other (See Comments)   Other reaction(s): ANAPHYLAXIS Other reaction(s): Other (See Comments) Abnormal behavior Abnormal behavior   Demerol [meperidine] Other (See Comments)   Abnormal behavior (sees things) Other reaction(s): ANAPHYLAXIS Other reaction(s): Other (See Comments) Abnormal behavior Abnormal behavior   Oxycodone Other (See Comments)   Other reaction(s): OTHER Abnormal behavior   Codeine Itching, Other (See Comments)   hallucinations   Doxycycline Other (See Comments)   Severe muscle tremor   Darvocet [propoxyphene N-acetaminophen] Itching   Latex Itching   Other reaction(s): OTHER   Propoxyphene Itching      Medication List        Accurate as of 04/15/17  4:09 PM. Always use your most recent med list.          aspirin 325 MG EC tablet Take 325 mg by mouth daily.   atorvastatin 20 MG tablet Commonly known as:  LIPITOR Take 1 tablet (20 mg total) by mouth at bedtime.   budesonide-formoterol 80-4.5 MCG/ACT inhaler Commonly known as:  SYMBICORT Inhale 2 puffs into the lungs 2 (two) times daily.   escitalopram 5 MG tablet Commonly known as:  LEXAPRO Take 5 mg by mouth 3 (three) times daily.   fluticasone 50 MCG/ACT nasal spray Commonly known as:  FLONASE USE 2 SPRAY(S) IN EACH NOSTRIL ONCE DAILY   freestyle lancets Use as instructed to check blood sugar 3 times per day dx code E11.39   FREESTYLE LIBRE READER Devi 1 Device daily by Does not apply route. Dx Code E11.39   FREESTYLE LIBRE SENSOR SYSTEM Misc Use on sensor for 10 days to check blood sugar levels. Dx Code E11.39   FREESTYLE LITE Devi Use to check blood sugar 3 times a day dx code E11.39   FREESTYLE LITE test strip Generic drug:  glucose blood USE AS DIRECTED TO CHECK BLOOD  SUGAR THREE TIMES DAILY   gabapentin 600 MG tablet Commonly known as:  NEURONTIN TAKE 2 TABLETS BY MOUTH THREE TIMES DAILY   insulin detemir 100 UNIT/ML injection Commonly known as:  LEVEMIR Inject 35 units in the skin daily at 10pm   insulin regular  100 units/mL injection Commonly known as:  NOVOLIN R RELION Take 6 units with breakfast, 8 units with lunch and 8 units with dinner   INSULIN SYRINGE 1CC/29G 29G X 1/2" 1 ML Misc Use 1 per day.   levofloxacin 750 MG tablet Commonly known as:  LEVAQUIN Take 1 tablet (750 mg total) by mouth daily.   metFORMIN 1000 MG tablet Commonly known as:  GLUCOPHAGE Take 1 tablet (1,000 mg total) by mouth 2 (two) times daily with a meal.   OVER THE COUNTER MEDICATION Take 2 each by mouth daily. Centrum-Muli. Vitamin Gummy   pantoprazole 40 MG tablet Commonly known as:  PROTONIX Take 1 tablet (40 mg total) by mouth daily.   V-GO 30 Kit 1 Pump by Does not apply route daily.       Allergies:  Allergies  Allergen Reactions  . Keflex [Cephalexin]     Tremors, rash, hard to breathe  . Lithium Nausea And Vomiting and Other (See Comments)    Other reaction(s): Other (See Comments) Can not keep this medication down. It makes her terribly ill. Can not keep this medication down. It makes her terribly ill.  . Ativan [Lorazepam] Other (See Comments)    Other reaction(s): ANAPHYLAXIS Other reaction(s): Other (See Comments) Abnormal behavior Abnormal behavior  . Demerol [Meperidine] Other (See Comments)    Abnormal behavior (sees things) Other reaction(s): ANAPHYLAXIS Other reaction(s): Other (See Comments) Abnormal behavior Abnormal behavior  . Oxycodone Other (See Comments)    Other reaction(s): OTHER Abnormal behavior  . Codeine Itching and Other (See Comments)    hallucinations  . Doxycycline Other (See Comments)    Severe muscle tremor  . Darvocet [Propoxyphene N-Acetaminophen] Itching  . Latex Itching    Other reaction(s):  OTHER  . Propoxyphene Itching    Past Medical History:  Diagnosis Date  . Bipolar 1 disorder ([place])   . Cellulitis and abscess of foot 03/08/2015  . Diabetes mellitus    INSULIN DEPENDENT  . Diabetic neuropathy ([place])   . Diabetic neuropathy ([place])   . 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 H/O hiatal hernia   . Hyperlipidemia   . Hypertension    past hx of  . Iron malabsorption 09/26/2015  . Numbness and tingling    Hx; of in B/LLE and B/LUE  . Other iron deficiency anemias 09/20/2015    Past Surgical History:  Procedure Laterality Date  . ABDOMINAL HYSTERECTOMY    . ADENOIDECTOMY     Hx: of  . AMPUTATION  10/24/2011   Procedure: AMPUTATION RAY;  Surgeon: Newt Minion, MD;  Location: [place];  Service: Orthopedics;  Laterality: Right;  Right Foot 3rd Ray Amputation  . AMPUTATION Right 12/16/2012   Procedure: Right Foot Transmetatarsal Amputation;  Surgeon: Newt Minion, MD;  Location: [place];  Service: Orthopedics;  Laterality: Right;  . AMPUTATION Left 03/09/2015   Procedure: LEFT FOOT 1ST RAY AMPUTATION;  Surgeon: Newt Minion, MD;  Location: [place];  Service: Orthopedics;  Laterality: Left;  . BLADDER SURGERY     x 2, tacked 1st time; mesh "eroded", had to be removed  . Bladder Tact   2002  . BREAST SURGERY Left    "knot removed"  . CARPAL TUNNEL RELEASE Left   . COLON SURGERY    . NASAL SEPTUM SURGERY  1976  . TONSILLECTOMY     age 89's    Family History  Problem Relation Age of Onset  . Diabetes Mother   . Mental illness  Mother   . Bipolar disorder Mother   . Hypertension Father   . Diabetes Maternal Grandmother   . Heart disease Neg Hx     Social History:  reports that  has never smoked. she has never used smokeless tobacco. She reports that she does not drink alcohol or use drugs.    Review of Systems   Has diabetic neuropathy with sensory loss Taking  634m gabapentin and analgesics from PCP  DIABETIC foot ulcers with history of amputations: She  is being followed [facility]   On the right leg she has a prosthesis after her BKA   Lipid history: On treatment with Lipitor and followed by PCP    Lab Results  Component Value Date   CHOL 128 01/20/2017   HDL 40.10 10/01/2016   LDLCALC 77 10/01/2016   TRIG 175.0 (H) 10/01/2016   CHOLHDL 4 10/01/2016             Physical Examination:  BP 114/80   Pulse 83   Ht 5' (1.524 m)   Wt 190 lb (86.2 kg)   SpO2 98%   BMI 37.11 kg/m        ASSESSMENT:  Diabetes type 2, uncontrolled     See history of present illness for detailed discussion of  current management, blood sugar patterns and problems identified  Her blood sugars are improved with going back to the V-go pump With this her fasting blood sugars are relatively better and not is inconsistent Also is able to get adequate control of her postprandial readings after supper with about the same or less insulin Variability in her blood sugars related to her diet Also with using regular insulin she is probably not getting consistent control especially with bolusing right before eating   Recommendations:  She needs to check her dose of metformin at home and make sure she is not taking more than 1000 mg twice a day  She will try to bolus 30 minutes before eating  She will need to take an extra click if she is planning to drink a regular soft drinks  More blood sugars after breakfast and lunch  Discussed the need for getting information from the FreeStyle libre sensor consistently throughout the day with some readings at least within 8 hours of each other  She will talk to the nurse educator about day-to-day management of the freestyle Libre sensor  Probably needs a little more bolus at lunchtime when eating out but also if eating a larger meal in the evening  She will call if she has any tendency to hypoglycemia  Encouraged her to start walking since she now has a prosthesis    Patient Instructions    Check to see dose of Metformin     Counseling time on subjects discussed in assessment and plan sections is over 50% of today's 25 minute visit    Lachrista Heslin 04/15/2017, 4:09 PM   Note: This office note was prepared with Dragon voice recognition system technology. Any transcriptional errors that result from this process are unintentional.

## 2017-04-16 LAB — FRUCTOSAMINE: Fructosamine: 275 umol/L (ref 0–285)

## 2017-04-17 DIAGNOSIS — Z79899 Other long term (current) drug therapy: Secondary | ICD-10-CM | POA: Diagnosis not present

## 2017-04-17 DIAGNOSIS — F3189 Other bipolar disorder: Secondary | ICD-10-CM | POA: Diagnosis not present

## 2017-04-17 DIAGNOSIS — F319 Bipolar disorder, unspecified: Secondary | ICD-10-CM | POA: Diagnosis not present

## 2017-04-22 NOTE — Telephone Encounter (Signed)
Opened in error

## 2017-04-23 ENCOUNTER — Telehealth: Payer: Self-pay

## 2017-04-23 NOTE — Telephone Encounter (Signed)
-----   Message from Brunetta Jeans, PA-C sent at 04/17/2017  4:54 PM EST ----- See note. Can we make sure patient gets a follow-up here in 1-2 weeks to recheck her potassium level? Thank you.   ----- Message ----- From: Elayne Snare, MD Sent: 04/16/2017   8:49 AM To: Brunetta Jeans, PA-C, Anibal Henderson, CMA  Potassium is high: make  she is not taking potassium supplement, using salt  substitutes, need to restrict foods like bananas, oranges, baked potatoes and a lot of dairy products, to follow-up with PCP regarding potassium

## 2017-04-23 NOTE — Telephone Encounter (Signed)
Spoke with patient, advised of dietary restrictions. Patient verbalized understanding, follow up appointment scheduled with PCP for 05/01/17.

## 2017-04-28 DIAGNOSIS — L97522 Non-pressure chronic ulcer of other part of left foot with fat layer exposed: Secondary | ICD-10-CM | POA: Diagnosis not present

## 2017-04-28 DIAGNOSIS — E11610 Type 2 diabetes mellitus with diabetic neuropathic arthropathy: Secondary | ICD-10-CM | POA: Diagnosis not present

## 2017-04-28 DIAGNOSIS — Z89432 Acquired absence of left foot: Secondary | ICD-10-CM | POA: Insufficient documentation

## 2017-04-28 DIAGNOSIS — M86271 Subacute osteomyelitis, right ankle and foot: Secondary | ICD-10-CM | POA: Diagnosis not present

## 2017-04-28 DIAGNOSIS — E1142 Type 2 diabetes mellitus with diabetic polyneuropathy: Secondary | ICD-10-CM | POA: Diagnosis not present

## 2017-04-28 DIAGNOSIS — E11621 Type 2 diabetes mellitus with foot ulcer: Secondary | ICD-10-CM | POA: Diagnosis not present

## 2017-04-30 ENCOUNTER — Other Ambulatory Visit: Payer: Self-pay | Admitting: Endocrinology

## 2017-04-30 ENCOUNTER — Telehealth: Payer: Self-pay | Admitting: Physician Assistant

## 2017-04-30 DIAGNOSIS — Z794 Long term (current) use of insulin: Principal | ICD-10-CM

## 2017-04-30 DIAGNOSIS — E11319 Type 2 diabetes mellitus with unspecified diabetic retinopathy without macular edema: Secondary | ICD-10-CM

## 2017-04-30 DIAGNOSIS — Z7689 Persons encountering health services in other specified circumstances: Principal | ICD-10-CM

## 2017-04-30 DIAGNOSIS — Z0189 Encounter for other specified special examinations: Secondary | ICD-10-CM

## 2017-04-30 DIAGNOSIS — IMO0002 Reserved for concepts with insufficient information to code with codable children: Secondary | ICD-10-CM

## 2017-04-30 DIAGNOSIS — E1165 Type 2 diabetes mellitus with hyperglycemia: Principal | ICD-10-CM

## 2017-04-30 NOTE — Addendum Note (Signed)
Addended by: Katina Dung on: 04/30/2017 11:09 AM   Modules accepted: Orders

## 2017-04-30 NOTE — Telephone Encounter (Signed)
Ok with me 

## 2017-04-30 NOTE — Telephone Encounter (Signed)
Referral has been placed. Patient has been made aware.  °

## 2017-04-30 NOTE — Telephone Encounter (Signed)
Okay for referral?

## 2017-04-30 NOTE — Telephone Encounter (Signed)
Copied from [place] [id number]. Topic: Referral - Request >> Apr 30, 2017  9:13 AM Robina Ade, Helene Kelp D wrote: Reason for CRM: Patient would like a referral to go and see PT at [facility] with [place] [facility] and their number is [phone].

## 2017-05-01 ENCOUNTER — Ambulatory Visit: Payer: PPO | Admitting: Physician Assistant

## 2017-05-13 ENCOUNTER — Other Ambulatory Visit: Payer: Self-pay | Admitting: Physician Assistant

## 2017-05-13 ENCOUNTER — Ambulatory Visit: Payer: PPO | Admitting: Physician Assistant

## 2017-05-13 DIAGNOSIS — K219 Gastro-esophageal reflux disease without esophagitis: Secondary | ICD-10-CM

## 2017-05-14 DIAGNOSIS — E113412 Type 2 diabetes mellitus with severe nonproliferative diabetic retinopathy with macular edema, left eye: Secondary | ICD-10-CM | POA: Diagnosis not present

## 2017-05-19 DIAGNOSIS — E113411 Type 2 diabetes mellitus with severe nonproliferative diabetic retinopathy with macular edema, right eye: Secondary | ICD-10-CM | POA: Diagnosis not present

## 2017-05-20 ENCOUNTER — Encounter: Payer: Self-pay | Admitting: Physician Assistant

## 2017-05-20 ENCOUNTER — Ambulatory Visit (INDEPENDENT_AMBULATORY_CARE_PROVIDER_SITE_OTHER): Payer: PPO | Admitting: Physician Assistant

## 2017-05-20 VITALS — BP 138/80 | HR 73 | Temp 97.8°F | Resp 14 | Ht 60.0 in | Wt 193.0 lb

## 2017-05-20 DIAGNOSIS — E875 Hyperkalemia: Secondary | ICD-10-CM

## 2017-05-20 LAB — BASIC METABOLIC PANEL
BUN: 22 mg/dL (ref 6–23)
CHLORIDE: 103 meq/L (ref 96–112)
CO2: 29 meq/L (ref 19–32)
Calcium: 9.2 mg/dL (ref 8.4–10.5)
Creatinine, Ser: 0.91 mg/dL (ref 0.40–1.20)
GFR: 66.95 mL/min (ref >60.00)
GLUCOSE: 267 mg/dL — AB (ref 70–99)
POTASSIUM: 5.7 meq/L — AB (ref 3.5–5.1)
SODIUM: 138 meq/L (ref 135–145)

## 2017-05-20 NOTE — Patient Instructions (Signed)
Please stay well-hydrated. Continue current regimen. Start a daily B complex mulitvitamin or NeuroEase formula OTC. If not improving, please let me know and will will need to change medication from Gabapentin to a trial of Lyrica.

## 2017-05-20 NOTE — Progress Notes (Signed)
Patient presents to clinic today for follow-up of hyperkalemia noted on recent labs with Endocrinology. Last potassium at 5.3. Was instructed to limit salt substitutes, limit foods high in potassium and work on hydration. Patient is following these directions. Denies any palpitations, lightheadedness or dizziness. Is doing very well overall. Is in need of forms to get a handicap placard. Patient is s/p R BKA.   Past Medical History:  Diagnosis Date  . Bipolar 1 disorder ([place])   . Cellulitis and abscess of foot 03/08/2015  . Diabetes mellitus    INSULIN DEPENDENT  . Diabetic neuropathy ([place])   . Diabetic neuropathy ([place])   . 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 H/O hiatal hernia   . Hyperlipidemia   . Hypertension    past hx of  . Iron malabsorption 09/26/2015  . Numbness and tingling    Hx; of in B/LLE and B/LUE  . Other iron deficiency anemias 09/20/2015    Current Outpatient Medications on File Prior to Visit  Medication Sig Dispense Refill  . aspirin 325 MG EC tablet Take 325 mg by mouth daily.    Marland Kitchen atorvastatin (LIPITOR) 20 MG tablet Take 1 tablet (20 mg total) by mouth at bedtime. 90 tablet 1  . Blood Glucose Monitoring Suppl (FREESTYLE LITE) DEVI Use to check blood sugar 3 times a day dx code E11.39 1 each 0  . budesonide-formoterol (SYMBICORT) 80-4.5 MCG/ACT inhaler Inhale 2 puffs into the lungs 2 (two) times daily. 1 Inhaler 3  . Continuous Blood Gluc Receiver (FREESTYLE LIBRE READER) DEVI 1 Device daily by Does not apply route. Dx Code E11.39 1 Device 1  . Continuous Blood Gluc Sensor (FREESTYLE LIBRE SENSOR SYSTEM) MISC Use on sensor for 10 days to check blood sugar levels. Dx Code E11.39 3 each 2  . escitalopram (LEXAPRO) 5 MG tablet Take 5 mg by mouth 3 (three) times daily.     . fluticasone (FLONASE) 50 MCG/ACT nasal spray USE 2 SPRAY(S) IN EACH NOSTRIL ONCE DAILY 16 g 2  . FREESTYLE LITE test strip USE AS DIRECTED TO CHECK BLOOD SUGAR THREE TIMES DAILY 100  each 3  . gabapentin (NEURONTIN) 600 MG tablet TAKE 2 TABLETS BY MOUTH THREE TIMES DAILY (Patient taking differently: TAKE 1200 mg  BY MOUTH THREE TIMES DAILY) 540 tablet 1  . Insulin Disposable Pump (V-GO 30) KIT 1 Pump by Does not apply route daily. 1 kit 2  . insulin regular (NOVOLIN R RELION) 100 units/mL injection Take 6 units with breakfast, 8 units with lunch and 8 units with dinner (Patient taking differently: Inject 4-10 Units into the skin 3 (three) times daily before meals. Per meal size, small meals = less pumps to Vgo and large meal = more pumps to Vgo. 1 pump = 2 units.) 10 mL 3  . INSULIN SYRINGE 1CC/29G 29G X 1/2" 1 ML MISC Use 1 per day. 100 each 2  . Lancets (FREESTYLE) lancets Use as instructed to check blood sugar 3 times per day dx code E11.39 100 each 3  . metFORMIN (GLUCOPHAGE) 500 MG tablet TAKE 2 TABLETS BY MOUTH IN THE MORNING AND 1 IN THE EVENING 270 tablet 1  . OVER THE COUNTER MEDICATION Take 2 each by mouth daily. Centrum-Muli. Vitamin Gummy    . pantoprazole (PROTONIX) 40 MG tablet TAKE 1 TABLET BY MOUTH ONCE DAILY 90 tablet 1   No current facility-administered medications on file prior to visit.     Allergies  Allergen Reactions  . Keflex [  Cephalexin]     Tremors, rash, hard to breathe  . Lithium Nausea And Vomiting and Other (See Comments)    Other reaction(s): Other (See Comments) Can not keep this medication down. It makes her terribly ill. Can not keep this medication down. It makes her terribly ill.  . Ativan [Lorazepam] Other (See Comments)    Other reaction(s): ANAPHYLAXIS Other reaction(s): Other (See Comments) Abnormal behavior Abnormal behavior  . Demerol [Meperidine] Other (See Comments)    Abnormal behavior (sees things) Other reaction(s): ANAPHYLAXIS Other reaction(s): Other (See Comments) Abnormal behavior Abnormal behavior  . Oxycodone Other (See Comments)    Other reaction(s): OTHER Abnormal behavior  . Codeine Itching and Other (See  Comments)    hallucinations  . Doxycycline Other (See Comments)    Severe muscle tremor  . Darvocet [Propoxyphene N-Acetaminophen] Itching  . Latex Itching    Other reaction(s): OTHER  . Propoxyphene Itching    Family History  Problem Relation Age of Onset  . Diabetes Mother   . Mental illness Mother   . Bipolar disorder Mother   . Hypertension Father   . Diabetes Maternal Grandmother   . Heart disease Neg Hx     Social History   Socioeconomic History  . Marital status: Married    Spouse name: None  . Number of children: 2  . Years of education: 103  . Highest education level: None  Social Needs  . Financial resource strain: None  . Food insecurity - worry: None  . Food insecurity - inability: None  . Transportation needs - medical: None  . Transportation needs - non-medical: None  Occupational History    Comment: disabled  Tobacco Use  . Smoking status: Never Smoker  . Smokeless tobacco: Never Used  Substance and Sexual Activity  . Alcohol use: No    Alcohol/week: 0.0 oz  . Drug use: No  . Sexual activity: No    Partners: Male  Other Topics Concern  . None  Social History Narrative   Lives with husband in home   Caffeine use - Coke 2 or 3 16 oz daily   Review of Systems - See HPI.  All other ROS are negative.  BP 138/80   Pulse 73   Temp 97.8 F (36.6 C) (Oral)   Resp 14   Ht 5' (1.524 m)   Wt 193 lb (87.5 kg)   SpO2 96%   BMI 37.69 kg/m   Physical Exam  Constitutional: She is oriented to person, place, and time and well-developed, well-nourished, and in no distress.  HENT:  Head: Normocephalic and atraumatic.  Cardiovascular: Normal rate, regular rhythm, normal heart sounds and intact distal pulses.  Pulmonary/Chest: Effort normal and breath sounds normal. No respiratory distress. She has no wheezes. She has no rales. She exhibits no tenderness.  Neurological: She is alert and oriented to person, place, and time.  Skin: Skin is warm and dry. No  rash noted.  Psychiatric: Affect normal.  Vitals reviewed.   Recent Results (from the past 2160 hour(s))  POCT glycosylated hemoglobin (Hb A1C)     Status: None   Collection Time: 03/04/17  5:54 PM  Result Value Ref Range   Hemoglobin A1C 7.4   Fructosamine     Status: None   Collection Time: 04/15/17  4:19 PM  Result Value Ref Range   Fructosamine 275 0 - 285 umol/L    Comment: Published reference interval for apparently healthy subjects between age 23 and 17 is 24 - 53  umol/L and in a poorly controlled diabetic population is 228 - 563 umol/L with a mean of 396 umol/L.   Basic metabolic panel     Status: Abnormal   Collection Time: 04/15/17  4:19 PM  Result Value Ref Range   Sodium 137 135 - 145 mEq/L   Potassium 5.3 (H) 3.5 - 5.1 mEq/L   Chloride 102 96 - 112 mEq/L   CO2 28 19 - 32 mEq/L   Glucose, Bld 149 (H) 70 - 99 mg/dL   BUN 25 (H) 6 - 23 mg/dL   Creatinine, Ser 0.97 0.40 - 1.20 mg/dL   Calcium 9.0 8.4 - 10.5 mg/dL   GFR 62.21 >60.00 mL/min    Assessment/Plan: 1. Hyperkalemia Repeat labs today to further assess. Will alter regimen accordingly.  - Basic metabolic panel   [name], PA-C

## 2017-05-21 ENCOUNTER — Other Ambulatory Visit: Payer: Self-pay | Admitting: Physician Assistant

## 2017-05-21 DIAGNOSIS — R2681 Unsteadiness on feet: Secondary | ICD-10-CM | POA: Diagnosis not present

## 2017-05-21 DIAGNOSIS — Z89511 Acquired absence of right leg below knee: Secondary | ICD-10-CM | POA: Diagnosis not present

## 2017-05-21 DIAGNOSIS — E875 Hyperkalemia: Secondary | ICD-10-CM

## 2017-05-21 DIAGNOSIS — Z9181 History of falling: Secondary | ICD-10-CM | POA: Diagnosis not present

## 2017-05-21 DIAGNOSIS — Z4781 Encounter for orthopedic aftercare following surgical amputation: Secondary | ICD-10-CM | POA: Diagnosis not present

## 2017-05-21 MED ORDER — HYDROCHLOROTHIAZIDE 12.5 MG PO CAPS: 12.5000 mg | ORAL_CAPSULE | Freq: Every day | ORAL | 0 refills | Status: DC

## 2017-05-22 ENCOUNTER — Telehealth: Payer: Self-pay | Admitting: Emergency Medicine

## 2017-05-22 DIAGNOSIS — F3189 Other bipolar disorder: Secondary | ICD-10-CM | POA: Diagnosis not present

## 2017-05-22 NOTE — Telephone Encounter (Signed)
error 

## 2017-05-22 NOTE — Telephone Encounter (Signed)
Spoke with patient and she wanted to know what was causing the elevated potassium. Advised the HCTZ will help with edema and potassium level. She is agreeable  Copied from [place] [phone]. Topic: Quick Communication - Lab Results >> May 21, 2017 11:28 AM Leonidas Romberg, CMA wrote: Called patient to inform them of lab results. When patient returns call, triage nurse may disclose results. >> May 21, 2017  4:19 PM Robina Ade, Helene Kelp D wrote: Patient called back and would like to talk to someone about her labs in particular her potassium levels. Please call patient back, thanks.

## 2017-05-26 DIAGNOSIS — Z89511 Acquired absence of right leg below knee: Secondary | ICD-10-CM | POA: Diagnosis not present

## 2017-05-26 DIAGNOSIS — L97812 Non-pressure chronic ulcer of other part of right lower leg with fat layer exposed: Secondary | ICD-10-CM | POA: Diagnosis not present

## 2017-05-26 DIAGNOSIS — E11621 Type 2 diabetes mellitus with foot ulcer: Secondary | ICD-10-CM | POA: Diagnosis not present

## 2017-05-26 DIAGNOSIS — E1142 Type 2 diabetes mellitus with diabetic polyneuropathy: Secondary | ICD-10-CM | POA: Diagnosis not present

## 2017-05-26 DIAGNOSIS — E11610 Type 2 diabetes mellitus with diabetic neuropathic arthropathy: Secondary | ICD-10-CM | POA: Diagnosis not present

## 2017-06-03 DIAGNOSIS — B372 Candidiasis of skin and nail: Secondary | ICD-10-CM | POA: Diagnosis not present

## 2017-06-03 DIAGNOSIS — L300 Nummular dermatitis: Secondary | ICD-10-CM | POA: Diagnosis not present

## 2017-06-09 ENCOUNTER — Other Ambulatory Visit: Payer: PPO

## 2017-06-09 ENCOUNTER — Other Ambulatory Visit (INDEPENDENT_AMBULATORY_CARE_PROVIDER_SITE_OTHER): Payer: PPO

## 2017-06-09 DIAGNOSIS — E875 Hyperkalemia: Secondary | ICD-10-CM | POA: Diagnosis not present

## 2017-06-09 LAB — BASIC METABOLIC PANEL
BUN: 29 mg/dL — AB (ref 6–23)
CO2: 31 mEq/L (ref 19–32)
CREATININE: 1.15 mg/dL (ref 0.40–1.20)
Calcium: 9.4 mg/dL (ref 8.4–10.5)
Chloride: 102 mEq/L (ref 96–112)
GFR: 51.09 mL/min — AB (ref >60.00)
Glucose, Bld: 146 mg/dL — ABNORMAL HIGH (ref 70–99)
POTASSIUM: 5.4 meq/L — AB (ref 3.5–5.1)
Sodium: 139 mEq/L (ref 135–145)

## 2017-06-11 ENCOUNTER — Other Ambulatory Visit: Payer: Self-pay | Admitting: Physician Assistant

## 2017-06-11 DIAGNOSIS — E875 Hyperkalemia: Secondary | ICD-10-CM

## 2017-06-16 ENCOUNTER — Other Ambulatory Visit: Payer: Self-pay

## 2017-06-16 ENCOUNTER — Ambulatory Visit (INDEPENDENT_AMBULATORY_CARE_PROVIDER_SITE_OTHER): Payer: PPO | Admitting: Physician Assistant

## 2017-06-16 ENCOUNTER — Encounter: Payer: Self-pay | Admitting: Physician Assistant

## 2017-06-16 VITALS — BP 116/70 | HR 82 | Temp 97.9°F | Resp 14 | Ht 60.0 in | Wt 193.0 lb

## 2017-06-16 DIAGNOSIS — Z9181 History of falling: Secondary | ICD-10-CM | POA: Diagnosis not present

## 2017-06-16 DIAGNOSIS — B9689 Other specified bacterial agents as the cause of diseases classified elsewhere: Secondary | ICD-10-CM

## 2017-06-16 DIAGNOSIS — Z4781 Encounter for orthopedic aftercare following surgical amputation: Secondary | ICD-10-CM | POA: Diagnosis not present

## 2017-06-16 DIAGNOSIS — J208 Acute bronchitis due to other specified organisms: Secondary | ICD-10-CM

## 2017-06-16 DIAGNOSIS — Z89511 Acquired absence of right leg below knee: Secondary | ICD-10-CM | POA: Diagnosis not present

## 2017-06-16 DIAGNOSIS — R2681 Unsteadiness on feet: Secondary | ICD-10-CM | POA: Diagnosis not present

## 2017-06-16 MED ORDER — AZITHROMYCIN 250 MG PO TABS: ORAL_TABLET | ORAL | 0 refills | Status: DC

## 2017-06-16 MED ORDER — BENZONATATE 100 MG PO CAPS: 100.0000 mg | ORAL_CAPSULE | Freq: Three times a day (TID) | ORAL | 0 refills | Status: DC | PRN

## 2017-06-16 NOTE — Progress Notes (Signed)
Patient presents to clinic today c/o 6 days of progressively worsening chest congestion and productive cough. Notes some mild nasal congestion. Denies sinus pressure, sinus pain, fever, chills or aches. Has taken OTC cough medications, cough drops and has been trying to hydrate well.   Past Medical History:  Diagnosis Date  . Bipolar 1 disorder ([place])   . Cellulitis and abscess of foot 03/08/2015  . Diabetes mellitus    INSULIN DEPENDENT  . Diabetic neuropathy ([place])   . Diabetic neuropathy ([place])   . 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 H/O hiatal hernia   . Hyperlipidemia   . Hypertension    past hx of  . Iron malabsorption 09/26/2015  . Numbness and tingling    Hx; of in B/LLE and B/LUE  . Other iron deficiency anemias 09/20/2015    Current Outpatient Medications on File Prior to Visit  Medication Sig Dispense Refill  . aspirin 325 MG EC tablet Take 325 mg by mouth daily.    Marland Kitchen atorvastatin (LIPITOR) 20 MG tablet Take 1 tablet (20 mg total) by mouth at bedtime. 90 tablet 1  . Blood Glucose Monitoring Suppl (FREESTYLE LITE) DEVI Use to check blood sugar 3 times a day dx code E11.39 1 each 0  . budesonide-formoterol (SYMBICORT) 80-4.5 MCG/ACT inhaler Inhale 2 puffs into the lungs 2 (two) times daily. 1 Inhaler 3  . Continuous Blood Gluc Receiver (FREESTYLE LIBRE READER) DEVI 1 Device daily by Does not apply route. Dx Code E11.39 1 Device 1  . Continuous Blood Gluc Sensor (FREESTYLE LIBRE SENSOR SYSTEM) MISC Use on sensor for 10 days to check blood sugar levels. Dx Code E11.39 3 each 2  . escitalopram (LEXAPRO) 5 MG tablet Take 5 mg by mouth 3 (three) times daily.     . fluticasone (FLONASE) 50 MCG/ACT nasal spray USE 2 SPRAY(S) IN EACH NOSTRIL ONCE DAILY 16 g 2  . FREESTYLE LITE test strip USE AS DIRECTED TO CHECK BLOOD SUGAR THREE TIMES DAILY 100 each 3  . gabapentin (NEURONTIN) 600 MG tablet TAKE 2 TABLETS BY MOUTH THREE TIMES DAILY (Patient taking differently: TAKE 1200 mg   BY MOUTH THREE TIMES DAILY) 540 tablet 1  . hydrochlorothiazide (MICROZIDE) 12.5 MG capsule Take 1 capsule (12.5 mg total) by mouth daily. 30 capsule 0  . Insulin Disposable Pump (V-GO 30) KIT 1 Pump by Does not apply route daily. 1 kit 2  . insulin regular (NOVOLIN R RELION) 100 units/mL injection Take 6 units with breakfast, 8 units with lunch and 8 units with dinner (Patient taking differently: Inject 4-10 Units into the skin 3 (three) times daily before meals. Per meal size, small meals = less pumps to Vgo and large meal = more pumps to Vgo. 1 pump = 2 units.) 10 mL 3  . INSULIN SYRINGE 1CC/29G 29G X 1/2" 1 ML MISC Use 1 per day. 100 each 2  . Lancets (FREESTYLE) lancets Use as instructed to check blood sugar 3 times per day dx code E11.39 100 each 3  . metFORMIN (GLUCOPHAGE) 500 MG tablet TAKE 2 TABLETS BY MOUTH IN THE MORNING AND 1 IN THE EVENING 270 tablet 1  . OVER THE COUNTER MEDICATION Take 2 each by mouth daily. Centrum-Muli. Vitamin Gummy    . pantoprazole (PROTONIX) 40 MG tablet TAKE 1 TABLET BY MOUTH ONCE DAILY 90 tablet 1   No current facility-administered medications on file prior to visit.     Allergies  Allergen Reactions  . Keflex [Cephalexin]  Tremors, rash, hard to breathe  . Lithium Nausea And Vomiting and Other (See Comments)    Other reaction(s): Other (See Comments) Can not keep this medication down. It makes her terribly ill. Can not keep this medication down. It makes her terribly ill.  . Ativan [Lorazepam] Other (See Comments)    Other reaction(s): ANAPHYLAXIS Other reaction(s): Other (See Comments) Abnormal behavior Abnormal behavior  . Demerol [Meperidine] Other (See Comments)    Abnormal behavior (sees things) Other reaction(s): ANAPHYLAXIS Other reaction(s): Other (See Comments) Abnormal behavior Abnormal behavior  . Oxycodone Other (See Comments)    Other reaction(s): OTHER Abnormal behavior  . Codeine Itching and Other (See Comments)     hallucinations  . Doxycycline Other (See Comments)    Severe muscle tremor  . Darvocet [Propoxyphene N-Acetaminophen] Itching  . Latex Itching    Other reaction(s): OTHER  . Propoxyphene Itching    Family History  Problem Relation Age of Onset  . Diabetes Mother   . Mental illness Mother   . Bipolar disorder Mother   . Hypertension Father   . Diabetes Maternal Grandmother   . Heart disease Neg Hx     Social History   Socioeconomic History  . Marital status: Married    Spouse name: None  . Number of children: 2  . Years of education: 31  . Highest education level: None  Social Needs  . Financial resource strain: None  . Food insecurity - worry: None  . Food insecurity - inability: None  . Transportation needs - medical: None  . Transportation needs - non-medical: None  Occupational History    Comment: disabled  Tobacco Use  . Smoking status: Never Smoker  . Smokeless tobacco: Never Used  Substance and Sexual Activity  . Alcohol use: No    Alcohol/week: 0.0 oz  . Drug use: No  . Sexual activity: No    Partners: Male  Other Topics Concern  . None  Social History Narrative   Lives with husband in home   Caffeine use - Coke 2 or 3 16 oz daily   Review of Systems - See HPI.  All other ROS are negative.  BP 116/70   Pulse 82   Temp 97.9 F (36.6 C) (Oral)   Resp 14   Ht 5' (1.524 m)   Wt 193 lb (87.5 kg)   SpO2 96%   BMI 37.69 kg/m   Physical Exam  Constitutional: She is oriented to person, place, and time and well-developed, well-nourished, and in no distress.  HENT:  Head: Normocephalic and atraumatic.  Right Ear: External ear normal.  Left Ear: External ear normal.  Nose: Nose normal.  Mouth/Throat: Oropharynx is clear and moist. No oropharyngeal exudate.  TM within normal limits bilaterally.  Eyes: Conjunctivae are normal.  Neck: Neck supple.  Cardiovascular: Normal rate, regular rhythm, normal heart sounds and intact distal pulses.    Pulmonary/Chest: Effort normal and breath sounds normal. No respiratory distress. She has no wheezes. She has no rales. She exhibits no tenderness.  Lymphadenopathy:    She has no cervical adenopathy.  Neurological: She is alert and oriented to person, place, and time.  Skin: Skin is warm and dry. No rash noted.  Psychiatric: Affect normal.  Vitals reviewed.   Recent Results (from the past 2160 hour(s))  Fructosamine     Status: None   Collection Time: 04/15/17  4:19 PM  Result Value Ref Range   Fructosamine 275 0 - 285 umol/L    Comment:  Published reference interval for apparently healthy subjects between age 15 and 24 is 14 - 285 umol/L and in a poorly controlled diabetic population is 228 - 563 umol/L with a mean of 396 umol/L.   Basic metabolic panel     Status: Abnormal   Collection Time: 04/15/17  4:19 PM  Result Value Ref Range   Sodium 137 135 - 145 mEq/L   Potassium 5.3 (H) 3.5 - 5.1 mEq/L   Chloride 102 96 - 112 mEq/L   CO2 28 19 - 32 mEq/L   Glucose, Bld 149 (H) 70 - 99 mg/dL   BUN 25 (H) 6 - 23 mg/dL   Creatinine, Ser 0.97 0.40 - 1.20 mg/dL   Calcium 9.0 8.4 - 10.5 mg/dL   GFR 62.21 >60.00 mL/min  Basic metabolic panel     Status: Abnormal   Collection Time: 05/20/17 11:52 AM  Result Value Ref Range   Sodium 138 135 - 145 mEq/L   Potassium 5.7 (H) 3.5 - 5.1 mEq/L   Chloride 103 96 - 112 mEq/L   CO2 29 19 - 32 mEq/L   Glucose, Bld 267 (H) 70 - 99 mg/dL   BUN 22 6 - 23 mg/dL   Creatinine, Ser 0.91 0.40 - 1.20 mg/dL   Calcium 9.2 8.4 - 10.5 mg/dL   GFR 66.95 >60.00 mL/min  Basic metabolic panel     Status: Abnormal   Collection Time: 06/09/17  3:03 PM  Result Value Ref Range   Sodium 139 135 - 145 mEq/L   Potassium 5.4 (H) 3.5 - 5.1 mEq/L   Chloride 102 96 - 112 mEq/L   CO2 31 19 - 32 mEq/L   Glucose, Bld 146 (H) 70 - 99 mg/dL   BUN 29 (H) 6 - 23 mg/dL   Creatinine, Ser 1.15 0.40 - 1.20 mg/dL   Calcium 9.4 8.4 - 10.5 mg/dL   GFR 51.09 (L) >60.00  mL/min    Assessment/Plan: 1. Acute bacterial bronchitis Rx Azithromycin and Tessalon. Plain Mucinex recommended. Supportive measures reviewed.  - azithromycin (ZITHROMAX) 250 MG tablet; Take 2 tablets on Day 1. Then take 1 tablet daily.  Dispense: 6 tablet; Refill: 0 - benzonatate (TESSALON) 100 MG capsule; Take 1 capsule (100 mg total) by mouth 3 (three) times daily as needed for cough.  Dispense: 30 capsule; Refill: 0   [name], PA-C

## 2017-06-16 NOTE — Patient Instructions (Signed)
Take antibiotic (Azithromycin) as directed.  Increase fluids.  Get plenty of rest. Use Plain Mucinex for congestion. Tessalon as directed for cough. Take a daily probiotic (I recommend Align or Culturelle, but even Activia Yogurt may be beneficial).  A humidifier placed in the bedroom may offer some relief for a dry, scratchy throat of nasal irritation.  Read information below on acute bronchitis. Please call or return to clinic if symptoms are not improving.  Acute Bronchitis Bronchitis is when the airways that extend from the windpipe into the lungs get red, puffy, and painful (inflamed). Bronchitis often causes thick spit (mucus) to develop. This leads to a cough. A cough is the most common symptom of bronchitis. In acute bronchitis, the condition usually begins suddenly and goes away over time (usually in 2 weeks). Smoking, allergies, and asthma can make bronchitis worse. Repeated episodes of bronchitis may cause more lung problems.  HOME CARE  Rest.  Drink enough fluids to keep your pee (urine) clear or pale yellow (unless you need to limit fluids as told by your doctor).  Only take over-the-counter or prescription medicines as told by your doctor.  Avoid smoking and secondhand smoke. These can make bronchitis worse. If you are a smoker, think about using nicotine gum or skin patches. Quitting smoking will help your lungs heal faster.  Reduce the chance of getting bronchitis again by:  Washing your hands often.  Avoiding people with cold symptoms.  Trying not to touch your hands to your mouth, nose, or eyes.  Follow up with your doctor as told.  GET HELP IF: Your symptoms do not improve after 1 week of treatment. Symptoms include:  Cough.  Fever.  Coughing up thick spit.  Body aches.  Chest congestion.  Chills.  Shortness of breath.  Sore throat.  GET HELP RIGHT AWAY IF:   You have an increased fever.  You have chills.  You have severe shortness of  breath.  You have bloody thick spit (sputum).  You throw up (vomit) often.  You lose too much body fluid (dehydration).  You have a severe headache.  You faint.  MAKE SURE YOU:   Understand these instructions.  Will watch your condition.  Will get help right away if you are not doing well or get worse. Document Released: 10/15/2007 Document Revised: 12/29/2012 Document Reviewed: 10/19/2012 [facility] Patient Information 2015 [place], [place]. This information is not intended to replace advice given to you by your health care provider. Make sure you discuss any questions you have with your health care provider.

## 2017-06-17 ENCOUNTER — Ambulatory Visit: Payer: PPO | Admitting: Endocrinology

## 2017-06-17 DIAGNOSIS — R531 Weakness: Secondary | ICD-10-CM | POA: Diagnosis not present

## 2017-06-17 DIAGNOSIS — L301 Dyshidrosis [pompholyx]: Secondary | ICD-10-CM | POA: Diagnosis not present

## 2017-06-17 DIAGNOSIS — B372 Candidiasis of skin and nail: Secondary | ICD-10-CM | POA: Diagnosis not present

## 2017-06-18 DIAGNOSIS — E113412 Type 2 diabetes mellitus with severe nonproliferative diabetic retinopathy with macular edema, left eye: Secondary | ICD-10-CM | POA: Diagnosis not present

## 2017-06-19 DIAGNOSIS — F3189 Other bipolar disorder: Secondary | ICD-10-CM | POA: Diagnosis not present

## 2017-06-23 DIAGNOSIS — Z89432 Acquired absence of left foot: Secondary | ICD-10-CM | POA: Diagnosis not present

## 2017-06-23 DIAGNOSIS — Z89511 Acquired absence of right leg below knee: Secondary | ICD-10-CM | POA: Diagnosis not present

## 2017-06-23 DIAGNOSIS — E1142 Type 2 diabetes mellitus with diabetic polyneuropathy: Secondary | ICD-10-CM | POA: Diagnosis not present

## 2017-06-23 DIAGNOSIS — Z872 Personal history of diseases of the skin and subcutaneous tissue: Secondary | ICD-10-CM | POA: Diagnosis not present

## 2017-06-23 DIAGNOSIS — Z09 Encounter for follow-up examination after completed treatment for conditions other than malignant neoplasm: Secondary | ICD-10-CM | POA: Diagnosis not present

## 2017-06-24 ENCOUNTER — Other Ambulatory Visit: Payer: Self-pay

## 2017-06-24 ENCOUNTER — Ambulatory Visit (HOSPITAL_BASED_OUTPATIENT_CLINIC_OR_DEPARTMENT_OTHER)
Admission: RE | Admit: 2017-06-24 | Discharge: 2017-06-24 | Disposition: A | Discharge status: Home / Self Care | Payer: PPO | Source: Ambulatory Visit | Attending: Physician Assistant | Admitting: Physician Assistant

## 2017-06-24 ENCOUNTER — Other Ambulatory Visit: Payer: Self-pay | Admitting: Physician Assistant

## 2017-06-24 ENCOUNTER — Encounter: Payer: Self-pay | Admitting: Physician Assistant

## 2017-06-24 ENCOUNTER — Ambulatory Visit (INDEPENDENT_AMBULATORY_CARE_PROVIDER_SITE_OTHER): Payer: PPO | Admitting: Physician Assistant

## 2017-06-24 VITALS — BP 120/72 | HR 82 | Temp 97.9°F | Resp 16 | Ht 60.0 in | Wt 193.0 lb

## 2017-06-24 DIAGNOSIS — J189 Pneumonia, unspecified organism: Secondary | ICD-10-CM

## 2017-06-24 DIAGNOSIS — J181 Lobar pneumonia, unspecified organism: Secondary | ICD-10-CM | POA: Diagnosis not present

## 2017-06-24 DIAGNOSIS — E113411 Type 2 diabetes mellitus with severe nonproliferative diabetic retinopathy with macular edema, right eye: Secondary | ICD-10-CM | POA: Diagnosis not present

## 2017-06-24 DIAGNOSIS — J209 Acute bronchitis, unspecified: Secondary | ICD-10-CM

## 2017-06-24 DIAGNOSIS — E875 Hyperkalemia: Secondary | ICD-10-CM

## 2017-06-24 DIAGNOSIS — I700 Atherosclerosis of aorta: Secondary | ICD-10-CM | POA: Insufficient documentation

## 2017-06-24 MED ORDER — IPRATROPIUM-ALBUTEROL 0.5-2.5 (3) MG/3ML IN SOLN
3.0000 mL | Freq: Once | RESPIRATORY_TRACT | Status: AC
  Administered 2017-06-24: 3 mL via RESPIRATORY_TRACT

## 2017-06-24 MED ORDER — LEVOFLOXACIN 500 MG PO TABS
500.0000 mg | ORAL_TABLET | Freq: Every day | ORAL | 0 refills | Status: AC
Start: 2017-06-24 — End: 2017-07-01

## 2017-06-24 MED ORDER — FLUTICASONE PROPIONATE HFA 110 MCG/ACT IN AERO: 1.0000 | INHALATION_SPRAY | Freq: Two times a day (BID) | RESPIRATORY_TRACT | 0 refills | Status: DC

## 2017-06-24 NOTE — Progress Notes (Signed)
 Patient presents to clinic today c/o continued chest congestion and cough s/p treatment for acute bacterial bronchitis with Azithromycin. Denies any fevers or chills. Energy levels are improved. Is coughing up thick, green sputum. Tessalon is helping cough. Denies recent travel.  Past Medical History:  Diagnosis Date  . Bipolar 1 disorder ([facility])   . Cellulitis and abscess of foot 03/08/2015  . Diabetes mellitus    INSULIN DEPENDENT  . Diabetic neuropathy ([facility])   . Diabetic neuropathy ([facility])   . 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 H/O hiatal hernia   . Hyperlipidemia   . Hypertension    past hx of  . Iron malabsorption 09/26/2015  . Numbness and tingling    Hx; of in B/LLE and B/LUE  . Other iron deficiency anemias 09/20/2015    Current Outpatient Medications on File Prior to Visit  Medication Sig Dispense Refill  . aspirin 325 MG EC tablet Take 325 mg by mouth daily.    . atorvastatin (LIPITOR) 20 MG tablet Take 1 tablet (20 mg total) by mouth at bedtime. 90 tablet 1  . benzonatate (TESSALON) 100 MG capsule Take 1 capsule (100 mg total) by mouth 3 (three) times daily as needed for cough. 30 capsule 0  . Blood Glucose Monitoring Suppl (FREESTYLE LITE) DEVI Use to check blood sugar 3 times a day dx code E11.39 1 each 0  . Continuous Blood Gluc Receiver (FREESTYLE LIBRE READER) DEVI 1 Device daily by Does not apply route. Dx Code E11.39 1 Device 1  . Continuous Blood Gluc Sensor (FREESTYLE LIBRE SENSOR SYSTEM) MISC Use on sensor for 10 days to check blood sugar levels. Dx Code E11.39 3 each 2  . diclofenac (VOLTAREN) 75 MG EC tablet Take 75 mg by mouth. Takes 1 tablet on Mon and Thurs    . escitalopram (LEXAPRO) 5 MG tablet Take 5 mg by mouth 3 (three) times daily.     . fluticasone (FLONASE) 50 MCG/ACT nasal spray USE 2 SPRAY(S) IN EACH NOSTRIL ONCE DAILY 16 g 2  . FREESTYLE LITE test strip USE AS DIRECTED TO CHECK BLOOD SUGAR THREE TIMES DAILY 100 each 3  . gabapentin  (NEURONTIN) 600 MG tablet TAKE 2 TABLETS BY MOUTH THREE TIMES DAILY (Patient taking differently: TAKE 1200 mg  BY MOUTH THREE TIMES DAILY) 540 tablet 1  . hydrochlorothiazide (MICROZIDE) 12.5 MG capsule Take 1 capsule (12.5 mg total) by mouth daily. 30 capsule 0  . Insulin Disposable Pump (V-GO 30) KIT 1 Pump by Does not apply route daily. 1 kit 2  . insulin regular (NOVOLIN R RELION) 100 units/mL injection Take 6 units with breakfast, 8 units with lunch and 8 units with dinner (Patient taking differently: Inject 4-10 Units into the skin 3 (three) times daily before meals. Per meal size, small meals = less pumps to Vgo and large meal = more pumps to Vgo. 1 pump = 2 units.) 10 mL 3  . INSULIN SYRINGE 1CC/29G 29G X 1/2" 1 ML MISC Use 1 per day. 100 each 2  . Lancets (FREESTYLE) lancets Use as instructed to check blood sugar 3 times per day dx code E11.39 100 each 3  . metFORMIN (GLUCOPHAGE) 500 MG tablet TAKE 2 TABLETS BY MOUTH IN THE MORNING AND 1 IN THE EVENING 270 tablet 1  . OVER THE COUNTER MEDICATION Take 2 each by mouth daily. Centrum-Muli. Vitamin Gummy    . pantoprazole (PROTONIX) 40 MG tablet TAKE 1 TABLET BY MOUTH ONCE DAILY 90 tablet 1  .   budesonide-formoterol (SYMBICORT) 80-4.5 MCG/ACT inhaler Inhale 2 puffs into the lungs 2 (two) times daily. (Patient not taking: Reported on 06/24/2017) 1 Inhaler 3   No current facility-administered medications on file prior to visit.     Allergies  Allergen Reactions  . Keflex [Cephalexin]     Tremors, rash, hard to breathe  . Lithium Nausea And Vomiting and Other (See Comments)    Other reaction(s): Other (See Comments) Can not keep this medication down. It makes her terribly ill. Can not keep this medication down. It makes her terribly ill.  . Ativan [Lorazepam] Other (See Comments)    Other reaction(s): ANAPHYLAXIS Other reaction(s): Other (See Comments) Abnormal behavior Abnormal behavior  . Demerol [Meperidine] Other (See Comments)     Abnormal behavior (sees things) Other reaction(s): ANAPHYLAXIS Other reaction(s): Other (See Comments) Abnormal behavior Abnormal behavior  . Oxycodone Other (See Comments)    Other reaction(s): OTHER Abnormal behavior  . Codeine Itching and Other (See Comments)    hallucinations  . Doxycycline Other (See Comments)    Severe muscle tremor  . Darvocet [Propoxyphene N-Acetaminophen] Itching  . Latex Itching    Other reaction(s): OTHER  . Propoxyphene Itching    Family History  Problem Relation Age of Onset  . Diabetes Mother   . Mental illness Mother   . Bipolar disorder Mother   . Hypertension Father   . Diabetes Maternal Grandmother   . Heart disease Neg Hx     Social History   Socioeconomic History  . Marital status: Married    Spouse name: None  . Number of children: 2  . Years of education: 50  . Highest education level: None  Social Needs  . Financial resource strain: None  . Food insecurity - worry: None  . Food insecurity - inability: None  . Transportation needs - medical: None  . Transportation needs - non-medical: None  Occupational History    Comment: disabled  Tobacco Use  . Smoking status: Never Smoker  . Smokeless tobacco: Never Used  Substance and Sexual Activity  . Alcohol use: No    Alcohol/week: 0.0 oz  . Drug use: No  . Sexual activity: No    Partners: Male  Other Topics Concern  . None  Social History Narrative   Lives with husband in home   Caffeine use - Coke 2 or 3 16 oz daily   Review of Systems - See HPI.  All other ROS are negative.  BP 120/72   Pulse 82   Temp 97.9 F (36.6 C) (Oral)   Resp 16   Ht 5' (1.524 m)   Wt 193 lb (87.5 kg)   SpO2 97%   BMI 37.69 kg/m   Physical Exam  Constitutional: She is oriented to person, place, and time and well-developed, well-nourished, and in no distress.  HENT:  Head: Normocephalic and atraumatic.  Right Ear: External ear normal.  Left Ear: External ear normal.  Nose: Nose  normal.  Mouth/Throat: Oropharynx is clear and moist. No oropharyngeal exudate.  TM within normal limits bilaterally.  Eyes: Conjunctivae are normal.  Neck: Neck supple.  Cardiovascular: Normal rate, regular rhythm, normal heart sounds and intact distal pulses.  Pulmonary/Chest: Effort normal. No respiratory distress. She has decreased breath sounds in the left lower field. She has wheezes in the right lower field and the left lower field. She has rales in the left lower field. She exhibits no tenderness.  Lymphadenopathy:    She has no cervical adenopathy.  Neurological:  She is alert and oriented to person, place, and time.  Skin: Skin is warm and dry. No rash noted.  Psychiatric: Affect normal.  Vitals reviewed.  Recent Results (from the past 2160 hour(s))  Fructosamine     Status: None   Collection Time: 04/15/17  4:19 PM  Result Value Ref Range   Fructosamine 275 0 - 285 umol/L    Comment: Published reference interval for apparently healthy subjects between age 62 and 41 is 41 - 285 umol/L and in a poorly controlled diabetic population is 228 - 563 umol/L with a mean of 396 umol/L.   Basic metabolic panel     Status: Abnormal   Collection Time: 04/15/17  4:19 PM  Result Value Ref Range   Sodium 137 135 - 145 mEq/L   Potassium 5.3 (H) 3.5 - 5.1 mEq/L   Chloride 102 96 - 112 mEq/L   CO2 28 19 - 32 mEq/L   Glucose, Bld 149 (H) 70 - 99 mg/dL   BUN 25 (H) 6 - 23 mg/dL   Creatinine, Ser 0.97 0.40 - 1.20 mg/dL   Calcium 9.0 8.4 - 10.5 mg/dL   GFR 62.21 >60.00 mL/min  Basic metabolic panel     Status: Abnormal   Collection Time: 05/20/17 11:52 AM  Result Value Ref Range   Sodium 138 135 - 145 mEq/L   Potassium 5.7 (H) 3.5 - 5.1 mEq/L   Chloride 103 96 - 112 mEq/L   CO2 29 19 - 32 mEq/L   Glucose, Bld 267 (H) 70 - 99 mg/dL   BUN 22 6 - 23 mg/dL   Creatinine, Ser 0.91 0.40 - 1.20 mg/dL   Calcium 9.2 8.4 - 10.5 mg/dL   GFR 66.95 >60.00 mL/min  Basic metabolic panel      Status: Abnormal   Collection Time: 06/09/17  3:03 PM  Result Value Ref Range   Sodium 139 135 - 145 mEq/L   Potassium 5.4 (H) 3.5 - 5.1 mEq/L   Chloride 102 96 - 112 mEq/L   CO2 31 19 - 32 mEq/L   Glucose, Bld 146 (H) 70 - 99 mg/dL   BUN 29 (H) 6 - 23 mg/dL   Creatinine, Ser 1.15 0.40 - 1.20 mg/dL   Calcium 9.4 8.4 - 10.5 mg/dL   GFR 51.09 (L) >60.00 mL/min    Assessment/Plan: 1. Community acquired pneumonia of left lower lobe of lung ([place]) Afebrile. Vitals stable. Wheeze improved with Duoneb. CXR reveals LLL pneumonia. Start Levaquin 500 mg x 7 days. Continue Tessalon. Supportive measures and OTC medications reviewed. - ipratropium-albuterol (DUONEB) 0.5-2.5 (3) MG/3ML nebulizer solution 3 mL - DG Chest 2 View; Future   [name], PA-C

## 2017-06-24 NOTE — Patient Instructions (Addendum)
Please go to the [facility] office for x-ray. I will call you in just a bit with STAT results and we will alter regimen accordingly.  Increase fluids. Continue Tessalon ( I have sent in a refill). I am sending in a script for Flovent to help with wheezing.   Again we will alter treatment regimen according to results.

## 2017-06-29 ENCOUNTER — Other Ambulatory Visit: Payer: Self-pay | Admitting: Physician Assistant

## 2017-06-29 ENCOUNTER — Telehealth: Payer: Self-pay | Admitting: Physician Assistant

## 2017-06-29 ENCOUNTER — Telehealth: Payer: Self-pay | Admitting: *Deleted

## 2017-06-29 DIAGNOSIS — J208 Acute bronchitis due to other specified organisms: Principal | ICD-10-CM

## 2017-06-29 DIAGNOSIS — B9689 Other specified bacterial agents as the cause of diseases classified elsewhere: Secondary | ICD-10-CM

## 2017-06-29 MED ORDER — BENZONATATE 100 MG PO CAPS: 100.0000 mg | ORAL_CAPSULE | Freq: Three times a day (TID) | ORAL | 0 refills | Status: DC | PRN

## 2017-06-29 NOTE — Telephone Encounter (Signed)
Copied from [place]. Topic: Inquiry >> Jun 29, 2017 12:20 PM Pricilla Handler wrote: Reason for CRM: Patient is requesting a refill of Benzonatate (TESSALON) 100 MG capsule. Patient states that she is still coughing. Someone from office please call patient today regarding refill.       Thank You!!!

## 2017-06-29 NOTE — Telephone Encounter (Signed)
Patient aware medication was refilled. She is also aware to keep appointment for Wednesday.

## 2017-06-29 NOTE — Telephone Encounter (Signed)
Routing to PCP to advise.  Copied from [place]. Topic: Inquiry >> Jun 29, 2017 12:20 PM Pricilla Handler wrote: Reason for CRM: Patient is requesting a refill of Benzonatate (TESSALON) 100 MG capsule. Patient states that she is still coughing. Someone from office please call patient today regarding refill.       Thank You!!!

## 2017-06-29 NOTE — Telephone Encounter (Signed)
Refill has been sent. Have her follow-up as scheduled this week.

## 2017-06-29 NOTE — Addendum Note (Signed)
Addended by: Brunetta Jeans on: 06/29/2017 01:10 PM   Modules accepted: Orders

## 2017-07-01 ENCOUNTER — Ambulatory Visit: Payer: PPO | Admitting: Physician Assistant

## 2017-07-02 NOTE — Progress Notes (Signed)
Patient ID: Dana [name], female   DOB: [birth year], 62 y.o.   MRN: [id number]           Reason for Appointment: Follow-up for Type 2 Diabetes  Referring physician: Elyn Aquas  History of Present Illness:          Date of diagnosis of type 2 diabetes mellitus: 2006       Background history:   She thinks her blood sugar was 300-400 at the time of diagnosis and she was started on insulin soon after this. She thinks she has been on metformin only for the last 5 years Her A1c history is available since only about 2013 and this had been consistently over 10% She  had  poor control of her diabetes with pre-consultation A1c of 13%, was on Levemir only  Although she did better with the V-go pump 30 in 2016 she stopped this because of increased cost after a short time  Recent history:    Her A1c is 7.2, has been as low as 6.7, previously up to 7.8  INSULIN regimen is: V-go pump, 30 unit basal, boluses 6 units at breakfast, 6-8 at lunch and 8-10 at dinner  Current blood sugar patterns, daily management and problems identified:  She has been using the freestyle Libre sensor  Although she has tried it for the last 3 or 4 weeks she has difficulty using it consistently and-technical problems with sometimes not working or falling often she is finally able to start using it more regularly  Her blood sugar data is however quite intermittent  She also has not compared this to her fingersticks  She does have significant VARIABILITY with standard deviation 56 especially after supper where blood sugars can be up to 300 but occasionally only 125 between 7-10 PM  She also said that she will occasionally have hypoglycemia when she has diarrhea including sometimes at night; this has occurred only once 2 nights ago  She is adjusting her mealtime doses based on what she is eating but postprandial readings are not consistent after evening meal and she does not have enough data AFTER breakfast  Not  consistently high after lunch  OVER the last 10 fasting blood sugars are reasonably good with some variability  Also at times may have nausea  She is continuing to take metformin, currently taking regular metformin twice a day                         Non-insulin hypoglycemic drugs the patient is taking are: Metformin 1g bid  Side effects from medications have been: ? diarrhea from metformin   Compliance with the medical regimen: Good Hypoglycemia: As above    Glucose monitoring:  done 2-3 times a day         Glucometer:  FreeStyle libre .      Blood Glucose readings by time of day  Mean values apply above for all meters except median for One Touch  PRE-MEAL Fasting Lunch Dinner Bedtime Overall  Glucose range:       Mean/median: 135  159  174  216  161   POST-MEAL PC Breakfast PC Lunch PC Dinner  Glucose range:     Mean/median: 153  170  212    CGM was use 49% of the time TIME IN RANGE 62% and 35% readings high along with 3% low  Self-care:  Typical meal intake: Breakfast is irregular otherwise may have toast and an egg or  sausage.  Lunch is a sandwich or cheeseburger, evening meal is meat and 2 vegetables.  For snacks  will have peanut butter crackers in pms Dinner 5-7 pm     She has regular drinks almost every day          Dietician visit, most recent: Never               Exercise: none  Weight history: Stable over the last 3-4 years, was 400 pounds about 5 years ago  Wt Readings from Last 3 Encounters:  07/03/17 188 lb 6.4 oz (85.5 kg)  06/24/17 193 lb (87.5 kg)  06/16/17 193 lb (87.5 kg)    Glycemic control:   Lab Results  Component Value Date   HGBA1C 7.2 07/03/2017   HGBA1C 7.4 03/04/2017   HGBA1C 7.1 (H) 12/02/2016   Lab Results  Component Value Date   MICROALBUR 0.8 10/01/2016   LDLCALC 77 10/01/2016   CREATININE 1.15 06/09/2017       Allergies as of 07/03/2017      Reactions   Keflex [cephalexin]    Tremors, rash, hard to breathe   Lithium  Nausea And Vomiting, Other (See Comments)   Other reaction(s): Other (See Comments) Can not keep this medication down. It makes her terribly ill. Can not keep this medication down. It makes her terribly ill.   Ativan [lorazepam] Other (See Comments)   Other reaction(s): ANAPHYLAXIS Other reaction(s): Other (See Comments) Abnormal behavior Abnormal behavior   Demerol [meperidine] Other (See Comments)   Abnormal behavior (sees things) Other reaction(s): ANAPHYLAXIS Other reaction(s): Other (See Comments) Abnormal behavior Abnormal behavior   Oxycodone Other (See Comments)   Other reaction(s): OTHER Abnormal behavior   Codeine Itching, Other (See Comments)   hallucinations   Doxycycline Other (See Comments)   Severe muscle tremor   Darvocet [propoxyphene N-acetaminophen] Itching   Latex Itching   Other reaction(s): OTHER   Propoxyphene Itching      Medication List        Accurate as of 07/03/17  8:06 PM. Always use your most recent med list.          aspirin 325 MG EC tablet Take 325 mg by mouth daily.   atorvastatin 20 MG tablet Commonly known as:  LIPITOR Take 1 tablet (20 mg total) by mouth at bedtime.   diclofenac 75 MG EC tablet Commonly known as:  VOLTAREN Take 75 mg by mouth. Takes 1 tablet on Mon and Thurs   escitalopram 5 MG tablet Commonly known as:  LEXAPRO Take 5 mg by mouth 3 (three) times daily.   fluticasone 110 MCG/ACT inhaler Commonly known as:  FLOVENT HFA Inhale 1 puff into the lungs 2 (two) times daily.   fluticasone 50 MCG/ACT nasal spray Commonly known as:  FLONASE USE 2 SPRAY(S) IN EACH NOSTRIL ONCE DAILY   freestyle lancets Use as instructed to check blood sugar 3 times per day dx code E11.39   FREESTYLE LIBRE 14 DAY READER Devi 1 Device by Does not apply route once for 1 dose.   FREESTYLE LIBRE 14 DAY SENSOR Misc 1 Units by Does not apply route every 14 (fourteen) days.   FREESTYLE LITE Devi Use to check blood sugar 3 times a  day dx code E11.39   FREESTYLE LITE test strip Generic drug:  glucose blood USE AS DIRECTED TO CHECK BLOOD SUGAR THREE TIMES DAILY   gabapentin 600 MG tablet Commonly known as:  NEURONTIN TAKE 2 TABLETS BY MOUTH THREE TIMES DAILY  hydrochlorothiazide 12.5 MG capsule Commonly known as:  MICROZIDE TAKE 1 CAPSULE BY MOUTH ONCE DAILY   insulin regular 100 units/mL injection Commonly known as:  NOVOLIN R RELION Take 6 units with breakfast, 8 units with lunch and 8 units with dinner   INSULIN SYRINGE 1CC/29G 29G X 1/2" 1 ML Misc Use 1 per day.   metFORMIN 500 MG 24 hr tablet Commonly known as:  GLUCOPHAGE-XR Take 4 tablets (2,000 mg total) by mouth daily with supper.   OVER THE COUNTER MEDICATION Take 2 each by mouth daily. Centrum-Muli. Vitamin Gummy   pantoprazole 40 MG tablet Commonly known as:  PROTONIX TAKE 1 TABLET BY MOUTH ONCE DAILY   V-GO 30 Kit 1 Pump by Does not apply route daily.       Allergies:  Allergies  Allergen Reactions  . Keflex [Cephalexin]     Tremors, rash, hard to breathe  . Lithium Nausea And Vomiting and Other (See Comments)    Other reaction(s): Other (See Comments) Can not keep this medication down. It makes her terribly ill. Can not keep this medication down. It makes her terribly ill.  . Ativan [Lorazepam] Other (See Comments)    Other reaction(s): ANAPHYLAXIS Other reaction(s): Other (See Comments) Abnormal behavior Abnormal behavior  . Demerol [Meperidine] Other (See Comments)    Abnormal behavior (sees things) Other reaction(s): ANAPHYLAXIS Other reaction(s): Other (See Comments) Abnormal behavior Abnormal behavior  . Oxycodone Other (See Comments)    Other reaction(s): OTHER Abnormal behavior  . Codeine Itching and Other (See Comments)    hallucinations  . Doxycycline Other (See Comments)    Severe muscle tremor  . Darvocet [Propoxyphene N-Acetaminophen] Itching  . Latex Itching    Other reaction(s): OTHER  .  Propoxyphene Itching    Past Medical History:  Diagnosis Date  . Bipolar 1 disorder ([place])   . Cellulitis and abscess of foot 03/08/2015  . Diabetes mellitus    INSULIN DEPENDENT  . Diabetic neuropathy ([place])   . Diabetic neuropathy ([place])   . 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 H/O hiatal hernia   . Hyperlipidemia   . Hypertension    past hx of  . Iron malabsorption 09/26/2015  . Numbness and tingling    Hx; of in B/LLE and B/LUE  . Other iron deficiency anemias 09/20/2015    Past Surgical History:  Procedure Laterality Date  . ABDOMINAL HYSTERECTOMY    . ADENOIDECTOMY     Hx: of  . AMPUTATION  10/24/2011   Procedure: AMPUTATION RAY;  Surgeon: Newt Minion, MD;  Location: [facility];  Service: Orthopedics;  Laterality: Right;  Right Foot 3rd Ray Amputation  . AMPUTATION Right 12/16/2012   Procedure: Right Foot Transmetatarsal Amputation;  Surgeon: Newt Minion, MD;  Location: [facility];  Service: Orthopedics;  Laterality: Right;  . AMPUTATION Left 03/09/2015   Procedure: LEFT FOOT 1ST RAY AMPUTATION;  Surgeon: Newt Minion, MD;  Location: [facility];  Service: Orthopedics;  Laterality: Left;  . BLADDER SURGERY     x 2, tacked 1st time; mesh "eroded", had to be removed  . Bladder Tact   2002  . BREAST SURGERY Left    "knot removed"  . CARPAL TUNNEL RELEASE Left   . COLON SURGERY    . NASAL SEPTUM SURGERY  1976  . TONSILLECTOMY     age 47's    Family History  Problem Relation Age of Onset  . Diabetes Mother   . Mental illness Mother   .  Bipolar disorder Mother   . Hypertension Father   . Diabetes Maternal Grandmother   . Heart disease Neg Hx     Social History:  reports that  has never smoked. she has never used smokeless tobacco. She reports that she does not drink alcohol or use drugs.    Review of Systems   Has diabetic neuropathy with sensory loss Taking  659m gabapentin and analgesics from PCP  DIABETIC foot ulcers with history of amputations  On the right  leg she has a prosthesis after her BKA, able to walk   Lipid history: On Lipitor and followed by PCP    Lab Results  Component Value Date   CHOL 128 01/20/2017   HDL 40.10 10/01/2016   LDLCALC 77 10/01/2016   TRIG 175.0 (H) 10/01/2016   CHOLHDL 4 10/01/2016             Physical Examination:  BP (!) 144/78 (BP Location: Left Arm, Patient Position: Sitting, Cuff Size: Normal)   Pulse 77   Ht 5' (1.524 m)   Wt 188 lb 6.4 oz (85.5 kg)   BMI 36.79 kg/m        ASSESSMENT:  Diabetes type 2, uncontrolled     See history of present illness for detailed discussion of  current management, blood sugar patterns and problems identified  [place] has only about 50% of her last 2 weeks covered by the sensor  She is now finally getting less technical problems using the sensor but may not be checking her sugars enough to get a full pattern  Her blood sugars are averaging 160 and this is fairly adequate for her although most likely she can do better with postprandial control after supper Overnight blood sugars are good with her current basal Also as above she may will be having GI side effects from using regular metformin and she was not aware of this  HYPERTENSION: This is mild and well controlled, she may be anxious today  Recommendations:  No change in the basal of 30 units  She was changed to extended release metformin to reduce tendency to GI side effects, likely to be covering from metformin as these are intermittent and not new  She needs to check her blood sugars consistently 2-4 times a day with the sensor  Discussed options of helping the sensor to stick if she is having difficulty with this not staying on for 14 days, also new prescription sent for the 14 day sensor and reader to replace her 10 day device  She will try to further evaluate how her blood sugars are doing after various foods in the evening to help her  adjust doses before eating  She does need to take as much as 6 clicks at suppertime if she is eating a larger meal or more carbohydrate  Walk regularly for exercise  Since blood sugars after breakfast and report available she needs to check more readings with morning and lunchtime  No change in boluses at breakfast and lunch for now  Bolus 30 minutes before eating  Follow-up with PCP if continuing to have nausea or diarrhea  Counseling time on subjects discussed in assessment and plan sections is over 50% of today's 25 minute visit  Patient Instructions  Click 30 min before  May use Skin Tac if not sticking      AElayne Snare2/22/2019, 8:06 PM   Note: This office note was prepared with [employer]ivian[employer] Spare  voice recognition system technology. Any transcriptional errors that result from this process are unintentional.

## 2017-07-03 ENCOUNTER — Ambulatory Visit (INDEPENDENT_AMBULATORY_CARE_PROVIDER_SITE_OTHER): Payer: PPO | Admitting: Endocrinology

## 2017-07-03 ENCOUNTER — Encounter: Payer: Self-pay | Admitting: Endocrinology

## 2017-07-03 VITALS — BP 144/78 | HR 77 | Ht 60.0 in | Wt 188.4 lb

## 2017-07-03 DIAGNOSIS — E1142 Type 2 diabetes mellitus with diabetic polyneuropathy: Secondary | ICD-10-CM

## 2017-07-03 DIAGNOSIS — Z794 Long term (current) use of insulin: Secondary | ICD-10-CM | POA: Diagnosis not present

## 2017-07-03 DIAGNOSIS — E1165 Type 2 diabetes mellitus with hyperglycemia: Secondary | ICD-10-CM | POA: Diagnosis not present

## 2017-07-03 DIAGNOSIS — E118 Type 2 diabetes mellitus with unspecified complications: Secondary | ICD-10-CM

## 2017-07-03 LAB — POCT GLYCOSYLATED HEMOGLOBIN (HGB A1C): Hemoglobin A1C: 7.2

## 2017-07-03 MED ORDER — FREESTYLE LIBRE 14 DAY READER DEVI: 1.0000 | Freq: Once | 0 refills | Status: AC

## 2017-07-03 MED ORDER — FREESTYLE LIBRE 14 DAY SENSOR MISC: 1.0000 [IU] | 4 refills | Status: DC

## 2017-07-03 MED ORDER — METFORMIN HCL ER 500 MG PO TB24: 2000.0000 mg | ORAL_TABLET | Freq: Every day | ORAL | 3 refills | Status: DC

## 2017-07-03 NOTE — Patient Instructions (Addendum)
Click 30 min before  May use Skin Tac if not sticking

## 2017-07-06 ENCOUNTER — Telehealth: Payer: Self-pay | Admitting: Endocrinology

## 2017-07-06 NOTE — Telephone Encounter (Signed)
Pt needs to know if she still needs to take the Metformin 1000 mg in the morning-Dr added 4 Metformin at dinner. Please advise patient ph# [phone]

## 2017-07-06 NOTE — Telephone Encounter (Signed)
See 07/03/17 OV note with updated meds. Left detailed mess informing her of Metformin change based on MD's note(see below) and to call me back if she has further questions.   metFORMIN 500 MG 24 hr tablet Commonly known as:  GLUCOPHAGE-XR Take 4 tablets (2,000 mg total) by mouth daily with supper.

## 2017-07-06 NOTE — Telephone Encounter (Signed)
Patient is returning your call.about her medication. Please advise

## 2017-07-07 NOTE — Telephone Encounter (Signed)
Left detailed mess informing pt of MD's instructions for Metformin. See 07/06/17 telephone encounter AND 07/03/17 OV note.

## 2017-07-08 ENCOUNTER — Ambulatory Visit: Payer: PPO | Admitting: Physician Assistant

## 2017-07-09 DIAGNOSIS — Z961 Presence of intraocular lens: Secondary | ICD-10-CM | POA: Diagnosis not present

## 2017-07-09 DIAGNOSIS — H3563 Retinal hemorrhage, bilateral: Secondary | ICD-10-CM | POA: Diagnosis not present

## 2017-07-09 DIAGNOSIS — H35373 Puckering of macula, bilateral: Secondary | ICD-10-CM | POA: Diagnosis not present

## 2017-07-09 DIAGNOSIS — H3582 Retinal ischemia: Secondary | ICD-10-CM | POA: Diagnosis not present

## 2017-07-09 DIAGNOSIS — E113512 Type 2 diabetes mellitus with proliferative diabetic retinopathy with macular edema, left eye: Secondary | ICD-10-CM | POA: Diagnosis not present

## 2017-07-09 DIAGNOSIS — H35033 Hypertensive retinopathy, bilateral: Secondary | ICD-10-CM | POA: Diagnosis not present

## 2017-07-09 DIAGNOSIS — E113511 Type 2 diabetes mellitus with proliferative diabetic retinopathy with macular edema, right eye: Secondary | ICD-10-CM | POA: Diagnosis not present

## 2017-07-14 ENCOUNTER — Encounter: Payer: Self-pay | Admitting: Physician Assistant

## 2017-07-14 ENCOUNTER — Other Ambulatory Visit: Payer: Self-pay

## 2017-07-14 ENCOUNTER — Ambulatory Visit (INDEPENDENT_AMBULATORY_CARE_PROVIDER_SITE_OTHER): Payer: PPO | Admitting: Physician Assistant

## 2017-07-14 ENCOUNTER — Ambulatory Visit (HOSPITAL_BASED_OUTPATIENT_CLINIC_OR_DEPARTMENT_OTHER)
Admission: RE | Admit: 2017-07-14 | Discharge: 2017-07-14 | Disposition: A | Discharge status: Home / Self Care | Payer: PPO | Source: Ambulatory Visit | Attending: Physician Assistant | Admitting: Physician Assistant

## 2017-07-14 VITALS — BP 128/82 | HR 69 | Temp 97.7°F | Resp 16 | Ht 60.0 in | Wt 188.0 lb

## 2017-07-14 DIAGNOSIS — R0989 Other specified symptoms and signs involving the circulatory and respiratory systems: Secondary | ICD-10-CM | POA: Diagnosis not present

## 2017-07-14 DIAGNOSIS — E875 Hyperkalemia: Secondary | ICD-10-CM

## 2017-07-14 DIAGNOSIS — J189 Pneumonia, unspecified organism: Secondary | ICD-10-CM

## 2017-07-14 LAB — BASIC METABOLIC PANEL
BUN: 26 mg/dL — ABNORMAL HIGH (ref 6–23)
CALCIUM: 9.5 mg/dL (ref 8.4–10.5)
CO2: 32 mEq/L (ref 19–32)
Chloride: 101 mEq/L (ref 96–112)
Creatinine, Ser: 0.96 mg/dL (ref 0.40–1.20)
GFR: 62.91 mL/min (ref >60.00)
GLUCOSE: 229 mg/dL — AB (ref 70–99)
Potassium: 5.6 mEq/L — ABNORMAL HIGH (ref 3.5–5.1)
SODIUM: 137 meq/L (ref 135–145)

## 2017-07-14 NOTE — Progress Notes (Signed)
Patient presents to clinic today for follow-up of CAP. Patient was seen on 06/24/2017 at which time CXR obtained revealing Left lower lobe pneumonia. Patient started on Levaquin. Endorses taking as directed. Denies any recurrence of fever. Is breathing well. Cough is much improved but still noting cough that is worse in the morning and late at night. Is sometimes productive of sputum. Denies any new or worsening symptoms.  Past Medical History:  Diagnosis Date  . Bipolar 1 disorder ([place])   . Cellulitis and abscess of foot 03/08/2015  . Diabetes mellitus    INSULIN DEPENDENT  . Diabetic neuropathy ([place])   . Diabetic neuropathy ([place])   . 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 H/O hiatal hernia   . Hyperlipidemia   . Hypertension    past hx of  . Iron malabsorption 09/26/2015  . Numbness and tingling    Hx; of in B/LLE and B/LUE  . Other iron deficiency anemias 09/20/2015    Current Outpatient Medications on File Prior to Visit  Medication Sig Dispense Refill  . aspirin 325 MG EC tablet Take 325 mg by mouth daily.    Marland Kitchen atorvastatin (LIPITOR) 20 MG tablet Take 1 tablet (20 mg total) by mouth at bedtime. 90 tablet 1  . Blood Glucose Monitoring Suppl (FREESTYLE LITE) DEVI Use to check blood sugar 3 times a day dx code E11.39 1 each 0  . Continuous Blood Gluc Sensor (FREESTYLE LIBRE 14 DAY SENSOR) MISC 1 Units by Does not apply route every 14 (fourteen) days. 2 each 4  . diclofenac (VOLTAREN) 75 MG EC tablet Take 75 mg by mouth. Takes 1 tablet on Mon and Thurs    . escitalopram (LEXAPRO) 5 MG tablet Take 5 mg by mouth 3 (three) times daily.     . fluticasone (FLONASE) 50 MCG/ACT nasal spray USE 2 SPRAY(S) IN EACH NOSTRIL ONCE DAILY 16 g 2  . fluticasone (FLOVENT HFA) 110 MCG/ACT inhaler Inhale 1 puff into the lungs 2 (two) times daily. 1 Inhaler 0  . FREESTYLE LITE test strip USE AS DIRECTED TO CHECK BLOOD SUGAR THREE TIMES DAILY 100 each 3  . gabapentin (NEURONTIN) 600 MG tablet  TAKE 2 TABLETS BY MOUTH THREE TIMES DAILY (Patient taking differently: TAKE 1200 mg  BY MOUTH THREE TIMES DAILY) 540 tablet 1  . hydrochlorothiazide (MICROZIDE) 12.5 MG capsule TAKE 1 CAPSULE BY MOUTH ONCE DAILY 30 capsule 1  . Insulin Disposable Pump (V-GO 30) KIT 1 Pump by Does not apply route daily. 1 kit 2  . insulin regular (NOVOLIN R RELION) 100 units/mL injection Take 6 units with breakfast, 8 units with lunch and 8 units with dinner (Patient taking differently: Inject 4-10 Units into the skin 3 (three) times daily before meals. Per meal size, small meals = less pumps to Vgo and large meal = more pumps to Vgo. 1 pump = 2 units.) 10 mL 3  . INSULIN SYRINGE 1CC/29G 29G X 1/2" 1 ML MISC Use 1 per day. 100 each 2  . Lancets (FREESTYLE) lancets Use as instructed to check blood sugar 3 times per day dx code E11.39 100 each 3  . metFORMIN (GLUCOPHAGE-XR) 500 MG 24 hr tablet Take 4 tablets (2,000 mg total) by mouth daily with supper. 120 tablet 3  . OVER THE COUNTER MEDICATION Take 2 each by mouth daily. Centrum-Muli. Vitamin Gummy    . pantoprazole (PROTONIX) 40 MG tablet TAKE 1 TABLET BY MOUTH ONCE DAILY 90 tablet 1   No current facility-administered medications  on file prior to visit.     Allergies  Allergen Reactions  . Keflex [Cephalexin]     Tremors, rash, hard to breathe  . Lithium Nausea And Vomiting and Other (See Comments)    Other reaction(s): Other (See Comments) Can not keep this medication down. It makes her terribly ill. Can not keep this medication down. It makes her terribly ill.  . Ativan [Lorazepam] Other (See Comments)    Other reaction(s): ANAPHYLAXIS Other reaction(s): Other (See Comments) Abnormal behavior Abnormal behavior  . Demerol [Meperidine] Other (See Comments)    Abnormal behavior (sees things) Other reaction(s): ANAPHYLAXIS Other reaction(s): Other (See Comments) Abnormal behavior Abnormal behavior  . Oxycodone Other (See Comments)    Other reaction(s):  OTHER Abnormal behavior  . Codeine Itching and Other (See Comments)    hallucinations  . Doxycycline Other (See Comments)    Severe muscle tremor  . Darvocet [Propoxyphene N-Acetaminophen] Itching  . Latex Itching    Other reaction(s): OTHER  . Propoxyphene Itching    Family History  Problem Relation Age of Onset  . Diabetes Mother   . Mental illness Mother   . Bipolar disorder Mother   . Hypertension Father   . Diabetes Maternal Grandmother   . Heart disease Neg Hx     Social History   Socioeconomic History  . Marital status: Married    Spouse name: None  . Number of children: 2  . Years of education: 29  . Highest education level: None  Social Needs  . Financial resource strain: None  . Food insecurity - worry: None  . Food insecurity - inability: None  . Transportation needs - medical: None  . Transportation needs - non-medical: None  Occupational History    Comment: disabled  Tobacco Use  . Smoking status: Never Smoker  . Smokeless tobacco: Never Used  Substance and Sexual Activity  . Alcohol use: No    Alcohol/week: 0.0 oz  . Drug use: No  . Sexual activity: No    Partners: Male  Other Topics Concern  . None  Social History Narrative   Lives with husband in home   Caffeine use - Coke 2 or 3 16 oz daily    Review of Systems - See HPI.  All other ROS are negative.  BP 128/82   Pulse 69   Temp 97.7 F (36.5 C) (Oral)   Resp 16   Ht 5' (1.524 m)   Wt 188 lb (85.3 kg)   SpO2 96%   BMI 36.72 kg/m   Physical Exam  Constitutional: She is oriented to person, place, and time and well-developed, well-nourished, and in no distress.  HENT:  Head: Normocephalic and atraumatic.  Eyes: Conjunctivae are normal.  Neck: Neck supple.  Cardiovascular: Normal rate, regular rhythm, normal heart sounds and intact distal pulses.  Pulmonary/Chest: Effort normal and breath sounds normal. No respiratory distress. She has no wheezes. She has no rales. She exhibits no  tenderness.  Neurological: She is alert and oriented to person, place, and time.  Skin: Skin is warm and dry. No rash noted.  Psychiatric: Affect normal.  Vitals reviewed.   Recent Results (from the past 2160 hour(s))  Fructosamine     Status: None   Collection Time: 04/15/17  4:19 PM  Result Value Ref Range   Fructosamine 275 0 - 285 umol/L    Comment: Published reference interval for apparently healthy subjects between age 16 and 6 is 55 - 285 umol/L and in a poorly  controlled diabetic population is 228 - 563 umol/L with a mean of 396 umol/L.   Basic metabolic panel     Status: Abnormal   Collection Time: 04/15/17  4:19 PM  Result Value Ref Range   Sodium 137 135 - 145 mEq/L   Potassium 5.3 (H) 3.5 - 5.1 mEq/L   Chloride 102 96 - 112 mEq/L   CO2 28 19 - 32 mEq/L   Glucose, Bld 149 (H) 70 - 99 mg/dL   BUN 25 (H) 6 - 23 mg/dL   Creatinine, Ser 0.97 0.40 - 1.20 mg/dL   Calcium 9.0 8.4 - 10.5 mg/dL   GFR 62.21 >60.00 mL/min  Basic metabolic panel     Status: Abnormal   Collection Time: 05/20/17 11:52 AM  Result Value Ref Range   Sodium 138 135 - 145 mEq/L   Potassium 5.7 (H) 3.5 - 5.1 mEq/L   Chloride 103 96 - 112 mEq/L   CO2 29 19 - 32 mEq/L   Glucose, Bld 267 (H) 70 - 99 mg/dL   BUN 22 6 - 23 mg/dL   Creatinine, Ser 0.91 0.40 - 1.20 mg/dL   Calcium 9.2 8.4 - 10.5 mg/dL   GFR 66.95 >60.00 mL/min  Basic metabolic panel     Status: Abnormal   Collection Time: 06/09/17  3:03 PM  Result Value Ref Range   Sodium 139 135 - 145 mEq/L   Potassium 5.4 (H) 3.5 - 5.1 mEq/L   Chloride 102 96 - 112 mEq/L   CO2 31 19 - 32 mEq/L   Glucose, Bld 146 (H) 70 - 99 mg/dL   BUN 29 (H) 6 - 23 mg/dL   Creatinine, Ser 1.15 0.40 - 1.20 mg/dL   Calcium 9.4 8.4 - 10.5 mg/dL   GFR 51.09 (L) >60.00 mL/min  POCT glycosylated hemoglobin (Hb A1C)     Status: None   Collection Time: 07/03/17 11:21 AM  Result Value Ref Range   Hemoglobin A1C 7.2     Assessment/Plan: 1. Community acquired  pneumonia, unspecified laterality Resolved clinically. Some residual mucous production. Start Mucinex BID. Increase fluids. Will repeat CXR today to ensure resolution of CAP. Will alter regimen based on imaging results.   - DG Chest 2 View; Future   [name], PA-C

## 2017-07-14 NOTE — Patient Instructions (Signed)
Please go to the [facility] for your follow-up x-ray. I will call you with your results.  Keep well-hydrated and get plenty of rest.  Use mucinex twice daily to thin out congestion.   We will alter regimen based on follow-up x-ray results.

## 2017-07-15 ENCOUNTER — Telehealth: Payer: Self-pay | Admitting: Physician Assistant

## 2017-07-15 DIAGNOSIS — B351 Tinea unguium: Secondary | ICD-10-CM | POA: Diagnosis not present

## 2017-07-15 DIAGNOSIS — L301 Dyshidrosis [pompholyx]: Secondary | ICD-10-CM | POA: Diagnosis not present

## 2017-07-15 NOTE — Telephone Encounter (Signed)
Pt given lab results per Eulah Pont, potassium 5.6, glucose 229, BUN 26; she verbalizes understanding; will also route to office with pt contact information in case additional instruction is required; pt can be contacted at [phone]

## 2017-07-16 DIAGNOSIS — Z89511 Acquired absence of right leg below knee: Secondary | ICD-10-CM | POA: Diagnosis not present

## 2017-07-16 DIAGNOSIS — Z9181 History of falling: Secondary | ICD-10-CM | POA: Diagnosis not present

## 2017-07-16 DIAGNOSIS — Z4781 Encounter for orthopedic aftercare following surgical amputation: Secondary | ICD-10-CM | POA: Diagnosis not present

## 2017-07-16 DIAGNOSIS — R2681 Unsteadiness on feet: Secondary | ICD-10-CM | POA: Diagnosis not present

## 2017-07-17 ENCOUNTER — Other Ambulatory Visit: Payer: Self-pay | Admitting: Emergency Medicine

## 2017-07-20 ENCOUNTER — Other Ambulatory Visit: Payer: Self-pay | Admitting: Physician Assistant

## 2017-07-20 DIAGNOSIS — E875 Hyperkalemia: Secondary | ICD-10-CM

## 2017-07-20 DIAGNOSIS — E113412 Type 2 diabetes mellitus with severe nonproliferative diabetic retinopathy with macular edema, left eye: Secondary | ICD-10-CM | POA: Diagnosis not present

## 2017-07-20 MED ORDER — FUROSEMIDE 20 MG PO TABS: 20.0000 mg | ORAL_TABLET | Freq: Every day | ORAL | 1 refills | Status: DC

## 2017-07-23 ENCOUNTER — Other Ambulatory Visit: Payer: Self-pay | Admitting: Endocrinology

## 2017-07-24 DIAGNOSIS — E113512 Type 2 diabetes mellitus with proliferative diabetic retinopathy with macular edema, left eye: Secondary | ICD-10-CM | POA: Diagnosis not present

## 2017-07-29 DIAGNOSIS — E113411 Type 2 diabetes mellitus with severe nonproliferative diabetic retinopathy with macular edema, right eye: Secondary | ICD-10-CM | POA: Diagnosis not present

## 2017-07-31 DIAGNOSIS — F3189 Other bipolar disorder: Secondary | ICD-10-CM | POA: Diagnosis not present

## 2017-08-03 DIAGNOSIS — E113511 Type 2 diabetes mellitus with proliferative diabetic retinopathy with macular edema, right eye: Secondary | ICD-10-CM | POA: Diagnosis not present

## 2017-08-05 ENCOUNTER — Other Ambulatory Visit (INDEPENDENT_AMBULATORY_CARE_PROVIDER_SITE_OTHER): Payer: PPO

## 2017-08-05 ENCOUNTER — Telehealth: Payer: Self-pay | Admitting: Endocrinology

## 2017-08-05 DIAGNOSIS — E875 Hyperkalemia: Secondary | ICD-10-CM

## 2017-08-05 LAB — BASIC METABOLIC PANEL
BUN: 28 mg/dL — AB (ref 6–23)
CO2: 31 mEq/L (ref 19–32)
CREATININE: 1.06 mg/dL (ref 0.40–1.20)
Calcium: 9.7 mg/dL (ref 8.4–10.5)
Chloride: 98 mEq/L (ref 96–112)
GFR: 56.1 mL/min — AB (ref >60.00)
Glucose, Bld: 376 mg/dL — ABNORMAL HIGH (ref 70–99)
Potassium: 5.5 mEq/L — ABNORMAL HIGH (ref 3.5–5.1)
Sodium: 137 mEq/L (ref 135–145)

## 2017-08-05 NOTE — Telephone Encounter (Signed)
If she is taking 4 tablets of metformin ER this is the same dose as before.  Please find out her blood sugar readings at different times for the last 3 days

## 2017-08-05 NOTE — Telephone Encounter (Signed)
Please advise 

## 2017-08-05 NOTE — Telephone Encounter (Signed)
Patient stated since her metformin dosages been changed her b/s has been running 250 to 500, please advise what to do   Just took it today @ 1:13 pm 373

## 2017-08-07 ENCOUNTER — Other Ambulatory Visit: Payer: Self-pay

## 2017-08-07 DIAGNOSIS — E875 Hyperkalemia: Secondary | ICD-10-CM

## 2017-08-10 NOTE — Telephone Encounter (Signed)
Left detailed message with advice from note below and requested a call back with the bs readings

## 2017-08-11 ENCOUNTER — Telehealth: Payer: Self-pay | Admitting: Physician Assistant

## 2017-08-11 DIAGNOSIS — Z89511 Acquired absence of right leg below knee: Secondary | ICD-10-CM | POA: Diagnosis not present

## 2017-08-11 DIAGNOSIS — Z4781 Encounter for orthopedic aftercare following surgical amputation: Secondary | ICD-10-CM | POA: Diagnosis not present

## 2017-08-11 DIAGNOSIS — R2681 Unsteadiness on feet: Secondary | ICD-10-CM | POA: Diagnosis not present

## 2017-08-11 DIAGNOSIS — Z9181 History of falling: Secondary | ICD-10-CM | POA: Diagnosis not present

## 2017-08-11 NOTE — Telephone Encounter (Signed)
Patient has been off and on lithium for the last 6 months, and the vomiting has been going on every time she takes it.  She is going to call the provider who prescribes this for her and let them know.  If she cannot get anything new from them she is aware to call our office and schedule an appointment with [facility].

## 2017-08-11 NOTE — Telephone Encounter (Signed)
She needs to talk to the prescribing provider regarding options for the lithium. The vomiting has an impact on electrolyte levels. How long has this being going on as this is the first mention of this to me?

## 2017-08-11 NOTE — Telephone Encounter (Signed)
Copied from [place] [id number]. Topic: Quick Communication - See Telephone Encounter >> Aug 11, 2017  9:12 AM Boyd Kerbs wrote: CRM for notification.   Pt. Is asking if she still needs to take the B Complex pill - her potassium is running high.   Please call pt. And let her know.  See Telephone encounter for: 08/11/17.

## 2017-08-11 NOTE — Telephone Encounter (Signed)
Patient aware of notes on B-Complex.  Patient also asking about Lithium. She states that she cannot take that medication because it makes her throw up constantly. She is asking if we know of anything else to suggest for her to be able to get it down and keep it down.

## 2017-08-11 NOTE — Telephone Encounter (Signed)
Please advise pt. Re: question of continuing B Complex supplement, with elevated potassium.  Phone # [phone]

## 2017-08-11 NOTE — Telephone Encounter (Signed)
Most do not contain potassium but if she notes her does then she needs to stop.

## 2017-08-12 DIAGNOSIS — E113512 Type 2 diabetes mellitus with proliferative diabetic retinopathy with macular edema, left eye: Secondary | ICD-10-CM | POA: Diagnosis not present

## 2017-08-17 ENCOUNTER — Telehealth: Payer: Self-pay | Admitting: Physician Assistant

## 2017-08-17 ENCOUNTER — Other Ambulatory Visit: Payer: PPO

## 2017-08-17 NOTE — Telephone Encounter (Signed)
Copied from [place]. Topic: Quick Communication - Appointment Cancellation >> Aug 17, 2017  9:23 AM Dana [name] wrote: Patient called to cancel appointment scheduled for lab today Patient has  rescheduled their appointment.  Route to department's PEC pool.

## 2017-08-17 NOTE — Telephone Encounter (Signed)
Copied from [place]. Topic: Quick Communication - See Telephone Encounter >> Aug 17, 2017 10:30 AM Hewitt Shorts wrote: CRM for notification. See Telephone encounter for: 08/17/17.pt is needing a refill on her lasix and gabapetin [facility] [address]   Best number [phone]

## 2017-08-19 ENCOUNTER — Other Ambulatory Visit: Payer: Self-pay | Admitting: Physician Assistant

## 2017-08-19 NOTE — Telephone Encounter (Signed)
Last OV- 07/14/2017 Fill Date- 01/05/2017

## 2017-08-21 ENCOUNTER — Other Ambulatory Visit: Payer: PPO

## 2017-08-26 DIAGNOSIS — E113511 Type 2 diabetes mellitus with proliferative diabetic retinopathy with macular edema, right eye: Secondary | ICD-10-CM | POA: Diagnosis not present

## 2017-08-27 DIAGNOSIS — M21072 Valgus deformity, not elsewhere classified, left ankle: Secondary | ICD-10-CM | POA: Diagnosis not present

## 2017-08-27 DIAGNOSIS — E11621 Type 2 diabetes mellitus with foot ulcer: Secondary | ICD-10-CM | POA: Diagnosis not present

## 2017-08-27 DIAGNOSIS — Z89412 Acquired absence of left great toe: Secondary | ICD-10-CM | POA: Diagnosis not present

## 2017-08-31 ENCOUNTER — Other Ambulatory Visit: Payer: PPO

## 2017-09-04 DIAGNOSIS — F3189 Other bipolar disorder: Secondary | ICD-10-CM | POA: Diagnosis not present

## 2017-09-10 DIAGNOSIS — E113512 Type 2 diabetes mellitus with proliferative diabetic retinopathy with macular edema, left eye: Secondary | ICD-10-CM | POA: Diagnosis not present

## 2017-09-14 ENCOUNTER — Other Ambulatory Visit: Payer: Self-pay | Admitting: Physician Assistant

## 2017-09-14 DIAGNOSIS — E875 Hyperkalemia: Secondary | ICD-10-CM

## 2017-09-24 DIAGNOSIS — Z89511 Acquired absence of right leg below knee: Secondary | ICD-10-CM | POA: Diagnosis not present

## 2017-09-24 DIAGNOSIS — R2681 Unsteadiness on feet: Secondary | ICD-10-CM | POA: Diagnosis not present

## 2017-09-24 DIAGNOSIS — Z9181 History of falling: Secondary | ICD-10-CM | POA: Diagnosis not present

## 2017-09-24 DIAGNOSIS — Z4781 Encounter for orthopedic aftercare following surgical amputation: Secondary | ICD-10-CM | POA: Diagnosis not present

## 2017-09-25 ENCOUNTER — Other Ambulatory Visit: Payer: Self-pay

## 2017-09-25 ENCOUNTER — Other Ambulatory Visit (INDEPENDENT_AMBULATORY_CARE_PROVIDER_SITE_OTHER): Payer: PPO

## 2017-09-25 ENCOUNTER — Ambulatory Visit (INDEPENDENT_AMBULATORY_CARE_PROVIDER_SITE_OTHER): Payer: PPO | Admitting: Physician Assistant

## 2017-09-25 ENCOUNTER — Encounter: Payer: Self-pay | Admitting: Internal Medicine

## 2017-09-25 ENCOUNTER — Encounter: Payer: Self-pay | Admitting: Physician Assistant

## 2017-09-25 ENCOUNTER — Other Ambulatory Visit: Payer: PPO

## 2017-09-25 VITALS — BP 100/62 | HR 79 | Temp 98.5°F | Resp 14 | Ht 60.0 in | Wt 186.0 lb

## 2017-09-25 DIAGNOSIS — Z91419 Personal history of unspecified adult abuse: Secondary | ICD-10-CM | POA: Diagnosis not present

## 2017-09-25 DIAGNOSIS — K529 Noninfective gastroenteritis and colitis, unspecified: Secondary | ICD-10-CM

## 2017-09-25 DIAGNOSIS — E785 Hyperlipidemia, unspecified: Secondary | ICD-10-CM

## 2017-09-25 DIAGNOSIS — L409 Psoriasis, unspecified: Secondary | ICD-10-CM

## 2017-09-25 LAB — CBC WITH DIFFERENTIAL/PLATELET
BASOS ABS: 0 10*3/uL (ref 0.0–0.1)
BASOS PCT: 0.4 % (ref 0.0–3.0)
EOS ABS: 0.3 10*3/uL (ref 0.0–0.7)
Eosinophils Relative: 3.4 % (ref 0.0–5.0)
HEMATOCRIT: 33.4 % — AB (ref 36.0–46.0)
HEMOGLOBIN: 11.7 g/dL — AB (ref 12.0–15.0)
LYMPHS PCT: 26.4 % (ref 12.0–46.0)
Lymphs Abs: 1.9 10*3/uL (ref 0.7–4.0)
MCHC: 35 g/dL (ref 30.0–36.0)
MCV: 93.4 fl (ref 78.0–100.0)
MONOS PCT: 9 % (ref 3.0–12.0)
Monocytes Absolute: 0.7 10*3/uL (ref 0.1–1.0)
Neutro Abs: 4.5 10*3/uL (ref 1.4–7.7)
Neutrophils Relative %: 60.8 % (ref 43.0–77.0)
Platelets: 298 10*3/uL (ref 150.0–400.0)
RBC: 3.57 Mil/uL — ABNORMAL LOW (ref 3.87–5.11)
RDW: 12.8 % (ref 11.5–15.5)
WBC: 7.3 10*3/uL (ref 4.0–10.5)

## 2017-09-25 LAB — BASIC METABOLIC PANEL
BUN: 21 mg/dL (ref 6–23)
CALCIUM: 9 mg/dL (ref 8.4–10.5)
CO2: 29 mEq/L (ref 19–32)
Chloride: 100 mEq/L (ref 96–112)
Creatinine, Ser: 0.99 mg/dL (ref 0.40–1.20)
GFR: 60.67 mL/min (ref >60.00)
GLUCOSE: 297 mg/dL — AB (ref 70–99)
Potassium: 4.1 mEq/L (ref 3.5–5.1)
SODIUM: 138 meq/L (ref 135–145)

## 2017-09-25 LAB — TSH: TSH: 1.02 u[IU]/mL (ref 0.35–4.50)

## 2017-09-25 NOTE — Patient Instructions (Signed)
Please go to the lab today for blood work.  I will call you with your results. We will alter treatment regimen(s) if indicated by your results.   Please use the handout given to schedule an appointment with counseling.  This will be very beneficial. You will be contacted for assessment by Gastroenterology due to the chronic change in your bowels.  Please increase the fiber in your diet to help bulk stools. A daily benefiber supplement will be good. Make sure to drink plenty of water to help with this. We will alter treatment regimen based on results.   I would like to see you in a month to see how things are going in terms of mood. Never hesitate to call us if you need to discuss how you are feeling.
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
## 2017-09-28 ENCOUNTER — Telehealth: Payer: Self-pay

## 2017-09-28 NOTE — Telephone Encounter (Signed)
Called Dana [name] on her cell phone number [phone] and left a voice message to let her know to please come in at 11am on 10/30/17 for her appointment with [facility].

## 2017-09-29 ENCOUNTER — Ambulatory Visit (INDEPENDENT_AMBULATORY_CARE_PROVIDER_SITE_OTHER): Payer: PPO | Admitting: Endocrinology

## 2017-09-29 ENCOUNTER — Encounter: Payer: Self-pay | Admitting: Endocrinology

## 2017-09-29 VITALS — BP 132/68 | HR 80 | Ht 60.0 in | Wt 189.2 lb

## 2017-09-29 DIAGNOSIS — Z794 Long term (current) use of insulin: Secondary | ICD-10-CM

## 2017-09-29 DIAGNOSIS — E118 Type 2 diabetes mellitus with unspecified complications: Secondary | ICD-10-CM | POA: Diagnosis not present

## 2017-09-29 LAB — POCT GLYCOSYLATED HEMOGLOBIN (HGB A1C): HEMOGLOBIN A1C: 10.8 % — AB (ref 4.0–5.6)

## 2017-09-29 NOTE — Progress Notes (Signed)
Patient ID: Dana [name], female   DOB: 12/07/1955, 62 y.o.   MRN: [id number]           Reason for Appointment: Follow-up for Type 2 Diabetes  Referring physician: Elyn Aquas  History of Present Illness:          Date of diagnosis of type 2 diabetes mellitus: 2006       Background history:   She thinks her blood sugar was 300-400 at the time of diagnosis and she was started on insulin soon after this. She thinks she has been on metformin only for the last 5 years Her A1c history is available since only about 2013 and this had been consistently over 10% She  had  poor control of her diabetes with pre-consultation A1c of 13%, was on Levemir only  Although she did better with the V-go pump 30 in 2016 she stopped this because of increased cost after a short time  Recent history:    Her A1c is 10.8, previously 7.2, has been as low as 6.7,  INSULIN regimen is: Regular insulin 4 times a day, previously using V-go pump, 30 unit basal, boluses 6 units at breakfast, 8 at lunch and 10-12 at dinner  Current blood sugar patterns, daily management and problems identified:  She has not been using the freestyle Libre sensor because it was not sticking well  Also for about 3 weeks she has not used her V-go pump saying that it was not controlling her blood sugars and she would have some issues with the needle coming out on its own and her having to discard the pump  She thinks that when she was wearing the pump her fasting readings were also high over 200  She was not able to get through to the support hotline and did not call us regarding her insulin doses  With this her blood sugars are totally out of control  Also her glucose monitor has the wrong date programmed and not clear which readings are recent  She is able to take metformin without side effects, currently taking regular metformin twice a day                         Non-insulin hypoglycemic drugs the patient is taking are:  Metformin 1g bid  Side effects from medications have been: ? diarrhea from metformin   Compliance with the medical regimen: Good Hypoglycemia: As above    Glucose monitoring:  done 2-3 times a day         Glucometer:  FreeStyle libre and Freestyle lite .      Blood Glucose readings show that her average for the last month is 333  Not clear which readings are recent, has a range the last few days of 66-215  Self-care:  Typical meal intake: Breakfast is irregular otherwise may have toast and an egg or sausage.  Lunch is a sandwich or cheeseburger, evening meal is meat and 2 vegetables.  For snacks  will have peanut butter crackers in pms Dinner 5-7 pm     She has regular drinks almost every day          Dietician visit, most recent: Never               Exercise: none  Weight history: Stable over the last 3-4 years, was 400 pounds about 5 years ago  Wt Readings from Last 3 Encounters:  09/29/17 189 lb 3.2 oz (85.8 kg)  09/25/17 186 lb (84.4 kg)  07/14/17 188 lb (85.3 kg)    Glycemic control:   Lab Results  Component Value Date   HGBA1C 10.8 (A) 09/29/2017   HGBA1C 7.2 07/03/2017   HGBA1C 7.4 03/04/2017   Lab Results  Component Value Date   MICROALBUR 0.8 10/01/2016   LDLCALC 77 10/01/2016   CREATININE 0.99 09/25/2017       Allergies as of 09/29/2017      Reactions   Keflex [cephalexin]    Tremors, rash, hard to breathe   Lithium Nausea And Vomiting, Other (See Comments)   Other reaction(s): Other (See Comments) Can not keep this medication down. It makes her terribly ill. Can not keep this medication down. It makes her terribly ill.   Ativan [lorazepam] Other (See Comments)   Other reaction(s): ANAPHYLAXIS Other reaction(s): Other (See Comments) Abnormal behavior Abnormal behavior   Demerol [meperidine] Other (See Comments)   Abnormal behavior (sees things) Other reaction(s): ANAPHYLAXIS Other reaction(s): Other (See Comments) Abnormal behavior Abnormal  behavior   Oxycodone Other (See Comments)   Other reaction(s): OTHER Abnormal behavior   Codeine Itching, Other (See Comments)   hallucinations   Doxycycline Other (See Comments)   Severe muscle tremor   Darvocet [propoxyphene N-acetaminophen] Itching   Latex Itching   Other reaction(s): OTHER   Propoxyphene Itching      Medication List        Accurate as of 09/29/17  1:47 PM. Always use your most recent med list.          aspirin 325 MG EC tablet Take 325 mg by mouth daily.   atorvastatin 20 MG tablet Commonly known as:  LIPITOR Take 1 tablet (20 mg total) by mouth at bedtime.   escitalopram 5 MG tablet Commonly known as:  LEXAPRO Take 5 mg by mouth 3 (three) times daily.   fluticasone 110 MCG/ACT inhaler Commonly known as:  FLOVENT HFA Inhale 1 puff into the lungs 2 (two) times daily.   freestyle lancets Use as instructed to check blood sugar 3 times per day dx code E11.39   FREESTYLE LIBRE 14 DAY SENSOR Misc 1 Units by Does not apply route every 14 (fourteen) days.   FREESTYLE LITE Devi Use to check blood sugar 3 times a day dx code E11.39   FREESTYLE LITE test strip Generic drug:  glucose blood USE AS DIRECTED TO CHECK BLOOD SUGAR THREE TIMES DAILY   furosemide 20 MG tablet Commonly known as:  LASIX TAKE 1 TABLET BY MOUTH ONCE DAILY   gabapentin 600 MG tablet Commonly known as:  NEURONTIN TAKE 1200 mg  BY MOUTH THREE TIMES DAILY   insulin regular 100 units/mL injection Commonly known as:  NOVOLIN R RELION Take 6 units with breakfast, 8 units with lunch and 8 units with dinner   INSULIN SYRINGE 1CC/29G 29G X 1/2" 1 ML Misc Use 1 per day.   metFORMIN 500 MG 24 hr tablet Commonly known as:  GLUCOPHAGE-XR Take 4 tablets (2,000 mg total) by mouth daily with supper.   OVER THE COUNTER MEDICATION Take 2 each by mouth daily. Centrum-Muli. Vitamin Gummy   pantoprazole 40 MG tablet Commonly known as:  PROTONIX TAKE 1 TABLET BY MOUTH ONCE DAILY     V-GO 30 Kit USE AS DIRECTED       Allergies:  Allergies  Allergen Reactions  . Keflex [Cephalexin]     Tremors, rash, hard to breathe  . Lithium Nausea And Vomiting and Other (See Comments)  Other reaction(s): Other (See Comments) Can not keep this medication down. It makes her terribly ill. Can not keep this medication down. It makes her terribly ill.  . Ativan [Lorazepam] Other (See Comments)    Other reaction(s): ANAPHYLAXIS Other reaction(s): Other (See Comments) Abnormal behavior Abnormal behavior  . Demerol [Meperidine] Other (See Comments)    Abnormal behavior (sees things) Other reaction(s): ANAPHYLAXIS Other reaction(s): Other (See Comments) Abnormal behavior Abnormal behavior  . Oxycodone Other (See Comments)    Other reaction(s): OTHER Abnormal behavior  . Codeine Itching and Other (See Comments)    hallucinations  . Doxycycline Other (See Comments)    Severe muscle tremor  . Darvocet [Propoxyphene N-Acetaminophen] Itching  . Latex Itching    Other reaction(s): OTHER  . Propoxyphene Itching    Past Medical History:  Diagnosis Date  . Bipolar 1 disorder ([place])   . Cellulitis and abscess of foot 03/08/2015  . Diabetes mellitus    INSULIN DEPENDENT  . Diabetic neuropathy ([place])   . Diabetic neuropathy ([place])   . 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 H/O hiatal hernia   . Hyperlipidemia   . Hypertension    past hx of  . Iron malabsorption 09/26/2015  . Numbness and tingling    Hx; of in B/LLE and B/LUE  . Other iron deficiency anemias 09/20/2015    Past Surgical History:  Procedure Laterality Date  . ABDOMINAL HYSTERECTOMY    . ADENOIDECTOMY     Hx: of  . AMPUTATION  10/24/2011   Procedure: AMPUTATION RAY;  Surgeon: Newt Minion, MD;  Location: [place];  Service: Orthopedics;  Laterality: Right;  Right Foot 3rd Ray Amputation  . AMPUTATION Right 12/16/2012   Procedure: Right Foot Transmetatarsal Amputation;  Surgeon: Newt Minion, MD;   Location: [place];  Service: Orthopedics;  Laterality: Right;  . AMPUTATION Left 03/09/2015   Procedure: LEFT FOOT 1ST RAY AMPUTATION;  Surgeon: Newt Minion, MD;  Location: [place];  Service: Orthopedics;  Laterality: Left;  . BLADDER SURGERY     x 2, tacked 1st time; mesh "eroded", had to be removed  . Bladder Tact   2002  . BREAST SURGERY Left    "knot removed"  . CARPAL TUNNEL RELEASE Left   . COLON SURGERY    . NASAL SEPTUM SURGERY  1976  . TONSILLECTOMY     age 64's    Family History  Problem Relation Age of Onset  . Diabetes Mother   . Mental illness Mother   . Bipolar disorder Mother   . Hypertension Father   . Diabetes Maternal Grandmother   . Heart disease Neg Hx     Social History:  reports that she has never smoked. She has never used smokeless tobacco. She reports that she does not drink alcohol or use drugs.    Review of Systems    The following is a copy of the previous No  Has diabetic neuropathy with sensory loss Taking  638m gabapentin and analgesics from PCP  DIABETIC foot ulcers with history of amputations  On the right leg she has a prosthesis after her BKA, able to walk   Lipid history: On Lipitor and followed by PCP    Lab Results  Component Value Date   CHOL 128 01/20/2017   HDL 40.10 10/01/2016   LDLCALC 77 10/01/2016   TRIG 175.0 (H) 10/01/2016   CHOLHDL 4 10/01/2016             Physical Examination:  BP 132/68 (BP Location: Left Arm, Patient Position: Sitting, Cuff Size: Normal)   Pulse 80   Ht 5' (1.524 m)   Wt 189 lb 3.2 oz (85.8 kg)   SpO2 92%   BMI 36.95 kg/m        ASSESSMENT:  Diabetes type 2, uncontrolled     See history of present illness for detailed discussion of  current management, blood sugar patterns and problems identified  Her blood sugars are totally out of control with A1c over 10% Previously doing well for some time with her V-go pump and A1c mostly just over 7% She thinks she has had poor control  with the V-go pump even with overnight readings but also having some technical problems Now with using all the regular insulin 4 times a day and no basal insulin her blood sugars are totally out of control  Recommendations: Given her sample of the 40 unit pump to try and keep the same boluses She will call us next week to report her readings She will need to discuss issues with her freestyle Elenor Legato with the manufacture Also will have diabetes nurse educator call her for support  There are no Patient Instructions on file for this visit.     Elayne Snare 09/29/2017, 1:47 PM   Note: This office note was prepared with Dragon voice recognition system technology. Any transcriptional errors that result from this process are unintentional.

## 2017-09-30 DIAGNOSIS — B351 Tinea unguium: Secondary | ICD-10-CM | POA: Diagnosis not present

## 2017-10-01 ENCOUNTER — Ambulatory Visit: Payer: PPO | Admitting: Endocrinology

## 2017-10-06 ENCOUNTER — Ambulatory Visit: Payer: Self-pay | Admitting: *Deleted

## 2017-10-06 NOTE — Telephone Encounter (Signed)
Patient calls with left lower leg-just above the ankle, swelling and discoloration. She stated it has been swelling for at least 2 weeks but has recently increased in swelling and discoloration. She reports discussing the edema with Elyn Aquas at her LOV on 5/17 (not 4/13 as noted below.) She reports  taking Lasix 20 MG twice a day with good urine output daily. Advised to elevate extremity several times a day which she had not been doing. She denies calf pain/pain in general/SOB. Appointment made for tomorrow.    Reason for Disposition . [1] MILD swelling of both ankles (i.e., pedal edema) AND [2] new onset or worsening  Answer Assessment - Initial Assessment Questions 1. ONSET: "When did the swelling start?" (e.g., minutes, hours, days)    Saw Elyn Aquas on 08/22/17 leg edema 2. LOCATION: "What part of the leg is swollen?"  "Are both legs swollen or just one leg?"     Left lower leg just above the ankle. Other leg is prosthesis. 3. SEVERITY: "How bad is the swelling?" (e.g., localized; mild, moderate, severe)  - Localized - small area of swelling localized to one leg  - MILD pedal edema - swelling limited to foot and ankle, pitting edema < 1/4 inch (6 mm) deep, rest and elevation eliminate most or all swelling  - MODERATE edema - swelling of lower leg to knee, pitting edema > 1/4 inch (6 mm) deep, rest and elevation only partially reduce swelling  - SEVERE edema - swelling extends above knee, facial or hand swelling present      Localized Left lower leg just above the ankle swelling and discoloration has been increasing since May 17th 4. REDNESS: "Does the swelling look red or infected?"     red 5. PAIN: "Is the swelling painful to touch?" If so, ask: "How painful is it?"   (Scale 1-10; mild, moderate or severe)     no 6. FEVER: "Do you have a fever?" If so, ask: "What is it, how was it measured, and when did it start?"      no 7. CAUSE: "What do you think is causing the leg swelling?"  fluid 8. MEDICAL HISTORY: "Do you have a history of heart failure, kidney disease, liver failure, or cancer?"      9. RECURRENT SYMPTOM: "Have you had leg swelling before?" If so, ask: "When was the last time?" "What happened that time?"     Yes, earlier this month. 10. OTHER SYMPTOMS: "Do you have any other symptoms?" (e.g., chest pain, difficulty breathing)       no 11. PREGNANCY: "Is there any chance you are pregnant?" "When was your last menstrual period?"       no  Protocols used: LEG SWELLING AND EDEMA-A-AH

## 2017-10-07 ENCOUNTER — Telehealth: Payer: Self-pay | Admitting: Physician Assistant

## 2017-10-07 ENCOUNTER — Ambulatory Visit (HOSPITAL_BASED_OUTPATIENT_CLINIC_OR_DEPARTMENT_OTHER)
Admission: RE | Admit: 2017-10-07 | Discharge: 2017-10-07 | Disposition: A | Discharge status: Home / Self Care | Payer: PPO | Source: Ambulatory Visit | Attending: Physician Assistant | Admitting: Physician Assistant

## 2017-10-07 ENCOUNTER — Encounter: Payer: Self-pay | Admitting: Physician Assistant

## 2017-10-07 ENCOUNTER — Other Ambulatory Visit: Payer: Self-pay

## 2017-10-07 ENCOUNTER — Ambulatory Visit: Payer: Self-pay | Admitting: Physician Assistant

## 2017-10-07 ENCOUNTER — Ambulatory Visit (INDEPENDENT_AMBULATORY_CARE_PROVIDER_SITE_OTHER): Payer: PPO | Admitting: Physician Assistant

## 2017-10-07 VITALS — BP 98/66 | HR 76 | Temp 98.0°F | Resp 15 | Ht 60.0 in | Wt 185.5 lb

## 2017-10-07 DIAGNOSIS — M7989 Other specified soft tissue disorders: Secondary | ICD-10-CM

## 2017-10-07 DIAGNOSIS — E875 Hyperkalemia: Secondary | ICD-10-CM | POA: Diagnosis not present

## 2017-10-07 DIAGNOSIS — M7122 Synovial cyst of popliteal space [Baker], left knee: Secondary | ICD-10-CM | POA: Insufficient documentation

## 2017-10-07 MED ORDER — FUROSEMIDE 20 MG PO TABS: 20.0000 mg | ORAL_TABLET | Freq: Two times a day (BID) | ORAL | 1 refills | Status: DC

## 2017-10-07 NOTE — Patient Instructions (Signed)
Please stop by the front desk to speak with Dana [name] to schedule your Ultrasound.  I will call with results and we will alter your treatment regimen accordingly.  Keep foot elevated while resting. Continue fluid pills as directed. I have sent in a refill.   For the eye, please apply cold compresses to the area.  You have had a mild reaction to the soap you got into your eye. Take a non-drowsy claritin to help with the swelling. This should continue to improve/resolve over the next 24-48 hours.  If not or anything worsens, please come see me ASAP.

## 2017-10-07 NOTE — Telephone Encounter (Signed)
Patient calling back to obtain xray results. Please advise. CB#: [phone]    Copied from [place] [phone]. Topic: Quick Communication - Lab Results >> Oct 07, 2017  4:59 PM Sigurd Sos, LPN wrote: Called patient to inform them of 10/07/2017 lab results. When patient returns call, triage nurse may disclose results.

## 2017-10-07 NOTE — Telephone Encounter (Signed)
PCP has results in his inbasket.

## 2017-10-07 NOTE — Telephone Encounter (Signed)
Eliezer Lofts, Radiology Technician from [facility] called with the [place] Lower Extremity report, result read back and verified per result in chart. Amy, FC called and notified the results are available and the patient is waiting at the radiology department, conference Eliezer Lofts to Amy.

## 2017-10-07 NOTE — Progress Notes (Signed)
Patient presents to clinic today c/o a swollen region of anterior left shin starting 4 days ago. Denies known trauma or injury but notes she was playing with her dog before onset of symptoms. Denies any leg pain or itching. Notes some mild redness at the site of swelling. Denies fever, chills, malaise or fatigue. Patient with history of PVD and venous insufficiency, taking her Lasix as directed BID. Notes extremities are not swollen elsewhere. Denies chest pain or SOB. Denies recent procedure.   Past Medical History:  Diagnosis Date  . Bipolar 1 disorder ([place])   . Cellulitis and abscess of foot 03/08/2015  . Diabetes mellitus    INSULIN DEPENDENT  . Diabetic neuropathy ([place])   . Diabetic neuropathy ([place])   . 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 H/O hiatal hernia   . Hyperlipidemia   . Hypertension    past hx of  . Iron malabsorption 09/26/2015  . Numbness and tingling    Hx; of in B/LLE and B/LUE  . Other iron deficiency anemias 09/20/2015    Current Outpatient Medications on File Prior to Visit  Medication Sig Dispense Refill  . aspirin 325 MG EC tablet Take 325 mg by mouth daily.    Marland Kitchen atorvastatin (LIPITOR) 20 MG tablet Take 1 tablet (20 mg total) by mouth at bedtime. 90 tablet 1  . Blood Glucose Monitoring Suppl (FREESTYLE LITE) DEVI Use to check blood sugar 3 times a day dx code E11.39 1 each 0  . Continuous Blood Gluc Sensor (FREESTYLE LIBRE 14 DAY SENSOR) MISC 1 Units by Does not apply route every 14 (fourteen) days. 2 each 4  . escitalopram (LEXAPRO) 5 MG tablet Take 5 mg by mouth 3 (three) times daily.     Marland Kitchen FREESTYLE LITE test strip USE AS DIRECTED TO CHECK BLOOD SUGAR THREE TIMES DAILY 100 each 3  . furosemide (LASIX) 20 MG tablet TAKE 1 TABLET BY MOUTH ONCE DAILY (Patient taking differently: TAKE 1 TABLET BY MOUTH TWICE DAILY) 30 tablet 1  . gabapentin (NEURONTIN) 600 MG tablet TAKE 1200 mg  BY MOUTH THREE TIMES DAILY 540 tablet 0  . insulin regular (NOVOLIN R  RELION) 100 units/mL injection Take 6 units with breakfast, 8 units with lunch and 8 units with dinner (Patient taking differently: Inject 4-10 Units into the skin 3 (three) times daily before meals. Per meal size, small meals = less pumps to Vgo and large meal = more pumps to Vgo. 1 pump = 2 units.) 10 mL 3  . INSULIN SYRINGE 1CC/29G 29G X 1/2" 1 ML MISC Use 1 per day. 100 each 2  . Lancets (FREESTYLE) lancets Use as instructed to check blood sugar 3 times per day dx code E11.39 100 each 3  . metFORMIN (GLUCOPHAGE-XR) 500 MG 24 hr tablet Take 4 tablets (2,000 mg total) by mouth daily with supper. (Patient taking differently: Take 2,000 mg by mouth daily with supper. ) 120 tablet 3  . OVER THE COUNTER MEDICATION Take 2 each by mouth daily. Centrum-Muli. Vitamin Gummy    . pantoprazole (PROTONIX) 40 MG tablet TAKE 1 TABLET BY MOUTH ONCE DAILY 90 tablet 1  . fluticasone (FLOVENT HFA) 110 MCG/ACT inhaler Inhale 1 puff into the lungs 2 (two) times daily. (Patient not taking: Reported on 10/07/2017) 1 Inhaler 0  . Insulin Disposable Pump (V-GO 30) KIT USE AS DIRECTED (Patient not taking: Reported on 09/29/2017) 30 kit 2   No current facility-administered medications on file prior to visit.  Allergies  Allergen Reactions  . Keflex [Cephalexin]     Tremors, rash, hard to breathe  . Lithium Nausea And Vomiting and Other (See Comments)    Other reaction(s): Other (See Comments) Can not keep this medication down. It makes her terribly ill. Can not keep this medication down. It makes her terribly ill.  . Ativan [Lorazepam] Other (See Comments)    Other reaction(s): ANAPHYLAXIS Other reaction(s): Other (See Comments) Abnormal behavior Abnormal behavior  . Demerol [Meperidine] Other (See Comments)    Abnormal behavior (sees things) Other reaction(s): ANAPHYLAXIS Other reaction(s): Other (See Comments) Abnormal behavior Abnormal behavior  . Oxycodone Other (See Comments)    Other reaction(s):  OTHER Abnormal behavior  . Codeine Itching and Other (See Comments)    hallucinations  . Doxycycline Other (See Comments)    Severe muscle tremor  . Darvocet [Propoxyphene N-Acetaminophen] Itching  . Latex Itching    Other reaction(s): OTHER  . Propoxyphene Itching    Family History  Problem Relation Age of Onset  . Diabetes Mother   . Mental illness Mother   . Bipolar disorder Mother   . Hypertension Father   . Diabetes Maternal Grandmother   . Heart disease Neg Hx     Social History   Socioeconomic History  . Marital status: Married    Spouse name: Not on file  . Number of children: 2  . Years of education: 53  . Highest education level: Not on file  Occupational History    Comment: disabled  Social Needs  . Financial resource strain: Not on file  . Food insecurity:    Worry: Not on file    Inability: Not on file  . Transportation needs:    Medical: Not on file    Non-medical: Not on file  Tobacco Use  . Smoking status: Never Smoker  . Smokeless tobacco: Never Used  Substance and Sexual Activity  . Alcohol use: No    Alcohol/week: 0.0 oz  . Drug use: No  . Sexual activity: Never    Partners: Male  Lifestyle  . Physical activity:    Days per week: Not on file    Minutes per session: Not on file  . Stress: Not on file  Relationships  . Social connections:    Talks on phone: Not on file    Gets together: Not on file    Attends religious service: Not on file    Active member of club or organization: Not on file    Attends meetings of clubs or organizations: Not on file    Relationship status: Not on file  Other Topics Concern  . Not on file  Social History Narrative   Lives with husband in home   Caffeine use - Coke 2 or 3 16 oz daily    Review of Systems - See HPI.  All other ROS are negative.  BP 98/66   Pulse 76   Temp 98 F (36.7 C) (Oral)   Resp 15   Ht 5' (1.524 m)   Wt 185 lb 8 oz (84.1 kg)   SpO2 98%   BMI 36.23 kg/m   Physical  Exam  Recent Results (from the past 2160 hour(s))  Basic metabolic panel     Status: Abnormal   Collection Time: 07/14/17  2:35 PM  Result Value Ref Range   Sodium 137 135 - 145 mEq/L   Potassium 5.6 (H) 3.5 - 5.1 mEq/L   Chloride 101 96 - 112 mEq/L  CO2 32 19 - 32 mEq/L   Glucose, Bld 229 (H) 70 - 99 mg/dL   BUN 26 (H) 6 - 23 mg/dL   Creatinine, Ser 0.96 0.40 - 1.20 mg/dL   Calcium 9.5 8.4 - 10.5 mg/dL   GFR 62.91 >60.00 mL/min  Basic metabolic panel     Status: Abnormal   Collection Time: 08/05/17 10:14 AM  Result Value Ref Range   Sodium 137 135 - 145 mEq/L   Potassium 5.5 (H) 3.5 - 5.1 mEq/L   Chloride 98 96 - 112 mEq/L   CO2 31 19 - 32 mEq/L   Glucose, Bld 376 (H) 70 - 99 mg/dL   BUN 28 (H) 6 - 23 mg/dL   Creatinine, Ser 1.06 0.40 - 1.20 mg/dL   Calcium 9.7 8.4 - 10.5 mg/dL   GFR 56.10 (L) >60.00 mL/min  TSH     Status: None   Collection Time: 09/25/17  2:25 PM  Result Value Ref Range   TSH 1.02 0.35 - 4.50 uIU/mL  CBC w/Diff     Status: Abnormal   Collection Time: 09/25/17  2:25 PM  Result Value Ref Range   WBC 7.3 4.0 - 10.5 K/uL   RBC 3.57 (L) 3.87 - 5.11 Mil/uL   Hemoglobin 11.7 (L) 12.0 - 15.0 g/dL   HCT 33.4 (L) 36.0 - 46.0 %   MCV 93.4 78.0 - 100.0 fl   MCHC 35.0 30.0 - 36.0 g/dL   RDW 12.8 11.5 - 15.5 %   Platelets 298.0 150.0 - 400.0 K/uL   Neutrophils Relative % 60.8 43.0 - 77.0 %   Lymphocytes Relative 26.4 12.0 - 46.0 %   Monocytes Relative 9.0 3.0 - 12.0 %   Eosinophils Relative 3.4 0.0 - 5.0 %   Basophils Relative 0.4 0.0 - 3.0 %   Neutro Abs 4.5 1.4 - 7.7 K/uL   Lymphs Abs 1.9 0.7 - 4.0 K/uL   Monocytes Absolute 0.7 0.1 - 1.0 K/uL   Eosinophils Absolute 0.3 0.0 - 0.7 K/uL   Basophils Absolute 0.0 0.0 - 0.1 K/uL  Basic metabolic panel     Status: Abnormal   Collection Time: 09/25/17  4:47 PM  Result Value Ref Range   Sodium 138 135 - 145 mEq/L   Potassium 4.1 3.5 - 5.1 mEq/L   Chloride 100 96 - 112 mEq/L   CO2 29 19 - 32 mEq/L   Glucose,  Bld 297 (H) 70 - 99 mg/dL   BUN 21 6 - 23 mg/dL   Creatinine, Ser 0.99 0.40 - 1.20 mg/dL   Calcium 9.0 8.4 - 10.5 mg/dL   GFR 60.67 >60.00 mL/min  POCT HgB A1C     Status: Abnormal   Collection Time: 09/29/17  1:41 PM  Result Value Ref Range   Hemoglobin A1C 10.8 (A) 4.0 - 5.6 %   HbA1c, POC (prediabetic range)  5.7 - 6.4 %   HbA1c, POC (controlled diabetic range)  0.0 - 7.0 %    Assessment/Plan: 1. Swelling of left lower extremity Question hematoma as the area of swelling in rounded and about 4 cm in diameter. No hardness noted on palpation. Mild overlying erythema, very faint without induration. Suspect secondary to chronic edema. Will obtain US to further assess and r/o more concerning causes. Will alter regimen according to results.   - US Venous Img Lower Unilateral Left; Future - furosemide (LASIX) 20 MG tablet; Take 1 tablet (20 mg total) by mouth 2 (two) times daily.  Dispense: 60 tablet; Refill:  1   [name], [place]

## 2017-10-08 NOTE — Telephone Encounter (Signed)
Left message for pt to return call for lab results. See result note

## 2017-10-12 DIAGNOSIS — F3189 Other bipolar disorder: Secondary | ICD-10-CM | POA: Diagnosis not present

## 2017-10-12 DIAGNOSIS — E113511 Type 2 diabetes mellitus with proliferative diabetic retinopathy with macular edema, right eye: Secondary | ICD-10-CM | POA: Diagnosis not present

## 2017-10-16 DIAGNOSIS — H35373 Puckering of macula, bilateral: Secondary | ICD-10-CM | POA: Diagnosis not present

## 2017-10-16 DIAGNOSIS — E113512 Type 2 diabetes mellitus with proliferative diabetic retinopathy with macular edema, left eye: Secondary | ICD-10-CM | POA: Diagnosis not present

## 2017-10-16 DIAGNOSIS — Z961 Presence of intraocular lens: Secondary | ICD-10-CM | POA: Diagnosis not present

## 2017-10-16 DIAGNOSIS — Z79899 Other long term (current) drug therapy: Secondary | ICD-10-CM | POA: Diagnosis not present

## 2017-10-16 DIAGNOSIS — H3582 Retinal ischemia: Secondary | ICD-10-CM | POA: Diagnosis not present

## 2017-10-16 DIAGNOSIS — H31093 Other chorioretinal scars, bilateral: Secondary | ICD-10-CM | POA: Diagnosis not present

## 2017-10-16 DIAGNOSIS — H33191 Other retinoschisis and retinal cysts, right eye: Secondary | ICD-10-CM | POA: Diagnosis not present

## 2017-10-16 DIAGNOSIS — E113511 Type 2 diabetes mellitus with proliferative diabetic retinopathy with macular edema, right eye: Secondary | ICD-10-CM | POA: Diagnosis not present

## 2017-10-20 ENCOUNTER — Other Ambulatory Visit (INDEPENDENT_AMBULATORY_CARE_PROVIDER_SITE_OTHER): Payer: PPO

## 2017-10-20 ENCOUNTER — Other Ambulatory Visit: Payer: Self-pay | Admitting: Physician Assistant

## 2017-10-20 DIAGNOSIS — F319 Bipolar disorder, unspecified: Secondary | ICD-10-CM | POA: Diagnosis not present

## 2017-10-20 DIAGNOSIS — F3189 Other bipolar disorder: Secondary | ICD-10-CM | POA: Diagnosis not present

## 2017-10-20 NOTE — Telephone Encounter (Signed)
Spoke with Psychiatrist who recently changed patient's lithium from capsules to tablets to see if this helps GI tolerance of medication. Had discussed with him previously of patient's potassium abnormalities and her mention of severe nausea and vomiting with lithium but specialist previously felt the two were not related. States she has done better with GI symptoms since change. He noted some mild myoclonic activity on her recent appointment so he held her Lexapro and has reduced the level or Lithium even though levels were within therapeutic range. Has follow-up with her Friday but was having her get labs redrawn to reassess levels. Notes if not improving at follow-up in a few days he will be sending her for further assessment. She has also been instructed to go to ER if symptoms are not resolving with holding and decreasing medications.   Front desk made aware that specialist told her to come here for labs even though he is not in our system. I want to speak and evaluate patient when she comes in due to these new symptoms.

## 2017-10-20 NOTE — Telephone Encounter (Signed)
Copied from [place] [id number]. Topic: Inquiry >> Oct 20, 2017  1:03 PM Pricilla Handler wrote: Reason for CRM: Doctor Orgjoco [phone]) called requesting to speak with Elyn Aquas concerning this patient. Please call Dr. Lauree Chandler at [phone].      Thank You!!!

## 2017-10-21 DIAGNOSIS — E113512 Type 2 diabetes mellitus with proliferative diabetic retinopathy with macular edema, left eye: Secondary | ICD-10-CM | POA: Diagnosis not present

## 2017-10-21 LAB — RENAL FUNCTION PANEL
ALBUMIN MSPROF: 4 g/dL (ref 3.6–5.1)
BUN/Creatinine Ratio: 21 (calc) (ref 6–22)
BUN: 29 mg/dL — ABNORMAL HIGH (ref 7–25)
CO2: 32 mmol/L (ref 20–32)
Calcium: 9.7 mg/dL (ref 8.6–10.4)
Chloride: 89 mmol/L — ABNORMAL LOW (ref 98–110)
Creat: 1.4 mg/dL — ABNORMAL HIGH (ref 0.50–0.99)
GLUCOSE: 423 mg/dL — AB (ref 65–99)
PHOSPHORUS: 4.7 mg/dL — AB (ref 2.5–4.5)
Potassium: 5 mmol/L (ref 3.5–5.3)
SODIUM: 133 mmol/L — AB (ref 135–146)

## 2017-10-21 LAB — LITHIUM LEVEL: Lithium Lvl: 0.6 mmol/L (ref 0.6–1.2)

## 2017-10-21 LAB — TSH: TSH: 1.33 mIU/L (ref 0.40–4.50)

## 2017-10-22 ENCOUNTER — Telehealth: Payer: Self-pay | Admitting: Physician Assistant

## 2017-10-22 ENCOUNTER — Other Ambulatory Visit: Payer: Self-pay | Admitting: Physician Assistant

## 2017-10-22 DIAGNOSIS — R7309 Other abnormal glucose: Secondary | ICD-10-CM

## 2017-10-22 NOTE — Telephone Encounter (Addendum)
  Pt returned call, states that she has some jitters, states that her gait is off. Pt is unable to check her sugars at this time. She has been having ongoing highs ans lows. Pt will check sugars tonight and tomorrow morning and call the office.

## 2017-10-22 NOTE — Telephone Encounter (Signed)
Dana [name] with [employer] called with critical Glucose 423 on lab work collected on 10/20/17, she says she faxed over the results to the office.

## 2017-10-22 NOTE — Telephone Encounter (Signed)
Please call patient. Check on how she is feeling and what her blood sugar is now.   She should call her endocrinologist office if sugars are staying above 400 and she is feeling ok. If she is having higher blood sugars or is having shortness of breath, she needs to be evaluated.

## 2017-10-22 NOTE — Telephone Encounter (Signed)
Please check to see if she is using the freestyle libre sensor for monitoring and what the levels are.  Also is she using the V-go pump that was given to her.  Otherwise need to know exactly how much insulin she is taking

## 2017-10-22 NOTE — Telephone Encounter (Signed)
Called pt and left message for pt to call back.

## 2017-10-22 NOTE — Telephone Encounter (Signed)
Called pt and left a detailed message to return call to let us know how sugars are, and of Dr. Tamela Oddi recommendations.   I also routed message to Dr. Dwyane Dee so his office is aware of the lab value and to [facility] so he is aware of situation.

## 2017-10-23 ENCOUNTER — Telehealth: Payer: Self-pay | Admitting: Physician Assistant

## 2017-10-23 ENCOUNTER — Telehealth: Payer: Self-pay | Admitting: Endocrinology

## 2017-10-23 DIAGNOSIS — F3189 Other bipolar disorder: Secondary | ICD-10-CM | POA: Diagnosis not present

## 2017-10-23 NOTE — Telephone Encounter (Signed)
Copied from [place] [id number]. Topic: General - Other >> Oct 23, 2017  4:20 PM Margot Ables wrote: Reason for CRM: pt is doing better without lithium and lexapro. He may have to start her on something else when he sees her next week.

## 2017-10-23 NOTE — Telephone Encounter (Signed)
Noted  

## 2017-10-23 NOTE — Telephone Encounter (Signed)
Fyi.

## 2017-10-23 NOTE — Telephone Encounter (Signed)
Patient states she missed a call concerning her b/s please advise

## 2017-10-26 ENCOUNTER — Other Ambulatory Visit (INDEPENDENT_AMBULATORY_CARE_PROVIDER_SITE_OTHER): Payer: PPO

## 2017-10-26 DIAGNOSIS — R7309 Other abnormal glucose: Secondary | ICD-10-CM | POA: Diagnosis not present

## 2017-10-26 LAB — BASIC METABOLIC PANEL
BUN: 23 mg/dL (ref 6–23)
CHLORIDE: 99 meq/L (ref 96–112)
CO2: 30 mEq/L (ref 19–32)
CREATININE: 1.18 mg/dL (ref 0.40–1.20)
Calcium: 10 mg/dL (ref 8.4–10.5)
GFR: 49.53 mL/min — ABNORMAL LOW (ref >60.00)
GLUCOSE: 256 mg/dL — AB (ref 70–99)
Potassium: 5.1 mEq/L (ref 3.5–5.1)
Sodium: 139 mEq/L (ref 135–145)

## 2017-10-27 DIAGNOSIS — L97522 Non-pressure chronic ulcer of other part of left foot with fat layer exposed: Secondary | ICD-10-CM | POA: Diagnosis not present

## 2017-10-27 DIAGNOSIS — E11621 Type 2 diabetes mellitus with foot ulcer: Secondary | ICD-10-CM | POA: Diagnosis not present

## 2017-10-27 DIAGNOSIS — Z794 Long term (current) use of insulin: Secondary | ICD-10-CM | POA: Diagnosis not present

## 2017-10-28 DIAGNOSIS — F3189 Other bipolar disorder: Secondary | ICD-10-CM | POA: Diagnosis not present

## 2017-10-30 ENCOUNTER — Encounter: Payer: Self-pay | Admitting: Physician Assistant

## 2017-10-30 ENCOUNTER — Ambulatory Visit (INDEPENDENT_AMBULATORY_CARE_PROVIDER_SITE_OTHER): Payer: PPO | Admitting: Physician Assistant

## 2017-10-30 ENCOUNTER — Other Ambulatory Visit: Payer: Self-pay

## 2017-10-30 VITALS — BP 112/72 | Temp 97.8°F | Resp 14 | Ht 60.0 in | Wt 186.0 lb

## 2017-10-30 DIAGNOSIS — S80812A Abrasion, left lower leg, initial encounter: Secondary | ICD-10-CM

## 2017-10-30 NOTE — Progress Notes (Signed)
Patient presents to clinic today c/o scrape of left anterior left leg.  Notes some tenderness to the area without drainage, numbness or tingling. Patient denies fever, chills, malaise or fatigue. Has DM II with history of chronic wound and R BKA, L forefoot amputation. Is followed by wound care on a continual basis.  Past Medical History:  Diagnosis Date  . Bipolar 1 disorder ([place])   . Cellulitis and abscess of foot 03/08/2015  . Diabetes mellitus    INSULIN DEPENDENT  . Diabetic neuropathy ([place])   . Diabetic neuropathy ([place])   . 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 H/O hiatal hernia   . Hyperlipidemia   . Hypertension    past hx of  . Iron malabsorption 09/26/2015  . Numbness and tingling    Hx; of in B/LLE and B/LUE  . Other iron deficiency anemias 09/20/2015    Current Outpatient Medications on File Prior to Visit  Medication Sig Dispense Refill  . aspirin 325 MG EC tablet Take 325 mg by mouth daily.    Marland Kitchen atorvastatin (LIPITOR) 20 MG tablet Take 1 tablet (20 mg total) by mouth at bedtime. 90 tablet 1  . Blood Glucose Monitoring Suppl (FREESTYLE LITE) DEVI Use to check blood sugar 3 times a day dx code E11.39 1 each 0  . Continuous Blood Gluc Sensor (FREESTYLE LIBRE 14 DAY SENSOR) MISC 1 Units by Does not apply route every 14 (fourteen) days. 2 each 4  . FREESTYLE LITE test strip USE AS DIRECTED TO CHECK BLOOD SUGAR THREE TIMES DAILY 100 each 3  . furosemide (LASIX) 20 MG tablet Take 1 tablet (20 mg total) by mouth 2 (two) times daily. 60 tablet 1  . gabapentin (NEURONTIN) 600 MG tablet TAKE 1200 mg  BY MOUTH THREE TIMES DAILY 540 tablet 0  . insulin regular (NOVOLIN R RELION) 100 units/mL injection Take 6 units with breakfast, 8 units with lunch and 8 units with dinner (Patient taking differently: Inject 4-10 Units into the skin 3 (three) times daily before meals. Per meal size, small meals = less pumps to Vgo and large meal = more pumps to Vgo. 1 pump = 2 units.) 10 mL 3    . INSULIN SYRINGE 1CC/29G 29G X 1/2" 1 ML MISC Use 1 per day. 100 each 2  . Lancets (FREESTYLE) lancets Use as instructed to check blood sugar 3 times per day dx code E11.39 100 each 3  . lurasidone (LATUDA) 20 MG TABS tablet Take 20 mg by mouth. Taking 0.5 tablet and titration up to 1 tablet    . metFORMIN (GLUCOPHAGE-XR) 500 MG 24 hr tablet Take 4 tablets (2,000 mg total) by mouth daily with supper. (Patient taking differently: Take 2,000 mg by mouth daily with supper. ) 120 tablet 3  . OVER THE COUNTER MEDICATION Take 2 each by mouth daily. Centrum-Muli. Vitamin Gummy    . pantoprazole (PROTONIX) 40 MG tablet TAKE 1 TABLET BY MOUTH ONCE DAILY 90 tablet 1  . fluticasone (FLOVENT HFA) 110 MCG/ACT inhaler Inhale 1 puff into the lungs 2 (two) times daily. (Patient not taking: Reported on 10/30/2017) 1 Inhaler 0  . Insulin Disposable Pump (V-GO 30) KIT USE AS DIRECTED (Patient not taking: Reported on 09/29/2017) 30 kit 2   No current facility-administered medications on file prior to visit.     Allergies  Allergen Reactions  . Keflex [Cephalexin]     Tremors, rash, hard to breathe  . Lithium Nausea And Vomiting and Other (See Comments)  Other reaction(s): Other (See Comments) Can not keep this medication down. It makes her terribly ill. Can not keep this medication down. It makes her terribly ill.  . Ativan [Lorazepam] Other (See Comments)    Other reaction(s): ANAPHYLAXIS Other reaction(s): Other (See Comments) Abnormal behavior Abnormal behavior  . Demerol [Meperidine] Other (See Comments)    Abnormal behavior (sees things) Other reaction(s): ANAPHYLAXIS Other reaction(s): Other (See Comments) Abnormal behavior Abnormal behavior  . Oxycodone Other (See Comments)    Other reaction(s): OTHER Abnormal behavior  . Codeine Itching and Other (See Comments)    hallucinations  . Doxycycline Other (See Comments)    Severe muscle tremor  . Darvocet [Propoxyphene N-Acetaminophen] Itching   . Latex Itching    Other reaction(s): OTHER  . Propoxyphene Itching    Family History  Problem Relation Age of Onset  . Diabetes Mother   . Mental illness Mother   . Bipolar disorder Mother   . Hypertension Father   . Diabetes Maternal Grandmother   . Heart disease Neg Hx     Social History   Socioeconomic History  . Marital status: Married    Spouse name: Not on file  . Number of children: 2  . Years of education: 58  . Highest education level: Not on file  Occupational History    Comment: disabled  Social Needs  . Financial resource strain: Not on file  . Food insecurity:    Worry: Not on file    Inability: Not on file  . Transportation needs:    Medical: Not on file    Non-medical: Not on file  Tobacco Use  . Smoking status: Never Smoker  . Smokeless tobacco: Never Used  Substance and Sexual Activity  . Alcohol use: No    Alcohol/week: 0.0 oz  . Drug use: No  . Sexual activity: Never    Partners: Male  Lifestyle  . Physical activity:    Days per week: Not on file    Minutes per session: Not on file  . Stress: Not on file  Relationships  . Social connections:    Talks on phone: Not on file    Gets together: Not on file    Attends religious service: Not on file    Active member of club or organization: Not on file    Attends meetings of clubs or organizations: Not on file    Relationship status: Not on file  Other Topics Concern  . Not on file  Social History Narrative   Lives with husband in home   Caffeine use - Coke 2 or 3 16 oz daily   Review of Systems - See HPI.  All other ROS are negative.  BP 112/72   Temp 97.8 F (36.6 C) (Oral)   Resp 14   Ht 5' (1.524 m)   Wt 186 lb (84.4 kg)   BMI 36.33 kg/m   Physical Exam  Constitutional: She appears well-developed and well-nourished.  HENT:  Head: Normocephalic and atraumatic.  Neck: Neck supple.  Cardiovascular: Normal rate, regular rhythm and normal heart sounds.  Pulmonary/Chest:  Effort normal.  Skin:     Vitals reviewed.  Recent Results (from the past 2160 hour(s))  Basic metabolic panel     Status: Abnormal   Collection Time: 08/05/17 10:14 AM  Result Value Ref Range   Sodium 137 135 - 145 mEq/L   Potassium 5.5 (H) 3.5 - 5.1 mEq/L   Chloride 98 96 - 112 mEq/L   CO2 31  19 - 32 mEq/L   Glucose, Bld 376 (H) 70 - 99 mg/dL   BUN 28 (H) 6 - 23 mg/dL   Creatinine, Ser 1.06 0.40 - 1.20 mg/dL   Calcium 9.7 8.4 - 10.5 mg/dL   GFR 56.10 (L) >60.00 mL/min  TSH     Status: None   Collection Time: 09/25/17  2:25 PM  Result Value Ref Range   TSH 1.02 0.35 - 4.50 uIU/mL  CBC w/Diff     Status: Abnormal   Collection Time: 09/25/17  2:25 PM  Result Value Ref Range   WBC 7.3 4.0 - 10.5 K/uL   RBC 3.57 (L) 3.87 - 5.11 Mil/uL   Hemoglobin 11.7 (L) 12.0 - 15.0 g/dL   HCT 33.4 (L) 36.0 - 46.0 %   MCV 93.4 78.0 - 100.0 fl   MCHC 35.0 30.0 - 36.0 g/dL   RDW 12.8 11.5 - 15.5 %   Platelets 298.0 150.0 - 400.0 K/uL   Neutrophils Relative % 60.8 43.0 - 77.0 %   Lymphocytes Relative 26.4 12.0 - 46.0 %   Monocytes Relative 9.0 3.0 - 12.0 %   Eosinophils Relative 3.4 0.0 - 5.0 %   Basophils Relative 0.4 0.0 - 3.0 %   Neutro Abs 4.5 1.4 - 7.7 K/uL   Lymphs Abs 1.9 0.7 - 4.0 K/uL   Monocytes Absolute 0.7 0.1 - 1.0 K/uL   Eosinophils Absolute 0.3 0.0 - 0.7 K/uL   Basophils Absolute 0.0 0.0 - 0.1 K/uL  Basic metabolic panel     Status: Abnormal   Collection Time: 09/25/17  4:47 PM  Result Value Ref Range   Sodium 138 135 - 145 mEq/L   Potassium 4.1 3.5 - 5.1 mEq/L   Chloride 100 96 - 112 mEq/L   CO2 29 19 - 32 mEq/L   Glucose, Bld 297 (H) 70 - 99 mg/dL   BUN 21 6 - 23 mg/dL   Creatinine, Ser 0.99 0.40 - 1.20 mg/dL   Calcium 9.0 8.4 - 10.5 mg/dL   GFR 60.67 >60.00 mL/min  POCT HgB A1C     Status: Abnormal   Collection Time: 09/29/17  1:41 PM  Result Value Ref Range   Hemoglobin A1C 10.8 (A) 4.0 - 5.6 %   HbA1c, POC (prediabetic range)  5.7 - 6.4 %   HbA1c, POC  (controlled diabetic range)  0.0 - 7.0 %  Lithium level     Status: None   Collection Time: 10/20/17  3:51 PM  Result Value Ref Range   Lithium Lvl 0.6 0.6 - 1.2 mmol/L  TSH     Status: None   Collection Time: 10/20/17  3:51 PM  Result Value Ref Range   TSH 1.33 0.40 - 4.50 mIU/L  Renal Function Panel     Status: Abnormal   Collection Time: 10/20/17  3:51 PM  Result Value Ref Range   Glucose, Bld 423 (H) 65 - 99 mg/dL    Comment: Verified by repeat analysis. Marland Kitchen .            Fasting reference interval . For someone without known diabetes, a glucose value >125 mg/dL indicates that they may have diabetes and this should be confirmed with a follow-up test. .    BUN 29 (H) 7 - 25 mg/dL   Creat 1.40 (H) 0.50 - 0.99 mg/dL    Comment: For patients >71 years of age, the reference limit for Creatinine is approximately 13% higher for people identified as African-American. Marland Kitchen  BUN/Creatinine Ratio 21 6 - 22 (calc)   Sodium 133 (L) 135 - 146 mmol/L   Potassium 5.0 3.5 - 5.3 mmol/L   Chloride 89 (L) 98 - 110 mmol/L   CO2 32 20 - 32 mmol/L   Calcium 9.7 8.6 - 10.4 mg/dL   Phosphorus 4.7 (H) 2.5 - 4.5 mg/dL   Albumin 4.0 3.6 - 5.1 g/dL  Basic metabolic panel     Status: Abnormal   Collection Time: 10/26/17  9:44 AM  Result Value Ref Range   Sodium 139 135 - 145 mEq/L   Potassium 5.1 3.5 - 5.1 mEq/L   Chloride 99 96 - 112 mEq/L   CO2 30 19 - 32 mEq/L   Glucose, Bld 256 (H) 70 - 99 mg/dL   BUN 23 6 - 23 mg/dL   Creatinine, Ser 1.18 0.40 - 1.20 mg/dL   Calcium 10.0 8.4 - 10.5 mg/dL   GFR 49.53 (L) >60.00 mL/min   Assessment/Plan: 1. Abrasion of left lower extremity, initial encounter No sign of infection on exam. Reviewed proper cleaning techniques. Symptoms of infection reviewed with patient. Continue follow-up with wound care. Call for any increased pain, redness, warmth or if any drainage is noted.   [name], PA-C

## 2017-10-30 NOTE — Patient Instructions (Addendum)
Please keep well-hydrated and get plenty of rest.  I am going to send a message to Dr. Dwyane Dee for direction on further adjusting your medications.   Please keep the scrape on the knee clean and dry.  Apply a thin layer of Neosporin to the area to help prevent infection and to keep the wound from getting too dry.

## 2017-11-03 ENCOUNTER — Ambulatory Visit: Payer: Self-pay

## 2017-11-03 ENCOUNTER — Telehealth: Payer: Self-pay | Admitting: Physician Assistant

## 2017-11-03 DIAGNOSIS — M79641 Pain in right hand: Secondary | ICD-10-CM

## 2017-11-03 DIAGNOSIS — M79642 Pain in left hand: Principal | ICD-10-CM

## 2017-11-03 NOTE — Telephone Encounter (Signed)
Patient called, left VM to return call to the office to discuss symptoms of right hand pain.

## 2017-11-03 NOTE — Telephone Encounter (Signed)
Copied from [place] [id number]. Topic: Quick Communication - See Telephone Encounter >> Nov 03, 2017  5:16 PM Neva Seat wrote: Pt needing a referral to a hand specialist due the continued issues she is having with her hands.

## 2017-11-04 DIAGNOSIS — F3189 Other bipolar disorder: Secondary | ICD-10-CM | POA: Diagnosis not present

## 2017-11-04 NOTE — Telephone Encounter (Signed)
Referral placed.

## 2017-11-04 NOTE — Telephone Encounter (Signed)
Routing to PCP to advise. Need to see  if he is aware of issues.

## 2017-11-08 IMAGING — DX DG CHEST 2V
2 series · 2 of 2 positions shown · non-contrast
Comparison: 06/15/2015 chest radiograph.

CLINICAL DATA: Fever

EXAM:
CHEST  2 VIEW

[chest pa]
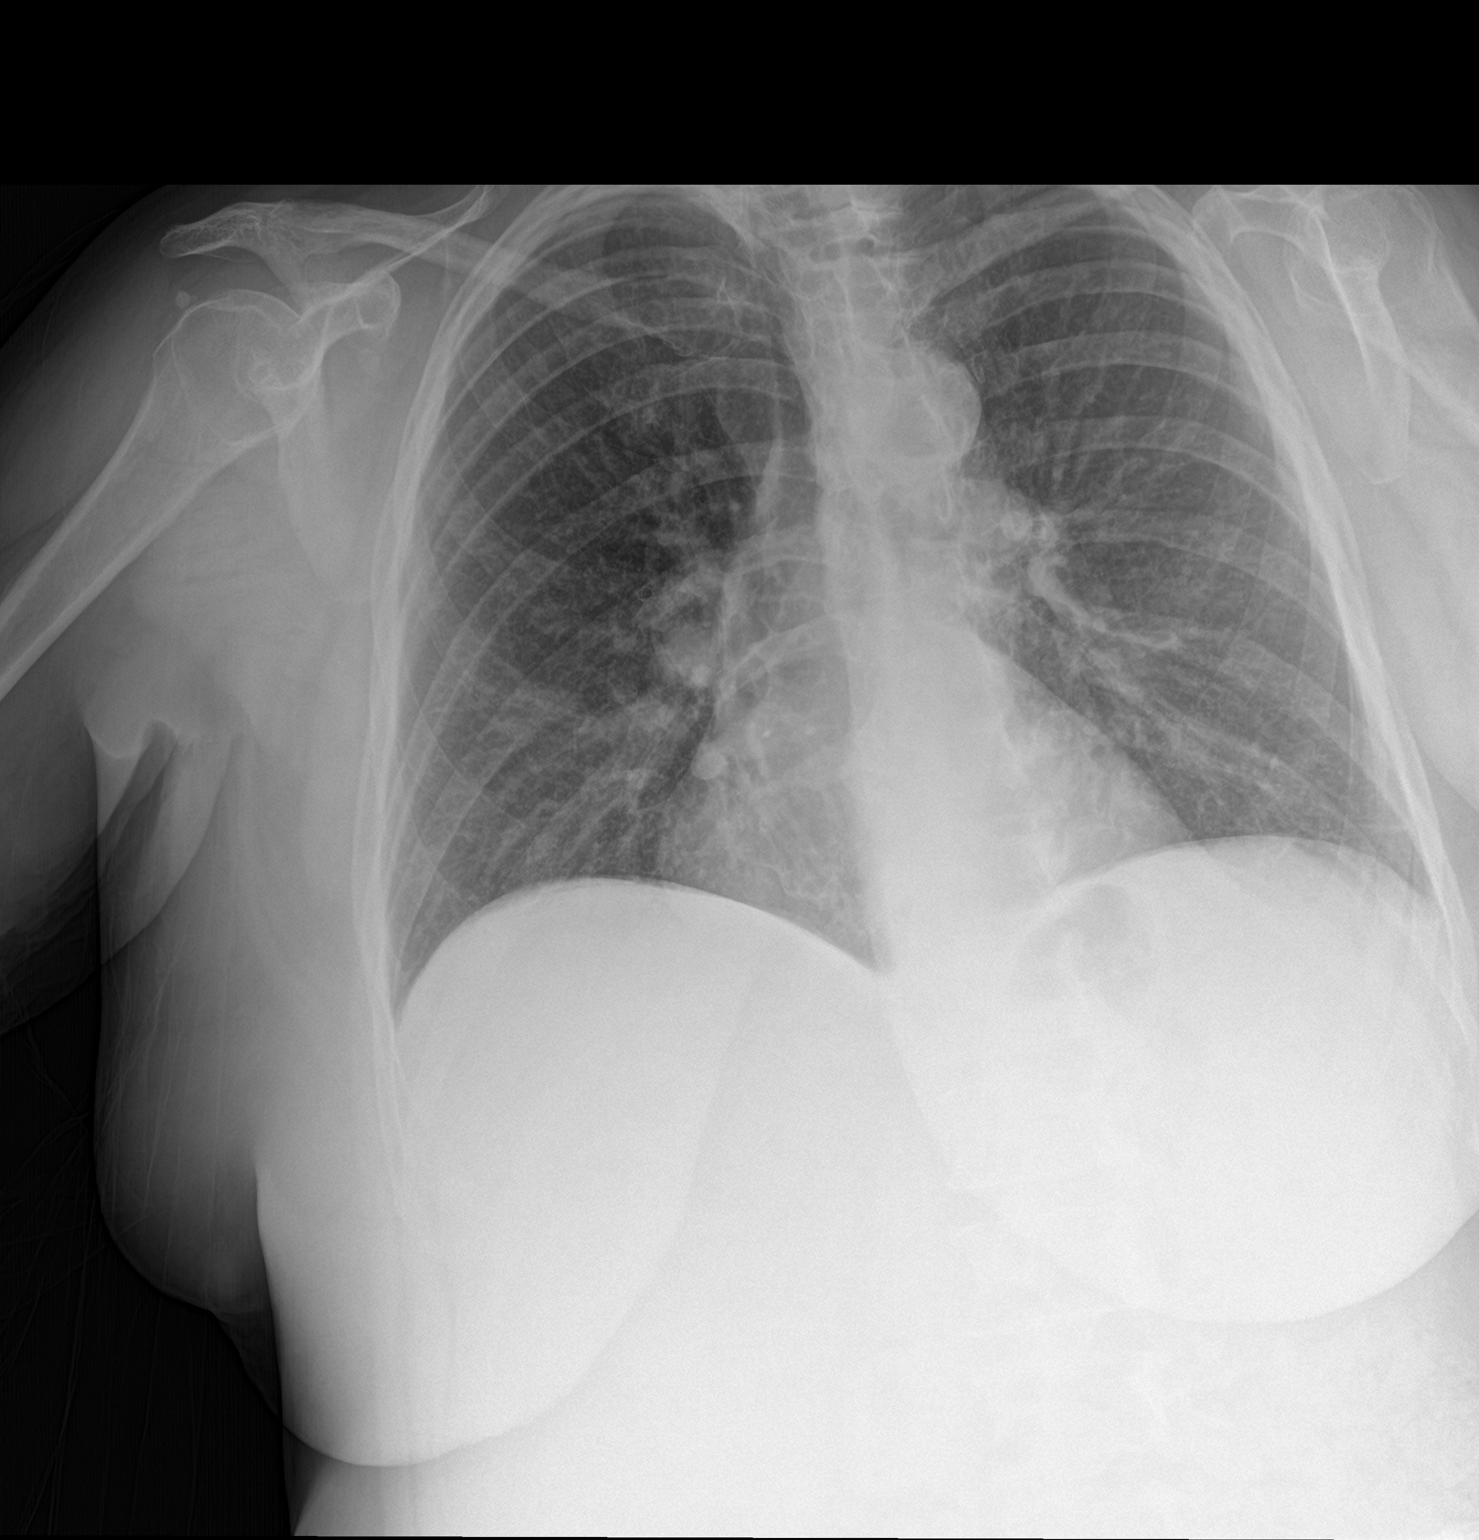

[chest lat]
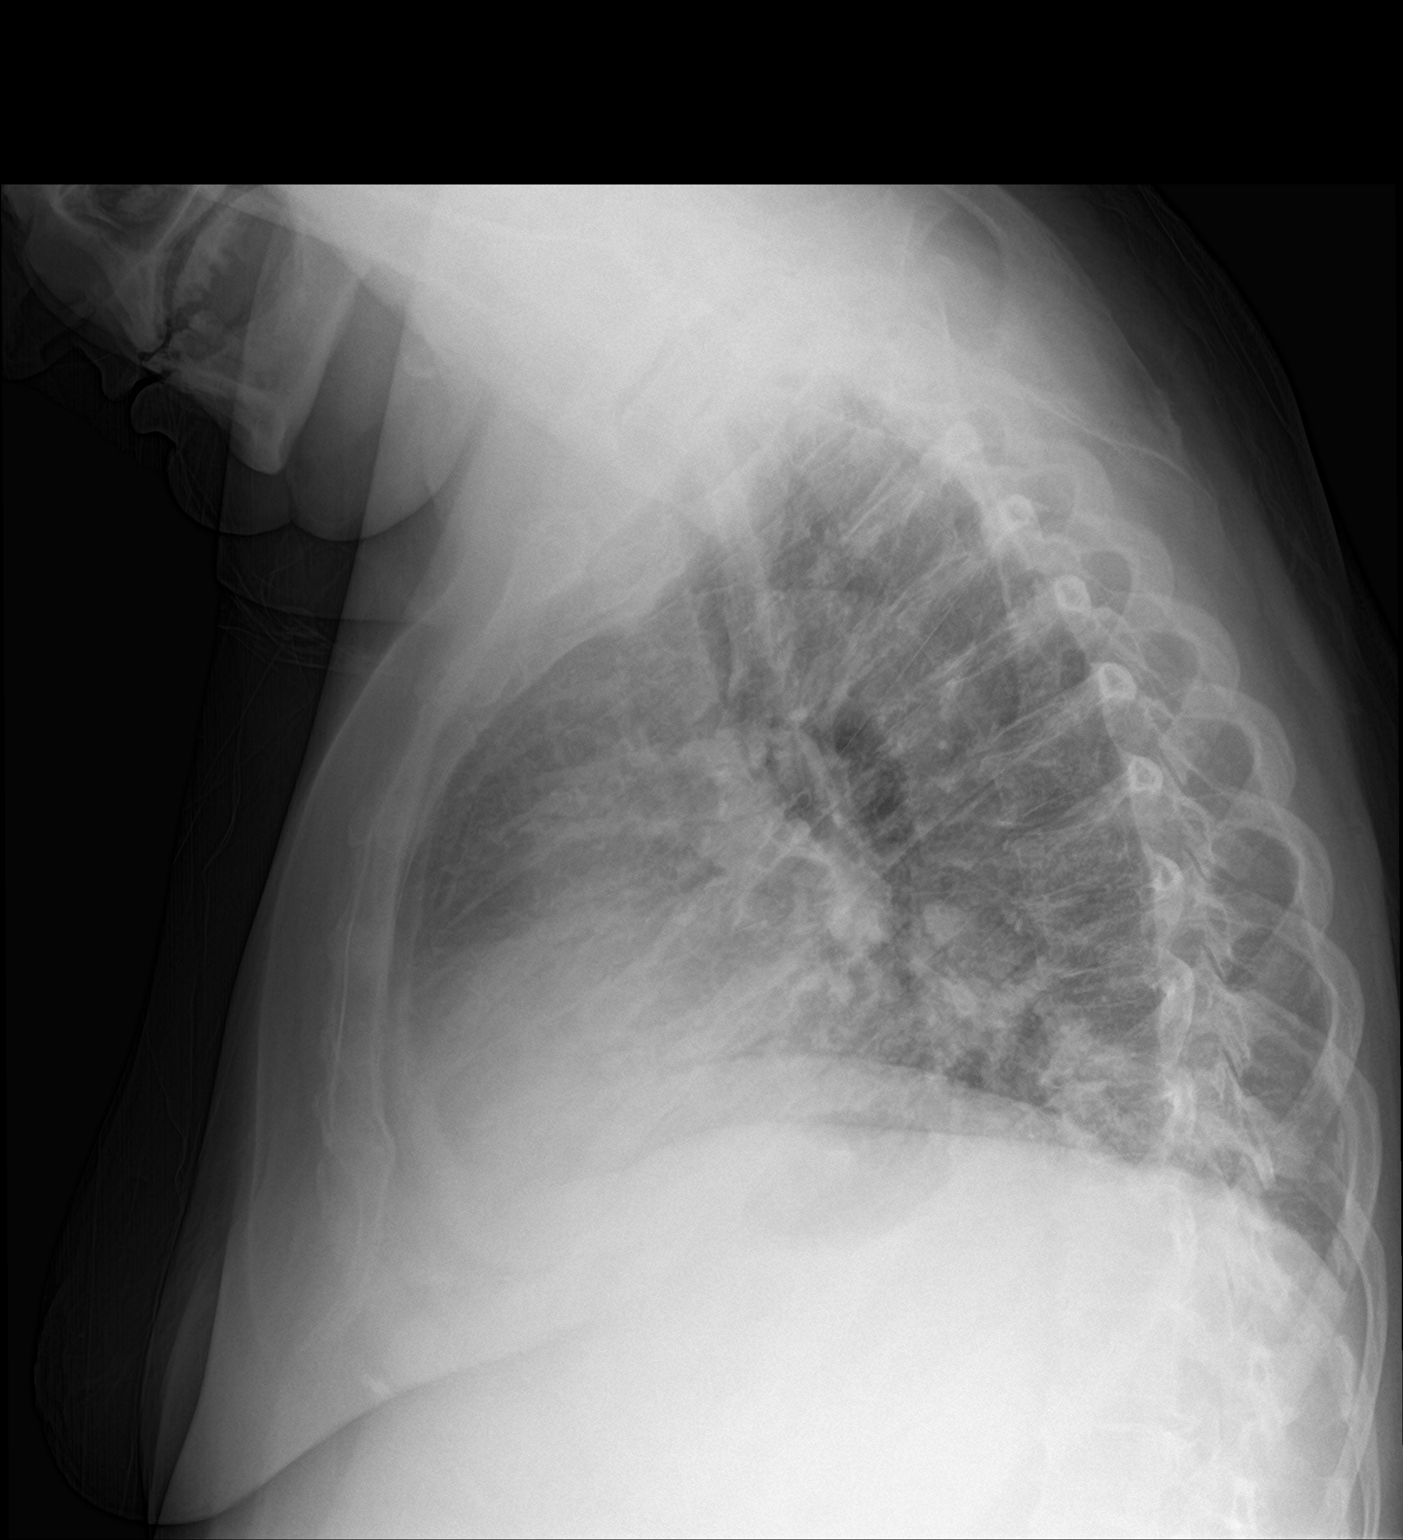

[2 of 2 positions shown; findings below may reference images not displayed]

FINDINGS: Stable cardiomediastinal silhouette with top-normal heart size and
aortic atherosclerosis. No pneumothorax. No pleural effusion. Right
rotated frontal view. Hypo inspiratory lateral view. No pulmonary
edema. Bibasilar lung opacities on the lateral view are favored to
represent atelectasis given the absence of opacities on the better
inflated frontal view.
IMPRESSION: Bibasilar atelectasis.  Otherwise no active disease in the chest.

Aortic atherosclerosis.

## 2017-11-08 NOTE — Progress Notes (Signed)
Patient ID: Dana [name], female   DOB: 06-Nov-1955, 62 y.o.   MRN: [id number]           Reason for Appointment: Follow-up for Type 2 Diabetes  Referring physician: Elyn Aquas  History of Present Illness:          Date of diagnosis of type 2 diabetes mellitus: 2006       Background history:   Dana [name] thinks Dana [name] blood sugar was 300-400 at the time of diagnosis and Dana [name] was started on insulin soon after this. Dana [name] thinks Dana [name] has been on metformin only for the last 5 years Dana [name] A1c history is available since only about 2013 and this had been consistently over 10% Dana [name]  had  poor control of Dana [name] diabetes with pre-consultation A1c of 13%, was on Levemir only  Although Dana [name] did better with the V-go pump 30 in 2016 Dana [name] stopped this because of increased cost after a short time  Recent history:    Dana [name] A1c is last 10.8, previously 7.2, has been as low as 6.7,  INSULIN regimen is: Regular insulin 6-10 units before meals  , 35 hs  times a day, previously using V-go pump, 30 unit basal, boluses 6 units at breakfast, 8 at lunch and 10-12 at dinner  Current blood sugar patterns, daily management and problems identified:  Dana [name] still has not been using the freestyle Libre sensor because it is not sticking well despite using skin Tac  Even though Dana [name] was started on a trial of the 40 unit V-go pump with the samples last time Dana [name] says it did not work after a couple of days and Dana [name] did not ask for a new prescription or get Dana [name] current prescription refilled for the 30 unit pump  However Dana [name] thinks that after starting the V-go pump Dana [name] morning sugar came down to 94 and Dana [name] felt a little shaky at that time  Dana [name] blood sugars are still markedly increased  Dana [name] is trying to take regular insulin only but is taking as much of 35 units at bedtime on Dana [name] own  Although Dana [name] blood sugars are just over 200 on an average in the morning they are markedly higher the rest of the day when Dana [name] checks them with Dana [name] freestyle  meter  Dana [name] did not let us know that Dana [name] blood sugars were persistently high  Dana [name] still has difficulty avoiding regular soft drinks completely                          Non-insulin hypoglycemic drugs the patient is taking are: Metformin 1g bid  Side effects from medications have been: ? diarrhea from metformin   Compliance with the medical regimen: Good Hypoglycemia: As above    Glucose monitoring:  done 2-3 times a day         Glucometer:  FreeStyle libre and Freestyle lite .      Blood Glucose readings show that Dana [name] average for the last month is 355  Not clear which readings are recent, has a range the last few days of 66-215  Self-care:  Typical meal intake: Breakfast is irregular otherwise may have toast and an egg or sausage.  Lunch is a sandwich or cheeseburger, evening meal is meat and 2 vegetables.  For snacks  will have peanut butter crackers in pms Dinner 5-7 pm                Dietician visit, most recent: Never  Exercise: none  Weight history: Stable over the last 3-4 years, was 400 pounds about 5 years ago  Wt Readings from Last 3 Encounters:  11/09/17 183 lb 9.6 oz (83.3 kg)  10/30/17 186 lb (84.4 kg)  10/07/17 185 lb 8 oz (84.1 kg)    Glycemic control:   Lab Results  Component Value Date   HGBA1C 10.8 (A) 09/29/2017   HGBA1C 7.2 07/03/2017   HGBA1C 7.4 03/04/2017   Lab Results  Component Value Date   MICROALBUR 0.8 10/01/2016   LDLCALC 77 10/01/2016   CREATININE 1.18 10/26/2017       Allergies as of 11/09/2017      Reactions   Keflex [cephalexin]    Tremors, rash, hard to breathe   Lithium Nausea And Vomiting, Other (See Comments)   Other reaction(s): Other (See Comments) Can not keep this medication down. It makes Dana [name] terribly ill. Can not keep this medication down. It makes Dana [name] terribly ill.   Ativan [lorazepam] Other (See Comments)   Other reaction(s): ANAPHYLAXIS Other reaction(s): Other (See Comments) Abnormal  behavior Abnormal behavior   Demerol [meperidine] Other (See Comments)   Abnormal behavior (sees things) Other reaction(s): ANAPHYLAXIS Other reaction(s): Other (See Comments) Abnormal behavior Abnormal behavior   Oxycodone Other (See Comments)   Other reaction(s): OTHER Abnormal behavior   Codeine Itching, Other (See Comments)   hallucinations   Doxycycline Other (See Comments)   Severe muscle tremor   Darvocet [propoxyphene N-acetaminophen] Itching   Latex Itching   Other reaction(s): OTHER   Propoxyphene Itching      Medication List        Accurate as of 11/09/17 12:01 PM. Always use your most recent med list.          aspirin 325 MG EC tablet Take 325 mg by mouth daily.   atorvastatin 20 MG tablet Commonly known as:  LIPITOR Take 1 tablet (20 mg total) by mouth at bedtime.   fluticasone 110 MCG/ACT inhaler Commonly known as:  FLOVENT HFA Inhale 1 puff into the lungs 2 (two) times daily.   freestyle lancets Use as instructed to check blood sugar 3 times per day dx code E11.39   FREESTYLE LIBRE 14 DAY SENSOR Misc 1 Units by Does not apply route every 14 (fourteen) days.   FREESTYLE LITE Devi Use to check blood sugar 3 times a day dx code E11.39   FREESTYLE LITE test strip Generic drug:  glucose blood USE AS DIRECTED TO CHECK BLOOD SUGAR THREE TIMES DAILY   furosemide 20 MG tablet Commonly known as:  LASIX Take 1 tablet (20 mg total) by mouth 2 (two) times daily.   gabapentin 600 MG tablet Commonly known as:  NEURONTIN TAKE 1200 mg  BY MOUTH THREE TIMES DAILY   insulin regular 100 units/mL injection Commonly known as:  NOVOLIN R,HUMULIN R Inject into the skin 3 (three) times daily before meals. TAKE 6 UNITS UNDER THE SKIN IN THE MORNING WITH BREAKFAST, 8 AT LUNCH, 8 AT DINNER, AND 35 AT NIGHT.   INSULIN SYRINGE 1CC/29G 29G X 1/2" 1 ML Misc Use 1 per day.   LATUDA 20 MG Tabs tablet Generic drug:  lurasidone Take 20 mg by mouth. Taking 0.5 tablet and  titration up to 1 tablet   metFORMIN 500 MG 24 hr tablet Commonly known as:  GLUCOPHAGE-XR Take 4 tablets (2,000 mg total) by mouth daily with supper.   OVER THE COUNTER MEDICATION Take 2 each by mouth daily. Centrum-Muli. Vitamin Gummy  pantoprazole 40 MG tablet Commonly known as:  PROTONIX TAKE 1 TABLET BY MOUTH ONCE DAILY   V-GO 30 Kit USE AS DIRECTED       Allergies:  Allergies  Allergen Reactions  . Keflex [Cephalexin]     Tremors, rash, hard to breathe  . Lithium Nausea And Vomiting and Other (See Comments)    Other reaction(s): Other (See Comments) Can not keep this medication down. It makes Dana [name] terribly ill. Can not keep this medication down. It makes Dana [name] terribly ill.  . Ativan [Lorazepam] Other (See Comments)    Other reaction(s): ANAPHYLAXIS Other reaction(s): Other (See Comments) Abnormal behavior Abnormal behavior  . Demerol [Meperidine] Other (See Comments)    Abnormal behavior (sees things) Other reaction(s): ANAPHYLAXIS Other reaction(s): Other (See Comments) Abnormal behavior Abnormal behavior  . Oxycodone Other (See Comments)    Other reaction(s): OTHER Abnormal behavior  . Codeine Itching and Other (See Comments)    hallucinations  . Doxycycline Other (See Comments)    Severe muscle tremor  . Darvocet [Propoxyphene N-Acetaminophen] Itching  . Latex Itching    Other reaction(s): OTHER  . Propoxyphene Itching    Past Medical History:  Diagnosis Date  . Bipolar 1 disorder ([place])   . Cellulitis and abscess of foot 03/08/2015  . Diabetes mellitus    INSULIN DEPENDENT  . Diabetic neuropathy ([place])   . Diabetic neuropathy ([place])   . 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 H/O hiatal hernia   . Hyperlipidemia   . Hypertension    past hx of  . Iron malabsorption 09/26/2015  . Numbness and tingling    Hx; of in B/LLE and B/LUE  . Other iron deficiency anemias 09/20/2015    Past Surgical History:  Procedure Laterality Date  .  ABDOMINAL HYSTERECTOMY    . ADENOIDECTOMY     Hx: of  . AMPUTATION  10/24/2011   Procedure: AMPUTATION RAY;  Surgeon: Newt Minion, MD;  Location: [facility];  Service: Orthopedics;  Laterality: Right;  Right Foot 3rd Ray Amputation  . AMPUTATION Right 12/16/2012   Procedure: Right Foot Transmetatarsal Amputation;  Surgeon: Newt Minion, MD;  Location: [facility];  Service: Orthopedics;  Laterality: Right;  . AMPUTATION Left 03/09/2015   Procedure: LEFT FOOT 1ST RAY AMPUTATION;  Surgeon: Newt Minion, MD;  Location: [facility];  Service: Orthopedics;  Laterality: Left;  . BLADDER SURGERY     x 2, tacked 1st time; mesh "eroded", had to be removed  . Bladder Tact   2002  . BREAST SURGERY Left    "knot removed"  . CARPAL TUNNEL RELEASE Left   . COLON SURGERY    . NASAL SEPTUM SURGERY  1976  . TONSILLECTOMY     age 44's    Family History  Problem Relation Age of Onset  . Diabetes Mother   . Mental illness Mother   . Bipolar disorder Mother   . Hypertension Father   . Diabetes Maternal Grandmother   . Heart disease Neg Hx     Social History:  reports that Dana [name] has never smoked. Dana [name] has never used smokeless tobacco. Dana [name] reports that Dana [name] does not drink alcohol or use drugs.    Review of Systems    Has diabetic neuropathy with sensory loss Taking  638m gabapentin and analgesics from PCP  DIABETIC foot ulcers with history of amputations  On the right leg Dana [name] has a prosthesis after Dana [name] BKA    Lipid history: On Lipitor and followed by PCP  Lab Results  Component Value Date   CHOL 128 01/20/2017   HDL 40.10 10/01/2016   LDLCALC 77 10/01/2016   TRIG 175.0 (H) 10/01/2016   CHOLHDL 4 10/01/2016             Physical Examination:  BP 122/72 (BP Location: Left Arm, Patient Position: Sitting, Cuff Size: Normal)   Pulse 81   Ht 5' (1.524 m)   Wt 183 lb 9.6 oz (83.3 kg)   SpO2 98%   BMI 35.86 kg/m        ASSESSMENT:  Diabetes type 2, uncontrolled     See history of present  illness for detailed discussion of  current management, blood sugar patterns and problems identified  Dana [name] blood sugars are totally out of control with Dana [name] not using any basal insulin or the pump Dana [name] is having some technical issues using the pump and also Dana [name] freestyle libre sensor and needs further education and training  Since Dana [name] thinks Dana [name] fasting sugar was low normal with only 1 day of using the V-go pump 40 unit basal we will try to 30 unit pump again with a sample   Recommendations: Given Dana [name] sample of the 30 unit V-go pump and this was started by the nurse educator today Dana [name] will need to try and give at least 10 units of bolus for breakfast and lunch and 12 at dinnertime for now More consistent diet Dana [name] will use an arm band to keep Dana [name] freestyle Libre in place  To discuss blood sugars by phone next week  Patient Instructions  30 units pump , 5 clicks at bfst and lunch and 6 at dinner  [place]: look for [employer], arm band  Call readings next week      Elayne Snare 11/09/2017, 12:01 PM   Note: This office note was prepared with Dragon voice recognition system technology. Any transcriptional errors that result from this process are unintentional.

## 2017-11-09 ENCOUNTER — Ambulatory Visit (INDEPENDENT_AMBULATORY_CARE_PROVIDER_SITE_OTHER): Payer: PPO | Admitting: Endocrinology

## 2017-11-09 ENCOUNTER — Encounter: Payer: PPO | Attending: Endocrinology | Admitting: Nutrition

## 2017-11-09 ENCOUNTER — Encounter: Payer: Self-pay | Admitting: Endocrinology

## 2017-11-09 VITALS — BP 122/72 | HR 81 | Ht 60.0 in | Wt 183.6 lb

## 2017-11-09 DIAGNOSIS — E1142 Type 2 diabetes mellitus with diabetic polyneuropathy: Secondary | ICD-10-CM

## 2017-11-09 DIAGNOSIS — Z794 Long term (current) use of insulin: Secondary | ICD-10-CM | POA: Insufficient documentation

## 2017-11-09 DIAGNOSIS — E1165 Type 2 diabetes mellitus with hyperglycemia: Secondary | ICD-10-CM

## 2017-11-09 DIAGNOSIS — Z713 Dietary counseling and surveillance: Secondary | ICD-10-CM | POA: Diagnosis not present

## 2017-11-09 DIAGNOSIS — IMO0002 Reserved for concepts with insufficient information to code with codable children: Secondary | ICD-10-CM

## 2017-11-09 DIAGNOSIS — E1139 Type 2 diabetes mellitus with other diabetic ophthalmic complication: Secondary | ICD-10-CM

## 2017-11-09 NOTE — Progress Notes (Signed)
Pt was retrained on the V-go 30.  She filled the V-go as directed.  Says it falls off some times, and some times the white button pops up, and she does not get the insulin.  Discussed the need to put a new V-g on when it falls off, or when the needle insertion button pops up.  She reported good understanding of this. She was also given the Skin tac brochure and told to go to [facility] to order the bottle.  This will help the V-go stick better.  She agreed to do this.  We discussed the need to make sure that she presses the buttons before each meal and a bedtime snack.  She agreed to do this. I will call her in 1 week to see how she is doing.

## 2017-11-09 NOTE — Patient Instructions (Addendum)
30 units pump , 5 clicks at bfst and lunch and 6 at dinner  [place]: look for [employer], arm band  Call readings next week

## 2017-11-10 DIAGNOSIS — E11621 Type 2 diabetes mellitus with foot ulcer: Secondary | ICD-10-CM | POA: Diagnosis not present

## 2017-11-10 DIAGNOSIS — L97522 Non-pressure chronic ulcer of other part of left foot with fat layer exposed: Secondary | ICD-10-CM | POA: Diagnosis not present

## 2017-11-10 NOTE — Patient Instructions (Signed)
Order Skin Tac from [employer], and use before applying the V-Go. Test blood sugars before meals and at bedtime. Call if blood sugars go over 250.

## 2017-11-11 DIAGNOSIS — F3189 Other bipolar disorder: Secondary | ICD-10-CM | POA: Diagnosis not present

## 2017-11-13 ENCOUNTER — Telehealth: Payer: Self-pay | Admitting: Emergency Medicine

## 2017-11-13 DIAGNOSIS — E1142 Type 2 diabetes mellitus with diabetic polyneuropathy: Secondary | ICD-10-CM

## 2017-11-13 NOTE — Telephone Encounter (Signed)
Absolutely. Let's refer her to Dr. Almetta Lovely office.

## 2017-11-13 NOTE — Telephone Encounter (Signed)
Advised patient of referral for a new endocrinology-Dr Balan Her BS running 300's Patient is upset that her sugars are still elevated and he is not listening to her about her sugars and medications She wants her leg to heal. She is agreeable.

## 2017-11-13 NOTE — Telephone Encounter (Signed)
Please advise of another Endocrinologist patient can go to  Copied from [place] [phone]. Topic: Referral - Request >> Nov 13, 2017 11:19 AM Rutherford Nail, NT wrote: Reason for CRM: patient is wanting to know if Dana [name] knows another Endocrinologist that he could refer her to. States that Dr Dwyane Dee is not listening to her and she does not want to continue seeing him. Please advise. CB#: [phone]

## 2017-11-16 ENCOUNTER — Other Ambulatory Visit: Payer: Self-pay

## 2017-11-16 ENCOUNTER — Telehealth: Payer: Self-pay | Admitting: Nutrition

## 2017-11-16 MED ORDER — INSULIN GLARGINE 300 UNIT/ML ~~LOC~~ SOPN: 45.0000 [IU] | PEN_INJECTOR | Freq: Every day | SUBCUTANEOUS | 3 refills | Status: DC

## 2017-11-16 NOTE — Telephone Encounter (Signed)
She will need a prescription for Toujeo unless she has this already  She will continue regular insulin which she is currently using, the doses mentioned for Humalog were supposed to be for regular insulin

## 2017-11-16 NOTE — Telephone Encounter (Signed)
Noted message left on my desk that  Blood sugars are "still high"    Patient reported that she tried the V-gos Wednesday and Thursdays.  Blood sugars were in the 712R doing 2/4/7 click ac meals,  On Friday the V-Gos kept falling off.  She put on 5, and 5 fell off. She started back on her injections:  Toujeo: 35u at HS, and Humalog: 6/6/8.Blood sugar on Saturday: Saturday: 432acB,  407 acL   313 acS  Took 1st dose of toujeo Sunday: 116 acB    232 acL  (ate out, no insulin)   407 Today:  293  Not taking extra Humalog when high.

## 2017-11-16 NOTE — Telephone Encounter (Signed)
There is no prescription for Toujeo on her medication list and she is using regular insulin at bedtime.  Please find out if she needs a prescription.  She will need to start using 45 units of this at night daily along with 10 units of Humalog at breakfast and lunch and 12 at dinnertime

## 2017-11-16 NOTE — Telephone Encounter (Signed)
Please clarify: need to check and see if pt needs an Rx for Toujeo or Regular insulin?\ She needs to take 45 units at night of what kind of insulin?

## 2017-11-16 NOTE — Telephone Encounter (Signed)
Pt called and given MD message. New Rx sent for Toujeo 45 units at HS.

## 2017-11-17 DIAGNOSIS — H35371 Puckering of macula, right eye: Secondary | ICD-10-CM | POA: Diagnosis not present

## 2017-11-17 HISTORY — PX: EYE SURGERY: SHX253

## 2017-11-19 ENCOUNTER — Encounter: Payer: Self-pay | Admitting: Internal Medicine

## 2017-11-19 ENCOUNTER — Ambulatory Visit (INDEPENDENT_AMBULATORY_CARE_PROVIDER_SITE_OTHER): Payer: PPO | Admitting: Internal Medicine

## 2017-11-19 VITALS — BP 134/82 | HR 78 | Ht 60.0 in | Wt 189.0 lb

## 2017-11-19 DIAGNOSIS — K760 Fatty (change of) liver, not elsewhere classified: Secondary | ICD-10-CM | POA: Diagnosis not present

## 2017-11-19 DIAGNOSIS — Z86010 Personal history of colonic polyps: Secondary | ICD-10-CM | POA: Diagnosis not present

## 2017-11-19 DIAGNOSIS — R197 Diarrhea, unspecified: Secondary | ICD-10-CM | POA: Diagnosis not present

## 2017-11-19 DIAGNOSIS — K219 Gastro-esophageal reflux disease without esophagitis: Secondary | ICD-10-CM | POA: Diagnosis not present

## 2017-11-19 DIAGNOSIS — R1310 Dysphagia, unspecified: Secondary | ICD-10-CM | POA: Diagnosis not present

## 2017-11-19 MED ORDER — NA SULFATE-K SULFATE-MG SULF 17.5-3.13-1.6 GM/177ML PO SOLN: 1.0000 | Freq: Once | ORAL | 0 refills | Status: AC

## 2017-11-19 NOTE — Patient Instructions (Signed)

## 2017-11-20 ENCOUNTER — Other Ambulatory Visit: Payer: Self-pay | Admitting: Physician Assistant

## 2017-11-20 ENCOUNTER — Encounter: Payer: Self-pay | Admitting: Internal Medicine

## 2017-11-20 DIAGNOSIS — K219 Gastro-esophageal reflux disease without esophagitis: Secondary | ICD-10-CM

## 2017-11-20 NOTE — Progress Notes (Signed)
HISTORY OF PRESENT ILLNESS:  Dana [name] is a 62 y.o. female with multiple medical problems including poorly controlled diabetes mellitus, ALT complications of diabetes including diabetic neuropathy, infected right lower extremity status post BKA January 2019, obesity, and multiple surgeries who is sent today by [name] PA-C regarding multiple GI complaints. First, the patient complains of chronic diarrhea. She states that this began 17 years ago after complicated urologic surgery. She tells me that she underwent some type of evaluation at [facility] which may have included colonoscopy and upper endoscopy. She states that she had colon polyps. No details. Terms for diarrhea, she describes 2 or 3 bowel movements per day which are formed. However she also reports intermittent problems with multiple loose stools. Occasional incontinence. Difficulties can occur for several weeks at a time and then be absent for several weeks. She has been on metformin for 2-3 years at 2 g per day. She also complains of intermittent solid food dysphagia. She does have a history of classic acid reflux which is improved with pantoprazole 40 mg daily. No vomiting. She does have bloating and belching. No rectal bleeding. Review of outside laboratories from 10/26/2017 find unremarkable basic metabolic panel except for elevated glucose. CBC May 2019 with mild anemia which was normocytic. Hemoglobin 11.7. Abdominal ultrasound September 2018 to evaluate elevated alkaline phosphatase revealed changes consistent with fatty liver.  REVIEW OF SYSTEMS:  All non-GI ROS negative unless otherwise stated in the history of present illness except for depression, arthritis, shortness of breath, paresthesias, urinary incontinence  Past Medical History:  Diagnosis Date  . Bipolar 1 disorder ([place])   . Cellulitis and abscess of foot 03/08/2015  . Diabetes mellitus    INSULIN DEPENDENT  . Diabetic neuropathy ([place])   . Diabetic neuropathy ([place])    . 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 H/O hiatal hernia   . Hyperlipidemia   . Hypertension    past hx of  . Iron malabsorption 09/26/2015  . Numbness and tingling    Hx; of in B/LLE and B/LUE  . Other iron deficiency anemias 09/20/2015    Past Surgical History:  Procedure Laterality Date  . ABDOMINAL HYSTERECTOMY    . ADENOIDECTOMY     Hx: of  . AMPUTATION  10/24/2011   Procedure: AMPUTATION RAY;  Surgeon: Newt Minion, MD;  Location: [facility];  Service: Orthopedics;  Laterality: Right;  Right Foot 3rd Ray Amputation  . AMPUTATION Right 12/16/2012   Procedure: Right Foot Transmetatarsal Amputation;  Surgeon: Newt Minion, MD;  Location: [facility];  Service: Orthopedics;  Laterality: Right;  . AMPUTATION Left 03/09/2015   Procedure: LEFT FOOT 1ST RAY AMPUTATION;  Surgeon: Newt Minion, MD;  Location: [facility];  Service: Orthopedics;  Laterality: Left;  . BLADDER SURGERY     x 2, tacked 1st time; mesh "eroded", had to be removed  . Bladder Tact   2002  . BREAST SURGERY Left    "knot removed"  . CARPAL TUNNEL RELEASE Left   . COLON SURGERY    . EYE SURGERY  11/17/2017  . NASAL SEPTUM SURGERY  1976  . TONSILLECTOMY     age 70's    Social History Dana [name]  reports that she has never smoked. She has never used smokeless tobacco. She reports that she does not drink alcohol or use drugs.  family history includes Bipolar disorder in her mother; Diabetes in her maternal grandmother and mother; Hypertension in her father; Mental illness in her mother.  Allergies  Allergen Reactions  . Keflex [Cephalexin]     Tremors, rash, hard to breathe  . Lithium Nausea And Vomiting and Other (See Comments)    Other reaction(s): Other (See Comments) Can not keep this medication down. It makes her terribly ill. Can not keep this medication down. It makes her terribly ill.  . Ativan [Lorazepam] Other (See Comments)    Other reaction(s): ANAPHYLAXIS Other reaction(s): Other (See  Comments) Abnormal behavior Abnormal behavior  . Demerol [Meperidine] Other (See Comments)    Abnormal behavior (sees things) Other reaction(s): ANAPHYLAXIS Other reaction(s): Other (See Comments) Abnormal behavior Abnormal behavior  . Oxycodone Other (See Comments)    Other reaction(s): OTHER Abnormal behavior  . Codeine Itching and Other (See Comments)    hallucinations  . Doxycycline Other (See Comments)    Severe muscle tremor  . Darvocet [Propoxyphene N-Acetaminophen] Itching  . Latex Itching    Other reaction(s): OTHER  . Propoxyphene Itching       PHYSICAL EXAMINATION: Vital signs: BP 134/82   Pulse 78   Ht 5' (1.524 m)   Wt 189 lb (85.7 kg)   BMI 36.91 kg/m   Constitutional: obese, chronically ill-appearing, no acute distress Psychiatric: alert and oriented x3, cooperative Eyes: extraocular movements intact, anicteric, conjunctiva pink Mouth: POOR DENTITION.oral pharynx moist, no lesions Neck: supple no lymphadenopathy Cardiovascular: heart regular rate and rhythm, no murmur Lungs: clear to auscultation bilaterally Abdomen: soft,obese, nontender, nondistended, no obvious ascites, no peritoneal signs, normal bowel sounds, no organomegaly Rectal:deferred until colonoscopy Extremities: no clubbing or cyanosis. Tracelower extremity edema on the left. Status post right BKA Skin: no clinically relevant lesions on visible extremities Neuro: No focal deficits. Cranial nerves intact.No asterixis.  ASSESSMENT:  #1. Intermittent problems with diarrhea. Chronic. Occasional incontinence. A be metformin. May be diabetic diarrhea. Rule out microscopic colitis #2. Intermittent solid food dysphagia. Rule out peptic stricture #3. Chronic GERD. Requiring PPI for control #4. Fatty liver #5. Poorly controlled diabetes mellitus. Insulin requiring #6. Obesity, morbid   PLAN:  #1. Colonoscopy, with biopsies, to evaluate diarrhea and provide surveillance given history of  "polyps". The patient is HIGH RISK given her comorbidities, poorly controlled insulin requiring diabetes, and body habitus.The nature of the procedure, as well as the risks, benefits, and alternatives were carefully and thoroughly reviewed with the patient. Ample time for discussion and questions allowed. The patient understood, was satisfied, and agreed to proceed. #2. Upper endoscopy with possible esophageal dilation. The patient is HIGH RISK as above.The nature of the procedure, as well as the risks, benefits, and alternatives were carefully and thoroughly reviewed with the patient. Ample time for discussion and questions allowed. The patient understood, was satisfied, and agreed to proceed. #3. Continue PPI #4. Consider trial of holding metformin under the supervision of her primary care provider to see if this improves bowel habits #5. Other considerations include fiber supplementation #6. Weight loss #7. The diabetic control. Working close with PCP #8. Reflux precautions  A copy of this consultation note has been sent to Mr. Hassell Done

## 2017-11-23 ENCOUNTER — Other Ambulatory Visit: Payer: Self-pay | Admitting: Physician Assistant

## 2017-11-23 DIAGNOSIS — E785 Hyperlipidemia, unspecified: Secondary | ICD-10-CM

## 2017-11-23 DIAGNOSIS — F3189 Other bipolar disorder: Secondary | ICD-10-CM | POA: Diagnosis not present

## 2017-11-24 ENCOUNTER — Ambulatory Visit (INDEPENDENT_AMBULATORY_CARE_PROVIDER_SITE_OTHER): Payer: PPO | Admitting: Psychology

## 2017-11-24 DIAGNOSIS — Z89511 Acquired absence of right leg below knee: Secondary | ICD-10-CM | POA: Diagnosis not present

## 2017-11-24 DIAGNOSIS — M79641 Pain in right hand: Secondary | ICD-10-CM | POA: Diagnosis not present

## 2017-11-24 DIAGNOSIS — L97522 Non-pressure chronic ulcer of other part of left foot with fat layer exposed: Secondary | ICD-10-CM | POA: Diagnosis not present

## 2017-11-24 DIAGNOSIS — E11621 Type 2 diabetes mellitus with foot ulcer: Secondary | ICD-10-CM | POA: Diagnosis not present

## 2017-11-24 DIAGNOSIS — L97521 Non-pressure chronic ulcer of other part of left foot limited to breakdown of skin: Secondary | ICD-10-CM | POA: Diagnosis not present

## 2017-11-24 DIAGNOSIS — F3181 Bipolar II disorder: Secondary | ICD-10-CM

## 2017-11-24 DIAGNOSIS — E1142 Type 2 diabetes mellitus with diabetic polyneuropathy: Secondary | ICD-10-CM | POA: Diagnosis not present

## 2017-11-24 DIAGNOSIS — E1165 Type 2 diabetes mellitus with hyperglycemia: Secondary | ICD-10-CM | POA: Diagnosis not present

## 2017-11-24 DIAGNOSIS — R200 Anesthesia of skin: Secondary | ICD-10-CM | POA: Diagnosis not present

## 2017-11-24 DIAGNOSIS — M14672 Charcot's joint, left ankle and foot: Secondary | ICD-10-CM | POA: Diagnosis not present

## 2017-11-24 DIAGNOSIS — Z9119 Patient's noncompliance with other medical treatment and regimen: Secondary | ICD-10-CM | POA: Diagnosis not present

## 2017-11-25 ENCOUNTER — Telehealth: Payer: Self-pay | Admitting: Endocrinology

## 2017-11-25 DIAGNOSIS — E113512 Type 2 diabetes mellitus with proliferative diabetic retinopathy with macular edema, left eye: Secondary | ICD-10-CM | POA: Diagnosis not present

## 2017-11-25 MED ORDER — SKIN TAC ADHESIVE BARRIER WIPE MISC: 1.0000 | Freq: Every day | 5 refills | Status: AC

## 2017-11-25 NOTE — Telephone Encounter (Signed)
Pt is requesting [place] call in the rx for the skin tac adhesive wipes called into [facility] # [phone]

## 2017-11-25 NOTE — Telephone Encounter (Signed)
  Rx submitted per patient's request.

## 2017-11-25 NOTE — Addendum Note (Signed)
Addended by: Verlin Grills T on: 11/25/2017 10:27 AM   Modules accepted: Orders

## 2017-11-27 ENCOUNTER — Telehealth: Payer: Self-pay | Admitting: Emergency Medicine

## 2017-11-27 NOTE — Telephone Encounter (Signed)
Pt called and is interesting in trying diabetic dexcom monitor. She is wondering if that is something that would be better to use rather than the [place] that doesn't want to stick. Please give her a call to discuss this with her. Thanks.

## 2017-11-27 NOTE — Telephone Encounter (Signed)
She will have to check with her insurance about coverage.  She needs to buy the armband to hold the freestyle libre in place, can get it through [employer].  Otherwise can apply a clear adhesive dressing over the freestyle [place]

## 2017-11-27 NOTE — Telephone Encounter (Signed)
Called pt and gave her MD message. Pt verbalized understanding. 

## 2017-11-30 ENCOUNTER — Ambulatory Visit: Payer: Self-pay | Admitting: Endocrinology

## 2017-11-30 ENCOUNTER — Other Ambulatory Visit: Payer: Self-pay

## 2017-11-30 ENCOUNTER — Encounter: Payer: Self-pay | Admitting: Endocrinology

## 2017-11-30 ENCOUNTER — Ambulatory Visit (INDEPENDENT_AMBULATORY_CARE_PROVIDER_SITE_OTHER): Payer: PPO | Admitting: Endocrinology

## 2017-11-30 VITALS — BP 142/80 | HR 78 | Ht 60.0 in | Wt 189.2 lb

## 2017-11-30 DIAGNOSIS — E1165 Type 2 diabetes mellitus with hyperglycemia: Secondary | ICD-10-CM | POA: Diagnosis not present

## 2017-11-30 DIAGNOSIS — Z794 Long term (current) use of insulin: Secondary | ICD-10-CM

## 2017-11-30 LAB — GLUCOSE, POCT (MANUAL RESULT ENTRY): POC GLUCOSE: 291 mg/dL — AB (ref 70–99)

## 2017-11-30 NOTE — Patient Instructions (Addendum)
Skin Tac Libre armband from [employer] to am and go to 50 units  Reg insulin 16 units at Bfst/lunch and 20 at supper

## 2017-11-30 NOTE — Progress Notes (Signed)
Patient ID: Dana [name], female   DOB: [birth year], 62 y.o.   MRN: [id number]           Reason for Appointment: Follow-up for Type 2 Diabetes  Referring physician: Elyn Aquas  History of Present Illness:          Date of diagnosis of type 2 diabetes mellitus: 2006       Background history:   She thinks her blood sugar was 300-400 at the time of diagnosis and she was started on insulin soon after this. She thinks she has been on metformin only for the last 5 years Her A1c history is available since only about 2013 and this had been consistently over 10% She  had  poor control of her diabetes with pre-consultation A1c of 13%, was on Levemir only  Although she did better with the V-go pump 30 in 2016 she stopped this because of increased cost after a short time  Recent history:    Her A1c is last 10.8, previously 7.2, has been as low as 6.7,  INSULIN regimen is: Regular insulin  02-19-11 units before meals, Toujeo 45 hs  previously using V-go pump, 30 unit basal, boluses 6 units at breakfast, 8 at lunch and 10-12 at dinner  Current blood sugar patterns, daily management and problems identified:  She still has not been using the V-go pump given that she was told by the nurse how to use this again and given information on getting the skin Tac from the pharmacy; also a prescription for this was sent  Also not using freestyle Libre sensor because it is not sticking well and was advised to get an arm band which she has not done so  With starting Toujeo a couple of weeks ago insulin along with her regular insulin her blood sugars are only slightly better in the morning but still quite high later in the day; she is taking about the same amount of insulin on her mealtime regular insulin as with the pump previously  She is compliant with her metformin  She thinks her blood sugars go up significantly after meals and frequently has readings over 300 even at lunchtime  She still has  difficulty eliminating regular soft drinks completely                          Non-insulin hypoglycemic drugs the patient is taking are: Metformin 1g bid  Side effects from medications have been: ? diarrhea from metformin   Compliance with the medical regimen: Good Hypoglycemia: As above    Glucose monitoring:  done 2-3 times a day         Glucometer:  FreeStyle libre and Freestyle lite .      Blood Glucose readings by recall:  FBS 117-328, rest 200-398  Self-care:  Typical meal intake: Breakfast is irregular otherwise may have toast and an egg or sausage.  Lunch is a sandwich or cheeseburger, evening meal is meat and 2 vegetables.  For snacks  will have peanut butter crackers in pms Dinner 5-7 pm               Dietician visit, most recent: Never               Exercise: none  Weight history: Stable over the last 3-4 years, was 400 pounds about 5 years ago  Wt Readings from Last 3 Encounters:  11/30/17 189 lb 3.2 oz (85.8 kg)  11/19/17 189 lb (  85.7 kg)  11/09/17 183 lb 9.6 oz (83.3 kg)    Glycemic control:   Lab Results  Component Value Date   HGBA1C 10.8 (A) 09/29/2017   HGBA1C 7.2 07/03/2017   HGBA1C 7.4 03/04/2017   Lab Results  Component Value Date   MICROALBUR 0.8 10/01/2016   LDLCALC 77 10/01/2016   CREATININE 1.18 10/26/2017       Allergies as of 11/30/2017      Reactions   Keflex [cephalexin]    Tremors, rash, hard to breathe   Lithium Nausea And Vomiting, Other (See Comments)   Other reaction(s): Other (See Comments) Can not keep this medication down. It makes her terribly ill. Can not keep this medication down. It makes her terribly ill.   Ativan [lorazepam] Other (See Comments)   Other reaction(s): ANAPHYLAXIS Other reaction(s): Other (See Comments) Abnormal behavior Abnormal behavior   Demerol [meperidine] Other (See Comments)   Abnormal behavior (sees things) Other reaction(s): ANAPHYLAXIS Other reaction(s): Other (See Comments) Abnormal  behavior Abnormal behavior   Oxycodone Other (See Comments)   Other reaction(s): OTHER Abnormal behavior   Codeine Itching, Other (See Comments)   hallucinations   Doxycycline Other (See Comments)   Severe muscle tremor   Darvocet [propoxyphene N-acetaminophen] Itching   Latex Itching   Other reaction(s): OTHER   Propoxyphene Itching      Medication List        Accurate as of 11/30/17  1:07 PM. Always use your most recent med list.          aspirin 325 MG EC tablet Take 325 mg by mouth daily.   atorvastatin 20 MG tablet Commonly known as:  LIPITOR TAKE 1 TABLET BY MOUTH AT BEDTIME   fluticasone 110 MCG/ACT inhaler Commonly known as:  FLOVENT HFA Inhale 1 puff into the lungs 2 (two) times daily.   freestyle lancets Use as instructed to check blood sugar 3 times per day dx code E11.39   FREESTYLE LITE test strip Generic drug:  glucose blood USE AS DIRECTED TO CHECK BLOOD SUGAR THREE TIMES DAILY   furosemide 20 MG tablet Commonly known as:  LASIX Take 1 tablet (20 mg total) by mouth 2 (two) times daily.   gabapentin 600 MG tablet Commonly known as:  NEURONTIN TAKE 1200 mg  BY MOUTH THREE TIMES DAILY   Insulin Glargine 300 UNIT/ML Sopn Commonly known as:  TOUJEO SOLOSTAR Inject 45 Units into the skin daily. TAKE 45 UNITS UNDER THE SKIN EVERY NIGHT AT 1OPM.   insulin regular 100 units/mL injection Commonly known as:  NOVOLIN R,HUMULIN R Inject into the skin 3 (three) times daily before meals. TAKE 10 UNITS UNDER THE SKIN IN THE MORNING WITH BREAKFAST, 10 AT LUNCH, 12 AT DINNER   INSULIN SYRINGE 1CC/29G 29G X 1/2" 1 ML Misc Use 1 per day.   LATUDA 20 MG Tabs tablet Generic drug:  lurasidone Take 20 mg by mouth. Taking 0.5 tablet and titration up to 1 tablet   metFORMIN 500 MG 24 hr tablet Commonly known as:  GLUCOPHAGE-XR Take 4 tablets (2,000 mg total) by mouth daily with supper.   OVER THE COUNTER MEDICATION Take 2 each by mouth daily. Centrum-Muli.  Vitamin Gummy   pantoprazole 40 MG tablet Commonly known as:  PROTONIX TAKE 1 TABLET BY MOUTH ONCE DAILY   SKIN TAC ADHESIVE BARRIER WIPE Misc 1 each by Does not apply route daily.       Allergies:  Allergies  Allergen Reactions  . Keflex [Cephalexin]  Tremors, rash, hard to breathe  . Lithium Nausea And Vomiting and Other (See Comments)    Other reaction(s): Other (See Comments) Can not keep this medication down. It makes her terribly ill. Can not keep this medication down. It makes her terribly ill.  . Ativan [Lorazepam] Other (See Comments)    Other reaction(s): ANAPHYLAXIS Other reaction(s): Other (See Comments) Abnormal behavior Abnormal behavior  . Demerol [Meperidine] Other (See Comments)    Abnormal behavior (sees things) Other reaction(s): ANAPHYLAXIS Other reaction(s): Other (See Comments) Abnormal behavior Abnormal behavior  . Oxycodone Other (See Comments)    Other reaction(s): OTHER Abnormal behavior  . Codeine Itching and Other (See Comments)    hallucinations  . Doxycycline Other (See Comments)    Severe muscle tremor  . Darvocet [Propoxyphene N-Acetaminophen] Itching  . Latex Itching    Other reaction(s): OTHER  . Propoxyphene Itching    Past Medical History:  Diagnosis Date  . Bipolar 1 disorder ([place])   . Cellulitis and abscess of foot 03/08/2015  . Diabetes mellitus    INSULIN DEPENDENT  . Diabetic neuropathy ([place])   . Diabetic neuropathy ([place])   . 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 H/O hiatal hernia   . Hyperlipidemia   . Hypertension    past hx of  . Iron malabsorption 09/26/2015  . Numbness and tingling    Hx; of in B/LLE and B/LUE  . Other iron deficiency anemias 09/20/2015    Past Surgical History:  Procedure Laterality Date  . ABDOMINAL HYSTERECTOMY    . ADENOIDECTOMY     Hx: of  . AMPUTATION  10/24/2011   Procedure: AMPUTATION RAY;  Surgeon: Newt Minion, MD;  Location: [facility];  Service: Orthopedics;   Laterality: Right;  Right Foot 3rd Ray Amputation  . AMPUTATION Right 12/16/2012   Procedure: Right Foot Transmetatarsal Amputation;  Surgeon: Newt Minion, MD;  Location: [facility];  Service: Orthopedics;  Laterality: Right;  . AMPUTATION Left 03/09/2015   Procedure: LEFT FOOT 1ST RAY AMPUTATION;  Surgeon: Newt Minion, MD;  Location: [facility];  Service: Orthopedics;  Laterality: Left;  . BLADDER SURGERY     x 2, tacked 1st time; mesh "eroded", had to be removed  . Bladder Tact   2002  . BREAST SURGERY Left    "knot removed"  . CARPAL TUNNEL RELEASE Left   . COLON SURGERY    . EYE SURGERY  11/17/2017  . NASAL SEPTUM SURGERY  1976  . TONSILLECTOMY     age 45's    Family History  Problem Relation Age of Onset  . Diabetes Mother   . Mental illness Mother   . Bipolar disorder Mother   . Hypertension Father   . Diabetes Maternal Grandmother   . Heart disease Neg Hx     Social History:  reports that she has never smoked. She has never used smokeless tobacco. She reports that she does not drink alcohol or use drugs.    Review of Systems   The following is a copy of the previous note:  Has diabetic neuropathy with sensory loss Taking  600mg  gabapentin and analgesics from PCP  DIABETIC foot ulcers with history of amputations  On the right leg she has a prosthesis after her BKA    Lipid history: On Lipitor and followed by PCP    Lab Results  Component Value Date   CHOL 128 01/20/2017   HDL 40.10 10/01/2016   LDLCALC 77 10/01/2016   TRIG 175.0 (H)  10/01/2016   CHOLHDL 4 10/01/2016             Physical Examination:  BP (!) 142/80 (BP Location: Left Arm, Patient Position: Sitting, Cuff Size: Normal)   Pulse 78   Ht 5' (1.524 m)   Wt 189 lb 3.2 oz (85.8 kg)   SpO2 98%   BMI 36.95 kg/m        ASSESSMENT:  Diabetes type 2, uncontrolled     See history of present illness for detailed discussion of  current management, blood sugar patterns and problems  identified  Her blood sugars are still out of control with not using the V-go pump and despite using 45 units of basal insulin using Toujeo  Although her fasting readings are not as high as before blood sugars are usually going up markedly the rest of the day Discussed how to use various options for having the V-go pump and the freestyle libre sensor stick or stay on She does need to try the 30 unit V-go pump as the 40 unit possibly caused low normal fasting readings  She will check with [facility] about getting the skin Tac and also get the armband for the freestyle libre from [employer] if possible And while she needs to increase her Toujeo up to 50 units and take it in the morning instead of evening Regular insulin will be increased up to 16 units at breakfast and lunch and 20 at suppertime for now However she will go back to her original boluses when she has the V-go pump She will call if she has any difficulties  Patient Instructions  Skin Tac Libre armband from [employer] to am and go to 50 units  Reg insulin 16 units at Bfst/lunch and 20 at supper      Elayne Snare 11/30/2017, 1:07 PM   Note: This office note was prepared with Dragon voice recognition system technology. Any transcriptional errors that result from this process are unintentional.

## 2017-12-01 DIAGNOSIS — F3189 Other bipolar disorder: Secondary | ICD-10-CM | POA: Diagnosis not present

## 2017-12-02 DIAGNOSIS — E113511 Type 2 diabetes mellitus with proliferative diabetic retinopathy with macular edema, right eye: Secondary | ICD-10-CM | POA: Diagnosis not present

## 2017-12-03 ENCOUNTER — Ambulatory Visit: Payer: Self-pay | Admitting: *Deleted

## 2017-12-03 NOTE — Telephone Encounter (Signed)
Pt states tingling right hand "For months." Reports worsening and now to pinky and ring finger left hand, also left foot. H/O right BKA, reports phantom pain. Reports poor fine motor right hand "Difficult to hold onto things." Denies any other symptoms. States saw her psychiatrist who stated "I may need my Neurontin increased."  Appt made for tomorrow with Daun Peacock. Instructed pt to call back of symptoms worsen.  Reason for Disposition . [1] Numbness or tingling in one or both hands AND [2] is a chronic symptom (recurrent or ongoing AND present > 4 weeks)  Answer Assessment - Initial Assessment Questions 1. SYMPTOM: "What is the main symptom you are concerned about?" (e.g., weakness, numbness)    Tingling hands, foot 2. ONSET: "When did this start?" (minutes, hours, days; while sleeping)     2 months ago worsening 3. LAST NORMAL: "When was the last time you were normal (no symptoms)?"      4. PATTERN "Does this come and go, or has it been constant since it started?"  "Is it present now?"     Constant 5. CARDIAC SYMPTOMS: "Have you had any of the following symptoms: chest pain, difficulty breathing, palpitations?"     no 6. NEUROLOGIC SYMPTOMS: "Have you had any of the following symptoms: headache, dizziness, vision loss, double vision, changes in speech, unsteady on your feet?"     no 7. OTHER SYMPTOMS: "Do you have any other symptoms?"     Tingling and numbness right hand "Long time" worsening, now to left hand and foot  Protocols used: NEUROLOGIC DEFICIT-A-AH

## 2017-12-04 ENCOUNTER — Telehealth: Payer: Self-pay | Admitting: Emergency Medicine

## 2017-12-04 ENCOUNTER — Ambulatory Visit: Payer: PPO | Admitting: Physician Assistant

## 2017-12-04 NOTE — Telephone Encounter (Signed)
Patient has rescheduled appointment to 12/09/17  Copied from [place] #[id number]. Topic: Quick Communication - Appointment Cancellation >> Dec 03, 2017  5:31 PM Selinda Flavin B, [place] wrote: Patient called to cancel appointment scheduled for 12/04/17. Patient has rescheduled their appointment.  Route to department's PEC pool.

## 2017-12-06 ENCOUNTER — Other Ambulatory Visit: Payer: Self-pay | Admitting: Endocrinology

## 2017-12-07 ENCOUNTER — Telehealth: Payer: Self-pay | Admitting: Emergency Medicine

## 2017-12-07 ENCOUNTER — Other Ambulatory Visit: Payer: Self-pay

## 2017-12-07 MED ORDER — GLUCOSE BLOOD VI STRP: ORAL_STRIP | 3 refills | Status: DC

## 2017-12-07 MED ORDER — INSULIN GLARGINE 300 UNIT/ML ~~LOC~~ SOPN: 45.0000 [IU] | PEN_INJECTOR | Freq: Every day | SUBCUTANEOUS | 3 refills | Status: DC

## 2017-12-07 NOTE — Telephone Encounter (Signed)
Pt called and stated she needs a refill on Insulin Glargine (TOUJEO SOLOSTAR) 300 UNIT/ML SOPN and her test strips. Pharmacy is [facility]- in [place]. Thanks.

## 2017-12-07 NOTE — Telephone Encounter (Signed)
Rx sent 

## 2017-12-08 DIAGNOSIS — Z89511 Acquired absence of right leg below knee: Secondary | ICD-10-CM | POA: Diagnosis not present

## 2017-12-08 DIAGNOSIS — E11621 Type 2 diabetes mellitus with foot ulcer: Secondary | ICD-10-CM | POA: Diagnosis not present

## 2017-12-08 DIAGNOSIS — L97522 Non-pressure chronic ulcer of other part of left foot with fat layer exposed: Secondary | ICD-10-CM | POA: Diagnosis not present

## 2017-12-08 DIAGNOSIS — Z9119 Patient's noncompliance with other medical treatment and regimen: Secondary | ICD-10-CM | POA: Diagnosis not present

## 2017-12-08 DIAGNOSIS — E11610 Type 2 diabetes mellitus with diabetic neuropathic arthropathy: Secondary | ICD-10-CM | POA: Diagnosis not present

## 2017-12-08 DIAGNOSIS — Z7984 Long term (current) use of oral hypoglycemic drugs: Secondary | ICD-10-CM | POA: Diagnosis not present

## 2017-12-08 DIAGNOSIS — Z89432 Acquired absence of left foot: Secondary | ICD-10-CM | POA: Diagnosis not present

## 2017-12-08 DIAGNOSIS — E1142 Type 2 diabetes mellitus with diabetic polyneuropathy: Secondary | ICD-10-CM | POA: Diagnosis not present

## 2017-12-08 DIAGNOSIS — L97521 Non-pressure chronic ulcer of other part of left foot limited to breakdown of skin: Secondary | ICD-10-CM | POA: Diagnosis not present

## 2017-12-09 ENCOUNTER — Telehealth: Payer: Self-pay | Admitting: Endocrinology

## 2017-12-09 ENCOUNTER — Ambulatory Visit: Payer: PPO | Admitting: Physician Assistant

## 2017-12-09 ENCOUNTER — Inpatient Hospital Stay: Payer: PPO | Attending: Hematology & Oncology | Admitting: Hematology & Oncology

## 2017-12-09 ENCOUNTER — Inpatient Hospital Stay: Payer: PPO

## 2017-12-09 ENCOUNTER — Other Ambulatory Visit: Payer: Self-pay | Admitting: Endocrinology

## 2017-12-09 ENCOUNTER — Other Ambulatory Visit: Payer: Self-pay

## 2017-12-09 VITALS — BP 146/81 | HR 95 | Temp 98.0°F | Resp 20 | Wt 190.0 lb

## 2017-12-09 DIAGNOSIS — N189 Chronic kidney disease, unspecified: Secondary | ICD-10-CM | POA: Diagnosis not present

## 2017-12-09 DIAGNOSIS — IMO0002 Reserved for concepts with insufficient information to code with codable children: Secondary | ICD-10-CM

## 2017-12-09 DIAGNOSIS — D508 Other iron deficiency anemias: Secondary | ICD-10-CM

## 2017-12-09 DIAGNOSIS — K909 Intestinal malabsorption, unspecified: Secondary | ICD-10-CM

## 2017-12-09 DIAGNOSIS — D509 Iron deficiency anemia, unspecified: Secondary | ICD-10-CM | POA: Diagnosis not present

## 2017-12-09 DIAGNOSIS — E1169 Type 2 diabetes mellitus with other specified complication: Secondary | ICD-10-CM

## 2017-12-09 DIAGNOSIS — E1122 Type 2 diabetes mellitus with diabetic chronic kidney disease: Secondary | ICD-10-CM | POA: Diagnosis not present

## 2017-12-09 DIAGNOSIS — Z794 Long term (current) use of insulin: Principal | ICD-10-CM

## 2017-12-09 DIAGNOSIS — D638 Anemia in other chronic diseases classified elsewhere: Secondary | ICD-10-CM

## 2017-12-09 DIAGNOSIS — D631 Anemia in chronic kidney disease: Secondary | ICD-10-CM

## 2017-12-09 DIAGNOSIS — E11319 Type 2 diabetes mellitus with unspecified diabetic retinopathy without macular edema: Secondary | ICD-10-CM

## 2017-12-09 DIAGNOSIS — E1165 Type 2 diabetes mellitus with hyperglycemia: Principal | ICD-10-CM

## 2017-12-09 LAB — IRON AND TIBC
IRON: 38 ug/dL — AB (ref 41–142)
Saturation Ratios: 16 % — ABNORMAL LOW (ref 21–57)
TIBC: 244 ug/dL (ref 236–444)
UIBC: 205 ug/dL

## 2017-12-09 LAB — CBC WITH DIFFERENTIAL (CANCER CENTER ONLY)
Basophils Absolute: 0 10*3/uL (ref 0.0–0.1)
Basophils Relative: 0 %
EOS ABS: 0.2 10*3/uL (ref 0.0–0.5)
EOS PCT: 2 %
HCT: 34.6 % — ABNORMAL LOW (ref 34.8–46.6)
HEMOGLOBIN: 11.5 g/dL — AB (ref 11.6–15.9)
Lymphocytes Relative: 16 %
Lymphs Abs: 1.6 10*3/uL (ref 0.9–3.3)
MCH: 32.2 pg (ref 26.0–34.0)
MCHC: 33.2 g/dL (ref 32.0–36.0)
MCV: 96.9 fL (ref 81.0–101.0)
Monocytes Absolute: 0.7 10*3/uL (ref 0.1–0.9)
Monocytes Relative: 7 %
NEUTROS PCT: 75 %
Neutro Abs: 7.4 10*3/uL — ABNORMAL HIGH (ref 1.5–6.5)
PLATELETS: 273 10*3/uL (ref 145–400)
RBC: 3.57 MIL/uL — AB (ref 3.70–5.32)
RDW: 12.2 % (ref 11.1–15.7)
WBC: 9.9 10*3/uL (ref 3.9–10.0)

## 2017-12-09 LAB — CMP (CANCER CENTER ONLY)
ALBUMIN: 3.6 g/dL (ref 3.5–5.0)
ALK PHOS: 96 U/L (ref 38–126)
ALT: 22 U/L (ref 0–44)
ANION GAP: 7 (ref 5–15)
AST: 17 U/L (ref 15–41)
BUN: 22 mg/dL — ABNORMAL HIGH (ref 6–20)
CALCIUM: 9.5 mg/dL (ref 8.9–10.3)
CHLORIDE: 103 mmol/L (ref 98–111)
CO2: 30 mmol/L (ref 22–32)
CREATININE: 1.15 mg/dL — AB (ref 0.44–1.00)
GFR, EST AFRICAN AMERICAN: 59 mL/min — AB (ref >60)
GFR, Estimated: 51 mL/min — ABNORMAL LOW (ref >60)
GLUCOSE: 302 mg/dL — AB (ref 70–99)
Potassium: 4.6 mmol/L (ref 3.5–5.1)
SODIUM: 140 mmol/L (ref 135–145)
Total Bilirubin: 0.5 mg/dL (ref 0.3–1.2)
Total Protein: 7 g/dL (ref 6.5–8.1)

## 2017-12-09 LAB — FERRITIN: FERRITIN: 366 ng/mL — AB (ref 11–307)

## 2017-12-09 LAB — RETICULOCYTES
RBC.: 3.54 MIL/uL — AB (ref 3.70–5.45)
Retic Count, Absolute: 60.2 10*3/uL (ref 33.7–90.7)
Retic Ct Pct: 1.7 % (ref 0.7–2.1)

## 2017-12-09 MED ORDER — METFORMIN HCL ER 500 MG PO TB24: ORAL_TABLET | ORAL | 1 refills | Status: AC

## 2017-12-09 MED ORDER — INSULIN GLARGINE 300 UNIT/ML ~~LOC~~ SOPN: 50.0000 [IU] | PEN_INJECTOR | Freq: Every day | SUBCUTANEOUS | 1 refills | Status: DC

## 2017-12-09 NOTE — Telephone Encounter (Signed)
Pt was called and adivsed of proper dosing. According to last office visit notes, pt is supposed to be taking 50 units in the morning and this is it. Pt called and informed of this and pt verbalized understanding.

## 2017-12-09 NOTE — Telephone Encounter (Signed)
Please call patient at ph# [phone] re: Toujeo questions. Patient was told to take 45 in the a.m. And 45 in the p.m. If that is correct patient needs more Toujeo-Pharmacy is [facility] in [place] but Pharmacy told pt it is too soon to get more

## 2017-12-09 NOTE — Progress Notes (Signed)
Hematology and Oncology Follow Up Visit  Dana [name] [id number] 1956/01/02 62 y.o. 12/09/2017   Principle Diagnosis:  Iron deficiency anemia Poorly controlled diabetes type II  Current Therapy:   IV iron as indicated - last received in December 2018    Interim History:  Dana [name] is here today for a follow-up. 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  She will will receive a new right leg prosthetic this Friday.  This hopefully will be a permanent one that she will be able to manage with nicely.    Her last iron studies back in August showed a ferritin of 791 with iron saturation 18%.  She does feel a little tired. It would not surprise me if her iron levels are low.  She's had no problems with bowels or bladder. Her blood sugars have been doing pretty well.  Overall, I see that her performance status is ECOG 2.   Medications:  Allergies as of 12/09/2017      Reactions   Keflex [cephalexin]    Tremors, rash, hard to breathe   Lithium Nausea And Vomiting, Other (See Comments)   Other reaction(s): Other (See Comments) Can not keep this medication down. It makes her terribly ill. Can not keep this medication down. It makes her terribly ill.   Ativan [lorazepam] Other (See Comments)   Other reaction(s): ANAPHYLAXIS Other reaction(s): Other (See Comments) Abnormal behavior Abnormal behavior   Demerol [meperidine] Other (See Comments)   Abnormal behavior (sees things) Other reaction(s): ANAPHYLAXIS Other reaction(s): Other (See Comments) Abnormal behavior Abnormal behavior   Oxycodone Other (See Comments)   Other reaction(s): OTHER Abnormal behavior   Codeine Itching, Other (See Comments)   hallucinations   Doxycycline Other (See Comments)   Severe muscle tremor   Darvocet [propoxyphene N-acetaminophen] Itching   Latex Itching   Other reaction(s): OTHER   Propoxyphene Itching      Medication List        Accurate as of 12/09/17 10:10 AM. Always use your most recent med  list.          aspirin 325 MG EC tablet Take 325 mg by mouth daily.   atorvastatin 20 MG tablet Commonly known as:  LIPITOR TAKE 1 TABLET BY MOUTH AT BEDTIME   fluticasone 110 MCG/ACT inhaler Commonly known as:  FLOVENT HFA Inhale 1 puff into the lungs 2 (two) times daily.   freestyle lancets Use as instructed to check blood sugar 3 times per day dx code E11.39   furosemide 20 MG tablet Commonly known as:  LASIX Take 1 tablet (20 mg total) by mouth 2 (two) times daily.   gabapentin 600 MG tablet Commonly known as:  NEURONTIN TAKE 1200 mg  BY MOUTH THREE TIMES DAILY   glucose blood test strip Commonly known as:  FREESTYLE LITE USE TO CHECK BLOOD SUGAR THREE TIMES DAILY AS DIRECTED   Insulin Glargine 300 UNIT/ML Sopn Commonly known as:  TOUJEO SOLOSTAR Inject 45 Units into the skin daily. TAKE 45 UNITS UNDER THE SKIN EVERY NIGHT AT 1OPM.   insulin regular 100 units/mL injection Commonly known as:  NOVOLIN R,HUMULIN R Inject into the skin 3 (three) times daily before meals. TAKE 10 UNITS UNDER THE SKIN IN THE MORNING WITH BREAKFAST, 10 AT LUNCH, 12 AT DINNER   INSULIN SYRINGE 1CC/29G 29G X 1/2" 1 ML Misc Use 1 per day.   LATUDA 20 MG Tabs tablet Generic drug:  lurasidone Take 20 mg by mouth. Taking 0.5 tablet and titration up  to 1 tablet   metFORMIN 500 MG 24 hr tablet Commonly known as:  GLUCOPHAGE-XR Take 4 tablets (2,000 mg total) by mouth daily with supper.   OVER THE COUNTER MEDICATION Take 2 each by mouth daily. Centrum-Muli. Vitamin Gummy   pantoprazole 40 MG tablet Commonly known as:  PROTONIX TAKE 1 TABLET BY MOUTH ONCE DAILY   SKIN TAC ADHESIVE BARRIER WIPE Misc 1 each by Does not apply route daily.       Allergies:  Allergies  Allergen Reactions  . Keflex [Cephalexin]     Tremors, rash, hard to breathe  . Lithium Nausea And Vomiting and Other (See Comments)    Other reaction(s): Other (See Comments) Can not keep this medication down. It  makes her terribly ill. Can not keep this medication down. It makes her terribly ill.  . Ativan [Lorazepam] Other (See Comments)    Other reaction(s): ANAPHYLAXIS Other reaction(s): Other (See Comments) Abnormal behavior Abnormal behavior  . Demerol [Meperidine] Other (See Comments)    Abnormal behavior (sees things) Other reaction(s): ANAPHYLAXIS Other reaction(s): Other (See Comments) Abnormal behavior Abnormal behavior  . Oxycodone Other (See Comments)    Other reaction(s): OTHER Abnormal behavior  . Codeine Itching and Other (See Comments)    hallucinations  . Doxycycline Other (See Comments)    Severe muscle tremor  . Darvocet [Propoxyphene N-Acetaminophen] Itching  . Latex Itching    Other reaction(s): OTHER  . Propoxyphene Itching    Past Medical History, Surgical history, Social history, and Family History were reviewed and updated.  Review of Systems: All other 10 point review of systems is negative.   Physical Exam:  weight is 190 lb (86.2 kg). Her temperature is 98 F (36.7 C). Her blood pressure is 146/81 (abnormal) and her pulse is 95. Her respiration is 20 and oxygen saturation is 99%.   Wt Readings from Last 3 Encounters:  12/09/17 190 lb (86.2 kg)  11/30/17 189 lb 3.2 oz (85.8 kg)  11/19/17 189 lb (85.7 kg)    Fairly well-developed and well-nourished white female. Head and neck exam shows no ocular or oral lesions. She has no scleral icterus. There is no adenopathy in the neck. Lungs are clear bilaterally. Cardiac exam regular rate and rhythm with no murmurs, rubs or bruits. Abdomen is soft. She is mildly obese. She has good bowel sounds. There is no fluid wave. There is no palpable liver or spleen tip. Extremities shows the right BKA. She has a walking brace on her left lower leg. Neurological exam shows no focal neurological deficits. Skin exam shows some scattered ecchymoses.   Lab Results  Component Value Date   WBC 9.9 12/09/2017   HGB 11.5 (L)  12/09/2017   HCT 34.6 (L) 12/09/2017   MCV 96.9 12/09/2017   PLT 273 12/09/2017   Lab Results  Component Value Date   FERRITIN 791 (H) 12/18/2016   IRON 46 12/18/2016   TIBC 250 12/18/2016   UIBC 204 12/18/2016   IRONPCTSAT 18 (L) 12/18/2016   Lab Results  Component Value Date   RETICCTPCT 1.3 02/04/2016   RBC 3.57 (L) 12/09/2017   No results found for: KPAFRELGTCHN, LAMBDASER, KAPLAMBRATIO No results found for: IGGSERUM, IGA, IGMSERUM No results found for: Ronnald Ramp, A1GS, A2GS, Violet Baldy, MSPIKE, SPEI   Chemistry      Component Value Date/Time   NA 139 10/26/2017 0944   NA 140 12/18/2016 1156   K 5.1 10/26/2017 0944   K 4.9 12/18/2016 1156  CL 99 10/26/2017 0944   CL 104 09/18/2016 1052   CO2 30 10/26/2017 0944   CO2 28 12/18/2016 1156   BUN 23 10/26/2017 0944   BUN 19.9 12/18/2016 1156   CREATININE 1.18 10/26/2017 0944   CREATININE 1.40 (H) 10/20/2017 1551   CREATININE 0.8 12/18/2016 1156      Component Value Date/Time   CALCIUM 10.0 10/26/2017 0944   CALCIUM 9.8 12/18/2016 1156   ALKPHOS 209 (H) 01/22/2017 0314   ALKPHOS 213 (H) 12/18/2016 1156   AST 12 (L) 01/22/2017 0314   AST 14 12/18/2016 1156   ALT 25 01/22/2017 0314   ALT 19 12/18/2016 1156   BILITOT 0.2 (L) 01/22/2017 0314   BILITOT 0.39 12/18/2016 1156     Impression and Plan: Dana [name] is a very pleasant 62 yo white female with history of iron deficiency anemia and erythropoietin deficiency secondary to uncontrolled diabetes.  We will see what studies show. It would not surprise me if we had to give her some iron.  We have not yet given her Aranesp. I'll try to hold off on this as long as possible.  I will plan to get her back in 1 year.  We will see what her iron shows.  May be low iron is why she does feel tired.   Volanda Napoleon, MD 7/31/201910:10 AM

## 2017-12-10 ENCOUNTER — Telehealth: Payer: Self-pay | Admitting: *Deleted

## 2017-12-10 DIAGNOSIS — M19041 Primary osteoarthritis, right hand: Secondary | ICD-10-CM | POA: Diagnosis not present

## 2017-12-10 DIAGNOSIS — G5601 Carpal tunnel syndrome, right upper limb: Secondary | ICD-10-CM | POA: Diagnosis not present

## 2017-12-10 NOTE — Telephone Encounter (Addendum)
Patient is aware of results. Appointment made  ----- Message from Volanda Napoleon, MD sent at 12/09/2017  4:55 PM EDT ----- Call - iron level is low.  Need 1 dose of feraheme!!!  Laurey Arrow

## 2017-12-11 ENCOUNTER — Ambulatory Visit: Payer: PPO | Admitting: Physician Assistant

## 2017-12-11 ENCOUNTER — Ambulatory Visit: Payer: Self-pay | Admitting: Physician Assistant

## 2017-12-11 ENCOUNTER — Telehealth: Payer: Self-pay | Admitting: Endocrinology

## 2017-12-11 DIAGNOSIS — Z0289 Encounter for other administrative examinations: Secondary | ICD-10-CM

## 2017-12-11 NOTE — Telephone Encounter (Signed)
[place], [place]   Patient stated that she is completely out of this and would like this sent today    Insulin Glargine (TOUJEO SOLOSTAR) 300 UNIT/ML SOPN

## 2017-12-11 NOTE — Telephone Encounter (Signed)
Rx was sent to pharmacy listed below on 12/09/17.

## 2017-12-14 ENCOUNTER — Other Ambulatory Visit: Payer: Self-pay

## 2017-12-14 ENCOUNTER — Telehealth: Payer: Self-pay | Admitting: Endocrinology

## 2017-12-14 ENCOUNTER — Inpatient Hospital Stay: Payer: PPO | Attending: Hematology & Oncology

## 2017-12-14 VITALS — BP 142/71 | HR 83 | Temp 98.8°F | Resp 20

## 2017-12-14 DIAGNOSIS — D509 Iron deficiency anemia, unspecified: Secondary | ICD-10-CM | POA: Diagnosis not present

## 2017-12-14 DIAGNOSIS — K909 Intestinal malabsorption, unspecified: Secondary | ICD-10-CM

## 2017-12-14 DIAGNOSIS — D508 Other iron deficiency anemias: Secondary | ICD-10-CM

## 2017-12-14 DIAGNOSIS — D631 Anemia in chronic kidney disease: Secondary | ICD-10-CM

## 2017-12-14 MED ORDER — SODIUM CHLORIDE 0.9 % IV SOLN
510.0000 mg | Freq: Once | INTRAVENOUS | Status: AC
  Administered 2017-12-14: 510 mg via INTRAVENOUS
  Filled 2017-12-14: qty 17

## 2017-12-14 MED ORDER — SODIUM CHLORIDE 0.9 % IV SOLN
Freq: Once | INTRAVENOUS | Status: AC
  Administered 2017-12-14: 14:00:00 via INTRAVENOUS
  Filled 2017-12-14: qty 250

## 2017-12-14 NOTE — Telephone Encounter (Signed)
Called pharmacy and clarified that last office notes stated that pt is to begin taking 50 units of Toujeo Q morning.

## 2017-12-14 NOTE — Patient Instructions (Signed)

## 2017-12-14 NOTE — Telephone Encounter (Signed)
Insulin Glargine (TOUJEO SOLOSTAR) 300 UNIT/ML SOPN   Patient stated that the pharmacy did not receive the medication that was sent in on Friday. And would like resent to the pharmacy. She is also confused if this is the one she should be taking in the AM. Please advise     [place], [place]

## 2017-12-21 ENCOUNTER — Ambulatory Visit: Payer: Self-pay | Admitting: Psychology

## 2017-12-21 ENCOUNTER — Other Ambulatory Visit: Payer: Self-pay | Admitting: Physician Assistant

## 2017-12-21 DIAGNOSIS — E875 Hyperkalemia: Secondary | ICD-10-CM

## 2017-12-21 DIAGNOSIS — F3189 Other bipolar disorder: Secondary | ICD-10-CM | POA: Diagnosis not present

## 2017-12-23 DIAGNOSIS — E1140 Type 2 diabetes mellitus with diabetic neuropathy, unspecified: Secondary | ICD-10-CM | POA: Diagnosis not present

## 2017-12-23 DIAGNOSIS — E7800 Pure hypercholesterolemia, unspecified: Secondary | ICD-10-CM | POA: Diagnosis not present

## 2017-12-23 DIAGNOSIS — E1165 Type 2 diabetes mellitus with hyperglycemia: Secondary | ICD-10-CM | POA: Diagnosis not present

## 2017-12-23 DIAGNOSIS — E11319 Type 2 diabetes mellitus with unspecified diabetic retinopathy without macular edema: Secondary | ICD-10-CM | POA: Diagnosis not present

## 2017-12-24 ENCOUNTER — Ambulatory Visit: Payer: PPO | Admitting: Physician Assistant

## 2017-12-24 DIAGNOSIS — Z0289 Encounter for other administrative examinations: Secondary | ICD-10-CM

## 2017-12-25 ENCOUNTER — Other Ambulatory Visit: Payer: Self-pay | Admitting: Physician Assistant

## 2017-12-26 DIAGNOSIS — I4510 Unspecified right bundle-branch block: Secondary | ICD-10-CM | POA: Diagnosis not present

## 2017-12-26 DIAGNOSIS — E1165 Type 2 diabetes mellitus with hyperglycemia: Secondary | ICD-10-CM | POA: Diagnosis not present

## 2017-12-26 DIAGNOSIS — R0602 Shortness of breath: Secondary | ICD-10-CM | POA: Diagnosis not present

## 2017-12-26 DIAGNOSIS — I10 Essential (primary) hypertension: Secondary | ICD-10-CM | POA: Diagnosis not present

## 2017-12-26 DIAGNOSIS — R609 Edema, unspecified: Secondary | ICD-10-CM | POA: Diagnosis not present

## 2017-12-28 DIAGNOSIS — I4510 Unspecified right bundle-branch block: Secondary | ICD-10-CM | POA: Diagnosis not present

## 2017-12-28 DIAGNOSIS — I517 Cardiomegaly: Secondary | ICD-10-CM | POA: Diagnosis not present

## 2017-12-28 DIAGNOSIS — R9431 Abnormal electrocardiogram [ECG] [EKG]: Secondary | ICD-10-CM | POA: Diagnosis not present

## 2017-12-29 DIAGNOSIS — Z9111 Patient's noncompliance with dietary regimen: Secondary | ICD-10-CM | POA: Diagnosis not present

## 2017-12-29 DIAGNOSIS — Z9889 Other specified postprocedural states: Secondary | ICD-10-CM | POA: Diagnosis not present

## 2017-12-29 DIAGNOSIS — L97522 Non-pressure chronic ulcer of other part of left foot with fat layer exposed: Secondary | ICD-10-CM | POA: Diagnosis not present

## 2017-12-29 DIAGNOSIS — E11621 Type 2 diabetes mellitus with foot ulcer: Secondary | ICD-10-CM | POA: Diagnosis not present

## 2017-12-29 DIAGNOSIS — E11610 Type 2 diabetes mellitus with diabetic neuropathic arthropathy: Secondary | ICD-10-CM | POA: Diagnosis not present

## 2017-12-29 DIAGNOSIS — E1142 Type 2 diabetes mellitus with diabetic polyneuropathy: Secondary | ICD-10-CM | POA: Diagnosis not present

## 2017-12-29 DIAGNOSIS — L97521 Non-pressure chronic ulcer of other part of left foot limited to breakdown of skin: Secondary | ICD-10-CM | POA: Diagnosis not present

## 2017-12-30 ENCOUNTER — Ambulatory Visit: Payer: PPO | Admitting: Physician Assistant

## 2017-12-30 ENCOUNTER — Ambulatory Visit: Payer: Self-pay | Admitting: Endocrinology

## 2017-12-31 DIAGNOSIS — E113512 Type 2 diabetes mellitus with proliferative diabetic retinopathy with macular edema, left eye: Secondary | ICD-10-CM | POA: Diagnosis not present

## 2018-01-04 ENCOUNTER — Ambulatory Visit: Payer: PPO | Admitting: Psychology

## 2018-01-05 DIAGNOSIS — F3189 Other bipolar disorder: Secondary | ICD-10-CM | POA: Diagnosis not present

## 2018-01-06 DIAGNOSIS — E113511 Type 2 diabetes mellitus with proliferative diabetic retinopathy with macular edema, right eye: Secondary | ICD-10-CM | POA: Diagnosis not present

## 2018-01-07 ENCOUNTER — Other Ambulatory Visit: Payer: Self-pay

## 2018-01-07 ENCOUNTER — Encounter: Payer: Self-pay | Admitting: Physician Assistant

## 2018-01-07 ENCOUNTER — Ambulatory Visit (INDEPENDENT_AMBULATORY_CARE_PROVIDER_SITE_OTHER): Payer: PPO | Admitting: Physician Assistant

## 2018-01-07 VITALS — BP 110/70 | HR 96 | Temp 97.8°F | Resp 14 | Ht 61.0 in | Wt 188.0 lb

## 2018-01-07 DIAGNOSIS — Z89432 Acquired absence of left foot: Secondary | ICD-10-CM

## 2018-01-07 DIAGNOSIS — Z23 Encounter for immunization: Secondary | ICD-10-CM | POA: Diagnosis not present

## 2018-01-07 DIAGNOSIS — E1142 Type 2 diabetes mellitus with diabetic polyneuropathy: Secondary | ICD-10-CM | POA: Diagnosis not present

## 2018-01-07 DIAGNOSIS — F3175 Bipolar disorder, in partial remission, most recent episode depressed: Secondary | ICD-10-CM

## 2018-01-07 LAB — LIPID PANEL
CHOL/HDL RATIO: 3
Cholesterol: 140 mg/dL (ref 0–200)
HDL: 46.4 mg/dL (ref >39.00)
LDL Cholesterol: 67 mg/dL (ref 0–99)
NonHDL: 93.7
Triglycerides: 135 mg/dL (ref 0.0–149.0)
VLDL: 27 mg/dL (ref 0.0–40.0)

## 2018-01-07 NOTE — Progress Notes (Signed)
Patient presents to clinic today to discuss her gabapentin dosage and to update [place] on treatment from her Psychiatrist.   Patient is currently on a regimen of Vraylar, recently starting. Notes doing well on this medication thus far without noted side effect. Has follow-up scheduled for next week. Notes her specialist wanted her to follow-up here to see if there was a possibility of lowering her dosage of gabapentin. Patient is currently taking 1200 mg TID of her Gabapentin for her significant diabetic neuropathy. Notes complete resolution of symptoms on this dose. Glycemic control is much better now that she has established with a new endocrinologist. Notes fasting sugars averaging 80s-130s.  Past Medical History:  Diagnosis Date  . Bipolar 1 disorder ([place])   . Cellulitis and abscess of foot 03/08/2015  . Diabetes mellitus    INSULIN DEPENDENT  . Diabetic neuropathy ([place])   . Diabetic neuropathy ([place])   . 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 H/O hiatal hernia   . Hyperlipidemia   . Hypertension    past hx of  . Iron malabsorption 09/26/2015  . Numbness and tingling    Hx; of in B/LLE and B/LUE  . Other iron deficiency anemias 09/20/2015    Current Outpatient Medications on File Prior to Visit  Medication Sig Dispense Refill  . amoxicillin-clavulanate (AUGMENTIN) 500-125 MG tablet Take by mouth. Take 1 tablet by mouth 2 times daily for 14 days    . aspirin 325 MG EC tablet Take 325 mg by mouth daily.    Marland Kitchen atorvastatin (LIPITOR) 20 MG tablet TAKE 1 TABLET BY MOUTH AT BEDTIME 90 tablet 1  . BD VEO INSULIN SYRINGE U/F 31G X 15/64" 1 ML MISC USE AS DIRECTED FIVE TIMES PER DAY  6  . cariprazine (VRAYLAR) capsule Take 1.5 mg by mouth daily.    Marland Kitchen doxycycline (VIBRAMYCIN) 100 MG capsule Take by mouth. Take 1 capsule by mouth 2 times daily for 14 days    . fluticasone (FLOVENT HFA) 110 MCG/ACT inhaler Inhale 1 puff into the lungs 2 (two) times daily. 1 Inhaler 0  . furosemide (LASIX)  20 MG tablet TAKE 1 TABLET BY MOUTH TWICE DAILY 60 tablet 1  . gabapentin (NEURONTIN) 600 MG tablet TAKE 2 TABLETS BY MOUTH THREE TIMES DAILY 540 tablet 0  . glucose blood (FREESTYLE LITE) test strip USE TO CHECK BLOOD SUGAR THREE TIMES DAILY AS DIRECTED 100 each 3  . insulin regular (NOVOLIN R,HUMULIN R) 100 units/mL injection Inject into the skin 3 (three) times daily before meals. TAKE 10 UNITS UNDER THE SKIN IN THE MORNING WITH BREAKFAST, 10 AT LUNCH, 12 AT DINNER    . Lancets (FREESTYLE) lancets Use as instructed to check blood sugar 3 times per day dx code E11.39 100 each 3  . metFORMIN (GLUCOPHAGE-XR) 500 MG 24 hr tablet TAKE 2 TABLETS IN THE MORNING AND 2 TABLETS AT NIGHT. 360 tablet 1  . NOVOLIN N RELION 100 UNIT/ML injection INJECT 30 UNITS SUBCUTANEOUSLY TWICE DAILY FOR 30 DAYS  6  . Ostomy Supplies (SKIN TAC ADHESIVE BARRIER WIPE) MISC 1 each by Does not apply route daily. 100 each 5  . OVER THE COUNTER MEDICATION Take 2 each by mouth daily. Centrum-Muli. Vitamin Gummy    . pantoprazole (PROTONIX) 40 MG tablet TAKE 1 TABLET BY MOUTH ONCE DAILY 90 tablet 1   No current facility-administered medications on file prior to visit.     Allergies  Allergen Reactions  . Keflex [Cephalexin]     Tremors,  rash, hard to breathe  . Lithium Nausea And Vomiting and Other (See Comments)    Other reaction(s): Other (See Comments) Can not keep this medication down. It makes her terribly ill. Can not keep this medication down. It makes her terribly ill.  . Ativan [Lorazepam] Other (See Comments)    Other reaction(s): ANAPHYLAXIS Other reaction(s): Other (See Comments) Abnormal behavior Abnormal behavior  . Demerol [Meperidine] Other (See Comments)    Abnormal behavior (sees things) Other reaction(s): ANAPHYLAXIS Other reaction(s): Other (See Comments) Abnormal behavior Abnormal behavior  . Oxycodone Other (See Comments)    Other reaction(s): OTHER Abnormal behavior  . Codeine Itching and  Other (See Comments)    hallucinations  . Doxycycline Other (See Comments)    Severe muscle tremor  . Darvocet [Propoxyphene N-Acetaminophen] Itching  . Latex Itching    Other reaction(s): OTHER  . Propoxyphene Itching    Family History  Problem Relation Age of Onset  . Diabetes Mother   . Mental illness Mother   . Bipolar disorder Mother   . Hypertension Father   . Diabetes Maternal Grandmother   . Heart disease Neg Hx     Social History   Socioeconomic History  . Marital status: Married    Spouse name: Not on file  . Number of children: 2  . Years of education: 15  . Highest education level: Not on file  Occupational History    Comment: disabled  Social Needs  . Financial resource strain: Not on file  . Food insecurity:    Worry: Not on file    Inability: Not on file  . Transportation needs:    Medical: Not on file    Non-medical: Not on file  Tobacco Use  . Smoking status: Never Smoker  . Smokeless tobacco: Never Used  Substance and Sexual Activity  . Alcohol use: No    Alcohol/week: 0.0 standard drinks  . Drug use: No  . Sexual activity: Never    Partners: Male  Lifestyle  . Physical activity:    Days per week: Not on file    Minutes per session: Not on file  . Stress: Not on file  Relationships  . Social connections:    Talks on phone: Not on file    Gets together: Not on file    Attends religious service: Not on file    Active member of club or organization: Not on file    Attends meetings of clubs or organizations: Not on file    Relationship status: Not on file  Other Topics Concern  . Not on file  Social History Narrative   Lives with husband in home   Caffeine use - Coke 2 or 3 16 oz daily    Review of Systems - See HPI.  All other ROS are negative.  BP 110/70   Pulse 96   Temp 97.8 F (36.6 C) (Oral)   Resp 14   Ht '5\' 1"'  (1.549 m)   Wt 188 lb (85.3 kg)   SpO2 99%   BMI 35.52 kg/m   Physical Exam  Constitutional: She is  oriented to person, place, and time. She appears well-developed and well-nourished.  HENT:  Head: Normocephalic and atraumatic.  Cardiovascular: Normal rate, regular rhythm, normal heart sounds and intact distal pulses.  Pulmonary/Chest: Effort normal and breath sounds normal.  Neurological: She is alert and oriented to person, place, and time.  Psychiatric: She has a normal mood and affect.  Vitals reviewed.  Recent Results (from  the past 2160 hour(s))  Lithium level     Status: None   Collection Time: 10/20/17  3:51 PM  Result Value Ref Range   Lithium Lvl 0.6 0.6 - 1.2 mmol/L  TSH     Status: None   Collection Time: 10/20/17  3:51 PM  Result Value Ref Range   TSH 1.33 0.40 - 4.50 mIU/L  Renal Function Panel     Status: Abnormal   Collection Time: 10/20/17  3:51 PM  Result Value Ref Range   Glucose, Bld 423 (H) 65 - 99 mg/dL    Comment: Verified by repeat analysis. Marland Kitchen .            Fasting reference interval . For someone without known diabetes, a glucose value >125 mg/dL indicates that they may have diabetes and this should be confirmed with a follow-up test. .    BUN 29 (H) 7 - 25 mg/dL   Creat 1.40 (H) 0.50 - 0.99 mg/dL    Comment: For patients >73 years of age, the reference limit for Creatinine is approximately 13% higher for people identified as African-American. .    BUN/Creatinine Ratio 21 6 - 22 (calc)   Sodium 133 (L) 135 - 146 mmol/L   Potassium 5.0 3.5 - 5.3 mmol/L   Chloride 89 (L) 98 - 110 mmol/L   CO2 32 20 - 32 mmol/L   Calcium 9.7 8.6 - 10.4 mg/dL   Phosphorus 4.7 (H) 2.5 - 4.5 mg/dL   Albumin 4.0 3.6 - 5.1 g/dL  Basic metabolic panel     Status: Abnormal   Collection Time: 10/26/17  9:44 AM  Result Value Ref Range   Sodium 139 135 - 145 mEq/L   Potassium 5.1 3.5 - 5.1 mEq/L   Chloride 99 96 - 112 mEq/L   CO2 30 19 - 32 mEq/L   Glucose, Bld 256 (H) 70 - 99 mg/dL   BUN 23 6 - 23 mg/dL   Creatinine, Ser 1.18 0.40 - 1.20 mg/dL   Calcium 10.0 8.4  - 10.5 mg/dL   GFR 49.53 (L) >60.00 mL/min  POCT Glucose (CBG)     Status: Abnormal   Collection Time: 11/30/17 11:35 AM  Result Value Ref Range   POC Glucose 291 (A) 70 - 99 mg/dl  CBC with Differential ([facility])     Status: Abnormal   Collection Time: 12/09/17  9:22 AM  Result Value Ref Range   WBC Count 9.9 3.9 - 10.0 K/uL   RBC 3.57 (L) 3.70 - 5.32 MIL/uL   Hemoglobin 11.5 (L) 11.6 - 15.9 g/dL   HCT 34.6 (L) 34.8 - 46.6 %   MCV 96.9 81.0 - 101.0 fL   MCH 32.2 26.0 - 34.0 pg   MCHC 33.2 32.0 - 36.0 g/dL   RDW 12.2 11.1 - 15.7 %   Platelet Count 273 145 - 400 K/uL   Neutrophils Relative % 75 %   Neutro Abs 7.4 (H) 1.5 - 6.5 K/uL   Lymphocytes Relative 16 %   Lymphs Abs 1.6 0.9 - 3.3 K/uL   Monocytes Relative 7 %   Monocytes Absolute 0.7 0.1 - 0.9 K/uL   Eosinophils Relative 2 %   Eosinophils Absolute 0.2 0.0 - 0.5 K/uL   Basophils Relative 0 %   Basophils Absolute 0.0 0.0 - 0.1 K/uL    Comment: Performed at [facility], [place] [address] [place], [place] [address]  CMP STAT ([place] only)  Status: Abnormal   Collection Time: 12/09/17  9:22 AM  Result Value Ref Range   Sodium 140 135 - 145 mmol/L   Potassium 4.6 3.5 - 5.1 mmol/L   Chloride 103 98 - 111 mmol/L   CO2 30 22 - 32 mmol/L   Glucose, Bld 302 (H) 70 - 99 mg/dL   BUN 22 (H) 6 - 20 mg/dL   Creatinine 1.15 (H) 0.44 - 1.00 mg/dL   Calcium 9.5 8.9 - 10.3 mg/dL   Total Protein 7.0 6.5 - 8.1 g/dL   Albumin 3.6 3.5 - 5.0 g/dL   AST 17 15 - 41 U/L   ALT 22 0 - 44 U/L   Alkaline Phosphatase 96 38 - 126 U/L   Total Bilirubin 0.5 0.3 - 1.2 mg/dL   GFR, Est Non Af Am 51 (L) >60 mL/min   GFR, Est AFR Am 59 (L) >60 mL/min    Comment: (NOTE) The eGFR has been calculated using the CKD EPI equation. This calculation has not been validated in all clinical situations. eGFR's persistently <60 mL/min signify possible Chronic Kidney Disease.    Anion gap 7 5 - 15    Comment:  Performed at [facility], [address], [place], [place] [address]  Reticulocytes     Status: Abnormal   Collection Time: 12/09/17  9:22 AM  Result Value Ref Range   Retic Ct Pct 1.7 0.7 - 2.1 %   RBC. 3.54 (L) 3.70 - 5.45 MIL/uL   Retic Count, Absolute 60.2 33.7 - 90.7 K/uL    Comment: Performed at [facility], [place] [address], [place], [place] [address]  Ferritin     Status: Abnormal   Collection Time: 12/09/17  9:23 AM  Result Value Ref Range   Ferritin 366 (H) 11 - 307 ng/mL    Comment: Performed at [facility], [place] [address], [place], [place] [address]  Iron and TIBC     Status: Abnormal   Collection Time: 12/09/17  9:23 AM  Result Value Ref Range   Iron 38 (L) 41 - 142 ug/dL   TIBC 244 236 - 444 ug/dL   Saturation Ratios 16 (L) 21 - 57 %   UIBC 205 ug/dL    Comment: Performed at [facility], [place] [address] [place], [place] [address]   Assessment/Plan: 1. Diabetic polyneuropathy associated with type 2 diabetes mellitus (HCC) 2. Partial nontraumatic amputation of left foot ([place]) Doing very well. Will decrease Gabapentin to 1200 mg AM, 600 mg Noon and 1200 mg PM. Will then reduce to 600, 600 to 1200. Will monitor symptoms closely.   3. Bipolar disorder, in partial remission, most recent episode depressed ([place]) Doing well thus far on Vraylar. We are decreasing her Gabapentin so that Psychaitry has more room to adjust her psych medications  4. Encounter for immunization Flu vaccine given today. - Flu vaccine HIGH DOSE PF   [name], PA-C

## 2018-01-07 NOTE — Patient Instructions (Addendum)
Decrease Gabapentin to 2 tablets (1200 mg) in the morning, 1 tablet (600 mg) and midday/afternoon dose, and 2 tablets (1200 mg) at bedtime for 3 days. If still noting no increase in neuropathic pain, then decrease to 1 tablet AM, 1 tablet noon and 2 tablets at nighttime. Continue this for 2 weeks. If still controlled we can leave steady or consider further decrease.   Call me in 2 weeks to let me know.

## 2018-01-08 DIAGNOSIS — Z89511 Acquired absence of right leg below knee: Secondary | ICD-10-CM | POA: Diagnosis not present

## 2018-01-12 DIAGNOSIS — L97521 Non-pressure chronic ulcer of other part of left foot limited to breakdown of skin: Secondary | ICD-10-CM | POA: Diagnosis not present

## 2018-01-12 DIAGNOSIS — L97522 Non-pressure chronic ulcer of other part of left foot with fat layer exposed: Secondary | ICD-10-CM | POA: Diagnosis not present

## 2018-01-12 DIAGNOSIS — E11621 Type 2 diabetes mellitus with foot ulcer: Secondary | ICD-10-CM | POA: Diagnosis not present

## 2018-01-13 ENCOUNTER — Ambulatory Visit: Payer: Self-pay | Admitting: Endocrinology

## 2018-01-13 ENCOUNTER — Encounter: Payer: Self-pay | Admitting: Internal Medicine

## 2018-01-13 DIAGNOSIS — F3189 Other bipolar disorder: Secondary | ICD-10-CM | POA: Diagnosis not present

## 2018-01-13 DIAGNOSIS — E113511 Type 2 diabetes mellitus with proliferative diabetic retinopathy with macular edema, right eye: Secondary | ICD-10-CM | POA: Diagnosis not present

## 2018-01-19 ENCOUNTER — Telehealth: Payer: Self-pay | Admitting: Physician Assistant

## 2018-01-19 MED ORDER — FLUTICASONE PROPIONATE 50 MCG/ACT NA SUSP: 2.0000 | Freq: Every day | NASAL | 0 refills | Status: DC

## 2018-01-19 NOTE — Telephone Encounter (Signed)
Last OV 01/07/18, No future OV

## 2018-01-19 NOTE — Telephone Encounter (Signed)
Rx Flonase sent to pharmacy to use as directed.

## 2018-01-19 NOTE — Telephone Encounter (Signed)
Called patient and left a detailed voice message to let her know that her Flonase has been sent to the [facility] for her.

## 2018-01-19 NOTE — Telephone Encounter (Signed)
Pt states that she spoke with Elyn Aquas about getting a nasal spray called in during her last visit and states that the pharmacy has not received anything. Please advise.     Preferred Pharmacy (with phone number or street name):[facility] [address], [place] [phone] (Phone)  [phone] (Fax)

## 2018-01-20 DIAGNOSIS — H16143 Punctate keratitis, bilateral: Secondary | ICD-10-CM | POA: Diagnosis not present

## 2018-01-20 DIAGNOSIS — H5203 Hypermetropia, bilateral: Secondary | ICD-10-CM | POA: Diagnosis not present

## 2018-01-20 DIAGNOSIS — E113413 Type 2 diabetes mellitus with severe nonproliferative diabetic retinopathy with macular edema, bilateral: Secondary | ICD-10-CM | POA: Diagnosis not present

## 2018-01-22 ENCOUNTER — Ambulatory Visit (INDEPENDENT_AMBULATORY_CARE_PROVIDER_SITE_OTHER): Payer: PPO | Admitting: Psychology

## 2018-01-22 ENCOUNTER — Telehealth: Payer: Self-pay | Admitting: Physician Assistant

## 2018-01-22 DIAGNOSIS — F3181 Bipolar II disorder: Secondary | ICD-10-CM | POA: Diagnosis not present

## 2018-01-22 NOTE — Telephone Encounter (Signed)
Called patient and LMOVM to return call  [place] for [facility] to Discuss results / PCP / recommendations / Schedule patient: Would have her make sure she is eating small portion sizes when eating. For example she should consider taking her usual meals and dividing them in half, eating one portion then and another portion a couple of hours later. This cuts down on how much food her stomach has to process at a time while making sure to get adequate nutrients. Try this over the weekend along with continuing the Protonix. Have her let us know if things are not improving.   CRM Created.

## 2018-01-22 NOTE — Telephone Encounter (Signed)
Copied from [place] [id number]. Topic: Quick Communication - See Telephone Encounter >> Jan 22, 2018  8:46 AM Burchel, Abbi R wrote: CRM for notification. See Telephone encounter for: 01/22/18.  pantoprazole (PROTONIX) 40 MG tablet  Pt states that would like to let Einar [place] know that despite this medication she is still having some upset stomach in the evenings and has no appetite. She states that if/when she tries to eat she feels nauseous.  It usually affects her from about 5pm -9/10pm. Please advise pt.   Pt: [phone]

## 2018-01-22 NOTE — Telephone Encounter (Signed)
Would have her make sure she is eating small portion sizes when eating. For example she should consider taking her usual meals and dividing them in half, eating one portion then and another portion a couple of hours later. This cuts down on how much food her stomach has to process at a time while making sure to get adequate nutrients. Try this over the weekend along with continuing the Protonix. Have her let us know if things are not improving.

## 2018-01-22 NOTE — Telephone Encounter (Signed)
Last OV 01/07/18, No future OV

## 2018-01-25 ENCOUNTER — Telehealth: Payer: Self-pay | Admitting: Internal Medicine

## 2018-01-25 NOTE — Telephone Encounter (Signed)
Patient canceled endo colon for this Wednesday 9.18.19 due to her care partner backing out on her. Patient states she will call back to reschedule when her sister can bring her. Do you want to charge fee?

## 2018-01-25 NOTE — Telephone Encounter (Signed)
No charge. 

## 2018-01-26 DIAGNOSIS — L97521 Non-pressure chronic ulcer of other part of left foot limited to breakdown of skin: Secondary | ICD-10-CM | POA: Diagnosis not present

## 2018-01-26 DIAGNOSIS — E11621 Type 2 diabetes mellitus with foot ulcer: Secondary | ICD-10-CM | POA: Diagnosis not present

## 2018-01-26 DIAGNOSIS — L97522 Non-pressure chronic ulcer of other part of left foot with fat layer exposed: Secondary | ICD-10-CM | POA: Diagnosis not present

## 2018-01-27 ENCOUNTER — Encounter: Payer: Self-pay | Admitting: Internal Medicine

## 2018-01-27 DIAGNOSIS — F3189 Other bipolar disorder: Secondary | ICD-10-CM | POA: Diagnosis not present

## 2018-02-05 DIAGNOSIS — K219 Gastro-esophageal reflux disease without esophagitis: Secondary | ICD-10-CM | POA: Diagnosis not present

## 2018-02-05 DIAGNOSIS — G5601 Carpal tunnel syndrome, right upper limb: Secondary | ICD-10-CM | POA: Diagnosis not present

## 2018-02-09 DIAGNOSIS — E7800 Pure hypercholesterolemia, unspecified: Secondary | ICD-10-CM | POA: Diagnosis not present

## 2018-02-09 DIAGNOSIS — E1165 Type 2 diabetes mellitus with hyperglycemia: Secondary | ICD-10-CM | POA: Diagnosis not present

## 2018-02-09 DIAGNOSIS — E118 Type 2 diabetes mellitus with unspecified complications: Secondary | ICD-10-CM | POA: Diagnosis not present

## 2018-02-09 DIAGNOSIS — E1140 Type 2 diabetes mellitus with diabetic neuropathy, unspecified: Secondary | ICD-10-CM | POA: Diagnosis not present

## 2018-02-10 DIAGNOSIS — H16143 Punctate keratitis, bilateral: Secondary | ICD-10-CM | POA: Diagnosis not present

## 2018-02-11 DIAGNOSIS — E113512 Type 2 diabetes mellitus with proliferative diabetic retinopathy with macular edema, left eye: Secondary | ICD-10-CM | POA: Diagnosis not present

## 2018-02-12 DIAGNOSIS — F3189 Other bipolar disorder: Secondary | ICD-10-CM | POA: Diagnosis not present

## 2018-02-18 ENCOUNTER — Ambulatory Visit: Payer: PPO | Admitting: Psychology

## 2018-02-23 DIAGNOSIS — G5601 Carpal tunnel syndrome, right upper limb: Secondary | ICD-10-CM | POA: Diagnosis not present

## 2018-02-24 ENCOUNTER — Other Ambulatory Visit: Payer: Self-pay | Admitting: Physician Assistant

## 2018-02-24 ENCOUNTER — Telehealth: Payer: Self-pay | Admitting: Emergency Medicine

## 2018-02-24 DIAGNOSIS — E875 Hyperkalemia: Secondary | ICD-10-CM

## 2018-02-24 NOTE — Telephone Encounter (Signed)
Please advise of new referral to psychiatry. Unsure of current Psychaitrist  Copied from [place] [id number]. Topic: Referral - Request for Referral >> Feb 24, 2018 10:27 AM Marin Olp L wrote: Has patient seen PCP for this complaint? Yes.   *If NO, is insurance requiring patient see PCP for this issue before PCP can refer them? Referral for which specialty: Psychiatry Preferred provider/office: wants to know who Einar [place] suggest Reason for referral: bipolar, anxiety

## 2018-02-24 NOTE — Telephone Encounter (Signed)
Typically we are not able to refer to psychiatry- we have to provide them w/ possible names/numbers to call.  I like [facility], [facility], and [facility]

## 2018-02-24 NOTE — Telephone Encounter (Signed)
Spoke with patient and advised of checking with Dr [facility] office. Number given to call and set up an appointment [phone]. She states the Psychiatrist in [place] is too far to drive.

## 2018-03-08 DIAGNOSIS — F3189 Other bipolar disorder: Secondary | ICD-10-CM | POA: Diagnosis not present

## 2018-03-12 ENCOUNTER — Other Ambulatory Visit: Payer: Self-pay | Admitting: Physician Assistant

## 2018-03-15 ENCOUNTER — Ambulatory Visit: Payer: PPO | Admitting: Psychology

## 2018-03-29 DIAGNOSIS — F3189 Other bipolar disorder: Secondary | ICD-10-CM | POA: Diagnosis not present

## 2018-04-12 ENCOUNTER — Ambulatory Visit: Payer: PPO | Admitting: Psychology

## 2018-04-12 ENCOUNTER — Other Ambulatory Visit: Payer: Self-pay | Admitting: Physician Assistant

## 2018-04-16 DIAGNOSIS — E113511 Type 2 diabetes mellitus with proliferative diabetic retinopathy with macular edema, right eye: Secondary | ICD-10-CM | POA: Diagnosis not present

## 2018-04-23 ENCOUNTER — Encounter: Payer: Self-pay | Admitting: Physician Assistant

## 2018-04-23 DIAGNOSIS — E113512 Type 2 diabetes mellitus with proliferative diabetic retinopathy with macular edema, left eye: Secondary | ICD-10-CM | POA: Diagnosis not present

## 2018-04-26 DIAGNOSIS — F3189 Other bipolar disorder: Secondary | ICD-10-CM | POA: Diagnosis not present

## 2018-05-10 DIAGNOSIS — F3174 Bipolar disorder, in full remission, most recent episode manic: Secondary | ICD-10-CM | POA: Diagnosis not present

## 2018-05-11 DIAGNOSIS — F3189 Other bipolar disorder: Secondary | ICD-10-CM | POA: Diagnosis not present

## 2018-05-17 DIAGNOSIS — E7800 Pure hypercholesterolemia, unspecified: Secondary | ICD-10-CM | POA: Diagnosis not present

## 2018-05-17 DIAGNOSIS — E1165 Type 2 diabetes mellitus with hyperglycemia: Secondary | ICD-10-CM | POA: Diagnosis not present

## 2018-05-17 DIAGNOSIS — E118 Type 2 diabetes mellitus with unspecified complications: Secondary | ICD-10-CM | POA: Diagnosis not present

## 2018-05-17 DIAGNOSIS — E1140 Type 2 diabetes mellitus with diabetic neuropathy, unspecified: Secondary | ICD-10-CM | POA: Diagnosis not present

## 2018-05-17 DIAGNOSIS — E11319 Type 2 diabetes mellitus with unspecified diabetic retinopathy without macular edema: Secondary | ICD-10-CM | POA: Diagnosis not present

## 2018-05-19 ENCOUNTER — Ambulatory Visit: Payer: PPO | Admitting: Psychology

## 2018-05-20 ENCOUNTER — Other Ambulatory Visit: Payer: Self-pay | Admitting: Physician Assistant

## 2018-05-20 DIAGNOSIS — K219 Gastro-esophageal reflux disease without esophagitis: Secondary | ICD-10-CM

## 2018-05-21 DIAGNOSIS — H01021 Squamous blepharitis right upper eyelid: Secondary | ICD-10-CM | POA: Diagnosis not present

## 2018-05-21 DIAGNOSIS — H01024 Squamous blepharitis left upper eyelid: Secondary | ICD-10-CM | POA: Diagnosis not present

## 2018-05-25 ENCOUNTER — Other Ambulatory Visit: Payer: Self-pay | Admitting: Physician Assistant

## 2018-05-25 DIAGNOSIS — K219 Gastro-esophageal reflux disease without esophagitis: Secondary | ICD-10-CM

## 2018-05-27 DIAGNOSIS — R2689 Other abnormalities of gait and mobility: Secondary | ICD-10-CM | POA: Diagnosis not present

## 2018-05-27 DIAGNOSIS — M21962 Unspecified acquired deformity of left lower leg: Secondary | ICD-10-CM | POA: Diagnosis not present

## 2018-05-27 DIAGNOSIS — E1143 Type 2 diabetes mellitus with diabetic autonomic (poly)neuropathy: Secondary | ICD-10-CM | POA: Diagnosis not present

## 2018-05-27 DIAGNOSIS — L603 Nail dystrophy: Secondary | ICD-10-CM | POA: Diagnosis not present

## 2018-05-27 DIAGNOSIS — L259 Unspecified contact dermatitis, unspecified cause: Secondary | ICD-10-CM | POA: Diagnosis not present

## 2018-05-31 DIAGNOSIS — E113511 Type 2 diabetes mellitus with proliferative diabetic retinopathy with macular edema, right eye: Secondary | ICD-10-CM | POA: Diagnosis not present

## 2018-05-31 DIAGNOSIS — F3174 Bipolar disorder, in full remission, most recent episode manic: Secondary | ICD-10-CM | POA: Diagnosis not present

## 2018-06-02 DIAGNOSIS — L259 Unspecified contact dermatitis, unspecified cause: Secondary | ICD-10-CM | POA: Diagnosis not present

## 2018-06-02 DIAGNOSIS — L603 Nail dystrophy: Secondary | ICD-10-CM | POA: Diagnosis not present

## 2018-06-08 ENCOUNTER — Ambulatory Visit: Payer: PPO | Admitting: Psychology

## 2018-06-14 DIAGNOSIS — G5601 Carpal tunnel syndrome, right upper limb: Secondary | ICD-10-CM | POA: Diagnosis not present

## 2018-06-18 DIAGNOSIS — E113412 Type 2 diabetes mellitus with severe nonproliferative diabetic retinopathy with macular edema, left eye: Secondary | ICD-10-CM | POA: Diagnosis not present

## 2018-06-29 DIAGNOSIS — L309 Dermatitis, unspecified: Secondary | ICD-10-CM | POA: Diagnosis not present

## 2018-06-29 DIAGNOSIS — M21962 Unspecified acquired deformity of left lower leg: Secondary | ICD-10-CM | POA: Diagnosis not present

## 2018-06-29 DIAGNOSIS — E1143 Type 2 diabetes mellitus with diabetic autonomic (poly)neuropathy: Secondary | ICD-10-CM | POA: Diagnosis not present

## 2018-06-29 DIAGNOSIS — R2689 Other abnormalities of gait and mobility: Secondary | ICD-10-CM | POA: Diagnosis not present

## 2018-07-06 DIAGNOSIS — F3174 Bipolar disorder, in full remission, most recent episode manic: Secondary | ICD-10-CM | POA: Diagnosis not present

## 2018-07-08 ENCOUNTER — Other Ambulatory Visit: Payer: Self-pay

## 2018-07-08 MED ORDER — GLUCOSE BLOOD VI STRP: ORAL_STRIP | 3 refills | Status: DC

## 2018-07-09 DIAGNOSIS — E1165 Type 2 diabetes mellitus with hyperglycemia: Secondary | ICD-10-CM | POA: Diagnosis not present

## 2018-07-09 DIAGNOSIS — E113511 Type 2 diabetes mellitus with proliferative diabetic retinopathy with macular edema, right eye: Secondary | ICD-10-CM | POA: Diagnosis not present

## 2018-07-16 ENCOUNTER — Encounter: Payer: Self-pay | Admitting: Family Medicine

## 2018-07-20 ENCOUNTER — Ambulatory Visit (INDEPENDENT_AMBULATORY_CARE_PROVIDER_SITE_OTHER): Payer: PPO | Admitting: Family Medicine

## 2018-07-20 ENCOUNTER — Encounter: Payer: Self-pay | Admitting: Family Medicine

## 2018-07-20 VITALS — BP 117/80 | HR 76 | Temp 97.9°F | Resp 17 | Ht 61.0 in | Wt 187.6 lb

## 2018-07-20 DIAGNOSIS — E875 Hyperkalemia: Secondary | ICD-10-CM | POA: Diagnosis not present

## 2018-07-20 DIAGNOSIS — Z89511 Acquired absence of right leg below knee: Secondary | ICD-10-CM

## 2018-07-20 DIAGNOSIS — K219 Gastro-esophageal reflux disease without esophagitis: Secondary | ICD-10-CM | POA: Diagnosis not present

## 2018-07-20 DIAGNOSIS — Z7689 Persons encountering health services in other specified circumstances: Secondary | ICD-10-CM

## 2018-07-20 DIAGNOSIS — Z1389 Encounter for screening for other disorder: Secondary | ICD-10-CM | POA: Diagnosis not present

## 2018-07-20 DIAGNOSIS — E785 Hyperlipidemia, unspecified: Secondary | ICD-10-CM | POA: Diagnosis not present

## 2018-07-20 DIAGNOSIS — Z1231 Encounter for screening mammogram for malignant neoplasm of breast: Secondary | ICD-10-CM | POA: Diagnosis not present

## 2018-07-20 DIAGNOSIS — F319 Bipolar disorder, unspecified: Secondary | ICD-10-CM | POA: Diagnosis not present

## 2018-07-20 DIAGNOSIS — E1142 Type 2 diabetes mellitus with diabetic polyneuropathy: Secondary | ICD-10-CM

## 2018-07-20 LAB — POCT URINALYSIS DIP (CLINITEK)
BILIRUBIN UA: NEGATIVE
GLUCOSE UA: NEGATIVE mg/dL
Ketones, POC UA: NEGATIVE mg/dL
NITRITE UA: NEGATIVE
PH UA: 7 (ref 5.0–8.0)
POC PROTEIN,UA: NEGATIVE
RBC UA: NEGATIVE
Spec Grav, UA: 1.015 (ref 1.010–1.025)
UROBILINOGEN UA: 1 U/dL

## 2018-07-20 MED ORDER — ATORVASTATIN CALCIUM 20 MG PO TABS: 20.0000 mg | ORAL_TABLET | Freq: Every day | ORAL | 1 refills | Status: DC

## 2018-07-20 MED ORDER — NOVOLIN N RELION 100 UNIT/ML ~~LOC~~ SUSP: SUBCUTANEOUS | 6 refills | Status: AC

## 2018-07-20 MED ORDER — GLUCOSE BLOOD VI STRP: ORAL_STRIP | 3 refills | Status: AC

## 2018-07-20 MED ORDER — GABAPENTIN 600 MG PO TABS: 1200.0000 mg | ORAL_TABLET | Freq: Three times a day (TID) | ORAL | 0 refills | Status: DC

## 2018-07-20 MED ORDER — FREESTYLE LANCETS MISC: 3 refills | Status: AC

## 2018-07-20 MED ORDER — PANTOPRAZOLE SODIUM 40 MG PO TBEC: 40.0000 mg | DELAYED_RELEASE_TABLET | Freq: Every day | ORAL | 0 refills | Status: DC

## 2018-07-20 MED ORDER — "BD VEO INSULIN SYRINGE U/F 31G X 15/64"" 1 ML MISC": 6 refills | Status: AC

## 2018-07-20 MED ORDER — INSULIN REGULAR HUMAN 100 UNIT/ML IJ SOLN: INTRAMUSCULAR | 3 refills | Status: DC

## 2018-07-20 NOTE — Patient Instructions (Addendum)
Thank you for choosing Primary Care at [facility] to be your medical home!    Dana [name] was seen by Molli Barrows, FNP today.   Charlann Lange Gustafson's primary care provider is Scot Jun, FNP.   For the best care possible, you should try to see Molli Barrows, FNP-C whenever you come to the clinic.   We look forward to seeing you again soon!  If you have any questions about your visit today, please call us at [phone] or feel free to reach your primary care provider via [place].     I have refilled all requested medication with the exception of furosemide (Lasix).  Will evaluate renal function prior to re-prescribing furosemide.  We will follow-up with you by phone regarding lab results.  To schedule your breast exam, please call [facility] Phone: [phone]     Carbohydrate Counting for Diabetes Mellitus, Adult  Carbohydrate counting is a method of keeping track of how many carbohydrates you eat. Eating carbohydrates naturally increases the amount of sugar (glucose) in the blood. Counting how many carbohydrates you eat helps keep your blood glucose within normal limits, which helps you manage your diabetes (diabetes mellitus). It is important to know how many carbohydrates you can safely have in each meal. This is different for every person. A diet and nutrition specialist (registered dietitian) can help you make a meal plan and calculate how many carbohydrates you should have at each meal and snack. Carbohydrates are found in the following foods:  Grains, such as breads and cereals.  Dried beans and soy products.  Starchy vegetables, such as potatoes, peas, and corn.  Fruit and fruit juices.  Milk and yogurt.  Sweets and snack foods, such as cake, cookies, candy, chips, and soft drinks. How do I count carbohydrates? There are two ways to count carbohydrates in food. You can use either of the methods or a combination of both. Reading "Nutrition  Facts" on packaged food The "Nutrition Facts" list is included on the labels of almost all packaged foods and beverages in the [place] It includes:  The serving size.  Information about nutrients in each serving, including the grams (g) of carbohydrate per serving. To use the "Nutrition Facts":  Decide how many servings you will have.  Multiply the number of servings by the number of carbohydrates per serving.  The resulting number is the total amount of carbohydrates that you will be having. Learning standard serving sizes of other foods When you eat carbohydrate foods that are not packaged or do not include "Nutrition Facts" on the label, you need to measure the servings in order to count the amount of carbohydrates:  Measure the foods that you will eat with a food scale or measuring cup, if needed.  Decide how many standard-size servings you will eat.  Multiply the number of servings by 15. Most carbohydrate-rich foods have about 15 g of carbohydrates per serving. ? For example, if you eat 8 oz (170 g) of strawberries, you will have eaten 2 servings and 30 g of carbohydrates (2 servings x 15 g = 30 g).  For foods that have more than one food mixed, such as soups and casseroles, you must count the carbohydrates in each food that is included. The following list contains standard serving sizes of common carbohydrate-rich foods. Each of these servings has about 15 g of carbohydrates:   hamburger bun or  English muffin.   oz (15 mL) syrup.   oz (14  g) jelly.  1 slice of bread.  1 six-inch tortilla.  3 oz (85 g) cooked rice or pasta.  4 oz (113 g) cooked dried beans.  4 oz (113 g) starchy vegetable, such as peas, corn, or potatoes.  4 oz (113 g) hot cereal.  4 oz (113 g) mashed potatoes or  of a large baked potato.  4 oz (113 g) canned or frozen fruit.  4 oz (120 mL) fruit juice.  4-6 crackers.  6 chicken nuggets.  6 oz (170 g) unsweetened dry cereal.  6 oz (170  g) plain fat-free yogurt or yogurt sweetened with artificial sweeteners.  8 oz (240 mL) milk.  8 oz (170 g) fresh fruit or one small piece of fruit.  24 oz (680 g) popped popcorn. Example of carbohydrate counting Sample meal  3 oz (85 g) chicken breast.  6 oz (170 g) brown rice.  4 oz (113 g) corn.  8 oz (240 mL) milk.  8 oz (170 g) strawberries with sugar-free whipped topping. Carbohydrate calculation 1. Identify the foods that contain carbohydrates: ? Rice. ? Corn. ? Milk. ? Strawberries. 2. Calculate how many servings you have of each food: ? 2 servings rice. ? 1 serving corn. ? 1 serving milk. ? 1 serving strawberries. 3. Multiply each number of servings by 15 g: ? 2 servings rice x 15 g = 30 g. ? 1 serving corn x 15 g = 15 g. ? 1 serving milk x 15 g = 15 g. ? 1 serving strawberries x 15 g = 15 g. 4. Add together all of the amounts to find the total grams of carbohydrates eaten: ? 30 g + 15 g + 15 g + 15 g = 75 g of carbohydrates total. Summary  Carbohydrate counting is a method of keeping track of how many carbohydrates you eat.  Eating carbohydrates naturally increases the amount of sugar (glucose) in the blood.  Counting how many carbohydrates you eat helps keep your blood glucose within normal limits, which helps you manage your diabetes.  A diet and nutrition specialist (registered dietitian) can help you make a meal plan and calculate how many carbohydrates you should have at each meal and snack. This information is not intended to replace advice given to you by your health care provider. Make sure you discuss any questions you have with your health care provider. Document Released: 04/28/2005 Document Revised: 11/05/2016 Document Reviewed: 10/10/2015 Elsevier Interactive Patient Education  2019 [employer].

## 2018-07-20 NOTE — Progress Notes (Signed)
Dana [name], is a 63 y.o. female  OYD:[id number]  EHM:[id number]  DOB - 01/25/1956  CC:  Chief Complaint  Patient presents with  . Establish Care  . Diabetes    CBG 136  . Hyperlipidemia       HPI: Ashling is a 63 y.o. female is here today to establish care with PCP and chronic condition management.  Care team includes:  Orthopedic surgery-Dr. Cameron Proud @[facility]  Ortho-[facility]  Gastroenterology- Dr. Scarlette Shorts  Endocrinology- Dr. Ajay Kumar  Dana [name] has Type 2 diabetes mellitus with diabetic polyneuropathy, with long-term current use of insulin ([place]); Bipolar 1 disorder ([place]); DM (diabetes mellitus) type II uncontrolled with eye manifestation ([place]); Hyperlipidemia LDL goal <100; Diabetic polyneuropathy associated with type 2 diabetes mellitus ([place]); Partial nontraumatic amputation of foot ([place]); Diabetic retinopathy associated with diabetes mellitus due to underlying condition ([place]); Other iron deficiency anemia; Erythropoietin deficiency anemia; Iron malabsorption; Anemia associated with diabetes mellitus ([place]); Hiatal hernia; Allergic rhinitis; Gastroesophageal reflux disease without esophagitis; Contracture of left Achilles tendon; Pleural effusion; Partial nontraumatic amputation of left foot ([place]); and Acquired absence of right leg below knee ([place]) on their problem list.   Dana [name] is S/P leg below the knee amputation associated with circulatory complication due to uncontrolled type 2 diabetes.  She is currently able to ambulate with a prosthesis device.  She complains of chronic leg pain which improves with gabapentin.  She is on a very high dose of gabapentin 600 mg 3 times a day.  Ports this allows the pain to remain tolerable.  Previously followed by endocrinology for management of diabetes however has not been seen since 11/30/2017. Last A1C 9 months prior 10.8.  Insulin-dependent with both a long-acting and a short acting insulin as well as metformin. Reports of recent  blood sugars has mostly been < 180. She has not used sliding scale fasting insulin within the last few months. Reports she has drastically cut back on carbohydrates such as sweets, breads, and rice. She feels diabetes is much better than it has been in the past.Currently on statin. Not prescribed ACE/ARB.    Current active diagnosis of Bipolar 1 disorder. She is followed by [facility]. She is seen by psychiatrist and counselor . Next follow-up end of this month. Denies suicidal ideations, homicidal ideations, or auditory hallucinations. Denies any illicit drug use or alcohol.   Needs medication refills. She is requesting a refill of Furosemide. Reports she has taken for years, although uncertain of what specifically medication was prescribed to treat.   Patient denies new headaches, chest pain, abdominal pain, nausea, new weakness , SOB, edema, or worrisome cough.   Current medications: Current Outpatient Medications:  .  aspirin 325 MG EC tablet, Take 325 mg by mouth daily., Disp: , Rfl:  .  atorvastatin (LIPITOR) 20 MG tablet, TAKE 1 TABLET BY MOUTH AT BEDTIME, Disp: 90 tablet, Rfl: 1 .  BD VEO INSULIN SYRINGE U/F 31G X 15/64" 1 ML MISC, USE AS DIRECTED FIVE TIMES PER DAY, Disp: , Rfl: 6 .  busPIRone (BUSPAR) 5 MG tablet, Take 1 tablet by mouth 3 (three) times daily., Disp: , Rfl:  .  fluticasone (FLONASE) 50 MCG/ACT nasal spray, USE 2 SPRAY(S) IN EACH NOSTRIL ONCE DAILY, Disp: 16 g, Rfl: 3 .  furosemide (LASIX) 20 MG tablet, TAKE 1 TABLET BY MOUTH TWICE DAILY, Disp: 180 tablet, Rfl: 1 .  gabapentin (NEURONTIN) 600 MG tablet, TAKE 2 TABLETS BY MOUTH THREE TIMES DAILY, Disp: 540 tablet, Rfl:  0 .  glucose blood (FREESTYLE LITE) test strip, USE AS INSTRUCTED TO CHECK BLOOD SUGAR 3 TIMES DAILY. DX:E11.65, Disp: 100 each, Rfl: 3 .  insulin regular (NOVOLIN R,HUMULIN R) 100 units/mL injection, Inject into the skin 3 (three) times daily before meals. TAKE 10 UNITS UNDER THE SKIN IN  THE MORNING WITH BREAKFAST, 10 AT LUNCH, 12 AT DINNER, Disp: , Rfl:  .  Lancets (FREESTYLE) lancets, Use as instructed to check blood sugar 3 times per day dx code E11.39, Disp: 100 each, Rfl: 3 .  metFORMIN (GLUCOPHAGE-XR) 500 MG 24 hr tablet, TAKE 2 TABLETS IN THE MORNING AND 2 TABLETS AT NIGHT., Disp: 360 tablet, Rfl: 1 .  NOVOLIN N RELION 100 UNIT/ML injection, INJECT 30 UNITS SUBCUTANEOUSLY TWICE DAILY FOR 30 DAYS, Disp: , Rfl: 6 .  Ostomy Supplies (SKIN TAC ADHESIVE BARRIER WIPE) MISC, 1 each by Does not apply route daily., Disp: 100 each, Rfl: 5 .  pantoprazole (PROTONIX) 40 MG tablet, TAKE 1 TABLET BY MOUTH ONCE DAILY, Disp: 90 tablet, Rfl: 0 .  VRAYLAR capsule, Take 3 mg by mouth daily., Disp: , Rfl: 0   Pertinent family medical history: family history includes Bipolar disorder in her mother; Diabetes in her maternal grandmother, mother, and sister; Early death in her brother; Healthy in her sister; Heart disease in her father; Hypertension in her father; Mental illness in her mother; Osteoarthritis in her father.    Social History   Socioeconomic History  . Marital status: Married    Spouse name: Not on file  . Number of children: 2  . Years of education: 34  . Highest education level: Not on file  Occupational History    Comment: disabled  Social Needs  . Financial resource strain: Not on file  . Food insecurity:    Worry: Not on file    Inability: Not on file  . Transportation needs:    Medical: Not on file    Non-medical: Not on file  Tobacco Use  . Smoking status: Never Smoker  . Smokeless tobacco: Never Used  Substance and Sexual Activity  . Alcohol use: No    Alcohol/week: 0.0 standard drinks  . Drug use: No  . Sexual activity: Never    Partners: Male  Lifestyle  . Physical activity:    Days per week: Not on file    Minutes per session: Not on file  . Stress: Not on file  Relationships  . Social connections:    Talks on phone: Not on file    Gets  together: Not on file    Attends religious service: Not on file    Active member of club or organization: Not on file    Attends meetings of clubs or organizations: Not on file    Relationship status: Not on file  . Intimate partner violence:    Fear of current or ex partner: Not on file    Emotionally abused: Not on file    Physically abused: Not on file    Forced sexual activity: Not on file  Other Topics Concern  . Not on file  Social History Narrative   Lives with husband in home   Caffeine use - Coke 2 or 3 16 oz daily    Review of Systems: Pertinent negatives listed in HPI Objective:   Vitals:   07/20/18 1500  BP: 117/80  Pulse: 76  Resp: 17  Temp: 97.9 F (36.6 C)  SpO2: 98%    BP Readings from Last 3  Encounters:  07/20/18 117/80  01/07/18 110/70  12/14/17 (!) 142/71    Filed Weights   07/20/18 1500  Weight: 187 lb 9.6 oz (85.1 kg)      Physical Exam: Constitutional: Patient appears well-developed and well-nourished. No distress. HENT: Normocephalic, atraumatic, External right and left ear normal. Oropharynx is clear and moist.  Eyes: Conjunctivae and EOM are normal. PERRLA, no scleral icterus. Neck: Normal ROM. Neck supple. No JVD. No tracheal deviation. No thyromegaly. CVS: RRR, S1/S2 +, no murmurs, no gallops, no carotid bruit.  Pulmonary: Effort and breath sounds normal, no stridor, rhonchi, wheezes, rales.  Abdominal: Soft. BS +, no distension, tenderness, rebound or guarding.  Musculoskeletal: Normal range of motion. No edema and no tenderness.  Neuro: Alert. Normal muscle tone coordination. Normal gait.  Skin: Skin is warm and dry. No rash noted. Not diaphoretic. No erythema. No pallor. Psychiatric: Normal mood and affect. Behavior, judgment, thought content normal.  Lab Results (prior encounters)  Lab Results  Component Value Date   WBC 9.9 12/09/2017   HGB 11.5 (L) 12/09/2017   HCT 34.6 (L) 12/09/2017   MCV 96.9 12/09/2017   PLT 273  12/09/2017   Lab Results  Component Value Date   CREATININE 1.15 (H) 12/09/2017   BUN 22 (H) 12/09/2017   NA 140 12/09/2017   K 4.6 12/09/2017   CL 103 12/09/2017   CO2 30 12/09/2017    Lab Results  Component Value Date   HGBA1C 10.8 (A) 09/29/2017       Component Value Date/Time   CHOL 140 01/07/2018 1033   TRIG 135.0 01/07/2018 1033   HDL 46.40 01/07/2018 1033   CHOLHDL 3 01/07/2018 1033   VLDL 27.0 01/07/2018 1033   LDLCALC 67 01/07/2018 1033        Assessment and plan:  1. Encounter to establish care  2. Screening for blood or protein in urine - POCT URINALYSIS DIP (CLINITEK), remarkable  3. Diabetic polyneuropathy associated with type 2 diabetes mellitus (HCC) -We will continue current regimen of 60 units of Novolin in the morning and 15 units at bedtime.  Patient denies any recent hypoglycemic episodes.  She is prescribed short acting insulin and reports a sliding scale that she recalls by memory.  Patient was given a handout of a rapid acting sliding scale to instruct when necessary to administer short acting insulin. The scale was reviewed. Patient verbalized understanding. Checking A1C.   4. Acquired absence of right leg below knee (HCC) -You gabapentin for phantom leg pain.  5. Bipolar 1 disorder ([place]) -Stable today.  [facility]  6. Gastroesophageal reflux disease without esophagitis -Refilled Protonix  7. Hyperkalemia -CMP  8. Hyperlipidemia LDL goal <100 -Statin therapy  Patient has been long prescribed furosemide.  Patient had abnormal renal function on last labs.  Will repeat CMP today and if warranted will continue furosemide however on as-needed basis.    The patient was given clear instructions to go to ER or return to medical center if symptoms don't improve, worsen or new problems develop. The patient verbalized understanding. The patient was advised  to call and obtain lab results if they haven't heard  anything from out office within 7-10 business days.  Molli Barrows, FNP Primary Care at [facility] [address] [place] [address] [phone]: [phone]    This note has been created with Dragon speech recognition software and Engineer, materials. Any transcriptional errors are unintentional.

## 2018-07-21 ENCOUNTER — Telehealth: Payer: Self-pay | Admitting: Family Medicine

## 2018-07-21 LAB — COMPREHENSIVE METABOLIC PANEL
ALT: 25 IU/L (ref 0–32)
AST: 17 IU/L (ref 0–40)
Albumin/Globulin Ratio: 1.8 (ref 1.2–2.2)
Albumin: 4.2 g/dL (ref 3.8–4.8)
Alkaline Phosphatase: 106 IU/L (ref 39–117)
BUN/Creatinine Ratio: 21 (ref 12–28)
BUN: 27 mg/dL (ref 8–27)
Bilirubin Total: 0.4 mg/dL (ref 0.0–1.2)
CALCIUM: 9.5 mg/dL (ref 8.7–10.3)
CO2: 26 mmol/L (ref 20–29)
Chloride: 101 mmol/L (ref 96–106)
Creatinine, Ser: 1.28 mg/dL — ABNORMAL HIGH (ref 0.57–1.00)
GFR calc Af Amer: 52 mL/min/{1.73_m2} — ABNORMAL LOW (ref >59)
GFR, EST NON AFRICAN AMERICAN: 45 mL/min/{1.73_m2} — AB (ref >59)
Globulin, Total: 2.3 g/dL (ref 1.5–4.5)
Glucose: 104 mg/dL — ABNORMAL HIGH (ref 65–99)
Potassium: 4.9 mmol/L (ref 3.5–5.2)
Sodium: 146 mmol/L — ABNORMAL HIGH (ref 134–144)
Total Protein: 6.5 g/dL (ref 6.0–8.5)

## 2018-07-21 LAB — MICROALBUMIN / CREATININE URINE RATIO
Creatinine, Urine: 27.1 mg/dL
Microalb/Creat Ratio: 72 mg/g creat — ABNORMAL HIGH (ref 0–29)
Microalbumin, Urine: 19.5 ug/mL

## 2018-07-21 LAB — HEMOGLOBIN A1C
Est. average glucose Bld gHb Est-mCnc: 209 mg/dL
Hgb A1c MFr Bld: 8.9 % — ABNORMAL HIGH (ref 4.8–5.6)

## 2018-07-21 LAB — GLUCOSE, POCT (MANUAL RESULT ENTRY): POC Glucose: 136 mg/dl — AB (ref 70–99)

## 2018-07-21 NOTE — Telephone Encounter (Signed)
A1C is currently 8.9 which has improved from 10.8.  Goal A1C <7.0. Continue with revised sliding scale and continue with current long-acting insulin dose. Renal function decreased compared to 7 months ago. I will continue to hold lasix until we recheck your kidney function in at your follow-up

## 2018-07-23 DIAGNOSIS — E1142 Type 2 diabetes mellitus with diabetic polyneuropathy: Secondary | ICD-10-CM | POA: Diagnosis not present

## 2018-07-23 DIAGNOSIS — E113512 Type 2 diabetes mellitus with proliferative diabetic retinopathy with macular edema, left eye: Secondary | ICD-10-CM | POA: Diagnosis not present

## 2018-07-23 DIAGNOSIS — M79641 Pain in right hand: Secondary | ICD-10-CM | POA: Diagnosis not present

## 2018-07-23 DIAGNOSIS — G5601 Carpal tunnel syndrome, right upper limb: Secondary | ICD-10-CM | POA: Diagnosis not present

## 2018-07-23 NOTE — Telephone Encounter (Signed)
Patient notified of lab results & recommendations. Expressed understanding.

## 2018-07-26 ENCOUNTER — Telehealth: Payer: Self-pay | Admitting: Family Medicine

## 2018-07-26 MED ORDER — FLUTICASONE PROPIONATE 50 MCG/ACT NA SUSP: NASAL | 3 refills | Status: DC

## 2018-07-26 NOTE — Telephone Encounter (Signed)
This was discussed with patient during her visit, if renal function was reduced will stop lasix. During the office patient could not tell the reason she was taking the medication who prescribed the medication.  Recommend continuing to hold Lasix until her next follow-up. Her hand swelling is likely related to carpal tunnel, elevated and reduce sodium. Will repeat renal function at follow-up.

## 2018-07-26 NOTE — Telephone Encounter (Signed)
Caller Name: Naly [place]ique  Reason for Call:  Medication refill request  fluticasone (FLONASE) 50 MCG/ACT nasal spray [[id number]] furosemide (LASIX) 20 MG tablet [[id number]]   PCP has never filled this medication before.  If this is a medication request: confirm pharmacy  [facility] on [address] in [place] call back number:  [phone]  Action taken by recipient of request:

## 2018-07-26 NOTE — Telephone Encounter (Signed)
Pt name and DOB verified. Pt aware of message.  She states the lasix helps her wit hand swelling.  Advised patient that swelling could also be r/t decrease kidney function.   Advised that a message about her hand swelling will be sent to her provider. We would call her back and f/u with her.   Pt verbalized understanding of not taking Lasix.

## 2018-07-27 NOTE — Telephone Encounter (Signed)
Reiterated the following information with patient.

## 2018-08-04 ENCOUNTER — Telehealth: Payer: Self-pay | Admitting: Family Medicine

## 2018-08-04 NOTE — Telephone Encounter (Signed)
Please advise 

## 2018-08-04 NOTE — Telephone Encounter (Signed)
Patient called to inform PCP that since she stopped lasix her leg has swelled up to twice the size it normally is, patient would like to know if she should start lasix again. Please follow up.

## 2018-08-04 NOTE — Telephone Encounter (Signed)
Advise patient that she needs to go to the emergency department if she is having leg swelling as this is a new problem. Previously she reported lasix was only for hand swelling. The leg swelling is new problem which she needs evaluation.

## 2018-08-04 NOTE — Telephone Encounter (Signed)
Patient notified of this information. States that she will call somebody to take her to be evaluated.

## 2018-08-12 ENCOUNTER — Other Ambulatory Visit: Payer: Self-pay | Admitting: Physician Assistant

## 2018-08-12 DIAGNOSIS — E875 Hyperkalemia: Secondary | ICD-10-CM

## 2018-08-13 ENCOUNTER — Other Ambulatory Visit: Payer: Self-pay | Admitting: Physician Assistant

## 2018-08-13 DIAGNOSIS — E875 Hyperkalemia: Secondary | ICD-10-CM

## 2018-08-16 ENCOUNTER — Other Ambulatory Visit: Payer: Self-pay | Admitting: Physician Assistant

## 2018-08-16 DIAGNOSIS — E875 Hyperkalemia: Secondary | ICD-10-CM

## 2018-08-17 ENCOUNTER — Telehealth: Payer: Self-pay | Admitting: Family Medicine

## 2018-08-17 NOTE — Telephone Encounter (Signed)
I am unable to accept at this time.

## 2018-08-17 NOTE — Telephone Encounter (Addendum)
Pt called to request refill again.  Reiterated the following information to pt, and advised pt to get in touch with Molli Barrows, FNP.

## 2018-08-17 NOTE — Telephone Encounter (Signed)
Copied from [facility] [id number]. Topic: Quick Communication - See Telephone Encounter >> Aug 17, 2018 11:14 AM [place]chel, Abbi R wrote: CRM for notification. See Telephone encounter for: 08/17/18.  Pt requesting TOC appt w/ Elyn Aquas. Please advise.    [phone]  **Please see TE: 07/26/2018**

## 2018-08-17 NOTE — Telephone Encounter (Signed)
Spoke to pt advising that Dana [name] was unable to do TOC at this time.

## 2018-08-23 DIAGNOSIS — G2581 Restless legs syndrome: Secondary | ICD-10-CM | POA: Diagnosis not present

## 2018-08-23 DIAGNOSIS — Z79899 Other long term (current) drug therapy: Secondary | ICD-10-CM | POA: Diagnosis not present

## 2018-08-23 DIAGNOSIS — R600 Localized edema: Secondary | ICD-10-CM | POA: Diagnosis not present

## 2018-08-30 DIAGNOSIS — R600 Localized edema: Secondary | ICD-10-CM | POA: Diagnosis not present

## 2018-08-30 DIAGNOSIS — G2581 Restless legs syndrome: Secondary | ICD-10-CM | POA: Diagnosis not present

## 2018-08-30 DIAGNOSIS — L309 Dermatitis, unspecified: Secondary | ICD-10-CM | POA: Diagnosis not present

## 2018-09-02 DIAGNOSIS — G5601 Carpal tunnel syndrome, right upper limb: Secondary | ICD-10-CM | POA: Diagnosis not present

## 2018-09-02 DIAGNOSIS — M5412 Radiculopathy, cervical region: Secondary | ICD-10-CM | POA: Diagnosis not present

## 2018-09-03 DIAGNOSIS — E113512 Type 2 diabetes mellitus with proliferative diabetic retinopathy with macular edema, left eye: Secondary | ICD-10-CM | POA: Diagnosis not present

## 2018-09-07 DIAGNOSIS — M542 Cervicalgia: Secondary | ICD-10-CM | POA: Diagnosis not present

## 2018-09-07 DIAGNOSIS — M5412 Radiculopathy, cervical region: Secondary | ICD-10-CM | POA: Diagnosis not present

## 2018-09-17 DIAGNOSIS — Z89511 Acquired absence of right leg below knee: Secondary | ICD-10-CM | POA: Diagnosis not present

## 2018-09-17 DIAGNOSIS — E113511 Type 2 diabetes mellitus with proliferative diabetic retinopathy with macular edema, right eye: Secondary | ICD-10-CM | POA: Diagnosis not present

## 2018-10-05 DIAGNOSIS — M5412 Radiculopathy, cervical region: Secondary | ICD-10-CM | POA: Diagnosis not present

## 2018-10-05 DIAGNOSIS — M47812 Spondylosis without myelopathy or radiculopathy, cervical region: Secondary | ICD-10-CM | POA: Diagnosis not present

## 2018-10-05 DIAGNOSIS — M50322 Other cervical disc degeneration at C5-C6 level: Secondary | ICD-10-CM | POA: Diagnosis not present

## 2018-10-05 DIAGNOSIS — M50323 Other cervical disc degeneration at C6-C7 level: Secondary | ICD-10-CM | POA: Diagnosis not present

## 2018-10-06 DIAGNOSIS — L300 Nummular dermatitis: Secondary | ICD-10-CM | POA: Diagnosis not present

## 2018-10-06 DIAGNOSIS — L219 Seborrheic dermatitis, unspecified: Secondary | ICD-10-CM | POA: Diagnosis not present

## 2018-10-06 DIAGNOSIS — L301 Dyshidrosis [pompholyx]: Secondary | ICD-10-CM | POA: Diagnosis not present

## 2018-10-06 DIAGNOSIS — L299 Pruritus, unspecified: Secondary | ICD-10-CM | POA: Diagnosis not present

## 2018-10-07 DIAGNOSIS — G5601 Carpal tunnel syndrome, right upper limb: Secondary | ICD-10-CM | POA: Diagnosis not present

## 2018-10-07 DIAGNOSIS — M5412 Radiculopathy, cervical region: Secondary | ICD-10-CM | POA: Diagnosis not present

## 2018-10-12 DIAGNOSIS — E1165 Type 2 diabetes mellitus with hyperglycemia: Secondary | ICD-10-CM | POA: Diagnosis not present

## 2018-10-15 DIAGNOSIS — E113412 Type 2 diabetes mellitus with severe nonproliferative diabetic retinopathy with macular edema, left eye: Secondary | ICD-10-CM | POA: Diagnosis not present

## 2018-10-18 ENCOUNTER — Telehealth: Payer: Self-pay

## 2018-10-18 NOTE — Telephone Encounter (Signed)
Called patient to do their pre-visit COVID screening.  Have you recently traveled internationally([place], [place], [place], [place], [place]) or within the [place] to a hotspot area([place], [place], [place], [place], [place])? no  Are you currently experiencing any of the following: fever, cough, SHOB, fatigue, body aches, loss of smell, rash, diarrhea, vomiting, severe headaches, weakness, sore throat? no  Have you been in contact with anyone who has recently travelled? no  Have you been in contact with anyone who is experiencing any of the above symptoms or been diagnosed with [place]  or works in or has recently visited a SNF? No  Patient states that she doesn't have transportation to the office tomorrow & will need to reschedule appointment.

## 2018-10-19 ENCOUNTER — Ambulatory Visit: Payer: Self-pay | Admitting: Family Medicine

## 2018-10-20 DIAGNOSIS — E113511 Type 2 diabetes mellitus with proliferative diabetic retinopathy with macular edema, right eye: Secondary | ICD-10-CM | POA: Diagnosis not present

## 2018-10-27 DIAGNOSIS — M6702 Short Achilles tendon (acquired), left ankle: Secondary | ICD-10-CM | POA: Diagnosis not present

## 2018-10-27 DIAGNOSIS — Z79899 Other long term (current) drug therapy: Secondary | ICD-10-CM | POA: Diagnosis not present

## 2018-10-27 DIAGNOSIS — E119 Type 2 diabetes mellitus without complications: Secondary | ICD-10-CM | POA: Diagnosis not present

## 2018-10-27 DIAGNOSIS — E11610 Type 2 diabetes mellitus with diabetic neuropathic arthropathy: Secondary | ICD-10-CM | POA: Diagnosis not present

## 2018-10-27 DIAGNOSIS — L97522 Non-pressure chronic ulcer of other part of left foot with fat layer exposed: Secondary | ICD-10-CM | POA: Diagnosis not present

## 2018-10-27 DIAGNOSIS — Z89511 Acquired absence of right leg below knee: Secondary | ICD-10-CM | POA: Diagnosis not present

## 2018-10-27 DIAGNOSIS — R5381 Other malaise: Secondary | ICD-10-CM | POA: Diagnosis not present

## 2018-10-27 DIAGNOSIS — Z7951 Long term (current) use of inhaled steroids: Secondary | ICD-10-CM | POA: Diagnosis not present

## 2018-10-27 DIAGNOSIS — E860 Dehydration: Secondary | ICD-10-CM | POA: Diagnosis not present

## 2018-10-27 DIAGNOSIS — L7634 Postprocedural seroma of skin and subcutaneous tissue following other procedure: Secondary | ICD-10-CM | POA: Diagnosis not present

## 2018-10-27 DIAGNOSIS — E1165 Type 2 diabetes mellitus with hyperglycemia: Secondary | ICD-10-CM | POA: Diagnosis not present

## 2018-10-27 DIAGNOSIS — R609 Edema, unspecified: Secondary | ICD-10-CM | POA: Diagnosis not present

## 2018-10-27 DIAGNOSIS — R7989 Other specified abnormal findings of blood chemistry: Secondary | ICD-10-CM | POA: Diagnosis not present

## 2018-10-27 DIAGNOSIS — E1140 Type 2 diabetes mellitus with diabetic neuropathy, unspecified: Secondary | ICD-10-CM | POA: Diagnosis not present

## 2018-10-27 DIAGNOSIS — M79604 Pain in right leg: Secondary | ICD-10-CM | POA: Diagnosis not present

## 2018-10-27 DIAGNOSIS — R509 Fever, unspecified: Secondary | ICD-10-CM | POA: Diagnosis not present

## 2018-10-27 DIAGNOSIS — E7800 Pure hypercholesterolemia, unspecified: Secondary | ICD-10-CM | POA: Diagnosis not present

## 2018-10-27 DIAGNOSIS — Z9119 Patient's noncompliance with other medical treatment and regimen: Secondary | ICD-10-CM | POA: Diagnosis not present

## 2018-10-27 DIAGNOSIS — E876 Hypokalemia: Secondary | ICD-10-CM | POA: Diagnosis not present

## 2018-10-27 DIAGNOSIS — R52 Pain, unspecified: Secondary | ICD-10-CM | POA: Diagnosis not present

## 2018-10-27 DIAGNOSIS — Z8614 Personal history of Methicillin resistant Staphylococcus aureus infection: Secondary | ICD-10-CM | POA: Diagnosis not present

## 2018-10-27 DIAGNOSIS — K449 Diaphragmatic hernia without obstruction or gangrene: Secondary | ICD-10-CM | POA: Diagnosis not present

## 2018-10-27 DIAGNOSIS — Z90710 Acquired absence of both cervix and uterus: Secondary | ICD-10-CM | POA: Diagnosis not present

## 2018-10-27 DIAGNOSIS — N179 Acute kidney failure, unspecified: Secondary | ICD-10-CM | POA: Diagnosis not present

## 2018-10-27 DIAGNOSIS — M5412 Radiculopathy, cervical region: Secondary | ICD-10-CM | POA: Diagnosis not present

## 2018-10-27 DIAGNOSIS — Z833 Family history of diabetes mellitus: Secondary | ICD-10-CM | POA: Diagnosis not present

## 2018-10-27 DIAGNOSIS — Z89432 Acquired absence of left foot: Secondary | ICD-10-CM | POA: Diagnosis not present

## 2018-10-27 DIAGNOSIS — Z96651 Presence of right artificial knee joint: Secondary | ICD-10-CM | POA: Diagnosis not present

## 2018-10-27 DIAGNOSIS — T8743 Infection of amputation stump, right lower extremity: Secondary | ICD-10-CM | POA: Diagnosis not present

## 2018-10-27 DIAGNOSIS — E1142 Type 2 diabetes mellitus with diabetic polyneuropathy: Secondary | ICD-10-CM | POA: Diagnosis not present

## 2018-10-27 DIAGNOSIS — J45909 Unspecified asthma, uncomplicated: Secondary | ICD-10-CM | POA: Diagnosis not present

## 2018-10-27 DIAGNOSIS — L03115 Cellulitis of right lower limb: Secondary | ICD-10-CM | POA: Diagnosis not present

## 2018-10-27 DIAGNOSIS — Z794 Long term (current) use of insulin: Secondary | ICD-10-CM | POA: Diagnosis not present

## 2018-10-27 DIAGNOSIS — E11621 Type 2 diabetes mellitus with foot ulcer: Secondary | ICD-10-CM | POA: Diagnosis not present

## 2018-10-27 DIAGNOSIS — E11319 Type 2 diabetes mellitus with unspecified diabetic retinopathy without macular edema: Secondary | ICD-10-CM | POA: Diagnosis not present

## 2018-10-27 DIAGNOSIS — T8789 Other complications of amputation stump: Secondary | ICD-10-CM | POA: Diagnosis not present

## 2018-10-27 DIAGNOSIS — M79661 Pain in right lower leg: Secondary | ICD-10-CM | POA: Diagnosis not present

## 2018-10-27 DIAGNOSIS — M7989 Other specified soft tissue disorders: Secondary | ICD-10-CM | POA: Diagnosis not present

## 2018-12-08 ENCOUNTER — Other Ambulatory Visit: Payer: Self-pay

## 2018-12-08 ENCOUNTER — Ambulatory Visit: Payer: Self-pay | Admitting: Hematology & Oncology

## 2018-12-10 DIAGNOSIS — E1140 Type 2 diabetes mellitus with diabetic neuropathy, unspecified: Secondary | ICD-10-CM | POA: Diagnosis not present

## 2018-12-10 DIAGNOSIS — E11319 Type 2 diabetes mellitus with unspecified diabetic retinopathy without macular edema: Secondary | ICD-10-CM | POA: Diagnosis not present

## 2018-12-10 DIAGNOSIS — E118 Type 2 diabetes mellitus with unspecified complications: Secondary | ICD-10-CM | POA: Diagnosis not present

## 2018-12-10 DIAGNOSIS — R600 Localized edema: Secondary | ICD-10-CM | POA: Diagnosis not present

## 2018-12-10 DIAGNOSIS — E1165 Type 2 diabetes mellitus with hyperglycemia: Secondary | ICD-10-CM | POA: Diagnosis not present

## 2018-12-10 DIAGNOSIS — E7800 Pure hypercholesterolemia, unspecified: Secondary | ICD-10-CM | POA: Diagnosis not present

## 2018-12-10 DIAGNOSIS — G2581 Restless legs syndrome: Secondary | ICD-10-CM | POA: Diagnosis not present

## 2018-12-10 DIAGNOSIS — E113511 Type 2 diabetes mellitus with proliferative diabetic retinopathy with macular edema, right eye: Secondary | ICD-10-CM | POA: Diagnosis not present

## 2018-12-24 DIAGNOSIS — E113412 Type 2 diabetes mellitus with severe nonproliferative diabetic retinopathy with macular edema, left eye: Secondary | ICD-10-CM | POA: Diagnosis not present

## 2019-01-10 ENCOUNTER — Encounter: Payer: Self-pay | Admitting: Hematology & Oncology

## 2019-01-10 ENCOUNTER — Other Ambulatory Visit: Payer: Self-pay

## 2019-01-10 ENCOUNTER — Inpatient Hospital Stay (HOSPITAL_BASED_OUTPATIENT_CLINIC_OR_DEPARTMENT_OTHER): Payer: PPO | Admitting: Hematology & Oncology

## 2019-01-10 ENCOUNTER — Inpatient Hospital Stay: Payer: PPO | Attending: Hematology & Oncology

## 2019-01-10 ENCOUNTER — Telehealth: Payer: Self-pay | Admitting: Hematology & Oncology

## 2019-01-10 ENCOUNTER — Other Ambulatory Visit: Payer: Self-pay | Admitting: *Deleted

## 2019-01-10 VITALS — BP 127/72 | HR 88 | Temp 97.0°F | Resp 20 | Wt 214.4 lb

## 2019-01-10 DIAGNOSIS — E1165 Type 2 diabetes mellitus with hyperglycemia: Secondary | ICD-10-CM | POA: Diagnosis not present

## 2019-01-10 DIAGNOSIS — Z79899 Other long term (current) drug therapy: Secondary | ICD-10-CM | POA: Insufficient documentation

## 2019-01-10 DIAGNOSIS — D508 Other iron deficiency anemias: Secondary | ICD-10-CM

## 2019-01-10 DIAGNOSIS — D509 Iron deficiency anemia, unspecified: Secondary | ICD-10-CM | POA: Insufficient documentation

## 2019-01-10 LAB — CMP (CANCER CENTER ONLY)
ALT: 26 U/L (ref 0–44)
AST: 16 U/L (ref 15–41)
Albumin: 3.8 g/dL (ref 3.5–5.0)
Alkaline Phosphatase: 106 U/L (ref 38–126)
Anion gap: 12 (ref 5–15)
BUN: 24 mg/dL — ABNORMAL HIGH (ref 8–23)
CO2: 31 mmol/L (ref 22–32)
Calcium: 9.6 mg/dL (ref 8.9–10.3)
Chloride: 100 mmol/L (ref 98–111)
Creatinine: 1.49 mg/dL — ABNORMAL HIGH (ref 0.44–1.00)
GFR, Est AFR Am: 43 mL/min — ABNORMAL LOW (ref >60)
GFR, Estimated: 38 mL/min — ABNORMAL LOW (ref >60)
Glucose, Bld: 195 mg/dL — ABNORMAL HIGH (ref 70–99)
Potassium: 3.6 mmol/L (ref 3.5–5.1)
Sodium: 143 mmol/L (ref 135–145)
Total Bilirubin: 0.5 mg/dL (ref 0.3–1.2)
Total Protein: 6.8 g/dL (ref 6.5–8.1)

## 2019-01-10 LAB — RETIC PANEL
Immature Retic Fract: 16 % — ABNORMAL HIGH (ref 2.3–15.9)
RBC.: 3.5 MIL/uL — ABNORMAL LOW (ref 3.87–5.11)
Retic Count, Absolute: 113.1 10*3/uL (ref 19.0–186.0)
Retic Ct Pct: 3.2 % — ABNORMAL HIGH (ref 0.4–3.1)
Reticulocyte Hemoglobin: 35.8 pg (ref >27.9)

## 2019-01-10 LAB — CBC WITH DIFFERENTIAL (CANCER CENTER ONLY)
Abs Immature Granulocytes: 0.19 K/uL — ABNORMAL HIGH (ref 0.00–0.07)
Basophils Absolute: 0.1 K/uL (ref 0.0–0.1)
Basophils Relative: 1 %
Eosinophils Absolute: 0.3 K/uL (ref 0.0–0.5)
Eosinophils Relative: 4 %
HCT: 34.7 % — ABNORMAL LOW (ref 36.0–46.0)
Hemoglobin: 11.4 g/dL — ABNORMAL LOW (ref 12.0–15.0)
Immature Granulocytes: 2 %
Lymphocytes Relative: 28 %
Lymphs Abs: 2.6 K/uL (ref 0.7–4.0)
MCH: 31.9 pg (ref 26.0–34.0)
MCHC: 32.9 g/dL (ref 30.0–36.0)
MCV: 97.2 fL (ref 80.0–100.0)
Monocytes Absolute: 0.6 K/uL (ref 0.1–1.0)
Monocytes Relative: 7 %
Neutro Abs: 5.5 K/uL (ref 1.7–7.7)
Neutrophils Relative %: 58 %
Platelet Count: 272 K/uL (ref 150–400)
RBC: 3.57 MIL/uL — ABNORMAL LOW (ref 3.87–5.11)
RDW: 13.3 % (ref 11.5–15.5)
WBC Count: 9.3 K/uL (ref 4.0–10.5)
nRBC: 0 % (ref 0.0–0.2)

## 2019-01-10 NOTE — Progress Notes (Signed)
Hematology and Oncology Follow Up Visit  Dana [name] [id number] [birth year] 63 y.o. 01/10/2019   Principle Diagnosis:  Iron deficiency anemia Poorly controlled diabetes type II  Current Therapy:   IV iron as indicated - last received in August 2019    Interim History:  Dana [name] is here today for a follow-up.  We see her back yearly.  She now has the prosthesis for her right BKA.  She is doing pretty well with this.  We last saw her a year ago, her iron studies were a little bit low.  Her iron saturation was only 16%.  She got a dose of Feraheme on 12/14/2017.  She has had no bleeding.  She is had no fever.  She has had no cough.  There is been no problems with nausea or vomiting.  She has had some leg swelling with the left leg.  She has had no headache.  She had a little bit of a rash on her arms.  It was excised and may be some actinic keratoses.  Her blood sugars seem to be doing a little bit better.  I know that diabetes has been a problem for her in the past.  Overall, I would say performance status is ECOG 1.   Medications:  Allergies as of 01/10/2019      Reactions   Keflex [cephalexin]    Tremors, rash, hard to breathe   Lithium Nausea And Vomiting, Other (See Comments)   Other reaction(s): Other (See Comments) Can not keep this medication down. It makes her terribly ill. Can not keep this medication down. It makes her terribly ill.   Ativan [lorazepam] Other (See Comments)   Other reaction(s): ANAPHYLAXIS Other reaction(s): Other (See Comments) Abnormal behavior Abnormal behavior   Demerol [meperidine] Other (See Comments)   Abnormal behavior (sees things) Other reaction(s): ANAPHYLAXIS Other reaction(s): Other (See Comments) Abnormal behavior Abnormal behavior   Oxycodone Other (See Comments)   Other reaction(s): OTHER Abnormal behavior   Codeine Itching, Other (See Comments)   hallucinations   Doxycycline Other (See Comments)   Severe muscle tremor   Darvocet [propoxyphene N-acetaminophen] Itching   Latex Itching   Other reaction(s): OTHER   Propoxyphene Itching      Medication List       Accurate as of January 10, 2019 12:03 PM. If you have any questions, ask your nurse or doctor.        aspirin 325 MG EC tablet Take 325 mg by mouth daily.   atorvastatin 20 MG tablet Commonly known as: LIPITOR Take 1 tablet (20 mg total) by mouth at bedtime.   BD Veo Insulin Syringe U/F 31G X 15/64" 1 ML Misc Generic drug: Insulin Syringe-Needle U-100 Administer insulin up 3 times per day per sliding scale E.11.9   busPIRone 5 MG tablet Commonly known as: BUSPAR Take 1 tablet by mouth 3 (three) times daily.   fluticasone 50 MCG/ACT nasal spray Commonly known as: FLONASE USE 2 SPRAY(S) IN EACH NOSTRIL ONCE DAILY   freestyle lancets Use as instructed to check blood sugar 3 times per day dx code E11.39   furosemide 20 MG tablet Commonly known as: LASIX TAKE 1 TABLET BY MOUTH TWICE DAILY   gabapentin 600 MG tablet Commonly known as: NEURONTIN Take 2 tablets (1,200 mg total) by mouth 3 (three) times daily.   glucose blood test strip Commonly known as: FREESTYLE LITE USE AS INSTRUCTED TO CHECK BLOOD SUGAR 3 TIMES DAILY. DX:E11.65   insulin regular 100 units/mL  injection Commonly known as: NOVOLIN R Administer per sliding scale provided   Iron 325 (65 Fe) MG Tabs Take by mouth daily.   metFORMIN 500 MG 24 hr tablet Commonly known as: GLUCOPHAGE-XR TAKE 2 TABLETS IN THE MORNING AND 2 TABLETS AT NIGHT. What changed:   how much to take  additional instructions   NovoLIN N ReliOn 100 UNIT/ML injection Generic drug: insulin NPH Human INJECT 60 UNITS AM and 50 units PM SUBCUTANEOUSLY TWICE DAILY FOR 30 DAYS What changed: additional instructions   pantoprazole 40 MG tablet Commonly known as: PROTONIX Take 1 tablet (40 mg total) by mouth daily.   QUEtiapine 50 MG tablet Commonly known as: SEROQUEL Take by mouth.   Skin  Tac Adhesive Barrier Wipe Misc 1 each by Does not apply route daily.   Vraylar capsule Generic drug: cariprazine Take 3 mg by mouth daily.       Allergies:  Allergies  Allergen Reactions  . Keflex [Cephalexin]     Tremors, rash, hard to breathe  . Lithium Nausea And Vomiting and Other (See Comments)    Other reaction(s): Other (See Comments) Can not keep this medication down. It makes her terribly ill. Can not keep this medication down. It makes her terribly ill.  . Ativan [Lorazepam] Other (See Comments)    Other reaction(s): ANAPHYLAXIS Other reaction(s): Other (See Comments) Abnormal behavior Abnormal behavior  . Demerol [Meperidine] Other (See Comments)    Abnormal behavior (sees things) Other reaction(s): ANAPHYLAXIS Other reaction(s): Other (See Comments) Abnormal behavior Abnormal behavior  . Oxycodone Other (See Comments)    Other reaction(s): OTHER Abnormal behavior  . Codeine Itching and Other (See Comments)    hallucinations  . Doxycycline Other (See Comments)    Severe muscle tremor  . Darvocet [Propoxyphene N-Acetaminophen] Itching  . Latex Itching    Other reaction(s): OTHER  . Propoxyphene Itching    Past Medical History, Surgical history, Social history, and Family History were reviewed and updated.  Review of Systems: All other 10 point review of systems is negative.   Physical Exam:  weight is 214 lb 6.4 oz (97.3 kg). Her temporal temperature is 97 F (36.1 C) (abnormal). Her blood pressure is 127/72 and her pulse is 88. Her respiration is 20 and oxygen saturation is 97%.   Wt Readings from Last 3 Encounters:  01/10/19 214 lb 6.4 oz (97.3 kg)  07/20/18 187 lb 9.6 oz (85.1 kg)  01/07/18 188 lb (85.3 kg)    Fairly well-developed and well-nourished white female. Head and neck exam shows no ocular or oral lesions. She has no scleral icterus. There is no adenopathy in the neck. Lungs are clear bilaterally. Cardiac exam regular rate and rhythm  with no murmurs, rubs or bruits. Abdomen is soft. She is mildly obese. She has good bowel sounds. There is no fluid wave. There is no palpable liver or spleen tip. Extremities shows the right BKA. She has a walking brace on her left lower leg. Neurological exam shows no focal neurological deficits. Skin exam shows some scattered ecchymoses.   Lab Results  Component Value Date   WBC 9.3 01/10/2019   HGB 11.4 (L) 01/10/2019   HCT 34.7 (L) 01/10/2019   MCV 97.2 01/10/2019   PLT 272 01/10/2019   Lab Results  Component Value Date   FERRITIN 366 (H) 12/09/2017   IRON 38 (L) 12/09/2017   TIBC 244 12/09/2017   UIBC 205 12/09/2017   IRONPCTSAT 16 (L) 12/09/2017   Lab Results  Component  Value Date   RETICCTPCT 3.2 (H) 01/10/2019   RBC 3.57 (L) 01/10/2019   RBC 3.50 (L) 01/10/2019   No results found for: KPAFRELGTCHN, LAMBDASER, KAPLAMBRATIO No results found for: IGGSERUM, IGA, IGMSERUM No results found for: Odetta Pink, SPEI   Chemistry      Component Value Date/Time   NA 143 01/10/2019 1025   NA 146 (H) 07/20/2018 1601   NA 140 12/18/2016 1156   K 3.6 01/10/2019 1025   K 4.9 12/18/2016 1156   CL 100 01/10/2019 1025   CL 104 09/18/2016 1052   CO2 31 01/10/2019 1025   CO2 28 12/18/2016 1156   BUN 24 (H) 01/10/2019 1025   BUN 27 07/20/2018 1601   BUN 19.9 12/18/2016 1156   CREATININE 1.49 (H) 01/10/2019 1025   CREATININE 1.40 (H) 10/20/2017 1551   CREATININE 0.8 12/18/2016 1156      Component Value Date/Time   CALCIUM 9.6 01/10/2019 1025   CALCIUM 9.8 12/18/2016 1156   ALKPHOS 106 01/10/2019 1025   ALKPHOS 213 (H) 12/18/2016 1156   AST 16 01/10/2019 1025   AST 14 12/18/2016 1156   ALT 26 01/10/2019 1025   ALT 19 12/18/2016 1156   BILITOT 0.5 01/10/2019 1025   BILITOT 0.39 12/18/2016 1156     Impression and Plan: Dana [name] is a very pleasant 63 yo white female with history of iron deficiency anemia and  erythropoietin deficiency secondary to uncontrolled diabetes.  We will see what studies show.  Hopefully, her iron studies are not all that bad.  She did get iron a year ago.  I am glad her diabetes is doing better.  I will see her back in another year.     Volanda Napoleon, MD 8/31/202012:03 PM

## 2019-01-10 NOTE — Telephone Encounter (Signed)
lmom to inform patient of one year follow up 01/10/20 at 1030 am per 8/31 LOS

## 2019-01-11 ENCOUNTER — Telehealth: Payer: Self-pay | Admitting: *Deleted

## 2019-01-11 LAB — FERRITIN: Ferritin: 397 ng/mL — ABNORMAL HIGH (ref 11–307)

## 2019-01-11 LAB — IRON AND TIBC
Iron: 71 ug/dL (ref 41–142)
Saturation Ratios: 30 % (ref 21–57)
TIBC: 238 ug/dL (ref 236–444)
UIBC: 166 ug/dL (ref 120–384)

## 2019-01-11 NOTE — Telephone Encounter (Signed)
Patient notified per order of Dr. Ennever that "the iron level is ok!!"  Patient appreciative of call and has no questions or concerns at this time.   

## 2019-01-11 NOTE — Telephone Encounter (Signed)
-----   Message from Volanda Napoleon, MD sent at 01/11/2019 11:52 AM EDT ----- Call - the iron level is ok!!  Laurey Arrow

## 2019-01-13 DIAGNOSIS — E113511 Type 2 diabetes mellitus with proliferative diabetic retinopathy with macular edema, right eye: Secondary | ICD-10-CM | POA: Diagnosis not present

## 2019-01-19 DIAGNOSIS — H16143 Punctate keratitis, bilateral: Secondary | ICD-10-CM | POA: Diagnosis not present

## 2019-01-19 DIAGNOSIS — E113413 Type 2 diabetes mellitus with severe nonproliferative diabetic retinopathy with macular edema, bilateral: Secondary | ICD-10-CM | POA: Diagnosis not present

## 2019-01-19 DIAGNOSIS — H04123 Dry eye syndrome of bilateral lacrimal glands: Secondary | ICD-10-CM | POA: Diagnosis not present

## 2019-02-07 DIAGNOSIS — E1165 Type 2 diabetes mellitus with hyperglycemia: Secondary | ICD-10-CM | POA: Diagnosis not present

## 2019-02-07 DIAGNOSIS — L97422 Non-pressure chronic ulcer of left heel and midfoot with fat layer exposed: Secondary | ICD-10-CM | POA: Diagnosis not present

## 2019-02-07 DIAGNOSIS — L89620 Pressure ulcer of left heel, unstageable: Secondary | ICD-10-CM | POA: Diagnosis not present

## 2019-02-07 DIAGNOSIS — E11610 Type 2 diabetes mellitus with diabetic neuropathic arthropathy: Secondary | ICD-10-CM | POA: Diagnosis not present

## 2019-02-07 DIAGNOSIS — E1142 Type 2 diabetes mellitus with diabetic polyneuropathy: Secondary | ICD-10-CM | POA: Diagnosis not present

## 2019-02-07 DIAGNOSIS — E11621 Type 2 diabetes mellitus with foot ulcer: Secondary | ICD-10-CM | POA: Diagnosis not present

## 2019-02-14 DIAGNOSIS — E11621 Type 2 diabetes mellitus with foot ulcer: Secondary | ICD-10-CM | POA: Diagnosis not present

## 2019-02-14 DIAGNOSIS — L97429 Non-pressure chronic ulcer of left heel and midfoot with unspecified severity: Secondary | ICD-10-CM | POA: Diagnosis not present

## 2019-02-14 DIAGNOSIS — L89620 Pressure ulcer of left heel, unstageable: Secondary | ICD-10-CM | POA: Diagnosis not present

## 2019-02-14 DIAGNOSIS — E11610 Type 2 diabetes mellitus with diabetic neuropathic arthropathy: Secondary | ICD-10-CM | POA: Diagnosis not present

## 2019-02-14 DIAGNOSIS — E1142 Type 2 diabetes mellitus with diabetic polyneuropathy: Secondary | ICD-10-CM | POA: Diagnosis not present

## 2019-02-14 DIAGNOSIS — M19072 Primary osteoarthritis, left ankle and foot: Secondary | ICD-10-CM | POA: Diagnosis not present

## 2019-02-21 DIAGNOSIS — E1165 Type 2 diabetes mellitus with hyperglycemia: Secondary | ICD-10-CM | POA: Diagnosis not present

## 2019-02-21 DIAGNOSIS — L89620 Pressure ulcer of left heel, unstageable: Secondary | ICD-10-CM | POA: Diagnosis not present

## 2019-02-21 DIAGNOSIS — L97429 Non-pressure chronic ulcer of left heel and midfoot with unspecified severity: Secondary | ICD-10-CM | POA: Diagnosis not present

## 2019-02-21 DIAGNOSIS — L97422 Non-pressure chronic ulcer of left heel and midfoot with fat layer exposed: Secondary | ICD-10-CM | POA: Diagnosis not present

## 2019-02-21 DIAGNOSIS — E1142 Type 2 diabetes mellitus with diabetic polyneuropathy: Secondary | ICD-10-CM | POA: Diagnosis not present

## 2019-02-21 DIAGNOSIS — E11610 Type 2 diabetes mellitus with diabetic neuropathic arthropathy: Secondary | ICD-10-CM | POA: Diagnosis not present

## 2019-02-21 DIAGNOSIS — E11621 Type 2 diabetes mellitus with foot ulcer: Secondary | ICD-10-CM | POA: Diagnosis not present

## 2019-02-21 DIAGNOSIS — L97424 Non-pressure chronic ulcer of left heel and midfoot with necrosis of bone: Secondary | ICD-10-CM | POA: Diagnosis not present

## 2019-02-21 DIAGNOSIS — Z794 Long term (current) use of insulin: Secondary | ICD-10-CM | POA: Diagnosis not present

## 2019-03-01 DIAGNOSIS — L97422 Non-pressure chronic ulcer of left heel and midfoot with fat layer exposed: Secondary | ICD-10-CM | POA: Diagnosis not present

## 2019-03-01 DIAGNOSIS — E11621 Type 2 diabetes mellitus with foot ulcer: Secondary | ICD-10-CM | POA: Diagnosis not present

## 2019-03-01 DIAGNOSIS — L89620 Pressure ulcer of left heel, unstageable: Secondary | ICD-10-CM | POA: Diagnosis not present

## 2019-03-01 DIAGNOSIS — L97423 Non-pressure chronic ulcer of left heel and midfoot with necrosis of muscle: Secondary | ICD-10-CM | POA: Diagnosis not present

## 2019-03-01 DIAGNOSIS — E1165 Type 2 diabetes mellitus with hyperglycemia: Secondary | ICD-10-CM | POA: Diagnosis not present

## 2019-03-01 DIAGNOSIS — E11610 Type 2 diabetes mellitus with diabetic neuropathic arthropathy: Secondary | ICD-10-CM | POA: Diagnosis not present

## 2019-03-01 DIAGNOSIS — E1142 Type 2 diabetes mellitus with diabetic polyneuropathy: Secondary | ICD-10-CM | POA: Diagnosis not present

## 2019-03-02 ENCOUNTER — Other Ambulatory Visit: Payer: Self-pay | Admitting: Physician Assistant

## 2019-03-04 DIAGNOSIS — H16143 Punctate keratitis, bilateral: Secondary | ICD-10-CM | POA: Diagnosis not present

## 2019-03-04 DIAGNOSIS — E113413 Type 2 diabetes mellitus with severe nonproliferative diabetic retinopathy with macular edema, bilateral: Secondary | ICD-10-CM | POA: Diagnosis not present

## 2019-03-04 DIAGNOSIS — H04123 Dry eye syndrome of bilateral lacrimal glands: Secondary | ICD-10-CM | POA: Diagnosis not present

## 2019-03-07 DIAGNOSIS — L97429 Non-pressure chronic ulcer of left heel and midfoot with unspecified severity: Secondary | ICD-10-CM | POA: Diagnosis not present

## 2019-03-07 DIAGNOSIS — L89620 Pressure ulcer of left heel, unstageable: Secondary | ICD-10-CM | POA: Diagnosis not present

## 2019-03-07 DIAGNOSIS — E11621 Type 2 diabetes mellitus with foot ulcer: Secondary | ICD-10-CM | POA: Diagnosis not present

## 2019-03-07 DIAGNOSIS — E11610 Type 2 diabetes mellitus with diabetic neuropathic arthropathy: Secondary | ICD-10-CM | POA: Diagnosis not present

## 2019-03-07 DIAGNOSIS — L97422 Non-pressure chronic ulcer of left heel and midfoot with fat layer exposed: Secondary | ICD-10-CM | POA: Diagnosis not present

## 2019-03-07 DIAGNOSIS — E1142 Type 2 diabetes mellitus with diabetic polyneuropathy: Secondary | ICD-10-CM | POA: Diagnosis not present

## 2019-03-07 DIAGNOSIS — E1165 Type 2 diabetes mellitus with hyperglycemia: Secondary | ICD-10-CM | POA: Diagnosis not present

## 2019-03-07 DIAGNOSIS — L97423 Non-pressure chronic ulcer of left heel and midfoot with necrosis of muscle: Secondary | ICD-10-CM | POA: Diagnosis not present

## 2019-03-10 ENCOUNTER — Other Ambulatory Visit: Payer: Self-pay

## 2019-03-10 NOTE — Patient Outreach (Addendum)
[facility]) Care Management  03/10/2019  Dana [name] [birth year] [id number]   [facility] to assist patient with transportation resources.   Unsuccessful outreach to patient today.  Left voicemail message.  Mailed Unsuccessful Outreach Letter.  Will attempt to reach again within four business days.   Addendum: Received return call from patient.  Talked with patient about [facility] as she reports that she resides in [place], not [place]. Patient stated that she prefers not to use this transportation service because it is time consuming and "unreliable".  Informed patient that this is only Building [employer] other than hiring private transportation agency.  Patient verbalized understanding and stated that she will use this service if family is unable to assist.  BSW closing case.  CSW, Nat Christen, remains involved at this time.  Ronn Melena, BSW Social Worker [phone]

## 2019-03-11 ENCOUNTER — Ambulatory Visit: Payer: PPO

## 2019-03-15 DIAGNOSIS — L89620 Pressure ulcer of left heel, unstageable: Secondary | ICD-10-CM | POA: Diagnosis not present

## 2019-03-15 DIAGNOSIS — E113511 Type 2 diabetes mellitus with proliferative diabetic retinopathy with macular edema, right eye: Secondary | ICD-10-CM | POA: Diagnosis not present

## 2019-03-15 DIAGNOSIS — L97422 Non-pressure chronic ulcer of left heel and midfoot with fat layer exposed: Secondary | ICD-10-CM | POA: Diagnosis not present

## 2019-03-15 DIAGNOSIS — E11621 Type 2 diabetes mellitus with foot ulcer: Secondary | ICD-10-CM | POA: Diagnosis not present

## 2019-03-15 DIAGNOSIS — E1142 Type 2 diabetes mellitus with diabetic polyneuropathy: Secondary | ICD-10-CM | POA: Diagnosis not present

## 2019-03-15 DIAGNOSIS — E11610 Type 2 diabetes mellitus with diabetic neuropathic arthropathy: Secondary | ICD-10-CM | POA: Diagnosis not present

## 2019-03-15 DIAGNOSIS — E1165 Type 2 diabetes mellitus with hyperglycemia: Secondary | ICD-10-CM | POA: Diagnosis not present

## 2019-03-15 DIAGNOSIS — L97423 Non-pressure chronic ulcer of left heel and midfoot with necrosis of muscle: Secondary | ICD-10-CM | POA: Diagnosis not present

## 2019-03-17 ENCOUNTER — Ambulatory Visit: Payer: PPO | Admitting: *Deleted

## 2019-03-21 DIAGNOSIS — Z9111 Patient's noncompliance with dietary regimen: Secondary | ICD-10-CM | POA: Diagnosis not present

## 2019-03-21 DIAGNOSIS — E11621 Type 2 diabetes mellitus with foot ulcer: Secondary | ICD-10-CM | POA: Diagnosis not present

## 2019-03-21 DIAGNOSIS — L97429 Non-pressure chronic ulcer of left heel and midfoot with unspecified severity: Secondary | ICD-10-CM | POA: Diagnosis not present

## 2019-03-21 DIAGNOSIS — L97422 Non-pressure chronic ulcer of left heel and midfoot with fat layer exposed: Secondary | ICD-10-CM | POA: Diagnosis not present

## 2019-03-21 DIAGNOSIS — L97423 Non-pressure chronic ulcer of left heel and midfoot with necrosis of muscle: Secondary | ICD-10-CM | POA: Diagnosis not present

## 2019-03-21 DIAGNOSIS — L89620 Pressure ulcer of left heel, unstageable: Secondary | ICD-10-CM | POA: Diagnosis not present

## 2019-03-21 DIAGNOSIS — E1142 Type 2 diabetes mellitus with diabetic polyneuropathy: Secondary | ICD-10-CM | POA: Diagnosis not present

## 2019-03-21 DIAGNOSIS — E1165 Type 2 diabetes mellitus with hyperglycemia: Secondary | ICD-10-CM | POA: Diagnosis not present

## 2019-03-21 DIAGNOSIS — E11610 Type 2 diabetes mellitus with diabetic neuropathic arthropathy: Secondary | ICD-10-CM | POA: Diagnosis not present

## 2019-03-23 DIAGNOSIS — E1165 Type 2 diabetes mellitus with hyperglycemia: Secondary | ICD-10-CM | POA: Diagnosis not present

## 2019-03-23 DIAGNOSIS — G2581 Restless legs syndrome: Secondary | ICD-10-CM | POA: Diagnosis not present

## 2019-03-23 DIAGNOSIS — Z23 Encounter for immunization: Secondary | ICD-10-CM | POA: Diagnosis not present

## 2019-03-23 DIAGNOSIS — Z794 Long term (current) use of insulin: Secondary | ICD-10-CM | POA: Diagnosis not present

## 2019-03-23 DIAGNOSIS — Z0000 Encounter for general adult medical examination without abnormal findings: Secondary | ICD-10-CM | POA: Diagnosis not present

## 2019-03-23 DIAGNOSIS — E118 Type 2 diabetes mellitus with unspecified complications: Secondary | ICD-10-CM | POA: Diagnosis not present

## 2019-03-23 DIAGNOSIS — E1122 Type 2 diabetes mellitus with diabetic chronic kidney disease: Secondary | ICD-10-CM | POA: Diagnosis not present

## 2019-03-23 DIAGNOSIS — F319 Bipolar disorder, unspecified: Secondary | ICD-10-CM | POA: Diagnosis not present

## 2019-03-23 DIAGNOSIS — R0982 Postnasal drip: Secondary | ICD-10-CM | POA: Diagnosis not present

## 2019-03-23 DIAGNOSIS — S91329A Laceration with foreign body, unspecified foot, initial encounter: Secondary | ICD-10-CM | POA: Diagnosis not present

## 2019-03-23 DIAGNOSIS — E1140 Type 2 diabetes mellitus with diabetic neuropathy, unspecified: Secondary | ICD-10-CM | POA: Diagnosis not present

## 2019-03-25 DIAGNOSIS — E113512 Type 2 diabetes mellitus with proliferative diabetic retinopathy with macular edema, left eye: Secondary | ICD-10-CM | POA: Diagnosis not present

## 2019-04-01 DIAGNOSIS — E1165 Type 2 diabetes mellitus with hyperglycemia: Secondary | ICD-10-CM | POA: Diagnosis not present

## 2019-04-04 DIAGNOSIS — Z9119 Patient's noncompliance with other medical treatment and regimen: Secondary | ICD-10-CM | POA: Diagnosis not present

## 2019-04-04 DIAGNOSIS — E11621 Type 2 diabetes mellitus with foot ulcer: Secondary | ICD-10-CM | POA: Diagnosis not present

## 2019-04-04 DIAGNOSIS — Z6841 Body Mass Index (BMI) 40.0 and over, adult: Secondary | ICD-10-CM | POA: Diagnosis not present

## 2019-04-04 DIAGNOSIS — Z792 Long term (current) use of antibiotics: Secondary | ICD-10-CM | POA: Diagnosis not present

## 2019-04-04 DIAGNOSIS — E1142 Type 2 diabetes mellitus with diabetic polyneuropathy: Secondary | ICD-10-CM | POA: Diagnosis not present

## 2019-04-04 DIAGNOSIS — L89620 Pressure ulcer of left heel, unstageable: Secondary | ICD-10-CM | POA: Diagnosis not present

## 2019-04-04 DIAGNOSIS — E11610 Type 2 diabetes mellitus with diabetic neuropathic arthropathy: Secondary | ICD-10-CM | POA: Diagnosis not present

## 2019-04-04 DIAGNOSIS — Z9111 Patient's noncompliance with dietary regimen: Secondary | ICD-10-CM | POA: Diagnosis not present

## 2019-04-04 DIAGNOSIS — M868X7 Other osteomyelitis, ankle and foot: Secondary | ICD-10-CM | POA: Diagnosis not present

## 2019-04-04 DIAGNOSIS — L97429 Non-pressure chronic ulcer of left heel and midfoot with unspecified severity: Secondary | ICD-10-CM | POA: Diagnosis not present

## 2019-04-11 DIAGNOSIS — F3111 Bipolar disorder, current episode manic without psychotic features, mild: Secondary | ICD-10-CM | POA: Diagnosis not present

## 2019-04-11 DIAGNOSIS — F319 Bipolar disorder, unspecified: Secondary | ICD-10-CM | POA: Diagnosis not present

## 2019-04-11 DIAGNOSIS — R4689 Other symptoms and signs involving appearance and behavior: Secondary | ICD-10-CM | POA: Diagnosis not present

## 2019-04-11 DIAGNOSIS — R799 Abnormal finding of blood chemistry, unspecified: Secondary | ICD-10-CM | POA: Diagnosis not present

## 2019-04-11 DIAGNOSIS — F3132 Bipolar disorder, current episode depressed, moderate: Secondary | ICD-10-CM | POA: Diagnosis not present

## 2019-04-12 DIAGNOSIS — L97423 Non-pressure chronic ulcer of left heel and midfoot with necrosis of muscle: Secondary | ICD-10-CM | POA: Diagnosis not present

## 2019-04-12 DIAGNOSIS — L89620 Pressure ulcer of left heel, unstageable: Secondary | ICD-10-CM | POA: Diagnosis not present

## 2019-04-12 DIAGNOSIS — E1142 Type 2 diabetes mellitus with diabetic polyneuropathy: Secondary | ICD-10-CM | POA: Diagnosis not present

## 2019-04-12 DIAGNOSIS — E11610 Type 2 diabetes mellitus with diabetic neuropathic arthropathy: Secondary | ICD-10-CM | POA: Diagnosis not present

## 2019-04-12 DIAGNOSIS — L97422 Non-pressure chronic ulcer of left heel and midfoot with fat layer exposed: Secondary | ICD-10-CM | POA: Diagnosis not present

## 2019-04-12 DIAGNOSIS — E11621 Type 2 diabetes mellitus with foot ulcer: Secondary | ICD-10-CM | POA: Diagnosis not present

## 2019-04-12 DIAGNOSIS — E1165 Type 2 diabetes mellitus with hyperglycemia: Secondary | ICD-10-CM | POA: Diagnosis not present

## 2019-04-15 ENCOUNTER — Other Ambulatory Visit: Payer: Self-pay | Admitting: Endocrinology

## 2019-04-18 ENCOUNTER — Telehealth: Payer: Self-pay

## 2019-04-18 DIAGNOSIS — E11621 Type 2 diabetes mellitus with foot ulcer: Secondary | ICD-10-CM | POA: Diagnosis not present

## 2019-04-18 DIAGNOSIS — E1142 Type 2 diabetes mellitus with diabetic polyneuropathy: Secondary | ICD-10-CM | POA: Diagnosis not present

## 2019-04-18 DIAGNOSIS — E1165 Type 2 diabetes mellitus with hyperglycemia: Secondary | ICD-10-CM | POA: Diagnosis not present

## 2019-04-18 DIAGNOSIS — L97422 Non-pressure chronic ulcer of left heel and midfoot with fat layer exposed: Secondary | ICD-10-CM | POA: Diagnosis not present

## 2019-04-18 DIAGNOSIS — Z9111 Patient's noncompliance with dietary regimen: Secondary | ICD-10-CM | POA: Diagnosis not present

## 2019-04-18 DIAGNOSIS — L97423 Non-pressure chronic ulcer of left heel and midfoot with necrosis of muscle: Secondary | ICD-10-CM | POA: Diagnosis not present

## 2019-04-18 DIAGNOSIS — L97429 Non-pressure chronic ulcer of left heel and midfoot with unspecified severity: Secondary | ICD-10-CM | POA: Diagnosis not present

## 2019-04-18 NOTE — Telephone Encounter (Signed)
-----   Message from Elayne Snare, MD sent at 04/16/2019 12:20 PM EST ----- Regarding: Appointment Patient overdue for appointment.  Please call to schedule with labs before appointment

## 2019-04-18 NOTE — Telephone Encounter (Signed)
Patient says she no longer being seen here. She has a new doctor now.

## 2019-04-22 DIAGNOSIS — E113511 Type 2 diabetes mellitus with proliferative diabetic retinopathy with macular edema, right eye: Secondary | ICD-10-CM | POA: Diagnosis not present

## 2019-04-25 DIAGNOSIS — S91329A Laceration with foreign body, unspecified foot, initial encounter: Secondary | ICD-10-CM | POA: Diagnosis not present

## 2019-04-26 DIAGNOSIS — L97422 Non-pressure chronic ulcer of left heel and midfoot with fat layer exposed: Secondary | ICD-10-CM | POA: Diagnosis not present

## 2019-04-26 DIAGNOSIS — L97522 Non-pressure chronic ulcer of other part of left foot with fat layer exposed: Secondary | ICD-10-CM | POA: Diagnosis not present

## 2019-04-26 DIAGNOSIS — E11621 Type 2 diabetes mellitus with foot ulcer: Secondary | ICD-10-CM | POA: Diagnosis not present

## 2019-04-26 DIAGNOSIS — Z89511 Acquired absence of right leg below knee: Secondary | ICD-10-CM | POA: Diagnosis not present

## 2019-04-26 DIAGNOSIS — Z9111 Patient's noncompliance with dietary regimen: Secondary | ICD-10-CM | POA: Diagnosis not present

## 2019-04-26 DIAGNOSIS — E11610 Type 2 diabetes mellitus with diabetic neuropathic arthropathy: Secondary | ICD-10-CM | POA: Diagnosis not present

## 2019-04-26 DIAGNOSIS — L97429 Non-pressure chronic ulcer of left heel and midfoot with unspecified severity: Secondary | ICD-10-CM | POA: Diagnosis not present

## 2019-04-26 DIAGNOSIS — E1142 Type 2 diabetes mellitus with diabetic polyneuropathy: Secondary | ICD-10-CM | POA: Diagnosis not present

## 2019-04-26 DIAGNOSIS — E1165 Type 2 diabetes mellitus with hyperglycemia: Secondary | ICD-10-CM | POA: Diagnosis not present

## 2019-04-26 DIAGNOSIS — Z794 Long term (current) use of insulin: Secondary | ICD-10-CM | POA: Diagnosis not present

## 2019-04-27 DIAGNOSIS — E113512 Type 2 diabetes mellitus with proliferative diabetic retinopathy with macular edema, left eye: Secondary | ICD-10-CM | POA: Diagnosis not present

## 2019-05-03 DIAGNOSIS — E118 Type 2 diabetes mellitus with unspecified complications: Secondary | ICD-10-CM | POA: Diagnosis not present

## 2019-05-03 DIAGNOSIS — E1122 Type 2 diabetes mellitus with diabetic chronic kidney disease: Secondary | ICD-10-CM | POA: Diagnosis not present

## 2019-05-03 DIAGNOSIS — Z794 Long term (current) use of insulin: Secondary | ICD-10-CM | POA: Diagnosis not present

## 2019-05-03 DIAGNOSIS — G2581 Restless legs syndrome: Secondary | ICD-10-CM | POA: Diagnosis not present

## 2019-05-03 DIAGNOSIS — E1140 Type 2 diabetes mellitus with diabetic neuropathy, unspecified: Secondary | ICD-10-CM | POA: Diagnosis not present

## 2019-05-03 DIAGNOSIS — E7800 Pure hypercholesterolemia, unspecified: Secondary | ICD-10-CM | POA: Diagnosis not present

## 2019-05-03 DIAGNOSIS — E11319 Type 2 diabetes mellitus with unspecified diabetic retinopathy without macular edema: Secondary | ICD-10-CM | POA: Diagnosis not present

## 2019-05-03 DIAGNOSIS — E1165 Type 2 diabetes mellitus with hyperglycemia: Secondary | ICD-10-CM | POA: Diagnosis not present

## 2019-05-04 DIAGNOSIS — L97222 Non-pressure chronic ulcer of left calf with fat layer exposed: Secondary | ICD-10-CM | POA: Diagnosis not present

## 2019-05-04 DIAGNOSIS — E11621 Type 2 diabetes mellitus with foot ulcer: Secondary | ICD-10-CM | POA: Diagnosis not present

## 2019-05-04 DIAGNOSIS — E1142 Type 2 diabetes mellitus with diabetic polyneuropathy: Secondary | ICD-10-CM | POA: Diagnosis not present

## 2019-05-04 DIAGNOSIS — L97422 Non-pressure chronic ulcer of left heel and midfoot with fat layer exposed: Secondary | ICD-10-CM | POA: Diagnosis not present

## 2019-05-04 DIAGNOSIS — L89620 Pressure ulcer of left heel, unstageable: Secondary | ICD-10-CM | POA: Diagnosis not present

## 2019-05-04 DIAGNOSIS — Z794 Long term (current) use of insulin: Secondary | ICD-10-CM | POA: Diagnosis not present

## 2019-05-04 DIAGNOSIS — E11610 Type 2 diabetes mellitus with diabetic neuropathic arthropathy: Secondary | ICD-10-CM | POA: Diagnosis not present

## 2019-05-04 DIAGNOSIS — E1165 Type 2 diabetes mellitus with hyperglycemia: Secondary | ICD-10-CM | POA: Diagnosis not present

## 2019-05-10 DIAGNOSIS — E1142 Type 2 diabetes mellitus with diabetic polyneuropathy: Secondary | ICD-10-CM | POA: Diagnosis not present

## 2019-05-10 DIAGNOSIS — L89620 Pressure ulcer of left heel, unstageable: Secondary | ICD-10-CM | POA: Diagnosis not present

## 2019-05-10 DIAGNOSIS — L97422 Non-pressure chronic ulcer of left heel and midfoot with fat layer exposed: Secondary | ICD-10-CM | POA: Diagnosis not present

## 2019-05-10 DIAGNOSIS — E11621 Type 2 diabetes mellitus with foot ulcer: Secondary | ICD-10-CM | POA: Diagnosis not present

## 2019-05-10 DIAGNOSIS — E11610 Type 2 diabetes mellitus with diabetic neuropathic arthropathy: Secondary | ICD-10-CM | POA: Diagnosis not present

## 2019-05-10 DIAGNOSIS — L97402 Non-pressure chronic ulcer of unspecified heel and midfoot with fat layer exposed: Secondary | ICD-10-CM | POA: Diagnosis not present

## 2019-05-10 DIAGNOSIS — E1165 Type 2 diabetes mellitus with hyperglycemia: Secondary | ICD-10-CM | POA: Diagnosis not present

## 2019-05-17 DIAGNOSIS — F39 Unspecified mood [affective] disorder: Secondary | ICD-10-CM | POA: Diagnosis not present

## 2019-05-17 DIAGNOSIS — E118 Type 2 diabetes mellitus with unspecified complications: Secondary | ICD-10-CM | POA: Diagnosis not present

## 2019-05-17 DIAGNOSIS — E1140 Type 2 diabetes mellitus with diabetic neuropathy, unspecified: Secondary | ICD-10-CM | POA: Diagnosis not present

## 2019-05-17 DIAGNOSIS — R635 Abnormal weight gain: Secondary | ICD-10-CM | POA: Diagnosis not present

## 2019-05-17 DIAGNOSIS — Z794 Long term (current) use of insulin: Secondary | ICD-10-CM | POA: Diagnosis not present

## 2019-05-19 DIAGNOSIS — L97422 Non-pressure chronic ulcer of left heel and midfoot with fat layer exposed: Secondary | ICD-10-CM | POA: Diagnosis not present

## 2019-05-19 DIAGNOSIS — E11621 Type 2 diabetes mellitus with foot ulcer: Secondary | ICD-10-CM | POA: Diagnosis not present

## 2019-05-19 DIAGNOSIS — L89620 Pressure ulcer of left heel, unstageable: Secondary | ICD-10-CM | POA: Diagnosis not present

## 2019-05-19 DIAGNOSIS — E1142 Type 2 diabetes mellitus with diabetic polyneuropathy: Secondary | ICD-10-CM | POA: Diagnosis not present

## 2019-05-19 DIAGNOSIS — L97429 Non-pressure chronic ulcer of left heel and midfoot with unspecified severity: Secondary | ICD-10-CM | POA: Diagnosis not present

## 2019-05-19 DIAGNOSIS — E11610 Type 2 diabetes mellitus with diabetic neuropathic arthropathy: Secondary | ICD-10-CM | POA: Diagnosis not present

## 2019-05-20 DIAGNOSIS — H3582 Retinal ischemia: Secondary | ICD-10-CM | POA: Diagnosis not present

## 2019-05-20 DIAGNOSIS — E113511 Type 2 diabetes mellitus with proliferative diabetic retinopathy with macular edema, right eye: Secondary | ICD-10-CM | POA: Diagnosis not present

## 2019-05-20 DIAGNOSIS — E113512 Type 2 diabetes mellitus with proliferative diabetic retinopathy with macular edema, left eye: Secondary | ICD-10-CM | POA: Diagnosis not present

## 2019-05-20 DIAGNOSIS — Z961 Presence of intraocular lens: Secondary | ICD-10-CM | POA: Diagnosis not present

## 2019-05-20 DIAGNOSIS — H35373 Puckering of macula, bilateral: Secondary | ICD-10-CM | POA: Diagnosis not present

## 2019-05-20 DIAGNOSIS — H31093 Other chorioretinal scars, bilateral: Secondary | ICD-10-CM | POA: Diagnosis not present

## 2019-05-20 DIAGNOSIS — H35033 Hypertensive retinopathy, bilateral: Secondary | ICD-10-CM | POA: Diagnosis not present

## 2019-05-24 DIAGNOSIS — E1142 Type 2 diabetes mellitus with diabetic polyneuropathy: Secondary | ICD-10-CM | POA: Diagnosis not present

## 2019-05-24 DIAGNOSIS — E1165 Type 2 diabetes mellitus with hyperglycemia: Secondary | ICD-10-CM | POA: Diagnosis not present

## 2019-05-24 DIAGNOSIS — E1140 Type 2 diabetes mellitus with diabetic neuropathy, unspecified: Secondary | ICD-10-CM | POA: Diagnosis not present

## 2019-05-24 DIAGNOSIS — I890 Lymphedema, not elsewhere classified: Secondary | ICD-10-CM | POA: Diagnosis not present

## 2019-05-24 DIAGNOSIS — L89620 Pressure ulcer of left heel, unstageable: Secondary | ICD-10-CM | POA: Diagnosis not present

## 2019-05-24 DIAGNOSIS — L97422 Non-pressure chronic ulcer of left heel and midfoot with fat layer exposed: Secondary | ICD-10-CM | POA: Diagnosis not present

## 2019-05-24 DIAGNOSIS — N189 Chronic kidney disease, unspecified: Secondary | ICD-10-CM | POA: Diagnosis not present

## 2019-05-24 DIAGNOSIS — Z89511 Acquired absence of right leg below knee: Secondary | ICD-10-CM | POA: Diagnosis not present

## 2019-05-24 DIAGNOSIS — E11621 Type 2 diabetes mellitus with foot ulcer: Secondary | ICD-10-CM | POA: Diagnosis not present

## 2019-05-24 DIAGNOSIS — E1122 Type 2 diabetes mellitus with diabetic chronic kidney disease: Secondary | ICD-10-CM | POA: Diagnosis not present

## 2019-05-24 DIAGNOSIS — E11610 Type 2 diabetes mellitus with diabetic neuropathic arthropathy: Secondary | ICD-10-CM | POA: Diagnosis not present

## 2019-05-25 DIAGNOSIS — S91329A Laceration with foreign body, unspecified foot, initial encounter: Secondary | ICD-10-CM | POA: Diagnosis not present

## 2019-05-31 DIAGNOSIS — E1122 Type 2 diabetes mellitus with diabetic chronic kidney disease: Secondary | ICD-10-CM | POA: Diagnosis not present

## 2019-05-31 DIAGNOSIS — E11621 Type 2 diabetes mellitus with foot ulcer: Secondary | ICD-10-CM | POA: Diagnosis not present

## 2019-05-31 DIAGNOSIS — L97429 Non-pressure chronic ulcer of left heel and midfoot with unspecified severity: Secondary | ICD-10-CM | POA: Diagnosis not present

## 2019-05-31 DIAGNOSIS — E1165 Type 2 diabetes mellitus with hyperglycemia: Secondary | ICD-10-CM | POA: Diagnosis not present

## 2019-05-31 DIAGNOSIS — L97422 Non-pressure chronic ulcer of left heel and midfoot with fat layer exposed: Secondary | ICD-10-CM | POA: Diagnosis not present

## 2019-05-31 DIAGNOSIS — E11319 Type 2 diabetes mellitus with unspecified diabetic retinopathy without macular edema: Secondary | ICD-10-CM | POA: Diagnosis not present

## 2019-05-31 DIAGNOSIS — Z89511 Acquired absence of right leg below knee: Secondary | ICD-10-CM | POA: Diagnosis not present

## 2019-05-31 DIAGNOSIS — Z794 Long term (current) use of insulin: Secondary | ICD-10-CM | POA: Diagnosis not present

## 2019-05-31 DIAGNOSIS — E1140 Type 2 diabetes mellitus with diabetic neuropathy, unspecified: Secondary | ICD-10-CM | POA: Diagnosis not present

## 2019-05-31 DIAGNOSIS — E118 Type 2 diabetes mellitus with unspecified complications: Secondary | ICD-10-CM | POA: Diagnosis not present

## 2019-05-31 DIAGNOSIS — E1142 Type 2 diabetes mellitus with diabetic polyneuropathy: Secondary | ICD-10-CM | POA: Diagnosis not present

## 2019-05-31 DIAGNOSIS — E7800 Pure hypercholesterolemia, unspecified: Secondary | ICD-10-CM | POA: Diagnosis not present

## 2019-06-07 DIAGNOSIS — L97422 Non-pressure chronic ulcer of left heel and midfoot with fat layer exposed: Secondary | ICD-10-CM | POA: Diagnosis not present

## 2019-06-07 DIAGNOSIS — L89622 Pressure ulcer of left heel, stage 2: Secondary | ICD-10-CM | POA: Diagnosis not present

## 2019-06-07 DIAGNOSIS — L89620 Pressure ulcer of left heel, unstageable: Secondary | ICD-10-CM | POA: Diagnosis not present

## 2019-06-07 DIAGNOSIS — E11621 Type 2 diabetes mellitus with foot ulcer: Secondary | ICD-10-CM | POA: Diagnosis not present

## 2019-06-07 DIAGNOSIS — E1142 Type 2 diabetes mellitus with diabetic polyneuropathy: Secondary | ICD-10-CM | POA: Diagnosis not present

## 2019-06-07 DIAGNOSIS — E1165 Type 2 diabetes mellitus with hyperglycemia: Secondary | ICD-10-CM | POA: Diagnosis not present

## 2019-06-07 DIAGNOSIS — E11610 Type 2 diabetes mellitus with diabetic neuropathic arthropathy: Secondary | ICD-10-CM | POA: Diagnosis not present

## 2019-06-10 DIAGNOSIS — E113511 Type 2 diabetes mellitus with proliferative diabetic retinopathy with macular edema, right eye: Secondary | ICD-10-CM | POA: Diagnosis not present

## 2019-06-14 DIAGNOSIS — Z794 Long term (current) use of insulin: Secondary | ICD-10-CM | POA: Diagnosis not present

## 2019-06-14 DIAGNOSIS — E1165 Type 2 diabetes mellitus with hyperglycemia: Secondary | ICD-10-CM | POA: Diagnosis not present

## 2019-06-14 DIAGNOSIS — E11610 Type 2 diabetes mellitus with diabetic neuropathic arthropathy: Secondary | ICD-10-CM | POA: Diagnosis not present

## 2019-06-14 DIAGNOSIS — E11621 Type 2 diabetes mellitus with foot ulcer: Secondary | ICD-10-CM | POA: Diagnosis not present

## 2019-06-14 DIAGNOSIS — L97422 Non-pressure chronic ulcer of left heel and midfoot with fat layer exposed: Secondary | ICD-10-CM | POA: Diagnosis not present

## 2019-06-14 DIAGNOSIS — E1142 Type 2 diabetes mellitus with diabetic polyneuropathy: Secondary | ICD-10-CM | POA: Diagnosis not present

## 2019-06-14 DIAGNOSIS — L89620 Pressure ulcer of left heel, unstageable: Secondary | ICD-10-CM | POA: Diagnosis not present

## 2019-06-15 DIAGNOSIS — Z89511 Acquired absence of right leg below knee: Secondary | ICD-10-CM | POA: Diagnosis not present

## 2019-06-15 DIAGNOSIS — E118 Type 2 diabetes mellitus with unspecified complications: Secondary | ICD-10-CM | POA: Diagnosis not present

## 2019-06-15 DIAGNOSIS — E1140 Type 2 diabetes mellitus with diabetic neuropathy, unspecified: Secondary | ICD-10-CM | POA: Diagnosis not present

## 2019-06-17 DIAGNOSIS — E113512 Type 2 diabetes mellitus with proliferative diabetic retinopathy with macular edema, left eye: Secondary | ICD-10-CM | POA: Diagnosis not present

## 2019-06-19 DIAGNOSIS — M7989 Other specified soft tissue disorders: Secondary | ICD-10-CM | POA: Diagnosis not present

## 2019-06-19 DIAGNOSIS — E118 Type 2 diabetes mellitus with unspecified complications: Secondary | ICD-10-CM | POA: Diagnosis not present

## 2019-06-19 DIAGNOSIS — D649 Anemia, unspecified: Secondary | ICD-10-CM | POA: Diagnosis not present

## 2019-06-19 DIAGNOSIS — Z7951 Long term (current) use of inhaled steroids: Secondary | ICD-10-CM | POA: Diagnosis not present

## 2019-06-19 DIAGNOSIS — N183 Chronic kidney disease, stage 3 unspecified: Secondary | ICD-10-CM | POA: Diagnosis not present

## 2019-06-19 DIAGNOSIS — I444 Left anterior fascicular block: Secondary | ICD-10-CM | POA: Diagnosis not present

## 2019-06-19 DIAGNOSIS — R4182 Altered mental status, unspecified: Secondary | ICD-10-CM | POA: Diagnosis not present

## 2019-06-19 DIAGNOSIS — R112 Nausea with vomiting, unspecified: Secondary | ICD-10-CM | POA: Diagnosis not present

## 2019-06-19 DIAGNOSIS — Z6841 Body Mass Index (BMI) 40.0 and over, adult: Secondary | ICD-10-CM | POA: Diagnosis not present

## 2019-06-19 DIAGNOSIS — E860 Dehydration: Secondary | ICD-10-CM | POA: Diagnosis not present

## 2019-06-19 DIAGNOSIS — E11628 Type 2 diabetes mellitus with other skin complications: Secondary | ICD-10-CM | POA: Diagnosis not present

## 2019-06-19 DIAGNOSIS — R1110 Vomiting, unspecified: Secondary | ICD-10-CM | POA: Diagnosis not present

## 2019-06-19 DIAGNOSIS — Z89511 Acquired absence of right leg below knee: Secondary | ICD-10-CM | POA: Diagnosis not present

## 2019-06-19 DIAGNOSIS — G92 Toxic encephalopathy: Secondary | ICD-10-CM | POA: Diagnosis not present

## 2019-06-19 DIAGNOSIS — N179 Acute kidney failure, unspecified: Secondary | ICD-10-CM | POA: Diagnosis not present

## 2019-06-19 DIAGNOSIS — L03116 Cellulitis of left lower limb: Secondary | ICD-10-CM | POA: Diagnosis not present

## 2019-06-19 DIAGNOSIS — R921 Mammographic calcification found on diagnostic imaging of breast: Secondary | ICD-10-CM | POA: Diagnosis not present

## 2019-06-19 DIAGNOSIS — R410 Disorientation, unspecified: Secondary | ICD-10-CM | POA: Diagnosis not present

## 2019-06-19 DIAGNOSIS — R140 Abdominal distension (gaseous): Secondary | ICD-10-CM | POA: Diagnosis not present

## 2019-06-19 DIAGNOSIS — R000 Tachycardia, unspecified: Secondary | ICD-10-CM | POA: Diagnosis not present

## 2019-06-19 DIAGNOSIS — E1165 Type 2 diabetes mellitus with hyperglycemia: Secondary | ICD-10-CM | POA: Diagnosis not present

## 2019-06-19 DIAGNOSIS — F319 Bipolar disorder, unspecified: Secondary | ICD-10-CM | POA: Diagnosis not present

## 2019-06-19 DIAGNOSIS — K921 Melena: Secondary | ICD-10-CM | POA: Diagnosis not present

## 2019-06-19 DIAGNOSIS — E785 Hyperlipidemia, unspecified: Secondary | ICD-10-CM | POA: Diagnosis not present

## 2019-06-19 DIAGNOSIS — Z794 Long term (current) use of insulin: Secondary | ICD-10-CM | POA: Diagnosis not present

## 2019-06-19 DIAGNOSIS — Z79899 Other long term (current) drug therapy: Secondary | ICD-10-CM | POA: Diagnosis not present

## 2019-06-19 DIAGNOSIS — I517 Cardiomegaly: Secondary | ICD-10-CM | POA: Diagnosis not present

## 2019-06-19 DIAGNOSIS — K922 Gastrointestinal hemorrhage, unspecified: Secondary | ICD-10-CM | POA: Diagnosis not present

## 2019-06-19 DIAGNOSIS — D62 Acute posthemorrhagic anemia: Secondary | ICD-10-CM | POA: Diagnosis not present

## 2019-06-19 DIAGNOSIS — N390 Urinary tract infection, site not specified: Secondary | ICD-10-CM | POA: Diagnosis not present

## 2019-06-19 DIAGNOSIS — K449 Diaphragmatic hernia without obstruction or gangrene: Secondary | ICD-10-CM | POA: Diagnosis not present

## 2019-06-19 DIAGNOSIS — R1310 Dysphagia, unspecified: Secondary | ICD-10-CM | POA: Diagnosis not present

## 2019-06-19 DIAGNOSIS — K2971 Gastritis, unspecified, with bleeding: Secondary | ICD-10-CM | POA: Diagnosis not present

## 2019-06-19 DIAGNOSIS — I4510 Unspecified right bundle-branch block: Secondary | ICD-10-CM | POA: Diagnosis not present

## 2019-06-19 DIAGNOSIS — E1122 Type 2 diabetes mellitus with diabetic chronic kidney disease: Secondary | ICD-10-CM | POA: Diagnosis not present

## 2019-06-19 DIAGNOSIS — G9341 Metabolic encephalopathy: Secondary | ICD-10-CM | POA: Diagnosis not present

## 2019-06-19 DIAGNOSIS — G4733 Obstructive sleep apnea (adult) (pediatric): Secondary | ICD-10-CM | POA: Diagnosis not present

## 2019-06-19 DIAGNOSIS — E7800 Pure hypercholesterolemia, unspecified: Secondary | ICD-10-CM | POA: Diagnosis not present

## 2019-06-28 DIAGNOSIS — R05 Cough: Secondary | ICD-10-CM | POA: Diagnosis not present

## 2019-06-28 DIAGNOSIS — R110 Nausea: Secondary | ICD-10-CM | POA: Diagnosis not present

## 2019-06-28 DIAGNOSIS — R58 Hemorrhage, not elsewhere classified: Secondary | ICD-10-CM | POA: Diagnosis not present

## 2019-06-28 DIAGNOSIS — E1165 Type 2 diabetes mellitus with hyperglycemia: Secondary | ICD-10-CM | POA: Diagnosis not present

## 2019-06-28 DIAGNOSIS — I10 Essential (primary) hypertension: Secondary | ICD-10-CM | POA: Diagnosis not present

## 2019-06-28 DIAGNOSIS — R112 Nausea with vomiting, unspecified: Secondary | ICD-10-CM | POA: Diagnosis not present

## 2019-06-29 ENCOUNTER — Emergency Department (HOSPITAL_COMMUNITY)
Admission: EM | Admit: 2019-06-29 | Discharge: 2019-06-30 | Disposition: A | Discharge status: Home / Self Care | Payer: PPO | Attending: Emergency Medicine | Admitting: Emergency Medicine

## 2019-06-29 ENCOUNTER — Encounter (HOSPITAL_COMMUNITY): Payer: Self-pay

## 2019-06-29 ENCOUNTER — Other Ambulatory Visit: Payer: Self-pay

## 2019-06-29 DIAGNOSIS — R112 Nausea with vomiting, unspecified: Secondary | ICD-10-CM | POA: Insufficient documentation

## 2019-06-29 DIAGNOSIS — Z794 Long term (current) use of insulin: Secondary | ICD-10-CM | POA: Insufficient documentation

## 2019-06-29 DIAGNOSIS — E119 Type 2 diabetes mellitus without complications: Secondary | ICD-10-CM | POA: Diagnosis not present

## 2019-06-29 DIAGNOSIS — Z79899 Other long term (current) drug therapy: Secondary | ICD-10-CM | POA: Insufficient documentation

## 2019-06-29 DIAGNOSIS — R110 Nausea: Secondary | ICD-10-CM

## 2019-06-29 DIAGNOSIS — Z91040 Latex allergy status: Secondary | ICD-10-CM | POA: Diagnosis not present

## 2019-06-29 DIAGNOSIS — I10 Essential (primary) hypertension: Secondary | ICD-10-CM | POA: Insufficient documentation

## 2019-06-29 DIAGNOSIS — Z7982 Long term (current) use of aspirin: Secondary | ICD-10-CM | POA: Insufficient documentation

## 2019-06-29 LAB — COMPREHENSIVE METABOLIC PANEL
ALT: 56 U/L — ABNORMAL HIGH (ref 0–44)
AST: 37 U/L (ref 15–41)
Albumin: 3 g/dL — ABNORMAL LOW (ref 3.5–5.0)
Alkaline Phosphatase: 157 U/L — ABNORMAL HIGH (ref 38–126)
Anion gap: 11 (ref 5–15)
BUN: 20 mg/dL (ref 8–23)
CO2: 20 mmol/L — ABNORMAL LOW (ref 22–32)
Calcium: 8.8 mg/dL — ABNORMAL LOW (ref 8.9–10.3)
Chloride: 103 mmol/L (ref 98–111)
Creatinine, Ser: 1.11 mg/dL — ABNORMAL HIGH (ref 0.44–1.00)
GFR calc Af Amer: >60 mL/min (ref >60)
GFR calc non Af Amer: 53 mL/min — ABNORMAL LOW (ref >60)
Glucose, Bld: 384 mg/dL — ABNORMAL HIGH (ref 70–99)
Potassium: 5 mmol/L (ref 3.5–5.1)
Sodium: 134 mmol/L — ABNORMAL LOW (ref 135–145)
Total Bilirubin: 1.1 mg/dL (ref 0.3–1.2)
Total Protein: 6.8 g/dL (ref 6.5–8.1)

## 2019-06-29 LAB — CBC
HCT: 34.4 % — ABNORMAL LOW (ref 36.0–46.0)
Hemoglobin: 11.2 g/dL — ABNORMAL LOW (ref 12.0–15.0)
MCH: 31.9 pg (ref 26.0–34.0)
MCHC: 32.6 g/dL (ref 30.0–36.0)
MCV: 98 fL (ref 80.0–100.0)
Platelets: 341 10*3/uL (ref 150–400)
RBC: 3.51 MIL/uL — ABNORMAL LOW (ref 3.87–5.11)
RDW: 13 % (ref 11.5–15.5)
WBC: 7.9 10*3/uL (ref 4.0–10.5)
nRBC: 0 % (ref 0.0–0.2)

## 2019-06-29 LAB — LIPASE, BLOOD: Lipase: 39 U/L (ref 11–51)

## 2019-06-29 MED ORDER — SODIUM CHLORIDE 0.9% FLUSH: 3.0000 mL | Freq: Once | INTRAVENOUS | Status: DC

## 2019-06-29 NOTE — ED Triage Notes (Signed)
Pt reports Dana [name], pt's PCP wants her to be seen for a psych eval due to her vomiting all of her meds up. Pt reports she has been vomiting x3 days. Pt denies HI/SI. Pt was seen at [facility] for the same, dc'd her home.

## 2019-06-29 NOTE — ED Notes (Signed)
Pt called x1 for triage

## 2019-06-29 NOTE — ED Provider Notes (Signed)
[facility] EMERGENCY DEPARTMENT Provider Note  CSN: [id number] Arrival date & time: 06/29/19 1725  Chief Complaint(s) Emesis  HPI Dana [name] is a 64 y.o. female with a past medical history listed below including diabetes who presents to the emergency department with nausea nonbloody nonbilious emesis.  Patient reports that this been ongoing for approximately 1 week.  Patient was seen at [facility] for the same yesterday and had reassuring work-up.  Patient returns today because she spoke with her primary care doctor who requested she come to have a psychiatric evaluation.  She states that psych eval is because she has not been taking her medications due to the nausea and emesis.  Patient denies any suicidal/homicidal ideations.  No AVH.  She denies any abdominal pain.  States that she was able to keep down food and liquids today.  States that she did not take her medication because she just did not feel well.  Patient denies any other physical complaints at this time.  HPI  Past Medical History Past Medical History:  Diagnosis Date  . Bipolar 1 disorder ([place])   . Cellulitis and abscess of foot 03/08/2015  . Diabetes mellitus    INSULIN DEPENDENT  . Diabetic neuropathy ([place])   . 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 H/O hiatal hernia   . Hyperlipidemia   . Hypertension    past hx of  . Iron malabsorption 09/26/2015  . Numbness and tingling    Hx; of in B/LLE and B/LUE  . Other iron deficiency anemias 09/20/2015   Patient Active Problem List   Diagnosis Date Noted  . Partial nontraumatic amputation of left foot ([place]) 04/28/2017  . Acquired absence of right leg below knee ([place]) 04/14/2017  . Pleural effusion 01/19/2017  . Contracture of left Achilles tendon 01/16/2017  . Allergic rhinitis 01/03/2017  . Gastroesophageal reflux disease without esophagitis 01/03/2017  . Hiatal hernia 02/03/2016  . Anemia associated with diabetes mellitus ([place])  11/07/2015  . Iron malabsorption 09/26/2015  . Other iron deficiency anemia 09/20/2015  . 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 Diabetic retinopathy associated with diabetes mellitus due to underlying condition ([place]) 03/15/2015  . DM (diabetes mellitus) type II uncontrolled with eye manifestation ([place]) 11/08/2014  . Hyperlipidemia LDL goal <100 11/08/2014  . Diabetic polyneuropathy associated with type 2 diabetes mellitus ([place]) 11/08/2014  . Partial nontraumatic amputation of foot ([place]) 11/08/2014  . Type 2 diabetes mellitus with diabetic polyneuropathy, with long-term current use of insulin ([place]) 10/24/2011  . Bipolar 1 disorder ([place]) 10/24/2011   Home Medication(s) Prior to Admission medications   Medication Sig Start Date End Date Taking? Authorizing Provider  aspirin 325 MG EC tablet Take 325 mg by mouth daily.    [provider]  atorvastatin (LIPITOR) 20 MG tablet Take 1 tablet (20 mg total) by mouth at bedtime. 07/20/18   Scot Jun, FNP  BD VEO INSULIN SYRINGE U/F 31G X 15/64" 1 ML MISC Administer insulin up 3 times per day per sliding scale E.11.9 07/20/18   Scot Jun, FNP  busPIRone (BUSPAR) 5 MG tablet Take 1 tablet by mouth 3 (three) times daily. 06/26/18   [provider]  Ferrous Sulfate (IRON) 325 (65 Fe) MG TABS Take by mouth daily.    [provider]  fluticasone (FLONASE) 50 MCG/ACT nasal spray USE 2 SPRAY(S) IN EACH NOSTRIL ONCE DAILY 07/26/18   Scot Jun, FNP  furosemide (LASIX) 20 MG tablet TAKE 1 TABLET BY MOUTH  TWICE DAILY 02/24/18   Brunetta Jeans, PA-C  gabapentin (NEURONTIN) 600 MG tablet Take 2 tablets (1,200 mg total) by mouth 3 (three) times daily. 07/20/18   Scot Jun, FNP  glucose blood (FREESTYLE LITE) test strip USE AS INSTRUCTED TO CHECK BLOOD SUGAR 3 TIMES DAILY. DX:E11.65 07/20/18   Scot Jun, FNP  insulin regular (NOVOLIN R,HUMULIN R) 100 units/mL injection Administer per sliding  scale provided 07/20/18   Scot Jun, FNP  Lancets (FREESTYLE) lancets Use as instructed to check blood sugar 3 times per day dx code E11.39 07/20/18   Scot Jun, FNP  metFORMIN (GLUCOPHAGE-XR) 500 MG 24 hr tablet TAKE 2 TABLETS IN THE MORNING AND 2 TABLETS AT NIGHT. Patient taking differently: 500 mg. 01/10/2019 TAKE 1 TABLETS IN THE MORNING AND 1 TABLET AT NIGHT. Per patient. 12/09/17   Elayne Snare, MD  NOVOLIN N RELION 100 UNIT/ML injection INJECT 60 UNITS AM and 50 units PM SUBCUTANEOUSLY TWICE DAILY FOR 30 DAYS Patient taking differently: 01/10/2019 INJECT 75 UNITS AM and 75 units PM SUBCUTANEOUSLY . Patient reports. 07/20/18   Scot Jun, FNP  Ostomy Supplies (SKIN TAC ADHESIVE BARRIER WIPE) MISC 1 each by Does not apply route daily. 11/25/17   Elayne Snare, MD  pantoprazole (PROTONIX) 40 MG tablet Take 1 tablet (40 mg total) by mouth daily. 07/20/18   Scot Jun, FNP  QUEtiapine (SEROQUEL) 50 MG tablet Take by mouth.    [provider]  VRAYLAR capsule Take 3 mg by mouth daily. 02/19/18   [provider]                                                                                                                                    Past Surgical History Past Surgical History:  Procedure Laterality Date  . ABDOMINAL HYSTERECTOMY    . ADENOIDECTOMY     Hx: of  . AMPUTATION  10/24/2011   Procedure: AMPUTATION RAY;  Surgeon: Newt Minion, MD;  Location: [facility];  Service: Orthopedics;  Laterality: Right;  Right Foot 3rd Ray Amputation  . AMPUTATION Right 12/16/2012   Procedure: Right Foot Transmetatarsal Amputation;  Surgeon: Newt Minion, MD;  Location: [facility];  Service: Orthopedics;  Laterality: Right;  . AMPUTATION Left 03/09/2015   Procedure: LEFT FOOT 1ST RAY AMPUTATION;  Surgeon: Newt Minion, MD;  Location: [facility];  Service: Orthopedics;  Laterality: Left;  . BLADDER SURGERY     x 2, tacked 1st time; mesh "eroded", had to be removed  .  Bladder Tact   2002  . BREAST SURGERY Left    "knot removed"  . CARPAL TUNNEL RELEASE Left   . COLON SURGERY    . EYE SURGERY  11/17/2017  . NASAL SEPTUM SURGERY  1976  . TONSILLECTOMY     age 62's   Family History Family History  Problem Relation Age of Onset  .  Diabetes Mother   . Mental illness Mother   . Bipolar disorder Mother   . Hypertension Father   . Osteoarthritis Father   . Heart disease Father   . Diabetes Sister   . Early death Brother        MVA  . Healthy Sister   . Diabetes Maternal Grandmother     Social History Social History   Tobacco Use  . Smoking status: Never Smoker  . Smokeless tobacco: Never Used  Substance Use Topics  . Alcohol use: No    Alcohol/week: 0.0 standard drinks  . Drug use: No   Allergies Keflex [cephalexin], Lithium, Ativan [lorazepam], Demerol [meperidine], Oxycodone, Codeine, Doxycycline, Darvocet [propoxyphene n-acetaminophen], Latex, and Propoxyphene  Review of Systems Review of Systems All other systems are reviewed and are negative for acute change except as noted in the HPI  Physical Exam Vital Signs  I have reviewed the triage vital signs BP (!) 163/92 (BP Location: Right Arm)   Pulse 75   Temp 98.4 F (36.9 C) (Oral)   Resp 18   SpO2 100%   Physical Exam Vitals reviewed.  Constitutional:      General: She is not in acute distress.    Appearance: She is well-developed. She is not diaphoretic.  HENT:     Head: Normocephalic and atraumatic.     Comments: HER-2 is    Right Ear: External ear normal.     Left Ear: External ear normal.     Nose: Nose normal.  Eyes:     General: No scleral icterus.    Conjunctiva/sclera: Conjunctivae normal.  Neck:     Trachea: Phonation normal.  Cardiovascular:     Rate and Rhythm: Normal rate and regular rhythm.  Pulmonary:     Effort: Pulmonary effort is normal. No respiratory distress.     Breath sounds: No stridor.  Abdominal:     General: There is no distension.      Tenderness: There is no abdominal tenderness.  Musculoskeletal:        General: Normal range of motion.     Cervical back: Normal range of motion.       Legs:  Skin:    Comments: hirsutism  Neurological:     Mental Status: She is alert and oriented to person, place, and time.  Psychiatric:        Behavior: Behavior normal.     ED Results and Treatments Labs (all labs ordered are listed, but only abnormal results are displayed) Labs Reviewed  COMPREHENSIVE METABOLIC PANEL - Abnormal; Notable for the following components:      Result Value   Sodium 134 (*)    CO2 20 (*)    Glucose, Bld 384 (*)    Creatinine, Ser 1.11 (*)    Calcium 8.8 (*)    Albumin 3.0 (*)    ALT 56 (*)    Alkaline Phosphatase 157 (*)    GFR calc non Af Amer 53 (*)    All other components within normal limits  CBC - Abnormal; Notable for the following components:   RBC 3.51 (*)    Hemoglobin 11.2 (*)    HCT 34.4 (*)    All other components within normal limits  LIPASE, BLOOD  URINALYSIS, ROUTINE W REFLEX MICROSCOPIC  EKG  EKG Interpretation  Date/Time:    Ventricular Rate:    PR Interval:    QRS Duration:   QT Interval:    QTC Calculation:   R Axis:     Text Interpretation:        Radiology No results found.  Pertinent labs & imaging results that were available during my care of the patient were reviewed by me and considered in my medical decision making (see chart for details).  Medications Ordered in ED Medications  sodium chloride flush (NS) 0.9 % injection 3 mL (has no administration in time range)                                                                                                                                    Procedures Procedures  (including critical care time)  Medical Decision Making / ED Course I have reviewed the nursing notes for this  encounter and the patient's prior records (if available in EHR or on provided paperwork).   Arthea B [name] was evaluated in Emergency Department on 06/30/2019 for the symptoms described in the history of present illness. She was evaluated in the context of the global COVID-19 pandemic, which necessitated consideration that the patient might be at risk for infection with the SARS-CoV-2 virus that causes COVID-19. Institutional protocols and algorithms that pertain to the evaluation of patients at risk for COVID-19 are in a state of rapid change based on information released by regulatory bodies including the CDC and federal and state organizations. These policies and algorithms were followed during the patient's care in the ED.  Patient presents with nausea and nonbloody nonbilious emesis which appears to be improving.  No emesis today.  Labs drawn in triage are reassuring with no leukocytosis or significant anemia.  Patient has hyperglycemia without evidence of DKA.  Mildly elevated ALT and alk phos.  She has no abdominal tenderness concerning for acute cholecystitis.  No evidence of pancreatitis.  Urinalysis performed at [facility] regional yesterday did not reveal evidence of urinary tract infection.  No need for repeat.  Patient provided with oral hydration and able to tolerate.  Patient does not appear to be a threat to herself or others.  Does not exhibit manic or hypomanic symptoms requiring emergent psychiatric evaluation.  Will provide patient with resources.        Final Clinical Impression(s) / ED Diagnoses Final diagnoses:  Nauseated    The patient appears reasonably screened and/or stabilized for discharge and I doubt any other medical condition or other [facility] requiring further screening, evaluation, or treatment in the ED at this time prior to discharge. Safe for discharge with strict return precautions.  Disposition: Discharge  Condition: Good  I have discussed the results, Dx and  Tx plan with the patient/family who expressed understanding and agree(s) with the plan. Discharge instructions discussed at length. The patient/family was given strict return precautions who verbalized understanding  of the instructions. No further questions at time of discharge.    ED Discharge Orders    None        Follow Up: [name], DO [place] [id number] [phone]  Schedule an appointment as soon as possible for a visit  As needed    This chart was dictated using voice recognition software.  Despite best efforts to proofread,  errors can occur which can change the documentation meaning.   Fatima Blank, MD 06/30/19 0002

## 2019-06-30 NOTE — ED Notes (Signed)
Pt is requesting this RN or MD call son because son is who spoke to previous MD at [facility], wanted previous MD to have access to results obtained today. This RN advised that MD was aware of previous encounter and that previous MD would be able to access chart to see results.

## 2019-06-30 NOTE — ED Notes (Signed)
Discharge instructions discussed with Pt. Pt verbalized understanding. Pt stable and ambulatory.    

## 2019-07-03 ENCOUNTER — Emergency Department (HOSPITAL_COMMUNITY)
Admission: EM | Admit: 2019-07-03 | Discharge: 2019-07-04 | Disposition: A | Discharge status: Home / Self Care | Payer: PPO | Attending: Emergency Medicine | Admitting: Emergency Medicine

## 2019-07-03 ENCOUNTER — Encounter (HOSPITAL_COMMUNITY): Payer: Self-pay

## 2019-07-03 ENCOUNTER — Other Ambulatory Visit: Payer: Self-pay

## 2019-07-03 ENCOUNTER — Emergency Department (HOSPITAL_COMMUNITY): Payer: PPO

## 2019-07-03 DIAGNOSIS — E119 Type 2 diabetes mellitus without complications: Secondary | ICD-10-CM | POA: Insufficient documentation

## 2019-07-03 DIAGNOSIS — Z7982 Long term (current) use of aspirin: Secondary | ICD-10-CM | POA: Diagnosis not present

## 2019-07-03 DIAGNOSIS — Z79899 Other long term (current) drug therapy: Secondary | ICD-10-CM | POA: Diagnosis not present

## 2019-07-03 DIAGNOSIS — Z89512 Acquired absence of left leg below knee: Secondary | ICD-10-CM | POA: Insufficient documentation

## 2019-07-03 DIAGNOSIS — F32A Depression, unspecified: Secondary | ICD-10-CM

## 2019-07-03 DIAGNOSIS — M7989 Other specified soft tissue disorders: Secondary | ICD-10-CM | POA: Diagnosis not present

## 2019-07-03 DIAGNOSIS — F329 Major depressive disorder, single episode, unspecified: Secondary | ICD-10-CM | POA: Insufficient documentation

## 2019-07-03 DIAGNOSIS — Z20822 Contact with and (suspected) exposure to covid-19: Secondary | ICD-10-CM | POA: Diagnosis not present

## 2019-07-03 DIAGNOSIS — Z03818 Encounter for observation for suspected exposure to other biological agents ruled out: Secondary | ICD-10-CM | POA: Diagnosis not present

## 2019-07-03 DIAGNOSIS — Z794 Long term (current) use of insulin: Secondary | ICD-10-CM | POA: Insufficient documentation

## 2019-07-03 DIAGNOSIS — I10 Essential (primary) hypertension: Secondary | ICD-10-CM | POA: Insufficient documentation

## 2019-07-03 LAB — CBC WITH DIFFERENTIAL/PLATELET
Abs Immature Granulocytes: 0.04 10*3/uL (ref 0.00–0.07)
Basophils Absolute: 0.1 10*3/uL (ref 0.0–0.1)
Basophils Relative: 1 %
Eosinophils Absolute: 0.3 10*3/uL (ref 0.0–0.5)
Eosinophils Relative: 5 %
HCT: 35.3 % — ABNORMAL LOW (ref 36.0–46.0)
Hemoglobin: 11.5 g/dL — ABNORMAL LOW (ref 12.0–15.0)
Immature Granulocytes: 1 %
Lymphocytes Relative: 28 %
Lymphs Abs: 2 10*3/uL (ref 0.7–4.0)
MCH: 31.8 pg (ref 26.0–34.0)
MCHC: 32.6 g/dL (ref 30.0–36.0)
MCV: 97.5 fL (ref 80.0–100.0)
Monocytes Absolute: 0.7 10*3/uL (ref 0.1–1.0)
Monocytes Relative: 9 %
Neutro Abs: 4 10*3/uL (ref 1.7–7.7)
Neutrophils Relative %: 56 %
Platelets: 324 10*3/uL (ref 150–400)
RBC: 3.62 MIL/uL — ABNORMAL LOW (ref 3.87–5.11)
RDW: 12.8 % (ref 11.5–15.5)
WBC: 7.1 10*3/uL (ref 4.0–10.5)
nRBC: 0 % (ref 0.0–0.2)

## 2019-07-03 LAB — COMPREHENSIVE METABOLIC PANEL
ALT: 57 U/L — ABNORMAL HIGH (ref 0–44)
AST: 43 U/L — ABNORMAL HIGH (ref 15–41)
Albumin: 2.9 g/dL — ABNORMAL LOW (ref 3.5–5.0)
Alkaline Phosphatase: 134 U/L — ABNORMAL HIGH (ref 38–126)
Anion gap: 7 (ref 5–15)
BUN: 17 mg/dL (ref 8–23)
CO2: 22 mmol/L (ref 22–32)
Calcium: 8.9 mg/dL (ref 8.9–10.3)
Chloride: 105 mmol/L (ref 98–111)
Creatinine, Ser: 1.23 mg/dL — ABNORMAL HIGH (ref 0.44–1.00)
GFR calc Af Amer: 54 mL/min — ABNORMAL LOW (ref >60)
GFR calc non Af Amer: 47 mL/min — ABNORMAL LOW (ref >60)
Glucose, Bld: 196 mg/dL — ABNORMAL HIGH (ref 70–99)
Potassium: 4.4 mmol/L (ref 3.5–5.1)
Sodium: 134 mmol/L — ABNORMAL LOW (ref 135–145)
Total Bilirubin: 0.8 mg/dL (ref 0.3–1.2)
Total Protein: 6.7 g/dL (ref 6.5–8.1)

## 2019-07-03 LAB — ACETAMINOPHEN LEVEL: Acetaminophen (Tylenol), Serum: <10 ug/mL — ABNORMAL LOW (ref 10–30)

## 2019-07-03 LAB — SALICYLATE LEVEL: Salicylate Lvl: <7 mg/dL — ABNORMAL LOW (ref 7.0–30.0)

## 2019-07-03 LAB — CBG MONITORING, ED: Glucose-Capillary: 155 mg/dL — ABNORMAL HIGH (ref 70–99)

## 2019-07-03 LAB — RESPIRATORY PANEL BY RT PCR (FLU A&B, COVID)
Influenza A by PCR: NEGATIVE
Influenza B by PCR: NEGATIVE
SARS Coronavirus 2 by RT PCR: NEGATIVE

## 2019-07-03 LAB — ETHANOL: Alcohol, Ethyl (B): <10 mg/dL (ref <10)

## 2019-07-03 MED ORDER — FERROUS SULFATE 325 (65 FE) MG PO TABS
325.0000 mg | ORAL_TABLET | Freq: Every day | ORAL | Status: DC
  Administered 2019-07-03 – 2019-07-04 (×2): 325 mg via ORAL
  Filled 2019-07-03 (×2): qty 1

## 2019-07-03 MED ORDER — DIVALPROEX SODIUM 125 MG PO DR TAB
125.0000 mg | DELAYED_RELEASE_TABLET | Freq: Every day | ORAL | Status: DC
  Administered 2019-07-03 – 2019-07-04 (×2): 125 mg via ORAL
  Filled 2019-07-03 (×2): qty 1

## 2019-07-03 MED ORDER — ASPIRIN EC 325 MG PO TBEC
325.0000 mg | DELAYED_RELEASE_TABLET | Freq: Every day | ORAL | Status: DC
  Administered 2019-07-03 – 2019-07-04 (×2): 325 mg via ORAL
  Filled 2019-07-03 (×2): qty 1

## 2019-07-03 MED ORDER — BUSPIRONE HCL 10 MG PO TABS
30.0000 mg | ORAL_TABLET | Freq: Two times a day (BID) | ORAL | Status: DC
  Administered 2019-07-03 – 2019-07-04 (×2): 30 mg via ORAL
  Filled 2019-07-03 (×2): qty 3

## 2019-07-03 MED ORDER — ATORVASTATIN CALCIUM 10 MG PO TABS
20.0000 mg | ORAL_TABLET | Freq: Every day | ORAL | Status: DC
  Administered 2019-07-03: 20 mg via ORAL
  Filled 2019-07-03: qty 2

## 2019-07-03 MED ORDER — PANTOPRAZOLE SODIUM 40 MG PO TBEC
40.0000 mg | DELAYED_RELEASE_TABLET | Freq: Every day | ORAL | Status: DC
  Administered 2019-07-03 – 2019-07-04 (×2): 40 mg via ORAL
  Filled 2019-07-03 (×2): qty 1

## 2019-07-03 MED ORDER — GABAPENTIN 600 MG PO TABS
1200.0000 mg | ORAL_TABLET | Freq: Three times a day (TID) | ORAL | Status: DC
  Administered 2019-07-03 – 2019-07-04 (×3): 1200 mg via ORAL
  Filled 2019-07-03 (×3): qty 2

## 2019-07-03 MED ORDER — QUETIAPINE FUMARATE 50 MG PO TABS
50.0000 mg | ORAL_TABLET | Freq: Every day | ORAL | Status: DC
  Administered 2019-07-03: 50 mg via ORAL
  Filled 2019-07-03: qty 1

## 2019-07-03 MED ORDER — METFORMIN HCL ER 500 MG PO TB24: 500.0000 mg | ORAL_TABLET | Freq: Two times a day (BID) | ORAL | Status: DC

## 2019-07-03 MED ORDER — HYDROXYZINE HCL 25 MG PO TABS
25.0000 mg | ORAL_TABLET | Freq: Once | ORAL | Status: AC
  Administered 2019-07-03: 25 mg via ORAL
  Filled 2019-07-03: qty 1

## 2019-07-03 NOTE — Progress Notes (Signed)
Caroline Sauger, NP recommends social work be consulted for the pt in order to address daily living needs but she does not meet criteria for inpt hospitalization. EDP Henderly, Britni A, PA-C and Greta Doom, RN have been advised.

## 2019-07-03 NOTE — ED Notes (Signed)
CBG collected. Result "155." RN, Deneise Lever, notified.

## 2019-07-03 NOTE — BH Assessment (Signed)
Tele Assessment Note   Patient Name: Dana [name] MRN: [id number] Referring Physician: Nettie Elm, PA-C Location of Patient: [facility] Location of Provider: [place] Department  Dana [name] is an 64 y.o. female who presents to the ED voluntarily. Pt reports she stopped taking her medications (Seroquel, Buspar) 1 week ago because she "got sick and just stopped taking it." Pt states she cannot do for herself. Pt states she lives with her mother and her sister and her son is her POA. Pt denies SI, HI, and AVH. Pt states she does not have a mental health provider but her PCP Dana [name]) recommended she come to the ED to be evaluated because she has been "feeling out of whack." Pt states she has not been sleeping well and has increased depression ever since she stopped taking her medication.  TTS attempted to contact the pt's POA, Dana [name], [phone] but did not receive an answer. A HIPAA compliant v/m was left. TTS contacted the pt's sister whom she lives with in order to obtain collateral information. Pt's sister reports the pt "sits in the chair and just sleeps all day and won't do anything." Sister reports the pt used to have a psychiatrist but they retired 4 months ago and the pt was given a list of resources from her PCP in order to find a new psychiatrist but the pt has not followed up. Sister reports the pt does not bathe, scratches herself, and does not take her medication. She also reports the pt is supposed to use a cane or walker but she refuses and tries to get around the house without it and will end up falling and they have to help her back up. Sister reports she is concerned about the pt's ability to care for herself.  Dana Sauger, NP recommends social work be consulted for the pt in order to address daily living needs but she does not meet criteria for inpt hospitalization. EDP Henderly, Britni A, PA-C and Dana Doom, RN have been advised.    Diagnosis: Bipolar d/o  Past Medical History:  Past Medical History:  Diagnosis Date  . Bipolar 1 disorder ([place])   . Cellulitis and abscess of foot 03/08/2015  . Diabetes mellitus    INSULIN DEPENDENT  . Diabetic neuropathy ([place])   . 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 H/O hiatal hernia   . Hyperlipidemia   . Hypertension    past hx of  . Iron malabsorption 09/26/2015  . Numbness and tingling    Hx; of in B/LLE and B/LUE  . Other iron deficiency anemias 09/20/2015    Past Surgical History:  Procedure Laterality Date  . ABDOMINAL HYSTERECTOMY    . ADENOIDECTOMY     Hx: of  . AMPUTATION  10/24/2011   Procedure: AMPUTATION RAY;  Surgeon: Newt Minion, MD;  Location: [facility];  Service: Orthopedics;  Laterality: Right;  Right Foot 3rd Ray Amputation  . AMPUTATION Right 12/16/2012   Procedure: Right Foot Transmetatarsal Amputation;  Surgeon: Newt Minion, MD;  Location: [facility];  Service: Orthopedics;  Laterality: Right;  . AMPUTATION Left 03/09/2015   Procedure: LEFT FOOT 1ST RAY AMPUTATION;  Surgeon: Newt Minion, MD;  Location: [facility];  Service: Orthopedics;  Laterality: Left;  . BLADDER SURGERY     x 2, tacked 1st time; mesh "eroded", had to be removed  . Bladder Tact   2002  . BREAST SURGERY Left    "knot removed"  . CARPAL TUNNEL  RELEASE Left   . COLON SURGERY    . EYE SURGERY  11/17/2017  . NASAL SEPTUM SURGERY  1976  . TONSILLECTOMY     age 15's    Family History:  Family History  Problem Relation Age of Onset  . Diabetes Mother   . Mental illness Mother   . Bipolar disorder Mother   . Hypertension Father   . Osteoarthritis Father   . Heart disease Father   . Diabetes Sister   . Early death Brother        MVA  . Healthy Sister   . Diabetes Maternal Grandmother     Social History:  reports that she has never smoked. She has never used smokeless tobacco. She reports that she does not drink alcohol or use drugs.  Additional Social History:  Alcohol  / Drug Use Pain Medications: See MAR Prescriptions: See MAR Over the Counter: See MAR History of alcohol / drug use?: No history of alcohol / drug abuse  CIWA: CIWA-Ar BP: 137/72 Pulse Rate: 85 COWS:    Allergies:  Allergies  Allergen Reactions  . Keflex [Cephalexin]     Tremors, rash, hard to breathe  . Lithium Nausea And Vomiting and Other (See Comments)    Other reaction(s): Other (See Comments) Can not keep this medication down. It makes her terribly ill. Can not keep this medication down. It makes her terribly ill.  . Ativan [Lorazepam] Other (See Comments)    Other reaction(s): ANAPHYLAXIS Other reaction(s): Other (See Comments) Abnormal behavior Abnormal behavior  . Demerol [Meperidine] Other (See Comments)    Abnormal behavior (sees things) Other reaction(s): ANAPHYLAXIS Other reaction(s): Other (See Comments) Abnormal behavior Abnormal behavior  . Oxycodone Other (See Comments)    Other reaction(s): OTHER Abnormal behavior  . Codeine Itching and Other (See Comments)    hallucinations  . Doxycycline Other (See Comments)    Severe muscle tremor  . Darvocet [Propoxyphene N-Acetaminophen] Itching  . Latex Itching    Other reaction(s): OTHER  . Propoxyphene Itching    Home Medications: (Not in a hospital admission)   OB/GYN Status:  No LMP recorded. Patient has had a hysterectomy.  General Assessment Data Assessment unable to be completed: Yes Reason for not completing assessment: TTS calling cart, no answer Location of Assessment: [facility] ED TTS Assessment: In system Is this a Tele or Face-to-Face Assessment?: Tele Assessment Is this an Initial Assessment or a Re-assessment for this encounter?: Initial Assessment Patient Accompanied by:: N/A Language Other than English: No Living Arrangements: Other (Comment) What gender do you identify as?: Female Marital status: Married Pregnancy Status: No Living Arrangements: Parent, Other relatives Can pt return to  current living arrangement?: Yes Admission Status: Voluntary Is patient capable of signing voluntary admission?: Yes Referral Source: Self/Family/Friend Insurance type: Equities trader     Crisis Care Plan Living Arrangements: Parent, Other relatives Name of Psychiatrist: none Name of Therapist: none  Education Status Is patient currently in school?: No Is the patient employed, unemployed or receiving disability?: Receiving disability income  Risk to self with the past 6 months Suicidal Ideation: No Has patient been a risk to self within the past 6 months prior to admission? : No Suicidal Intent: No Has patient had any suicidal intent within the past 6 months prior to admission? : No Is patient at risk for suicide?: No Suicidal Plan?: No Has patient had any suicidal plan within the past 6 months prior to admission? : No Access to Means: No What has  been your use of drugs/alcohol within the last 12 months?: denies Previous Attempts/Gestures: No Triggers for Past Attempts: None known Intentional Self Injurious Behavior: None Family Suicide History: No Recent stressful life event(s): Other (Comment)(stopped taking meds) Persecutory voices/beliefs?: No Depression: Yes Depression Symptoms: Despondent, Feeling angry/irritable Substance abuse history and/or treatment for substance abuse?: No Suicide prevention information given to non-admitted patients: Not applicable  Risk to Others within the past 6 months Homicidal Ideation: No Does patient have any lifetime risk of violence toward others beyond the six months prior to admission? : No Thoughts of Harm to Others: No Current Homicidal Intent: No Current Homicidal Plan: No Access to Homicidal Means: No History of harm to others?: No Assessment of Violence: None Noted Does patient have access to weapons?: No Criminal Charges Pending?: No Does patient have a court date: No Is patient on probation?:  No  Psychosis Hallucinations: None noted Delusions: None noted  Mental Status Report Appearance/Hygiene: In hospital gown Eye Contact: Good Motor Activity: Freedom of movement Speech: Logical/coherent Level of Consciousness: Alert Mood: Depressed Affect: Flat Anxiety Level: None Thought Processes: Relevant, Coherent Judgement: Partial Orientation: Person, Place, Time Obsessive Compulsive Thoughts/Behaviors: None  Cognitive Functioning Concentration: Normal Memory: Recent Intact, Remote Intact Is patient IDD: No Insight: Fair Impulse Control: Good Appetite: Good Have you had any weight changes? : No Change Sleep: Decreased Total Hours of Sleep: 5 Vegetative Symptoms: None  ADLScreening [facility] Assessment Services) Patient's cognitive ability adequate to safely complete daily activities?: Yes Patient able to express need for assistance with ADLs?: Yes Independently performs ADLs?: No  Prior Inpatient Therapy Prior Inpatient Therapy: No  Prior Outpatient Therapy Prior Outpatient Therapy: Yes Prior Therapy Dates: 2020 Prior Therapy Facilty/Provider(s): [facility] Reason for Treatment: med management Does patient have an ACCT team?: No Does patient have Intensive In-House Services?  : No Does patient have [facility] services? : No Does patient have P4CC services?: No  ADL Screening (condition at time of admission) Patient's cognitive ability adequate to safely complete daily activities?: Yes Is the patient deaf or have difficulty hearing?: No Does the patient have difficulty seeing, even when wearing glasses/contacts?: Yes Does the patient have difficulty concentrating, remembering, or making decisions?: Yes Patient able to express need for assistance with ADLs?: Yes Does the patient have difficulty dressing or bathing?: Yes Independently performs ADLs?: No Communication: Independent Dressing (OT): Independent Grooming: Needs assistance Is this a change from baseline?:  Pre-admission baseline Feeding: Independent Bathing: Needs assistance Is this a change from baseline?: Pre-admission baseline Toileting: Needs assistance Is this a change from baseline?: Pre-admission baseline In/Out Bed: Needs assistance Is this a change from baseline?: Pre-admission baseline Walks in Home: Needs assistance Is this a change from baseline?: Pre-admission baseline Does the patient have difficulty walking or climbing stairs?: Yes Weakness of Legs: Both Weakness of Arms/Hands: Both  Home Assistive Devices/Equipment Home Assistive Devices/Equipment: Environmental consultant (specify type), Prosthesis, Cane (specify quad or straight), Eyeglasses    Abuse/Neglect Assessment (Assessment to be complete while patient is alone) Abuse/Neglect Assessment Can Be Completed: Yes Physical Abuse: Denies Verbal Abuse: Denies Sexual Abuse: Denies Exploitation of patient/patient's resources: Denies Self-Neglect: Denies     Regulatory affairs officer (For Healthcare) Does Patient Have a Medical Advance Directive?: Yes Type of Advance Directive: Healthcare Power of Attorney(son, Dana [name]) [place] in Chart?: No - copy requested          Disposition: Dana Sauger, NP recommends social work be consulted for the pt in order to address daily  living needs but she does not meet criteria for inpt hospitalization. EDP Henderly, Britni A, PA-C and Dana Doom, RN have been advised.  Disposition Initial Assessment Completed for this Encounter: Yes Patient referred to: Social Work  This service was provided via telemedicine using a 2-way, Haematologist.  Names of all persons participating in this telemedicine service and their role in this encounter. Name:  Merari B [name] Role: Patient  Name: [name] Role: Sister  Name: Lind Covert Role: TTS       Lyanne Co 07/03/2019 9:16 PM

## 2019-07-03 NOTE — ED Notes (Signed)
Pt in x-ray at this time

## 2019-07-03 NOTE — Progress Notes (Signed)
Cart not working. IT ticket placed [phone]

## 2019-07-03 NOTE — ED Provider Notes (Signed)
[facility] EMERGENCY DEPARTMENT Provider Note   CSN: [id number] Arrival date & time: 07/03/19  1255   History Chief Complaint  Patient presents with  . Psychiatric Evaluation   Dana [name] is a 64 y.o. female with past medical history difficult for bipolar, diabetes, neuropathy who presents for evaluation of depression.  States she has not been taking her medications over the last 2 weeks due to being depressed.  States all she wants to do is sleep.  She denies any pain, fever or chills.  Has had decreased appetite at home.  Was given outpatient resources by PCP for psychiatry due to depression however patient states she is too depressed to follow-up outpatient.  States losing home and not having her husband there has additional stressors.  Headache, dizziness, lightheadedness, nausea, vomiting, abdominal pain, rashes or lesions. Denies SI, HI, AVH.   Lives with sister.  See below for collateral information.  HPI   Collateral information from sister Dana [name] [phone].  Patient currently lives with her due to patient recently losing her house to foreclosure.  Patient has history of bipolar and depression.  Has refused to follow-up outpatient for depression.  States patient sleeps all day, does not take her medications and does not participate in life with daily activities.  States patient will lay around naked.  PCP has tried to set up home health outpatient however sister states patient has become manipulative and will not answer the door or the phone when they tried to come.  Sister is concerned that patient's depression has been worsening and she is refusing to follow-up outpatient or take her medications.  Sister states patient has "seems like she has given up."  She was previously seen in [place] for psychiatry however has not been there for "years."  CP has been trying different medications without help with her depression.  Sister apparently has been having to take  time off work to take care of her patient because she does not feel safe leaving her at home due to her depression    Past Medical History:  Diagnosis Date  . Bipolar 1 disorder ([place])   . Cellulitis and abscess of foot 03/08/2015  . Diabetes mellitus    INSULIN DEPENDENT  . Diabetic neuropathy ([place])   . 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 H/O hiatal hernia   . Hyperlipidemia   . Hypertension    past hx of  . Iron malabsorption 09/26/2015  . Numbness and tingling    Hx; of in B/LLE and B/LUE  . Other iron deficiency anemias 09/20/2015    Patient Active Problem List   Diagnosis Date Noted  . Partial nontraumatic amputation of left foot ([place]) 04/28/2017  . Acquired absence of right leg below knee ([place]) 04/14/2017  . Pleural effusion 01/19/2017  . Contracture of left Achilles tendon 01/16/2017  . Allergic rhinitis 01/03/2017  . Gastroesophageal reflux disease without esophagitis 01/03/2017  . Hiatal hernia 02/03/2016  . Anemia associated with diabetes mellitus ([place]) 11/07/2015  . Iron malabsorption 09/26/2015  . Other iron deficiency anemia 09/20/2015  . 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 Diabetic retinopathy associated with diabetes mellitus due to underlying condition ([place]) 03/15/2015  . DM (diabetes mellitus) type II uncontrolled with eye manifestation ([place]) 11/08/2014  . Hyperlipidemia LDL goal <100 11/08/2014  . Diabetic polyneuropathy associated with type 2 diabetes mellitus ([place]) 11/08/2014  . Partial nontraumatic amputation of foot ([place]) 11/08/2014  . Type 2 diabetes mellitus with diabetic polyneuropathy, with long-term current  use of insulin ([place]) 10/24/2011  . Bipolar 1 disorder ([place]) 10/24/2011    Past Surgical History:  Procedure Laterality Date  . ABDOMINAL HYSTERECTOMY    . ADENOIDECTOMY     Hx: of  . AMPUTATION  10/24/2011   Procedure: AMPUTATION RAY;  Surgeon: Newt Minion, MD;  Location: [facility];  Service: Orthopedics;  Laterality:  Right;  Right Foot 3rd Ray Amputation  . AMPUTATION Right 12/16/2012   Procedure: Right Foot Transmetatarsal Amputation;  Surgeon: Newt Minion, MD;  Location: [facility];  Service: Orthopedics;  Laterality: Right;  . AMPUTATION Left 03/09/2015   Procedure: LEFT FOOT 1ST RAY AMPUTATION;  Surgeon: Newt Minion, MD;  Location: [facility];  Service: Orthopedics;  Laterality: Left;  . BLADDER SURGERY     x 2, tacked 1st time; mesh "eroded", had to be removed  . Bladder Tact   2002  . BREAST SURGERY Left    "knot removed"  . CARPAL TUNNEL RELEASE Left   . COLON SURGERY    . EYE SURGERY  11/17/2017  . NASAL SEPTUM SURGERY  1976  . TONSILLECTOMY     age 25's     OB History   No obstetric history on file.     Family History  Problem Relation Age of Onset  . Diabetes Mother   . Mental illness Mother   . Bipolar disorder Mother   . Hypertension Father   . Osteoarthritis Father   . Heart disease Father   . Diabetes Sister   . Early death Brother        MVA  . Healthy Sister   . Diabetes Maternal Grandmother     Social History   Tobacco Use  . Smoking status: Never Smoker  . Smokeless tobacco: Never Used  Substance Use Topics  . Alcohol use: No    Alcohol/week: 0.0 standard drinks  . Drug use: No    Home Medications Prior to Admission medications   Medication Sig Start Date End Date Taking? Authorizing Provider  aspirin 325 MG EC tablet Take 325 mg by mouth daily.    [provider]  atorvastatin (LIPITOR) 20 MG tablet Take 1 tablet (20 mg total) by mouth at bedtime. Patient not taking: Reported on 06/30/2019 07/20/18   Scot Jun, FNP  BD VEO INSULIN SYRINGE U/F 31G X 15/64" 1 ML MISC Administer insulin up 3 times per day per sliding scale E.11.9 07/20/18   Scot Jun, FNP  busPIRone (BUSPAR) 10 MG tablet Take 30 mg by mouth in the morning and at bedtime. 06/01/19   [provider]  divalproex (DEPAKOTE) 125 MG DR tablet Take 125 mg by mouth daily.  06/09/19   [provider]  doxycycline (VIBRA-TABS) 100 MG tablet Take 100 mg by mouth 2 (two) times daily. 06/24/19   [provider]  Ferrous Sulfate (IRON) 325 (65 Fe) MG TABS Take 325 mg by mouth daily.     [provider]  fluticasone (FLONASE) 50 MCG/ACT nasal spray USE 2 SPRAY(S) IN EACH NOSTRIL ONCE DAILY Patient not taking: Reported on 06/30/2019 07/26/18   Scot Jun, FNP  furosemide (LASIX) 20 MG tablet TAKE 1 TABLET BY MOUTH TWICE DAILY Patient taking differently: Take 20 mg by mouth 2 (two) times daily.  02/24/18   Brunetta Jeans, PA-C  gabapentin (NEURONTIN) 600 MG tablet Take 2 tablets (1,200 mg total) by mouth 3 (three) times daily. 07/20/18   Scot Jun, FNP  glucose blood (FREESTYLE  LITE) test strip USE AS INSTRUCTED TO CHECK BLOOD SUGAR 3 TIMES DAILY. DX:E11.65 07/20/18   Scot Jun, FNP  insulin regular (NOVOLIN R,HUMULIN R) 100 units/mL injection Administer per sliding scale provided Patient taking differently: Inject 1-15 Units into the skin 3 (three) times daily before meals. Sliding scale 07/20/18   Scot Jun, FNP  Lancets (FREESTYLE) lancets Use as instructed to check blood sugar 3 times per day dx code E11.39 07/20/18   Scot Jun, FNP  metFORMIN (GLUCOPHAGE-XR) 500 MG 24 hr tablet TAKE 2 TABLETS IN THE MORNING AND 2 TABLETS AT NIGHT. Patient not taking: Reported on 06/30/2019 12/09/17   Elayne Snare, MD  NOVOLIN N RELION 100 UNIT/ML injection INJECT 60 UNITS AM and 50 units PM SUBCUTANEOUSLY TWICE DAILY FOR 30 DAYS Patient taking differently: Inject 75 Units into the skin 2 (two) times daily before a meal.  07/20/18   Scot Jun, FNP  Ostomy Supplies (SKIN TAC ADHESIVE BARRIER WIPE) MISC 1 each by Does not apply route daily. 11/25/17   Elayne Snare, MD  pantoprazole (PROTONIX) 40 MG tablet Take 1 tablet (40 mg total) by mouth daily. 07/20/18   Scot Jun, FNP  QUEtiapine (SEROQUEL) 50 MG tablet Take  50 mg by mouth at bedtime.     [provider]    Allergies    Keflex [cephalexin], Lithium, Ativan [lorazepam], Demerol [meperidine], Oxycodone, Codeine, Doxycycline, Darvocet [propoxyphene n-acetaminophen], Latex, and Propoxyphene  Review of Systems   Review of Systems  Constitutional: Negative.   HENT: Negative.   Respiratory: Negative.   Cardiovascular: Negative.   Gastrointestinal: Negative.   Genitourinary: Negative.   Musculoskeletal: Negative.   Skin: Positive for wound.  Psychiatric/Behavioral: Positive for decreased concentration and sleep disturbance. Negative for behavioral problems, confusion, hallucinations and self-injury. The patient is not nervous/anxious.   All other systems reviewed and are negative.   Physical Exam Updated Vital Signs BP 137/72   Pulse 85   Temp 98 F (36.7 C) (Oral)   Resp 12   Ht 5\' 2"  (1.575 m)   Wt 104.3 kg   SpO2 99%   BMI 42.07 kg/m   Physical Exam Vitals and nursing note reviewed.  Constitutional:      General: She is not in acute distress.    Appearance: She is well-developed. She is ill-appearing (Chronically ill appearing). She is not toxic-appearing.  HENT:     Head: Atraumatic.  Eyes:     Pupils: Pupils are equal, round, and reactive to light.  Cardiovascular:     Rate and Rhythm: Normal rate.     Pulses: Normal pulses.     Heart sounds: Normal heart sounds.  Pulmonary:     Effort: Pulmonary effort is normal. No respiratory distress.     Breath sounds: Normal breath sounds.  Abdominal:     General: Bowel sounds are normal. There is no distension.  Musculoskeletal:        General: Normal range of motion.     Cervical back: Normal range of motion.     Comments: BKA to right lower extremity.  Patient with stage II pressure wound to left calcaneus.  Prior ray amputation.  Aspirated skin however no fluctuance, induration, erythema or warmth.  Skin:    General: Skin is warm and dry.     Capillary Refill:  Capillary refill takes less than 2 seconds.  Neurological:     Mental Status: She is alert.  Psychiatric:     Comments: Depressed.  Flat  affect.  Denies SI, HI, AVH.    ED Results / Procedures / Treatments   Labs (all labs ordered are listed, but only abnormal results are displayed) Labs Reviewed  COMPREHENSIVE METABOLIC PANEL - Abnormal; Notable for the following components:      Result Value   Sodium 134 (*)    Glucose, Bld 196 (*)    Creatinine, Ser 1.23 (*)    Albumin 2.9 (*)    AST 43 (*)    ALT 57 (*)    Alkaline Phosphatase 134 (*)    GFR calc non Af Amer 47 (*)    GFR calc Af Amer 54 (*)    All other components within normal limits  CBC WITH DIFFERENTIAL/PLATELET - Abnormal; Notable for the following components:   RBC 3.62 (*)    Hemoglobin 11.5 (*)    HCT 35.3 (*)    All other components within normal limits  SALICYLATE LEVEL - Abnormal; Notable for the following components:   Salicylate Lvl <9.3 (*)    All other components within normal limits  ACETAMINOPHEN LEVEL - Abnormal; Notable for the following components:   Acetaminophen (Tylenol), Serum <10 (*)    All other components within normal limits  CBG MONITORING, ED - Abnormal; Notable for the following components:   Glucose-Capillary 155 (*)    All other components within normal limits  RESPIRATORY PANEL BY RT PCR (FLU A&B, COVID)  ETHANOL  RAPID URINE DRUG SCREEN, HOSP PERFORMED  URINALYSIS, ROUTINE W REFLEX MICROSCOPIC    EKG EKG Interpretation  Date/Time:  Sunday July 03 2019 14:05:42 EST Ventricular Rate:  80 PR Interval:    QRS Duration: 141 QT Interval:  414 QTC Calculation: 478 R Axis:   -55 Text Interpretation: Sinus rhythm RBBB and LAFB Left ventricular hypertrophy Inferior infarct, old Confirmed by Fredia Sorrow [phone]) on 07/03/2019 2:16:18 PM   Radiology DG Foot Complete Left  Result Date: 07/03/2019 CLINICAL DATA:  Ulcer on the calcaneus for a few months. Diabetic patient.  History of prior amputations. EXAM: LEFT FOOT - COMPLETE 3+ VIEW COMPARISON:  Plain films of the left foot 11/11/2019 and 02/14/2019. FINDINGS: Soft tissues are diffusely swollen. No bony destructive change or periosteal reaction. Charcot change of the midfoot is identified. Large plantar calcaneal spur is seen. The first ray has been amputated. Dislocated second toe is unchanged. Sclerosis and thickening of the second metatarsal consistent with chronic osteomyelitis are also unchanged. IMPRESSION: Marked soft tissue swelling.  No evidence of acute osteomyelitis. Status post amputation of the first ray. Findings consistent with chronic osteomyelitis of the second metatarsal, unchanged. Chronically dislocated second toe. Electronically Signed   By: Inge Rise M.D.   On: 07/03/2019 14:03    Procedures Procedures (including critical care time)  Medications Ordered in ED Medications  aspirin EC tablet 325 mg (has no administration in time range)  atorvastatin (LIPITOR) tablet 20 mg (has no administration in time range)  busPIRone (BUSPAR) tablet 30 mg (has no administration in time range)  divalproex (DEPAKOTE) DR tablet 125 mg (has no administration in time range)  Iron TABS 325 mg (has no administration in time range)  gabapentin (NEURONTIN) tablet 1,200 mg (has no administration in time range)  metFORMIN (GLUCOPHAGE-XR) 24 hr tablet 500 mg (has no administration in time range)  pantoprazole (PROTONIX) EC tablet 40 mg (has no administration in time range)  QUEtiapine (SEROQUEL) tablet 50 mg (has no administration in time range)  hydrOXYzine (ATARAX/VISTARIL) tablet 25 mg (25 mg Oral Given 07/03/19  [email or web address])    ED Course  I have reviewed the triage vital signs and the nursing notes.  Pertinent labs & imaging results that were available during my care of the patient were reviewed by me and considered in my medical decision making (see chart for details).  64 year old female with complex medical  history presents for evaluation of needing psychiatric consult.  History of depression and bipolar.  According to sister whom patient lives with she has had increased depression, she is not performing her ADLs, taking her medications at home.  Patient apparently well lay around naked and sleep all day.  Patient denies any SI, HI, AVH however does admit to depression.  Patient sister is afraid that patient has "given up."  Patient states some financial stressors as well as losing her home.  Has been given outpatient resources and psychiatric medications by PCP however patient is not taking these and has not followed up outpatient.  Plan for medical clearance.  He does have known history of chronic osteomyelitis in her left lower extremity.  She has stage II pressure ulcer to her calcaneus.  Clinical Course as of Jul 02 2104  Nancy Fetter Jul 03, 2019  1415 Prior Amputation, mild soft tissue swelling, chronic osteomyelitis, similar to previous digit however no new osteo to calcaneus.  DG Foot Complete Left [BH]  1415 No leukocytosis, hemoglobin 11.5  CBC with Diff(!) [BH]  1415 Mild hyponatremia to 134, glucose 196, creatinine 123, mild elevation in LFTs however patient denies any pain.  Low suspicion for cholecystitis, choledocholithiasis, CBD obstruction  Comprehensive metabolic panel(!) [BH]  8938 Negative  Respiratory Panel by RT PCR (Flu A&B, Covid) - Nasopharyngeal Swab [BH]  1524 Similar to previous. No STEMI  EKG 12-Lead [BH]    Clinical Course User Index [BH] Nelline Lio A, PA-C    Patient medically cleared. Awaiting TTS consult for disposition.  1800: TTS has been consulted. They are aware of patient and patient is on list to be assessed. Patient refuses home meds here in ED.  Psych with recommendations for dc home does not meet inpatient criteria.  Patient continues to deny SI, HI, AVH.  Discussed with patient contact in [place]. She refused to take patient home states patient need an  assisted living facility. Pam curses at this provider many times and stay she is absolutely not picking patient up to go home. Stating we "dont do anything to help anyone."Patient currently resides with sister.   Attempted to contact patients son at [phone] however he has not been able to be reached x 2. Patient will board until placement found due to families refusal to take patient home. TOC order placed here in ED however social work and case management not available currently.  Hold orders place with home meds. Patient gust with attending physician, Dr. Rogene Houston who agrees with above treatment, plan and disposition.   MDM Rules/Calculators/A&P                       Final Clinical Impression(s) / ED Diagnoses Final diagnoses:  Depression, unspecified depression type    Rx / DC Orders ED Discharge Orders    None       Valissa Lyvers A, PA-C 07/03/19 2106    Fredia Sorrow, MD 07/05/19 1539

## 2019-07-03 NOTE — ED Triage Notes (Signed)
Pt arrives POV for psych eval. Pt reports she has been off her psych meds for 1 week and "doesn't feel good". Pt denies SI/HI to this Probation officer, states she just needs to be evaluated for depression since stopping her meds. Of note, pt was seen earlier this week here for same, as well as [facility] for same the night of 2/16. NAD in triage, calm/cooperative.

## 2019-07-03 NOTE — ED Notes (Signed)
Pt back from x-ray.

## 2019-07-04 DIAGNOSIS — F29 Unspecified psychosis not due to a substance or known physiological condition: Secondary | ICD-10-CM | POA: Diagnosis not present

## 2019-07-04 DIAGNOSIS — Z046 Encounter for general psychiatric examination, requested by authority: Secondary | ICD-10-CM | POA: Diagnosis not present

## 2019-07-04 DIAGNOSIS — R279 Unspecified lack of coordination: Secondary | ICD-10-CM | POA: Diagnosis not present

## 2019-07-04 DIAGNOSIS — R4182 Altered mental status, unspecified: Secondary | ICD-10-CM | POA: Diagnosis not present

## 2019-07-04 DIAGNOSIS — Z743 Need for continuous supervision: Secondary | ICD-10-CM | POA: Diagnosis not present

## 2019-07-04 NOTE — ED Notes (Signed)
Pt sleeping upright in hospital bed. Equal rise and fall of chest noted.

## 2019-07-04 NOTE — Discharge Instructions (Signed)
[facility] to follow up with you in 24-48 hours.   Please call [facility] at [phone] to request assistance with transportation services. [facility] provides services to [employer] beneficiaries.

## 2019-07-04 NOTE — Care Management (Signed)
ED CM spoke with patient's sister Dana [name] December care giver concerning ETA of picking up patient, she appeared upset but said she is on her way. CM explained that patient will be going home with [facility] and may also benefit from [facility]. CM will place the referral Sis is agreeable and would like to be point of contact. Explained that someone will contact them if patient meets criteria 4 -72 hours post discharge

## 2019-07-04 NOTE — ED Notes (Signed)
Pt updated on plan of care. Aware she is up for discharge just waiting on sisters arrival to pick her up. Pt resting comfortably in bed. Diet Coke provided.

## 2019-07-04 NOTE — ED Notes (Signed)
Discharge summary reviewed with pt. Pt verbalized understanding. No further questions for staff at this time.

## 2019-07-04 NOTE — ED Notes (Signed)
Pt resting comfortably in bed. Updated on plan of care. Snack provided.

## 2019-07-04 NOTE — Discharge Planning (Signed)
Meili Kleckley J. Clydene Laming, RN, BSN, [employer] [phone] Spoke with pt at bedside regarding discharge planning for [facility]. Offered pt list of home health agencies to choose from.  Pt chose [facility] to render services. [place] of [facility] notified. Patient made aware that [facility] will be in contact in 24-48 hours.  No DME needs identified at this time.

## 2019-07-04 NOTE — ED Provider Notes (Signed)
RN inform me that patient has been evaluated by TTS and has been cleared for discharge.  They had recommended social work consult for home health needs.  Patient currently lives with her sister.  Social work was consulted and establish home health care.  I evaluated patient.  She denies any SI, HI, auditory or visual hallucinations.  She is alert and oriented x3.  Patient appropriate for discharge at this time.  Portions of this note were generated with Lobby[employer]. Dictation errors may occur despite best attempts at proofreading.     Volanda Napoleon, PA-C 07/04/19 1801    Dorie Rank, MD 07/05/19 1300

## 2019-07-04 NOTE — ED Notes (Signed)
Pt's sister, [name], asked Franki Monte to have RN call her prior to calling [facility] for transport so someone could be home to receive the pt. Ms. Ronnald Ramp called by this RN prior to contacting [facility]. RN was asked to contact sister again when [facility] arrived because she was at work and would need to know she was on her way home so that someone could be there. Ms. Ronnald Ramp stated that if [facility] had not arrived by 4PM she would come and get her sister herself. This RN contacted Ms. Ronnald Ramp a second time when [facility] arrived to [facility] to let her know that patient was being transported home by [facility]. Ms. Ronnald Ramp stated this was okay. RN documented this exchange in chart at 12:00. Ms. Ronnald Ramp contacted RN via telephone following pt discharge and was upset and yelling stating that no one was there to receive pt and [facility] couldn't leave her without someone there. This RN attempted to deescalate Ms. Jones, however, she continued to yell and blame ED staff for pt being brought home without someone being there to get her. Ms. Ronnald Ramp put on hold in order for RN to contact charge RN for assistance; hung up before RN could return to phone. RN then contacted by [facility] regarding situation. Pt returned to ED by [facility] and roomed. Meal provided upon return.

## 2019-07-04 NOTE — ED Notes (Signed)
Pt able to dress herself and ambulate to bathroom and wheelchair in preparation for discharge. Discharge instructions reviewed with pt, understands that [facility] will be contacting her tomorrow. Sister [name]) is en route to hospital. Pt is in lobby waiting for her arrival.

## 2019-07-04 NOTE — ED Notes (Signed)
Was told by prior RN that sister [name]) would be off work at [place] sister (4:15PM) and informed her I can arrange [facility] to bring patient home now that she was off work or was told she would be able to pick her up once off work. Sister begins to cuss at me over phone stating "I need yall to fucking get it together over there" I informed sister she was not going to cuss at me, I was here to assist getting the patient discharged. Sister states "I will get there when I fucking get there" Charge RN aware

## 2019-07-04 NOTE — ED Notes (Signed)
Boarding until morning TOC eval  Breakfast ordered

## 2019-07-04 NOTE — ED Notes (Signed)
Contacted [facility] for transport

## 2019-07-05 NOTE — Patient Outreach (Signed)
Dana [name] call [place] management for help with transportation, she has [employer] insurance and followed by [facility]. Following their workflow I have sent it to [email or web address] for Care Management.

## 2019-07-11 DIAGNOSIS — E11621 Type 2 diabetes mellitus with foot ulcer: Secondary | ICD-10-CM | POA: Diagnosis not present

## 2019-07-11 DIAGNOSIS — Z794 Long term (current) use of insulin: Secondary | ICD-10-CM | POA: Diagnosis not present

## 2019-07-11 DIAGNOSIS — L97422 Non-pressure chronic ulcer of left heel and midfoot with fat layer exposed: Secondary | ICD-10-CM | POA: Diagnosis not present

## 2019-07-11 DIAGNOSIS — E1142 Type 2 diabetes mellitus with diabetic polyneuropathy: Secondary | ICD-10-CM | POA: Diagnosis not present

## 2019-07-11 DIAGNOSIS — E1165 Type 2 diabetes mellitus with hyperglycemia: Secondary | ICD-10-CM | POA: Diagnosis not present

## 2019-07-11 DIAGNOSIS — E11610 Type 2 diabetes mellitus with diabetic neuropathic arthropathy: Secondary | ICD-10-CM | POA: Diagnosis not present

## 2019-07-14 ENCOUNTER — Other Ambulatory Visit: Payer: Self-pay

## 2019-07-14 NOTE — Patient Outreach (Signed)
Incoming call received by patient in hopes to receive assistance with transportation.  CMA informed patient prior call received on 07/05/19 and referral was sent to [email or web address] per policy and procedure.  Patient advised she was more than welcomed to follow up with [facility] Conceirge team.  CMA will also send follow up to email address listed above.  [place] Management Assistant [phone]

## 2019-07-18 DIAGNOSIS — L89620 Pressure ulcer of left heel, unstageable: Secondary | ICD-10-CM | POA: Diagnosis not present

## 2019-07-21 DIAGNOSIS — E11610 Type 2 diabetes mellitus with diabetic neuropathic arthropathy: Secondary | ICD-10-CM | POA: Diagnosis not present

## 2019-07-21 DIAGNOSIS — E11621 Type 2 diabetes mellitus with foot ulcer: Secondary | ICD-10-CM | POA: Diagnosis not present

## 2019-07-21 DIAGNOSIS — L89620 Pressure ulcer of left heel, unstageable: Secondary | ICD-10-CM | POA: Diagnosis not present

## 2019-07-21 DIAGNOSIS — E1142 Type 2 diabetes mellitus with diabetic polyneuropathy: Secondary | ICD-10-CM | POA: Diagnosis not present

## 2019-07-21 DIAGNOSIS — L97429 Non-pressure chronic ulcer of left heel and midfoot with unspecified severity: Secondary | ICD-10-CM | POA: Diagnosis not present

## 2019-07-21 DIAGNOSIS — L97422 Non-pressure chronic ulcer of left heel and midfoot with fat layer exposed: Secondary | ICD-10-CM | POA: Diagnosis not present

## 2019-07-22 DIAGNOSIS — E113511 Type 2 diabetes mellitus with proliferative diabetic retinopathy with macular edema, right eye: Secondary | ICD-10-CM | POA: Diagnosis not present

## 2019-08-02 DIAGNOSIS — E11621 Type 2 diabetes mellitus with foot ulcer: Secondary | ICD-10-CM | POA: Diagnosis not present

## 2019-08-02 DIAGNOSIS — E1142 Type 2 diabetes mellitus with diabetic polyneuropathy: Secondary | ICD-10-CM | POA: Diagnosis not present

## 2019-08-02 DIAGNOSIS — L97422 Non-pressure chronic ulcer of left heel and midfoot with fat layer exposed: Secondary | ICD-10-CM | POA: Diagnosis not present

## 2019-08-02 DIAGNOSIS — Z7984 Long term (current) use of oral hypoglycemic drugs: Secondary | ICD-10-CM | POA: Diagnosis not present

## 2019-08-02 DIAGNOSIS — Z89432 Acquired absence of left foot: Secondary | ICD-10-CM | POA: Diagnosis not present

## 2019-08-02 DIAGNOSIS — Z89511 Acquired absence of right leg below knee: Secondary | ICD-10-CM | POA: Diagnosis not present

## 2019-08-02 DIAGNOSIS — E1165 Type 2 diabetes mellitus with hyperglycemia: Secondary | ICD-10-CM | POA: Diagnosis not present

## 2019-08-02 DIAGNOSIS — L89620 Pressure ulcer of left heel, unstageable: Secondary | ICD-10-CM | POA: Diagnosis not present

## 2019-08-02 DIAGNOSIS — E11610 Type 2 diabetes mellitus with diabetic neuropathic arthropathy: Secondary | ICD-10-CM | POA: Diagnosis not present

## 2019-08-02 DIAGNOSIS — E11622 Type 2 diabetes mellitus with other skin ulcer: Secondary | ICD-10-CM | POA: Diagnosis not present

## 2019-08-02 DIAGNOSIS — L97429 Non-pressure chronic ulcer of left heel and midfoot with unspecified severity: Secondary | ICD-10-CM | POA: Diagnosis not present

## 2019-08-03 ENCOUNTER — Encounter: Payer: Self-pay | Admitting: Adult Health

## 2019-08-03 ENCOUNTER — Other Ambulatory Visit: Payer: Self-pay

## 2019-08-03 ENCOUNTER — Ambulatory Visit (INDEPENDENT_AMBULATORY_CARE_PROVIDER_SITE_OTHER): Payer: PPO | Admitting: Adult Health

## 2019-08-03 VITALS — BP 128/73 | HR 110 | Ht 59.0 in | Wt 220.0 lb

## 2019-08-03 DIAGNOSIS — F331 Major depressive disorder, recurrent, moderate: Secondary | ICD-10-CM | POA: Diagnosis not present

## 2019-08-03 DIAGNOSIS — F319 Bipolar disorder, unspecified: Secondary | ICD-10-CM

## 2019-08-03 DIAGNOSIS — F411 Generalized anxiety disorder: Secondary | ICD-10-CM | POA: Diagnosis not present

## 2019-08-03 DIAGNOSIS — G4700 Insomnia, unspecified: Secondary | ICD-10-CM

## 2019-08-03 MED ORDER — BUSPIRONE HCL 10 MG PO TABS: ORAL_TABLET | ORAL | 2 refills | Status: DC

## 2019-08-03 MED ORDER — QUETIAPINE FUMARATE 100 MG PO TABS: ORAL_TABLET | ORAL | 2 refills | Status: DC

## 2019-08-03 MED ORDER — DIVALPROEX SODIUM 125 MG PO DR TAB: 125.0000 mg | DELAYED_RELEASE_TABLET | Freq: Every day | ORAL | 2 refills | Status: DC

## 2019-08-03 NOTE — Progress Notes (Signed)
[facility] MD/PA/NP Initial Note  08/03/2019 1:32 PM Dana [name]  MRN:  [id number]  Chief Complaint:   HPI:   Describes mood today as "ok". Pleasant. Mood symptoms - reports depression, anxiety, and irritability for about 3 years. Stating "I feel more depressed overall". Previous psychiatrist was Dr. Darnell Level" - recently retired. PCP has been prescribing medications. She and husband were in the hospital at the same time 3 years ago. When she and husband left the hospital her husband went into a nursing home and she went to live with her mother. Her sons made the decision for her to go her mothers because the house was in need of repairs. Stating it wasn't supposed to be a "long term thing, but it's turned out to be". Living space at mother's is limited and she cannot do some "things she likes to do". Still in control of her "finances". Feels like she could use some "help to get to feeling better". Stable interest and motivation. Taking medications as prescribed.  Energy levels stable. Active, does not have a regular exercise routine. Disabled.  Enjoys some usual interests and activities. Married x 44 years. Husband living in a nursing home for 3 years. Lives with mother and sister. Has 2 adult sons. Spending time with family. Listening to audible books.  Appetite adequate. Weight stable - 220 pounds. Sleeps well most nights with Seroquel.  Averages 8 hours. Focus and concentration "mostly" stable. Completing tasks. Managing some aspects of household.  Denies SI or HI. Denies AH or VH.  Previous medication trials: Lithium, Latuda, Lexapro, Zoloft, Prozac Wellbutrin, Effexor,   Visit Diagnosis:    ICD-10-CM   1. Bipolar I disorder ([place])  F31.9   2. Major depressive disorder, recurrent episode, moderate (H[facility])  F33.1   3. Generalized anxiety disorder  F41.1   4. Insomnia, unspecified type  G47.00     Past Psychiatric History: Denies psychiatric hospitalization.  Past Medical History:  Past  Medical History:  Diagnosis Date  . Bipolar 1 disorder ([place])   . Cellulitis and abscess of foot 03/08/2015  . Diabetes mellitus    INSULIN DEPENDENT  . Diabetic neuropathy ([place])   . 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 H/O hiatal hernia   . Hyperlipidemia   . Hypertension    past hx of  . Iron malabsorption 09/26/2015  . Numbness and tingling    Hx; of in B/LLE and B/LUE  . Other iron deficiency anemias 09/20/2015    Past Surgical History:  Procedure Laterality Date  . ABDOMINAL HYSTERECTOMY    . ADENOIDECTOMY     Hx: of  . AMPUTATION  10/24/2011   Procedure: AMPUTATION RAY;  Surgeon: Newt Minion, MD;  Location: [facility];  Service: Orthopedics;  Laterality: Right;  Right Foot 3rd Ray Amputation  . AMPUTATION Right 12/16/2012   Procedure: Right Foot Transmetatarsal Amputation;  Surgeon: Newt Minion, MD;  Location: [facility];  Service: Orthopedics;  Laterality: Right;  . AMPUTATION Left 03/09/2015   Procedure: LEFT FOOT 1ST RAY AMPUTATION;  Surgeon: Newt Minion, MD;  Location: [facility];  Service: Orthopedics;  Laterality: Left;  . BLADDER SURGERY     x 2, tacked 1st time; mesh "eroded", had to be removed  . Bladder Tact   2002  . BREAST SURGERY Left    "knot removed"  . CARPAL TUNNEL RELEASE Left   . COLON SURGERY    . EYE SURGERY  11/17/2017  . NASAL SEPTUM SURGERY  1976  . TONSILLECTOMY  age 84's    Family Psychiatric History: Mother Bipolar.  Family History:  Family History  Problem Relation Age of Onset  . Diabetes Mother   . Mental illness Mother   . Bipolar disorder Mother   . Hypertension Father   . Osteoarthritis Father   . Heart disease Father   . Diabetes Sister   . Early death Brother        MVA  . Healthy Sister   . Diabetes Maternal Grandmother     Social History:  Social History   Socioeconomic History  . Marital status: Married    Spouse name: Not on file  . Number of children: 2  . Years of education: 6  . Highest education level:  Not on file  Occupational History    Comment: disabled  Tobacco Use  . Smoking status: Never Smoker  . Smokeless tobacco: Never Used  Substance and Sexual Activity  . Alcohol use: No    Alcohol/week: 0.0 standard drinks  . Drug use: No  . Sexual activity: Never    Partners: Male  Other Topics Concern  . Not on file  Social History Narrative   Lives with husband in home   Caffeine use - Coke 2 or 3 16 oz daily   Social Determinants of Health   Financial Resource Strain:   . Difficulty of Paying Living Expenses:   Food Insecurity:   . Worried About Charity fundraiser in the Last Year:   . Arboriculturist in the Last Year:   Transportation Needs:   . Film/video editor (Medical):   Marland Kitchen Lack of Transportation (Non-Medical):   Physical Activity:   . Days of Exercise per Week:   . Minutes of Exercise per Session:   Stress:   . Feeling of Stress :   Social Connections:   . Frequency of Communication with Friends and Family:   . Frequency of Social Gatherings with Friends and Family:   . Attends Religious Services:   . Active Member of Clubs or Organizations:   . Attends Archivist Meetings:   Marland Kitchen Marital Status:     Allergies:  Allergies  Allergen Reactions  . Keflex [Cephalexin]     Tremors, rash, hard to breathe  . Lithium Nausea And Vomiting and Other (See Comments)    Other reaction(s): Other (See Comments) Can not keep this medication down. It makes her terribly ill. Can not keep this medication down. It makes her terribly ill.  . Ativan [Lorazepam] Other (See Comments)    Other reaction(s): ANAPHYLAXIS Other reaction(s): Other (See Comments) Abnormal behavior Abnormal behavior  . Demerol [Meperidine] Other (See Comments)    Abnormal behavior (sees things) Other reaction(s): ANAPHYLAXIS Other reaction(s): Other (See Comments) Abnormal behavior Abnormal behavior  . Oxycodone Other (See Comments)    Other reaction(s): OTHER Abnormal behavior  .  Codeine Itching and Other (See Comments)    hallucinations  . Doxycycline Other (See Comments)    Severe muscle tremor  . Darvocet [Propoxyphene N-Acetaminophen] Itching  . Latex Itching    Other reaction(s): OTHER  . Propoxyphene Itching    Metabolic Disorder Labs: Lab Results  Component Value Date   HGBA1C 8.9 (H) 07/20/2018   MPG 292 (H) 10/24/2011   No results found for: PROLACTIN Lab Results  Component Value Date   CHOL 140 01/07/2018   TRIG 135.0 01/07/2018   HDL 46.40 01/07/2018   CHOLHDL 3 01/07/2018   VLDL 27.0 01/07/2018   [place]  67 01/07/2018   LDLCALC 77 10/01/2016   Lab Results  Component Value Date   TSH 1.33 10/20/2017   TSH 1.02 09/25/2017    Therapeutic Level Labs: Lab Results  Component Value Date   LITHIUM 0.6 10/20/2017   LITHIUM <0.06 (L) 03/08/2015   No results found for: VALPROATE No components found for:  CBMZ  Current Medications: Current Outpatient Medications  Medication Sig Dispense Refill  . aspirin 325 MG EC tablet Take 325 mg by mouth daily.    Marland Kitchen atorvastatin (LIPITOR) 20 MG tablet Take 1 tablet (20 mg total) by mouth at bedtime. (Patient not taking: Reported on 06/30/2019) 90 tablet 1  . BD VEO INSULIN SYRINGE U/F 31G X 15/64" 1 ML MISC Administer insulin up 3 times per day per sliding scale E.11.9 200 each 6  . busPIRone (BUSPAR) 10 MG tablet Take 30 mg by mouth in the morning and at bedtime.    . Continuous Blood Gluc Sensor (FREESTYLE LIBRE 14 DAY SENSOR) MISC SMARTSIG:1 Each Topical Every 2 Weeks    . divalproex (DEPAKOTE) 125 MG DR tablet Take 125 mg by mouth daily.    Marland Kitchen doxycycline (VIBRA-TABS) 100 MG tablet Take 100 mg by mouth 2 (two) times daily.    . Ferrous Sulfate (IRON) 325 (65 Fe) MG TABS Take 325 mg by mouth daily.     . fluticasone (FLONASE) 50 MCG/ACT nasal spray USE 2 SPRAY(S) IN EACH NOSTRIL ONCE DAILY (Patient not taking: Reported on 06/30/2019) 16 g 3  . furosemide (LASIX) 20 MG tablet TAKE 1 TABLET BY MOUTH  TWICE DAILY (Patient taking differently: Take 20 mg by mouth 2 (two) times daily. ) 180 tablet 1  . gabapentin (NEURONTIN) 600 MG tablet Take 2 tablets (1,200 mg total) by mouth 3 (three) times daily. 540 tablet 0  . glucose blood (FREESTYLE LITE) test strip USE AS INSTRUCTED TO CHECK BLOOD SUGAR 3 TIMES DAILY. DX:E11.65 100 each 3  . HUMULIN R U-500 KWIKPEN 500 UNIT/ML kwikpen     . insulin regular (NOVOLIN R,HUMULIN R) 100 units/mL injection Administer per sliding scale provided (Patient taking differently: Inject 1-15 Units into the skin 3 (three) times daily before meals. Sliding scale) 10 mL 3  . JANUVIA 50 MG tablet Take 50 mg by mouth daily.    . Lancets (FREESTYLE) lancets Use as instructed to check blood sugar 3 times per day dx code E11.39 100 each 3  . metFORMIN (GLUCOPHAGE-XR) 500 MG 24 hr tablet TAKE 2 TABLETS IN THE MORNING AND 2 TABLETS AT NIGHT. (Patient not taking: Reported on 06/30/2019) 360 tablet 1  . NOVOLIN N RELION 100 UNIT/ML injection INJECT 60 UNITS AM and 50 units PM SUBCUTANEOUSLY TWICE DAILY FOR 30 DAYS (Patient taking differently: Inject 75 Units into the skin 2 (two) times daily before a meal. ) 10 mL 6  . Ostomy Supplies (SKIN TAC ADHESIVE BARRIER WIPE) MISC 1 each by Does not apply route daily. 100 each 5  . pantoprazole (PROTONIX) 40 MG tablet Take 1 tablet (40 mg total) by mouth daily. 90 tablet 0  . QUEtiapine (SEROQUEL) 50 MG tablet Take 50 mg by mouth at bedtime.      No current facility-administered medications for this visit.    Medication Side Effects: none  Orders placed this visit:  No orders of the defined types were placed in this encounter.   Psychiatric Specialty Exam:  Review of Systems  Musculoskeletal: Negative for gait problem.  Neurological: Negative for tremors.  Psychiatric/Behavioral:  Please refer to HPI    There were no vitals taken for this visit.There is no height or weight on file to calculate BMI.  General Appearance: Neat  and Well Groomed  Eye Contact:  Good  Speech:  Clear and Coherent  Volume:  Normal  Mood:  Anxious, Depressed and Irritable  Affect:  Appropriate and Congruent  Thought Process:  Coherent and Descriptions of Associations: Intact  Orientation:  Full (Time, Place, and Person)  Thought Content: Logical   Suicidal Thoughts:  No  Homicidal Thoughts:  No  Memory:  WNL  Judgement:  Good  Insight:  Good  Psychomotor Activity:  Normal  Concentration:  Concentration: Good  Recall:  Good  Fund of Knowledge: Good  Language: Good  Assets:  Communication Skills Desire for Improvement Financial Resources/Insurance Housing Intimacy Leisure Time Physical Health Resilience Social Support Talents/Skills Transportation Vocational/Educational  ADL's:  Intact  Cognition: WNL  Prognosis:  Good   Screenings:  Mini-Mental     Office Visit from 09/25/2017 in [facility]  Total Score (max 30 points )  30    PHQ2-9     Office Visit from 07/20/2018 in [facility] at [facility] Visit from 01/07/2018 in [facility] Office Visit from 10/07/2017 in [facility] Office Visit from 10/01/2016 in  Office Visit from 12/28/2015 in [facility]  PHQ-2 Total Score  2  0  2  0  0  PHQ-9 Total Score  6  0  4  0  3      Receiving Psychotherapy: No   Treatment Plan/Recommendations:   Plan:  PDMP reviewed  1. Increase Serquel 50mg  - 3 at bedtime too 100mg  - 2 at bedtime.  2. Buspar 20mg  TID 3. Depakote 125mg  at bedtime  Consider Wellbutrin  RTC 4 weeks with Dr Clovis Pu - previous patient  Patient advised to contact office with any questions, adverse effects, or acute worsening in signs and symptoms.  Discussed potential metabolic side effects associated with atypical antipsychotics, as well  as potential risk for movement side effects. Advised pt to contact office if movement side effects occur.    Aloha Gell, NP

## 2019-08-10 DIAGNOSIS — E113511 Type 2 diabetes mellitus with proliferative diabetic retinopathy with macular edema, right eye: Secondary | ICD-10-CM | POA: Diagnosis not present

## 2019-08-16 DIAGNOSIS — Z89511 Acquired absence of right leg below knee: Secondary | ICD-10-CM | POA: Diagnosis not present

## 2019-08-16 DIAGNOSIS — E11622 Type 2 diabetes mellitus with other skin ulcer: Secondary | ICD-10-CM | POA: Diagnosis not present

## 2019-08-16 DIAGNOSIS — I890 Lymphedema, not elsewhere classified: Secondary | ICD-10-CM | POA: Diagnosis not present

## 2019-08-16 DIAGNOSIS — E1165 Type 2 diabetes mellitus with hyperglycemia: Secondary | ICD-10-CM | POA: Diagnosis not present

## 2019-08-16 DIAGNOSIS — E11610 Type 2 diabetes mellitus with diabetic neuropathic arthropathy: Secondary | ICD-10-CM | POA: Diagnosis not present

## 2019-08-16 DIAGNOSIS — L97222 Non-pressure chronic ulcer of left calf with fat layer exposed: Secondary | ICD-10-CM | POA: Diagnosis not present

## 2019-08-16 DIAGNOSIS — L97422 Non-pressure chronic ulcer of left heel and midfoot with fat layer exposed: Secondary | ICD-10-CM | POA: Diagnosis not present

## 2019-08-16 DIAGNOSIS — E1142 Type 2 diabetes mellitus with diabetic polyneuropathy: Secondary | ICD-10-CM | POA: Diagnosis not present

## 2019-08-16 DIAGNOSIS — E11621 Type 2 diabetes mellitus with foot ulcer: Secondary | ICD-10-CM | POA: Diagnosis not present

## 2019-08-18 DIAGNOSIS — F411 Generalized anxiety disorder: Secondary | ICD-10-CM | POA: Diagnosis not present

## 2019-08-18 DIAGNOSIS — F3181 Bipolar II disorder: Secondary | ICD-10-CM | POA: Diagnosis not present

## 2019-08-24 DIAGNOSIS — E1140 Type 2 diabetes mellitus with diabetic neuropathy, unspecified: Secondary | ICD-10-CM | POA: Diagnosis not present

## 2019-08-24 DIAGNOSIS — E1165 Type 2 diabetes mellitus with hyperglycemia: Secondary | ICD-10-CM | POA: Diagnosis not present

## 2019-08-24 DIAGNOSIS — Z794 Long term (current) use of insulin: Secondary | ICD-10-CM | POA: Diagnosis not present

## 2019-08-25 ENCOUNTER — Other Ambulatory Visit: Payer: Self-pay

## 2019-08-25 ENCOUNTER — Ambulatory Visit (HOSPITAL_COMMUNITY): Payer: PPO | Admitting: Clinical

## 2019-08-25 ENCOUNTER — Telehealth (HOSPITAL_COMMUNITY): Payer: Self-pay | Admitting: Clinical

## 2019-08-25 NOTE — Telephone Encounter (Signed)
The OPT therapist attempted text to session, then called the patient and got VM. The OPT therapist left a VM, however, the patient did not respond missing their scheduled CCA.

## 2019-08-29 DIAGNOSIS — Z89511 Acquired absence of right leg below knee: Secondary | ICD-10-CM | POA: Diagnosis not present

## 2019-08-29 DIAGNOSIS — Z794 Long term (current) use of insulin: Secondary | ICD-10-CM | POA: Diagnosis not present

## 2019-08-29 DIAGNOSIS — E1140 Type 2 diabetes mellitus with diabetic neuropathy, unspecified: Secondary | ICD-10-CM | POA: Diagnosis not present

## 2019-08-29 DIAGNOSIS — E1165 Type 2 diabetes mellitus with hyperglycemia: Secondary | ICD-10-CM | POA: Diagnosis not present

## 2019-08-29 DIAGNOSIS — E7800 Pure hypercholesterolemia, unspecified: Secondary | ICD-10-CM | POA: Diagnosis not present

## 2019-09-02 DIAGNOSIS — E113512 Type 2 diabetes mellitus with proliferative diabetic retinopathy with macular edema, left eye: Secondary | ICD-10-CM | POA: Diagnosis not present

## 2019-09-14 DIAGNOSIS — F411 Generalized anxiety disorder: Secondary | ICD-10-CM | POA: Diagnosis not present

## 2019-09-14 DIAGNOSIS — F3181 Bipolar II disorder: Secondary | ICD-10-CM | POA: Diagnosis not present

## 2019-09-16 DIAGNOSIS — E113511 Type 2 diabetes mellitus with proliferative diabetic retinopathy with macular edema, right eye: Secondary | ICD-10-CM | POA: Diagnosis not present

## 2019-09-26 ENCOUNTER — Emergency Department (HOSPITAL_COMMUNITY): Payer: PPO

## 2019-09-26 ENCOUNTER — Inpatient Hospital Stay (HOSPITAL_COMMUNITY): Payer: PPO

## 2019-09-26 ENCOUNTER — Inpatient Hospital Stay (HOSPITAL_COMMUNITY)
Admission: EM | Admit: 2019-09-26 | Discharge: 2019-10-02 | DRG: 871 | Disposition: A | Discharge status: Home Health | Payer: PPO | Attending: Internal Medicine | Admitting: Internal Medicine

## 2019-09-26 ENCOUNTER — Other Ambulatory Visit: Payer: Self-pay

## 2019-09-26 DIAGNOSIS — E785 Hyperlipidemia, unspecified: Secondary | ICD-10-CM | POA: Diagnosis present

## 2019-09-26 DIAGNOSIS — A4151 Sepsis due to Escherichia coli [E. coli]: Secondary | ICD-10-CM | POA: Diagnosis not present

## 2019-09-26 DIAGNOSIS — Z87892 Personal history of anaphylaxis: Secondary | ICD-10-CM | POA: Diagnosis not present

## 2019-09-26 DIAGNOSIS — Z7982 Long term (current) use of aspirin: Secondary | ICD-10-CM

## 2019-09-26 DIAGNOSIS — E1165 Type 2 diabetes mellitus with hyperglycemia: Secondary | ICD-10-CM | POA: Diagnosis not present

## 2019-09-26 DIAGNOSIS — Z6841 Body Mass Index (BMI) 40.0 and over, adult: Secondary | ICD-10-CM | POA: Diagnosis not present

## 2019-09-26 DIAGNOSIS — E662 Morbid (severe) obesity with alveolar hypoventilation: Secondary | ICD-10-CM | POA: Diagnosis not present

## 2019-09-26 DIAGNOSIS — Z885 Allergy status to narcotic agent status: Secondary | ICD-10-CM | POA: Diagnosis not present

## 2019-09-26 DIAGNOSIS — K760 Fatty (change of) liver, not elsewhere classified: Secondary | ICD-10-CM | POA: Diagnosis not present

## 2019-09-26 DIAGNOSIS — D509 Iron deficiency anemia, unspecified: Secondary | ICD-10-CM | POA: Diagnosis present

## 2019-09-26 DIAGNOSIS — Z794 Long term (current) use of insulin: Secondary | ICD-10-CM | POA: Diagnosis not present

## 2019-09-26 DIAGNOSIS — E1140 Type 2 diabetes mellitus with diabetic neuropathy, unspecified: Secondary | ICD-10-CM | POA: Diagnosis present

## 2019-09-26 DIAGNOSIS — R509 Fever, unspecified: Secondary | ICD-10-CM | POA: Diagnosis not present

## 2019-09-26 DIAGNOSIS — J9811 Atelectasis: Secondary | ICD-10-CM | POA: Diagnosis not present

## 2019-09-26 DIAGNOSIS — Z79899 Other long term (current) drug therapy: Secondary | ICD-10-CM

## 2019-09-26 DIAGNOSIS — K920 Hematemesis: Secondary | ICD-10-CM | POA: Diagnosis not present

## 2019-09-26 DIAGNOSIS — R1110 Vomiting, unspecified: Secondary | ICD-10-CM

## 2019-09-26 DIAGNOSIS — G928 Other toxic encephalopathy: Secondary | ICD-10-CM

## 2019-09-26 DIAGNOSIS — A419 Sepsis, unspecified organism: Secondary | ICD-10-CM | POA: Diagnosis not present

## 2019-09-26 DIAGNOSIS — E119 Type 2 diabetes mellitus without complications: Secondary | ICD-10-CM | POA: Diagnosis not present

## 2019-09-26 DIAGNOSIS — N179 Acute kidney failure, unspecified: Secondary | ICD-10-CM | POA: Diagnosis not present

## 2019-09-26 DIAGNOSIS — R739 Hyperglycemia, unspecified: Secondary | ICD-10-CM

## 2019-09-26 DIAGNOSIS — D631 Anemia in chronic kidney disease: Secondary | ICD-10-CM | POA: Diagnosis not present

## 2019-09-26 DIAGNOSIS — Z881 Allergy status to other antibiotic agents status: Secondary | ICD-10-CM

## 2019-09-26 DIAGNOSIS — G92 Toxic encephalopathy: Secondary | ICD-10-CM | POA: Diagnosis present

## 2019-09-26 DIAGNOSIS — Z20822 Contact with and (suspected) exposure to covid-19: Secondary | ICD-10-CM | POA: Diagnosis present

## 2019-09-26 DIAGNOSIS — F419 Anxiety disorder, unspecified: Secondary | ICD-10-CM | POA: Diagnosis present

## 2019-09-26 DIAGNOSIS — R1310 Dysphagia, unspecified: Secondary | ICD-10-CM

## 2019-09-26 DIAGNOSIS — F319 Bipolar disorder, unspecified: Secondary | ICD-10-CM | POA: Diagnosis not present

## 2019-09-26 DIAGNOSIS — Z89422 Acquired absence of other left toe(s): Secondary | ICD-10-CM

## 2019-09-26 DIAGNOSIS — Z91040 Latex allergy status: Secondary | ICD-10-CM

## 2019-09-26 DIAGNOSIS — J189 Pneumonia, unspecified organism: Secondary | ICD-10-CM | POA: Diagnosis not present

## 2019-09-26 DIAGNOSIS — Z8249 Family history of ischemic heart disease and other diseases of the circulatory system: Secondary | ICD-10-CM

## 2019-09-26 DIAGNOSIS — G9341 Metabolic encephalopathy: Secondary | ICD-10-CM

## 2019-09-26 DIAGNOSIS — I10 Essential (primary) hypertension: Secondary | ICD-10-CM | POA: Diagnosis not present

## 2019-09-26 DIAGNOSIS — I509 Heart failure, unspecified: Secondary | ICD-10-CM | POA: Diagnosis not present

## 2019-09-26 DIAGNOSIS — Z818 Family history of other mental and behavioral disorders: Secondary | ICD-10-CM

## 2019-09-26 DIAGNOSIS — E875 Hyperkalemia: Secondary | ICD-10-CM | POA: Diagnosis not present

## 2019-09-26 DIAGNOSIS — Z03818 Encounter for observation for suspected exposure to other biological agents ruled out: Secondary | ICD-10-CM | POA: Diagnosis not present

## 2019-09-26 DIAGNOSIS — J9601 Acute respiratory failure with hypoxia: Secondary | ICD-10-CM

## 2019-09-26 DIAGNOSIS — R6520 Severe sepsis without septic shock: Secondary | ICD-10-CM | POA: Diagnosis present

## 2019-09-26 DIAGNOSIS — Z833 Family history of diabetes mellitus: Secondary | ICD-10-CM

## 2019-09-26 DIAGNOSIS — D539 Nutritional anemia, unspecified: Secondary | ICD-10-CM | POA: Diagnosis present

## 2019-09-26 DIAGNOSIS — A403 Sepsis due to Streptococcus pneumoniae: Secondary | ICD-10-CM | POA: Diagnosis not present

## 2019-09-26 DIAGNOSIS — Z89431 Acquired absence of right foot: Secondary | ICD-10-CM

## 2019-09-26 DIAGNOSIS — Z09 Encounter for follow-up examination after completed treatment for conditions other than malignant neoplasm: Secondary | ICD-10-CM

## 2019-09-26 DIAGNOSIS — I4510 Unspecified right bundle-branch block: Secondary | ICD-10-CM | POA: Diagnosis not present

## 2019-09-26 DIAGNOSIS — Z89511 Acquired absence of right leg below knee: Secondary | ICD-10-CM

## 2019-09-26 DIAGNOSIS — J690 Pneumonitis due to inhalation of food and vomit: Secondary | ICD-10-CM | POA: Diagnosis not present

## 2019-09-26 DIAGNOSIS — R918 Other nonspecific abnormal finding of lung field: Secondary | ICD-10-CM | POA: Diagnosis not present

## 2019-09-26 DIAGNOSIS — R000 Tachycardia, unspecified: Secondary | ICD-10-CM | POA: Diagnosis not present

## 2019-09-26 DIAGNOSIS — K8020 Calculus of gallbladder without cholecystitis without obstruction: Secondary | ICD-10-CM | POA: Diagnosis not present

## 2019-09-26 DIAGNOSIS — R0902 Hypoxemia: Secondary | ICD-10-CM | POA: Diagnosis not present

## 2019-09-26 DIAGNOSIS — Z888 Allergy status to other drugs, medicaments and biological substances status: Secondary | ICD-10-CM

## 2019-09-26 LAB — COMPREHENSIVE METABOLIC PANEL
ALT: 33 U/L (ref 0–44)
AST: 32 U/L (ref 15–41)
Albumin: 3.4 g/dL — ABNORMAL LOW (ref 3.5–5.0)
Alkaline Phosphatase: 102 U/L (ref 38–126)
Anion gap: 15 (ref 5–15)
BUN: 42 mg/dL — ABNORMAL HIGH (ref 8–23)
CO2: 23 mmol/L (ref 22–32)
Calcium: 9 mg/dL (ref 8.9–10.3)
Chloride: 101 mmol/L (ref 98–111)
Creatinine, Ser: 1.97 mg/dL — ABNORMAL HIGH (ref 0.44–1.00)
GFR calc Af Amer: 31 mL/min — ABNORMAL LOW (ref >60)
GFR calc non Af Amer: 27 mL/min — ABNORMAL LOW (ref >60)
Glucose, Bld: 293 mg/dL — ABNORMAL HIGH (ref 70–99)
Potassium: 5.6 mmol/L — ABNORMAL HIGH (ref 3.5–5.1)
Sodium: 139 mmol/L (ref 135–145)
Total Bilirubin: 0.9 mg/dL (ref 0.3–1.2)
Total Protein: 7 g/dL (ref 6.5–8.1)

## 2019-09-26 LAB — CBC WITH DIFFERENTIAL/PLATELET
Abs Immature Granulocytes: 0.04 10*3/uL (ref 0.00–0.07)
Basophils Absolute: 0 10*3/uL (ref 0.0–0.1)
Basophils Relative: 1 %
Eosinophils Absolute: 0.1 10*3/uL (ref 0.0–0.5)
Eosinophils Relative: 2 %
HCT: 33.6 % — ABNORMAL LOW (ref 36.0–46.0)
Hemoglobin: 10.9 g/dL — ABNORMAL LOW (ref 12.0–15.0)
Immature Granulocytes: 1 %
Lymphocytes Relative: 8 %
Lymphs Abs: 0.5 10*3/uL — ABNORMAL LOW (ref 0.7–4.0)
MCH: 32.6 pg (ref 26.0–34.0)
MCHC: 32.4 g/dL (ref 30.0–36.0)
MCV: 100.6 fL — ABNORMAL HIGH (ref 80.0–100.0)
Monocytes Absolute: 0.1 10*3/uL (ref 0.1–1.0)
Monocytes Relative: 2 %
Neutro Abs: 5 10*3/uL (ref 1.7–7.7)
Neutrophils Relative %: 86 %
Platelets: 255 10*3/uL (ref 150–400)
RBC: 3.34 MIL/uL — ABNORMAL LOW (ref 3.87–5.11)
RDW: 15.2 % (ref 11.5–15.5)
WBC: 5.8 10*3/uL (ref 4.0–10.5)
nRBC: 0 % (ref 0.0–0.2)

## 2019-09-26 LAB — AMMONIA: Ammonia: 25 umol/L (ref 9–35)

## 2019-09-26 LAB — LIPASE, BLOOD: Lipase: 26 U/L (ref 11–51)

## 2019-09-26 LAB — PROTIME-INR
INR: 1.1 (ref 0.8–1.2)
Prothrombin Time: 13.9 seconds (ref 11.4–15.2)

## 2019-09-26 LAB — LACTIC ACID, PLASMA
Lactic Acid, Venous: 4 mmol/L (ref 0.5–1.9)
Lactic Acid, Venous: 2.6 mmol/L (ref 0.5–1.9)

## 2019-09-26 LAB — SARS CORONAVIRUS 2 BY RT PCR (HOSPITAL ORDER, PERFORMED IN ~~LOC~~ HOSPITAL LAB): SARS Coronavirus 2: NEGATIVE

## 2019-09-26 LAB — APTT: aPTT: 31 seconds (ref 24–36)

## 2019-09-26 MED ORDER — SODIUM CHLORIDE 0.9 % IV SOLN
3.0000 g | Freq: Four times a day (QID) | INTRAVENOUS | Status: DC
  Administered 2019-09-27 – 2019-09-30 (×13): 3 g via INTRAVENOUS
  Filled 2019-09-26 (×2): qty 3
  Filled 2019-09-26: qty 8
  Filled 2019-09-26: qty 3
  Filled 2019-09-26 (×3): qty 8
  Filled 2019-09-26: qty 3
  Filled 2019-09-26: qty 8
  Filled 2019-09-26: qty 3
  Filled 2019-09-26 (×3): qty 8
  Filled 2019-09-26 (×3): qty 3
  Filled 2019-09-26: qty 8

## 2019-09-26 MED ORDER — ACETAMINOPHEN 650 MG RE SUPP: 650.0000 mg | Freq: Four times a day (QID) | RECTAL | Status: DC | PRN

## 2019-09-26 MED ORDER — VANCOMYCIN HCL IN DEXTROSE 1-5 GM/200ML-% IV SOLN
1000.0000 mg | Freq: Once | INTRAVENOUS | Status: DC
  Filled 2019-09-26: qty 200

## 2019-09-26 MED ORDER — SODIUM CHLORIDE 0.9 % IV BOLUS (SEPSIS)
1000.0000 mL | Freq: Once | INTRAVENOUS | Status: AC
  Administered 2019-09-26: 1000 mL via INTRAVENOUS

## 2019-09-26 MED ORDER — PANTOPRAZOLE SODIUM 40 MG IV SOLR
40.0000 mg | Freq: Two times a day (BID) | INTRAVENOUS | Status: DC
  Administered 2019-09-26 – 2019-09-28 (×4): 40 mg via INTRAVENOUS
  Filled 2019-09-26 (×4): qty 40

## 2019-09-26 MED ORDER — ONDANSETRON HCL 4 MG/2ML IJ SOLN: 4.0000 mg | Freq: Four times a day (QID) | INTRAMUSCULAR | Status: DC | PRN

## 2019-09-26 MED ORDER — VANCOMYCIN HCL 1250 MG/250ML IV SOLN: 1250.0000 mg | INTRAVENOUS | Status: DC

## 2019-09-26 MED ORDER — INSULIN ASPART 100 UNIT/ML ~~LOC~~ SOLN
0.0000 [IU] | SUBCUTANEOUS | Status: DC
  Administered 2019-09-27: 3 [IU] via SUBCUTANEOUS
  Administered 2019-09-27 (×2): 11 [IU] via SUBCUTANEOUS
  Administered 2019-09-27: 2 [IU] via SUBCUTANEOUS
  Administered 2019-09-27 (×2): 11 [IU] via SUBCUTANEOUS
  Administered 2019-09-28: 5 [IU] via SUBCUTANEOUS
  Administered 2019-09-28: 2 [IU] via SUBCUTANEOUS
  Administered 2019-09-28: 8 [IU] via SUBCUTANEOUS
  Administered 2019-09-28: 2 [IU] via SUBCUTANEOUS
  Administered 2019-09-28: 8 [IU] via SUBCUTANEOUS
  Administered 2019-09-28: 5 [IU] via SUBCUTANEOUS
  Administered 2019-09-28: 8 [IU] via SUBCUTANEOUS
  Administered 2019-09-29 (×2): 5 [IU] via SUBCUTANEOUS
  Administered 2019-09-29 (×3): 8 [IU] via SUBCUTANEOUS
  Administered 2019-09-30: 3 [IU] via SUBCUTANEOUS
  Administered 2019-09-30: 5 [IU] via SUBCUTANEOUS
  Administered 2019-09-30: 8 [IU] via SUBCUTANEOUS
  Administered 2019-09-30 – 2019-10-01 (×5): 3 [IU] via SUBCUTANEOUS
  Administered 2019-10-01 (×2): 5 [IU] via SUBCUTANEOUS
  Administered 2019-10-01: 3 [IU] via SUBCUTANEOUS
  Administered 2019-10-02: 2 [IU] via SUBCUTANEOUS
  Administered 2019-10-02 (×2): 3 [IU] via SUBCUTANEOUS

## 2019-09-26 MED ORDER — SODIUM CHLORIDE 0.9 % IV BOLUS
1000.0000 mL | Freq: Once | INTRAVENOUS | Status: AC
  Administered 2019-09-26: 1000 mL via INTRAVENOUS

## 2019-09-26 MED ORDER — SODIUM CHLORIDE 0.9 % IV SOLN: INTRAVENOUS | Status: DC

## 2019-09-26 MED ORDER — ACETAMINOPHEN 650 MG RE SUPP
650.0000 mg | Freq: Once | RECTAL | Status: AC
  Administered 2019-09-26: 650 mg via RECTAL
  Filled 2019-09-26: qty 1

## 2019-09-26 MED ORDER — SODIUM CHLORIDE 0.9 % IV SOLN: 2.0000 g | Freq: Once | INTRAVENOUS | Status: DC

## 2019-09-26 MED ORDER — VANCOMYCIN HCL 2000 MG/400ML IV SOLN
2000.0000 mg | Freq: Once | INTRAVENOUS | Status: AC
  Administered 2019-09-26: 2000 mg via INTRAVENOUS
  Filled 2019-09-26: qty 400

## 2019-09-26 MED ORDER — SODIUM CHLORIDE 0.9 % IV BOLUS (SEPSIS)
500.0000 mL | Freq: Once | INTRAVENOUS | Status: AC
  Administered 2019-09-26: 500 mL via INTRAVENOUS

## 2019-09-26 MED ORDER — SODIUM CHLORIDE 0.9 % IV SOLN: 2.0000 g | Freq: Two times a day (BID) | INTRAVENOUS | Status: DC

## 2019-09-26 MED ORDER — INSULIN ASPART 100 UNIT/ML ~~LOC~~ SOLN: 5.0000 [IU] | Freq: Once | SUBCUTANEOUS | Status: DC

## 2019-09-26 MED ORDER — ACETAMINOPHEN 325 MG PO TABS: 650.0000 mg | ORAL_TABLET | Freq: Four times a day (QID) | ORAL | Status: DC | PRN

## 2019-09-26 MED ORDER — SODIUM CHLORIDE 0.9 % IV SOLN
2.0000 g | Freq: Once | INTRAVENOUS | Status: AC
  Administered 2019-09-26: 2 g via INTRAVENOUS
  Filled 2019-09-26: qty 2

## 2019-09-26 MED ORDER — METRONIDAZOLE IN NACL 5-0.79 MG/ML-% IV SOLN
500.0000 mg | Freq: Once | INTRAVENOUS | Status: AC
  Administered 2019-09-26: 500 mg via INTRAVENOUS
  Filled 2019-09-26: qty 100

## 2019-09-26 NOTE — ED Triage Notes (Signed)
Pt was called out by family for altered mental status and vomitting, patient has been vomitting all day, drowsy but otherwise AOx4 for [facility], 4 of zofran given en route, dark emesis on blouse. Tachycardic 130's, other VSS

## 2019-09-26 NOTE — Progress Notes (Signed)
Pharmacy Antibiotic Note  Dana [name] is a 64 y.o. female admitted on 09/26/2019 with sepsis.  Pharmacy has been consulted for Cefepime and Vancomycin dosing.   Height: 5\' 1"  (154.9 cm) Weight: 99 kg (218 lb 4.1 oz) IBW/kg (Calculated) : 47.8  Temp (24hrs), Avg:103.2 F (39.6 C), Min:103.2 F (39.6 C), Max:103.2 F (39.6 C)  Recent Labs  Lab 09/26/19 1906  WBC 5.8  LATICACIDVEN 4.0*    CrCl cannot be calculated (Patient's most recent lab result is older than the maximum 21 days allowed.).    Allergies  Allergen Reactions  . Keflex [Cephalexin]     Tremors, rash, hard to breathe  . Lithium Nausea And Vomiting and Other (See Comments)    Other reaction(s): Other (See Comments) Can not keep this medication down. It makes her terribly ill. Can not keep this medication down. It makes her terribly ill.  . Ativan [Lorazepam] Other (See Comments)    Other reaction(s): ANAPHYLAXIS Other reaction(s): Other (See Comments) Abnormal behavior Abnormal behavior  . Demerol [Meperidine] Other (See Comments)    Abnormal behavior (sees things) Other reaction(s): ANAPHYLAXIS Other reaction(s): Other (See Comments) Abnormal behavior Abnormal behavior  . Oxycodone Other (See Comments)    Other reaction(s): OTHER Abnormal behavior  . Codeine Itching and Other (See Comments)    hallucinations  . Doxycycline Other (See Comments)    Severe muscle tremor  . Darvocet [Propoxyphene N-Acetaminophen] Itching  . Latex Itching    Other reaction(s): OTHER  . Propoxyphene Itching    Antimicrobials this admission: 5/17 Cefepime >>  5/17 Vancomycin >>   Dose adjustments this admission: N/a  Microbiology results: Pending   Plan:  - Cefepime 2g IV q12h - Vancomycin 2000mg  IV x 1 dose  - Followed by Vancomycin 1250mg  IV q48h - Est Calc AUC 550  - Monitor patients renal function and urine output  - De-escalate ABX when appropriate   Thank you for allowing pharmacy to be a part of  this patient's care.  Duanne Limerick PharmD. BCPS 09/26/2019 8:13 PM

## 2019-09-26 NOTE — ED Notes (Signed)
Pt transported to CT ?

## 2019-09-26 NOTE — ED Provider Notes (Signed)
[facility] EMERGENCY DEPARTMENT Provider Note   CSN: [id number] Arrival date & time: 09/26/19  1858     History Chief Complaint  Patient presents with  . Altered Mental Status  . Vomiting  . Nausea    Dana [name] is a 64 y.o. female who  has a past medical history of Bipolar 1 disorder ([place]), Cellulitis and abscess of foot (03/08/2015), Diabetes mellitus, Diabetic neuropathy ([place]), Erythropoietin deficiency anemia (09/20/2015), H/O hiatal hernia, Hyperlipidemia, Hypertension, Iron malabsorption (09/26/2015), Numbness and tingling, and Other iron deficiency anemias (09/20/2015). She presenst with AMS and vomiting. EMS gives the report at bedside. Patient is unable to provide hx. EMS reports that they were called out to the patient's house by family for progressively worsening altered mental status and vomiting all day long.  Patient was found very lethargic.  She has had multiple episodes of dark emesis.  EMS reports that she was tachycardic to the 130s had a blood sugar of 349 in the field and a temperature of 103.  Patient able to answer some questions.  She denies any abdominal pain.  HPI     Past Medical History:  Diagnosis Date  . Bipolar 1 disorder ([place])   . Cellulitis and abscess of foot 03/08/2015  . Diabetes mellitus    INSULIN DEPENDENT  . Diabetic neuropathy ([place])   . 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 H/O hiatal hernia   . Hyperlipidemia   . Hypertension    past hx of  . Iron malabsorption 09/26/2015  . Numbness and tingling    Hx; of in B/LLE and B/LUE  . Other iron deficiency anemias 09/20/2015    Patient Active Problem List   Diagnosis Date Noted  . Severe sepsis ([place]) 09/27/2019  . Acute respiratory failure with hypoxia ([place]) 09/27/2019  . Emesis 09/27/2019  . Hyperglycemia 09/27/2019  . Sepsis ([place]) 09/27/2019  . Pneumonia 09/26/2019  . Partial nontraumatic amputation of left foot ([place]) 04/28/2017  . Acquired absence of  right leg below knee ([place]) 04/14/2017  . Pleural effusion 01/19/2017  . Contracture of left Achilles tendon 01/16/2017  . Allergic rhinitis 01/03/2017  . Gastroesophageal reflux disease without esophagitis 01/03/2017  . Hiatal hernia 02/03/2016  . Anemia associated with diabetes mellitus ([place]) 11/07/2015  . Iron malabsorption 09/26/2015  . Other iron deficiency anemia 09/20/2015  . 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 Diabetic retinopathy associated with diabetes mellitus due to underlying condition ([place]) 03/15/2015  . DM (diabetes mellitus) type II uncontrolled with eye manifestation ([place]) 11/08/2014  . Hyperlipidemia LDL goal <100 11/08/2014  . Diabetic polyneuropathy associated with type 2 diabetes mellitus ([place]) 11/08/2014  . Partial nontraumatic amputation of foot ([place]) 11/08/2014  . Type 2 diabetes mellitus with diabetic polyneuropathy, with long-term current use of insulin ([place]) 10/24/2011  . Bipolar 1 disorder ([place]) 10/24/2011    Past Surgical History:  Procedure Laterality Date  . ABDOMINAL HYSTERECTOMY    . ADENOIDECTOMY     Hx: of  . AMPUTATION  10/24/2011   Procedure: AMPUTATION RAY;  Surgeon: Newt Minion, MD;  Location: [facility];  Service: Orthopedics;  Laterality: Right;  Right Foot 3rd Ray Amputation  . AMPUTATION Right 12/16/2012   Procedure: Right Foot Transmetatarsal Amputation;  Surgeon: Newt Minion, MD;  Location: [facility];  Service: Orthopedics;  Laterality: Right;  . AMPUTATION Left 03/09/2015   Procedure: LEFT FOOT 1ST RAY AMPUTATION;  Surgeon: Newt Minion, MD;  Location: [facility];  Service: Orthopedics;  Laterality: Left;  .  BLADDER SURGERY     x 2, tacked 1st time; mesh "eroded", had to be removed  . Bladder Tact   2002  . BREAST SURGERY Left    "knot removed"  . CARPAL TUNNEL RELEASE Left   . COLON SURGERY    . EYE SURGERY  11/17/2017  . NASAL SEPTUM SURGERY  1976  . TONSILLECTOMY     age 27's     OB History   No obstetric history on file.      Family History  Problem Relation Age of Onset  . Diabetes Mother   . Mental illness Mother   . Bipolar disorder Mother   . Hypertension Father   . Osteoarthritis Father   . Heart disease Father   . Diabetes Sister   . Early death Brother        MVA  . Healthy Sister   . Diabetes Maternal Grandmother     Social History   Tobacco Use  . Smoking status: Never Smoker  . Smokeless tobacco: Never Used  Substance Use Topics  . Alcohol use: No    Alcohol/week: 0.0 standard drinks  . Drug use: No    Home Medications Prior to Admission medications   Medication Sig Start Date End Date Taking? Authorizing Provider  APOAEQUORIN PO Take 1 tablet by mouth daily.   Yes [provider]  aspirin 81 MG EC tablet Take 81 mg by mouth daily. Swallow whole.   Yes [provider]  atorvastatin (LIPITOR) 20 MG tablet Take 1 tablet (20 mg total) by mouth at bedtime. 07/20/18  Yes Scot Jun, FNP  busPIRone (BUSPAR) 10 MG tablet Take two tablets three times daily. Patient taking differently: Take 10 mg by mouth 3 (three) times daily. Take two tablets three times daily. 08/03/19  Yes Mozingo, Berdie Ogren, NP  Ferrous Sulfate (IRON) 325 (65 Fe) MG TABS Take 325 mg by mouth daily.    Yes [provider]  furosemide (LASIX) 20 MG tablet TAKE 1 TABLET BY MOUTH TWICE DAILY Patient taking differently: Take 40 mg by mouth 2 (two) times daily.  02/24/18  Yes Brunetta Jeans, PA-C  gabapentin (NEURONTIN) 600 MG tablet Take 2 tablets (1,200 mg total) by mouth 3 (three) times daily. Patient taking differently: Take 1,200 mg by mouth See admin instructions. Take 1 tablet (600 mg totally) by mouth in the morning; then 4 tablets (2,400 mg totally) by mouth in the evening at 8 PM 07/20/18  Yes Scot Jun, FNP  HUMULIN R U-500 KWIKPEN 500 UNIT/ML kwikpen Inject 45 Units into the skin 3 (three) times daily with meals.  07/31/19  Yes [provider]  JANUVIA 50  MG tablet Take 50 mg by mouth daily. 07/07/19  Yes [provider]  lamoTRIgine (LAMICTAL) 100 MG tablet Take 100 mg by mouth daily. 09/15/19  Yes [provider]  metFORMIN (GLUCOPHAGE-XR) 500 MG 24 hr tablet TAKE 2 TABLETS IN THE MORNING AND 2 TABLETS AT NIGHT. Patient taking differently: Take 1,000 mg by mouth 2 (two) times daily.  12/09/17  Yes Elayne Snare, MD  Multiple Vitamins-Minerals (CENTRUM ADULTS PO) Take 1 tablet by mouth daily.   Yes [provider]  NOVOLIN N RELION 100 UNIT/ML injection INJECT 60 UNITS AM and 50 units PM SUBCUTANEOUSLY TWICE DAILY FOR 30 DAYS Patient taking differently: Inject 75 Units into the skin 3 (three) times daily.  07/20/18  Yes Scot Jun, FNP  pantoprazole (PROTONIX) 40 MG tablet Take 1 tablet (  40 mg total) by mouth daily. 07/20/18  Yes Scot Jun, FNP  QUEtiapine (SEROQUEL) 100 MG tablet Take two tablets at bedtime. Patient taking differently: Take 200 mg by mouth at bedtime. Take two tablets at bedtime. 08/03/19  Yes Mozingo, Berdie Ogren, NP  venlafaxine XR (EFFEXOR-XR) 37.5 MG 24 hr capsule Take 37.5 mg by mouth daily. 08/16/19  Yes [provider]  BD VEO INSULIN SYRINGE U/F 31G X 15/64" 1 ML MISC Administer insulin up 3 times per day per sliding scale E.11.9 07/20/18   Scot Jun, FNP  Continuous Blood Gluc Sensor (FREESTYLE LIBRE 14 DAY SENSOR) MISC SMARTSIG:1 Each Topical Every 2 Weeks 07/17/19   [provider]  divalproex (DEPAKOTE) 125 MG DR tablet Take 1 tablet (125 mg total) by mouth daily. Patient not taking: Reported on 09/26/2019 08/03/19   Mozingo, Berdie Ogren, NP  fluticasone [facility]) 50 MCG/ACT nasal spray USE 2 SPRAY(S) IN [facility] NOSTRIL ONCE DAILY Patient not taking: Reported on 06/30/2019 07/26/18   Scot Jun, FNP  glucose blood (FREESTYLE LITE) test strip USE AS INSTRUCTED TO CHECK BLOOD SUGAR 3 TIMES DAILY. DX:E11.65 07/20/18   Scot Jun, FNP  insulin regular  (NOVOLIN R,HUMULIN R) 100 units/mL injection Administer per sliding scale provided Patient not taking: Reported on 09/26/2019 07/20/18   Scot Jun, FNP  Lancets (FREESTYLE) lancets Use as instructed to check blood sugar 3 times per day dx code E11.39 07/20/18   Scot Jun, FNP  Ostomy Supplies (SKIN TAC ADHESIVE BARRIER WIPE) MISC 1 each by Does not apply route daily. 11/25/17   Elayne Snare, MD    Allergies    Keflex [cephalexin], Lithium, Ativan [lorazepam], Demerol [meperidine], Oxycodone, Codeine, Doxycycline, Darvocet [propoxyphene n-acetaminophen], Latex, and Propoxyphene  Review of Systems   Review of Systems Able to review systems secondary to altered mental status Physical Exam Updated Vital Signs BP 114/62 (BP Location: Left Arm)   Pulse 100   Temp 98.8 F (37.1 C) (Oral)   Resp 15   Ht 5\' 1"  (1.549 m)   Wt 100.4 kg   SpO2 100%   BMI 41.82 kg/m   Physical Exam Vitals and nursing note reviewed.  Constitutional:      General: She is not in acute distress.    Appearance: She is well-developed. She is ill-appearing. She is not diaphoretic.     Comments: Patient is lethargic and confused [place] is covered in dark vomitus  HENT:     Head: Normocephalic and atraumatic.  Eyes:     General: No scleral icterus.    Conjunctiva/sclera: Conjunctivae normal.  Cardiovascular:     Rate and Rhythm: Normal rate and regular rhythm.     Heart sounds: Normal heart sounds. No murmur. No friction rub. No gallop.   Pulmonary:     Effort: Pulmonary effort is normal. No respiratory distress.     Breath sounds: Normal breath sounds.  Abdominal:     General: Bowel sounds are normal. There is distension.     Palpations: Abdomen is soft. There is no mass.     Tenderness: There is no abdominal tenderness. There is no guarding.  Musculoskeletal:     Cervical back: Normal range of motion.  Skin:    General: Skin is warm and dry.     ED Results / Procedures / Treatments    Labs (all labs ordered are listed, but only abnormal results are displayed) Labs Reviewed  LACTIC ACID, PLASMA - Abnormal; Notable for the following  components:      Result Value   Lactic Acid, Venous 4.0 (*)    All other components within normal limits  LACTIC ACID, PLASMA - Abnormal; Notable for the following components:   Lactic Acid, Venous 2.6 (*)    All other components within normal limits  COMPREHENSIVE METABOLIC PANEL - Abnormal; Notable for the following components:   Potassium 5.6 (*)    Glucose, Bld 293 (*)    BUN 42 (*)    Creatinine, Ser 1.97 (*)    Albumin 3.4 (*)    GFR calc non Af Amer 27 (*)    GFR calc Af Amer 31 (*)    All other components within normal limits  CBC WITH DIFFERENTIAL/PLATELET - Abnormal; Notable for the following components:   RBC 3.34 (*)    Hemoglobin 10.9 (*)    HCT 33.6 (*)    MCV 100.6 (*)    Lymphs Abs 0.5 (*)    All other components within normal limits  LACTIC ACID, PLASMA - Abnormal; Notable for the following components:   Lactic Acid, Venous 2.3 (*)    All other components within normal limits  IRON AND TIBC - Abnormal; Notable for the following components:   TIBC 199 (*)    All other components within normal limits  FERRITIN - Abnormal; Notable for the following components:   Ferritin 482 (*)    All other components within normal limits  BASIC METABOLIC PANEL - Abnormal; Notable for the following components:   Potassium 5.2 (*)    CO2 18 (*)    Glucose, Bld 299 (*)    BUN 42 (*)    Creatinine, Ser 2.04 (*)    Calcium 7.9 (*)    GFR calc non Af Amer 25 (*)    GFR calc Af Amer 30 (*)    All other components within normal limits  URINALYSIS, ROUTINE W REFLEX MICROSCOPIC - Abnormal; Notable for the following components:   Protein, ur 30 (*)    Leukocytes,Ua TRACE (*)    Bacteria, UA RARE (*)    All other components within normal limits  GLUCOSE, CAPILLARY - Abnormal; Notable for the following components:    Glucose-Capillary 200 (*)    All other components within normal limits  CBG MONITORING, ED - Abnormal; Notable for the following components:   Glucose-Capillary 312 (*)    All other components within normal limits  CBG MONITORING, ED - Abnormal; Notable for the following components:   Glucose-Capillary 328 (*)    All other components within normal limits  CBG MONITORING, ED - Abnormal; Notable for the following components:   Glucose-Capillary 228 (*)    All other components within normal limits  CULTURE, BLOOD (ROUTINE X 2)  CULTURE, BLOOD (ROUTINE X 2)  SARS CORONAVIRUS 2 BY RT PCR (HOSPITAL ORDER, [facility])  URINE CULTURE  APTT  PROTIME-INR  LIPASE, BLOOD  AMMONIA  PROCALCITONIN  VITAMIN B12  HIV ANTIBODY (ROUTINE TESTING W REFLEX)  FOLATE  SODIUM, URINE, RANDOM  CREATININE, URINE, RANDOM  URINALYSIS, ROUTINE W REFLEX MICROSCOPIC  HEMOGLOBIN A1C  OCCULT BLOOD GASTRIC / DUODENUM (SPECIMEN CUP)  I-STAT VENOUS BLOOD GAS, ED    EKG EKG Interpretation  Date/Time:  Monday Sep 26 2019 19:19:48 EDT Ventricular Rate:  127 PR Interval:    QRS Duration: 133 QT Interval:  316 QTC Calculation: 460 R Axis:   -56 Text Interpretation: Sinus or ectopic atrial tachycardia RBBB and LAFB Probable left ventricular hypertrophy  Since last tracing rate faster Confirmed by Dorie Rank [phone]) on 09/26/2019 8:38:47 PM   Radiology CT ABDOMEN PELVIS WO CONTRAST  Result Date: 09/26/2019 CLINICAL DATA:  Nausea, vomiting EXAM: CT ABDOMEN AND PELVIS WITHOUT CONTRAST TECHNIQUE: Multidetector CT imaging of the abdomen and pelvis was performed following the standard protocol without IV contrast. COMPARISON:  07/03/2016 FINDINGS: Lower chest: Calcified granuloma at the left lung base. Bibasilar scarring. No acute abnormality. Hepatobiliary: Small layering gallstones within the gallbladder, stable. Diffuse fatty infiltration throughout the liver. No focal hepatic abnormality.  Pancreas: No focal abnormality or ductal dilatation. Spleen: No focal abnormality.  Normal size. Adrenals/Urinary Tract: Bilateral perinephric stranding. No renal or ureteral stones. No hydronephrosis adrenal glands and urinary bladder unremarkable. Stomach/Bowel: Normal appendix. Stomach, large and small bowel grossly unremarkable. Vascular/Lymphatic: Aortic atherosclerosis. No evidence of aneurysm or adenopathy. Reproductive: Prior hysterectomy.  No adnexal masses. Other: No free fluid or free air. Musculoskeletal: No acute bony abnormality. IMPRESSION: Cholelithiasis. Diffuse fatty infiltration of the liver. Aortic atherosclerosis. No acute findings in the abdomen or pelvis. Electronically Signed   By: Rolm Baptise M.D.   On: 09/26/2019 23:26   DG Chest Port 1 View  Result Date: 09/26/2019 CLINICAL DATA:  Sepsis EXAM: PORTABLE CHEST 1 VIEW COMPARISON:  06/27/2018 FINDINGS: The heart size and mediastinal contours are within normal limits. Heterogeneous airspace opacity of the left lung base. The visualized skeletal structures are unremarkable. IMPRESSION: Heterogeneous airspace opacity of the left lung base, concerning for infection or aspiration. Electronically Signed   By: Eddie Candle M.D.   On: 09/26/2019 20:23    Procedures .Critical Care Performed by: Margarita Mail, PA-C Authorized by: Margarita Mail, PA-C   Critical care provider statement:    Critical care time (minutes):  60   Critical care time was exclusive of:  Separately billable procedures and treating other patients   Critical care was necessary to treat or prevent imminent or life-threatening deterioration of the following conditions:  Sepsis   Critical care was time spent personally by me on the following activities:  Discussions with consultants, evaluation of patient's response to treatment, examination of patient, ordering and performing treatments and interventions, ordering and review of laboratory studies, ordering and  review of radiographic studies, pulse oximetry, re-evaluation of patient's condition, obtaining history from patient or surrogate and review of old charts   (including critical care time)  Medications Ordered in ED Medications  acetaminophen (TYLENOL) tablet 650 mg (has no administration in time range)    Or  acetaminophen (TYLENOL) suppository 650 mg (has no administration in time range)  insulin aspart (novoLOG) injection 0-15 Units (3 Units Subcutaneous Given 09/27/19 1209)  pantoprazole (PROTONIX) injection 40 mg (40 mg Intravenous Given 09/27/19 1246)  insulin aspart (novoLOG) injection 5 Units (5 Units Subcutaneous Not Given 09/26/19 2347)  Ampicillin-Sulbactam (UNASYN) 3 g in sodium chloride 0.9 % 100 mL IVPB (3 g Intravenous New Bag/Given 09/27/19 1321)  ondansetron (ZOFRAN) injection 4 mg (has no administration in time range)  0.9 %  sodium chloride infusion ( Intravenous Rate/Dose Change 09/27/19 1309)  insulin detemir (LEVEMIR) injection 20 Units (20 Units Subcutaneous Given 09/27/19 1428)  sodium chloride 0.9 % bolus 1,000 mL (0 mLs Intravenous Stopped 09/26/19 2130)    And  sodium chloride 0.9 % bolus 500 mL (0 mLs Intravenous Stopped 09/26/19 2031)  metroNIDAZOLE (FLAGYL) IVPB 500 mg (0 mg Intravenous Stopped 09/26/19 2143)  acetaminophen (TYLENOL) suppository 650 mg (650 mg Rectal Given 09/26/19 2004)  ceFEPIme (MAXIPIME) 2 g in  sodium chloride 0.9 % 100 mL IVPB (0 g Intravenous Stopped 09/26/19 2031)  vancomycin (VANCOREADY) IVPB 2000 mg/400 mL (0 mg Intravenous Stopped 09/27/19 0013)  sodium chloride 0.9 % bolus 1,000 mL (0 mLs Intravenous Stopped 09/27/19 0049)    ED Course  I have reviewed the triage vital signs and the nursing notes.  Pertinent labs & imaging results that were available during my care of the patient were reviewed by me and considered in my medical decision making (see chart for details).  Clinical Course as of Sep 26 1641  Mon Sep 26, 2019  2019 Lactic Acid,  Venous(!!): 4.0 [AH]  2019 Potassium(!): 5.6 [AH]  2037 Creatinine(!): 1.97 [AH]    Clinical Course User Index [AH] Margarita Mail, PA-C   MDM Rules/Calculators/A&P                      CC:ams VS: BP 114/62 (BP Location: Left Arm)   Pulse 100   Temp 98.8 F (37.1 C) (Oral)   Resp 15   Ht 5\' 1"  (1.549 m)   Wt 100.4 kg   SpO2 100%   BMI 41.82 kg/m   PJ:SRPRXYV is gathered by ems and emr. Previous records obtained and reviewed. DDX:The patient's complaint of ams involves an extensive number of diagnostic and treatment options, and is a complaint that carries with it a high risk of complications, morbidity, and potential mortality. Given the large differential diagnosis, medical decision making is of high complexity. The differential diagnosis for AMS is extensive and includes, but is not limited to: drug overdose - opioids, alcohol, sedatives, antipsychotics, drug withdrawal, others; Metabolic: hypoxia, hypoglycemia, hyperglycemia, hypercalcemia, hypernatremia, hyponatremia, uremia, hepatic encephalopathy, hypothyroidism, hyperthyroidism, vitamin B12 or thiamine deficiency, carbon monoxide poisoning, Wilson's disease, Lactic acidosis, DKA/HHOS; Infectious: meningitis, encephalitis, bacteremia/sepsis, urinary tract infection, pneumonia, neurosyphilis; Structural: Space-occupying lesion, (brain tumor, subdural hematoma, hydrocephalus,); Vascular: stroke, subarachnoid hemorrhage, coronary ischemia, hypertensive encephalopathy, CNS vasculitis, thrombotic thrombocytopenic purpura, disseminated intravascular coagulation, hyperviscosity; Psychiatric: Schizophrenia, depression; Other: Seizure, hypothermia, heat stroke, ICU psychosis, dementia -"sundowning."  Labs: I ordered reviewed and interpreted labs which include UA negative for infection, CBC with stable anemia and no Leukocytosis, CMP with AKI, elevated glucose, elveated potassium is hemolyized on sample. Lactic acid is 4.00  Imaging: I  ordered and reviewed images which included 1 v cxr. I independently visualized and interpreted all imaging. Significant findings include Pneumonia on the cxr.  \EKG sinus tachycardia with block - rate of 127 Consults: Medicine service OPF:[id number] critically ill with sepsis due to Pneumonia. Her abdominal exam is benign despite vomiting. Sepsis protocol initiated with broad spectrum abx and fluids per IBW. Patient will need admission Patient disposition: admit The patient appears reasonably stabilized for admission considering the current resources, flow, and capabilities available in the ED at this time, and I doubt any other [facility] requiring further screening and/or treatment in the ED prior to admission.        Final Clinical Impression(s) / ED Diagnoses Final diagnoses:  Sepsis due to pneumonia [facility])  Toxic metabolic encephalopathy    Rx / DC Orders ED Discharge Orders    None       Margarita Mail, PA-C 09/27/19 1643    Dorie Rank, MD 09/28/19 [phone]

## 2019-09-26 NOTE — H&P (Signed)
History and Physical    Dana [name] DGL:[id number] DOB: [birth year] DOA: 09/26/2019  PCP: [name], DO Patient coming from: Home  Chief Complaint: Altered mental status, vomiting  HPI: Dana [name] is a 64 y.o. female with medical history significant of bipolar 1 disorder, type 2 diabetes, diabetic neuropathy, anemia, iron malabsorption, hypertension, hyperlipidemia presenting to the ED via EMS for evaluation of altered mental status and vomiting.  EMS reported that they were called out to the patient's house by family for progressively worsening altered mental status and vomiting all day long.  She is found to be very lethargic.  She has had multiple episodes of dark emesis.  EMS reported that she was tachycardic to the 130s, blood glucose 349, and temperature 103 en route.  Patient states she has been vomiting all day and feeling hot.  Her vomit was dark in color.  Denies abdominal pain.  States her last bowel movement was a few days ago.  States she had a temperature above 100 F this morning.  Denies cough, shortness of breath, or chest pain.  States she is no longer nauseous since she has been in the ED. Reports aspirating food the other day when she was trying to eat.  She has no other complaints.  ED Course: Febrile with T-max 103.2 F.  Tachycardic with heart rate in the 120s.  Slightly tachypneic.  Hypotensive.  Labs showing no leukocytosis.  Lactic acid 4.0 >2.6 with IVF.  Hemoglobin 10.9 and MCV 100.6.  Hemoglobin was ranging between 11.2-11.5 on labs done in February 2021.  Potassium 5.6.  Blood glucose 293.  Bicarb 23, anion gap 15.  BUN 42, creatinine 1.9.  Baseline creatinine 1.1-1.2.  Lipase and LFTs normal.  INR 1.1.  Ammonia level normal.  SARS-CoV-2 PCR test negative.  UA and urine culture pending.  Blood culture x2 pending.  Chest x-ray personally reviewed showing a heterogenous airspace opacity at the left lung base concerning for infection or aspiration.  Patient was  given Tylenol, vancomycin, cefepime, metronidazole, and 1.5 L normal saline boluses.  Review of Systems:  All systems reviewed and apart from history of presenting illness, are negative.  Past Medical History:  Diagnosis Date  . Bipolar 1 disorder ([place])   . Cellulitis and abscess of foot 03/08/2015  . Diabetes mellitus    INSULIN DEPENDENT  . Diabetic neuropathy ([place])   . 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 H/O hiatal hernia   . Hyperlipidemia   . Hypertension    past hx of  . Iron malabsorption 09/26/2015  . Numbness and tingling    Hx; of in B/LLE and B/LUE  . Other iron deficiency anemias 09/20/2015    Past Surgical History:  Procedure Laterality Date  . ABDOMINAL HYSTERECTOMY    . ADENOIDECTOMY     Hx: of  . AMPUTATION  10/24/2011   Procedure: AMPUTATION RAY;  Surgeon: Newt Minion, MD;  Location: [facility];  Service: Orthopedics;  Laterality: Right;  Right Foot 3rd Ray Amputation  . AMPUTATION Right 12/16/2012   Procedure: Right Foot Transmetatarsal Amputation;  Surgeon: Newt Minion, MD;  Location: [facility];  Service: Orthopedics;  Laterality: Right;  . AMPUTATION Left 03/09/2015   Procedure: LEFT FOOT 1ST RAY AMPUTATION;  Surgeon: Newt Minion, MD;  Location: [facility];  Service: Orthopedics;  Laterality: Left;  . BLADDER SURGERY     x 2, tacked 1st time; mesh "eroded", had to be removed  . Bladder Tact   2002  .  BREAST SURGERY Left    "knot removed"  . CARPAL TUNNEL RELEASE Left   . COLON SURGERY    . EYE SURGERY  11/17/2017  . NASAL SEPTUM SURGERY  1976  . TONSILLECTOMY     age 88's     reports that she has never smoked. She has never used smokeless tobacco. She reports that she does not drink alcohol or use drugs.  Allergies  Allergen Reactions  . Keflex [Cephalexin]     Tremors, rash, hard to breathe  . Lithium Nausea And Vomiting and Other (See Comments)    Other reaction(s): Other (See Comments) Can not keep this medication down. It makes her terribly  ill. Can not keep this medication down. It makes her terribly ill.  . Ativan [Lorazepam] Other (See Comments)    Other reaction(s): ANAPHYLAXIS Other reaction(s): Other (See Comments) Abnormal behavior   . Demerol [Meperidine] Other (See Comments)    Abnormal behavior (sees things) Other reaction(s): ANAPHYLAXIS Other reaction(s): Other (See Comments)   . Oxycodone Other (See Comments)    Other reaction(s): OTHER Abnormal behavior  . Codeine Itching and Other (See Comments)    hallucinations  . Doxycycline Other (See Comments)    Severe muscle tremor  . Darvocet [Propoxyphene N-Acetaminophen] Itching  . Latex Itching    Other reaction(s): OTHER  . Propoxyphene Itching    Family History  Problem Relation Age of Onset  . Diabetes Mother   . Mental illness Mother   . Bipolar disorder Mother   . Hypertension Father   . Osteoarthritis Father   . Heart disease Father   . Diabetes Sister   . Early death Brother        MVA  . Healthy Sister   . Diabetes Maternal Grandmother     Prior to Admission medications   Medication Sig Start Date End Date Taking? Authorizing Provider  APOAEQUORIN PO Take 1 tablet by mouth daily.   Yes [provider]  aspirin 81 MG EC tablet Take 81 mg by mouth daily. Swallow whole.   Yes [provider]  atorvastatin (LIPITOR) 20 MG tablet Take 1 tablet (20 mg total) by mouth at bedtime. 07/20/18  Yes Scot Jun, FNP  busPIRone (BUSPAR) 10 MG tablet Take two tablets three times daily. Patient taking differently: Take 10 mg by mouth 3 (three) times daily. Take two tablets three times daily. 08/03/19  Yes Mozingo, Berdie Ogren, NP  Ferrous Sulfate (IRON) 325 (65 Fe) MG TABS Take 325 mg by mouth daily.    Yes [provider]  furosemide (LASIX) 20 MG tablet TAKE 1 TABLET BY MOUTH TWICE DAILY Patient taking differently: Take 40 mg by mouth 2 (two) times daily.  02/24/18  Yes Brunetta Jeans, PA-C  gabapentin  (NEURONTIN) 600 MG tablet Take 2 tablets (1,200 mg total) by mouth 3 (three) times daily. Patient taking differently: Take 1,200 mg by mouth See admin instructions. Take 1 tablet (600 mg totally) by mouth in the morning; then 4 tablets (2,400 mg totally) by mouth in the evening at 8 PM 07/20/18  Yes Scot Jun, FNP  HUMULIN R U-500 KWIKPEN 500 UNIT/ML kwikpen Inject 45 Units into the skin 3 (three) times daily with meals.  07/31/19  Yes [provider]  JANUVIA 50 MG tablet Take 50 mg by mouth daily. 07/07/19  Yes [provider]  lamoTRIgine (LAMICTAL) 100 MG tablet Take 100 mg by mouth daily. 09/15/19  Yes [provider]  metFORMIN (GLUCOPHAGE-XR) 500  MG 24 hr tablet TAKE 2 TABLETS IN THE MORNING AND 2 TABLETS AT NIGHT. Patient taking differently: Take 1,000 mg by mouth 2 (two) times daily.  12/09/17  Yes Elayne Snare, MD  Multiple Vitamins-Minerals (CENTRUM ADULTS PO) Take 1 tablet by mouth daily.   Yes [provider]  NOVOLIN N RELION 100 UNIT/ML injection INJECT 60 UNITS AM and 50 units PM SUBCUTANEOUSLY TWICE DAILY FOR 30 DAYS Patient taking differently: Inject 75 Units into the skin 3 (three) times daily.  07/20/18  Yes Scot Jun, FNP  pantoprazole (PROTONIX) 40 MG tablet Take 1 tablet (40 mg total) by mouth daily. 07/20/18  Yes Scot Jun, FNP  QUEtiapine (SEROQUEL) 100 MG tablet Take two tablets at bedtime. Patient taking differently: Take 200 mg by mouth at bedtime. Take two tablets at bedtime. 08/03/19  Yes Mozingo, Berdie Ogren, NP  venlafaxine XR (EFFEXOR-XR) 37.5 MG 24 hr capsule Take 37.5 mg by mouth daily. 08/16/19  Yes [provider]  BD VEO INSULIN SYRINGE U/F 31G X 15/64" 1 ML MISC Administer insulin up 3 times per day per sliding scale E.11.9 07/20/18   Scot Jun, FNP  Continuous Blood Gluc Sensor (FREESTYLE LIBRE 14 DAY SENSOR) MISC SMARTSIG:1 Each Topical Every 2 Weeks 07/17/19   [provider]   divalproex (DEPAKOTE) 125 MG DR tablet Take 1 tablet (125 mg total) by mouth daily. Patient not taking: Reported on 09/26/2019 08/03/19   Mozingo, Berdie Ogren, NP  fluticasone [facility]) 50 MCG/ACT nasal spray USE 2 SPRAY(S) IN [facility] NOSTRIL ONCE DAILY Patient not taking: Reported on 06/30/2019 07/26/18   Scot Jun, FNP  glucose blood (FREESTYLE LITE) test strip USE AS INSTRUCTED TO CHECK BLOOD SUGAR 3 TIMES DAILY. DX:E11.65 07/20/18   Scot Jun, FNP  insulin regular (NOVOLIN R,HUMULIN R) 100 units/mL injection Administer per sliding scale provided Patient not taking: Reported on 09/26/2019 07/20/18   Scot Jun, FNP  Lancets (FREESTYLE) lancets Use as instructed to check blood sugar 3 times per day dx code E11.39 07/20/18   Scot Jun, FNP  Ostomy Supplies (SKIN TAC ADHESIVE BARRIER WIPE) MISC 1 each by Does not apply route daily. 11/25/17   Elayne Snare, MD    Physical Exam: Vitals:   09/27/19 0130 09/27/19 0145 09/27/19 0200 09/27/19 0230  BP: 99/62 97/67 94/65  106/82  Pulse: (!) 103 (!) 101 99 (!) 102  Resp:      Temp:      TempSrc:      SpO2: 99% 98% 99% 99%  Weight:      Height:        Physical Exam  Constitutional: She is oriented to person, place, and time. No distress.  HENT:  Head: Normocephalic.  Dry mucous membranes Dark dried emesis noted around the mouth and on the chest  Eyes: Right eye exhibits no discharge. Left eye exhibits no discharge.  Cardiovascular: Normal rate, regular rhythm and intact distal pulses.  Pulmonary/Chest: She is in respiratory distress. She has no wheezes. She has no rales.  Slightly tachypneic Satting well on 3 L supplemental oxygen  Abdominal: Soft. Bowel sounds are normal. She exhibits distension. There is no abdominal tenderness. There is no guarding.  Musculoskeletal:        General: Edema present.     Cervical back: Neck supple.     Comments: Right lower extremity BKA Left lower extremity +1 to +2 pitting  edema  Neurological: She is alert and oriented to person, place, and  time.  No focal motor or sensory deficit  Skin: Skin is warm and dry. She is not diaphoretic.    Labs on Admission: I have personally reviewed following labs and imaging studies  CBC: Recent Labs  Lab 09/26/19 1906  WBC 5.8  NEUTROABS 5.0  HGB 10.9*  HCT 33.6*  MCV 100.6*  PLT 568   Basic Metabolic Panel: Recent Labs  Lab 09/26/19 1906  NA 139  K 5.6*  CL 101  CO2 23  GLUCOSE 293*  BUN 42*  CREATININE 1.97*  CALCIUM 9.0   GFR: Estimated Creatinine Clearance: 31.9 mL/min (A) (by C-G formula based on SCr of 1.97 mg/dL (H)). Liver Function Tests: Recent Labs  Lab 09/26/19 1906  AST 32  ALT 33  ALKPHOS 102  BILITOT 0.9  PROT 7.0  ALBUMIN 3.4*   Recent Labs  Lab 09/26/19 1906  LIPASE 26   Recent Labs  Lab 09/26/19 1940  AMMONIA 25   Coagulation Profile: Recent Labs  Lab 09/26/19 1906  INR 1.1   Cardiac Enzymes: No results for input(s): CKTOTAL, CKMB, CKMBINDEX, TROPONINI in the last 168 hours. BNP (last 3 results) No results for input(s): PROBNP in the last 8760 hours. HbA1C: No results for input(s): HGBA1C in the last 72 hours. CBG: Recent Labs  Lab 09/27/19 0008  GLUCAP 312*   Lipid Profile: No results for input(s): CHOL, HDL, LDLCALC, TRIG, CHOLHDL, LDLDIRECT in the last 72 hours. Thyroid Function Tests: No results for input(s): TSH, T4TOTAL, FREET4, T3FREE, THYROIDAB in the last 72 hours. Anemia Panel: No results for input(s): VITAMINB12, FOLATE, FERRITIN, TIBC, IRON, RETICCTPCT in the last 72 hours. Urine analysis:    Component Value Date/Time   COLORURINE YELLOW 10/01/2016 1531   APPEARANCEUR CLEAR 10/01/2016 1531   LABSPEC >=1.030 (A) 10/01/2016 1531   PHURINE 5.5 10/01/2016 1531   GLUCOSEU NEGATIVE 10/01/2016 1531   HGBUR TRACE-INTACT (A) 10/01/2016 1531   BILIRUBINUR negative 07/20/2018 1607   BILIRUBINUR neg 02/12/2016 1350   KETONESUR negative 07/20/2018  1607   KETONESUR TRACE (A) 10/01/2016 1531   PROTEINUR positive +3 02/12/2016 1350   PROTEINUR 100 (A) 02/03/2016 1800   UROBILINOGEN 1.0 07/20/2018 1607   UROBILINOGEN 0.2 10/01/2016 1531   NITRITE Negative 07/20/2018 1607   NITRITE NEGATIVE 10/01/2016 1531   LEUKOCYTESUR Trace (A) 07/20/2018 1607    Radiological Exams on Admission: CT ABDOMEN PELVIS WO CONTRAST  Result Date: 09/26/2019 CLINICAL DATA:  Nausea, vomiting EXAM: CT ABDOMEN AND PELVIS WITHOUT CONTRAST TECHNIQUE: Multidetector CT imaging of the abdomen and pelvis was performed following the standard protocol without IV contrast. COMPARISON:  07/03/2016 FINDINGS: Lower chest: Calcified granuloma at the left lung base. Bibasilar scarring. No acute abnormality. Hepatobiliary: Small layering gallstones within the gallbladder, stable. Diffuse fatty infiltration throughout the liver. No focal hepatic abnormality. Pancreas: No focal abnormality or ductal dilatation. Spleen: No focal abnormality.  Normal size. Adrenals/Urinary Tract: Bilateral perinephric stranding. No renal or ureteral stones. No hydronephrosis adrenal glands and urinary bladder unremarkable. Stomach/Bowel: Normal appendix. Stomach, large and small bowel grossly unremarkable. Vascular/Lymphatic: Aortic atherosclerosis. No evidence of aneurysm or adenopathy. Reproductive: Prior hysterectomy.  No adnexal masses. Other: No free fluid or free air. Musculoskeletal: No acute bony abnormality. IMPRESSION: Cholelithiasis. Diffuse fatty infiltration of the liver. Aortic atherosclerosis. No acute findings in the abdomen or pelvis. Electronically Signed   By: Rolm Baptise M.D.   On: 09/26/2019 23:26   DG Chest Port 1 View  Result Date: 09/26/2019 CLINICAL DATA:  Sepsis EXAM: PORTABLE CHEST  1 VIEW COMPARISON:  06/27/2018 FINDINGS: The heart size and mediastinal contours are within normal limits. Heterogeneous airspace opacity of the left lung base. The visualized skeletal structures are  unremarkable. IMPRESSION: Heterogeneous airspace opacity of the left lung base, concerning for infection or aspiration. Electronically Signed   By: Eddie Candle M.D.   On: 09/26/2019 20:23    EKG: Independently reviewed.  Sinus tachycardia, heart rate 127.  RBBB and LAFB similar to prior tracing.  Rate increased since prior tracing.  Assessment/Plan Principal Problem:   Pneumonia Active Problems:   Severe sepsis (HCC)   Acute respiratory failure with hypoxia (H[facility])   Emesis   Hyperglycemia   Severe sepsis secondary to aspiration pneumonia: Febrile, tachycardic, and tachypneic on arrival.  Hypotensive with systolic as low as 27O.  Blood pressure and heart rate now improved after 2.5 L IV fluid boluses.  Labs showing no leukocytosis.  Lactic acid 4.0 >2.6 >2.3. Chest x-ray personally reviewed showing a heterogenous airspace opacity at the left lung base concerning for infection or aspiration.  SARS-CoV-2 PCR test negative. -Continue antibiotic coverage with Unasyn.  Continue IV fluid.  Tylenol as needed for fevers.  Blood culture x2 pending.  Check procalcitonin level.  Aspiration precautions, SLP eval in a.m.  Acute hypoxic respiratory failure secondary to aspiration pneumonia: Oxygen saturation in the high 80s on room air, improved with 3 L supplemental oxygen. -Continuous pulse ox.  Continue supplemental oxygen, wean as tolerated.  Dark/coffee-ground emesis: Hemoglobin currently 10.9, not significantly changed since labs done in February 2021 when it was ranging between 11.2-11.5.  Lipase and LFTs normal.  CT abdomen pelvis showing cholelithiasis without acute cholecystitis.  Patient has no abdominal tenderness on exam.  No prior EGD results in the chart.  No further episodes of emesis since she has been in the ED. -Gastroccult test pending.  Keep n.p.o. at this time.  IV fluid hydration.  IV PPI twice daily.  Antiemetic as needed.  Hold home aspirin.  Acute metabolic encephalopathy: Suspect  related to underlying infection/pneumonia.  Patient is currently AAO x3 and answering questions appropriately.  No focal motor or sensory deficit.  Ammonia level normal. -Management of pneumonia as mentioned above and continue to monitor mental status.  Hyperglycemia in the setting of insulin-dependent type 2 diabetes: Blood glucose 293.  Check A1c.  Order NovoLog 5 units at this time.  Sliding scale insulin every 4 hours as patient is currently n.p.o.  Mild hyperkalemia: Potassium 5.6. -Insulin as mentioned above.  Repeat BMP.  Continue cardiac monitoring.  Macrocytic anemia: Hemoglobin 10.9 and MCV 100.6.  Hemoglobin was ranging between 11.2-11.5 on labs done in February 2021.   -Gastroccult test as mentioned above to check for possible upper GI bleed.  Anemia panel ordered.  Repeat CBC in a.m.  AKI: Likely prerenal due to severe sepsis/hypotension. BUN 42, creatinine 1.9.  Baseline creatinine 1.1-1.2.  -IV fluid hydration.  Monitor renal function and urine output.  Avoid nephrotoxic agents.  DVT prophylaxis: SCDs Code Status: Full code Family Communication: No family available at this time. Disposition Plan: Status is: Inpatient  Remains inpatient appropriate because:IV treatments appropriate due to intensity of illness or inability to take PO   Dispo: The patient is from: Home              Anticipated d/c is to: Home              Anticipated d/c date is: 3 days  Patient currently is not medically stable to d/c.  The medical decision making on this patient was of high complexity and the patient is at high risk for clinical deterioration, therefore this is a level 3 visit.  Shela Leff MD [facility]  If 7PM-7AM, please contact night-coverage [email or web address]  09/27/2019, 3:21 AM

## 2019-09-27 ENCOUNTER — Encounter (HOSPITAL_COMMUNITY): Payer: Self-pay | Admitting: Internal Medicine

## 2019-09-27 DIAGNOSIS — E119 Type 2 diabetes mellitus without complications: Secondary | ICD-10-CM

## 2019-09-27 DIAGNOSIS — R1110 Vomiting, unspecified: Secondary | ICD-10-CM

## 2019-09-27 DIAGNOSIS — G9341 Metabolic encephalopathy: Secondary | ICD-10-CM

## 2019-09-27 DIAGNOSIS — J9601 Acute respiratory failure with hypoxia: Secondary | ICD-10-CM

## 2019-09-27 DIAGNOSIS — K920 Hematemesis: Secondary | ICD-10-CM

## 2019-09-27 DIAGNOSIS — A419 Sepsis, unspecified organism: Secondary | ICD-10-CM

## 2019-09-27 DIAGNOSIS — R6520 Severe sepsis without septic shock: Secondary | ICD-10-CM

## 2019-09-27 DIAGNOSIS — G92 Toxic encephalopathy: Secondary | ICD-10-CM

## 2019-09-27 DIAGNOSIS — Z794 Long term (current) use of insulin: Secondary | ICD-10-CM

## 2019-09-27 DIAGNOSIS — R739 Hyperglycemia, unspecified: Secondary | ICD-10-CM

## 2019-09-27 DIAGNOSIS — E875 Hyperkalemia: Secondary | ICD-10-CM

## 2019-09-27 LAB — BASIC METABOLIC PANEL
Anion gap: 13 (ref 5–15)
BUN: 42 mg/dL — ABNORMAL HIGH (ref 8–23)
CO2: 18 mmol/L — ABNORMAL LOW (ref 22–32)
Calcium: 7.9 mg/dL — ABNORMAL LOW (ref 8.9–10.3)
Chloride: 107 mmol/L (ref 98–111)
Creatinine, Ser: 2.04 mg/dL — ABNORMAL HIGH (ref 0.44–1.00)
GFR calc Af Amer: 30 mL/min — ABNORMAL LOW (ref >60)
GFR calc non Af Amer: 25 mL/min — ABNORMAL LOW (ref >60)
Glucose, Bld: 299 mg/dL — ABNORMAL HIGH (ref 70–99)
Potassium: 5.2 mmol/L — ABNORMAL HIGH (ref 3.5–5.1)
Sodium: 138 mmol/L (ref 135–145)

## 2019-09-27 LAB — FOLATE: Folate: 36.9 ng/mL (ref >5.9)

## 2019-09-27 LAB — URINALYSIS, ROUTINE W REFLEX MICROSCOPIC
Bilirubin Urine: NEGATIVE
Glucose, UA: NEGATIVE mg/dL
Hgb urine dipstick: NEGATIVE
Ketones, ur: NEGATIVE mg/dL
Nitrite: NEGATIVE
Protein, ur: 30 mg/dL — AB
Specific Gravity, Urine: 1.017 (ref 1.005–1.030)
pH: 5 (ref 5.0–8.0)

## 2019-09-27 LAB — IRON AND TIBC
Iron: 29 ug/dL (ref 28–170)
Saturation Ratios: 15 % (ref 10.4–31.8)
TIBC: 199 ug/dL — ABNORMAL LOW (ref 250–450)
UIBC: 170 ug/dL

## 2019-09-27 LAB — VITAMIN B12: Vitamin B-12: 284 pg/mL (ref 180–914)

## 2019-09-27 LAB — GLUCOSE, CAPILLARY
Glucose-Capillary: 200 mg/dL — ABNORMAL HIGH (ref 70–99)
Glucose-Capillary: 310 mg/dL — ABNORMAL HIGH (ref 70–99)
Glucose-Capillary: 303 mg/dL — ABNORMAL HIGH (ref 70–99)
Glucose-Capillary: 237 mg/dL — ABNORMAL HIGH (ref 70–99)

## 2019-09-27 LAB — SODIUM, URINE, RANDOM: Sodium, Ur: 26 mmol/L

## 2019-09-27 LAB — CBG MONITORING, ED
Glucose-Capillary: 228 mg/dL — ABNORMAL HIGH (ref 70–99)
Glucose-Capillary: 312 mg/dL — ABNORMAL HIGH (ref 70–99)
Glucose-Capillary: 328 mg/dL — ABNORMAL HIGH (ref 70–99)

## 2019-09-27 LAB — CREATININE, URINE, RANDOM: Creatinine, Urine: 100.01 mg/dL

## 2019-09-27 LAB — FERRITIN: Ferritin: 482 ng/mL — ABNORMAL HIGH (ref 11–307)

## 2019-09-27 LAB — LACTIC ACID, PLASMA: Lactic Acid, Venous: 2.3 mmol/L (ref 0.5–1.9)

## 2019-09-27 LAB — HIV ANTIBODY (ROUTINE TESTING W REFLEX): HIV Screen 4th Generation wRfx: NONREACTIVE

## 2019-09-27 LAB — PROCALCITONIN: Procalcitonin: 73.28 ng/mL

## 2019-09-27 MED ORDER — LAMOTRIGINE 100 MG PO TABS
100.0000 mg | ORAL_TABLET | Freq: Every day | ORAL | Status: DC
  Administered 2019-09-27 – 2019-10-02 (×6): 100 mg via ORAL
  Filled 2019-09-27 (×6): qty 1

## 2019-09-27 MED ORDER — HYDROCORTISONE 1 % EX CREA
TOPICAL_CREAM | Freq: Two times a day (BID) | CUTANEOUS | Status: DC
  Filled 2019-09-27: qty 28

## 2019-09-27 MED ORDER — VENLAFAXINE HCL ER 37.5 MG PO CP24
37.5000 mg | ORAL_CAPSULE | Freq: Every day | ORAL | Status: DC
  Administered 2019-09-29 – 2019-10-01 (×3): 37.5 mg via ORAL
  Filled 2019-09-27 (×5): qty 1

## 2019-09-27 MED ORDER — SODIUM CHLORIDE 0.9 % IV SOLN: INTRAVENOUS | Status: AC

## 2019-09-27 MED ORDER — LIDOCAINE VISCOUS HCL 2 % MT SOLN
15.0000 mL | Freq: Once | OROMUCOSAL | Status: AC
  Administered 2019-09-27: 15 mL via ORAL
  Filled 2019-09-27: qty 15

## 2019-09-27 MED ORDER — ALUM & MAG HYDROXIDE-SIMETH 200-200-20 MG/5ML PO SUSP
30.0000 mL | Freq: Once | ORAL | Status: AC
  Administered 2019-09-27: 30 mL via ORAL
  Filled 2019-09-27: qty 30

## 2019-09-27 MED ORDER — INSULIN DETEMIR 100 UNIT/ML ~~LOC~~ SOLN
20.0000 [IU] | Freq: Two times a day (BID) | SUBCUTANEOUS | Status: DC
  Administered 2019-09-27 – 2019-09-29 (×5): 20 [IU] via SUBCUTANEOUS
  Filled 2019-09-27 (×6): qty 0.2

## 2019-09-27 MED ORDER — FERROUS SULFATE 325 (65 FE) MG PO TABS
325.0000 mg | ORAL_TABLET | Freq: Every day | ORAL | Status: DC
  Administered 2019-09-28 – 2019-10-02 (×5): 325 mg via ORAL
  Filled 2019-09-27 (×5): qty 1

## 2019-09-27 MED ORDER — QUETIAPINE FUMARATE 100 MG PO TABS
200.0000 mg | ORAL_TABLET | Freq: Every day | ORAL | Status: DC
  Administered 2019-09-27 – 2019-09-28 (×2): 200 mg via ORAL
  Filled 2019-09-27 (×2): qty 2

## 2019-09-27 MED ORDER — ATORVASTATIN CALCIUM 10 MG PO TABS
20.0000 mg | ORAL_TABLET | Freq: Every day | ORAL | Status: DC
  Administered 2019-09-27 – 2019-10-01 (×5): 20 mg via ORAL
  Filled 2019-09-27 (×6): qty 2

## 2019-09-27 NOTE — ED Notes (Signed)
This RN paged Pharmacy to send missing medication. 

## 2019-09-27 NOTE — Progress Notes (Signed)
Inpatient Diabetes Program Recommendations  AACE/ADA: New Consensus Statement on Inpatient Glycemic Control (2015)  Target Ranges:  Prepandial:   less than 140 mg/dL      Peak postprandial:   less than 180 mg/dL (1-2 hours)      Critically ill patients:  140 - 180 mg/dL   Lab Results  Component Value Date   GLUCAP 228 (H) 09/27/2019   HGBA1C 8.9 (H) 07/20/2018    Review of Glycemic Control Results for [place], Dana [name] (MRN [id number]) as of 09/27/2019 10:52  Ref. Range 09/27/2019 00:08 09/27/2019 03:30 09/27/2019 09:25  Glucose-Capillary Latest Ref Range: 70 - 99 mg/dL 312 (H)  Novolog 11 units 328 (H)  Novolog 11 units 228 (H)   Diabetes history: DM 2 Outpatient Diabetes medications: Metformin 1000 mg bid, Januvia 50 mg Daily, (see U-500 45 units tid insulin and Novolin NPH 75 units tid insulin on home med rec unsure if pt is taking either of those) Current orders for Inpatient glycemic control:  Novolog 0-15 units Q4 hours  Clear liquid diet  Inpatient Diabetes Program Recommendations:    Consider starting Levemir 20 units bid  Thanks,  Tama Headings RN, MSN, BC-ADM Inpatient Diabetes Coordinator Team Pager [phone] (8a-5p)

## 2019-09-27 NOTE — ED Notes (Signed)
Pt aware of UA needed. Pt states she is trying but can not at this time

## 2019-09-27 NOTE — Progress Notes (Addendum)
PROGRESS NOTE    Dana [name]  FFM:[id number] DOB: 08-Aug-1955 DOA: 09/26/2019 PCP: [name], DO    Chief Complaint  Patient presents with  . Altered Mental Status  . Vomiting  . Nausea    Brief Narrative:  HPI per Dr. Rathore Malyah B [name] is a 64 y.o. female with medical history significant of bipolar 1 disorder, type 2 diabetes, diabetic neuropathy, anemia, iron malabsorption, hypertension, hyperlipidemia presenting to the ED via EMS for evaluation of altered mental status and vomiting.  EMS reported that they were called out to the patient's house by family for progressively worsening altered mental status and vomiting all day long.  She is found to be very lethargic.  She has had multiple episodes of dark emesis.  EMS reported that she was tachycardic to the 130s, blood glucose 349, and temperature 103 en route.  Patient states she has been vomiting all day and feeling hot.  Her vomit was dark in color.  Denies abdominal pain.  States her last bowel movement was a few days ago.  States she had a temperature above 100 F this morning.  Denies cough, shortness of breath, or chest pain.  States she is no longer nauseous since she has been in the ED. Reports aspirating food the other day when she was trying to eat.  She has no other complaints.  ED Course: Febrile with T-max 103.2 F.  Tachycardic with heart rate in the 120s.  Slightly tachypneic.  Hypotensive.  Labs showing no leukocytosis.  Lactic acid 4.0 >2.6 with IVF.  Hemoglobin 10.9 and MCV 100.6.  Hemoglobin was ranging between 11.2-11.5 on labs done in February 2021.  Potassium 5.6.  Blood glucose 293.  Bicarb 23, anion gap 15.  BUN 42, creatinine 1.9.  Baseline creatinine 1.1-1.2.  Lipase and LFTs normal.  INR 1.1.  Ammonia level normal.  SARS-CoV-2 PCR test negative.  UA and urine culture pending.  Blood culture x2 pending.  Chest x-ray personally reviewed showing a heterogenous airspace opacity at the left lung base  concerning for infection or aspiration.  Patient was given Tylenol, vancomycin, cefepime, metronidazole, and 1.5 L normal saline boluses.  Assessment & Plan:   Principal Problem:   Pneumonia Active Problems:   Severe sepsis (HCC)   Acute respiratory failure with hypoxia (HCC)   Emesis   Hyperglycemia   Sepsis ([place])  1 severe sepsis secondary to aspiration pneumonia and acute organ dysfunction, POA Patient had presented with fever, tachycardia, tachypnea on arrival.  Patient noted to be hypotensive on admission with systolics in the 57S.  Blood pressure responded to IV fluids.  Lactic acid elevated on admission but trending down.  Chest x-ray concerning for left basilar infiltrate.  SARS coronavirus PCR test negative.  Patient pancultured with cultures pending.  Procalcitonin elevated at 73.28.  Check urine Legionella antigen.  Check a urine pneumococcus antigen.  Patient on empiric IV Unasyn which we will continue.  IV fluids.  Supportive care.  Follow.  2.  Acute hyoxic respiratory failure secondary to aspiration pneumonia Patient noted to have O2 sats of high 80s on room air improved on 3 L nasal cannula.  Patient pancultured.  Check a urine Legionella antigen, check a urine pneumococcus antigen.  Continue empiric IV Unasyn.  Follow.  3.  Dark/coffee-ground emesis Hemoglobin is stable.  No significant change since February 2021.  Lipase levels LFTs within normal limits.  CT abdomen and pelvis with cholelithiasis without acute cholecystitis.  Some perinephric stranding noted.  Patient  with no CVA tenderness.  No abdominal pain.  Placed on clear liquids.  IV fluids.  IV PPI twice daily.  Antiemetics.  Supportive care.  4.  Acute metabolic encephalopathy Likely secondary to problem #1.  Improving clinically.  Patient alert oriented to self place and time.  IV antibiotics.  Supportive care.  5.  Hyperglycemia in the setting of insulin-dependent diabetes mellitus type 2 Hemoglobin A1c  pending.  CBG of 228 this morning.  Placed on Levemir 20 units twice daily.  Continue sliding scale insulin.  Follow.  6.  Mild hyperkalemia Potassium of 5.2.  Follow.  Repeat labs in the morning.  7.  Macrocytic anemia/AOCD H&H currently stable.  Gastroccult test pending.  Anemia panel of chronic disease.  Follow H&H.  8.  Acute renal failure Likely secondary to a prerenal azotemia in the setting of diuretics.  Patient noted to be hypotensive on admission.  Diuretics on hold.  Patient on IV fluids.  Check a fractional excretion of sodium.  UA pending.  CT abdomen and pelvis done negative for hydronephrosis.  DVT prophylaxis: SCDs Code Status: Full Family Communication: Updated patient.  No family at bedside. Disposition:   Status is: Inpatient    Dispo: The patient is from: Home              Anticipated d/c is to: Home              Anticipated d/c date is: In about 3 to 4 days.              Patient currently admitted with sepsis.  Pancultured with results pending.  On IV antibiotics.  Urinalysis pending.  Improvement of mental status.        Consultants:   None  Procedures:  CT abdomen and pelvis 09/26/2019  Chest x-ray 09/26/2019  Antimicrobials:   IV Unasyn 09/26/2019   Subjective: Patient sitting up in bed.  More alert.  States she is feeling better.  Alert and oriented to self place and time.  Denies any chest pain.  No shortness of breath.  Objective: Vitals:   09/27/19 0950 09/27/19 1100 09/27/19 1200 09/27/19 1210  BP: (!) 100/54   114/62  Pulse: 99 100 100 99  Resp: 19 18 16 18   Temp:    98.8 F (37.1 C)  TempSrc:    Oral  SpO2: 96% 100% 100% 99%  Weight:    100.4 kg  Height:    5\' 1"  (1.549 m)    Intake/Output Summary (Last 24 hours) at 09/27/2019 1246 Last data filed at 09/27/2019 0904 Gross per 24 hour  Intake 1100 ml  Output -  Net 1100 ml   Filed Weights   09/26/19 1903 09/27/19 1210  Weight: 99 kg 100.4 kg    Examination:  General  exam: Appears calm and comfortable  Respiratory system: Coarse breath sounds in the left base.  No wheezing.  Normal respiratory effort. Cardiovascular system: S1 & S2 heard, RRR. No JVD, murmurs, rubs, gallops or clicks. No pedal edema. Gastrointestinal system: Abdomen is nondistended, soft and nontender. No organomegaly or masses felt. Normal bowel sounds heard. Central nervous system: Alert and oriented. No focal neurological deficits. Extremities: Status post right BKA.  Left lower extremity with trace to 1+ edema.  Skin: No rashes, lesions or ulcers Psychiatry: Judgement and insight appear normal. Mood & affect appropriate.     Data Reviewed: I have personally reviewed following labs and imaging studies  CBC: Recent Labs  Lab 09/26/19 1906  WBC 5.8  NEUTROABS 5.0  HGB 10.9*  HCT 33.6*  MCV 100.6*  PLT 630    Basic Metabolic Panel: Recent Labs  Lab 09/26/19 1906 09/27/19 0433  NA 139 138  K 5.6* 5.2*  CL 101 107  CO2 23 18*  GLUCOSE 293* 299*  BUN 42* 42*  CREATININE 1.97* 2.04*  CALCIUM 9.0 7.9*    GFR: Estimated Creatinine Clearance: 31.1 mL/min (A) (by C-G formula based on SCr of 2.04 mg/dL (H)).  Liver Function Tests: Recent Labs  Lab 09/26/19 1906  AST 32  ALT 33  ALKPHOS 102  BILITOT 0.9  PROT 7.0  ALBUMIN 3.4*    CBG: Recent Labs  Lab 09/27/19 0008 09/27/19 0330 09/27/19 0925 09/27/19 1208  GLUCAP 312* 328* 228* 200*     Recent Results (from the past 240 hour(s))  Blood Culture (routine x 2)     Status: None (Preliminary result)   Collection Time: 09/26/19  7:22 PM   Specimen: BLOOD LEFT FOREARM  Result Value Ref Range Status   Specimen Description BLOOD LEFT FOREARM  Final   Special Requests   Final    BOTTLES DRAWN AEROBIC AND ANAEROBIC Blood Culture adequate volume   Culture   Final    NO GROWTH < 12 HOURS Performed at [facility], [place] [address] [place], [place] [address]    Report Status PENDING  Incomplete  Blood  Culture (routine x 2)     Status: None (Preliminary result)   Collection Time: 09/26/19  7:40 PM   Specimen: BLOOD  Result Value Ref Range Status   Specimen Description BLOOD LEFT WRIST  Final   Special Requests   Final    BOTTLES DRAWN AEROBIC AND ANAEROBIC Blood Culture results may not be optimal due to an inadequate volume of blood received in culture bottles   Culture   Final    NO GROWTH < 12 HOURS Performed at [facility], [place] [address], [place], [place] [address]    Report Status PENDING  Incomplete  SARS Coronavirus 2 by RT PCR (hospital order, performed in [facility] hospital lab) Nasopharyngeal Nasopharyngeal Swab     Status: None   Collection Time: 09/26/19  7:41 PM   Specimen: Nasopharyngeal Swab  Result Value Ref Range Status   SARS Coronavirus 2 NEGATIVE NEGATIVE Final    Comment: (NOTE) SARS-CoV-2 target nucleic acids are NOT DETECTED. The SARS-CoV-2 RNA is generally detectable in upper and lower respiratory specimens during the acute phase of infection. The lowest concentration of SARS-CoV-2 viral copies this assay can detect is 250 copies / mL. A negative result does not preclude SARS-CoV-2 infection and should not be used as the sole basis for treatment or other patient management decisions.  A negative result may occur with improper specimen collection / handling, submission of specimen other than nasopharyngeal swab, presence of viral mutation(s) within the areas targeted by this assay, and inadequate number of viral copies (<250 copies / mL). A negative result must be combined with clinical observations, patient history, and epidemiological information. Fact Sheet for Patients:   StrictlyIdeas.no Fact Sheet for Healthcare Providers: BankingDealers.co.za This test is not yet approved or cleared  by the [place] FDA and has been authorized for detection and/or diagnosis of SARS-CoV-2 by FDA under an  Emergency Use Authorization (EUA).  This EUA will remain in effect (meaning this test can be used) for the duration of the COVID-19 declaration under Section 564(b)(1) of the Act, 21 U.S.C.  section 360bbb-3(b)(1), unless the authorization is terminated or revoked sooner. Performed at [facility], [place] [address], [place], [place] [address]          Radiology Studies: CT ABDOMEN PELVIS WO CONTRAST  Result Date: 09/26/2019 CLINICAL DATA:  Nausea, vomiting EXAM: CT ABDOMEN AND PELVIS WITHOUT CONTRAST TECHNIQUE: Multidetector CT imaging of the abdomen and pelvis was performed following the standard protocol without IV contrast. COMPARISON:  07/03/2016 FINDINGS: Lower chest: Calcified granuloma at the left lung base. Bibasilar scarring. No acute abnormality. Hepatobiliary: Small layering gallstones within the gallbladder, stable. Diffuse fatty infiltration throughout the liver. No focal hepatic abnormality. Pancreas: No focal abnormality or ductal dilatation. Spleen: No focal abnormality.  Normal size. Adrenals/Urinary Tract: Bilateral perinephric stranding. No renal or ureteral stones. No hydronephrosis adrenal glands and urinary bladder unremarkable. Stomach/Bowel: Normal appendix. Stomach, large and small bowel grossly unremarkable. Vascular/Lymphatic: Aortic atherosclerosis. No evidence of aneurysm or adenopathy. Reproductive: Prior hysterectomy.  No adnexal masses. Other: No free fluid or free air. Musculoskeletal: No acute bony abnormality. IMPRESSION: Cholelithiasis. Diffuse fatty infiltration of the liver. Aortic atherosclerosis. No acute findings in the abdomen or pelvis. Electronically Signed   By: Rolm Baptise M.D.   On: 09/26/2019 23:26   DG Chest Port 1 View  Result Date: 09/26/2019 CLINICAL DATA:  Sepsis EXAM: PORTABLE CHEST 1 VIEW COMPARISON:  06/27/2018 FINDINGS: The heart size and mediastinal contours are within normal limits. Heterogeneous airspace opacity of the left lung  base. The visualized skeletal structures are unremarkable. IMPRESSION: Heterogeneous airspace opacity of the left lung base, concerning for infection or aspiration. Electronically Signed   By: Eddie Candle M.D.   On: 09/26/2019 20:23        Scheduled Meds: . insulin aspart  0-15 Units Subcutaneous Q4H  . insulin aspart  5 Units Subcutaneous Once  . pantoprazole (PROTONIX) IV  40 mg Intravenous Q12H   Continuous Infusions: . sodium chloride 125 mL/hr at 09/27/19 0904  . ampicillin-sulbactam (UNASYN) IV Stopped (09/27/19 0743)     LOS: 1 day    Time spent: 40 minutes    Irine Seal, MD [facility]   To contact the attending provider between 7A-7P or the covering provider during after hours 7P-7A, please log into the web site [email or web address] and access using universal [place] password for that web site. If you do not have the password, please call the hospital operator.  09/27/2019, 12:46 PM

## 2019-09-27 NOTE — Evaluation (Signed)
Clinical/Bedside Swallow Evaluation Patient Details  Name: Dana [name] MRN: [id number] Date of Birth: 1956/04/19  Today's Date: 09/27/2019 Time: SLP Start Time (ACUTE ONLY): 1000 SLP Stop Time (ACUTE ONLY): 1010 SLP Time Calculation (min) (ACUTE ONLY): 10 min  Past Medical History:  Past Medical History:  Diagnosis Date  . Bipolar 1 disorder ([place])   . Cellulitis and abscess of foot 03/08/2015  . Diabetes mellitus    INSULIN DEPENDENT  . Diabetic neuropathy ([place])   . 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 H/O hiatal hernia   . Hyperlipidemia   . Hypertension    past hx of  . Iron malabsorption 09/26/2015  . Numbness and tingling    Hx; of in B/LLE and B/LUE  . Other iron deficiency anemias 09/20/2015   Past Surgical History:  Past Surgical History:  Procedure Laterality Date  . ABDOMINAL HYSTERECTOMY    . ADENOIDECTOMY     Hx: of  . AMPUTATION  10/24/2011   Procedure: AMPUTATION RAY;  Surgeon: Newt Minion, MD;  Location: [facility];  Service: Orthopedics;  Laterality: Right;  Right Foot 3rd Ray Amputation  . AMPUTATION Right 12/16/2012   Procedure: Right Foot Transmetatarsal Amputation;  Surgeon: Newt Minion, MD;  Location: [facility];  Service: Orthopedics;  Laterality: Right;  . AMPUTATION Left 03/09/2015   Procedure: LEFT FOOT 1ST RAY AMPUTATION;  Surgeon: Newt Minion, MD;  Location: [facility];  Service: Orthopedics;  Laterality: Left;  . BLADDER SURGERY     x 2, tacked 1st time; mesh "eroded", had to be removed  . Bladder Tact   2002  . BREAST SURGERY Left    "knot removed"  . CARPAL TUNNEL RELEASE Left   . COLON SURGERY    . EYE SURGERY  11/17/2017  . NASAL SEPTUM SURGERY  1976  . TONSILLECTOMY     age 46's   HPI:  Dana [name] is a 64 y.o. female with medical history significant of bipolar 1 disorder, type 2 diabetes, diabetic neuropathy, anemia, iron malabsorption, hypertension, hyperlipidemia presenting to the ED via EMS for evaluation of altered mental status  and vomiting. Patient states she has been vomiting all day and feeling hot.  Her vomit was dark in color.  Denies abdominal pain.  States her last bowel movement was a few days ago.  States she had a temperature above 100 F. Chest x-ray showing a heterogenous airspace opacity at the left lung base concerning for infection or aspiration.     Assessment / Plan / Recommendation Clinical Impression  Pt demonstrates no signs of aspiraiton to indicate dysphagia related aspiration pna. She does have difficulty masticating with only a top denture and also reports increasing globus recently with solids. She reports a history of esophageal stricture. When she is cleared for diet advancement, recommend soft solids. She may benefit from f/u with GI to evaluate for stricture. No acute SLP needs, Will sign off.  SLP Visit Diagnosis: Dysphagia, oral phase (R13.11)    Aspiration Risk  Mild aspiration risk    Diet Recommendation Dysphagia 3 (Mech soft);Thin liquid   Liquid Administration via: Cup;Straw Medication Administration: Whole meds with liquid Supervision: Patient able to self feed Compensations: Slow rate;Small sips/bites Postural Changes: Seated upright at 90 degrees    Other  Recommendations Recommended Consults: Consider GI evaluation   Follow up Recommendations None      Frequency and Duration            Prognosis  Swallow Study   General HPI: Dana [name] is a 64 y.o. female with medical history significant of bipolar 1 disorder, type 2 diabetes, diabetic neuropathy, anemia, iron malabsorption, hypertension, hyperlipidemia presenting to the ED via EMS for evaluation of altered mental status and vomiting. Patient states she has been vomiting all day and feeling hot.  Her vomit was dark in color.  Denies abdominal pain.  States her last bowel movement was a few days ago.  States she had a temperature above 100 F. Chest x-ray showing a heterogenous airspace opacity at the left  lung base concerning for infection or aspiration.   Type of Study: Bedside Swallow Evaluation Previous Swallow Assessment: none Diet Prior to this Study: Thin liquids Temperature Spikes Noted: No Respiratory Status: Room air History of Recent Intubation: No Behavior/Cognition: Alert;Cooperative Oral Cavity Assessment: Within Functional Limits Oral Care Completed by SLP: No Oral Cavity - Dentition: Dentures, top;Edentulous Vision: Functional for self-feeding Self-Feeding Abilities: Able to feed self Patient Positioning: Upright in bed Baseline Vocal Quality: Normal Volitional Cough: Strong    Oral/Motor/Sensory Function Overall Oral Motor/Sensory Function: Within functional limits   Ice Chips     Thin Liquid Thin Liquid: Within functional limits    Nectar Thick Nectar Thick Liquid: Not tested   Honey Thick Honey Thick Liquid: Not tested   Puree Puree: Within functional limits   Solid     Solid: Impaired Presentation: Self Fed Oral Phase Functional Implications: Impaired mastication     Herbie Baltimore, MA CCC-SLP  Acute Rehabilitation Services Pager [phone] Office [phone]  Lynann Beaver 09/27/2019,11:17 AM

## 2019-09-27 NOTE — Evaluation (Signed)
Physical Therapy Evaluation Patient Details Name: Dana [name] MRN: [id number] DOB: 11-Dec-1955 Today's Date: 09/27/2019   History of Present Illness  64 y.o. female with medical history significant of bipolar 1 disorder, type 2 diabetes, diabetic neuropathy, R BKA, L first ray amputation, anemia, iron malabsorption, hypertension, hyperlipidemia presenting to the ED via EMS on 5/17 with AMS, vomiting. Pt with diagnosis of severe sepsis secondary to suspected aspiration PNA.  Clinical Impression   Pt presents with generalized weakness, dyspnea on exertion requiring supplemental O2, increased time and effort to mobilize, impaired standing balance vs baseline, and decreased activity tolerance. Pt to benefit from acute PT to address deficits. Pt ambulated room distance with close guard from PT, pt requiring use of RW today and at baseline pt ambulates with no AD. PT recommending HHPT to address mobility deficits post-acutely, per pt her family can provide increased supervision as needed upon d/c home. PT to progress mobility as tolerated, and will continue to follow acutely.     Follow Up Recommendations Home health PT;Supervision for mobility/OOB    Equipment Recommendations  Rolling walker with 5" wheels    Recommendations for Other Services       Precautions / Restrictions Precautions Precautions: Fall Restrictions Weight Bearing Restrictions: No RLE Weight Bearing: Weight bearing as tolerated(RBKA)      Mobility  Bed Mobility Overal bed mobility: Needs Assistance Bed Mobility: Supine to Sit     Supine to sit: Supervision     General bed mobility comments: for safety, use of bedrails and HOB elevation to come to sitting  Transfers Overall transfer level: Needs assistance Equipment used: Rolling walker (2 wheeled) Transfers: Sit to/from Stand Sit to Stand: Min guard;From elevated surface         General transfer comment: for safety, verbal cuing for hand placement when  rising. Pt able to don RLE prosthesis without PT assist and increased time prior to initiating stand.  Ambulation/Gait Ambulation/Gait assistance: Min guard Gait Distance (Feet): 30 Feet Assistive device: Rolling walker (2 wheeled) Gait Pattern/deviations: Step-through pattern;Decreased stride length;Trunk flexed Gait velocity: decr   General Gait Details: Min guard for safety, verbal cuing for correct use of RW as pt not used to using AD. Slow, steady gait with PT assisting with lines/leads. SpO2 97% and greater on 3LO2, DOE 2/4.  Stairs            Wheelchair Mobility    Modified Rankin (Stroke Patients Only)       Balance Overall balance assessment: Needs assistance Sitting-balance support: No upper extremity supported;Feet supported Sitting balance-Leahy Scale: Good     Standing balance support: Bilateral upper extremity supported Standing balance-Leahy Scale: Poor Standing balance comment: reliant on external support                             Pertinent Vitals/Pain Pain Assessment: Faces Faces Pain Scale: No hurt Pain Intervention(s): Monitored during session    Home Living Family/patient expects to be discharged to:: Private residence Living Arrangements: Parent;Other relatives Available Help at Discharge: Family(mother, sister, both of which whom work) Type of Home: House Home Access: Ramped entrance;Stairs to enter Entrance Stairs-Rails: Right Entrance Stairs-Number of Steps: [place]: One level [place]: Grab bars - toilet;Grab bars - tub/shower;Shower seat      Prior Function Level of Independence: Independent   Gait / Transfers Assistance Needed: Pt reports ambulating without assist, does not use AD and states she does not have  any (per chart review pt used to have w/c, RW)           Hand Dominance   Dominant Hand: Right    Extremity/Trunk Assessment   Upper Extremity Assessment Upper Extremity Assessment: Defer to  OT evaluation    Lower Extremity Assessment Lower Extremity Assessment: Generalized weakness;RLE deficits/detail RLE Deficits / Details: chronic BKA, fair functional strength with use of prosthesis RLE Sensation: history of peripheral neuropathy    Cervical / Trunk Assessment Cervical / Trunk Assessment: Normal  Communication   Communication: No difficulties  Cognition Arousal/Alertness: Awake/alert Behavior During Therapy: WFL for tasks assessed/performed Overall Cognitive Status: Within Functional Limits for tasks assessed                                 General Comments: pt is pleasant, cooperative during PT. Pt motivated to mobilize because pt likes to be independent.      General Comments      Exercises     Assessment/Plan    PT Assessment Patient needs continued PT services  PT Problem List Decreased strength;Decreased mobility;Decreased range of motion;Cardiopulmonary status limiting activity;Decreased activity tolerance;Decreased balance;Decreased knowledge of use of DME;Obesity       PT Treatment Interventions DME instruction;Therapeutic activities;Gait training;Therapeutic exercise;Patient/family education;Balance training;Stair training;Functional mobility training;Neuromuscular re-education    PT Goals (Current goals can be found in the Care Plan section)  Acute Rehab PT Goals Patient Stated Goal: go home PT Goal Formulation: With patient Time For Goal Achievement: 10/11/19 Potential to Achieve Goals: Good    Frequency Min 3X/week   Barriers to discharge        Co-evaluation               AM-PAC PT "6 Clicks" Mobility  Outcome Measure Help needed turning from your back to your side while in a flat bed without using bedrails?: A Little Help needed moving from lying on your back to sitting on the side of a flat bed without using bedrails?: A Little Help needed moving to and from a bed to a chair (including a wheelchair)?: A  Little Help needed standing up from a chair using your arms (e.g., wheelchair or bedside chair)?: A Little Help needed to walk in hospital room?: A Little Help needed climbing 3-5 steps with a railing? : A Lot 6 Click Score: 17    End of Session Equipment Utilized During Treatment: Gait belt;Oxygen Activity Tolerance: Patient tolerated treatment well;Patient limited by fatigue Patient left: in chair;with chair alarm set;with call bell/phone within reach Nurse Communication: Mobility status PT Visit Diagnosis: Other abnormalities of gait and mobility (R26.89);Muscle weakness (generalized) (M62.81)    Time: 2751-7001 PT Time Calculation (min) (ACUTE ONLY): 29 min   Charges:   PT Evaluation $PT Eval Low Complexity: 1 Low PT Treatments $Gait Training: 8-22 mins       Aleshia Cartelli E, PT Acute Rehabilitation Services Pager [phone]  Office [phone]  Loralai Eisman D Elonda Husky 09/27/2019, 4:03 PM

## 2019-09-28 LAB — URINE CULTURE: Culture: NO GROWTH

## 2019-09-28 LAB — CBC WITH DIFFERENTIAL/PLATELET
Abs Immature Granulocytes: 0.06 10*3/uL (ref 0.00–0.07)
Basophils Absolute: 0 10*3/uL (ref 0.0–0.1)
Basophils Relative: 1 %
Eosinophils Absolute: 1 10*3/uL — ABNORMAL HIGH (ref 0.0–0.5)
Eosinophils Relative: 12 %
HCT: 27.1 % — ABNORMAL LOW (ref 36.0–46.0)
Hemoglobin: 8.7 g/dL — ABNORMAL LOW (ref 12.0–15.0)
Immature Granulocytes: 1 %
Lymphocytes Relative: 18 %
Lymphs Abs: 1.5 10*3/uL (ref 0.7–4.0)
MCH: 32.7 pg (ref 26.0–34.0)
MCHC: 32.1 g/dL (ref 30.0–36.0)
MCV: 101.9 fL — ABNORMAL HIGH (ref 80.0–100.0)
Monocytes Absolute: 0.8 10*3/uL (ref 0.1–1.0)
Monocytes Relative: 10 %
Neutro Abs: 4.9 10*3/uL (ref 1.7–7.7)
Neutrophils Relative %: 58 %
Platelets: 189 10*3/uL (ref 150–400)
RBC: 2.66 MIL/uL — ABNORMAL LOW (ref 3.87–5.11)
RDW: 15.3 % (ref 11.5–15.5)
WBC: 8.4 10*3/uL (ref 4.0–10.5)
nRBC: 0 % (ref 0.0–0.2)

## 2019-09-28 LAB — RENAL FUNCTION PANEL
Albumin: 2.8 g/dL — ABNORMAL LOW (ref 3.5–5.0)
Anion gap: 11 (ref 5–15)
BUN: 31 mg/dL — ABNORMAL HIGH (ref 8–23)
CO2: 20 mmol/L — ABNORMAL LOW (ref 22–32)
Calcium: 8 mg/dL — ABNORMAL LOW (ref 8.9–10.3)
Chloride: 109 mmol/L (ref 98–111)
Creatinine, Ser: 1.48 mg/dL — ABNORMAL HIGH (ref 0.44–1.00)
GFR calc Af Amer: 44 mL/min — ABNORMAL LOW (ref >60)
GFR calc non Af Amer: 38 mL/min — ABNORMAL LOW (ref >60)
Glucose, Bld: 196 mg/dL — ABNORMAL HIGH (ref 70–99)
Phosphorus: 2.6 mg/dL (ref 2.5–4.6)
Potassium: 4.1 mmol/L (ref 3.5–5.1)
Sodium: 140 mmol/L (ref 135–145)

## 2019-09-28 LAB — GLUCOSE, CAPILLARY
Glucose-Capillary: 142 mg/dL — ABNORMAL HIGH (ref 70–99)
Glucose-Capillary: 157 mg/dL — ABNORMAL HIGH (ref 70–99)
Glucose-Capillary: 233 mg/dL — ABNORMAL HIGH (ref 70–99)
Glucose-Capillary: 253 mg/dL — ABNORMAL HIGH (ref 70–99)
Glucose-Capillary: 293 mg/dL — ABNORMAL HIGH (ref 70–99)
Glucose-Capillary: 299 mg/dL — ABNORMAL HIGH (ref 70–99)

## 2019-09-28 LAB — BLOOD CULTURE ID PANEL (REFLEXED)

## 2019-09-28 LAB — HEMOGLOBIN A1C
Hgb A1c MFr Bld: 7.2 % — ABNORMAL HIGH (ref 4.8–5.6)
Mean Plasma Glucose: 160 mg/dL

## 2019-09-28 MED ORDER — BUSPIRONE HCL 10 MG PO TABS
10.0000 mg | ORAL_TABLET | Freq: Two times a day (BID) | ORAL | Status: DC
  Administered 2019-09-28 – 2019-09-30 (×5): 10 mg via ORAL
  Filled 2019-09-28 (×5): qty 1

## 2019-09-28 MED ORDER — GABAPENTIN 800 MG PO TABS
400.0000 mg | ORAL_TABLET | Freq: Three times a day (TID) | ORAL | Status: DC
  Filled 2019-09-28 (×3): qty 0.5

## 2019-09-28 MED ORDER — GABAPENTIN 400 MG PO CAPS
400.0000 mg | ORAL_CAPSULE | Freq: Three times a day (TID) | ORAL | Status: DC
  Administered 2019-09-28 – 2019-10-02 (×14): 400 mg via ORAL
  Filled 2019-09-28 (×15): qty 1

## 2019-09-28 MED ORDER — PANTOPRAZOLE SODIUM 40 MG PO TBEC
40.0000 mg | DELAYED_RELEASE_TABLET | Freq: Every day | ORAL | Status: DC
  Administered 2019-09-29 – 2019-10-02 (×4): 40 mg via ORAL
  Filled 2019-09-28 (×4): qty 1

## 2019-09-28 MED ORDER — GUAIFENESIN 100 MG/5ML PO SOLN
5.0000 mL | Freq: Four times a day (QID) | ORAL | Status: DC | PRN
  Administered 2019-09-28: 100 mg via ORAL
  Filled 2019-09-28: qty 5

## 2019-09-28 MED ORDER — FREESTYLE LANCETS MISC: 1.0000 | Freq: Every day | Status: DC

## 2019-09-28 MED ORDER — METFORMIN HCL ER 500 MG PO TB24
1000.0000 mg | ORAL_TABLET | Freq: Two times a day (BID) | ORAL | Status: DC
  Administered 2019-09-28 – 2019-09-30 (×4): 1000 mg via ORAL
  Filled 2019-09-28 (×5): qty 2

## 2019-09-28 NOTE — Progress Notes (Signed)
PROGRESS NOTE    Dana [name]  YTK:[id number] DOB: 06-Jun-1955 DOA: 09/26/2019 PCP: [name], DO    Chief Complaint  Patient presents with  . Altered Mental Status  . Vomiting  . Nausea    Brief Narrative:  [age] female bipolar IDDM status post right BKA with nephropathy HLD iron deficiency anemia HTN admit 5/17 after several days N, V, lethargy T-max 103.0 tachycardia 130 glucose 350 On admission?  Bloody emesis  Reports aspirating food the other day when she was trying to eat.  She has no other complaints.  ED Course: Febrile with T-max 103.2 F.  Tachycardic with heart rate in the 120s.  Slightly tachypneic.  Hypotensive.  Labs showing no leukocytosis.  Lactic acid 4.0 >2.6 with IVF.  Hemoglobin 10.9 and MCV 100.6.  Hemoglobin was ranging between 11.2-11.5 on labs done in February 2021.  Potassium 5.6.  Blood glucose 293.  Bicarb 23, anion gap 15.  BUN 42, creatinine 1.9.  Baseline creatinine 1.1-1.2.  Lipase and LFTs normal.  INR 1.1.  Ammonia level normal.  SARS-CoV-2 PCR test negative.  UA and urine culture pending.  Blood culture x2 pending.  Chest x-ray personally reviewed showing a heterogenous airspace opacity at the left lung base concerning for infection or aspiration.  Patient was given Tylenol, vancomycin, cefepime, metronidazole, and 1.5 L normal saline boluses.  Assessment & Plan:   Principal Problem:   Pneumonia Active Problems:   Type 2 diabetes mellitus without complication, with long-term current use of insulin (HCC)   Severe sepsis (HCC)   Acute respiratory failure with hypoxia (HCC)   Emesis   Hyperglycemia   Sepsis (HCC)   Toxic metabolic encephalopathy   Coffee ground emesis   Hyperkalemia  1 severe sepsis secondary to aspiration pneumonia and acute organ dysfunction, POA Presented with SIRS criterion, hypotension 80s Lactic acid now down.  Chest x-ray concerning for left basilar infiltrate.   Procalcitonin elevated at 73.28.  Urine culture  negative, urine Legionella antigen,  pneumococcus antigen have not been collected empiric IV Unasyn which we will continue for now and transition may be to Augmentin on 5/20 a.m.  2.  Acute hyoxic respiratory failure secondary to aspiration pneumonia Patient noted to have O2 sats of high 80s on room air improved on 3 L nasal cannula.   Wires desat screen prior to discharge  3.  Dark/coffee-ground emesis Hemoglobin is stable.  No significant change since February 2021.  Lipase levels LFTs within normal limits.   CT abdomen and pelvis with cholelithiasis without acute cholecystitis.   Some perinephric stranding noted- no CVA tenderness.  No abdominal pain.  Diet graduated to dysphagia 3 today as no further dark vomit and feel that she was having bilious rather than bloody vomiting  4.  Acute metabolic encephalopathy On multiple medications with sedative potential such as BuSpar, gabapentin, Seroquel, Effexor Likely secondary to problem #1.  Improving clinically Have resumed at lower doses of BuSpar at 10 twice daily, resumed her gabapentin at 400 rather than 1200 3 times daily Continue Seroquel 200 at bedtime and Effexor 37.5 every morning-monitor for sedation I have held her Depakote indefinitely  5.  Hyperglycemia in the setting of insulin-dependent diabetes mellitus type 2 Hemoglobin A1c pending.  CBG of 228 this morning.  Placed on Levemir 20 units twice daily. Today have resume Metformin 1000 twice daily, Januvia 50 daily  6.  Mild hyperkalemia Potassium of 5.2.  Follow.  Repeat labs in the morning.  7.  Macrocytic anemia/AOCD  H&H currently stable.  Gastroccult test pending.  Anemia panel of chronic disease.  Follow H&H.  8.  Acute renal failure with hyperkalemia on admission Likely secondary to a prerenal azotemia in the setting of diuretics.  Patient noted to be hypotensive on admission.  Diuretics on hold for now  DVT prophylaxis: SCDs Code Status: Full Family  Communication: called sister [name] [phone] Disposition:   Status is: Inpatient  Dispo: The patient is from: Home              Anticipated d/c is to: Home              Anticipated d/c date is: 48 hr              Patient currently admitted with sepsis.  Pancultured with results pending.  On IV antibiotics.  We will reassess in a.m. for potential vomiting and other issues and likely can discharge within 24-48   Consultants:   None  Procedures:  CT abdomen and pelvis 09/26/2019  Chest x-ray 09/26/2019  Antimicrobials:   IV Unasyn 09/26/2019   Subjective: More awake alert but slept poolry overnight No cp no fever asking to eat  Objective: Vitals:   09/27/19 2314 09/28/19 0303 09/28/19 0814 09/28/19 1024  BP: 121/77  133/69 129/71  Pulse:   (!) 103 (!) 106  Resp:   10 16  Temp: 99 F (37.2 C) 98.5 F (36.9 C)  99.8 F (37.7 C)  TempSrc: Oral Oral  Oral  SpO2:   92% 95%  Weight:      Height:        Intake/Output Summary (Last 24 hours) at 09/28/2019 1337 Last data filed at 09/28/2019 1000 Gross per 24 hour  Intake 1862.28 ml  Output --  Net 1862.28 ml   Filed Weights   09/26/19 1903 09/27/19 1210  Weight: 99 kg 100.4 kg    Examination:  General exam: calm and comfortable obese pleasant and slightly sleepy appearing Respiratory system: Coarse breath sounds Cardiovascular system: S1 & S2 heard, RRR Gastrointestinal system: nondistended, soft and nontender. No organomegaly or masses felt. Normal bowel sounds heard. Central nervous system: Alert and oriented. No focal neurological deficits. Extremities: Status post right BKA.  Left lower extremity with trace to 1+ edema.  Skin: No rashes, lesions or ulcers Psychiatry: Judgement and insight appear normal. Mood & affect appropriate.     Data Reviewed: I have personally reviewed following labs and imaging studies  CBC: Recent Labs  Lab 09/26/19 1906 09/28/19 0248  WBC 5.8 8.4  NEUTROABS 5.0 4.9  HGB 10.9*  8.7*  HCT 33.6* 27.1*  MCV 100.6* 101.9*  PLT 255 209    Basic Metabolic Panel: Recent Labs  Lab 09/26/19 1906 09/27/19 0433 09/28/19 0248  NA 139 138 140  K 5.6* 5.2* 4.1  CL 101 107 109  CO2 23 18* 20*  GLUCOSE 293* 299* 196*  BUN 42* 42* 31*  CREATININE 1.97* 2.04* 1.48*  CALCIUM 9.0 7.9* 8.0*  PHOS  --   --  2.6    GFR: Estimated Creatinine Clearance: 42.8 mL/min (A) (by C-G formula based on SCr of 1.48 mg/dL (H)).  Liver Function Tests: Recent Labs  Lab 09/26/19 1906 09/28/19 0248  AST 32  --   ALT 33  --   ALKPHOS 102  --   BILITOT 0.9  --   PROT 7.0  --   ALBUMIN 3.4* 2.8*    CBG: Recent Labs  Lab 09/27/19 1912 09/27/19 2315 09/28/19  0304 09/28/19 0727 09/28/19 1136  GLUCAP 303* 237* 142* 157* 233*     Recent Results (from the past 240 hour(s))  Blood Culture (routine x 2)     Status: None (Preliminary result)   Collection Time: 09/26/19  7:22 PM   Specimen: BLOOD LEFT FOREARM  Result Value Ref Range Status   Specimen Description BLOOD LEFT FOREARM  Final   Special Requests   Final    BOTTLES DRAWN AEROBIC AND ANAEROBIC Blood Culture adequate volume   Culture   Final    NO GROWTH 2 DAYS Performed at [facility], [place] [address], [place], [place] [address]    Report Status PENDING  Incomplete  Blood Culture (routine x 2)     Status: None (Preliminary result)   Collection Time: 09/26/19  7:40 PM   Specimen: BLOOD  Result Value Ref Range Status   Specimen Description BLOOD LEFT WRIST  Final   Special Requests   Final    BOTTLES DRAWN AEROBIC AND ANAEROBIC Blood Culture results may not be optimal due to an inadequate volume of blood received in culture bottles   Culture   Final    NO GROWTH 2 DAYS Performed at [facility], [place] [address], [place], [place] [address]    Report Status PENDING  Incomplete  SARS Coronavirus 2 by RT PCR (hospital order, performed in [facility] hospital lab) Nasopharyngeal Nasopharyngeal Swab      Status: None   Collection Time: 09/26/19  7:41 PM   Specimen: Nasopharyngeal Swab  Result Value Ref Range Status   SARS Coronavirus 2 NEGATIVE NEGATIVE Final    Comment: (NOTE) SARS-CoV-2 target nucleic acids are NOT DETECTED. The SARS-CoV-2 RNA is generally detectable in upper and lower respiratory specimens during the acute phase of infection. The lowest concentration of SARS-CoV-2 viral copies this assay can detect is 250 copies / mL. A negative result does not preclude SARS-CoV-2 infection and should not be used as the sole basis for treatment or other patient management decisions.  A negative result may occur with improper specimen collection / handling, submission of specimen other than nasopharyngeal swab, presence of viral mutation(s) within the areas targeted by this assay, and inadequate number of viral copies (<250 copies / mL). A negative result must be combined with clinical observations, patient history, and epidemiological information. Fact Sheet for Patients:   StrictlyIdeas.no Fact Sheet for Healthcare Providers: BankingDealers.co.za This test is not yet approved or cleared  by the [place] FDA and has been authorized for detection and/or diagnosis of SARS-CoV-2 by FDA under an Emergency Use Authorization (EUA).  This EUA will remain in effect (meaning this test can be used) for the duration of the COVID-19 declaration under Section 564(b)(1) of the Act, 21 U.S.C. section 360bbb-3(b)(1), unless the authorization is terminated or revoked sooner. Performed at [facility], [place] [address], [place], [place] [address]   Culture, Urine     Status: None   Collection Time: 09/27/19  1:07 PM   Specimen: Urine, Catheterized  Result Value Ref Range Status   Specimen Description URINE, CATHETERIZED  Final   Special Requests NONE  Final   Culture   Final    NO GROWTH Performed at [facility], [address], [place], [place] [address]    Report Status 09/28/2019 FINAL  Final         Radiology Studies: CT ABDOMEN PELVIS WO CONTRAST  Result Date: 09/26/2019 CLINICAL DATA:  Nausea,  vomiting EXAM: CT ABDOMEN AND PELVIS WITHOUT CONTRAST TECHNIQUE: Multidetector CT imaging of the abdomen and pelvis was performed following the standard protocol without IV contrast. COMPARISON:  07/03/2016 FINDINGS: Lower chest: Calcified granuloma at the left lung base. Bibasilar scarring. No acute abnormality. Hepatobiliary: Small layering gallstones within the gallbladder, stable. Diffuse fatty infiltration throughout the liver. No focal hepatic abnormality. Pancreas: No focal abnormality or ductal dilatation. Spleen: No focal abnormality.  Normal size. Adrenals/Urinary Tract: Bilateral perinephric stranding. No renal or ureteral stones. No hydronephrosis adrenal glands and urinary bladder unremarkable. Stomach/Bowel: Normal appendix. Stomach, large and small bowel grossly unremarkable. Vascular/Lymphatic: Aortic atherosclerosis. No evidence of aneurysm or adenopathy. Reproductive: Prior hysterectomy.  No adnexal masses. Other: No free fluid or free air. Musculoskeletal: No acute bony abnormality. IMPRESSION: Cholelithiasis. Diffuse fatty infiltration of the liver. Aortic atherosclerosis. No acute findings in the abdomen or pelvis. Electronically Signed   By: Rolm Baptise M.D.   On: 09/26/2019 23:26   DG Chest Port 1 View  Result Date: 09/26/2019 CLINICAL DATA:  Sepsis EXAM: PORTABLE CHEST 1 VIEW COMPARISON:  06/27/2018 FINDINGS: The heart size and mediastinal contours are within normal limits. Heterogeneous airspace opacity of the left lung base. The visualized skeletal structures are unremarkable. IMPRESSION: Heterogeneous airspace opacity of the left lung base, concerning for infection or aspiration. Electronically Signed   By: Eddie Candle M.D.   On: 09/26/2019 20:23        Scheduled Meds: . atorvastatin  20 mg  Oral QHS  . busPIRone  10 mg Oral BID  . ferrous sulfate  325 mg Oral Daily  . gabapentin  400 mg Oral TID  . hydrocortisone cream   Topical BID  . insulin aspart  0-15 Units Subcutaneous Q4H  . insulin aspart  5 Units Subcutaneous Once  . insulin detemir  20 Units Subcutaneous BID  . lamoTRIgine  100 mg Oral Daily  . metFORMIN  1,000 mg Oral BID WC  . [START ON 09/29/2019] pantoprazole  40 mg Oral Daily  . QUEtiapine  200 mg Oral QHS  . venlafaxine XR  37.5 mg Oral Daily   Continuous Infusions: . sodium chloride 100 mL/hr at 09/28/19 0335  . ampicillin-sulbactam (UNASYN) IV 3 g (09/28/19 1209)     LOS: 2 days    Time spent: 60 minutes    Nita Sells, MD [facility]   To contact the attending provider between 7A-7P or the covering provider during after hours 7P-7A, please log into the web site [email or web address] and access using universal [place] password for that web site. If you do not have the password, please call the hospital operator.  09/28/2019, 1:37 PM

## 2019-09-28 NOTE — Plan of Care (Signed)

## 2019-09-28 NOTE — Progress Notes (Signed)
  PHARMACY - PHYSICIAN COMMUNICATION CRITICAL VALUE ALERT - BLOOD CULTURE IDENTIFICATION (BCID)  Dana [name] is an 64 y.o. female who presented to [facility] on 09/26/2019 with a chief complaint of sepsis.  Assessment: One of two sets of blood cultures growing Ecoli in aerobic bottle, KPC negative. Patient is on Amp/sulb to cover aspiration pneumonia, which should cover Ecoli as well.  Name of physician (or Provider) Contacted: Dr. Verlon Au  Current antibiotics: ampicllin/sulbactam  Changes to prescribed antibiotics recommended:  Patient is on recommended antibiotics - No changes needed  Results for orders placed or performed during the hospital encounter of 02/03/16  Blood Culture ID Panel (Reflexed) (Collected: 02/03/2016  5:45 PM)  Result Value Ref Range   Enterococcus species NOT DETECTED NOT DETECTED   Listeria monocytogenes NOT DETECTED NOT DETECTED   Staphylococcus species DETECTED (A) NOT DETECTED   Staphylococcus aureus (BCID) DETECTED (A) NOT DETECTED   Methicillin resistance NOT DETECTED NOT DETECTED   Streptococcus species NOT DETECTED NOT DETECTED   Streptococcus agalactiae NOT DETECTED NOT DETECTED   Streptococcus pneumoniae NOT DETECTED NOT DETECTED   Streptococcus pyogenes NOT DETECTED NOT DETECTED   Acinetobacter baumannii NOT DETECTED NOT DETECTED   Enterobacteriaceae species NOT DETECTED NOT DETECTED   Enterobacter cloacae complex NOT DETECTED NOT DETECTED   Escherichia coli NOT DETECTED NOT DETECTED   Klebsiella oxytoca NOT DETECTED NOT DETECTED   Klebsiella pneumoniae NOT DETECTED NOT DETECTED   Proteus species NOT DETECTED NOT DETECTED   Serratia marcescens NOT DETECTED NOT DETECTED   Haemophilus influenzae NOT DETECTED NOT DETECTED   Neisseria meningitidis NOT DETECTED NOT DETECTED   Pseudomonas aeruginosa NOT DETECTED NOT DETECTED   Candida albicans NOT DETECTED NOT DETECTED   Candida glabrata NOT DETECTED NOT DETECTED   Candida krusei NOT DETECTED NOT  DETECTED   Candida parapsilosis NOT DETECTED NOT DETECTED   Candida tropicalis NOT DETECTED NOT DETECTED    Benetta Spar, PharmD, BCPS, BCCP Clinical Pharmacist  Please check AMION for all [place] phone numbers After 10:00 PM, call [place]

## 2019-09-28 NOTE — Progress Notes (Signed)
Patient Saturations on Room Air at Rest = 97%  Patient Saturations on [employer] while Ambulating = unable to wear prosthetic leg  Patient Saturations on RA Liters of oxygen while unable to Ambulate  Please briefly explain why patient needs home oxygen: Pt. exerted tremendous effort attempting to apply RLE prosthetic leg. She was unable to successfully wear it.  She maintained O2 sat. 96%.  Her O2 sat. Decreased to 89% when asleep. 3 L [place] maintained @ night to keep O2 sat. Above 92%.

## 2019-09-28 NOTE — Progress Notes (Signed)
Occupational Therapy Evaluation Patient Details Name: Dana [name] MRN: [id number] DOB: [birth year] Today's Date: 09/28/2019    History of Present Illness 64 y.o. female with medical history significant of bipolar 1 disorder, type 2 diabetes, diabetic neuropathy, R BKA, L first ray amputation, anemia, iron malabsorption, hypertension, hyperlipidemia presenting to the ED via EMS on 5/17 with AMS, vomiting. Pt with diagnosis of severe sepsis secondary to suspected aspiration PNA.   Clinical Impression   Patient lives with relatives in home and is independent at prior level.  Patient has R LE prosthesis and does not stand/transfer without it.  Today patient not wearing prosthesis as she did not have sleeve.  Eval was bed level.  Patient able to complete bed mobility with supervision and demonstrated good balance.  Patient able to complete seated ADLs with set up, LB ADLs with min assist.  Will continue to follow with OT acutely to address the deficits listed below.      Follow Up Recommendations  Home health OT;Supervision - Intermittent    Equipment Recommendations  None recommended by OT    Recommendations for Other Services       Precautions / Restrictions Precautions Precautions: Fall Restrictions Weight Bearing Restrictions: No      Mobility Bed Mobility Overal bed mobility: Needs Assistance Bed Mobility: Supine to Sit     Supine to sit: Supervision     General bed mobility comments: for safety, use of bedrails and HOB elevation to come to sitting  Transfers                 General transfer comment: did not attempt today due to lack of prosthesis    Balance Overall balance assessment: Needs assistance Sitting-balance support: No upper extremity supported;Feet supported Sitting balance-Leahy Scale: Good                                     ADL either performed or assessed with clinical judgement   ADL Overall ADL's : Needs  assistance/impaired Eating/Feeding: Independent;Sitting   Grooming: Set up;Sitting   Upper Body Bathing: Set up;Sitting   Lower Body Bathing: Sitting/lateral leans;Minimal assistance   Upper Body Dressing : Set up;Sitting   Lower Body Dressing: Minimal assistance;Sitting/lateral leans                 General ADL Comments: Patient needing new prosthesis sleeve so unable to eval standing ADLs.  Patient always uses prosthesis at home and does not transfer/stand without it on     Vision         Perception     Praxis      Pertinent Vitals/Pain Pain Assessment: Faces Faces Pain Scale: No hurt     Hand Dominance Right   Extremity/Trunk Assessment Upper Extremity Assessment Upper Extremity Assessment: Overall WFL for tasks assessed           Communication Communication Communication: No difficulties   Cognition Arousal/Alertness: Awake/alert Behavior During Therapy: WFL for tasks assessed/performed Overall Cognitive Status: Within Functional Limits for tasks assessed                                     General Comments  Patient on RA during session, SpO2 92 when sitting, 96 at laying rest    Exercises     Shoulder Instructions      Home  Living Family/patient expects to be discharged to:: Private residence Living Arrangements: Parent;Other relatives Available Help at Discharge: Family(mother and sister, both work) Type of Home: House Home Access: Ramped entrance;Stairs to enter Technical brewer of Steps: 3 Entrance Stairs-Rails: Right [place]: One level     Bathroom Shower/Tub: Occupational psychologist: [place]: Grab bars - toilet;Grab bars - tub/shower;Shower seat          Prior Functioning/Environment Level of Independence: Independent  Gait / Transfers Assistance Needed: Pt reports ambulating without assist, does not use AD and states she does not have any (per chart review pt used to  have w/c, RW)              OT Problem List: Decreased activity tolerance;Impaired balance (sitting and/or standing);Cardiopulmonary status limiting activity      OT Treatment/Interventions: Self-care/ADL training;Therapeutic exercise;Energy conservation;Therapeutic activities;Patient/family education;Balance training    OT Goals(Current goals can be found in the care plan section) Acute Rehab OT Goals Patient Stated Goal: go home OT Goal Formulation: With patient Time For Goal Achievement: 10/12/19 Potential to Achieve Goals: Good  OT Frequency: Min 2X/week   Barriers to D/C:            Co-evaluation              AM-PAC OT "6 Clicks" Daily Activity     Outcome Measure Help from another person eating meals?: None Help from another person taking care of personal grooming?: A Little Help from another person toileting, which includes using toliet, bedpan, or urinal?: A Little Help from another person bathing (including washing, rinsing, drying)?: A Little Help from another person to put on and taking off regular upper body clothing?: A Little Help from another person to put on and taking off regular lower body clothing?: A Little 6 Click Score: 19   End of Session Nurse Communication: Mobility status  Activity Tolerance: Patient tolerated treatment well Patient left: in bed;with call bell/phone within reach;with bed alarm set  OT Visit Diagnosis: Unsteadiness on feet (R26.81)                Time: 6314-9702 OT Time Calculation (min): 13 min Charges:  OT General Charges $OT Visit: 1 Visit OT Evaluation $OT Eval Moderate Complexity: 1 [address] OTR/L   Phylliss Bob 09/28/2019, 2:19 PM

## 2019-09-29 ENCOUNTER — Inpatient Hospital Stay (HOSPITAL_COMMUNITY): Payer: PPO

## 2019-09-29 ENCOUNTER — Other Ambulatory Visit: Payer: Self-pay

## 2019-09-29 LAB — GLUCOSE, CAPILLARY
Glucose-Capillary: 271 mg/dL — ABNORMAL HIGH (ref 70–99)
Glucose-Capillary: 215 mg/dL — ABNORMAL HIGH (ref 70–99)
Glucose-Capillary: 209 mg/dL — ABNORMAL HIGH (ref 70–99)
Glucose-Capillary: 292 mg/dL — ABNORMAL HIGH (ref 70–99)
Glucose-Capillary: 282 mg/dL — ABNORMAL HIGH (ref 70–99)
Glucose-Capillary: 255 mg/dL — ABNORMAL HIGH (ref 70–99)

## 2019-09-29 LAB — POCT I-STAT EG7
Acid-Base Excess: 0 mmol/L (ref 0.0–2.0)
Bicarbonate: 24.1 mmol/L (ref 20.0–28.0)
Calcium, Ion: 1.1 mmol/L — ABNORMAL LOW (ref 1.15–1.40)
HCT: 31 % — ABNORMAL LOW (ref 36.0–46.0)
Hemoglobin: 10.5 g/dL — ABNORMAL LOW (ref 12.0–15.0)
O2 Saturation: 99 %
Potassium: 5.1 mmol/L (ref 3.5–5.1)
Sodium: 139 mmol/L (ref 135–145)
TCO2: 25 mmol/L (ref 22–32)
pCO2, Ven: 37.3 mmHg — ABNORMAL LOW (ref 44.0–60.0)
pH, Ven: 7.418 (ref 7.250–7.430)
pO2, Ven: 122 mmHg — ABNORMAL HIGH (ref 32.0–45.0)

## 2019-09-29 LAB — COMPREHENSIVE METABOLIC PANEL
ALT: 47 U/L — ABNORMAL HIGH (ref 0–44)
AST: 53 U/L — ABNORMAL HIGH (ref 15–41)
Albumin: 2.8 g/dL — ABNORMAL LOW (ref 3.5–5.0)
Alkaline Phosphatase: 127 U/L — ABNORMAL HIGH (ref 38–126)
Anion gap: 7 (ref 5–15)
BUN: 20 mg/dL (ref 8–23)
CO2: 23 mmol/L (ref 22–32)
Calcium: 8.3 mg/dL — ABNORMAL LOW (ref 8.9–10.3)
Chloride: 111 mmol/L (ref 98–111)
Creatinine, Ser: 1.24 mg/dL — ABNORMAL HIGH (ref 0.44–1.00)
GFR calc Af Amer: 54 mL/min — ABNORMAL LOW (ref >60)
GFR calc non Af Amer: 47 mL/min — ABNORMAL LOW (ref >60)
Glucose, Bld: 236 mg/dL — ABNORMAL HIGH (ref 70–99)
Potassium: 4.5 mmol/L (ref 3.5–5.1)
Sodium: 141 mmol/L (ref 135–145)
Total Bilirubin: 0.7 mg/dL (ref 0.3–1.2)
Total Protein: 6.6 g/dL (ref 6.5–8.1)

## 2019-09-29 LAB — MAGNESIUM: Magnesium: 2.1 mg/dL (ref 1.7–2.4)

## 2019-09-29 MED ORDER — INSULIN DETEMIR 100 UNIT/ML ~~LOC~~ SOLN
24.0000 [IU] | Freq: Two times a day (BID) | SUBCUTANEOUS | Status: DC
  Administered 2019-09-29 – 2019-10-02 (×6): 24 [IU] via SUBCUTANEOUS
  Filled 2019-09-29 (×8): qty 0.24

## 2019-09-29 MED ORDER — METOPROLOL SUCCINATE ER 25 MG PO TB24
12.5000 mg | ORAL_TABLET | Freq: Every day | ORAL | Status: DC
  Administered 2019-09-29 – 2019-10-01 (×3): 12.5 mg via ORAL
  Filled 2019-09-29 (×3): qty 1

## 2019-09-29 MED ORDER — QUETIAPINE FUMARATE 100 MG PO TABS
100.0000 mg | ORAL_TABLET | Freq: Every day | ORAL | Status: DC
  Administered 2019-09-29 – 2019-10-01 (×3): 100 mg via ORAL
  Filled 2019-09-29 (×4): qty 1

## 2019-09-29 MED ORDER — ALBUTEROL SULFATE (2.5 MG/3ML) 0.083% IN NEBU
2.5000 mg | INHALATION_SOLUTION | Freq: Four times a day (QID) | RESPIRATORY_TRACT | Status: DC | PRN
  Filled 2019-09-29: qty 3

## 2019-09-29 MED ORDER — SODIUM CHLORIDE 0.9 % IV SOLN: INTRAVENOUS | Status: DC

## 2019-09-29 NOTE — Evaluation (Signed)
Clinical/Bedside Swallow Evaluation Patient Details  Name: Dana [name] MRN: [id number] Date of Birth: April 11, 1956  Today's Date: 09/29/2019 Time: SLP Start Time (ACUTE ONLY): 1607 SLP Stop Time (ACUTE ONLY): 0842 SLP Time Calculation (min) (ACUTE ONLY): 13 min  Past Medical History:  Past Medical History:  Diagnosis Date  . Bipolar 1 disorder ([place])   . Cellulitis and abscess of foot 03/08/2015  . Diabetes mellitus    INSULIN DEPENDENT  . Diabetic neuropathy ([place])   . 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 H/O hiatal hernia   . Hyperlipidemia   . Hypertension    past hx of  . Iron malabsorption 09/26/2015  . Numbness and tingling    Hx; of in B/LLE and B/LUE  . Other iron deficiency anemias 09/20/2015   Past Surgical History:  Past Surgical History:  Procedure Laterality Date  . ABDOMINAL HYSTERECTOMY    . ADENOIDECTOMY     Hx: of  . AMPUTATION  10/24/2011   Procedure: AMPUTATION RAY;  Surgeon: Newt Minion, MD;  Location: [place];  Service: Orthopedics;  Laterality: Right;  Right Foot 3rd Ray Amputation  . AMPUTATION Right 12/16/2012   Procedure: Right Foot Transmetatarsal Amputation;  Surgeon: Newt Minion, MD;  Location: [place];  Service: Orthopedics;  Laterality: Right;  . AMPUTATION Left 03/09/2015   Procedure: LEFT FOOT 1ST RAY AMPUTATION;  Surgeon: Newt Minion, MD;  Location: [facility];  Service: Orthopedics;  Laterality: Left;  . BLADDER SURGERY     x 2, tacked 1st time; mesh "eroded", had to be removed  . Bladder Tact   2002  . BREAST SURGERY Left    "knot removed"  . CARPAL TUNNEL RELEASE Left   . COLON SURGERY    . EYE SURGERY  11/17/2017  . NASAL SEPTUM SURGERY  1976  . TONSILLECTOMY     age 89's   HPI:  Dana [name] is a 64 y.o. female with medical history significant of bipolar 1 disorder, hiatal hernia, type 2 diabetes, diabetic neuropathy, anemia, iron malabsorption, hypertension, hyperlipidemia presenting to the ED for of altered mental status and  vomiting. Chest x-ray 5/17 showing a heterogenous airspace opacity at the left lung base concerning for infection or aspiration. BSE 5/17 while in ED revealing decreased mastication sans lower denture plate (ill fitting) and increasing globus and history of esophageal stricutre "long time ago"   Assessment / Plan / Recommendation Clinical Impression  Pt seen 5/18 for swallow assessment in addition to reassessment today. Agree with primary ST and suspect symptoms of imtermitent throat clearing may be result of globus sensation or possible esophageal residue (?). Long term history of esophageal stricture. Mildly prolonged mastication with solid. Discussed suspicions with pt and educated/reviewed esophageal strategies. Observed pill consumption with RN taking handfull with water and signficant head extension. Discussed consuming 2-3 at once if small or using puree in the future. Pt may benefit from barium esophagram while in hospital. Advised pt to contact GI doctor if esophageal symptoms worsen. Continue Dys 3 (chopped meats), thin liquids. No further ST needed.  SLP Visit Diagnosis: Dysphagia, unspecified (R13.10)    Aspiration Risk  Mild aspiration risk    Diet Recommendation Dysphagia 3 (Mech soft);Thin liquid   Liquid Administration via: Cup;Straw Medication Administration: Whole meds with liquid Supervision: Patient able to self feed Compensations: Slow rate;Small sips/bites Postural Changes: Seated upright at 90 degrees;Remain upright for at least 30 minutes after po intake    Other  Recommendations Recommended Consults: Other (Comment)(consider  esophagram) Oral Care Recommendations: Oral care BID   Follow up Recommendations None      Frequency and Duration            Prognosis        Swallow Study   General HPI: Dana [name] is a 64 y.o. female with medical history significant of bipolar 1 disorder, hiatal hernia, type 2 diabetes, diabetic neuropathy, anemia, iron  malabsorption, hypertension, hyperlipidemia presenting to the ED for of altered mental status and vomiting. Chest x-ray 5/17 showing a heterogenous airspace opacity at the left lung base concerning for infection or aspiration. BSE 5/17 while in ED revealing decreased mastication sans lower denture plate (ill fitting) and increasing globus and history of esophageal stricutre "long time ago" Type of Study: Bedside Swallow Evaluation Previous Swallow Assessment: (see HPI) Diet Prior to this Study: Dysphagia 3 (soft);Thin liquids Temperature Spikes Noted: No Respiratory Status: Nasal cannula History of Recent Intubation: No Behavior/Cognition: Cooperative;Pleasant mood;Other (Comment)(appears sleepy) Oral Cavity Assessment: Within Functional Limits Oral Care Completed by SLP: No Oral Cavity - Dentition: Edentulous;Dentures, top Vision: Functional for self-feeding Self-Feeding Abilities: Able to feed self Patient Positioning: Upright in bed Baseline Vocal Quality: Normal Volitional Cough: Strong Volitional Swallow: Able to elicit    Oral/Motor/Sensory Function Overall Oral Motor/Sensory Function: Within functional limits   Ice Chips Ice chips: Not tested   Thin Liquid Thin Liquid: Within functional limits Presentation: Cup;Straw    Nectar Thick Nectar Thick Liquid: Not tested   Honey Thick Honey Thick Liquid: Not tested   Puree Puree: Not tested   Solid     Solid: Impaired Presentation: Self Fed Oral Phase Functional Implications: Impaired mastication Pharyngeal Phase Impairments: (none)      Darcel Frane, Orbie Pyo 09/29/2019,9:10 AM  Orbie Pyo Colvin Caroli.Ed Risk analyst [phone] Office [phone]

## 2019-09-29 NOTE — Progress Notes (Signed)
PROGRESS NOTE    Dana [name]  QAE:[id number] DOB: 12-20-1955 DOA: 09/26/2019 PCP: [name], DO    Chief Complaint  Patient presents with  . Altered Mental Status  . Vomiting  . Nausea    Brief Narrative:  [age]Y female bipolar IDDM status post right BKA with nephropathy HLD iron deficiency anemia HTN admit 5/17 after several days N, V, lethargy T-max 103.0 tachycardia 130 glucose 350 On admission?  Bloody emesis  Reports aspirating food the other day when she was trying to eat.  She has no other complaints.  ED Course: Febrile with T-max 103.2 F.  Tachycardic with heart rate in the 120s.  Slightly tachypneic.  Hypotensive.  Labs showing no leukocytosis.  Lactic acid 4.0 >2.6 with IVF.  Hemoglobin 10.9 and MCV 100.6.  Hemoglobin was ranging between 11.2-11.5 on labs done in February 2021.  Potassium 5.6.  Blood glucose 293.  Bicarb 23, anion gap 15.  BUN 42, creatinine 1.9.  Baseline creatinine 1.1-1.2.  Lipase and LFTs normal.  INR 1.1.  Ammonia level normal.  SARS-CoV-2 PCR test negative.  UA and urine culture pending.  Blood culture x2 pending.  Chest x-ray personally reviewed showing a heterogenous airspace opacity at the left lung base concerning for infection or aspiration.  Patient was given Tylenol, vancomycin, cefepime, metronidazole, and 1.5 L normal saline boluses.  Assessment & Plan:   Principal Problem:   Pneumonia Active Problems:   Type 2 diabetes mellitus without complication, with long-term current use of insulin (HCC)   Severe sepsis (HCC)   Acute respiratory failure with hypoxia (HCC)   Emesis   Hyperglycemia   Sepsis (HCC)   Toxic metabolic encephalopathy   Coffee ground emesis   Hyperkalemia  1 severe sepsis secondary to aspiration pneumonia and acute organ dysfunction, POA Presented with SIRS criterion, hypotension 80s Lactic acid now down.  Chest x-ray concerning for left basilar infiltrate.   Procalcitonin elevated at 73.28.  Urine culture  negative, urine Legionella antigen,  pneumococcus antigen have not been collected empiric IV Unasyn which we will continue Continuing to have desats as below  2.  Acute hyoxic respiratory failure secondary to aspiration pneumonia Probable OHSS with OSA Patient noted to have O2 sats of high 80s on room air improved on 3 L nasal cannula.   She refuses to use OSA treatment with CPAP or BiPAP at night-she may need to discharge with oxygen as she desatted to the 80s working earlier with therapy services on 5/20 Patient had an esophagram on 5/20 which was negative and therefore I do not think that there is any need to contact gastroenterology at this juncture  3.  Dark/coffee-ground emesis Hemoglobin is stable.  No significant change since February 2021.  Lipase levels LFTs within normal limits.   CT abdomen and pelvis with cholelithiasis without acute cholecystitis.   Unlikely to have been GI bleed Diet graduated to dysphagia 3 5/19 tolerating well without bleeding  4. Sinus tachycardia Etiology unclear likely related to patient's anxiety that is underlying I will start metoprolol XL 12.5 today  5.  Acute metabolic encephalopathy On multiple medications with sedative potential such as BuSpar, gabapentin, Seroquel, Effexor Likely secondary to problem #1.  Improving clinically Have resumed at lower doses of BuSpar at 10 twice daily, resumed her gabapentin at 400 rather than 1200 3 times daily Cut back nightly Seroquel 200-->100 mg  Continue Effexor 37.5 every morning-monitor for sedation I have held her Depakote indefinitely  6.  Hyperglycemia in the  setting of insulin-dependent diabetes mellitus type 2 CBGs 209 to 215 Increase Levemir 20 twice a day-->24 units-resumed Metformin 1000 twice daily, Januvia 50 daily  7.  Mild hyperkalemia Potassium has resolved down to 4.5  7.  Macrocytic anemia/AOCD H&H currently stable.  Gastroccult test pending.  Anemia panel of chronic disease.  Follow  H&H.  8.  Acute renal failure with hyperkalemia on admission Likely secondary to a prerenal azotemia in the setting of diuretics.  Patient noted to be hypotensive on admission.  Diuretics on hold for now  DVT prophylaxis: SCDs Code Status: Full Family Communication: called sister [name] [phone] Disposition:   Status is: Inpatient  Dispo: The patient is from: Home              Anticipated d/c is to: Home              Anticipated d/c date is: 48 hr              Patient currently admitted with sepsis.  Pancultured with results pending.  On IV antibiotics.  We will reassess in a.m. for potential vomiting and other issues and likely can discharge within 24-48   Consultants:   None  Procedures:  CT abdomen and pelvis 09/26/2019  Chest x-ray 09/26/2019  Antimicrobials:   IV Unasyn 09/26/2019   Subjective: It appears that the patient had tachycardia and bradycardia as well as hypoxia overnight and was placed on 4 L of oxygen When I see her this morning she has just finished working with therapy and is walked about 25 feet but desatted to 84% She has no chest pain She has sinus tach on the monitors She tells me she was tested "a long time ago" for sleep apnea and refuses to wear CPAP-I have tried to convince her but she refuses  Objective: Vitals:   09/28/19 2035 09/29/19 0315 09/29/19 0754 09/29/19 0900  BP:    134/84  Pulse: (!) 114  100 91  Resp: (!) 21  (!) 23 (!) 21  Temp:  98.7 F (37.1 C)  98 F (36.7 C)  TempSrc:  Oral  Oral  SpO2: 98%  97% 98%  Weight:      Height:        Intake/Output Summary (Last 24 hours) at 09/29/2019 1128 Last data filed at 09/28/2019 1930 Gross per 24 hour  Intake 1306.58 ml  Output --  Net 1306.58 ml   Filed Weights   09/26/19 1903 09/27/19 1210  Weight: 99 kg 100.4 kg    Examination:  General exam: calm and comfortable Respiratory system: clear,  Cardiovascular system: S1 & S2 heard, RRR Gastrointestinal system: nondistended,  soft and nontender. No organomegaly or masses felt. Normal bowel sounds heard. Central nervous system: Alert and oriented. No focal neurological deficits. Extremities: Status post right BKA.  Left lower extremity with trace to 1+ edema.  Skin: No rashes, lesions or ulcers Psychiatry: Judgement and insight appear normal. Mood & affect appropriate.     Data Reviewed: I have personally reviewed following labs and imaging studies  CBC: Recent Labs  Lab 09/26/19 1906 09/26/19 1940 09/28/19 0248  WBC 5.8  --  8.4  NEUTROABS 5.0  --  4.9  HGB 10.9* 10.5* 8.7*  HCT 33.6* 31.0* 27.1*  MCV 100.6*  --  101.9*  PLT 255  --  706    Basic Metabolic Panel: Recent Labs  Lab 09/26/19 1906 09/26/19 1940 09/27/19 0433 09/28/19 0248 09/29/19 0755  NA 139 139 138 140  141  K 5.6* 5.1 5.2* 4.1 4.5  CL 101  --  107 109 111  CO2 23  --  18* 20* 23  GLUCOSE 293*  --  299* 196* 236*  BUN 42*  --  42* 31* 20  CREATININE 1.97*  --  2.04* 1.48* 1.24*  CALCIUM 9.0  --  7.9* 8.0* 8.3*  MG  --   --   --   --  2.1  PHOS  --   --   --  2.6  --     GFR: Estimated Creatinine Clearance: 51.1 mL/min (A) (by C-G formula based on SCr of 1.24 mg/dL (H)).  Liver Function Tests: Recent Labs  Lab 09/26/19 1906 09/28/19 0248 09/29/19 0755  AST 32  --  53*  ALT 33  --  47*  ALKPHOS 102  --  127*  BILITOT 0.9  --  0.7  PROT 7.0  --  6.6  ALBUMIN 3.4* 2.8* 2.8*    CBG: Recent Labs  Lab 09/28/19 2043 09/28/19 2339 09/29/19 0501 09/29/19 0732 09/29/19 1117  GLUCAP 293* 299* 271* 215* 209*     Recent Results (from the past 240 hour(s))  Blood Culture (routine x 2)     Status: Abnormal (Preliminary result)   Collection Time: 09/26/19  7:22 PM   Specimen: BLOOD LEFT FOREARM  Result Value Ref Range Status   Specimen Description BLOOD LEFT FOREARM  Final   Special Requests   Final    BOTTLES DRAWN AEROBIC AND ANAEROBIC Blood Culture adequate volume   Culture  Setup Time   Final    GRAM  NEGATIVE RODS AEROBIC BOTTLE ONLY CRITICAL RESULT CALLED TO, READ BACK BY AND VERIFIED WITH: L CHEN PHARMD 09/28/19 1808 JDW    Culture (A)  Final    ESCHERICHIA COLI SUSCEPTIBILITIES TO FOLLOW Performed at [facility], [place] [address], [place], [place] [address]    Report Status PENDING  Incomplete  Blood Culture ID Panel (Reflexed)     Status: Abnormal   Collection Time: 09/26/19  7:22 PM  Result Value Ref Range Status   Enterococcus species NOT DETECTED NOT DETECTED Final   Listeria monocytogenes NOT DETECTED NOT DETECTED Final   Staphylococcus species NOT DETECTED NOT DETECTED Final   Staphylococcus aureus (BCID) NOT DETECTED NOT DETECTED Final   Streptococcus species NOT DETECTED NOT DETECTED Final   Streptococcus agalactiae NOT DETECTED NOT DETECTED Final   Streptococcus pneumoniae NOT DETECTED NOT DETECTED Final   Streptococcus pyogenes NOT DETECTED NOT DETECTED Final   Acinetobacter baumannii NOT DETECTED NOT DETECTED Final   Enterobacteriaceae species DETECTED (A) NOT DETECTED Final    Comment: Enterobacteriaceae represent a large family of gram-negative bacteria, not a single organism. CRITICAL RESULT CALLED TO, READ BACK BY AND VERIFIED WITH: L CHEN PHARMD 09/28/19 1808 JDW    Enterobacter cloacae complex NOT DETECTED NOT DETECTED Final   Escherichia coli DETECTED (A) NOT DETECTED Final    Comment: CRITICAL RESULT CALLED TO, READ BACK BY AND VERIFIED WITH: L CHEN PHARMD 09/28/19 1808 JDW    Klebsiella oxytoca NOT DETECTED NOT DETECTED Final   Klebsiella pneumoniae NOT DETECTED NOT DETECTED Final   Proteus species NOT DETECTED NOT DETECTED Final   Serratia marcescens NOT DETECTED NOT DETECTED Final   Carbapenem resistance NOT DETECTED NOT DETECTED Final   Haemophilus influenzae NOT DETECTED NOT DETECTED Final   Neisseria meningitidis NOT DETECTED NOT DETECTED Final   Pseudomonas aeruginosa NOT DETECTED NOT DETECTED Final   Candida  albicans NOT DETECTED NOT  DETECTED Final   Candida glabrata NOT DETECTED NOT DETECTED Final   Candida krusei NOT DETECTED NOT DETECTED Final   Candida parapsilosis NOT DETECTED NOT DETECTED Final   Candida tropicalis NOT DETECTED NOT DETECTED Final    Comment: Performed at [facility], [place] [address], [place], [place] [address]  Blood Culture (routine x 2)     Status: None (Preliminary result)   Collection Time: 09/26/19  7:40 PM   Specimen: BLOOD  Result Value Ref Range Status   Specimen Description BLOOD LEFT WRIST  Final   Special Requests   Final    BOTTLES DRAWN AEROBIC AND ANAEROBIC Blood Culture results may not be optimal due to an inadequate volume of blood received in culture bottles   Culture   Final    NO GROWTH 3 DAYS Performed at [facility], [place] [address] [place], [place] [address]    Report Status PENDING  Incomplete  SARS Coronavirus 2 by RT PCR (hospital order, performed in [facility] hospital lab) Nasopharyngeal Nasopharyngeal Swab     Status: None   Collection Time: 09/26/19  7:41 PM   Specimen: Nasopharyngeal Swab  Result Value Ref Range Status   SARS Coronavirus 2 NEGATIVE NEGATIVE Final    Comment: (NOTE) SARS-CoV-2 target nucleic acids are NOT DETECTED. The SARS-CoV-2 RNA is generally detectable in upper and lower respiratory specimens during the acute phase of infection. The lowest concentration of SARS-CoV-2 viral copies this assay can detect is 250 copies / mL. A negative result does not preclude SARS-CoV-2 infection and should not be used as the sole basis for treatment or other patient management decisions.  A negative result may occur with improper specimen collection / handling, submission of specimen other than nasopharyngeal swab, presence of viral mutation(s) within the areas targeted by this assay, and inadequate number of viral copies (<250 copies / mL). A negative result must be combined with clinical observations, patient history, and epidemiological  information. Fact Sheet for Patients:   StrictlyIdeas.no Fact Sheet for Healthcare Providers: BankingDealers.co.za This test is not yet approved or cleared  by the [place] FDA and has been authorized for detection and/or diagnosis of SARS-CoV-2 by FDA under an Emergency Use Authorization (EUA).  This EUA will remain in effect (meaning this test can be used) for the duration of the COVID-19 declaration under Section 564(b)(1) of the Act, 21 U.S.C. section 360bbb-3(b)(1), unless the authorization is terminated or revoked sooner. Performed at [facility], [place] [address] [place], [place] [address]   Culture, Urine     Status: None   Collection Time: 09/27/19  1:07 PM   Specimen: Urine, Catheterized  Result Value Ref Range Status   Specimen Description URINE, CATHETERIZED  Final   Special Requests NONE  Final   Culture   Final    NO GROWTH Performed at [facility], [address] [place], [place] [address]    Report Status 09/28/2019 FINAL  Final     Radiology Studies: No results found.  Scheduled Meds: . atorvastatin  20 mg Oral QHS  . busPIRone  10 mg Oral BID  . ferrous sulfate  325 mg Oral Daily  . gabapentin  400 mg Oral TID  . hydrocortisone cream   Topical BID  . insulin aspart  0-15 Units Subcutaneous Q4H  . insulin aspart  5 Units Subcutaneous Once  . insulin detemir  20 Units Subcutaneous BID  . lamoTRIgine  100 mg Oral Daily  . metFORMIN  1,000 mg Oral BID WC  . pantoprazole  40 mg Oral Daily  . QUEtiapine  200 mg Oral QHS  . venlafaxine XR  37.5 mg Oral Daily   Continuous Infusions: . ampicillin-sulbactam (UNASYN) IV 3 g (09/29/19 1124)    LOS: 3 days  Time spent: 80 minutes  Nita Sells, MD [facility]   To contact the attending provider between 7A-7P or the covering provider during after hours 7P-7A, please log into the web site [email or web address] and access using universal  [place] password for that web site. If you do not have the password, please call the hospital operator.  09/29/2019, 11:28 AM

## 2019-09-29 NOTE — Progress Notes (Signed)
Physical Therapy Treatment Patient Details Name: Dana [name] MRN: [id number] DOB: 1955/07/12 Today's Date: 09/29/2019    History of Present Illness 64 y.o. female with medical history significant of bipolar 1 disorder, type 2 diabetes, diabetic neuropathy, R BKA, L first ray amputation, anemia, iron malabsorption, hypertension, hyperlipidemia presenting to the ED via EMS on 5/17 with AMS, vomiting. Pt with diagnosis of severe sepsis secondary to suspected aspiration PNA.    PT Comments    Pt making good progress towards acute PT goals, ambulating hallway distance with close guard for safety. Pt states she has been having trouble donning LE prosthesis over the past couple of days from an energy standpoint, so PT assisted. Pt required use of RW again this day due to unsteadiness in standing and for energy conservation purposes. Pt with significant dyspnea on exertion requiring assist for proper breathing technique.    Follow Up Recommendations  Home health PT;Supervision for mobility/OOB     Equipment Recommendations  Rolling walker with 5" wheels    Recommendations for Other Services       Precautions / Restrictions Precautions Precautions: Fall Restrictions Weight Bearing Restrictions: No RLE Weight Bearing: Weight bearing as tolerated Other Position/Activity Restrictions: R BKA, prosthesis in room    Mobility  Bed Mobility Overal bed mobility: Needs Assistance             General bed mobility comments: sitting EOB upon PT arrival to room.  Transfers Overall transfer level: Needs assistance Equipment used: Rolling walker (2 wheeled) Transfers: Sit to/from Stand Sit to Stand: Min guard;From elevated surface         General transfer comment: Min guard for safety, pt with self-steadying upon standing. Standing at EOB x2, first attempt for donning prosthesis.  Ambulation/Gait Ambulation/Gait assistance: Min guard Gait Distance (Feet): 50 Feet Assistive device:  Rolling walker (2 wheeled) Gait Pattern/deviations: Step-through pattern;Decreased stride length;Trunk flexed Gait velocity: decr   General Gait Details: Min guard for safety, verbal cuing for upright posture and placement in RW. Additional cuing for breathing technique with DOE 3/4 and SpO2 84% on RA requiring 2LO2 to recover, standing rest break x1   Stairs             Wheelchair Mobility    Modified Rankin (Stroke Patients Only)       Balance Overall balance assessment: Needs assistance Sitting-balance support: No upper extremity supported;Feet supported Sitting balance-Leahy Scale: Good       Standing balance-Leahy Scale: Poor Standing balance comment: reliant on external support                            Cognition Arousal/Alertness: Awake/alert Behavior During Therapy: WFL for tasks assessed/performed Overall Cognitive Status: Within Functional Limits for tasks assessed                                        Exercises      General Comments General comments (skin integrity, edema, etc.): Trialled RA during gait, with SpO2 of 84% requiring 2LO2 to recover sats >90%      Pertinent Vitals/Pain Pain Assessment: Faces Faces Pain Scale: No hurt Pain Intervention(s): Monitored during session;Limited activity within patient's tolerance    Home Living                      Prior Function  PT Goals (current goals can now be found in the care plan section) Acute Rehab PT Goals Patient Stated Goal: go home PT Goal Formulation: With patient Time For Goal Achievement: 10/11/19 Potential to Achieve Goals: Good Progress towards PT goals: Progressing toward goals    Frequency    Min 3X/week      PT Plan Current plan remains appropriate    Co-evaluation              AM-PAC PT "6 Clicks" Mobility   Outcome Measure  Help needed turning from your back to your side while in a flat bed without using  bedrails?: A Little Help needed moving from lying on your back to sitting on the side of a flat bed without using bedrails?: A Little Help needed moving to and from a bed to a chair (including a wheelchair)?: A Little Help needed standing up from a chair using your arms (e.g., wheelchair or bedside chair)?: A Little Help needed to walk in hospital room?: A Little Help needed climbing 3-5 steps with a railing? : A Lot 6 Click Score: 17    End of Session Equipment Utilized During Treatment: Gait belt;Oxygen Activity Tolerance: Patient tolerated treatment well;Patient limited by fatigue Patient left: in chair;with chair alarm set;with call bell/phone within reach Nurse Communication: Mobility status PT Visit Diagnosis: Other abnormalities of gait and mobility (R26.89);Muscle weakness (generalized) (M62.81)     Time: 7829-5621 PT Time Calculation (min) (ACUTE ONLY): 27 min  Charges:  $Gait Training: 8-22 mins $Therapeutic Activity: 8-22 mins           Yessenia Maillet E, PT Acute Rehabilitation Services Pager [phone]  Office [phone]    [place] 09/29/2019, 10:07 AM

## 2019-09-29 NOTE — Progress Notes (Signed)
SATURATION QUALIFICATIONS: (This note is used to comply with regulatory documentation for home oxygen)  Patient Saturations on Room Air at Rest = 91%  Patient Saturations on Room Air while Ambulating = 84%  Patient Saturations on 2 Liters of oxygen while Ambulating = 92%  Please briefly explain why patient needs home oxygen: to maintain appropriate SpO2 during mobility.  [place] Pager [phone]  Office [phone]

## 2019-09-29 NOTE — Plan of Care (Signed)

## 2019-09-29 NOTE — Consult Note (Signed)
[employer], not CSNP  Patient was showing as pending status for [facility] Care Management  Patient followed by [facility], changed status.  Natividad Brood, RN BSN [facility] Liaison  [phone] business mobile phone Toll free office [phone]  Fax number: [phone] [email or web address]

## 2019-09-29 NOTE — Progress Notes (Signed)
Pt stated that she feels like she cant catch her breath. Pulled up in bed, increased o2, on call provider paged, breathing treatment ordered but not given.

## 2019-09-30 DIAGNOSIS — R1310 Dysphagia, unspecified: Secondary | ICD-10-CM

## 2019-09-30 LAB — CBC WITH DIFFERENTIAL/PLATELET
Abs Immature Granulocytes: 0.11 10*3/uL — ABNORMAL HIGH (ref 0.00–0.07)
Basophils Absolute: 0.1 10*3/uL (ref 0.0–0.1)
Basophils Relative: 1 %
Eosinophils Absolute: 0.7 10*3/uL — ABNORMAL HIGH (ref 0.0–0.5)
Eosinophils Relative: 9 %
HCT: 27.6 % — ABNORMAL LOW (ref 36.0–46.0)
Hemoglobin: 9 g/dL — ABNORMAL LOW (ref 12.0–15.0)
Immature Granulocytes: 2 %
Lymphocytes Relative: 18 %
Lymphs Abs: 1.3 10*3/uL (ref 0.7–4.0)
MCH: 32.6 pg (ref 26.0–34.0)
MCHC: 32.6 g/dL (ref 30.0–36.0)
MCV: 100 fL (ref 80.0–100.0)
Monocytes Absolute: 0.9 10*3/uL (ref 0.1–1.0)
Monocytes Relative: 12 %
Neutro Abs: 4.3 10*3/uL (ref 1.7–7.7)
Neutrophils Relative %: 58 %
Platelets: 218 10*3/uL (ref 150–400)
RBC: 2.76 MIL/uL — ABNORMAL LOW (ref 3.87–5.11)
RDW: 15.1 % (ref 11.5–15.5)
WBC: 7.3 10*3/uL (ref 4.0–10.5)
nRBC: 0 % (ref 0.0–0.2)

## 2019-09-30 LAB — CULTURE, BLOOD (ROUTINE X 2): Special Requests: ADEQUATE

## 2019-09-30 LAB — GLUCOSE, CAPILLARY
Glucose-Capillary: 158 mg/dL — ABNORMAL HIGH (ref 70–99)
Glucose-Capillary: 199 mg/dL — ABNORMAL HIGH (ref 70–99)
Glucose-Capillary: 203 mg/dL — ABNORMAL HIGH (ref 70–99)
Glucose-Capillary: 189 mg/dL — ABNORMAL HIGH (ref 70–99)

## 2019-09-30 LAB — BASIC METABOLIC PANEL
Anion gap: 9 (ref 5–15)
BUN: 17 mg/dL (ref 8–23)
CO2: 22 mmol/L (ref 22–32)
Calcium: 8.7 mg/dL — ABNORMAL LOW (ref 8.9–10.3)
Chloride: 110 mmol/L (ref 98–111)
Creatinine, Ser: 1.12 mg/dL — ABNORMAL HIGH (ref 0.44–1.00)
GFR calc Af Amer: >60 mL/min (ref >60)
GFR calc non Af Amer: 53 mL/min — ABNORMAL LOW (ref >60)
Glucose, Bld: 202 mg/dL — ABNORMAL HIGH (ref 70–99)
Potassium: 4.4 mmol/L (ref 3.5–5.1)
Sodium: 141 mmol/L (ref 135–145)

## 2019-09-30 MED ORDER — INSULIN ASPART 100 UNIT/ML ~~LOC~~ SOLN
3.0000 [IU] | Freq: Three times a day (TID) | SUBCUTANEOUS | Status: DC
  Administered 2019-09-30 – 2019-10-02 (×6): 3 [IU] via SUBCUTANEOUS

## 2019-09-30 MED ORDER — CEFAZOLIN SODIUM-DEXTROSE 2-4 GM/100ML-% IV SOLN
2.0000 g | Freq: Three times a day (TID) | INTRAVENOUS | Status: DC
  Administered 2019-09-30 – 2019-10-02 (×7): 2 g via INTRAVENOUS
  Filled 2019-09-30 (×8): qty 100

## 2019-09-30 MED ORDER — BUSPIRONE HCL 15 MG PO TABS
7.5000 mg | ORAL_TABLET | Freq: Two times a day (BID) | ORAL | Status: DC
  Administered 2019-09-30 – 2019-10-02 (×4): 7.5 mg via ORAL
  Filled 2019-09-30 (×5): qty 1

## 2019-09-30 NOTE — Progress Notes (Signed)
Pt is on D4 of Unasyn for her e.coli bacteremia. Sens came back intermediate to Unasyn. All of her infection parameters are good now. Normally we would treat for 7d however, we will treat for 10d total here due to the intermediate sens. We will complete 6 more days with cefazolin after discussion with Dr. Karleen Hampshire.  Dc Unasyn  Cefazolin 2g IV q8 x 6 more days  Onnie Boer, PharmD, [employer], AAHIVP, CPP Infectious Disease Pharmacist 09/30/2019 9:11 AM

## 2019-09-30 NOTE — Progress Notes (Signed)
PROGRESS NOTE    Dana [name]  WUX:[id number] DOB: 1955-08-21 DOA: 09/26/2019 PCP: [name], DO   Chief Complaint  Patient presents with  . Altered Mental Status  . Vomiting  . Nausea    Brief Narrative:   64 year old lady prior history of  diabetes mellitus, iron deficiency anemia, essential hypertension, s/p right BKA, hyperlipidemia presents to ED on 09/26/2019 for shortness of breath, and several days of nausea vomiting and lethargy.  On arrival she was found to be febrile, hypoxic, hypotensive, in AKI with elevated lactic acid and elevated procalcitonin.  She was admitted for severe sepsis secondary to left basilar pneumonia/aspiration pneumonia, AKI.  Subsequently her blood cultures revealed E. coli.  Patient was started on broad-spectrum IV antibiotics later transitioned to IV Ancef to complete the course.  Assessment & Plan:   Principal Problem:   Pneumonia Active Problems:   Type 2 diabetes mellitus without complication, with long-term current use of insulin (HCC)   Severe sepsis (HCC)   Acute respiratory failure with hypoxia (HCC)   Emesis   Hyperglycemia   Sepsis (HCC)   Toxic metabolic encephalopathy   Coffee ground emesis   Hyperkalemia     Sepsis secondary to aspiration pneumonia/community-acquired pneumonia, organ dysfunction present on admission Patient was hypotensive with elevated lactic Acid and AKI on admission. Chest x-ray showed left basilar infiltrate. Continue with Ancef.   E. coli bacteremia IV antibiotics transition to IV Ancef to complete the course.  99.1, leukocytosis has resolved.   Mildly elevated liver enzymes Patient denies any nausea and vomiting at this time.    Anemia of chronic disease/microcytic anemia Hemoglobin stable around 9 and no obvious signs of bleeding. Anemia panel reviewed, normal iron levels and ferritin, folate and vitamin B12.  Continue with iron supplementation.    Bipolar disorder Continue with  BuSpar, Seroquel and Lamictal.    Diabetic neuropathy Continue with gabapentin 400 mg twice daily.    Hyperlipidemia continue with the Lipitor.    Acute respiratory failure with hypoxia probably secondary to left basilar pneumonia Continue with IV antibiotics and wean her off the oxygen in the next 24 to 48 hours as tolerated.    Type 2 diabetes mellitus uncontrolled with hyperglycemia CBG (last 3)  Recent Labs    09/29/19 1949 09/29/19 2229 09/30/19 0352  GLUCAP 282* 255* 158*   Levemir 24 units twice daily and add NovoLog 3 units 3 times daily AC.,  Continue with sliding scale insulin.    Essential hypertension Blood pressure parameters are  Sub optimal, increase metoprolol to 25 mg daily.    Coffee-ground emesis Probably secondary to sepsis from left-sided pneumonia.   Hyperkalemia and AKI Hyperkalemia resolved. AKI probably secondary to severe sepsis, hypotension.  Patient was admitted with a creatinine of 1.9 and creatinine has improved to 1.2 at this time. DC IV fluids and monitor creatinine in the morning.    DVT prophylaxis: SCDs  code Status: Full code Family Communication: None at bedside Disposition:   Status is: Inpatient  Remains inpatient appropriate because:IV treatments appropriate due to intensity of illness or inability to take PO   Dispo: The patient is from: Home              Anticipated d/c is to: Home              Anticipated d/c date is: 2 days              Patient currently is not medically stable to d/c.  Consultants:   None   Procedures: None  Antimicrobials:  Anti-infectives (From admission, onward)   Start     Dose/Rate Route Frequency Ordered Stop   09/30/19 1000  ceFAZolin (ANCEF) IVPB 2g/100 mL premix     2 g 200 mL/hr over 30 Minutes Intravenous Every 8 hours 09/30/19 0905 10/06/19 0959   09/28/19 2200  vancomycin (VANCOREADY) IVPB 1250 mg/250 mL  Status:  Discontinued     1,250 mg 166.7 mL/hr over  90 Minutes Intravenous Every 48 hours 09/26/19 2047 09/26/19 2235   09/27/19 0800  ceFEPIme (MAXIPIME) 2 g in sodium chloride 0.9 % 100 mL IVPB  Status:  Discontinued     2 g 200 mL/hr over 30 Minutes Intravenous Every 12 hours 09/26/19 2047 09/26/19 2235   09/27/19 0600  Ampicillin-Sulbactam (UNASYN) 3 g in sodium chloride 0.9 % 100 mL IVPB  Status:  Discontinued     3 g 200 mL/hr over 30 Minutes Intravenous Every 6 hours 09/26/19 2250 09/30/19 0905   09/26/19 1930  ceFEPIme (MAXIPIME) 2 g in sodium chloride 0.9 % 100 mL IVPB     2 g 200 mL/hr over 30 Minutes Intravenous  Once 09/26/19 1921 09/26/19 2031   09/26/19 1930  vancomycin (VANCOREADY) IVPB 2000 mg/400 mL     2,000 mg 200 mL/hr over 120 Minutes Intravenous  Once 09/26/19 1928 09/27/19 0013   09/26/19 1915  aztreonam (AZACTAM) 2 g in sodium chloride 0.9 % 100 mL IVPB  Status:  Discontinued     2 g 200 mL/hr over 30 Minutes Intravenous  Once 09/26/19 1907 09/26/19 1921   09/26/19 1915  metroNIDAZOLE (FLAGYL) IVPB 500 mg     500 mg 100 mL/hr over 60 Minutes Intravenous  Once 09/26/19 1907 09/26/19 2143   09/26/19 1915  vancomycin (VANCOCIN) IVPB 1000 mg/200 mL premix  Status:  Discontinued     1,000 mg 200 mL/hr over 60 Minutes Intravenous  Once 09/26/19 1907 09/26/19 1928       Subjective: No new complaints.   Objective: Vitals:   09/29/19 1527 09/29/19 1910 09/29/19 2235 09/30/19 0354  BP: (!) 156/80 (!) 165/97 (!) 159/84 (!) 151/81  Pulse:  (!) 102 97 90  Resp:  18 (!) 31 (!) 21  Temp: 99.1 F (37.3 C) 98.7 F (37.1 C) 98.5 F (36.9 C) 98.5 F (36.9 C)  TempSrc: Oral Oral Oral Oral  SpO2:  97% 96% 98%  Weight:      Height:       No intake or output data in the 24 hours ending 09/30/19 1303 Filed Weights   09/26/19 1903 09/27/19 1210  Weight: 99 kg 100.4 kg    Examination:  General exam: Appears calm and comfortable on nasal cannula oxygen  respiratory system:  Scattered rhonchi on the left, tachypnea  present.  Cardiovascular system: S1 & S2 heard, RRR. No JVD, . Gastrointestinal system: Abdomen is nondistended, soft and nontender.  Normal bowel sounds heard. Central nervous system: sleeping upright in the chair, able to answer to answer questions on verbal commands.  Extremities: Right BKA, left pedal edema Skin: No rashes, lesions or ulcers Psychiatry:  Mood & affect appropriate.     Data Reviewed: I have personally reviewed following labs and imaging studies  CBC: Recent Labs  Lab 09/26/19 1906 09/26/19 1940 09/28/19 0248 09/30/19 0309  WBC 5.8  --  8.4 7.3  NEUTROABS 5.0  --  4.9 4.3  HGB 10.9* 10.5* 8.7* 9.0*  HCT 33.6* 31.0* 27.1* 27.6*  MCV 100.6*  --  101.9* 100.0  PLT 255  --  189 637    Basic Metabolic Panel: Recent Labs  Lab 09/26/19 1906 09/26/19 1906 09/26/19 1940 09/27/19 0433 09/28/19 0248 09/29/19 0755 09/30/19 0309  NA 139   < > 139 138 140 141 141  K 5.6*   < > 5.1 5.2* 4.1 4.5 4.4  CL 101  --   --  107 109 111 110  CO2 23  --   --  18* 20* 23 22  GLUCOSE 293*  --   --  299* 196* 236* 202*  BUN 42*  --   --  42* 31* 20 17  CREATININE 1.97*  --   --  2.04* 1.48* 1.24* 1.12*  CALCIUM 9.0  --   --  7.9* 8.0* 8.3* 8.7*  MG  --   --   --   --   --  2.1  --   PHOS  --   --   --   --  2.6  --   --    < > = values in this interval not displayed.    GFR: Estimated Creatinine Clearance: 56.6 mL/min (A) (by C-G formula based on SCr of 1.12 mg/dL (H)).  Liver Function Tests: Recent Labs  Lab 09/26/19 1906 09/28/19 0248 09/29/19 0755  AST 32  --  53*  ALT 33  --  47*  ALKPHOS 102  --  127*  BILITOT 0.9  --  0.7  PROT 7.0  --  6.6  ALBUMIN 3.4* 2.8* 2.8*    CBG: Recent Labs  Lab 09/29/19 1117 09/29/19 1525 09/29/19 1949 09/29/19 2229 09/30/19 0352  GLUCAP 209* 292* 282* 255* 158*     Recent Results (from the past 240 hour(s))  Blood Culture (routine x 2)     Status: Abnormal   Collection Time: 09/26/19  7:22 PM   Specimen: BLOOD  LEFT FOREARM  Result Value Ref Range Status   Specimen Description BLOOD LEFT FOREARM  Final   Special Requests   Final    BOTTLES DRAWN AEROBIC AND ANAEROBIC Blood Culture adequate volume   Culture  Setup Time   Final    GRAM NEGATIVE RODS AEROBIC BOTTLE ONLY CRITICAL RESULT CALLED TO, READ BACK BY AND VERIFIED WITH: Starlyn Skeans [facility] 09/28/19 1808 JDW Performed at [facility], [place] [address] [place], [place] [address]    Culture ESCHERICHIA COLI (A)  Final   Report Status 09/30/2019 FINAL  Final   Organism ID, Bacteria ESCHERICHIA COLI  Final      Susceptibility   Escherichia coli - MIC*    AMPICILLIN >=32 RESISTANT Resistant     CEFAZOLIN <=4 SENSITIVE Sensitive     CEFEPIME <=1 SENSITIVE Sensitive     CEFTAZIDIME <=1 SENSITIVE Sensitive     CEFTRIAXONE <=1 SENSITIVE Sensitive     CIPROFLOXACIN <=0.25 SENSITIVE Sensitive     GENTAMICIN <=1 SENSITIVE Sensitive     IMIPENEM <=0.25 SENSITIVE Sensitive     TRIMETH/SULFA <=20 SENSITIVE Sensitive     AMPICILLIN/SULBACTAM 16 INTERMEDIATE Intermediate     PIP/TAZO <=4 SENSITIVE Sensitive     * ESCHERICHIA COLI  Blood Culture ID Panel (Reflexed)     Status: Abnormal   Collection Time: 09/26/19  7:22 PM  Result Value Ref Range Status   Enterococcus species NOT DETECTED NOT DETECTED Final   Listeria monocytogenes NOT DETECTED NOT DETECTED Final   Staphylococcus species NOT DETECTED NOT DETECTED Final   Staphylococcus  aureus (BCID) NOT DETECTED NOT DETECTED Final   Streptococcus species NOT DETECTED NOT DETECTED Final   Streptococcus agalactiae NOT DETECTED NOT DETECTED Final   Streptococcus pneumoniae NOT DETECTED NOT DETECTED Final   Streptococcus pyogenes NOT DETECTED NOT DETECTED Final   Acinetobacter baumannii NOT DETECTED NOT DETECTED Final   Enterobacteriaceae species DETECTED (A) NOT DETECTED Final    Comment: Enterobacteriaceae represent a large family of gram-negative bacteria, not a single organism. CRITICAL RESULT  CALLED TO, READ BACK BY AND VERIFIED WITH: L CHEN PHARMD 09/28/19 1808 JDW    Enterobacter cloacae complex NOT DETECTED NOT DETECTED Final   Escherichia coli DETECTED (A) NOT DETECTED Final    Comment: CRITICAL RESULT CALLED TO, READ BACK BY AND VERIFIED WITH: L CHEN PHARMD 09/28/19 1808 JDW    Klebsiella oxytoca NOT DETECTED NOT DETECTED Final   Klebsiella pneumoniae NOT DETECTED NOT DETECTED Final   Proteus species NOT DETECTED NOT DETECTED Final   Serratia marcescens NOT DETECTED NOT DETECTED Final   Carbapenem resistance NOT DETECTED NOT DETECTED Final   Haemophilus influenzae NOT DETECTED NOT DETECTED Final   Neisseria meningitidis NOT DETECTED NOT DETECTED Final   Pseudomonas aeruginosa NOT DETECTED NOT DETECTED Final   Candida albicans NOT DETECTED NOT DETECTED Final   Candida glabrata NOT DETECTED NOT DETECTED Final   Candida krusei NOT DETECTED NOT DETECTED Final   Candida parapsilosis NOT DETECTED NOT DETECTED Final   Candida tropicalis NOT DETECTED NOT DETECTED Final    Comment: Performed at [facility], [place]. [address], [place], [place] [address]  Blood Culture (routine x 2)     Status: None (Preliminary result)   Collection Time: 09/26/19  7:40 PM   Specimen: BLOOD  Result Value Ref Range Status   Specimen Description BLOOD LEFT WRIST  Final   Special Requests   Final    BOTTLES DRAWN AEROBIC AND ANAEROBIC Blood Culture results may not be optimal due to an inadequate volume of blood received in culture bottles   Culture   Final    NO GROWTH 4 DAYS Performed at [facility], [place] [address] [place], [place] [address]    Report Status PENDING  Incomplete  SARS Coronavirus 2 by RT PCR (hospital order, performed in [facility] hospital lab) Nasopharyngeal Nasopharyngeal Swab     Status: None   Collection Time: 09/26/19  7:41 PM   Specimen: Nasopharyngeal Swab  Result Value Ref Range Status   SARS Coronavirus 2 NEGATIVE NEGATIVE Final    Comment:  (NOTE) SARS-CoV-2 target nucleic acids are NOT DETECTED. The SARS-CoV-2 RNA is generally detectable in upper and lower respiratory specimens during the acute phase of infection. The lowest concentration of SARS-CoV-2 viral copies this assay can detect is 250 copies / mL. A negative result does not preclude SARS-CoV-2 infection and should not be used as the sole basis for treatment or other patient management decisions.  A negative result may occur with improper specimen collection / handling, submission of specimen other than nasopharyngeal swab, presence of viral mutation(s) within the areas targeted by this assay, and inadequate number of viral copies (<250 copies / mL). A negative result must be combined with clinical observations, patient history, and epidemiological information. Fact Sheet for Patients:   StrictlyIdeas.no Fact Sheet for Healthcare Providers: BankingDealers.co.za This test is not yet approved or cleared  by the [place] FDA and has been authorized for detection and/or diagnosis of SARS-CoV-2 by FDA under an Emergency Use Authorization (EUA).  This  EUA will remain in effect (meaning this test can be used) for the duration of the COVID-19 declaration under Section 564(b)(1) of the Act, 21 U.S.C. section 360bbb-3(b)(1), unless the authorization is terminated or revoked sooner. Performed at [facility], [place] [address] [place], [place] [address]   Culture, Urine     Status: None   Collection Time: 09/27/19  1:07 PM   Specimen: Urine, Catheterized  Result Value Ref Range Status   Specimen Description URINE, CATHETERIZED  Final   Special Requests NONE  Final   Culture   Final    NO GROWTH Performed at [facility], [address], [place], [place] [address]    Report Status 09/28/2019 FINAL  Final         Radiology Studies: DG ESOPHAGUS W SINGLE CM (SOL OR THIN BA)  Result Date:  09/29/2019 CLINICAL DATA:  Throat clearing sensation.  Dysphagia EXAM: ESOPHOGRAM/BARIUM SWALLOW TECHNIQUE: Single contrast examination was performed using  thin barium. FLUOROSCOPY TIME:  Fluoroscopy Time:  1 minutes 36 seconds Radiation Exposure Index (if provided by the fluoroscopic device): Number of Acquired Spot Images: 0 COMPARISON:  None. FINDINGS: Fluoroscopic evaluation of swallowing demonstrates normal esophageal peristalsis. No fixed stricture, fold thickening or mass. No reflux with the water siphon maneuver. IMPRESSION: Unremarkable study. Electronically Signed   By: Rolm Baptise M.D.   On: 09/29/2019 12:56        Scheduled Meds: . atorvastatin  20 mg Oral QHS  . busPIRone  10 mg Oral BID  . ferrous sulfate  325 mg Oral Daily  . gabapentin  400 mg Oral TID  . hydrocortisone cream   Topical BID  . insulin aspart  0-15 Units Subcutaneous Q4H  . insulin aspart  5 Units Subcutaneous Once  . insulin detemir  24 Units Subcutaneous BID  . lamoTRIgine  100 mg Oral Daily  . metoprolol succinate  12.5 mg Oral Daily  . pantoprazole  40 mg Oral Daily  . QUEtiapine  100 mg Oral QHS  . venlafaxine XR  37.5 mg Oral Daily   Continuous Infusions: .  ceFAZolin (ANCEF) IV 2 g (09/30/19 1033)     LOS: 4 days        Hosie Poisson, MD [facility]   To contact the attending provider between 7A-7P or the covering provider during after hours 7P-7A, please log into the web site [email or web address] and access using universal [place] password for that web site. If you do not have the password, please call the hospital operator.  09/30/2019, 1:03 PM

## 2019-09-30 NOTE — Progress Notes (Signed)
Occupational Therapy Treatment Patient Details Name: Dana [name] MRN: [id number] DOB: 1955/12/06 Today's Date: 09/30/2019    History of present illness 64 y.o. female with medical history significant of bipolar 1 disorder, type 2 diabetes, diabetic neuropathy, R BKA, L first ray amputation, anemia, iron malabsorption, hypertension, hyperlipidemia presenting to the ED via EMS on 5/17 with AMS, vomiting. Pt with diagnosis of severe sepsis secondary to suspected aspiration PNA.   OT comments  Pt. Seen for skilled OT treatment session.  Pt. Seated in chair upon arrival.  Able to review and provide examples of fall prevention strategies at home.  Return demo of b ue exercises as part of her HEP.  Encouraged completion multiple times a day to progress activity tolerance and strength.    Follow Up Recommendations  Home health OT;Supervision - Intermittent    Equipment Recommendations  None recommended by OT    Recommendations for Other Services      Precautions / Restrictions Precautions Precautions: Fall Restrictions RLE Weight Bearing: Weight bearing as tolerated Other Position/Activity Restrictions: R BKA, prosthesis in room       Mobility Bed Mobility                  Transfers                      Balance                                           ADL either performed or assessed with clinical judgement   ADL                                         General ADL Comments: pt. able  recall the purpose of and describe examples of fall prevention strategies. states her home is set up to allow for clear walk and pathways     Vision       Perception     Praxis      Cognition Arousal/Alertness: Lethargic Behavior During Therapy: WFL for tasks assessed/performed Overall Cognitive Status: Within Functional Limits for tasks assessed                                          Exercises General Exercises  - Upper Extremity Shoulder Flexion: AROM;Both;Seated;10 reps Shoulder Extension: AROM;Both;Seated;10 reps Elbow Flexion: AROM;Both;10 reps;Seated Elbow Extension: AROM;Both;10 reps;Seated Chair Push Up: Other (comment)(educated on tech. and provided demo. encouraged pt. to attempt with just clearing buttocks off of the chair as able.) Other Exercises Other Exercises: encouraged and reviewed benefits of completing b ue exercises 3 times a day   Shoulder Instructions       General Comments      Pertinent Vitals/ Pain       Pain Assessment: No/denies pain  Home Living                                          Prior Functioning/Environment              Frequency  Min 2X/week  Progress Toward Goals  OT Goals(current goals can now be found in the care plan section)  Progress towards OT goals: Progressing toward goals     Plan      Co-evaluation                 AM-PAC OT "6 Clicks" Daily Activity     Outcome Measure   Help from another person eating meals?: None Help from another person taking care of personal grooming?: A Little Help from another person toileting, which includes using toliet, bedpan, or urinal?: A Little Help from another person bathing (including washing, rinsing, drying)?: A Little Help from another person to put on and taking off regular upper body clothing?: A Little Help from another person to put on and taking off regular lower body clothing?: A Little 6 Click Score: 19    End of Session    OT Visit Diagnosis: Unsteadiness on feet (R26.81)   Activity Tolerance Patient tolerated treatment well   Patient Left with call bell/phone within reach;in chair   Nurse Communication          Time: 2229-7989 OT Time Calculation (min): 11 min  Charges: OT General Charges $OT Visit: 1 Visit OT Treatments $Therapeutic Exercise: 8-22 mins  Dana [name], COTA/L Acute Rehabilitation [phone]   Dana [name] 09/30/2019, 9:44 AM

## 2019-09-30 NOTE — Progress Notes (Signed)
Pt refused MRSA swab-state it's been 10 years. Pt made aware of outside hx less than 5 years tested. Pt made aware that hospital staff may wear PPE equipment in room d/t not being able to swab and confirm results per standard progressive protocol. Pt made aware of the usage of equipment.

## 2019-10-01 LAB — BASIC METABOLIC PANEL
Anion gap: 8 (ref 5–15)
BUN: 20 mg/dL (ref 8–23)
CO2: 24 mmol/L (ref 22–32)
Calcium: 8.9 mg/dL (ref 8.9–10.3)
Chloride: 108 mmol/L (ref 98–111)
Creatinine, Ser: 1.12 mg/dL — ABNORMAL HIGH (ref 0.44–1.00)
GFR calc Af Amer: >60 mL/min (ref >60)
GFR calc non Af Amer: 53 mL/min — ABNORMAL LOW (ref >60)
Glucose, Bld: 120 mg/dL — ABNORMAL HIGH (ref 70–99)
Potassium: 4.3 mmol/L (ref 3.5–5.1)
Sodium: 140 mmol/L (ref 135–145)

## 2019-10-01 LAB — CBC
HCT: 27.8 % — ABNORMAL LOW (ref 36.0–46.0)
Hemoglobin: 9 g/dL — ABNORMAL LOW (ref 12.0–15.0)
MCH: 32.3 pg (ref 26.0–34.0)
MCHC: 32.4 g/dL (ref 30.0–36.0)
MCV: 99.6 fL (ref 80.0–100.0)
Platelets: 245 10*3/uL (ref 150–400)
RBC: 2.79 MIL/uL — ABNORMAL LOW (ref 3.87–5.11)
RDW: 14.8 % (ref 11.5–15.5)
WBC: 8.4 10*3/uL (ref 4.0–10.5)
nRBC: 0.2 % (ref 0.0–0.2)

## 2019-10-01 LAB — GLUCOSE, CAPILLARY
Glucose-Capillary: 111 mg/dL — ABNORMAL HIGH (ref 70–99)
Glucose-Capillary: 119 mg/dL — ABNORMAL HIGH (ref 70–99)
Glucose-Capillary: 156 mg/dL — ABNORMAL HIGH (ref 70–99)
Glucose-Capillary: 178 mg/dL — ABNORMAL HIGH (ref 70–99)
Glucose-Capillary: 200 mg/dL — ABNORMAL HIGH (ref 70–99)
Glucose-Capillary: 219 mg/dL — ABNORMAL HIGH (ref 70–99)
Glucose-Capillary: 236 mg/dL — ABNORMAL HIGH (ref 70–99)

## 2019-10-01 LAB — CULTURE, BLOOD (ROUTINE X 2): Culture: NO GROWTH

## 2019-10-01 MED ORDER — HYDRALAZINE HCL 10 MG PO TABS
10.0000 mg | ORAL_TABLET | Freq: Three times a day (TID) | ORAL | Status: DC
  Administered 2019-10-01 – 2019-10-02 (×4): 10 mg via ORAL
  Filled 2019-10-01 (×4): qty 1

## 2019-10-01 MED ORDER — METOPROLOL SUCCINATE ER 25 MG PO TB24
25.0000 mg | ORAL_TABLET | Freq: Every day | ORAL | Status: DC
  Administered 2019-10-02: 25 mg via ORAL
  Filled 2019-10-01: qty 1

## 2019-10-01 NOTE — Progress Notes (Signed)
Pt rested well t/o the night with no distress noted.

## 2019-10-01 NOTE — Progress Notes (Signed)
PROGRESS NOTE    Dana [name]  ZJQ:[id number] DOB: 1956-02-04 DOA: 09/26/2019 PCP: [name], DO   Chief Complaint  Patient presents with  . Altered Mental Status  . Vomiting  . Nausea    Brief Narrative:   64 year old lady prior history of  diabetes mellitus, iron deficiency anemia, essential hypertension, s/p right BKA, hyperlipidemia presents to ED on 09/26/2019 for shortness of breath, and several days of nausea vomiting and lethargy.  On arrival she was found to be febrile, hypoxic, hypotensive, in AKI with elevated lactic acid and elevated procalcitonin.  She was admitted for severe sepsis secondary to left basilar pneumonia/aspiration pneumonia, AKI.  Subsequently her blood cultures revealed E. coli.  Patient was started on broad-spectrum IV antibiotics later transitioned to IV Ancef to complete the course. Pt seen and examined at bedside, reports her breathing has improved.   Assessment & Plan:   Principal Problem:   Pneumonia Active Problems:   Type 2 diabetes mellitus without complication, with long-term current use of insulin (HCC)   Severe sepsis (HCC)   Acute respiratory failure with hypoxia (HCC)   Emesis   Hyperglycemia   Sepsis (HCC)   Toxic metabolic encephalopathy   Coffee ground emesis   Hyperkalemia     Sepsis secondary to aspiration pneumonia/community-acquired pneumonia, organ dysfunction present on admission Patient was hypotensive on admission with elevated lactic Acid and AKI on admission. Chest x-ray showed left basilar infiltrate. Continue with Ancef, transition to Vantin on discharge. Patient is currently requiring up to 2 L of nasal cannula oxygen to keep sats greater than 90%.  Wean her off the oxygen in the next 24 hours as tolerated.   E. coli bacteremia IV antibiotics transition to IV Ancef to complete the course.   Repeat blood cultures ordered and have been negative so far.  Patient remains afebrile and WBC count within normal  limits.   Mildly elevated liver enzymes Patient denies any nausea and vomiting at this time.    Anemia of chronic disease/microcytic anemia Hemoglobin stable around 9 and no obvious signs of bleeding. Anemia panel reviewed, normal iron levels and ferritin, folate and vitamin B12.  Continue with iron supplementation.    Bipolar disorder Continue with BuSpar, Seroquel and Lamictal.    Diabetic neuropathy Continue with gabapentin 400 mg twice daily.    Hyperlipidemia continue with the Lipitor.    Acute respiratory failure with hypoxia probably secondary to left basilar pneumonia Continue with IV antibiotics and wean her off the oxygen in the next 24 to 48 hours as tolerated. Currently requiring up to 2 L of nasal cannula oxygen to keep sats greater than 90%.   Type 2 diabetes mellitus uncontrolled with hyperglycemia CBG (last 3)  Recent Labs    10/01/19 0414 10/01/19 0759 10/01/19 1131  GLUCAP 111* 119* 178*   Levemir 24 units twice daily and add NovoLog 3 units 3 times daily AC.,  Continue with sliding scale insulin. CBGs are well controlled today.   Essential hypertension Not well controlled, hydralazine added to the regimen.  Continue with metoprolol.    Coffee-ground emesis Probably secondary to sepsis from left-sided pneumonia.   Hyperkalemia and AKI Hyperkalemia resolved. AKI probably secondary to severe sepsis, hypotension.  Patient was admitted with a creatinine of 1.9 and creatinine has improved to 1.2 at this time.  Creatinine stable around 1.1.     DVT prophylaxis: SCDs  code Status: Full code Family Communication: None at bedside Disposition:   Status is: Inpatient  Remains  inpatient appropriate because:IV treatments appropriate due to intensity of illness or inability to take PO   Dispo: The patient is from: Home              Anticipated d/c is to: Home              Anticipated d/c date is: 1 day              Patient currently is  not medically stable to d/c.        Consultants:   None   Procedures: None  Antimicrobials:  Anti-infectives (From admission, onward)   Start     Dose/Rate Route Frequency Ordered Stop   09/30/19 1000  ceFAZolin (ANCEF) IVPB 2g/100 mL premix     2 g 200 mL/hr over 30 Minutes Intravenous Every 8 hours 09/30/19 0905 10/06/19 0959   09/28/19 2200  vancomycin (VANCOREADY) IVPB 1250 mg/250 mL  Status:  Discontinued     1,250 mg 166.7 mL/hr over 90 Minutes Intravenous Every 48 hours 09/26/19 2047 09/26/19 2235   09/27/19 0800  ceFEPIme (MAXIPIME) 2 g in sodium chloride 0.9 % 100 mL IVPB  Status:  Discontinued     2 g 200 mL/hr over 30 Minutes Intravenous Every 12 hours 09/26/19 2047 09/26/19 2235   09/27/19 0600  Ampicillin-Sulbactam (UNASYN) 3 g in sodium chloride 0.9 % 100 mL IVPB  Status:  Discontinued     3 g 200 mL/hr over 30 Minutes Intravenous Every 6 hours 09/26/19 2250 09/30/19 0905   09/26/19 1930  ceFEPIme (MAXIPIME) 2 g in sodium chloride 0.9 % 100 mL IVPB     2 g 200 mL/hr over 30 Minutes Intravenous  Once 09/26/19 1921 09/26/19 2031   09/26/19 1930  vancomycin (VANCOREADY) IVPB 2000 mg/400 mL     2,000 mg 200 mL/hr over 120 Minutes Intravenous  Once 09/26/19 1928 09/27/19 0013   09/26/19 1915  aztreonam (AZACTAM) 2 g in sodium chloride 0.9 % 100 mL IVPB  Status:  Discontinued     2 g 200 mL/hr over 30 Minutes Intravenous  Once 09/26/19 1907 09/26/19 1921   09/26/19 1915  metroNIDAZOLE (FLAGYL) IVPB 500 mg     500 mg 100 mL/hr over 60 Minutes Intravenous  Once 09/26/19 1907 09/26/19 2143   09/26/19 1915  vancomycin (VANCOCIN) IVPB 1000 mg/200 mL premix  Status:  Discontinued     1,000 mg 200 mL/hr over 60 Minutes Intravenous  Once 09/26/19 1907 09/26/19 1928       Subjective: No chest pain or shortness of breath, nausea or vomiting or abdominal pain  Objective: Vitals:   09/30/19 2244 10/01/19 0416 10/01/19 0800 10/01/19 1137  BP: (!) 160/84 (!) 162/94 (!)  156/70 (!) 161/82  Pulse: 89 91 87 87  Resp: (!) 26 (!) 21 18 (!) 23  Temp: 98.8 F (37.1 C) 98.2 F (36.8 C) 98.2 F (36.8 C) 98.2 F (36.8 C)  TempSrc: Oral Oral Oral Oral  SpO2: 98% 98% 97% 97%  Weight:      Height:        Intake/Output Summary (Last 24 hours) at 10/01/2019 1424 Last data filed at 09/30/2019 2300 Gross per 24 hour  Intake 1107.52 ml  Output 850 ml  Net 257.52 ml   Filed Weights   09/26/19 1903 09/27/19 1210  Weight: 99 kg 100.4 kg    Examination:  General exam: Alert and sitting in the chair on 2 L of nasal cannula oxygen.  respiratory system: Diminished air  entry throughout the lungs, some tachypnea present no wheezing heard Cardiovascular system: S1-S2 heard, regular rate rhythm, no JVD,  Gastrointestinal system: Abdomen is soft, nontender, nondistended, bowel sounds normal Central nervous system: Alert and oriented and answering all questions appropriately. Extremities: Right BKA, left pedal edema present. Skin: No rashes seen Psychiatry: Mood is appropriate    Data Reviewed: I have personally reviewed following labs and imaging studies  CBC: Recent Labs  Lab 09/26/19 1906 09/26/19 1940 09/28/19 0248 09/30/19 0309 10/01/19 0528  WBC 5.8  --  8.4 7.3 8.4  NEUTROABS 5.0  --  4.9 4.3  --   HGB 10.9* 10.5* 8.7* 9.0* 9.0*  HCT 33.6* 31.0* 27.1* 27.6* 27.8*  MCV 100.6*  --  101.9* 100.0 99.6  PLT 255  --  189 218 824    Basic Metabolic Panel: Recent Labs  Lab 09/27/19 0433 09/28/19 0248 09/29/19 0755 09/30/19 0309 10/01/19 0528  NA 138 140 141 141 140  K 5.2* 4.1 4.5 4.4 4.3  CL 107 109 111 110 108  CO2 18* 20* 23 22 24   GLUCOSE 299* 196* 236* 202* 120*  BUN 42* 31* 20 17 20   CREATININE 2.04* 1.48* 1.24* 1.12* 1.12*  CALCIUM 7.9* 8.0* 8.3* 8.7* 8.9  MG  --   --  2.1  --   --   PHOS  --  2.6  --   --   --     GFR: Estimated Creatinine Clearance: 56.6 mL/min (A) (by C-G formula based on SCr of 1.12 mg/dL (H)).  Liver  Function Tests: Recent Labs  Lab 09/26/19 1906 09/28/19 0248 09/29/19 0755  AST 32  --  53*  ALT 33  --  47*  ALKPHOS 102  --  127*  BILITOT 0.9  --  0.7  PROT 7.0  --  6.6  ALBUMIN 3.4* 2.8* 2.8*    CBG: Recent Labs  Lab 09/30/19 2032 09/30/19 2357 10/01/19 0414 10/01/19 0759 10/01/19 1131  GLUCAP 189* 156* 111* 119* 178*     Recent Results (from the past 240 hour(s))  Blood Culture (routine x 2)     Status: Abnormal   Collection Time: 09/26/19  7:22 PM   Specimen: BLOOD LEFT FOREARM  Result Value Ref Range Status   Specimen Description BLOOD LEFT FOREARM  Final   Special Requests   Final    BOTTLES DRAWN AEROBIC AND ANAEROBIC Blood Culture adequate volume   Culture  Setup Time   Final    GRAM NEGATIVE RODS AEROBIC BOTTLE ONLY CRITICAL RESULT CALLED TO, READ BACK BY AND VERIFIED WITH: Starlyn Skeans [facility] 09/28/19 1808 JDW Performed at [facility], [place] [address], [place], [place] [address]    Culture ESCHERICHIA COLI (A)  Final   Report Status 09/30/2019 FINAL  Final   Organism ID, Bacteria ESCHERICHIA COLI  Final      Susceptibility   Escherichia coli - MIC*    AMPICILLIN >=32 RESISTANT Resistant     CEFAZOLIN <=4 SENSITIVE Sensitive     CEFEPIME <=1 SENSITIVE Sensitive     CEFTAZIDIME <=1 SENSITIVE Sensitive     CEFTRIAXONE <=1 SENSITIVE Sensitive     CIPROFLOXACIN <=0.25 SENSITIVE Sensitive     GENTAMICIN <=1 SENSITIVE Sensitive     IMIPENEM <=0.25 SENSITIVE Sensitive     TRIMETH/SULFA <=20 SENSITIVE Sensitive     AMPICILLIN/SULBACTAM 16 INTERMEDIATE Intermediate     PIP/TAZO <=4 SENSITIVE Sensitive     * ESCHERICHIA COLI  Blood Culture ID Panel (Reflexed)  Status: Abnormal   Collection Time: 09/26/19  7:22 PM  Result Value Ref Range Status   Enterococcus species NOT DETECTED NOT DETECTED Final   Listeria monocytogenes NOT DETECTED NOT DETECTED Final   Staphylococcus species NOT DETECTED NOT DETECTED Final   Staphylococcus aureus (BCID) NOT  DETECTED NOT DETECTED Final   Streptococcus species NOT DETECTED NOT DETECTED Final   Streptococcus agalactiae NOT DETECTED NOT DETECTED Final   Streptococcus pneumoniae NOT DETECTED NOT DETECTED Final   Streptococcus pyogenes NOT DETECTED NOT DETECTED Final   Acinetobacter baumannii NOT DETECTED NOT DETECTED Final   Enterobacteriaceae species DETECTED (A) NOT DETECTED Final    Comment: Enterobacteriaceae represent a large family of gram-negative bacteria, not a single organism. CRITICAL RESULT CALLED TO, READ BACK BY AND VERIFIED WITH: L CHEN PHARMD 09/28/19 1808 JDW    Enterobacter cloacae complex NOT DETECTED NOT DETECTED Final   Escherichia coli DETECTED (A) NOT DETECTED Final    Comment: CRITICAL RESULT CALLED TO, READ BACK BY AND VERIFIED WITH: L CHEN PHARMD 09/28/19 1808 JDW    Klebsiella oxytoca NOT DETECTED NOT DETECTED Final   Klebsiella pneumoniae NOT DETECTED NOT DETECTED Final   Proteus species NOT DETECTED NOT DETECTED Final   Serratia marcescens NOT DETECTED NOT DETECTED Final   Carbapenem resistance NOT DETECTED NOT DETECTED Final   Haemophilus influenzae NOT DETECTED NOT DETECTED Final   Neisseria meningitidis NOT DETECTED NOT DETECTED Final   Pseudomonas aeruginosa NOT DETECTED NOT DETECTED Final   Candida albicans NOT DETECTED NOT DETECTED Final   Candida glabrata NOT DETECTED NOT DETECTED Final   Candida krusei NOT DETECTED NOT DETECTED Final   Candida parapsilosis NOT DETECTED NOT DETECTED Final   Candida tropicalis NOT DETECTED NOT DETECTED Final    Comment: Performed at [facility], [place]. [address] [place], [place] [address]  Blood Culture (routine x 2)     Status: None   Collection Time: 09/26/19  7:40 PM   Specimen: BLOOD  Result Value Ref Range Status   Specimen Description BLOOD LEFT WRIST  Final   Special Requests   Final    BOTTLES DRAWN AEROBIC AND ANAEROBIC Blood Culture results may not be optimal due to an inadequate volume of blood received in  culture bottles   Culture   Final    NO GROWTH 5 DAYS Performed at [facility], [place] [address] [place],  [address]    Report Status 10/01/2019 FINAL  Final  SARS Coronavirus 2 by RT PCR (hospital order, performed in [facility] hospital lab) Nasopharyngeal Nasopharyngeal Swab     Status: None   Collection Time: 09/26/19  7:41 PM   Specimen: Nasopharyngeal Swab  Result Value Ref Range Status   SARS Coronavirus 2 NEGATIVE NEGATIVE Final    Comment: (NOTE) SARS-CoV-2 target nucleic acids are NOT DETECTED. The SARS-CoV-2 RNA is generally detectable in upper and lower respiratory specimens during the acute phase of infection. The lowest concentration of SARS-CoV-2 viral copies this assay can detect is 250 copies / mL. A negative result does not preclude SARS-CoV-2 infection and should not be used as the sole basis for treatment or other patient management decisions.  A negative result may occur with improper specimen collection / handling, submission of specimen other than nasopharyngeal swab, presence of viral mutation(s) within the areas targeted by this assay, and inadequate number of viral copies (<250 copies / mL). A negative result must be combined with clinical observations, patient history, and epidemiological information. Fact Sheet  for Patients:   StrictlyIdeas.no Fact Sheet for Healthcare Providers: BankingDealers.co.za This test is not yet approved or cleared  by the [place] FDA and has been authorized for detection and/or diagnosis of SARS-CoV-2 by FDA under an Emergency Use Authorization (EUA).  This EUA will remain in effect (meaning this test can be used) for the duration of the COVID-19 declaration under Section 564(b)(1) of the Act, 21 U.S.C. section 360bbb-3(b)(1), unless the authorization is terminated or revoked sooner. Performed at [facility], [place] [address], [place], [place] [address]    Culture, Urine     Status: None   Collection Time: 09/27/19  1:07 PM   Specimen: Urine, Catheterized  Result Value Ref Range Status   Specimen Description URINE, CATHETERIZED  Final   Special Requests NONE  Final   Culture   Final    NO GROWTH Performed at [facility], [address], [place], [place] [address]    Report Status 09/28/2019 FINAL  Final         Radiology Studies: No results found.      Scheduled Meds: . atorvastatin  20 mg Oral QHS  . busPIRone  7.5 mg Oral BID  . ferrous sulfate  325 mg Oral Daily  . gabapentin  400 mg Oral TID  . hydrocortisone cream   Topical BID  . insulin aspart  0-15 Units Subcutaneous Q4H  . insulin aspart  3 Units Subcutaneous TID WC  . insulin aspart  5 Units Subcutaneous Once  . insulin detemir  24 Units Subcutaneous BID  . lamoTRIgine  100 mg Oral Daily  . metoprolol succinate  12.5 mg Oral Daily  . pantoprazole  40 mg Oral Daily  . QUEtiapine  100 mg Oral QHS   Continuous Infusions: .  ceFAZolin (ANCEF) IV 2 g (10/01/19 1044)     LOS: 5 days        Hosie Poisson, MD [facility]   To contact the attending provider between 7A-7P or the covering provider during after hours 7P-7A, please log into the web site [email or web address] and access using universal [place] password for that web site. If you do not have the password, please call the hospital operator.  10/01/2019, 2:24 PM

## 2019-10-01 NOTE — Progress Notes (Signed)
This nurse was ambulating patient to check 02 while ambulating without oxygen. Pt. prosthesis from her right leg "unclicked" as she described it and she started to stumble. Unable to stop her fall, this nurse lowered her to the floor. Charge nurse and Dr. Karleen Hampshire was notified. Patient did not hit her head, nor did she sustain any other injuries. Vitals were taken and all were WNL. Pt. stated she was not in any pain nor did her or her body appear to be in distress. Pt. was assisted back to bed, cleaned then transferred by to the recliner where she prefers to sit/rest. Pt is alert and oriented and dreamed to safety sit in recliner with chair alarm. This nurse, nurse tech and other members of this team will continue to monitor and report any change of condition.

## 2019-10-02 ENCOUNTER — Inpatient Hospital Stay (HOSPITAL_COMMUNITY): Payer: PPO

## 2019-10-02 LAB — GLUCOSE, CAPILLARY
Glucose-Capillary: 180 mg/dL — ABNORMAL HIGH (ref 70–99)
Glucose-Capillary: 146 mg/dL — ABNORMAL HIGH (ref 70–99)
Glucose-Capillary: 193 mg/dL — ABNORMAL HIGH (ref 70–99)

## 2019-10-02 MED ORDER — BUSPIRONE HCL 7.5 MG PO TABS: 7.5000 mg | ORAL_TABLET | Freq: Two times a day (BID) | ORAL | 0 refills | Status: DC

## 2019-10-02 MED ORDER — QUETIAPINE FUMARATE 100 MG PO TABS: 100.0000 mg | ORAL_TABLET | Freq: Every day | ORAL | 0 refills | Status: DC

## 2019-10-02 MED ORDER — CEFPODOXIME PROXETIL 200 MG PO TABS: 300.0000 mg | ORAL_TABLET | Freq: Two times a day (BID) | ORAL | 0 refills | Status: AC

## 2019-10-02 MED ORDER — GUAIFENESIN 100 MG/5ML PO SOLN: 5.0000 mL | Freq: Four times a day (QID) | ORAL | 0 refills | Status: DC | PRN

## 2019-10-02 MED ORDER — GABAPENTIN 400 MG PO CAPS: 400.0000 mg | ORAL_CAPSULE | Freq: Three times a day (TID) | ORAL | 0 refills | Status: DC

## 2019-10-02 MED ORDER — METOPROLOL SUCCINATE ER 25 MG PO TB24: 25.0000 mg | ORAL_TABLET | Freq: Every day | ORAL | 1 refills | Status: DC

## 2019-10-02 NOTE — Progress Notes (Signed)
SATURATION QUALIFICATIONS: (This note is used to comply with regulatory documentation for home oxygen)  Patient Saturations on Room Air at Rest = 87%  Patient Saturations on Room Air while Ambulating = 86%  Patient Saturations on 2 Liters of oxygen while Ambulating = 92%  Please briefly explain why patient needs home oxygen: Pt is not able to sustain safe oxygen levels with out the aid of o2 .

## 2019-10-02 NOTE — TOC Transition Note (Signed)
Transition of Care [facility]) - CM/SW Discharge Note   Patient Details  Name: Dana [name] MRN: [id number] Date of Birth: [birth year]  [facility] CM/SW Contact:  Carles Collet, RN Phone Number: 10/02/2019, 2:30 PM   Clinical Narrative:    HOme oxygen ordered through [employer]. Transport tank will be delivered to room prior to DC. [place] services arranged through [facility].    Final next level of care: [place]     Patient Goals and CMS Choice Patient states their goals for this hospitalization and ongoing recovery are:: to go home CMS Medicare.gov Compare Post Acute Care list provided to:: Patient Choice offered to / list presented to : Patient  Discharge Placement                       Discharge Plan and Services                DME Arranged: Oxygen DME Agency: [employer] Date DME Agency Contacted: 10/02/19 Time DME Agency Contacted: [phone] Representative spoke with at DME Agency: [place]: PT, OT [place] Agency: [facility] [place] Date [place]: 10/02/19 Time [place]: 1429 Representative spoke with at [place]: [place] ([place]) Interventions     Readmission Risk Interventions No flowsheet data found.

## 2019-10-02 NOTE — Progress Notes (Signed)
Pt received her discharged instructions and verbally discussed that she understood. Pt belongings were returned to her from pharmacy. Pt was not appear to be in any distress. Pt. Stated that she was ready to return home with family. Pt sister picked her up in from of hospital.

## 2019-10-03 NOTE — Discharge Summary (Addendum)
Physician Discharge Summary  Dana [name] NOM:[id number] DOB: 04/15/1956 DOA: 09/26/2019  PCP: Dana Morning, DO  Admit date: 09/26/2019 Discharge date: 10/02/2019  Admitted From: Home.  Disposition: Home with Home health and oxygen   Recommendations for Outpatient Follow-up:  1. Follow up with PCP in 1-2 weeks 2. Please obtain BMP/CBC in one week   Discharge Condition:stable.  CODE STATUS: full code.  Diet recommendation: Heart Healthy   Brief/Interim Summary: 64 year old lady prior history of  diabetes mellitus, iron deficiency anemia, essential hypertension, s/p right BKA, hyperlipidemia presents to ED on 09/26/2019 for shortness of breath, and several days of nausea vomiting and lethargy.  On arrival she was found to be febrile, hypoxic, hypotensive, in AKI with elevated lactic acid and elevated procalcitonin.  She was admitted for severe sepsis secondary to left basilar pneumonia/aspiration pneumonia, AKI.  Subsequently her blood cultures revealed E. coli.  Patient was started on broad-spectrum IV antibiotics later transitioned to IV Ancef to complete the course. IV Antibiotics were transitioned to oral meds on discharge to complete the course.   Discharge Diagnoses:  Principal Problem:   Pneumonia Active Problems:   Type 2 diabetes mellitus without complication, with long-term current use of insulin (HCC)   Severe sepsis (HCC)   Acute respiratory failure with hypoxia (HCC)   Emesis   Hyperglycemia   Sepsis (HCC)   Toxic metabolic encephalopathy   Coffee ground emesis   Hyperkalemia  Sepsis secondary to aspiration pneumonia/community-acquired pneumonia, organ dysfunction present on admission Patient was hypotensive on admission with elevated lactic Acid and AKI on admission. Chest x-ray showed left basilar infiltrate. Continue with Ancef, transitioned to [place] on discharge. Patient is currently requiring up to 2 L of nasal cannula oxygen to keep sats greater than 90%.  Wean  her off the oxygen in the next 24 hours as tolerated.   E. coli bacteremia IV antibiotics transition to IV Ancef to complete the course.   Repeat blood cultures ordered and have been negative so far.  Patient remains afebrile and WBC count within normal limits.   Mildly elevated liver enzymes Patient denies any nausea and vomiting at this time.    Anemia of chronic disease/microcytic anemia Hemoglobin stable around 9 and no obvious signs of bleeding. Anemia panel reviewed, normal iron levels and ferritin, folate and vitamin B12.  Continue with iron supplementation.    Bipolar disorder Continue with BuSpar, Seroquel and Lamictal.    Diabetic neuropathy Continue with gabapentin 400 mg twice daily.    Hyperlipidemia continue with the Lipitor.    Acute respiratory failure with hypoxia probably secondary to left basilar pneumonia Continue with IV antibiotics and wean her off the oxygen in the next 24 to 48 hours as tolerated. Currently requiring up to 2 L of nasal cannula oxygen to keep sats greater than 90%.   Type 2 diabetes mellitus uncontrolled with hyperglycemia CBG (last 3)  Recent Labs (last 2 labs)        Recent Labs    10/01/19 0414 10/01/19 0759 10/01/19 1131  GLUCAP 111* 119* 178*     Resume home regimen on discharge.   Essential hypertension Better controlled this am.   Coffee-ground emesis Probably secondary to sepsis from left-sided pneumonia.   Hyperkalemia and AKI Hyperkalemia resolved. AKI probably secondary to severe sepsis, hypotension.  Patient was admitted with a creatinine of 1.9 and creatinine has improved to 1.1       Discharge Instructions  Discharge Instructions    Diet - low sodium heart  healthy   Complete by: As directed    Discharge instructions   Complete by: As directed    Please follow up with PCP in one week.     Allergies as of 10/02/2019      Reactions   Keflex [cephalexin]     Tremors, rash, hard to breathe   Lithium Nausea And Vomiting, Other (See Comments)   Other reaction(s): Other (See Comments) Can not keep this medication down. It makes her terribly ill. Can not keep this medication down. It makes her terribly ill.   Ativan [lorazepam] Other (See Comments)   Other reaction(s): ANAPHYLAXIS Other reaction(s): Other (See Comments) Abnormal behavior   Demerol [meperidine] Other (See Comments)   Abnormal behavior (sees things) Other reaction(s): ANAPHYLAXIS Other reaction(s): Other (See Comments)   Oxycodone Other (See Comments)   Other reaction(s): OTHER Abnormal behavior   Codeine Itching, Other (See Comments)   hallucinations   Doxycycline Other (See Comments)   Severe muscle tremor   Darvocet [propoxyphene N-acetaminophen] Itching   Latex Itching   Other reaction(s): OTHER   Propoxyphene Itching      Medication List    STOP taking these medications   divalproex 125 MG DR tablet Commonly known as: DEPAKOTE   fluticasone 50 MCG/ACT nasal spray Commonly known as: FLONASE   gabapentin 600 MG tablet Commonly known as: NEURONTIN Replaced by: gabapentin 400 MG capsule   venlafaxine XR 37.5 MG 24 hr capsule Commonly known as: EFFEXOR-XR     TAKE these medications   APOAEQUORIN PO Take 1 tablet by mouth daily.   aspirin 81 MG EC tablet Take 81 mg by mouth daily. Swallow whole.   atorvastatin 20 MG tablet Commonly known as: LIPITOR Take 1 tablet (20 mg total) by mouth at bedtime.   BD Veo Insulin Syringe U/F 31G X 15/64" 1 ML Misc Generic drug: Insulin Syringe-Needle U-100 Administer insulin up 3 times per day per sliding scale E.11.9   busPIRone 7.5 MG tablet Commonly known as: BUSPAR Take 1 tablet (7.5 mg total) by mouth 2 (two) times daily. What changed:   medication strength  how much to take  how to take this  when to take this  additional instructions   cefpodoxime 200 MG tablet Commonly known as: VANTIN Take 1.5  tablets (300 mg total) by mouth 2 (two) times daily for 4 days.   CENTRUM ADULTS PO Take 1 tablet by mouth daily.   freestyle lancets Use as instructed to check blood sugar 3 times per day dx code E11.39   FreeStyle Libre 14 Day Sensor Misc SMARTSIG:1 Each Topical Every 2 Weeks   furosemide 20 MG tablet Commonly known as: LASIX TAKE 1 TABLET BY MOUTH TWICE DAILY What changed:   how much to take  when to take this   gabapentin 400 MG capsule Commonly known as: NEURONTIN Take 1 capsule (400 mg total) by mouth 3 (three) times daily. Replaces: gabapentin 600 MG tablet   glucose blood test strip Commonly known as: FREESTYLE LITE USE AS INSTRUCTED TO CHECK BLOOD SUGAR 3 TIMES DAILY. DX:E11.65   guaiFENesin 100 MG/5ML Soln Commonly known as: ROBITUSSIN Take 5 mLs (100 mg total) by mouth every 6 (six) hours as needed for cough or to loosen phlegm.   HumuLIN R U-500 KwikPen 500 UNIT/ML kwikpen Generic drug: insulin regular human CONCENTRATED Inject 45 Units into the skin 3 (three) times daily with meals. What changed: Another medication with the same name was removed. Continue taking this medication, and follow  the directions you see here.   Iron 325 (65 Fe) MG Tabs Take 325 mg by mouth daily.   Januvia 50 MG tablet Generic drug: sitaGLIPtin Take 50 mg by mouth daily.   lamoTRIgine 100 MG tablet Commonly known as: LAMICTAL Take 100 mg by mouth daily.   metFORMIN 500 MG 24 hr tablet Commonly known as: GLUCOPHAGE-XR TAKE 2 TABLETS IN THE [name] AND 2 TABLETS AT NIGHT. What changed:   how much to take  how to take this  when to take this  additional instructions   metoprolol succinate 25 MG 24 hr tablet Commonly known as: TOPROL-XL Take 1 tablet (25 mg total) by mouth daily.   NovoLIN N ReliOn 100 UNIT/ML injection Generic drug: insulin NPH Human INJECT 60 UNITS AM and 50 units PM SUBCUTANEOUSLY TWICE DAILY FOR 30 DAYS What changed:   how much to  take  how to take this  when to take this  additional instructions   pantoprazole 40 MG tablet Commonly known as: PROTONIX Take 1 tablet (40 mg total) by mouth daily.   QUEtiapine 100 MG tablet Commonly known as: SEROQUEL Take 1 tablet (100 mg total) by mouth at bedtime. What changed:   how much to take  how to take this  when to take this  additional instructions   Skin Tac Adhesive Barrier Wipe Misc 1 each by Does not apply route daily.      [place] Follow up.   Specialty: [place] Why: They will contact you in 1-2 days to set up your first appointment Contact information: [place] [address] [phone]          Allergies  Allergen Reactions  . Keflex [Cephalexin]     Tremors, rash, hard to breathe  . Lithium Nausea And Vomiting and Other (See Comments)    Other reaction(s): Other (See Comments) Can not keep this medication down. It makes her terribly ill. Can not keep this medication down. It makes her terribly ill.  . Ativan [Lorazepam] Other (See Comments)    Other reaction(s): ANAPHYLAXIS Other reaction(s): Other (See Comments) Abnormal behavior   . Demerol [Meperidine] Other (See Comments)    Abnormal behavior (sees things) Other reaction(s): ANAPHYLAXIS Other reaction(s): Other (See Comments)   . Oxycodone Other (See Comments)    Other reaction(s): OTHER Abnormal behavior  . Codeine Itching and Other (See Comments)    hallucinations  . Doxycycline Other (See Comments)    Severe muscle tremor  . Darvocet [Propoxyphene N-Acetaminophen] Itching  . Latex Itching    Other reaction(s): OTHER  . Propoxyphene Itching    Consultations:  None.    Procedures/Studies: CT ABDOMEN PELVIS WO CONTRAST  Result Date: 09/26/2019 CLINICAL DATA:  Nausea, vomiting EXAM: CT ABDOMEN AND PELVIS WITHOUT CONTRAST TECHNIQUE: Multidetector CT imaging of the  abdomen and pelvis was performed following the standard protocol without IV contrast. COMPARISON:  07/03/2016 FINDINGS: Lower chest: Calcified granuloma at the left lung base. Bibasilar scarring. No acute abnormality. Hepatobiliary: Small layering gallstones within the gallbladder, stable. Diffuse fatty infiltration throughout the liver. No focal hepatic abnormality. Pancreas: No focal abnormality or ductal dilatation. Spleen: No focal abnormality.  Normal size. Adrenals/Urinary Tract: Bilateral perinephric stranding. No renal or ureteral stones. No hydronephrosis adrenal glands and urinary bladder unremarkable. Stomach/Bowel: Normal appendix. Stomach, large and small bowel grossly unremarkable. Vascular/Lymphatic: Aortic atherosclerosis. No evidence of aneurysm or adenopathy. Reproductive: Prior hysterectomy.  No adnexal masses. Other: No free fluid or free air. Musculoskeletal: No acute bony abnormality. IMPRESSION: Cholelithiasis. Diffuse fatty infiltration of the liver. Aortic atherosclerosis. No acute findings in the abdomen or pelvis. Electronically Signed   By: Rolm Baptise M.D.   On: 09/26/2019 23:26   DG Chest 2 View  Result Date: 10/02/2019 CLINICAL DATA:  Nausea and vomiting EXAM: CHEST - 2 VIEW COMPARISON:  09/26/2019 FINDINGS: Cardiac shadows within normal limits. Aortic calcifications are seen. Vascular congestion with small effusions is noted. Mild bibasilar atelectasis is seen as well. IMPRESSION: Changes of mild CHF. Mild bibasilar atelectasis. Electronically Signed   By: Inez Catalina M.D.   On: 10/02/2019 13:13   DG Chest Port 1 View  Result Date: 09/26/2019 CLINICAL DATA:  Sepsis EXAM: PORTABLE CHEST 1 VIEW COMPARISON:  06/27/2018 FINDINGS: The heart size and mediastinal contours are within normal limits. Heterogeneous airspace opacity of the left lung base. The visualized skeletal structures are unremarkable. IMPRESSION: Heterogeneous airspace opacity of the left lung base, concerning for  infection or aspiration. Electronically Signed   By: Eddie Candle M.D.   On: 09/26/2019 20:23   DG ESOPHAGUS W SINGLE CM (SOL OR THIN BA)  Result Date: 09/29/2019 CLINICAL DATA:  Throat clearing sensation.  Dysphagia EXAM: ESOPHOGRAM/BARIUM SWALLOW TECHNIQUE: Single contrast examination was performed using  thin barium. FLUOROSCOPY TIME:  Fluoroscopy Time:  1 minutes 36 seconds Radiation Exposure Index (if provided by the fluoroscopic device): Number of Acquired Spot Images: 0 COMPARISON:  None. FINDINGS: Fluoroscopic evaluation of swallowing demonstrates normal esophageal peristalsis. No fixed stricture, fold thickening or mass. No reflux with the water siphon maneuver. IMPRESSION: Unremarkable study. Electronically Signed   By: Rolm Baptise M.D.   On: 09/29/2019 12:56       Subjective: No chest pain or sob.   Discharge Exam: Vitals:   10/02/19 0753 10/02/19 1139  BP: (!) 165/75 (!) 131/91  Pulse: 88 80  Resp: 16 14  Temp: 97.7 F (36.5 C) 98.5 F (36.9 C)  SpO2: 98% 99%   Vitals:   10/01/19 2330 10/02/19 0339 10/02/19 0753 10/02/19 1139  BP: (!) 152/71 140/64 (!) 165/75 (!) 131/91  Pulse: 84 88 88 80  Resp: 19 (!) 21 16 14   Temp: 98.6 F (37 C) 98.5 F (36.9 C) 97.7 F (36.5 C) 98.5 F (36.9 C)  TempSrc: Oral Oral Oral Oral  SpO2: 99% 99% 98% 99%  Weight:      Height:        General: Pt is alert, awake, not in acute distress Cardiovascular: RRR, S1/S2 +, no rubs, no gallops Respiratory: CTA bilaterally, no wheezing, no rhonchi Abdominal: Soft, NT, ND, bowel sounds + Extremities: no edema, no cyanosis    The results of significant diagnostics from this hospitalization (including imaging, microbiology, ancillary and laboratory) are listed below for reference.     Microbiology: Recent Results (from the past 240 hour(s))  Blood Culture (routine x 2)     Status: Abnormal   Collection Time: 09/26/19  7:22 PM   Specimen: BLOOD LEFT FOREARM  Result Value Ref Range  Status   Specimen Description BLOOD LEFT FOREARM  Final   Special Requests   Final    BOTTLES DRAWN AEROBIC AND ANAEROBIC Blood Culture adequate volume   Culture  Setup Time   Final    GRAM NEGATIVE RODS AEROBIC BOTTLE ONLY CRITICAL RESULT CALLED TO, READ BACK BY AND VERIFIED WITH: Starlyn [facility]ans [facility] 09/28/19 1808 JDW Performed at [facility],  [address], [place], [place] [address]    Culture ESCHERICHIA COLI (A)  Final   Report Status 09/30/2019 FINAL  Final   Organism ID, Bacteria ESCHERICHIA COLI  Final      Susceptibility   Escherichia coli - MIC*    AMPICILLIN >=32 RESISTANT Resistant     CEFAZOLIN <=4 SENSITIVE Sensitive     CEFEPIME <=1 SENSITIVE Sensitive     CEFTAZIDIME <=1 SENSITIVE Sensitive     CEFTRIAXONE <=1 SENSITIVE Sensitive     CIPROFLOXACIN <=0.25 SENSITIVE Sensitive     GENTAMICIN <=1 SENSITIVE Sensitive     IMIPENEM <=0.25 SENSITIVE Sensitive     TRIMETH/SULFA <=20 SENSITIVE Sensitive     AMPICILLIN/SULBACTAM 16 INTERMEDIATE Intermediate     PIP/TAZO <=4 SENSITIVE Sensitive     * ESCHERICHIA COLI  Blood Culture ID Panel (Reflexed)     Status: Abnormal   Collection Time: 09/26/19  7:22 PM  Result Value Ref Range Status   Enterococcus species NOT DETECTED NOT DETECTED Final   Listeria monocytogenes NOT DETECTED NOT DETECTED Final   Staphylococcus species NOT DETECTED NOT DETECTED Final   Staphylococcus aureus (BCID) NOT DETECTED NOT DETECTED Final   Streptococcus species NOT DETECTED NOT DETECTED Final   Streptococcus agalactiae NOT DETECTED NOT DETECTED Final   Streptococcus pneumoniae NOT DETECTED NOT DETECTED Final   Streptococcus pyogenes NOT DETECTED NOT DETECTED Final   Acinetobacter baumannii NOT DETECTED NOT DETECTED Final   Enterobacteriaceae species DETECTED (A) NOT DETECTED Final    Comment: Enterobacteriaceae represent a large family of gram-negative bacteria, not a single organism. CRITICAL RESULT CALLED TO, READ BACK BY AND VERIFIED  WITH: L CHEN PHARMD 09/28/19 1808 JDW    Enterobacter cloacae complex NOT DETECTED NOT DETECTED Final   Escherichia coli DETECTED (A) NOT DETECTED Final    Comment: CRITICAL RESULT CALLED TO, READ BACK BY AND VERIFIED WITH: L CHEN PHARMD 09/28/19 1808 JDW    Klebsiella oxytoca NOT DETECTED NOT DETECTED Final   Klebsiella pneumoniae NOT DETECTED NOT DETECTED Final   Proteus species NOT DETECTED NOT DETECTED Final   Serratia marcescens NOT DETECTED NOT DETECTED Final   Carbapenem resistance NOT DETECTED NOT DETECTED Final   Haemophilus influenzae NOT DETECTED NOT DETECTED Final   Neisseria meningitidis NOT DETECTED NOT DETECTED Final   Pseudomonas aeruginosa NOT DETECTED NOT DETECTED Final   Candida albicans NOT DETECTED NOT DETECTED Final   Candida glabrata NOT DETECTED NOT DETECTED Final   Candida krusei NOT DETECTED NOT DETECTED Final   Candida parapsilosis NOT DETECTED NOT DETECTED Final   Candida tropicalis NOT DETECTED NOT DETECTED Final    Comment: Performed at [facility], [place]. [address] [place], [place] [address]  Blood Culture (routine x 2)     Status: None   Collection Time: 09/26/19  7:40 PM   Specimen: BLOOD  Result Value Ref Range Status   Specimen Description BLOOD LEFT WRIST  Final   Special Requests   Final    BOTTLES DRAWN AEROBIC AND ANAEROBIC Blood Culture results may not be optimal due to an inadequate volume of blood received in culture bottles   Culture   Final    NO GROWTH 5 DAYS Performed at [facility], [place] [address] [place], [place] [address]    Report Status 10/01/2019 FINAL  Final  SARS Coronavirus 2 by RT PCR (hospital order, performed in [facility] hospital lab) Nasopharyngeal Nasopharyngeal Swab     Status: None   Collection Time: 09/26/19  7:41 PM   Specimen: Nasopharyngeal Swab  Result Value Ref Range Status   SARS Coronavirus 2 NEGATIVE NEGATIVE Final    Comment: (NOTE) SARS-CoV-2 target nucleic acids are NOT DETECTED. The  SARS-CoV-2 RNA is generally detectable in upper and lower respiratory specimens during the acute phase of infection. The lowest concentration of SARS-CoV-2 viral copies this assay can detect is 250 copies / mL. A negative result does not preclude SARS-CoV-2 infection and should not be used as the sole basis for treatment or other patient management decisions.  A negative result may occur with improper specimen collection / handling, submission of specimen other than nasopharyngeal swab, presence of viral mutation(s) within the areas targeted by this assay, and inadequate number of viral copies (<250 copies / mL). A negative result must be combined with clinical observations, patient history, and epidemiological information. Fact Sheet for Patients:   StrictlyIdeas.no Fact Sheet for Healthcare Providers: BankingDealers.co.za This test is not yet approved or cleared  by the [place] FDA and has been authorized for detection and/or diagnosis of SARS-CoV-2 by FDA under an Emergency Use Authorization (EUA).  This EUA will remain in effect (meaning this test can be used) for the duration of the COVID-19 declaration under Section 564(b)(1) of the Act, 21 U.S.C. section 360bbb-3(b)(1), unless the authorization is terminated or revoked sooner. Performed at [facility], [place] [address] [place], [place] [address]   Culture, Urine     Status: None   Collection Time: 09/27/19  1:07 PM   Specimen: Urine, Catheterized  Result Value Ref Range Status   Specimen Description URINE, CATHETERIZED  Final   Special Requests NONE  Final   Culture   Final    NO GROWTH Performed at [facility], [address], [place], [place] [address]    Report Status 09/28/2019 FINAL  Final  Culture, blood (Routine X 2) w Reflex to ID Panel     Status: None (Preliminary result)   Collection Time: 10/01/19 12:00 PM   Specimen: BLOOD  Result Value Ref  Range Status   Specimen Description BLOOD RIGHT ANTECUBITAL  Final   Special Requests   Final    BOTTLES DRAWN AEROBIC AND ANAEROBIC Blood Culture adequate volume   Culture   Final    NO GROWTH 2 DAYS Performed at [facility], [place] [address], [place], [place] [address]    Report Status PENDING  Incomplete  Culture, blood (Routine X 2) w Reflex to ID Panel     Status: None (Preliminary result)   Collection Time: 10/01/19 12:05 PM   Specimen: BLOOD RIGHT HAND  Result Value Ref Range Status   Specimen Description BLOOD RIGHT HAND  Final   Special Requests   Final    BOTTLES DRAWN AEROBIC AND ANAEROBIC Blood Culture adequate volume   Culture   Final    NO GROWTH 2 DAYS Performed at [facility], [place] [address], [place], [place] [address]    Report Status PENDING  Incomplete     Labs: BNP (last 3 results) No results for input(s): BNP in the last 8760 hours. Basic Metabolic Panel: Recent Labs  Lab 09/27/19 0433 09/28/19 0248 09/29/19 0755 09/30/19 0309 10/01/19 0528  NA 138 140 141 141 140  K 5.2* 4.1 4.5 4.4 4.3  CL 107 109 111 110 108  CO2 18* 20* 23 22 24   GLUCOSE 299* 196* 236* 202* 120*  BUN 42* 31* 20 17 20   CREATININE 2.04* 1.48*  1.24* 1.12* 1.12*  CALCIUM 7.9* 8.0* 8.3* 8.7* 8.9  MG  --   --  2.1  --   --   PHOS  --  2.6  --   --   --    Liver Function Tests: Recent Labs  Lab 09/26/19 1906 09/28/19 0248 09/29/19 0755  AST 32  --  53*  ALT 33  --  47*  ALKPHOS 102  --  127*  BILITOT 0.9  --  0.7  PROT 7.0  --  6.6  ALBUMIN 3.4* 2.8* 2.8*   Recent Labs  Lab 09/26/19 1906  LIPASE 26   Recent Labs  Lab 09/26/19 1940  AMMONIA 25   CBC: Recent Labs  Lab 09/26/19 1906 09/26/19 1940 09/28/19 0248 09/30/19 0309 10/01/19 0528  WBC 5.8  --  8.4 7.3 8.4  NEUTROABS 5.0  --  4.9 4.3  --   HGB 10.9* 10.5* 8.7* 9.0* 9.0*  HCT 33.6* 31.0* 27.1* 27.6* 27.8*  MCV 100.6*  --  101.9* 100.0 99.6  PLT 255  --  189 218 245   Cardiac  Enzymes: No results for input(s): CKTOTAL, CKMB, CKMBINDEX, TROPONINI in the last 168 hours. BNP: Invalid input(s): POCBNP CBG: Recent Labs  Lab 10/01/19 2027 10/01/19 2332 10/02/19 0335 10/02/19 0751 10/02/19 1137  GLUCAP 200* 236* 180* 146* 193*   D-Dimer No results for input(s): DDIMER in the last 72 hours. Hgb A1c No results for input(s): HGBA1C in the last 72 hours. Lipid Profile No results for input(s): CHOL, HDL, LDLCALC, TRIG, CHOLHDL, LDLDIRECT in the last 72 hours. Thyroid function studies No results for input(s): TSH, T4TOTAL, T3FREE, THYROIDAB in the last 72 hours.  Invalid input(s): FREET3 Anemia work up No results for input(s): VITAMINB12, FOLATE, FERRITIN, TIBC, IRON, RETICCTPCT in the last 72 hours. Urinalysis    Component Value Date/Time   COLORURINE YELLOW 09/27/2019 1307   APPEARANCEUR CLEAR 09/27/2019 1307   LABSPEC 1.017 09/27/2019 1307   PHURINE 5.0 09/27/2019 1307   GLUCOSEU NEGATIVE 09/27/2019 [place] 10/01/2016 1531   HGBUR NEGATIVE 09/27/2019 [place] 09/27/2019 1307   BILIRUBINUR negative 07/20/2018 1607   BILIRUBINUR neg 02/12/2016 1350   [place] 09/27/2019 1307   PROTEINUR 30 (A) 09/27/2019 1307   UROBILINOGEN 1.0 07/20/2018 1607   UROBILINOGEN 0.2 10/01/2016 1531   NITRITE NEGATIVE 09/27/2019 1307   LEUKOCYTESUR TRACE (A) 09/27/2019 1307   Sepsis Labs Invalid input(s): PROCALCITONIN,  WBC,  LACTICIDVEN Microbiology Recent Results (from the past 240 hour(s))  Blood Culture (routine x 2)     Status: Abnormal   Collection Time: 09/26/19  7:22 PM   Specimen: BLOOD LEFT FOREARM  Result Value Ref Range Status   Specimen Description BLOOD LEFT FOREARM  Final   Special Requests   Final    BOTTLES DRAWN AEROBIC AND ANAEROBIC Blood Culture adequate volume   Culture  Setup Time   Final    GRAM NEGATIVE RODS AEROBIC BOTTLE ONLY CRITICAL RESULT CALLED TO, READ BACK BY AND VERIFIED WITH: L CHEN  [facility] 09/28/19 1808 JDW Performed at [facility], [place] [address] [place], [place] [address]    Culture ESCHERICHIA COLI (A)  Final   Report Status 09/30/2019 FINAL  Final   Organism ID, Bacteria ESCHERICHIA COLI  Final      Susceptibility   Escherichia coli - MIC*    AMPICILLIN >=32 RESISTANT Resistant     CEFAZOLIN <=4 SENSITIVE Sensitive     CEFEPIME <=  1 SENSITIVE Sensitive     CEFTAZIDIME <=1 SENSITIVE Sensitive     CEFTRIAXONE <=1 SENSITIVE Sensitive     CIPROFLOXACIN <=0.25 SENSITIVE Sensitive     GENTAMICIN <=1 SENSITIVE Sensitive     IMIPENEM <=0.25 SENSITIVE Sensitive     TRIMETH/SULFA <=20 SENSITIVE Sensitive     AMPICILLIN/SULBACTAM 16 INTERMEDIATE Intermediate     PIP/TAZO <=4 SENSITIVE Sensitive     * ESCHERICHIA COLI  Blood Culture ID Panel (Reflexed)     Status: Abnormal   Collection Time: 09/26/19  7:22 PM  Result Value Ref Range Status   Enterococcus species NOT DETECTED NOT DETECTED Final   Listeria monocytogenes NOT DETECTED NOT DETECTED Final   Staphylococcus species NOT DETECTED NOT DETECTED Final   Staphylococcus aureus (BCID) NOT DETECTED NOT DETECTED Final   Streptococcus species NOT DETECTED NOT DETECTED Final   Streptococcus agalactiae NOT DETECTED NOT DETECTED Final   Streptococcus pneumoniae NOT DETECTED NOT DETECTED Final   Streptococcus pyogenes NOT DETECTED NOT DETECTED Final   Acinetobacter baumannii NOT DETECTED NOT DETECTED Final   Enterobacteriaceae species DETECTED (A) NOT DETECTED Final    Comment: Enterobacteriaceae represent a large family of gram-negative bacteria, not a single organism. CRITICAL RESULT CALLED TO, READ BACK BY AND VERIFIED WITH: L CHEN PHARMD 09/28/19 1808 JDW    Enterobacter cloacae complex NOT DETECTED NOT DETECTED Final   Escherichia coli DETECTED (A) NOT DETECTED Final    Comment: CRITICAL RESULT CALLED TO, READ BACK BY AND VERIFIED WITH: L CHEN PHARMD 09/28/19 1808 JDW    Klebsiella oxytoca NOT DETECTED NOT  DETECTED Final   Klebsiella pneumoniae NOT DETECTED NOT DETECTED Final   Proteus species NOT DETECTED NOT DETECTED Final   Serratia marcescens NOT DETECTED NOT DETECTED Final   Carbapenem resistance NOT DETECTED NOT DETECTED Final   Haemophilus influenzae NOT DETECTED NOT DETECTED Final   Neisseria meningitidis NOT DETECTED NOT DETECTED Final   Pseudomonas aeruginosa NOT DETECTED NOT DETECTED Final   Candida albicans NOT DETECTED NOT DETECTED Final   Candida glabrata NOT DETECTED NOT DETECTED Final   Candida krusei NOT DETECTED NOT DETECTED Final   Candida parapsilosis NOT DETECTED NOT DETECTED Final   Candida tropicalis NOT DETECTED NOT DETECTED Final    Comment: Performed at [facility], [place]. [address], [place], [place] [address]  Blood Culture (routine x 2)     Status: None   Collection Time: 09/26/19  7:40 PM   Specimen: BLOOD  Result Value Ref Range Status   Specimen Description BLOOD LEFT WRIST  Final   Special Requests   Final    BOTTLES DRAWN AEROBIC AND ANAEROBIC Blood Culture results may not be optimal due to an inadequate volume of blood received in culture bottles   Culture   Final    NO GROWTH 5 DAYS Performed at [facility], [place] [address], [place], [place] [address]    Report Status 10/01/2019 FINAL  Final  SARS Coronavirus 2 by RT PCR (hospital order, performed in [facility] hospital lab) Nasopharyngeal Nasopharyngeal Swab     Status: None   Collection Time: 09/26/19  7:41 PM   Specimen: Nasopharyngeal Swab  Result Value Ref Range Status   SARS Coronavirus 2 NEGATIVE NEGATIVE Final    Comment: (NOTE) SARS-CoV-2 target nucleic acids are NOT DETECTED. The SARS-CoV-2 RNA is generally detectable in upper and lower respiratory specimens during the acute phase of infection. The lowest concentration of SARS-CoV-2 viral copies this assay can detect is 250  copies / mL. A negative result does not preclude SARS-CoV-2 infection and should not be used as the  sole basis for treatment or other patient management decisions.  A negative result may occur with improper specimen collection / handling, submission of specimen other than nasopharyngeal swab, presence of viral mutation(s) within the areas targeted by this assay, and inadequate number of viral copies (<250 copies / mL). A negative result must be combined with clinical observations, patient history, and epidemiological information. Fact Sheet for Patients:   StrictlyIdeas.no Fact Sheet for Healthcare Providers: BankingDealers.co.za This test is not yet approved or cleared  by the [place] FDA and has been authorized for detection and/or diagnosis of SARS-CoV-2 by FDA under an Emergency Use Authorization (EUA).  This EUA will remain in effect (meaning this test can be used) for the duration of the COVID-19 declaration under Section 564(b)(1) of the Act, 21 U.S.C. section 360bbb-3(b)(1), unless the authorization is terminated or revoked sooner. Performed at [facility], [place] [address], [place], [place] [address]   Culture, Urine     Status: None   Collection Time: 09/27/19  1:07 PM   Specimen: Urine, Catheterized  Result Value Ref Range Status   Specimen Description URINE, CATHETERIZED  Final   Special Requests NONE  Final   Culture   Final    NO GROWTH Performed at [facility], [address] [place], [place] [address]    Report Status 09/28/2019 FINAL  Final  Culture, blood (Routine X 2) w Reflex to ID Panel     Status: None (Preliminary result)   Collection Time: 10/01/19 12:00 PM   Specimen: BLOOD  Result Value Ref Range Status   Specimen Description BLOOD RIGHT ANTECUBITAL  Final   Special Requests   Final    BOTTLES DRAWN AEROBIC AND ANAEROBIC Blood Culture adequate volume   Culture   Final    NO GROWTH 2 DAYS Performed at [facility], [place] [address], [place], [place] [address]    Report Status  PENDING  Incomplete  Culture, blood (Routine X 2) w Reflex to ID Panel     Status: None (Preliminary result)   Collection Time: 10/01/19 12:05 PM   Specimen: BLOOD RIGHT HAND  Result Value Ref Range Status   Specimen Description BLOOD RIGHT HAND  Final   Special Requests   Final    BOTTLES DRAWN AEROBIC AND ANAEROBIC Blood Culture adequate volume   Culture   Final    NO GROWTH 2 DAYS Performed at [facility], [place] [address], [place], [place] [address]    Report Status PENDING  Incomplete     Time coordinating discharge: 34 minutes.   SIGNED:   Hosie Poisson, MD  [facility] 10/03/2019, 9:25 AM

## 2019-10-06 LAB — CULTURE, BLOOD (ROUTINE X 2)
Culture: NO GROWTH
Culture: NO GROWTH
Special Requests: ADEQUATE
Special Requests: ADEQUATE

## 2019-10-13 DIAGNOSIS — J189 Pneumonia, unspecified organism: Secondary | ICD-10-CM | POA: Diagnosis not present

## 2019-10-13 DIAGNOSIS — J9601 Acute respiratory failure with hypoxia: Secondary | ICD-10-CM | POA: Diagnosis not present

## 2019-10-31 DIAGNOSIS — E1140 Type 2 diabetes mellitus with diabetic neuropathy, unspecified: Secondary | ICD-10-CM | POA: Diagnosis not present

## 2019-10-31 DIAGNOSIS — E7800 Pure hypercholesterolemia, unspecified: Secondary | ICD-10-CM | POA: Diagnosis not present

## 2019-10-31 DIAGNOSIS — E1165 Type 2 diabetes mellitus with hyperglycemia: Secondary | ICD-10-CM | POA: Diagnosis not present

## 2019-10-31 DIAGNOSIS — Z794 Long term (current) use of insulin: Secondary | ICD-10-CM | POA: Diagnosis not present

## 2019-11-03 ENCOUNTER — Emergency Department (HOSPITAL_BASED_OUTPATIENT_CLINIC_OR_DEPARTMENT_OTHER)
Admission: EM | Admit: 2019-11-03 | Discharge: 2019-11-03 | Disposition: A | Discharge status: Home / Self Care | Payer: PPO | Attending: Emergency Medicine | Admitting: Emergency Medicine

## 2019-11-03 ENCOUNTER — Encounter (HOSPITAL_BASED_OUTPATIENT_CLINIC_OR_DEPARTMENT_OTHER): Payer: Self-pay | Admitting: Emergency Medicine

## 2019-11-03 ENCOUNTER — Other Ambulatory Visit: Payer: Self-pay

## 2019-11-03 ENCOUNTER — Ambulatory Visit: Payer: PPO | Admitting: Adult Health

## 2019-11-03 DIAGNOSIS — E1165 Type 2 diabetes mellitus with hyperglycemia: Secondary | ICD-10-CM | POA: Diagnosis not present

## 2019-11-03 DIAGNOSIS — R739 Hyperglycemia, unspecified: Secondary | ICD-10-CM | POA: Diagnosis not present

## 2019-11-03 DIAGNOSIS — Z91040 Latex allergy status: Secondary | ICD-10-CM | POA: Insufficient documentation

## 2019-11-03 DIAGNOSIS — Z7982 Long term (current) use of aspirin: Secondary | ICD-10-CM | POA: Insufficient documentation

## 2019-11-03 DIAGNOSIS — E669 Obesity, unspecified: Secondary | ICD-10-CM | POA: Insufficient documentation

## 2019-11-03 DIAGNOSIS — E11319 Type 2 diabetes mellitus with unspecified diabetic retinopathy without macular edema: Secondary | ICD-10-CM | POA: Insufficient documentation

## 2019-11-03 DIAGNOSIS — E1142 Type 2 diabetes mellitus with diabetic polyneuropathy: Secondary | ICD-10-CM | POA: Diagnosis not present

## 2019-11-03 DIAGNOSIS — Z79899 Other long term (current) drug therapy: Secondary | ICD-10-CM | POA: Insufficient documentation

## 2019-11-03 DIAGNOSIS — R112 Nausea with vomiting, unspecified: Secondary | ICD-10-CM | POA: Diagnosis not present

## 2019-11-03 DIAGNOSIS — R1110 Vomiting, unspecified: Secondary | ICD-10-CM | POA: Diagnosis present

## 2019-11-03 DIAGNOSIS — I10 Essential (primary) hypertension: Secondary | ICD-10-CM | POA: Diagnosis not present

## 2019-11-03 DIAGNOSIS — Z7984 Long term (current) use of oral hypoglycemic drugs: Secondary | ICD-10-CM | POA: Diagnosis not present

## 2019-11-03 DIAGNOSIS — R110 Nausea: Secondary | ICD-10-CM | POA: Diagnosis not present

## 2019-11-03 DIAGNOSIS — E1140 Type 2 diabetes mellitus with diabetic neuropathy, unspecified: Secondary | ICD-10-CM | POA: Insufficient documentation

## 2019-11-03 LAB — CBC
HCT: 33.3 % — ABNORMAL LOW (ref 36.0–46.0)
Hemoglobin: 10.9 g/dL — ABNORMAL LOW (ref 12.0–15.0)
MCH: 32.4 pg (ref 26.0–34.0)
MCHC: 32.7 g/dL (ref 30.0–36.0)
MCV: 99.1 fL (ref 80.0–100.0)
Platelets: 263 10*3/uL (ref 150–400)
RBC: 3.36 MIL/uL — ABNORMAL LOW (ref 3.87–5.11)
RDW: 14.5 % (ref 11.5–15.5)
WBC: 12.4 10*3/uL — ABNORMAL HIGH (ref 4.0–10.5)
nRBC: 0 % (ref 0.0–0.2)

## 2019-11-03 LAB — BASIC METABOLIC PANEL
Anion gap: 16 — ABNORMAL HIGH (ref 5–15)
BUN: 39 mg/dL — ABNORMAL HIGH (ref 8–23)
CO2: 26 mmol/L (ref 22–32)
Calcium: 9 mg/dL (ref 8.9–10.3)
Chloride: 94 mmol/L — ABNORMAL LOW (ref 98–111)
Creatinine, Ser: 1.75 mg/dL — ABNORMAL HIGH (ref 0.44–1.00)
GFR calc Af Amer: 36 mL/min — ABNORMAL LOW (ref >60)
GFR calc non Af Amer: 31 mL/min — ABNORMAL LOW (ref >60)
Glucose, Bld: 469 mg/dL — ABNORMAL HIGH (ref 70–99)
Potassium: 4.2 mmol/L (ref 3.5–5.1)
Sodium: 136 mmol/L (ref 135–145)

## 2019-11-03 LAB — URINALYSIS, ROUTINE W REFLEX MICROSCOPIC
Bilirubin Urine: NEGATIVE
Glucose, UA: 250 mg/dL — AB
Ketones, ur: NEGATIVE mg/dL
Leukocytes,Ua: NEGATIVE
Nitrite: NEGATIVE
Protein, ur: 100 mg/dL — AB
Specific Gravity, Urine: 1.02 (ref 1.005–1.030)
pH: 6 (ref 5.0–8.0)

## 2019-11-03 LAB — URINALYSIS, MICROSCOPIC (REFLEX): Squamous Epithelial / HPF: NONE SEEN (ref 0–5)

## 2019-11-03 LAB — CBG MONITORING, ED
Glucose-Capillary: 466 mg/dL — ABNORMAL HIGH (ref 70–99)
Glucose-Capillary: 265 mg/dL — ABNORMAL HIGH (ref 70–99)
Glucose-Capillary: 174 mg/dL — ABNORMAL HIGH (ref 70–99)

## 2019-11-03 MED ORDER — ONDANSETRON 4 MG PO TBDP
ORAL_TABLET | ORAL | 0 refills | Status: DC
Start: 2019-11-03 — End: 2020-01-18

## 2019-11-03 MED ORDER — ONDANSETRON HCL 4 MG/2ML IJ SOLN
4.0000 mg | Freq: Once | INTRAMUSCULAR | Status: AC
  Administered 2019-11-03: 4 mg via INTRAVENOUS
  Filled 2019-11-03: qty 2

## 2019-11-03 MED ORDER — SODIUM CHLORIDE 0.9 % IV BOLUS
1000.0000 mL | Freq: Once | INTRAVENOUS | Status: AC
  Administered 2019-11-03: 1000 mL via INTRAVENOUS

## 2019-11-03 NOTE — ED Triage Notes (Signed)
Pt reports vomiting and hyperglycemia today. Denies pain.

## 2019-11-03 NOTE — Discharge Instructions (Addendum)
Monitor blood sugars closely.  Eat a bland diet.  Follow-up with your primary care doctor.  Return here as needed if you have any worsening symptoms including vomiting, fevers, abdominal pain, or other worsening symptoms.  Your creatinine (kidney function) was a bit more elevated than prior exams and this needs to be followed up by your primary care doctor.

## 2019-11-03 NOTE — ED Notes (Signed)
Requested urine specimen.  Pt sts she is not able to void at this time.

## 2019-11-03 NOTE — ED Provider Notes (Addendum)
[facility] EMERGENCY DEPARTMENT Provider Note   CSN: [id number] Arrival date & time: 11/03/19  1741     History Chief Complaint  Patient presents with  . Emesis  . Hyperglycemia    Dana [name] is a 64 y.o. female.  Patient is a 64 year old female who presents with high blood sugars.  She said that she left out some food from [employer] on Monday.  She ate it that day and then yesterday started feeling nauseated and had a couple episodes of vomiting.  She has not had any ongoing vomiting but still feels a little nauseated.  She does not have any pain in her abdomen.  No fevers.  No cough or cold symptoms.  No chest pain or shortness of breath.  No rashes or redness to her skin.  She noticed that her blood sugar was high and she came in here for treatment because of that.  She was treated for pneumonia/sepsis last month but she says it does not feel at all like when she was sick with that.        Past Medical History:  Diagnosis Date  . Bipolar 1 disorder ([place])   . Cellulitis and abscess of foot 03/08/2015  . Diabetes mellitus    INSULIN DEPENDENT  . Diabetic neuropathy ([place])   . 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 H/O hiatal hernia   . Hyperlipidemia   . Hypertension    past hx of  . Iron malabsorption 09/26/2015  . Numbness and tingling    Hx; of in B/LLE and B/LUE  . Other iron deficiency anemias 09/20/2015    Patient Active Problem List   Diagnosis Date Noted  . Severe sepsis ([place]) 09/27/2019  . Acute respiratory failure with hypoxia ([place]) 09/27/2019  . Emesis 09/27/2019  . Hyperglycemia 09/27/2019  . Sepsis ([place]) 09/27/2019  . Toxic metabolic encephalopathy   . Coffee ground emesis   . Hyperkalemia   . Pneumonia 09/26/2019  . Partial nontraumatic amputation of left foot ([place]) 04/28/2017  . 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 Pleural effusion 01/19/2017  . Contracture of left Achilles tendon 01/16/2017  .  Allergic rhinitis 01/03/2017  . Gastroesophageal reflux disease without esophagitis 01/03/2017  . Hiatal hernia 02/03/2016  . Anemia associated with diabetes mellitus ([place]) 11/07/2015  . Iron malabsorption 09/26/2015  . Other iron deficiency anemia 09/20/2015  . 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 Diabetic retinopathy associated with diabetes mellitus due to underlying condition ([place]) 03/15/2015  . DM (diabetes mellitus) type II uncontrolled with eye manifestation ([place]) 11/08/2014  . Hyperlipidemia LDL goal <100 11/08/2014  . Diabetic polyneuropathy associated with type 2 diabetes mellitus ([place]) 11/08/2014  . Partial nontraumatic amputation of foot ([place]) 11/08/2014  . Type 2 diabetes mellitus without complication, with long-term current use of insulin ([place]) 10/24/2011  . Bipolar 1 disorder ([place]) 10/24/2011    Past Surgical History:  Procedure Laterality Date  . ABDOMINAL HYSTERECTOMY    . ADENOIDECTOMY     Hx: of  . AMPUTATION  10/24/2011   Procedure: AMPUTATION RAY;  Surgeon: Newt Minion, MD;  Location: [facility];  Service: Orthopedics;  Laterality: Right;  Right Foot 3rd Ray Amputation  . AMPUTATION Right 12/16/2012   Procedure: Right Foot Transmetatarsal Amputation;  Surgeon: Newt Minion, MD;  Location: [facility];  Service: Orthopedics;  Laterality: Right;  . AMPUTATION Left 03/09/2015   Procedure: LEFT FOOT 1ST RAY AMPUTATION;  Surgeon: Newt Minion, MD;  Location: [facility];  Service: Orthopedics;  Laterality: Left;  . BLADDER SURGERY     x 2, tacked 1st time; mesh "eroded", had to be removed  . Bladder Tact   2002  . BREAST SURGERY Left    "knot removed"  . CARPAL TUNNEL RELEASE Left   . COLON SURGERY    . EYE SURGERY  11/17/2017  . NASAL SEPTUM SURGERY  1976  . TONSILLECTOMY     age 2's     OB History   No obstetric history on file.     Family History  Problem Relation Age of Onset  . Diabetes Mother   . Mental illness Mother   . Bipolar disorder Mother   .  Hypertension Father   . Osteoarthritis Father   . Heart disease Father   . Diabetes Sister   . Early death Brother        MVA  . Healthy Sister   . Diabetes Maternal Grandmother     Social History   Tobacco Use  . Smoking status: Never Smoker  . Smokeless tobacco: Never Used  Vaping Use  . Vaping Use: Never used  Substance Use Topics  . Alcohol use: No    Alcohol/week: 0.0 standard drinks  . Drug use: No    Home Medications Prior to Admission medications   Medication Sig Start Date End Date Taking? Authorizing Provider  APOAEQUORIN PO Take 1 tablet by mouth daily.    [provider]  aspirin 81 MG EC tablet Take 81 mg by mouth daily. Swallow whole.    [provider]  atorvastatin (LIPITOR) 20 MG tablet Take 1 tablet (20 mg total) by mouth at bedtime. 07/20/18   Scot Jun, FNP  BD VEO INSULIN SYRINGE U/F 31G X 15/64" 1 ML MISC Administer insulin up 3 times per day per sliding scale E.11.9 07/20/18   Scot Jun, FNP  busPIRone (BUSPAR) 7.5 MG tablet Take 1 tablet (7.5 mg total) by mouth 2 (two) times daily. 10/02/19   Hosie Poisson, MD  Continuous Blood Gluc Sensor (FREESTYLE LIBRE 14 DAY SENSOR) MISC SMARTSIG:1 Each Topical Every 2 Weeks 07/17/19   [provider]  Ferrous Sulfate (IRON) 325 (65 Fe) MG TABS Take 325 mg by mouth daily.     [provider]  furosemide (LASIX) 20 MG tablet TAKE 1 TABLET BY MOUTH TWICE DAILY Patient taking differently: Take 40 mg by mouth 2 (two) times daily.  02/24/18   Brunetta Jeans, PA-C  gabapentin (NEURONTIN) 400 MG capsule Take 1 capsule (400 mg total) by mouth 3 (three) times daily. 10/02/19   Hosie Poisson, MD  glucose blood (FREESTYLE LITE) test strip USE AS INSTRUCTED TO CHECK BLOOD SUGAR 3 TIMES DAILY. DX:E11.65 07/20/18   Scot Jun, FNP  guaiFENesin (ROBITUSSIN) 100 MG/5ML SOLN Take 5 mLs (100 mg total) by mouth every 6 (six) hours as needed for cough or to loosen phlegm. 10/02/19    Hosie Poisson, MD  HUMULIN R U-500 KWIKPEN 500 UNIT/ML kwikpen Inject 45 Units into the skin 3 (three) times daily with meals.  07/31/19   [provider]  JANUVIA 50 MG tablet Take 50 mg by mouth daily. 07/07/19   [provider]  lamoTRIgine (LAMICTAL) 100 MG tablet Take 100 mg by mouth daily. 09/15/19   [provider]  Lancets (FREESTYLE) lancets Use as instructed to check blood sugar 3 times per day dx code E11.39 07/20/18   Scot Jun, FNP  metFORMIN (  GLUCOPHAGE-XR) 500 MG 24 hr tablet TAKE 2 TABLETS IN THE MORNING AND 2 TABLETS AT NIGHT. Patient taking differently: Take 1,000 mg by mouth 2 (two) times daily.  12/09/17   Elayne Snare, MD  metoprolol succinate (TOPROL-XL) 25 MG 24 hr tablet Take 1 tablet (25 mg total) by mouth daily. 10/03/19   Hosie Poisson, MD  Multiple Vitamins-Minerals (CENTRUM ADULTS PO) Take 1 tablet by mouth daily.    [provider]  NOVOLIN N RELION 100 UNIT/ML injection INJECT 60 UNITS AM and 50 units PM SUBCUTANEOUSLY TWICE DAILY FOR 30 DAYS Patient taking differently: Inject 75 Units into the skin 3 (three) times daily.  07/20/18   Scot Jun, FNP  ondansetron (ZOFRAN ODT) 4 MG disintegrating tablet 4mg  ODT q4 hours prn nausea/vomit 11/03/19   Malvin Johns, MD  Ostomy Supplies (SKIN TAC ADHESIVE BARRIER WIPE) MISC 1 each by Does not apply route daily. 11/25/17   Elayne Snare, MD  pantoprazole (PROTONIX) 40 MG tablet Take 1 tablet (40 mg total) by mouth daily. 07/20/18   Scot Jun, FNP  QUEtiapine (SEROQUEL) 100 MG tablet Take 1 tablet (100 mg total) by mouth at bedtime. 10/02/19   Hosie Poisson, MD    Allergies    Keflex [cephalexin], Lithium, Ativan [lorazepam], Demerol [meperidine], Oxycodone, Codeine, Doxycycline, Darvocet [propoxyphene n-acetaminophen], Latex, and Propoxyphene  Review of Systems   Review of Systems  Constitutional: Negative for chills, diaphoresis, fatigue and fever.  HENT: Negative for  congestion, rhinorrhea and sneezing.   Eyes: Negative.   Respiratory: Negative for cough, chest tightness and shortness of breath.   Cardiovascular: Negative for chest pain and leg swelling.  Gastrointestinal: Positive for nausea and vomiting. Negative for abdominal pain, blood in stool and diarrhea.  Genitourinary: Negative for difficulty urinating, flank pain, frequency and hematuria.  Musculoskeletal: Negative for arthralgias and back pain.  Skin: Negative for rash.  Neurological: Negative for dizziness, speech difficulty, weakness, numbness and headaches.    Physical Exam Updated Vital Signs BP 138/79 (BP Location: Right Arm)   Pulse 96   Temp 99.3 F (37.4 C) (Oral)   Resp 18   Ht 5\' 1"  (1.549 m)   Wt 98.4 kg   SpO2 98%   BMI 41.00 kg/m   Physical Exam Constitutional:      Appearance: She is well-developed. She is obese.  HENT:     Head: Normocephalic and atraumatic.  Eyes:     Pupils: Pupils are equal, round, and reactive to light.  Cardiovascular:     Rate and Rhythm: Normal rate and regular rhythm.     Heart sounds: Normal heart sounds.  Pulmonary:     Effort: Pulmonary effort is normal. No respiratory distress.     Breath sounds: Normal breath sounds. No wheezing or rales.  Chest:     Chest wall: No tenderness.  Abdominal:     General: Bowel sounds are normal.     Palpations: Abdomen is soft.     Tenderness: There is no abdominal tenderness. There is no guarding or rebound.  Musculoskeletal:        General: Normal range of motion.     Cervical back: Normal range of motion and neck supple.     Comments: Status post right lower leg amputation.  She has some mild chronic edema in her left leg but no warmth or erythema.  Lymphadenopathy:     Cervical: No cervical adenopathy.  Skin:    General: Skin is warm and dry.  Findings: No rash.  Neurological:     Mental Status: She is alert and oriented to person, place, and time.     ED Results / Procedures /  Treatments   Labs (all labs ordered are listed, but only abnormal results are displayed) Labs Reviewed  BASIC METABOLIC PANEL - Abnormal; Notable for the following components:      Result Value   Chloride 94 (*)    Glucose, Bld 469 (*)    BUN 39 (*)    Creatinine, Ser 1.75 (*)    GFR calc non Af Amer 31 (*)    GFR calc Af Amer 36 (*)    Anion gap 16 (*)    All other components within normal limits  CBC - Abnormal; Notable for the following components:   WBC 12.4 (*)    RBC 3.36 (*)    Hemoglobin 10.9 (*)    HCT 33.3 (*)    All other components within normal limits  URINALYSIS, ROUTINE W REFLEX MICROSCOPIC - Abnormal; Notable for the following components:   APPearance CLOUDY (*)    Glucose, UA 250 (*)    Hgb urine dipstick SMALL (*)    Protein, ur 100 (*)    All other components within normal limits  URINALYSIS, MICROSCOPIC (REFLEX) - Abnormal; Notable for the following components:   Bacteria, UA MANY (*)    All other components within normal limits  CBG MONITORING, ED - Abnormal; Notable for the following components:   Glucose-Capillary 466 (*)    All other components within normal limits  CBG MONITORING, ED - Abnormal; Notable for the following components:   Glucose-Capillary 265 (*)    All other components within normal limits  CBG MONITORING, ED - Abnormal; Notable for the following components:   Glucose-Capillary 174 (*)    All other components within normal limits  URINE CULTURE  CBG MONITORING, ED    EKG None  Radiology No results found.  Procedures Procedures (including critical care time)  Medications Ordered in ED Medications  sodium chloride 0.9 % bolus 1,000 mL (1,000 mLs Intravenous New Bag/Given 11/03/19 2200)  ondansetron (ZOFRAN) injection 4 mg (4 mg Intravenous Given 11/03/19 2200)    ED Course  I have reviewed the triage vital signs and the nursing notes.  Pertinent labs & imaging results that were available during my care of the patient were  reviewed by me and considered in my medical decision making (see chart for details).    MDM Rules/Calculators/A&P                          Patient is a 64 year old female who presents with nausea and elevated blood sugars.  She does not have any suggestions of DKA on her blood work.  No significant anion gap.  Her glucose is elevated but other labs are nonconcerning.  She has no associated abdominal pain.  Her white count is minimally elevated but she does not have any fevers or other suggestions of infection.  Her creatinine is slightly more elevated than prior values.  Her BUN is also elevated which could mean that she is a bit dehydrated.  Her hemoglobin is low but similar to prior values.  She was given IV fluids.  Her glucose improved to under 200.  She feels much better and is eating and drinking without any nausea.  She denies any urinary symptoms.  Her urine has some bacteria but only small leukocyte esterase.  Given her  lack of symptoms, I will send it for culture but not treated at this point.  She was discharged home in good condition.  She was encouraged to follow-up with her PCP and advised that she will need to have her creatinine rechecked.  She was given strict return precautions. Final Clinical Impression(s) / ED Diagnoses Final diagnoses:  Hyperglycemia  Nausea    Rx / DC Orders ED Discharge Orders         Ordered    ondansetron (ZOFRAN ODT) 4 MG disintegrating tablet     Discontinue  Reprint     11/03/19 2320           Malvin Johns, MD 11/03/19 2324    Malvin Johns, MD 11/03/19 2325

## 2019-11-06 LAB — URINE CULTURE: Culture: >=100000 — AB

## 2019-11-07 ENCOUNTER — Telehealth: Payer: Self-pay

## 2019-11-07 NOTE — Telephone Encounter (Signed)
Post ED Visit - Positive Culture Follow-up: Unsuccessful Patient Follow-up  Culture assessed and recommendations reviewed by:  []  Elenor Quinones, Pharm.D. []  Heide Guile, Pharm.D., BCPS AQ-ID []  Parks Neptune, Pharm.D., BCPS []  Alycia Rossetti, Pharm.D., BCPS []  [place], [place].D., BCPS, AAHIVP []  Legrand Como, Pharm.D., BCPS, AAHIVP []  Wynell Balloon, PharmD []  Vincenza Hews, PharmD, BCPS Georgiann Hahn Pharm D Positive urine culture  [x]  Patient discharged without antimicrobial prescription and treatment is now indicated []  Organism is resistant to prescribed ED discharge antimicrobial []  Patient with positive blood cultures  Needs Fosfomycin 3g x 1 per [facility]  Unable to contact patient after 3 attempts, letter will be sent to address on file  Genia Del 11/07/2019, 12:49 PM

## 2019-11-07 NOTE — Progress Notes (Signed)
ED Antimicrobial Stewardship Positive Culture Follow Up   Dana [name] is an 64 y.o. female who presented to [facility] on 11/03/2019 with a chief complaint of  Chief Complaint  Patient presents with  . Emesis  . Hyperglycemia    Recent Results (from the past 720 hour(s))  Urine culture     Status: Abnormal   Collection Time: 11/03/19 11:25 PM   Specimen: Urine, Random  Result Value Ref Range Status   Specimen Description   Final    URINE, RANDOM Performed at [facility], [address] [place], [place] [address]    Special Requests   Final    NONE Performed at [facility], [place] [place], [place] [address]    Culture >=100,000 COLONIES/mL ESCHERICHIA COLI (A)  Final   Report Status 11/06/2019 FINAL  Final   Organism ID, Bacteria ESCHERICHIA COLI (A)  Final      Susceptibility   Escherichia coli - MIC*    AMPICILLIN >=32 RESISTANT Resistant     CEFAZOLIN <=4 SENSITIVE Sensitive     CEFTRIAXONE <=0.25 SENSITIVE Sensitive     CIPROFLOXACIN <=0.25 SENSITIVE Sensitive     GENTAMICIN <=1 SENSITIVE Sensitive     IMIPENEM <=0.25 SENSITIVE Sensitive     NITROFURANTOIN <=16 SENSITIVE Sensitive     TRIMETH/SULFA <=20 SENSITIVE Sensitive     AMPICILLIN/SULBACTAM >=32 RESISTANT Resistant     PIP/TAZO <=4 SENSITIVE Sensitive     * >=100,000 COLONIES/mL ESCHERICHIA COLI    [x]  Patient discharged originally without antimicrobial agent  New antibiotic prescription: Start fosfomycin 3g PO x 1 dose.  ED Provider: Rodell Perna, PA-C   Margretta Sidle Dohlen 11/07/2019, 9:03 AM Clinical Pharmacist Monday - Friday phone -  [phone] Saturday - Sunday phone - [phone]

## 2019-11-11 DIAGNOSIS — E113512 Type 2 diabetes mellitus with proliferative diabetic retinopathy with macular edema, left eye: Secondary | ICD-10-CM | POA: Diagnosis not present

## 2019-11-12 DIAGNOSIS — J189 Pneumonia, unspecified organism: Secondary | ICD-10-CM | POA: Diagnosis not present

## 2019-11-12 DIAGNOSIS — J9601 Acute respiratory failure with hypoxia: Secondary | ICD-10-CM | POA: Diagnosis not present

## 2019-11-14 ENCOUNTER — Telehealth: Payer: Self-pay | Admitting: Emergency Medicine

## 2019-11-14 NOTE — ED Provider Notes (Signed)
Patient with a positive urine culture for E. coli.  Reportedly, fosfomycin was ordered to the pharmacy but the pharmacy called and said her insurance will not approve it.  It looks like that E. coli is sensitive to Bactrim and she does not have any reported sulfa allergies so we will start Bactrim.   Malvin Johns, MD 11/14/19 [phone]

## 2019-11-15 DIAGNOSIS — D631 Anemia in chronic kidney disease: Secondary | ICD-10-CM | POA: Diagnosis not present

## 2019-11-15 DIAGNOSIS — M7989 Other specified soft tissue disorders: Secondary | ICD-10-CM | POA: Diagnosis not present

## 2019-11-15 DIAGNOSIS — E1122 Type 2 diabetes mellitus with diabetic chronic kidney disease: Secondary | ICD-10-CM | POA: Diagnosis not present

## 2019-11-15 DIAGNOSIS — F39 Unspecified mood [affective] disorder: Secondary | ICD-10-CM | POA: Diagnosis not present

## 2019-11-25 DIAGNOSIS — F3132 Bipolar disorder, current episode depressed, moderate: Secondary | ICD-10-CM | POA: Diagnosis not present

## 2019-12-02 DIAGNOSIS — E113511 Type 2 diabetes mellitus with proliferative diabetic retinopathy with macular edema, right eye: Secondary | ICD-10-CM | POA: Diagnosis not present

## 2019-12-16 DIAGNOSIS — E113512 Type 2 diabetes mellitus with proliferative diabetic retinopathy with macular edema, left eye: Secondary | ICD-10-CM | POA: Diagnosis not present

## 2019-12-19 DIAGNOSIS — I8310 Varicose veins of unspecified lower extremity with inflammation: Secondary | ICD-10-CM | POA: Diagnosis not present

## 2019-12-23 DIAGNOSIS — F3181 Bipolar II disorder: Secondary | ICD-10-CM | POA: Diagnosis not present

## 2020-01-06 DIAGNOSIS — E113511 Type 2 diabetes mellitus with proliferative diabetic retinopathy with macular edema, right eye: Secondary | ICD-10-CM | POA: Diagnosis not present

## 2020-01-10 ENCOUNTER — Inpatient Hospital Stay: Payer: PPO

## 2020-01-10 ENCOUNTER — Inpatient Hospital Stay: Payer: PPO | Admitting: Family

## 2020-01-17 DIAGNOSIS — I8310 Varicose veins of unspecified lower extremity with inflammation: Secondary | ICD-10-CM | POA: Diagnosis not present

## 2020-01-17 DIAGNOSIS — D485 Neoplasm of uncertain behavior of skin: Secondary | ICD-10-CM | POA: Diagnosis not present

## 2020-01-17 DIAGNOSIS — L219 Seborrheic dermatitis, unspecified: Secondary | ICD-10-CM | POA: Diagnosis not present

## 2020-01-18 ENCOUNTER — Telehealth: Payer: Self-pay | Admitting: Family

## 2020-01-18 ENCOUNTER — Inpatient Hospital Stay: Payer: PPO | Attending: Hematology & Oncology

## 2020-01-18 ENCOUNTER — Inpatient Hospital Stay (HOSPITAL_BASED_OUTPATIENT_CLINIC_OR_DEPARTMENT_OTHER): Payer: PPO | Admitting: Family

## 2020-01-18 ENCOUNTER — Encounter: Payer: Self-pay | Admitting: Family

## 2020-01-18 ENCOUNTER — Other Ambulatory Visit: Payer: Self-pay

## 2020-01-18 ENCOUNTER — Other Ambulatory Visit: Payer: Self-pay | Admitting: Family

## 2020-01-18 VITALS — BP 131/76 | HR 88 | Temp 99.0°F | Resp 19 | Ht 61.02 in | Wt 226.1 lb

## 2020-01-18 DIAGNOSIS — D631 Anemia in chronic kidney disease: Secondary | ICD-10-CM

## 2020-01-18 DIAGNOSIS — M7989 Other specified soft tissue disorders: Secondary | ICD-10-CM | POA: Insufficient documentation

## 2020-01-18 DIAGNOSIS — Z794 Long term (current) use of insulin: Secondary | ICD-10-CM | POA: Insufficient documentation

## 2020-01-18 DIAGNOSIS — E1165 Type 2 diabetes mellitus with hyperglycemia: Secondary | ICD-10-CM | POA: Diagnosis not present

## 2020-01-18 DIAGNOSIS — D508 Other iron deficiency anemias: Secondary | ICD-10-CM

## 2020-01-18 DIAGNOSIS — E1140 Type 2 diabetes mellitus with diabetic neuropathy, unspecified: Secondary | ICD-10-CM | POA: Insufficient documentation

## 2020-01-18 DIAGNOSIS — D509 Iron deficiency anemia, unspecified: Secondary | ICD-10-CM | POA: Diagnosis not present

## 2020-01-18 LAB — CBC WITH DIFFERENTIAL (CANCER CENTER ONLY)
Abs Immature Granulocytes: 0.11 10*3/uL — ABNORMAL HIGH (ref 0.00–0.07)
Basophils Absolute: 0 10*3/uL (ref 0.0–0.1)
Basophils Relative: 1 %
Eosinophils Absolute: 0.3 10*3/uL (ref 0.0–0.5)
Eosinophils Relative: 5 %
HCT: 31.2 % — ABNORMAL LOW (ref 36.0–46.0)
Hemoglobin: 10.1 g/dL — ABNORMAL LOW (ref 12.0–15.0)
Immature Granulocytes: 2 %
Lymphocytes Relative: 22 %
Lymphs Abs: 1.5 10*3/uL (ref 0.7–4.0)
MCH: 32.5 pg (ref 26.0–34.0)
MCHC: 32.4 g/dL (ref 30.0–36.0)
MCV: 100.3 fL — ABNORMAL HIGH (ref 80.0–100.0)
Monocytes Absolute: 0.5 10*3/uL (ref 0.1–1.0)
Monocytes Relative: 8 %
Neutro Abs: 4.4 10*3/uL (ref 1.7–7.7)
Neutrophils Relative %: 62 %
Platelet Count: 280 10*3/uL (ref 150–400)
RBC: 3.11 MIL/uL — ABNORMAL LOW (ref 3.87–5.11)
RDW: 13.9 % (ref 11.5–15.5)
WBC Count: 7 10*3/uL (ref 4.0–10.5)
nRBC: 0 % (ref 0.0–0.2)

## 2020-01-18 LAB — CMP (CANCER CENTER ONLY)
ALT: 32 U/L (ref 0–44)
AST: 28 U/L (ref 15–41)
Albumin: 4.1 g/dL (ref 3.5–5.0)
Alkaline Phosphatase: 85 U/L (ref 38–126)
Anion gap: 14 (ref 5–15)
BUN: 36 mg/dL — ABNORMAL HIGH (ref 8–23)
CO2: 31 mmol/L (ref 22–32)
Calcium: 9.9 mg/dL (ref 8.9–10.3)
Chloride: 98 mmol/L (ref 98–111)
Creatinine: 2.2 mg/dL — ABNORMAL HIGH (ref 0.44–1.00)
GFR, Est AFR Am: 27 mL/min — ABNORMAL LOW (ref >60)
GFR, Estimated: 23 mL/min — ABNORMAL LOW (ref >60)
Glucose, Bld: 159 mg/dL — ABNORMAL HIGH (ref 70–99)
Potassium: 4.7 mmol/L (ref 3.5–5.1)
Sodium: 143 mmol/L (ref 135–145)
Total Bilirubin: 0.4 mg/dL (ref 0.3–1.2)
Total Protein: 7.5 g/dL (ref 6.5–8.1)

## 2020-01-18 LAB — RETICULOCYTES
Immature Retic Fract: 18.6 % — ABNORMAL HIGH (ref 2.3–15.9)
RBC.: 3.1 MIL/uL — ABNORMAL LOW (ref 3.87–5.11)
Retic Count, Absolute: 85.3 10*3/uL (ref 19.0–186.0)
Retic Ct Pct: 2.8 % (ref 0.4–3.1)

## 2020-01-18 NOTE — Progress Notes (Signed)
Hematology and Oncology Follow Up Visit  Dana [name] [id number] 07-Feb-1956 64 y.o. 01/18/2020   Principle Diagnosis:  Iron deficiency anemia Poorly controlled diabetes type II  Current Therapy:        IV iron as indicated   Interim History:  [name] is here today for follow-up. She is doing well but notes some occasional fatigue.  She has a right BKA and is ambulating nicely with her prosthesis. No falls or syncopal episodes to report.  She has chronic swelling in the left foot and ankle. This waxes and wanes. Pedal pulse is 2+.  No fever, chills, n/v, cough, rash, dizziness, SOB, chest pain, palpitations, abdominal pain or changes in bowel or bladder habits.  No episodes of bleeding. No bruising or petechiae.  She has neuropathy in her hands and foot but states that this is stable.  He blood sugars are not well controlled. She states that they vary between 60-200. She is on insulin and metformin.  She states that she has a good appetite and is staying well hydrated. Her weight is stable.   ECOG Performance Status: 1 - Symptomatic but completely ambulatory  Medications:  Allergies as of 01/18/2020      Reactions   Ativan [lorazepam] Anaphylaxis      Codeine Itching, Other (See Comments)   hallucinations   Demerol [meperidine] Anaphylaxis   Keflex [cephalexin] Shortness Of Breath, Rash   Tremors   Lithium Nausea And Vomiting, Other (See Comments)   Can not keep this medication down. It makes her terribly ill.   Doxycycline Other (See Comments)   Severe muscle tremor   Oxycodone Other (See Comments)   Other reaction(s): OTHER Abnormal behavior   Darvocet [propoxyphene N-acetaminophen] Itching   Latex Itching   Other reaction(s): OTHER   Propoxyphene Itching      Medication List       Accurate as of January 18, 2020 12:06 PM. If you have any questions, ask your nurse or doctor.        STOP taking these medications   guaiFENesin 100 MG/5ML Soln Commonly known  as: ROBITUSSIN Stopped by: Laverna Peace, NP   ondansetron 4 MG disintegrating tablet Commonly known as: Zofran ODT Stopped by: Laverna Peace, NP     TAKE these medications   APOAEQUORIN PO Take 1 tablet by mouth daily.   aspirin 81 MG EC tablet Take 81 mg by mouth daily. Swallow whole.   atorvastatin 20 MG tablet Commonly known as: LIPITOR Take 1 tablet (20 mg total) by mouth at bedtime.   BD Veo Insulin Syringe U/F 31G X 15/64" 1 ML Misc Generic drug: Insulin Syringe-Needle U-100 Administer insulin up 3 times per day per sliding scale E.11.9   busPIRone 7.5 MG tablet Commonly known as: BUSPAR Take 1 tablet (7.5 mg total) by mouth 2 (two) times daily.   CENTRUM ADULTS PO Take 1 tablet by mouth daily.   freestyle lancets Use as instructed to check blood sugar 3 times per day dx code E11.39   FreeStyle Libre 14 Day Sensor Misc SMARTSIG:1 Each Topical Every 2 Weeks   furosemide 20 MG tablet Commonly known as: LASIX TAKE 1 TABLET BY MOUTH TWICE DAILY What changed:   how much to take  when to take this   gabapentin 400 MG capsule Commonly known as: NEURONTIN Take 1 capsule (400 mg total) by mouth 3 (three) times daily.   glucose blood test strip Commonly known as: FREESTYLE LITE USE AS INSTRUCTED TO CHECK BLOOD SUGAR  3 TIMES DAILY. DX:E11.65   HumuLIN R U-500 KwikPen 500 UNIT/ML kwikpen Generic drug: insulin regular human CONCENTRATED Inject 45 Units into the skin 3 (three) times daily with meals.   Iron 325 (65 Fe) MG Tabs Take 325 mg by mouth daily.   Januvia 50 MG tablet Generic drug: sitaGLIPtin Take 50 mg by mouth daily.   lamoTRIgine 100 MG tablet Commonly known as: LAMICTAL Take 100 mg by mouth daily.   metFORMIN 500 MG 24 hr tablet Commonly known as: GLUCOPHAGE-XR TAKE 2 TABLETS IN THE MORNING AND 2 TABLETS AT NIGHT. What changed:   how much to take  how to take this  when to take this  additional instructions   metoprolol  succinate 25 MG 24 hr tablet Commonly known as: TOPROL-XL Take 1 tablet (25 mg total) by mouth daily.   NovoLIN N ReliOn 100 UNIT/ML injection Generic drug: insulin NPH Human INJECT 60 UNITS AM and 50 units PM SUBCUTANEOUSLY TWICE DAILY FOR 30 DAYS What changed:   how much to take  how to take this  when to take this  additional instructions   pantoprazole 40 MG tablet Commonly known as: PROTONIX Take 1 tablet (40 mg total) by mouth daily.   QUEtiapine 100 MG tablet Commonly known as: SEROQUEL Take 1 tablet (100 mg total) by mouth at bedtime.   Skin Tac Adhesive Barrier Wipe Misc 1 each by Does not apply route daily.       Allergies:  Allergies  Allergen Reactions  . Ativan [Lorazepam] Anaphylaxis       . Codeine Itching and Other (See Comments)    hallucinations  . Demerol [Meperidine] Anaphylaxis  . Keflex [Cephalexin] Shortness Of Breath and Rash    Tremors  . Lithium Nausea And Vomiting and Other (See Comments)    Can not keep this medication down. It makes her terribly ill.  . Doxycycline Other (See Comments)    Severe muscle tremor  . Oxycodone Other (See Comments)    Other reaction(s): OTHER Abnormal behavior  . Darvocet [Propoxyphene N-Acetaminophen] Itching  . Latex Itching    Other reaction(s): OTHER  . Propoxyphene Itching    Past Medical History, Surgical history, Social history, and Family History were reviewed and updated.  Review of Systems: All other 10 point review of systems is negative.   Physical Exam:  height is 5' 1.02" (1.55 m) and weight is 226 lb 1.9 oz (102.6 kg). Her oral temperature is 99 F (37.2 C). Her blood pressure is 131/76 and her pulse is 88. Her respiration is 19 and oxygen saturation is 100%.   Wt Readings from Last 3 Encounters:  01/18/20 226 lb 1.9 oz (102.6 kg)  11/03/19 217 lb (98.4 kg)  09/27/19 221 lb 5.5 oz (100.4 kg)    Ocular: Sclerae unicteric, pupils equal, round and reactive to  light Ear-nose-throat: Oropharynx clear, dentition fair Lymphatic: No cervical or supraclavicular adenopathy Lungs no rales or rhonchi, good excursion bilaterally Heart regular rate and rhythm, no murmur appreciated Abd soft, nontender, positive bowel sounds, no liver or spleen tip palpated on exam, no fluid wave  MSK no focal spinal tenderness, no joint edema Neuro: non-focal, well-oriented, appropriate affect Breasts: Deferred   Lab Results  Component Value Date   WBC 7.0 01/18/2020   HGB 10.1 (L) 01/18/2020   HCT 31.2 (L) 01/18/2020   MCV 100.3 (H) 01/18/2020   PLT 280 01/18/2020   Lab Results  Component Value Date   FERRITIN 482 (H) 09/27/2019  IRON 29 09/27/2019   TIBC 199 (L) 09/27/2019   UIBC 170 09/27/2019   IRONPCTSAT 15 09/27/2019   Lab Results  Component Value Date   RETICCTPCT 2.8 01/18/2020   RBC 3.11 (L) 01/18/2020   RBC 3.10 (L) 01/18/2020   No results found for: KPAFRELGTCHN, LAMBDASER, KAPLAMBRATIO No results found for: IGGSERUM, IGA, IGMSERUM No results found for: Odetta Pink, SPEI   Chemistry      Component Value Date/Time   NA 143 01/18/2020 1106   NA 146 (H) 07/20/2018 1601   NA 140 12/18/2016 1156   K 4.7 01/18/2020 1106   K 4.9 12/18/2016 1156   CL 98 01/18/2020 1106   CL 104 09/18/2016 1052   CO2 31 01/18/2020 1106   CO2 28 12/18/2016 1156   BUN 36 (H) 01/18/2020 1106   BUN 27 07/20/2018 1601   BUN 19.9 12/18/2016 1156   CREATININE 2.20 (H) 01/18/2020 1106   CREATININE 1.40 (H) 10/20/2017 1551   CREATININE 0.8 12/18/2016 1156      Component Value Date/Time   CALCIUM 9.9 01/18/2020 1106   CALCIUM 9.8 12/18/2016 1156   ALKPHOS 85 01/18/2020 1106   ALKPHOS 213 (H) 12/18/2016 1156   AST 28 01/18/2020 1106   AST 14 12/18/2016 1156   ALT 32 01/18/2020 1106   ALT 19 12/18/2016 1156   BILITOT 0.4 01/18/2020 1106   BILITOT 0.39 12/18/2016 1156       Impression and Plan: Ms.  [name] is a very pleasant 64 yo white female with history of iron deficiency anemia and erythropoietin deficiency secondary to uncontrolled diabetes. We will see what her iron studies look like and can replace if needed.  We will plan to see her again in another year.  She promises to contact our office with any questions or concerns. We can certainly see her sooner if needed.   Laverna Peace, NP 9/8/202112:06 PM

## 2020-01-18 NOTE — Telephone Encounter (Signed)
Appointments scheduled calendar printed & mailed per 9/8 los

## 2020-01-19 LAB — IRON AND TIBC
Iron: 58 ug/dL (ref 41–142)
Saturation Ratios: 25 % (ref 21–57)
TIBC: 238 ug/dL (ref 236–444)
UIBC: 179 ug/dL (ref 120–384)

## 2020-01-19 LAB — ERYTHROPOIETIN: Erythropoietin: 29.2 m[IU]/mL — ABNORMAL HIGH (ref 2.6–18.5)

## 2020-01-19 LAB — FERRITIN: Ferritin: 393 ng/mL — ABNORMAL HIGH (ref 11–307)

## 2020-01-24 DIAGNOSIS — E7800 Pure hypercholesterolemia, unspecified: Secondary | ICD-10-CM | POA: Diagnosis not present

## 2020-01-24 DIAGNOSIS — D631 Anemia in chronic kidney disease: Secondary | ICD-10-CM | POA: Diagnosis not present

## 2020-01-24 DIAGNOSIS — E1165 Type 2 diabetes mellitus with hyperglycemia: Secondary | ICD-10-CM | POA: Diagnosis not present

## 2020-01-24 DIAGNOSIS — Z794 Long term (current) use of insulin: Secondary | ICD-10-CM | POA: Diagnosis not present

## 2020-01-24 DIAGNOSIS — E1140 Type 2 diabetes mellitus with diabetic neuropathy, unspecified: Secondary | ICD-10-CM | POA: Diagnosis not present

## 2020-01-27 DIAGNOSIS — F3181 Bipolar II disorder: Secondary | ICD-10-CM | POA: Diagnosis not present

## 2020-01-31 ENCOUNTER — Telehealth: Payer: Self-pay | Admitting: *Deleted

## 2020-01-31 DIAGNOSIS — E1165 Type 2 diabetes mellitus with hyperglycemia: Secondary | ICD-10-CM | POA: Diagnosis not present

## 2020-01-31 DIAGNOSIS — E118 Type 2 diabetes mellitus with unspecified complications: Secondary | ICD-10-CM | POA: Diagnosis not present

## 2020-01-31 DIAGNOSIS — M7989 Other specified soft tissue disorders: Secondary | ICD-10-CM | POA: Diagnosis not present

## 2020-01-31 DIAGNOSIS — E7800 Pure hypercholesterolemia, unspecified: Secondary | ICD-10-CM | POA: Diagnosis not present

## 2020-01-31 DIAGNOSIS — E11319 Type 2 diabetes mellitus with unspecified diabetic retinopathy without macular edema: Secondary | ICD-10-CM | POA: Diagnosis not present

## 2020-01-31 DIAGNOSIS — Z794 Long term (current) use of insulin: Secondary | ICD-10-CM | POA: Diagnosis not present

## 2020-01-31 DIAGNOSIS — Z23 Encounter for immunization: Secondary | ICD-10-CM | POA: Diagnosis not present

## 2020-01-31 DIAGNOSIS — E1140 Type 2 diabetes mellitus with diabetic neuropathy, unspecified: Secondary | ICD-10-CM | POA: Diagnosis not present

## 2020-01-31 DIAGNOSIS — D631 Anemia in chronic kidney disease: Secondary | ICD-10-CM | POA: Diagnosis not present

## 2020-01-31 NOTE — Telephone Encounter (Signed)
Call received from patient concerned that her diabetes MD told her that her Hgb is low and would like to know what she should do.  Informed pt to have her diabetes MD fax her recent lab work to [phone] for review.  Pt states that she will call her diabetes MD now and is appreciative of assistance.

## 2020-02-03 DIAGNOSIS — E113512 Type 2 diabetes mellitus with proliferative diabetic retinopathy with macular edema, left eye: Secondary | ICD-10-CM | POA: Diagnosis not present

## 2020-02-06 ENCOUNTER — Other Ambulatory Visit: Payer: Self-pay

## 2020-02-11 DIAGNOSIS — F3181 Bipolar II disorder: Secondary | ICD-10-CM | POA: Diagnosis not present

## 2020-02-21 DIAGNOSIS — L89623 Pressure ulcer of left heel, stage 3: Secondary | ICD-10-CM | POA: Diagnosis not present

## 2020-02-21 DIAGNOSIS — E11621 Type 2 diabetes mellitus with foot ulcer: Secondary | ICD-10-CM | POA: Diagnosis not present

## 2020-02-21 DIAGNOSIS — L97222 Non-pressure chronic ulcer of left calf with fat layer exposed: Secondary | ICD-10-CM | POA: Diagnosis not present

## 2020-02-21 DIAGNOSIS — E11622 Type 2 diabetes mellitus with other skin ulcer: Secondary | ICD-10-CM | POA: Diagnosis not present

## 2020-02-21 DIAGNOSIS — M7989 Other specified soft tissue disorders: Secondary | ICD-10-CM | POA: Diagnosis not present

## 2020-02-21 DIAGNOSIS — M19072 Primary osteoarthritis, left ankle and foot: Secondary | ICD-10-CM | POA: Diagnosis not present

## 2020-02-21 DIAGNOSIS — L97422 Non-pressure chronic ulcer of left heel and midfoot with fat layer exposed: Secondary | ICD-10-CM | POA: Diagnosis not present

## 2020-02-21 DIAGNOSIS — E1165 Type 2 diabetes mellitus with hyperglycemia: Secondary | ICD-10-CM | POA: Diagnosis not present

## 2020-02-22 DIAGNOSIS — L89620 Pressure ulcer of left heel, unstageable: Secondary | ICD-10-CM | POA: Diagnosis not present

## 2020-02-24 DIAGNOSIS — E113512 Type 2 diabetes mellitus with proliferative diabetic retinopathy with macular edema, left eye: Secondary | ICD-10-CM | POA: Diagnosis not present

## 2020-02-27 DIAGNOSIS — Z794 Long term (current) use of insulin: Secondary | ICD-10-CM | POA: Diagnosis not present

## 2020-02-27 DIAGNOSIS — L89622 Pressure ulcer of left heel, stage 2: Secondary | ICD-10-CM | POA: Diagnosis not present

## 2020-02-27 DIAGNOSIS — E1165 Type 2 diabetes mellitus with hyperglycemia: Secondary | ICD-10-CM | POA: Diagnosis not present

## 2020-02-27 DIAGNOSIS — E11621 Type 2 diabetes mellitus with foot ulcer: Secondary | ICD-10-CM | POA: Diagnosis not present

## 2020-02-27 DIAGNOSIS — L97422 Non-pressure chronic ulcer of left heel and midfoot with fat layer exposed: Secondary | ICD-10-CM | POA: Diagnosis not present

## 2020-02-28 DIAGNOSIS — L89620 Pressure ulcer of left heel, unstageable: Secondary | ICD-10-CM | POA: Diagnosis not present

## 2020-03-05 DIAGNOSIS — E1165 Type 2 diabetes mellitus with hyperglycemia: Secondary | ICD-10-CM | POA: Diagnosis not present

## 2020-03-05 DIAGNOSIS — L89623 Pressure ulcer of left heel, stage 3: Secondary | ICD-10-CM | POA: Diagnosis not present

## 2020-03-05 DIAGNOSIS — L97422 Non-pressure chronic ulcer of left heel and midfoot with fat layer exposed: Secondary | ICD-10-CM | POA: Diagnosis not present

## 2020-03-05 DIAGNOSIS — E11621 Type 2 diabetes mellitus with foot ulcer: Secondary | ICD-10-CM | POA: Diagnosis not present

## 2020-03-06 DIAGNOSIS — L89620 Pressure ulcer of left heel, unstageable: Secondary | ICD-10-CM | POA: Diagnosis not present

## 2020-03-07 DIAGNOSIS — E1165 Type 2 diabetes mellitus with hyperglycemia: Secondary | ICD-10-CM | POA: Diagnosis not present

## 2020-03-07 DIAGNOSIS — D631 Anemia in chronic kidney disease: Secondary | ICD-10-CM | POA: Diagnosis not present

## 2020-03-12 DIAGNOSIS — E11621 Type 2 diabetes mellitus with foot ulcer: Secondary | ICD-10-CM | POA: Diagnosis not present

## 2020-03-12 DIAGNOSIS — E1165 Type 2 diabetes mellitus with hyperglycemia: Secondary | ICD-10-CM | POA: Diagnosis not present

## 2020-03-12 DIAGNOSIS — L97422 Non-pressure chronic ulcer of left heel and midfoot with fat layer exposed: Secondary | ICD-10-CM | POA: Diagnosis not present

## 2020-03-12 DIAGNOSIS — L89623 Pressure ulcer of left heel, stage 3: Secondary | ICD-10-CM | POA: Diagnosis not present

## 2020-03-12 DIAGNOSIS — E1140 Type 2 diabetes mellitus with diabetic neuropathy, unspecified: Secondary | ICD-10-CM | POA: Diagnosis not present

## 2020-03-16 DIAGNOSIS — E113512 Type 2 diabetes mellitus with proliferative diabetic retinopathy with macular edema, left eye: Secondary | ICD-10-CM | POA: Diagnosis not present

## 2020-03-19 DIAGNOSIS — E11621 Type 2 diabetes mellitus with foot ulcer: Secondary | ICD-10-CM | POA: Diagnosis not present

## 2020-03-19 DIAGNOSIS — Z7984 Long term (current) use of oral hypoglycemic drugs: Secondary | ICD-10-CM | POA: Diagnosis not present

## 2020-03-19 DIAGNOSIS — L89620 Pressure ulcer of left heel, unstageable: Secondary | ICD-10-CM | POA: Diagnosis not present

## 2020-03-19 DIAGNOSIS — Z794 Long term (current) use of insulin: Secondary | ICD-10-CM | POA: Diagnosis not present

## 2020-03-19 DIAGNOSIS — L97422 Non-pressure chronic ulcer of left heel and midfoot with fat layer exposed: Secondary | ICD-10-CM | POA: Diagnosis not present

## 2020-03-20 DIAGNOSIS — R600 Localized edema: Secondary | ICD-10-CM | POA: Diagnosis not present

## 2020-03-20 DIAGNOSIS — E1122 Type 2 diabetes mellitus with diabetic chronic kidney disease: Secondary | ICD-10-CM | POA: Diagnosis not present

## 2020-03-20 DIAGNOSIS — D631 Anemia in chronic kidney disease: Secondary | ICD-10-CM | POA: Diagnosis not present

## 2020-03-20 DIAGNOSIS — I8310 Varicose veins of unspecified lower extremity with inflammation: Secondary | ICD-10-CM | POA: Diagnosis not present

## 2020-03-20 DIAGNOSIS — E1140 Type 2 diabetes mellitus with diabetic neuropathy, unspecified: Secondary | ICD-10-CM | POA: Diagnosis not present

## 2020-03-20 DIAGNOSIS — F39 Unspecified mood [affective] disorder: Secondary | ICD-10-CM | POA: Diagnosis not present

## 2020-03-26 DIAGNOSIS — L97422 Non-pressure chronic ulcer of left heel and midfoot with fat layer exposed: Secondary | ICD-10-CM | POA: Diagnosis not present

## 2020-03-26 DIAGNOSIS — E11621 Type 2 diabetes mellitus with foot ulcer: Secondary | ICD-10-CM | POA: Diagnosis not present

## 2020-03-26 DIAGNOSIS — L89623 Pressure ulcer of left heel, stage 3: Secondary | ICD-10-CM | POA: Diagnosis not present

## 2020-03-26 DIAGNOSIS — E1165 Type 2 diabetes mellitus with hyperglycemia: Secondary | ICD-10-CM | POA: Diagnosis not present

## 2020-04-02 ENCOUNTER — Emergency Department (HOSPITAL_BASED_OUTPATIENT_CLINIC_OR_DEPARTMENT_OTHER): Payer: PPO

## 2020-04-02 ENCOUNTER — Encounter (HOSPITAL_BASED_OUTPATIENT_CLINIC_OR_DEPARTMENT_OTHER): Payer: Self-pay

## 2020-04-02 ENCOUNTER — Other Ambulatory Visit: Payer: Self-pay

## 2020-04-02 ENCOUNTER — Inpatient Hospital Stay (HOSPITAL_BASED_OUTPATIENT_CLINIC_OR_DEPARTMENT_OTHER)
Admission: EM | Admit: 2020-04-02 | Discharge: 2020-04-05 | DRG: 393 | Disposition: A | Discharge status: Home / Self Care | Payer: PPO | Attending: Internal Medicine | Admitting: Internal Medicine

## 2020-04-02 ENCOUNTER — Inpatient Hospital Stay (HOSPITAL_COMMUNITY): Payer: PPO

## 2020-04-02 DIAGNOSIS — D62 Acute posthemorrhagic anemia: Secondary | ICD-10-CM | POA: Diagnosis not present

## 2020-04-02 DIAGNOSIS — M86672 Other chronic osteomyelitis, left ankle and foot: Secondary | ICD-10-CM | POA: Diagnosis present

## 2020-04-02 DIAGNOSIS — G9341 Metabolic encephalopathy: Secondary | ICD-10-CM | POA: Diagnosis not present

## 2020-04-02 DIAGNOSIS — N184 Chronic kidney disease, stage 4 (severe): Secondary | ICD-10-CM | POA: Diagnosis not present

## 2020-04-02 DIAGNOSIS — E11621 Type 2 diabetes mellitus with foot ulcer: Secondary | ICD-10-CM | POA: Diagnosis not present

## 2020-04-02 DIAGNOSIS — R160 Hepatomegaly, not elsewhere classified: Secondary | ICD-10-CM | POA: Diagnosis not present

## 2020-04-02 DIAGNOSIS — Z79899 Other long term (current) drug therapy: Secondary | ICD-10-CM

## 2020-04-02 DIAGNOSIS — I129 Hypertensive chronic kidney disease with stage 1 through stage 4 chronic kidney disease, or unspecified chronic kidney disease: Secondary | ICD-10-CM | POA: Diagnosis not present

## 2020-04-02 DIAGNOSIS — R112 Nausea with vomiting, unspecified: Secondary | ICD-10-CM | POA: Diagnosis not present

## 2020-04-02 DIAGNOSIS — F319 Bipolar disorder, unspecified: Secondary | ICD-10-CM | POA: Diagnosis not present

## 2020-04-02 DIAGNOSIS — D508 Other iron deficiency anemias: Secondary | ICD-10-CM | POA: Diagnosis not present

## 2020-04-02 DIAGNOSIS — Z7982 Long term (current) use of aspirin: Secondary | ICD-10-CM | POA: Diagnosis not present

## 2020-04-02 DIAGNOSIS — Z89511 Acquired absence of right leg below knee: Secondary | ICD-10-CM

## 2020-04-02 DIAGNOSIS — B9620 Unspecified Escherichia coli [E. coli] as the cause of diseases classified elsewhere: Secondary | ICD-10-CM | POA: Diagnosis present

## 2020-04-02 DIAGNOSIS — D131 Benign neoplasm of stomach: Secondary | ICD-10-CM | POA: Diagnosis not present

## 2020-04-02 DIAGNOSIS — E785 Hyperlipidemia, unspecified: Secondary | ICD-10-CM | POA: Diagnosis present

## 2020-04-02 DIAGNOSIS — Z8719 Personal history of other diseases of the digestive system: Secondary | ICD-10-CM | POA: Diagnosis not present

## 2020-04-02 DIAGNOSIS — E538 Deficiency of other specified B group vitamins: Secondary | ICD-10-CM | POA: Diagnosis not present

## 2020-04-02 DIAGNOSIS — E1139 Type 2 diabetes mellitus with other diabetic ophthalmic complication: Secondary | ICD-10-CM

## 2020-04-02 DIAGNOSIS — K920 Hematemesis: Secondary | ICD-10-CM | POA: Diagnosis present

## 2020-04-02 DIAGNOSIS — Z20822 Contact with and (suspected) exposure to covid-19: Secondary | ICD-10-CM | POA: Diagnosis not present

## 2020-04-02 DIAGNOSIS — R4182 Altered mental status, unspecified: Secondary | ICD-10-CM | POA: Diagnosis not present

## 2020-04-02 DIAGNOSIS — Z794 Long term (current) use of insulin: Secondary | ICD-10-CM

## 2020-04-02 DIAGNOSIS — D631 Anemia in chronic kidney disease: Secondary | ICD-10-CM | POA: Diagnosis present

## 2020-04-02 DIAGNOSIS — D539 Nutritional anemia, unspecified: Secondary | ICD-10-CM | POA: Diagnosis present

## 2020-04-02 DIAGNOSIS — E1122 Type 2 diabetes mellitus with diabetic chronic kidney disease: Secondary | ICD-10-CM | POA: Diagnosis not present

## 2020-04-02 DIAGNOSIS — S91102A Unspecified open wound of left great toe without damage to nail, initial encounter: Secondary | ICD-10-CM | POA: Diagnosis not present

## 2020-04-02 DIAGNOSIS — A419 Sepsis, unspecified organism: Secondary | ICD-10-CM | POA: Diagnosis not present

## 2020-04-02 DIAGNOSIS — N390 Urinary tract infection, site not specified: Secondary | ICD-10-CM | POA: Diagnosis not present

## 2020-04-02 DIAGNOSIS — M2550 Pain in unspecified joint: Secondary | ICD-10-CM | POA: Diagnosis not present

## 2020-04-02 DIAGNOSIS — E1165 Type 2 diabetes mellitus with hyperglycemia: Secondary | ICD-10-CM | POA: Diagnosis present

## 2020-04-02 DIAGNOSIS — N179 Acute kidney failure, unspecified: Secondary | ICD-10-CM

## 2020-04-02 DIAGNOSIS — E1169 Type 2 diabetes mellitus with other specified complication: Secondary | ICD-10-CM | POA: Diagnosis not present

## 2020-04-02 DIAGNOSIS — Z7401 Bed confinement status: Secondary | ICD-10-CM | POA: Diagnosis not present

## 2020-04-02 DIAGNOSIS — R7989 Other specified abnormal findings of blood chemistry: Secondary | ICD-10-CM | POA: Diagnosis present

## 2020-04-02 DIAGNOSIS — Z90710 Acquired absence of both cervix and uterus: Secondary | ICD-10-CM

## 2020-04-02 DIAGNOSIS — E1140 Type 2 diabetes mellitus with diabetic neuropathy, unspecified: Secondary | ICD-10-CM | POA: Diagnosis present

## 2020-04-02 DIAGNOSIS — L03116 Cellulitis of left lower limb: Secondary | ICD-10-CM | POA: Diagnosis not present

## 2020-04-02 DIAGNOSIS — Z89422 Acquired absence of other left toe(s): Secondary | ICD-10-CM | POA: Diagnosis not present

## 2020-04-02 DIAGNOSIS — K760 Fatty (change of) liver, not elsewhere classified: Secondary | ICD-10-CM | POA: Diagnosis present

## 2020-04-02 DIAGNOSIS — L97429 Non-pressure chronic ulcer of left heel and midfoot with unspecified severity: Secondary | ICD-10-CM | POA: Diagnosis present

## 2020-04-02 DIAGNOSIS — R1084 Generalized abdominal pain: Secondary | ICD-10-CM | POA: Diagnosis not present

## 2020-04-02 DIAGNOSIS — IMO0002 Reserved for concepts with insufficient information to code with codable children: Secondary | ICD-10-CM

## 2020-04-02 DIAGNOSIS — K449 Diaphragmatic hernia without obstruction or gangrene: Secondary | ICD-10-CM | POA: Diagnosis not present

## 2020-04-02 DIAGNOSIS — R5381 Other malaise: Secondary | ICD-10-CM | POA: Diagnosis not present

## 2020-04-02 DIAGNOSIS — I10 Essential (primary) hypertension: Secondary | ICD-10-CM | POA: Diagnosis not present

## 2020-04-02 DIAGNOSIS — Z89412 Acquired absence of left great toe: Secondary | ICD-10-CM

## 2020-04-02 DIAGNOSIS — K317 Polyp of stomach and duodenum: Principal | ICD-10-CM | POA: Diagnosis present

## 2020-04-02 DIAGNOSIS — J90 Pleural effusion, not elsewhere classified: Secondary | ICD-10-CM | POA: Diagnosis not present

## 2020-04-02 DIAGNOSIS — R52 Pain, unspecified: Secondary | ICD-10-CM | POA: Diagnosis not present

## 2020-04-02 DIAGNOSIS — I700 Atherosclerosis of aorta: Secondary | ICD-10-CM | POA: Diagnosis not present

## 2020-04-02 LAB — CBC WITH DIFFERENTIAL/PLATELET
Abs Immature Granulocytes: 0.14 10*3/uL — ABNORMAL HIGH (ref 0.00–0.07)
Basophils Absolute: 0.1 10*3/uL (ref 0.0–0.1)
Basophils Relative: 0 %
Eosinophils Absolute: 0.1 10*3/uL (ref 0.0–0.5)
Eosinophils Relative: 0 %
HCT: 28.9 % — ABNORMAL LOW (ref 36.0–46.0)
Hemoglobin: 9.6 g/dL — ABNORMAL LOW (ref 12.0–15.0)
Immature Granulocytes: 1 %
Lymphocytes Relative: 12 %
Lymphs Abs: 1.5 10*3/uL (ref 0.7–4.0)
MCH: 32.2 pg (ref 26.0–34.0)
MCHC: 33.2 g/dL (ref 30.0–36.0)
MCV: 97 fL (ref 80.0–100.0)
Monocytes Absolute: 0.9 10*3/uL (ref 0.1–1.0)
Monocytes Relative: 7 %
Neutro Abs: 10.2 10*3/uL — ABNORMAL HIGH (ref 1.7–7.7)
Neutrophils Relative %: 80 %
Platelets: 337 10*3/uL (ref 150–400)
RBC: 2.98 MIL/uL — ABNORMAL LOW (ref 3.87–5.11)
RDW: 13.8 % (ref 11.5–15.5)
WBC: 12.8 10*3/uL — ABNORMAL HIGH (ref 4.0–10.5)
nRBC: 0 % (ref 0.0–0.2)

## 2020-04-02 LAB — TYPE AND SCREEN
ABO/RH(D): B POS
Antibody Screen: NEGATIVE

## 2020-04-02 LAB — LACTIC ACID, PLASMA
Lactic Acid, Venous: 1.8 mmol/L (ref 0.5–1.9)
Lactic Acid, Venous: 1.7 mmol/L (ref 0.5–1.9)

## 2020-04-02 LAB — URINALYSIS, ROUTINE W REFLEX MICROSCOPIC
Bilirubin Urine: NEGATIVE
Glucose, UA: >=500 mg/dL — AB
Ketones, ur: NEGATIVE mg/dL
Nitrite: NEGATIVE
Protein, ur: 100 mg/dL — AB
Specific Gravity, Urine: 1.02 (ref 1.005–1.030)
pH: 6 (ref 5.0–8.0)

## 2020-04-02 LAB — RESP PANEL BY RT-PCR (FLU A&B, COVID) ARPGX2
Influenza A by PCR: NEGATIVE
Influenza B by PCR: NEGATIVE
SARS Coronavirus 2 by RT PCR: NEGATIVE

## 2020-04-02 LAB — COMPREHENSIVE METABOLIC PANEL
ALT: 24 U/L (ref 0–44)
AST: 17 U/L (ref 15–41)
Albumin: 3.5 g/dL (ref 3.5–5.0)
Alkaline Phosphatase: 92 U/L (ref 38–126)
Anion gap: 12 (ref 5–15)
BUN: 48 mg/dL — ABNORMAL HIGH (ref 8–23)
CO2: 26 mmol/L (ref 22–32)
Calcium: 8.8 mg/dL — ABNORMAL LOW (ref 8.9–10.3)
Chloride: 96 mmol/L — ABNORMAL LOW (ref 98–111)
Creatinine, Ser: 2.29 mg/dL — ABNORMAL HIGH (ref 0.44–1.00)
GFR, Estimated: 23 mL/min — ABNORMAL LOW (ref >60)
Glucose, Bld: 516 mg/dL (ref 70–99)
Potassium: 4.2 mmol/L (ref 3.5–5.1)
Sodium: 134 mmol/L — ABNORMAL LOW (ref 135–145)
Total Bilirubin: 0.7 mg/dL (ref 0.3–1.2)
Total Protein: 7.8 g/dL (ref 6.5–8.1)

## 2020-04-02 LAB — CBG MONITORING, ED
Glucose-Capillary: 500 mg/dL — ABNORMAL HIGH (ref 70–99)
Glucose-Capillary: 461 mg/dL — ABNORMAL HIGH (ref 70–99)
Glucose-Capillary: 455 mg/dL — ABNORMAL HIGH (ref 70–99)

## 2020-04-02 LAB — LIPASE, BLOOD: Lipase: 19 U/L (ref 11–51)

## 2020-04-02 LAB — URINALYSIS, MICROSCOPIC (REFLEX): WBC, UA: >50 WBC/hpf (ref 0–5)

## 2020-04-02 LAB — CBC
HCT: 28.5 % — ABNORMAL LOW (ref 36.0–46.0)
Hemoglobin: 9.3 g/dL — ABNORMAL LOW (ref 12.0–15.0)
MCH: 32.7 pg (ref 26.0–34.0)
MCHC: 32.6 g/dL (ref 30.0–36.0)
MCV: 100.4 fL — ABNORMAL HIGH (ref 80.0–100.0)
Platelets: 311 10*3/uL (ref 150–400)
RBC: 2.84 MIL/uL — ABNORMAL LOW (ref 3.87–5.11)
RDW: 13.7 % (ref 11.5–15.5)
WBC: 12.6 10*3/uL — ABNORMAL HIGH (ref 4.0–10.5)
nRBC: 0 % (ref 0.0–0.2)

## 2020-04-02 LAB — HEMOGLOBIN A1C
Hgb A1c MFr Bld: 6.9 % — ABNORMAL HIGH (ref 4.8–5.6)
Mean Plasma Glucose: 151.33 mg/dL

## 2020-04-02 LAB — PROTIME-INR
INR: 1.1 (ref 0.8–1.2)
Prothrombin Time: 14.2 seconds (ref 11.4–15.2)

## 2020-04-02 LAB — CREATININE, SERUM
Creatinine, Ser: 2.12 mg/dL — ABNORMAL HIGH (ref 0.44–1.00)
GFR, Estimated: 26 mL/min — ABNORMAL LOW (ref >60)

## 2020-04-02 LAB — GLUCOSE, CAPILLARY
Glucose-Capillary: 491 mg/dL — ABNORMAL HIGH (ref 70–99)
Glucose-Capillary: 450 mg/dL — ABNORMAL HIGH (ref 70–99)
Glucose-Capillary: 387 mg/dL — ABNORMAL HIGH (ref 70–99)

## 2020-04-02 LAB — APTT: aPTT: 28 seconds (ref 24–36)

## 2020-04-02 MED ORDER — ONDANSETRON HCL 4 MG/2ML IJ SOLN
4.0000 mg | Freq: Four times a day (QID) | INTRAMUSCULAR | Status: DC | PRN
  Administered 2020-04-02 – 2020-04-04 (×5): 4 mg via INTRAVENOUS
  Filled 2020-04-02 (×5): qty 2

## 2020-04-02 MED ORDER — INSULIN NPH (HUMAN) (ISOPHANE) 100 UNIT/ML ~~LOC~~ SUSP
20.0000 [IU] | Freq: Two times a day (BID) | SUBCUTANEOUS | Status: DC
  Administered 2020-04-02: 20 [IU] via SUBCUTANEOUS
  Filled 2020-04-02 (×2): qty 10

## 2020-04-02 MED ORDER — SODIUM CHLORIDE 0.9 % IV SOLN: Freq: Once | INTRAVENOUS | Status: AC

## 2020-04-02 MED ORDER — SODIUM CHLORIDE 0.9 % IV BOLUS
1000.0000 mL | Freq: Once | INTRAVENOUS | Status: AC
  Administered 2020-04-02: 1000 mL via INTRAVENOUS

## 2020-04-02 MED ORDER — INSULIN REGULAR HUMAN 100 UNIT/ML IJ SOLN
8.0000 [IU] | Freq: Once | INTRAMUSCULAR | Status: DC
  Filled 2020-04-02: qty 3

## 2020-04-02 MED ORDER — ONDANSETRON HCL 4 MG PO TABS: 4.0000 mg | ORAL_TABLET | Freq: Four times a day (QID) | ORAL | Status: DC | PRN

## 2020-04-02 MED ORDER — HEPARIN SODIUM (PORCINE) 5000 UNIT/ML IJ SOLN
5000.0000 [IU] | Freq: Three times a day (TID) | INTRAMUSCULAR | Status: DC
  Administered 2020-04-02 – 2020-04-05 (×8): 5000 [IU] via SUBCUTANEOUS
  Filled 2020-04-02 (×8): qty 1

## 2020-04-02 MED ORDER — SODIUM CHLORIDE 0.9 % IV SOLN
2.0000 g | Freq: Once | INTRAVENOUS | Status: AC
  Administered 2020-04-02: 2 g via INTRAVENOUS
  Filled 2020-04-02: qty 20

## 2020-04-02 MED ORDER — SODIUM CHLORIDE 0.9 % IV SOLN
1.0000 g | INTRAVENOUS | Status: DC
  Administered 2020-04-03 – 2020-04-04 (×2): 1 g via INTRAVENOUS
  Filled 2020-04-02: qty 10
  Filled 2020-04-02: qty 1

## 2020-04-02 MED ORDER — INSULIN ASPART 100 UNIT/ML ~~LOC~~ SOLN
SUBCUTANEOUS | Status: AC
  Administered 2020-04-02: 8 [IU] via INTRAVENOUS
  Filled 2020-04-02: qty 8

## 2020-04-02 MED ORDER — INSULIN ASPART 100 UNIT/ML IV SOLN
8.0000 [IU] | Freq: Once | INTRAVENOUS | Status: AC
  Filled 2020-04-02: qty 0.08

## 2020-04-02 MED ORDER — INSULIN ASPART 100 UNIT/ML ~~LOC~~ SOLN
0.0000 [IU] | Freq: Every day | SUBCUTANEOUS | Status: DC
  Administered 2020-04-02: 5 [IU] via SUBCUTANEOUS
  Administered 2020-04-03: 2 [IU] via SUBCUTANEOUS
  Administered 2020-04-04: 5 [IU] via SUBCUTANEOUS

## 2020-04-02 MED ORDER — PANTOPRAZOLE SODIUM 40 MG IV SOLR
40.0000 mg | Freq: Two times a day (BID) | INTRAVENOUS | Status: DC
  Administered 2020-04-02: 40 mg via INTRAVENOUS
  Filled 2020-04-02: qty 40

## 2020-04-02 MED ORDER — INSULIN ASPART 100 UNIT/ML ~~LOC~~ SOLN
0.0000 [IU] | Freq: Three times a day (TID) | SUBCUTANEOUS | Status: DC
  Administered 2020-04-02: 20 [IU] via SUBCUTANEOUS
  Administered 2020-04-03: 15 [IU] via SUBCUTANEOUS
  Administered 2020-04-03: 11 [IU] via SUBCUTANEOUS
  Administered 2020-04-03: 20 [IU] via SUBCUTANEOUS
  Administered 2020-04-04: 15 [IU] via SUBCUTANEOUS
  Administered 2020-04-04: 4 [IU] via SUBCUTANEOUS
  Administered 2020-04-04: 15 [IU] via SUBCUTANEOUS
  Administered 2020-04-05 (×2): 7 [IU] via SUBCUTANEOUS
  Administered 2020-04-05: 4 [IU] via SUBCUTANEOUS

## 2020-04-02 NOTE — Progress Notes (Signed)
Md notified of blood sugar 450. MD instructed this RN to give 2200 dose of NPH now and give max amount of novolog according to sliding scale

## 2020-04-02 NOTE — ED Notes (Signed)
Pt aware of urine needed 

## 2020-04-02 NOTE — ED Triage Notes (Signed)
Pt arrives complaining of vomiting since yesterday. Unable to keep food down. Pt diabetic, has wound to left foot that sister says she has not been cleaning, sees wound care. CBG noted to be 500 during triage

## 2020-04-02 NOTE — ED Notes (Signed)
Report called to [facility] with [facility]

## 2020-04-02 NOTE — Progress Notes (Signed)
Patient with 4 episodes of coffee ground emesis in the last hour.

## 2020-04-02 NOTE — ED Provider Notes (Signed)
[facility] EMERGENCY DEPARTMENT Provider Note   CSN: [id number] Arrival date & time: 04/02/20  7782     History Chief Complaint  Patient presents with  . Emesis    Dana [name] is a 64 y.o. female who presents with concern for 4 days of anorexia, fatigue, nausea, now with nonbloody nonbilious emesis. At my initial interview the patient is fairly somnolent, unable to provide much history, she states she has been "feeling bad since Friday".   Patient with history of poorly controlled diabetes mellitus type 2.  Patient with right BKA, left heel wounds, for which she follows with wound care.  She was seen last on 03/26/2020; according to their note, patient with 2 areas of partial-thickness ulcers to the left heel.  Wounds were improving, they have become notably smaller, without sign of infection on 11/15.  Chronic lymphedema of the left leg.  Patient's sister is at the bedside, whom she lives with.  Sister states that the patient has been sleeping throughout most of the day each day since Friday, is arousable but has hardly spoken at all since Friday.  She states that the patient has not taken any of her insulin since Friday, and has not eaten anything except for a few bites of a tomato sandwich last night.  Patient reports that her blood sugars when she is taking her insulin are usually between 50 and 100, states that she sometimes feels weak when her sugars are low.  According the patient's sister, she threw up once yesterday, and then again this morning each time she tried to drink water.  Additionally patient does reports that the patient is unable to bathe herself, mostly just washes herself down with a rag, and states that her sister very rarely cleans the wound in her left foot.    Patient presented to the emergency department today prompting of her sister, who noted drainage from the wound onto the floor at the house.  Patient denies pain in her left foot, around the  wound.  Patient endorses nausea and vomiting, but denies fevers, chills, abdominal pain, diarrhea.  She denies chest pain, shortness of breath, palpitations.  She endorses significant fatigue.  Denies dysuria, urgency, but endorses urinary frequency  I personally reviewed this patient's medical records.  Patient with history of bipolar 1, poorly controlled diabetes mellitus type 2, hyperlipidemia, iron deficiency anemia for which he follows with hematology, GERD, right BKA, partial amputation of left foot (great toe).  She is able to ambulate independently with a right leg prosthesis; according to patient's sister she has frequent falls, most recent 2 weeks ago.   HPI     Past Medical History:  Diagnosis Date  . Bipolar 1 disorder ([place])   . Cellulitis and abscess of foot 03/08/2015  . Diabetes mellitus    INSULIN DEPENDENT  . Diabetic neuropathy ([place])   . 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 H/O hiatal hernia   . Hyperlipidemia   . Hypertension    past hx of  . Iron malabsorption 09/26/2015  . Numbness and tingling    Hx; of in B/LLE and B/LUE  . Other iron deficiency anemias 09/20/2015    Patient Active Problem List   Diagnosis Date Noted  . AMS (altered mental status) 04/02/2020  . Severe sepsis ([place]) 09/27/2019  . Acute respiratory failure with hypoxia ([place]) 09/27/2019  . Emesis 09/27/2019  . Hyperglycemia 09/27/2019  . Sepsis ([place]) 09/27/2019  . Toxic metabolic encephalopathy   . Coffee ground  emesis   . Hyperkalemia   . Pneumonia 09/26/2019  . Partial nontraumatic amputation of left foot ([place]) 04/28/2017  . Acquired absence of right leg below knee ([place]) 04/14/2017  . Pleural effusion 01/19/2017  . Contracture of left Achilles tendon 01/16/2017  . Allergic rhinitis 01/03/2017  . Gastroesophageal reflux disease without esophagitis 01/03/2017  . Hiatal hernia 02/03/2016  . Anemia associated with diabetes mellitus ([place]) 11/07/2015  . Iron malabsorption  09/26/2015  . Other iron deficiency anemia 09/20/2015  . 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 Diabetic retinopathy associated with diabetes mellitus due to underlying condition ([place]) 03/15/2015  . DM (diabetes mellitus) type II uncontrolled with eye manifestation ([place]) 11/08/2014  . Hyperlipidemia LDL goal <100 11/08/2014  . Diabetic polyneuropathy associated with type 2 diabetes mellitus ([place]) 11/08/2014  . Partial nontraumatic amputation of foot ([place]) 11/08/2014  . Type 2 diabetes mellitus without complication, with long-term current use of insulin ([place]) 10/24/2011  . Bipolar 1 disorder ([place]) 10/24/2011    Past Surgical History:  Procedure Laterality Date  . ABDOMINAL HYSTERECTOMY    . ADENOIDECTOMY     Hx: of  . AMPUTATION  10/24/2011   Procedure: AMPUTATION RAY;  Surgeon: Newt Minion, MD;  Location: [facility];  Service: Orthopedics;  Laterality: Right;  Right Foot 3rd Ray Amputation  . AMPUTATION Right 12/16/2012   Procedure: Right Foot Transmetatarsal Amputation;  Surgeon: Newt Minion, MD;  Location: [facility];  Service: Orthopedics;  Laterality: Right;  . AMPUTATION Left 03/09/2015   Procedure: LEFT FOOT 1ST RAY AMPUTATION;  Surgeon: Newt Minion, MD;  Location: [facility];  Service: Orthopedics;  Laterality: Left;  . BLADDER SURGERY     x 2, tacked 1st time; mesh "eroded", had to be removed  . Bladder Tact   2002  . BREAST SURGERY Left    "knot removed"  . CARPAL TUNNEL RELEASE Left   . COLON SURGERY    . EYE SURGERY  11/17/2017  . NASAL SEPTUM SURGERY  1976  . TONSILLECTOMY     age 65's     OB History   No obstetric history on file.     Family History  Problem Relation Age of Onset  . Diabetes Mother   . Mental illness Mother   . Bipolar disorder Mother   . Hypertension Father   . Osteoarthritis Father   . Heart disease Father   . Diabetes Sister   . Early death Brother        MVA  . Healthy Sister   . Diabetes Maternal Grandmother     Social History    Tobacco Use  . Smoking status: Never Smoker  . Smokeless tobacco: Never Used  Vaping Use  . Vaping Use: Never used  Substance Use Topics  . Alcohol use: No    Alcohol/week: 0.0 standard drinks  . Drug use: No    Home Medications Prior to Admission medications   Medication Sig Start Date End Date Taking? Authorizing Provider  aspirin 81 MG EC tablet Take 81 mg by mouth daily. Swallow whole.   Yes [provider]  atorvastatin (LIPITOR) 20 MG tablet Take 1 tablet (20 mg total) by mouth at bedtime. 07/20/18  Yes Scot Jun, FNP  busPIRone (BUSPAR) 7.5 MG tablet Take 1 tablet (7.5 mg total) by mouth 2 (two) times daily. 10/02/19  Yes Hosie Poisson, MD  Ferrous Sulfate (IRON) 325 (65 Fe) MG TABS Take 325 mg by mouth daily.    Yes [provider]  furosemide (LASIX) 20 MG tablet TAKE 1 TABLET BY MOUTH TWICE DAILY Patient taking differently: Take 40 mg by mouth 2 (two) times daily.  02/24/18  Yes Brunetta Jeans, PA-C  gabapentin (NEURONTIN) 400 MG capsule Take 1 capsule (400 mg total) by mouth 3 (three) times daily. 10/02/19  Yes Hosie Poisson, MD  HUMULIN R U-500 KWIKPEN 500 UNIT/ML kwikpen Inject 45 Units into the skin 3 (three) times daily with meals.  07/31/19  Yes [provider]  JANUVIA 50 MG tablet Take 50 mg by mouth daily. 07/07/19  Yes [provider]  lamoTRIgine (LAMICTAL) 100 MG tablet Take 100 mg by mouth daily. 09/15/19  Yes [provider]  metFORMIN (GLUCOPHAGE-XR) 500 MG 24 hr tablet TAKE 2 TABLETS IN THE MORNING AND 2 TABLETS AT NIGHT. Patient taking differently: Take 1,000 mg by mouth 2 (two) times daily.  12/09/17  Yes Elayne Snare, MD  Multiple Vitamins-Minerals (CENTRUM ADULTS PO) Take 1 tablet by mouth daily.   Yes [provider]  NOVOLIN N RELION 100 UNIT/ML injection INJECT 60 UNITS AM and 50 units PM SUBCUTANEOUSLY TWICE DAILY FOR 30 DAYS Patient taking differently: Inject 75 Units into the skin 3 (three)  times daily.  07/20/18  Yes Scot Jun, FNP  pantoprazole (PROTONIX) 40 MG tablet Take 1 tablet (40 mg total) by mouth daily. 07/20/18  Yes Scot Jun, FNP  QUEtiapine (SEROQUEL) 100 MG tablet Take 1 tablet (100 mg total) by mouth at bedtime. 10/02/19  Yes Hosie Poisson, MD  APOAEQUORIN PO Take 1 tablet by mouth daily.    [provider]  BD VEO INSULIN SYRINGE U/F 31G X 15/64" 1 ML MISC Administer insulin up 3 times per day per sliding scale E.11.9 07/20/18   Scot Jun, FNP  Continuous Blood Gluc Sensor (FREESTYLE LIBRE 14 DAY SENSOR) MISC SMARTSIG:1 Each Topical Every 2 Weeks 07/17/19   [provider]  glucose blood (FREESTYLE LITE) test strip USE AS INSTRUCTED TO CHECK BLOOD SUGAR 3 TIMES DAILY. DX:E11.65 07/20/18   Scot Jun, FNP  Lancets (FREESTYLE) lancets Use as instructed to check blood sugar 3 times per day dx code E11.39 07/20/18   Scot Jun, FNP  metoprolol succinate (TOPROL-XL) 25 MG 24 hr tablet Take 1 tablet (25 mg total) by mouth daily. 10/03/19   Hosie Poisson, MD  Ostomy Supplies (SKIN TAC ADHESIVE BARRIER WIPE) MISC 1 each by Does not apply route daily. 11/25/17   Elayne Snare, MD    Allergies    Ativan [lorazepam], Codeine, Demerol [meperidine], Keflex [cephalexin], Lithium, Doxycycline, Oxycodone, Darvocet [propoxyphene n-acetaminophen], Latex, and Propoxyphene  Review of Systems   Review of Systems  Constitutional: Positive for activity change, appetite change and fatigue. Negative for chills and fever.  HENT: Negative.   Eyes: Negative.   Respiratory: Negative for chest tightness, shortness of breath and wheezing.   Cardiovascular: Negative for chest pain, palpitations and leg swelling.  Gastrointestinal: Positive for nausea and vomiting. Negative for anal bleeding and diarrhea.  Genitourinary: Positive for frequency. Negative for dysuria, hematuria and urgency.  Skin: Positive for wound.       Left heal, partial  thickness pressure ulcer x 2  Allergic/Immunologic: Positive for immunocompromised state.       DMT2  Neurological: Positive for weakness. Negative for dizziness, syncope, facial asymmetry, light-headedness, numbness and headaches.  Hematological: Negative.   Psychiatric/Behavioral: Positive for confusion.    Physical Exam Updated Vital Signs BP (!) 161/112   Pulse  86   Temp 98.8 F (37.1 C) (Oral)   Resp 13   Ht 5\' 1"  (1.549 m)   Wt 98.9 kg   SpO2 100%   BMI 41.20 kg/m   Physical Exam Vitals and nursing note reviewed.  Constitutional:      Appearance: She is obese.     Comments: Poor personal hygeine  HENT:     Head: Normocephalic and atraumatic.     Nose: Nose normal.     Mouth/Throat:     Mouth: Mucous membranes are moist.     Dentition: Has dentures.     Pharynx: No oropharyngeal exudate or posterior oropharyngeal erythema.     Comments: Tongue is red in color, as from candy or colored drink Eyes:     General:        Right eye: No discharge.        Left eye: No discharge.     Conjunctiva/sclera: Conjunctivae normal.     Pupils: Pupils are equal, round, and reactive to light.  Neck:     Trachea: Trachea and phonation normal.  Cardiovascular:     Rate and Rhythm: Normal rate and regular rhythm.     Pulses: Normal pulses.          Radial pulses are 2+ on the right side and 2+ on the left side.       Right dorsalis pedis pulse not accessible.     Heart sounds: Normal heart sounds. No murmur heard.   Pulmonary:     Effort: Pulmonary effort is normal. No respiratory distress.     Breath sounds: Normal breath sounds. No wheezing or rales.  Chest:     Chest wall: No lacerations, deformity, swelling, tenderness or crepitus.  Abdominal:     General: Bowel sounds are normal. There is no distension.     Palpations: Abdomen is soft.     Tenderness: There is no abdominal tenderness.    Musculoskeletal:        General: No deformity.     Cervical back: Neck supple.  No rigidity.     Left lower leg: 2+ Edema present.     Left ankle: Swelling present.     Left foot: Swelling and Charcot foot present.       Feet:     Right Lower Extremity: Right leg is amputated below knee.  Feet:     Left foot:     Skin integrity: Ulcer, skin breakdown, erythema and warmth present.     Toenail Condition: Left toenails are abnormally thick.  Lymphadenopathy:     Cervical: No cervical adenopathy.  Skin:    General: Skin is warm and dry.     Capillary Refill: Capillary refill takes less than 2 seconds.  Neurological:     Mental Status: She is easily aroused. She is confused.     Motor: No weakness.     Comments: Patient somnolent at time of my exam, with eyes closed even when she does respond to questions. Patient will follow commands.   Psychiatric:        Mood and Affect: Mood normal.     ED Results / Procedures / Treatments   Labs (all labs ordered are listed, but only abnormal results are displayed) Labs Reviewed  COMPREHENSIVE METABOLIC PANEL - Abnormal; Notable for the following components:      Result Value   Sodium 134 (*)    Chloride 96 (*)    Glucose, Bld 516 (*)    BUN  48 (*)    Creatinine, Ser 2.29 (*)    Calcium 8.8 (*)    GFR, Estimated 23 (*)    All other components within normal limits  CBC WITH DIFFERENTIAL/PLATELET - Abnormal; Notable for the following components:   WBC 12.8 (*)    RBC 2.98 (*)    Hemoglobin 9.6 (*)    HCT 28.9 (*)    Neutro Abs 10.2 (*)    Abs Immature Granulocytes 0.14 (*)    All other components within normal limits  URINALYSIS, ROUTINE W REFLEX MICROSCOPIC - Abnormal; Notable for the following components:   APPearance CLOUDY (*)    Glucose, UA >=500 (*)    Hgb urine dipstick SMALL (*)    Protein, ur 100 (*)    Leukocytes,Ua TRACE (*)    All other components within normal limits  URINALYSIS, MICROSCOPIC (REFLEX) - Abnormal; Notable for the following components:   Bacteria, UA MANY (*)    All other  components within normal limits  CBG MONITORING, ED - Abnormal; Notable for the following components:   Glucose-Capillary 500 (*)    All other components within normal limits  CBG MONITORING, ED - Abnormal; Notable for the following components:   Glucose-Capillary 461 (*)    All other components within normal limits  CBG MONITORING, ED - Abnormal; Notable for the following components:   Glucose-Capillary 455 (*)    All other components within normal limits  RESP PANEL BY RT-PCR (FLU A&B, COVID) ARPGX2  URINE CULTURE  CULTURE, BLOOD (ROUTINE X 2)  CULTURE, BLOOD (ROUTINE X 2)  LACTIC ACID, PLASMA  LACTIC ACID, PLASMA  PROTIME-INR  APTT  LIPASE, BLOOD    EKG EKG Interpretation  Date/Time:  Monday April 02 2020 10:34:14 EST Ventricular Rate:  80 PR Interval:    QRS Duration: 145 QT Interval:  429 QTC Calculation: 495 R Axis:   -47 Text Interpretation: Sinus rhythm RBBB and LAFB Left ventricular hypertrophy rate decreased from prior Confirmed by Davonna Belling [phone]) on 04/02/2020 10:39:16 AM   Radiology DG Chest Port 1 View  Result Date: 04/02/2020 CLINICAL DATA:  Questionable sepsis - evaluate for abnormality EXAM: PORTABLE CHEST 1 VIEW COMPARISON:  10/02/2019 chest radiograph and prior. FINDINGS: Mild hypoinflation. No focal consolidation, pneumothorax or pleural effusion. Cardiomediastinal silhouette within normal limits. No acute osseous abnormality. IMPRESSION: No focal consolidation. Electronically Signed   By: Primitivo Gauze M.D.   On: 04/02/2020 11:11   DG Foot Complete Left  Result Date: 04/02/2020 CLINICAL DATA:  Diabetic with foot wound. EXAM: LEFT FOOT - COMPLETE 3+ VIEW COMPARISON:  02/21/2020 FINDINGS: Similar appearance. Previous amputation of the great toe. Chronic subluxation at the metatarsal phalangeal joint of the second toe. Widespread regional soft tissue swelling with periosteal thickening the metatarsals. Chronic osteomyelitis not excluded.  No destructive osseous change. Chronic flatfoot. IMPRESSION: Previous amputation of the great toe. Widespread regional soft tissue swelling with periosteal thickening the metatarsals. Chronic osteomyelitis not excluded, particularly of the second metatarsal. No destructive osseous change seen. Chronic dislocation at the second metatarsal phalangeal joint. No change since the previous radiographs five weeks ago. Electronically Signed   By: Nelson Chimes M.D.   On: 04/02/2020 11:19    Procedures Procedures (including critical care time)  Medications Ordered in ED Medications  cefTRIAXone (ROCEPHIN) 2 g in sodium chloride 0.9 % 100 mL IVPB (2 g Intravenous New Bag/Given 04/02/20 1318)  insulin aspart (novoLOG) injection 8 Units (has no administration in time range)  sodium chloride 0.9 %  bolus 1,000 mL (0 mLs Intravenous Stopped 04/02/20 1337)    ED Course  I have reviewed the triage vital signs and the nursing notes.  Pertinent labs & imaging results that were available during my care of the patient were reviewed by me and considered in my medical decision making (see chart for details).  Clinical Course as of Apr 02 1337  Mon Apr 02, 2020  1219 RBC(!): 2.98 [RS]    Clinical Course User Index [RS] Rigel Filsinger, Sharlene Dory   MDM Rules/Calculators/A&P                         64 year old female with poorly controlled diabetes mellitus type 2 who presents with 4 days of altered mental status, anorexia, nausea, now with vomiting.  Pressure ulcer x2 to left heel.  The differential diagnosis for AMS is extensive and includes, but is not limited to:  Marland Kitchen Drug overdose - opioids, alcohol, sedatives, antipsychotics, drug withdrawal, others . Metabolic: hypoxia, hypoglycemia, hyperglycemia, hypercalcemia, hypernatremia, hyponatremia, uremia, hepatic encephalopathy, hypothyroidism, hyperthyroidism, vitamin B12 or thiamine deficiency, carbon monoxide poisoning, Wilson's disease, Lactic acidosis,  DKA/HHOS . Infectious: meningitis, encephalitis, bacteremia/sepsis, urinary tract infection, pneumonia, osteomyelitis . Structural: Space-occupying lesion, (brain tumor, subdural hematoma, hydrocephalus,) . Vascular: stroke, subarachnoid hemorrhage, coronary ischemia, hypertensive encephalopathy, CNS vasculitis, thrombotic thrombocytopenic purpura, disseminated intravascular coagulation, hyperviscosity . Psychiatric: Schizophrenia, depression; Other: Seizure, hypothermia, heat stroke, ICU psychosis, dementia -"sundowning."  Given the patient's history, physical exam findings, I am concerned inherently for metabolic derangement, UTI, or osteomyelitis of left heel at site of pressure ulcer.    Hypertensive on intake 166/59, vital signs otherwise normal.  Physical exam concerning for somnolent patient, unable to provide history, as well as known left heel wound wounds in diabetic patient.  Sepsis order set utilized, code sepsis not called this time.  EKG with sinus rhythm, right bundle branch block and LAFB; rate decreased from prior.  No STEMI.  CBC with leukocytosis 12.8, hemoglobin 9.6, previosuly 10.1.  CMP creatinine 2.29, previously 2.2.  Without elevated anion gap.  Lactic acid normal, 1.8.  Fluid bolus ordered.  UA with small Hbg, +leukocytes, many bacteria.   Chest x-ray without acute cardiopulmonary disease.  X-ray of the left foot with previous amputation of the great toe.  Widespread regional soft tissue swelling periosteal thickening to the metatarsals.  Chronic osteomyelitis not excluded, particularly the second metatarsal.  No destructive osseous changes seen.  Chronic dislocation of the second metatarsophalangeal joint.  No change since previous radiograph 5 weeks ago.  Given altered mental status, hyperglycemia, leukocytosis, likely UTI recommend proceeding with admission to the hospital.  Discussed this plan with the patient and her sister at the bedside, they are  amenable to admission.  Insulin, antibiotics ordered to cover for UTI.  Consult placed to hospitalist, Dr. Marylyn Ishihara, who is agreeable to admitting this patient to his service. Patient will likely go to telemetry bed at [facility]. Appreciate his collaboration in the care of this patient.    Final Clinical Impression(s) / ED Diagnoses Final diagnoses:  None    Rx / DC Orders ED Discharge Orders    None       Aura Dials 04/02/20 1338    Davonna Belling, MD 04/02/20 1458

## 2020-04-02 NOTE — Progress Notes (Signed)
Patient arrives from [facility] at this time

## 2020-04-02 NOTE — H&P (Addendum)
History and Physical    Dana [name]:[id number] DOB: 1956/01/15 DOA: 04/02/2020  PCP: [name], DO  Patient coming from: [facility]  Chief Complaint: N/V  HPI: Dana [name] is a 64 y.o. female with medical history significant of DM, bipolar, HLD. Presenting with N/V, lethargy. She reports that he N/V started yesterday. It happened any time she tried to eat or drink. She did not try any medications to help. Her N/V continued through this morning, when she was taken to the ED. She denies any fever, diarrhea, constipation. She does note that he urine seems more concentrated and smelly.   Of note, she recently had a cat scratch/bite that she states hasn't been examined by anyone. She states that the lacerations on her LLE are from where a dog and cat were fighting around her a week ago. The surrounding erythema has increased over the last few days.     ED Course: In the ED, her glucose was found to be 500. She was given fluids. She was found to have a slight leukocytosis. XR of foot showed chronic osteomyelitis. She was started on rocephin for presumed UTI. TRH was called for admission.   Review of Systems:  Denies CP, palpitations, HA, syncopal episodes. Review of systems is otherwise negative for all not mentioned in HPI.   PMHx Past Medical History:  Diagnosis Date  . Bipolar 1 disorder ([place])   . Cellulitis and abscess of foot 03/08/2015  . Diabetes mellitus    INSULIN DEPENDENT  . Diabetic neuropathy ([place])   . 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 H/O hiatal hernia   . Hyperlipidemia   . Hypertension    past hx of  . Iron malabsorption 09/26/2015  . Numbness and tingling    Hx; of in B/LLE and B/LUE  . Other iron deficiency anemias 09/20/2015    PSHx Past Surgical History:  Procedure Laterality Date  . ABDOMINAL HYSTERECTOMY    . ADENOIDECTOMY     Hx: of  . AMPUTATION  10/24/2011   Procedure: AMPUTATION RAY;  Surgeon: Newt Minion, MD;  Location: [place];   Service: Orthopedics;  Laterality: Right;  Right Foot 3rd Ray Amputation  . AMPUTATION Right 12/16/2012   Procedure: Right Foot Transmetatarsal Amputation;  Surgeon: Newt Minion, MD;  Location: [place];  Service: Orthopedics;  Laterality: Right;  . AMPUTATION Left 03/09/2015   Procedure: LEFT FOOT 1ST RAY AMPUTATION;  Surgeon: Newt Minion, MD;  Location: [place];  Service: Orthopedics;  Laterality: Left;  . BLADDER SURGERY     x 2, tacked 1st time; mesh "eroded", had to be removed  . Bladder Tact   2002  . BREAST SURGERY Left    "knot removed"  . CARPAL TUNNEL RELEASE Left   . COLON SURGERY    . EYE SURGERY  11/17/2017  . NASAL SEPTUM SURGERY  1976  . TONSILLECTOMY     age 12's    SocHx  reports that she has never smoked. She has never used smokeless tobacco. She reports that she does not drink alcohol and does not use drugs.  Allergies  Allergen Reactions  . Ativan [Lorazepam] Anaphylaxis       . Codeine Itching and Other (See Comments)    hallucinations  . Demerol [Meperidine] Anaphylaxis  . Keflex [Cephalexin] Shortness Of Breath and Rash    Tremors  . Lithium Nausea And Vomiting and Other (See Comments)    Can not keep this medication down. It makes her  terribly ill.  . Doxycycline Other (See Comments)    Severe muscle tremor  . Oxycodone Other (See Comments)    Other reaction(s): OTHER Abnormal behavior  . Darvocet [Propoxyphene N-Acetaminophen] Itching  . Latex Itching    Other reaction(s): OTHER  . Propoxyphene Itching    FamHx Family History  Problem Relation Age of Onset  . Diabetes Mother   . Mental illness Mother   . Bipolar disorder Mother   . Hypertension Father   . Osteoarthritis Father   . Heart disease Father   . Diabetes Sister   . Early death Brother        MVA  . Healthy Sister   . Diabetes Maternal Grandmother     Prior to Admission medications   Medication Sig Start Date End Date Taking? Authorizing Provider  aspirin 81 MG EC tablet  Take 81 mg by mouth daily. Swallow whole.   Yes [provider]  atorvastatin (LIPITOR) 20 MG tablet Take 1 tablet (20 mg total) by mouth at bedtime. 07/20/18  Yes Scot Jun, FNP  busPIRone (BUSPAR) 7.5 MG tablet Take 1 tablet (7.5 mg total) by mouth 2 (two) times daily. 10/02/19  Yes Hosie Poisson, MD  Ferrous Sulfate (IRON) 325 (65 Fe) MG TABS Take 325 mg by mouth daily.    Yes [provider]  furosemide (LASIX) 20 MG tablet TAKE 1 TABLET BY MOUTH TWICE DAILY Patient taking differently: Take 40 mg by mouth 2 (two) times daily.  02/24/18  Yes Brunetta Jeans, PA-C  gabapentin (NEURONTIN) 400 MG capsule Take 1 capsule (400 mg total) by mouth 3 (three) times daily. 10/02/19  Yes Hosie Poisson, MD  HUMULIN R U-500 KWIKPEN 500 UNIT/ML kwikpen Inject 45 Units into the skin 3 (three) times daily with meals.  07/31/19  Yes [provider]  JANUVIA 50 MG tablet Take 50 mg by mouth daily. 07/07/19  Yes [provider]  lamoTRIgine (LAMICTAL) 100 MG tablet Take 100 mg by mouth daily. 09/15/19  Yes [provider]  metFORMIN (GLUCOPHAGE-XR) 500 MG 24 hr tablet TAKE 2 TABLETS IN THE MORNING AND 2 TABLETS AT NIGHT. Patient taking differently: Take 1,000 mg by mouth 2 (two) times daily.  12/09/17  Yes Elayne Snare, MD  Multiple Vitamins-Minerals (CENTRUM ADULTS PO) Take 1 tablet by mouth daily.   Yes [provider]  NOVOLIN N RELION 100 UNIT/ML injection INJECT 60 UNITS AM and 50 units PM SUBCUTANEOUSLY TWICE DAILY FOR 30 DAYS Patient taking differently: Inject 75 Units into the skin 3 (three) times daily.  07/20/18  Yes Scot Jun, FNP  pantoprazole (PROTONIX) 40 MG tablet Take 1 tablet (40 mg total) by mouth daily. 07/20/18  Yes Scot Jun, FNP  QUEtiapine (SEROQUEL) 100 MG tablet Take 1 tablet (100 mg total) by mouth at bedtime. 10/02/19  Yes Hosie Poisson, MD  APOAEQUORIN PO Take 1 tablet by mouth daily.    [provider]  BD  VEO INSULIN SYRINGE U/F 31G X 15/64" 1 ML MISC Administer insulin up 3 times per day per sliding scale E.11.9 07/20/18   Scot Jun, FNP  Continuous Blood Gluc Sensor (FREESTYLE LIBRE 14 DAY SENSOR) MISC SMARTSIG:1 Each Topical Every 2 Weeks 07/17/19   [provider]  glucose blood (FREESTYLE LITE) test strip USE AS INSTRUCTED TO CHECK BLOOD SUGAR 3 TIMES DAILY. DX:E11.65 07/20/18   Scot Jun, FNP  Lancets (FREESTYLE) lancets Use as instructed to check blood sugar 3 times per day  dx code E11.39 07/20/18   Scot Jun, FNP  metoprolol succinate (TOPROL-XL) 25 MG 24 hr tablet Take 1 tablet (25 mg total) by mouth daily. 10/03/19   Hosie Poisson, MD  Ostomy Supplies (SKIN TAC ADHESIVE BARRIER WIPE) MISC 1 each by Does not apply route daily. 11/25/17   Elayne Snare, MD    Physical Exam: Vitals:   04/02/20 1300 04/02/20 1330 04/02/20 1400 04/02/20 1502  BP: (!) 161/112 (!) 154/72 (!) 150/70 (!) 157/57  Pulse: 86 84 86 79  Resp: 13 (!) 26 16 16   Temp:   98 F (36.7 C) 98.4 F (36.9 C)  TempSrc:   Oral Oral  SpO2: 100% 99% 96% 97%  Weight:      Height:        General: 64 y.o. female resting in bed in NAD Eyes: PERRL, normal sclera ENMT: Nares patent w/o discharge, orophaynx clear, dentition normal, ears w/o discharge/lesions/ulcers Neck: Supple, trachea midline Cardiovascular: RRR, +S1, S2, no m/g/r, equal pulses throughout Respiratory: CTABL, no w/r/r, normal WOB GI: BS+, NDNT, no masses noted, no organomegaly noted MSK: No c/c; RBKA (prosthetic in place), LLE w/ scabbed over lacerations with surrounding area of erythema and warmth Skin: No rashes, bruises, ulcerations noted Neuro: A&O x 3, no focal deficits Psyc: Appropriate interaction and affect, calm/cooperative  Labs on Admission: I have personally reviewed following labs and imaging studies  CBC: Recent Labs  Lab 04/02/20 0957  WBC 12.8*  NEUTROABS 10.2*  HGB 9.6*  HCT 28.9*  MCV 97.0  PLT 376    Basic Metabolic Panel: Recent Labs  Lab 04/02/20 0957  NA 134*  K 4.2  CL 96*  CO2 26  GLUCOSE 516*  BUN 48*  CREATININE 2.29*  CALCIUM 8.8*   GFR: Estimated Creatinine Clearance: 27.1 mL/min (A) (by C-G formula based on SCr of 2.29 mg/dL (H)). Liver Function Tests: Recent Labs  Lab 04/02/20 0957  AST 17  ALT 24  ALKPHOS 92  BILITOT 0.7  PROT 7.8  ALBUMIN 3.5   Recent Labs  Lab 04/02/20 0957  LIPASE 19   No results for input(s): AMMONIA in the last 168 hours. Coagulation Profile: Recent Labs  Lab 04/02/20 0957  INR 1.1   Cardiac Enzymes: No results for input(s): CKTOTAL, CKMB, CKMBINDEX, TROPONINI in the last 168 hours. BNP (last 3 results) No results for input(s): PROBNP in the last 8760 hours. HbA1C: No results for input(s): HGBA1C in the last 72 hours. CBG: Recent Labs  Lab 04/02/20 0936 04/02/20 1040 04/02/20 1300 04/02/20 1515  GLUCAP 500* 461* 455* 491*   Lipid Profile: No results for input(s): CHOL, HDL, LDLCALC, TRIG, CHOLHDL, LDLDIRECT in the last 72 hours. Thyroid Function Tests: No results for input(s): TSH, T4TOTAL, FREET4, T3FREE, THYROIDAB in the last 72 hours. Anemia Panel: No results for input(s): VITAMINB12, FOLATE, FERRITIN, TIBC, IRON, RETICCTPCT in the last 72 hours. Urine analysis:    Component Value Date/Time   COLORURINE YELLOW 04/02/2020 1241   APPEARANCEUR CLOUDY (A) 04/02/2020 1241   LABSPEC 1.020 04/02/2020 1241   PHURINE 6.0 04/02/2020 1241   GLUCOSEU >=500 (A) 04/02/2020 1241   GLUCOSEU NEGATIVE 10/01/2016 1531   HGBUR SMALL (A) 04/02/2020 1241   BILIRUBINUR NEGATIVE 04/02/2020 1241   BILIRUBINUR negative 07/20/2018 1607   BILIRUBINUR neg 02/12/2016 1350   KETONESUR NEGATIVE 04/02/2020 1241   PROTEINUR 100 (A) 04/02/2020 1241   UROBILINOGEN 1.0 07/20/2018 1607   UROBILINOGEN 0.2 10/01/2016 1531   NITRITE NEGATIVE 04/02/2020 1241   LEUKOCYTESUR  TRACE (A) 04/02/2020 1241    Radiological Exams on  Admission: DG Chest Port 1 View  Result Date: 04/02/2020 CLINICAL DATA:  Questionable sepsis - evaluate for abnormality EXAM: PORTABLE CHEST 1 VIEW COMPARISON:  10/02/2019 chest radiograph and prior. FINDINGS: Mild hypoinflation. No focal consolidation, pneumothorax or pleural effusion. Cardiomediastinal silhouette within normal limits. No acute osseous abnormality. IMPRESSION: No focal consolidation. Electronically Signed   By: Primitivo Gauze M.D.   On: 04/02/2020 11:11   DG Foot Complete Left  Result Date: 04/02/2020 CLINICAL DATA:  Diabetic with foot wound. EXAM: LEFT FOOT - COMPLETE 3+ VIEW COMPARISON:  02/21/2020 FINDINGS: Similar appearance. Previous amputation of the great toe. Chronic subluxation at the metatarsal phalangeal joint of the second toe. Widespread regional soft tissue swelling with periosteal thickening the metatarsals. Chronic osteomyelitis not excluded. No destructive osseous change. Chronic flatfoot. IMPRESSION: Previous amputation of the great toe. Widespread regional soft tissue swelling with periosteal thickening the metatarsals. Chronic osteomyelitis not excluded, particularly of the second metatarsal. No destructive osseous change seen. Chronic dislocation at the second metatarsal phalangeal joint. No change since the previous radiographs five weeks ago. Electronically Signed   By: Nelson Chimes M.D.   On: 04/02/2020 11:19    EKG: Independently reviewed. Sinus, no ST elevation  Assessment/Plan N/V     - admit to inpatient, telemetry     - fluids, anti-emetics     - CLD  Uncontrolled DM w/ hyperglycemia     - non-ketotic     - fluids, insulin     - SSI, CLD, glucose checks, A1c  ?cellulitis     - slight leukocytosis; the area is erythematous and warm to touch     - she was already given rocephin in ED for a presumed UTI; can continue that for cellulitis for now  HTN     - resume home meds once med Hx complete  CKD4     - she is at baseline; watch  nephrotoxins  Normocytic anemia     - no evidence of bleed     - check iron studies  ?UTI     - UA is dirty; she was started on rocephin in ED; can continue for now  Bipolar     - resume home meds when med history complete  LLE Chronic heal wound     - non-purulent     - followed at wound center     - will ask [facility] to follow here  DVT prophylaxis: lovenox  Code Status: FULL  Family Communication: None at bedside  Consults called: None  Status is: Inpatient  Remains inpatient appropriate because:ongoing N/V   Dispo: The patient is from: Home              Anticipated d/c is to: Home              Anticipated d/c date is: 2 days              Patient currently is not medically stable to d/c.  Jonnie Finner DO [facility]  If 7PM-7AM, please contact night-coverage [email or web address]  04/02/2020, 3:30 PM

## 2020-04-02 NOTE — ED Notes (Signed)
Report called Jayme Cloud RN

## 2020-04-02 NOTE — Significant Event (Signed)
Rapid Response Event Note   Reason for Call :  Called to bedside dt patient having episodes of coffee ground emesis and decrease LOC. Initial Focused Assessment:  Neuro: Oriented x4, lethargic  Cardiac: BP 161/74. HR NSR in the 80s Resp: Breath sounds clear, RR 20/min  Interventions:  Lab at bedside to draw stat CBC, H and H, and type and screen  Plan of Care:   Bedside RN reports Dr. Marylyn Ishihara is aware of vomiting.   MD ordered stat abd CT and changed diet to NPO. STAT IV team consult ordered dt loss of IV access  Event Summary:   Hgb resulted at 9.3  MD Notified: Marylyn Ishihara Call Time: [place] Time: [id number] End Time: [place]  Jerene Pitch, RN BSN

## 2020-04-03 ENCOUNTER — Inpatient Hospital Stay (HOSPITAL_COMMUNITY): Payer: PPO

## 2020-04-03 ENCOUNTER — Inpatient Hospital Stay (HOSPITAL_COMMUNITY): Payer: PPO | Admitting: Certified Registered Nurse Anesthetist

## 2020-04-03 ENCOUNTER — Encounter (HOSPITAL_COMMUNITY): Payer: Self-pay | Admitting: Internal Medicine

## 2020-04-03 ENCOUNTER — Encounter (HOSPITAL_COMMUNITY)
Admission: EM | Disposition: A | Discharge status: Home / Self Care | Payer: Self-pay | Source: Home / Self Care | Attending: Internal Medicine

## 2020-04-03 DIAGNOSIS — R112 Nausea with vomiting, unspecified: Secondary | ICD-10-CM

## 2020-04-03 DIAGNOSIS — G9341 Metabolic encephalopathy: Secondary | ICD-10-CM

## 2020-04-03 DIAGNOSIS — D131 Benign neoplasm of stomach: Secondary | ICD-10-CM | POA: Diagnosis not present

## 2020-04-03 DIAGNOSIS — K920 Hematemesis: Secondary | ICD-10-CM

## 2020-04-03 DIAGNOSIS — R1084 Generalized abdominal pain: Secondary | ICD-10-CM | POA: Diagnosis not present

## 2020-04-03 HISTORY — PX: POLYPECTOMY: SHX5525

## 2020-04-03 HISTORY — PX: ESOPHAGOGASTRODUODENOSCOPY (EGD) WITH PROPOFOL: SHX5813

## 2020-04-03 LAB — COMPREHENSIVE METABOLIC PANEL
ALT: 21 U/L (ref 0–44)
AST: 16 U/L (ref 15–41)
Albumin: 3.2 g/dL — ABNORMAL LOW (ref 3.5–5.0)
Alkaline Phosphatase: 86 U/L (ref 38–126)
Anion gap: 13 (ref 5–15)
BUN: 46 mg/dL — ABNORMAL HIGH (ref 8–23)
CO2: 25 mmol/L (ref 22–32)
Calcium: 8.4 mg/dL — ABNORMAL LOW (ref 8.9–10.3)
Chloride: 102 mmol/L (ref 98–111)
Creatinine, Ser: 1.95 mg/dL — ABNORMAL HIGH (ref 0.44–1.00)
GFR, Estimated: 28 mL/min — ABNORMAL LOW (ref >60)
Glucose, Bld: 371 mg/dL — ABNORMAL HIGH (ref 70–99)
Potassium: 3.7 mmol/L (ref 3.5–5.1)
Sodium: 140 mmol/L (ref 135–145)
Total Bilirubin: 0.6 mg/dL (ref 0.3–1.2)
Total Protein: 7.5 g/dL (ref 6.5–8.1)

## 2020-04-03 LAB — CBC WITH DIFFERENTIAL/PLATELET
Abs Immature Granulocytes: 0.13 10*3/uL — ABNORMAL HIGH (ref 0.00–0.07)
Basophils Absolute: 0 10*3/uL (ref 0.0–0.1)
Basophils Relative: 0 %
Eosinophils Absolute: 0.1 10*3/uL (ref 0.0–0.5)
Eosinophils Relative: 1 %
HCT: 26.6 % — ABNORMAL LOW (ref 36.0–46.0)
Hemoglobin: 8.6 g/dL — ABNORMAL LOW (ref 12.0–15.0)
Immature Granulocytes: 1 %
Lymphocytes Relative: 13 %
Lymphs Abs: 1.7 10*3/uL (ref 0.7–4.0)
MCH: 32.5 pg (ref 26.0–34.0)
MCHC: 32.3 g/dL (ref 30.0–36.0)
MCV: 100.4 fL — ABNORMAL HIGH (ref 80.0–100.0)
Monocytes Absolute: 1.1 10*3/uL — ABNORMAL HIGH (ref 0.1–1.0)
Monocytes Relative: 8 %
Neutro Abs: 10.1 10*3/uL — ABNORMAL HIGH (ref 1.7–7.7)
Neutrophils Relative %: 77 %
Platelets: 328 10*3/uL (ref 150–400)
RBC: 2.65 MIL/uL — ABNORMAL LOW (ref 3.87–5.11)
RDW: 14 % (ref 11.5–15.5)
WBC: 13.1 10*3/uL — ABNORMAL HIGH (ref 4.0–10.5)
nRBC: 0 % (ref 0.0–0.2)

## 2020-04-03 LAB — GLUCOSE, CAPILLARY
Glucose-Capillary: 400 mg/dL — ABNORMAL HIGH (ref 70–99)
Glucose-Capillary: 395 mg/dL — ABNORMAL HIGH (ref 70–99)
Glucose-Capillary: 401 mg/dL — ABNORMAL HIGH (ref 70–99)
Glucose-Capillary: 322 mg/dL — ABNORMAL HIGH (ref 70–99)
Glucose-Capillary: 264 mg/dL — ABNORMAL HIGH (ref 70–99)
Glucose-Capillary: 234 mg/dL — ABNORMAL HIGH (ref 70–99)

## 2020-04-03 LAB — VITAMIN B12: Vitamin B-12: 260 pg/mL (ref 180–914)

## 2020-04-03 LAB — IRON AND TIBC
Iron: 36 ug/dL (ref 28–170)
Saturation Ratios: 16 % (ref 10.4–31.8)
TIBC: 225 ug/dL — ABNORMAL LOW (ref 250–450)
UIBC: 189 ug/dL

## 2020-04-03 LAB — FERRITIN: Ferritin: 504 ng/mL — ABNORMAL HIGH (ref 11–307)

## 2020-04-03 SURGERY — ESOPHAGOGASTRODUODENOSCOPY (EGD) WITH PROPOFOL
Anesthesia: Monitor Anesthesia Care

## 2020-04-03 MED ORDER — SODIUM CHLORIDE 0.9 % IV SOLN
500.0000 mg | INTRAVENOUS | Status: DC
  Administered 2020-04-03 – 2020-04-04 (×2): 500 mg via INTRAVENOUS
  Filled 2020-04-03 (×2): qty 500

## 2020-04-03 MED ORDER — METOCLOPRAMIDE HCL 5 MG/ML IJ SOLN
5.0000 mg | Freq: Three times a day (TID) | INTRAMUSCULAR | Status: DC
  Administered 2020-04-03 – 2020-04-05 (×7): 5 mg via INTRAVENOUS
  Filled 2020-04-03 (×7): qty 2

## 2020-04-03 MED ORDER — INSULIN NPH (HUMAN) (ISOPHANE) 100 UNIT/ML ~~LOC~~ SUSP
50.0000 [IU] | Freq: Three times a day (TID) | SUBCUTANEOUS | Status: DC
  Administered 2020-04-03 – 2020-04-04 (×4): 50 [IU] via SUBCUTANEOUS
  Filled 2020-04-03: qty 10

## 2020-04-03 MED ORDER — INSULIN ASPART 100 UNIT/ML ~~LOC~~ SOLN: 10.0000 [IU] | Freq: Once | SUBCUTANEOUS | Status: DC

## 2020-04-03 MED ORDER — BENZTROPINE MESYLATE 0.5 MG PO TABS
1.0000 mg | ORAL_TABLET | Freq: Every day | ORAL | Status: DC
  Administered 2020-04-04 – 2020-04-05 (×2): 1 mg via ORAL
  Filled 2020-04-03 (×2): qty 2

## 2020-04-03 MED ORDER — PANTOPRAZOLE SODIUM 40 MG PO TBEC
40.0000 mg | DELAYED_RELEASE_TABLET | Freq: Every day | ORAL | Status: DC
  Administered 2020-04-03 – 2020-04-05 (×3): 40 mg via ORAL
  Filled 2020-04-03 (×3): qty 1

## 2020-04-03 MED ORDER — PROPOFOL 10 MG/ML IV BOLUS
INTRAVENOUS | Status: DC | PRN
  Administered 2020-04-03: 30 mg via INTRAVENOUS

## 2020-04-03 MED ORDER — QUETIAPINE FUMARATE 100 MG PO TABS
100.0000 mg | ORAL_TABLET | Freq: Every day | ORAL | Status: DC
  Administered 2020-04-03 – 2020-04-04 (×2): 100 mg via ORAL
  Filled 2020-04-03 (×2): qty 1

## 2020-04-03 MED ORDER — INSULIN ASPART 100 UNIT/ML ~~LOC~~ SOLN
10.0000 [IU] | Freq: Once | SUBCUTANEOUS | Status: AC
  Administered 2020-04-03: 10 [IU] via SUBCUTANEOUS

## 2020-04-03 MED ORDER — ACETAMINOPHEN 325 MG PO TABS
650.0000 mg | ORAL_TABLET | ORAL | Status: DC | PRN
  Administered 2020-04-03: 650 mg via ORAL
  Filled 2020-04-03: qty 2

## 2020-04-03 MED ORDER — INSULIN NPH (HUMAN) (ISOPHANE) 100 UNIT/ML ~~LOC~~ SUSP: 50.0000 [IU] | Freq: Two times a day (BID) | SUBCUTANEOUS | Status: DC

## 2020-04-03 MED ORDER — LAMOTRIGINE 100 MG PO TABS
200.0000 mg | ORAL_TABLET | Freq: Every day | ORAL | Status: DC
  Administered 2020-04-03 – 2020-04-05 (×3): 200 mg via ORAL
  Filled 2020-04-03 (×3): qty 2

## 2020-04-03 MED ORDER — SODIUM CHLORIDE 0.9 % IV SOLN: INTRAVENOUS | Status: DC

## 2020-04-03 MED ORDER — INSULIN ASPART 100 UNIT/ML ~~LOC~~ SOLN
10.0000 [IU] | Freq: Once | SUBCUTANEOUS | Status: AC
  Administered 2020-04-03: 10 [IU] via INTRAVENOUS

## 2020-04-03 MED ORDER — INSULIN ASPART 100 UNIT/ML ~~LOC~~ SOLN: 20.0000 [IU] | Freq: Once | SUBCUTANEOUS | Status: DC

## 2020-04-03 MED ORDER — HYDROMORPHONE HCL 1 MG/ML IJ SOLN
1.0000 mg | Freq: Once | INTRAMUSCULAR | Status: AC
  Administered 2020-04-03: 1 mg via INTRAVENOUS
  Filled 2020-04-03: qty 1

## 2020-04-03 MED ORDER — BENZTROPINE MESYLATE 0.5 MG PO TABS
0.5000 mg | ORAL_TABLET | Freq: Every day | ORAL | Status: DC
  Administered 2020-04-03 – 2020-04-04 (×2): 0.5 mg via ORAL
  Filled 2020-04-03 (×2): qty 1

## 2020-04-03 MED ORDER — PROPOFOL 500 MG/50ML IV EMUL
INTRAVENOUS | Status: DC | PRN
  Administered 2020-04-03: 125 ug/kg/min via INTRAVENOUS

## 2020-04-03 MED ORDER — INSULIN ASPART 100 UNIT/ML ~~LOC~~ SOLN
SUBCUTANEOUS | Status: AC
  Filled 2020-04-03: qty 1

## 2020-04-03 MED ORDER — GABAPENTIN 300 MG PO CAPS
600.0000 mg | ORAL_CAPSULE | Freq: Two times a day (BID) | ORAL | Status: DC
  Administered 2020-04-03 – 2020-04-05 (×4): 600 mg via ORAL
  Filled 2020-04-03 (×4): qty 2

## 2020-04-03 MED ORDER — BUSPIRONE HCL 10 MG PO TABS
20.0000 mg | ORAL_TABLET | Freq: Three times a day (TID) | ORAL | Status: DC
  Administered 2020-04-03 – 2020-04-05 (×6): 20 mg via ORAL
  Filled 2020-04-03 (×5): qty 2

## 2020-04-03 MED ORDER — ATORVASTATIN CALCIUM 20 MG PO TABS
20.0000 mg | ORAL_TABLET | Freq: Every day | ORAL | Status: DC
  Administered 2020-04-03 – 2020-04-04 (×2): 20 mg via ORAL
  Filled 2020-04-03 (×2): qty 1

## 2020-04-03 MED ORDER — PROPOFOL 1000 MG/100ML IV EMUL
INTRAVENOUS | Status: AC
  Filled 2020-04-03: qty 100

## 2020-04-03 SURGICAL SUPPLY — 15 items

## 2020-04-03 NOTE — H&P (View-Only) (Signed)
Referring Provider: Dr. Hosie Poisson Primary Care Physician:  [name], DO Primary Gastroenterologist:  Dr. Henrene Pastor   Reason for Consultation:  Nausea, vomiting coffee ground emesis   HPI: Dana [name] is a 64 y.o. female with a past medical history of bipolar disorder, hypertension, hyperlipidemia, fatty liver, DM II on insulin, IDA and erythropoietin deficiency secondary to poorly controlled diabetes followed by hematology, s/p right BKA 2019 with chronic left heel wound followed by the wound care clinic and questionable recent cat scratch to her LLE. She presented to [facility]  ED on 04/02/2020 with fatigue, anorexia, nausea and nonbloody nonbilious emesis.  Labs in the ED showed a Sodium 134.  Potassium 4.2.  BUN 48.  Creatinine 2.29 (baseline Cr 1.12 on 06/28/2019).  Lipase and LFTs were normal.  Lactic acid 1.8.  WBC 12.8.  Hemoglobin 9.6 (baseline Hg variable 12.4 - 10.5).  INR 1.1.  Influenza a and B were negative.  SARS coronavirus 2 negative.  Urine glucose greater than 500.  Ketones were negative.  Trace urine leukocytes.  She received Rocephin for possible UTI.  Chest x-ray without evidence of acute cardiopulmonary disease.  She was transferred to [facility] for admission.  An abdominal/pelvic CT without contrast without evidence of acute intra-abdominal or intrapelvic infectious or inflammatory process.  Hepatomegaly was noted.  She develops hematemesis yesterday evening.  GI consult was requested for further evaluation and EGD.  I spoke the nurse tech, [name], who verified the patient had projectile moderate volume red bloody emesis x1 followed by 2 episodes of coffee ground emesis.  No further vomiting since then.  She is awake this morning is sitting at the side of her bed naked.  She is calm but slightly agitated in no distress.  She is alert and cooperative.  She stated she was in the hospital due to having nausea, vomiting and abdominal pain.  She reports  passing normal bowel movement daily. No rectal bleeding or melena.  No prior history of GI bleed.  She takes aspirin 81 mg daily.  She denies any other NSAID use.  No alcohol use.  No current heartburn or dysphagia.  She reported having an EGD many years ago and further details are unknown.  She drinks 3-5 nondiet Dr. Malachi Bonds daily.  No history of cardiac or pulmonary disease as verified by her sister Dana [name] and son Dana [name].  No known family history of esophageal, gastric or colon cancer.  Her son Dana [name] is her power of attorney phone # [phone].   She was seen by Dr. Henrene Pastor in our outpatient GI clinic in 11/19/2017 due to having diarrhea and dysphagia. An EGD and colonoscopy were scheduled, however, she cancelled these procedures. She underwent a colonoscopy 08/29/1997 at [facility] due secondary to [place] which was normal. A small bowel series was done 4/23/199 which was normal.    CTAP 04/02/2020: 1. No acute intra-abdominal or intrapelvic abnormality on this noncontrast study. 2. Hepatomegaly. 3.  Aortic Atherosclerosis   Past Medical History:  Diagnosis Date  . Bipolar 1 disorder ([place])   . Cellulitis and abscess of foot 03/08/2015  . Diabetes mellitus    INSULIN DEPENDENT  . Diabetic neuropathy ([place])   . 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 H/O hiatal hernia   . Hyperlipidemia   . Hypertension    past hx of  . Iron malabsorption 09/26/2015  . Numbness and tingling    Hx; of in B/LLE and B/LUE  . Other iron deficiency anemias 09/20/2015  Past Surgical History:  Procedure Laterality Date  . ABDOMINAL HYSTERECTOMY    . ADENOIDECTOMY     Hx: of  . AMPUTATION  10/24/2011   Procedure: AMPUTATION RAY;  Surgeon: Newt Minion, MD;  Location: [place];  Service: Orthopedics;  Laterality: Right;  Right Foot 3rd Ray Amputation  . AMPUTATION Right 12/16/2012   Procedure: Right Foot Transmetatarsal Amputation;  Surgeon: Newt Minion, MD;  Location: [place];  Service: Orthopedics;  Laterality: Right;   . AMPUTATION Left 03/09/2015   Procedure: LEFT FOOT 1ST RAY AMPUTATION;  Surgeon: Newt Minion, MD;  Location: [place];  Service: Orthopedics;  Laterality: Left;  . BLADDER SURGERY     x 2, tacked 1st time; mesh "eroded", had to be removed  . Bladder Tact   2002  . BREAST SURGERY Left    "knot removed"  . CARPAL TUNNEL RELEASE Left   . COLON SURGERY    . EYE SURGERY  11/17/2017  . NASAL SEPTUM SURGERY  1976  . TONSILLECTOMY     age 89's    Prior to Admission medications   Medication Sig Start Date End Date Taking? Authorizing Provider  Apoaequorin (PREVAGEN PO) Take 1 capsule by mouth daily.   Yes [provider]  aspirin 81 MG EC tablet Take 81 mg by mouth daily. Swallow whole.   Yes [provider]  atorvastatin (LIPITOR) 20 MG tablet Take 1 tablet (20 mg total) by mouth at bedtime. 07/20/18  Yes Scot Jun, FNP  benztropine (COGENTIN) 1 MG tablet Take 0.5-1 mg by mouth See admin instructions. Takes 1 mg in the night and 0.5 mg at night   Yes [provider]  busPIRone (BUSPAR) 10 MG tablet Take 20 mg by mouth 3 (three) times daily.   Yes [provider]  famotidine (PEPCID) 20 MG tablet Take 20 mg by mouth daily.   Yes [provider]  Ferrous Sulfate (IRON) 325 (65 Fe) MG TABS Take 325 mg by mouth daily.    Yes [provider]  furosemide (LASIX) 40 MG tablet Take 40 mg by mouth 2 (two) times daily.   Yes [provider]  gabapentin (NEURONTIN) 600 MG tablet Take 600 mg by mouth 2 (two) times daily.    Yes [provider]  HUMULIN R U-500 KWIKPEN 500 UNIT/ML kwikpen Inject 30 Units into the skin 3 (three) times daily with meals.  07/31/19  Yes [provider]  lamoTRIgine (LAMICTAL) 200 MG tablet Take 200 mg by mouth daily.   Yes [provider]  metFORMIN (GLUCOPHAGE-XR) 500 MG 24 hr tablet TAKE 2 TABLETS IN THE MORNING AND 2 TABLETS AT NIGHT. Patient taking differently: Take 1,000 mg by  mouth 2 (two) times daily.  12/09/17  Yes Elayne Snare, MD  Multiple Vitamins-Minerals (CENTRUM ADULTS PO) Take 1 tablet by mouth daily.   Yes [provider]  NOVOLIN N RELION 100 UNIT/ML injection INJECT 60 UNITS AM and 50 units PM SUBCUTANEOUSLY TWICE DAILY FOR 30 DAYS Patient taking differently: Inject 75 Units into the skin 3 (three) times daily.  07/20/18  Yes Scot Jun, FNP  pantoprazole (PROTONIX) 40 MG tablet Take 1 tablet (40 mg total) by mouth daily. 07/20/18  Yes Scot Jun, FNP  QUEtiapine (SEROQUEL) 100 MG tablet Take 1 tablet (100 mg total) by mouth at bedtime. 10/02/19  Yes Hosie Poisson, MD  BD VEO INSULIN SYRINGE U/F 31G X 15/64" 1 ML MISC Administer insulin up 3 times  per day per sliding scale E.11.9 07/20/18   Scot Jun, FNP  busPIRone (BUSPAR) 7.5 MG tablet Take 1 tablet (7.5 mg total) by mouth 2 (two) times daily. Patient not taking: Reported on 04/02/2020 10/02/19   Hosie Poisson, MD  Continuous Blood Gluc Sensor (FREESTYLE LIBRE 14 DAY SENSOR) MISC SMARTSIG:1 Each Topical Every 2 Weeks 07/17/19   [provider]  furosemide (LASIX) 20 MG tablet TAKE 1 TABLET BY MOUTH TWICE DAILY Patient not taking: Reported on 04/02/2020 02/24/18   Brunetta Jeans, PA-C  gabapentin (NEURONTIN) 400 MG capsule Take 1 capsule (400 mg total) by mouth 3 (three) times daily. Patient not taking: Reported on 04/02/2020 10/02/19   Hosie Poisson, MD  glucose blood (FREESTYLE LITE) test strip USE AS INSTRUCTED TO CHECK BLOOD SUGAR 3 TIMES DAILY. DX:E11.65 07/20/18   Scot Jun, FNP  Lancets (FREESTYLE) lancets Use as instructed to check blood sugar 3 times per day dx code E11.39 07/20/18   Scot Jun, FNP  metoprolol succinate (TOPROL-XL) 25 MG 24 hr tablet Take 1 tablet (25 mg total) by mouth daily. Patient not taking: Reported on 04/02/2020 10/03/19   Hosie Poisson, MD  Ostomy Supplies (SKIN TAC ADHESIVE BARRIER WIPE) MISC 1 each by Does not apply  route daily. 11/25/17   Elayne Snare, MD    Current Facility-Administered Medications  Medication Dose Route Frequency Provider Last Rate Last Admin  . cefTRIAXone (ROCEPHIN) 1 g in sodium chloride 0.9 % 100 mL IVPB  1 g Intravenous Q24H Kyle, Tyrone A, DO      . heparin injection 5,000 Units  5,000 Units Subcutaneous Q8H Kyle, Tyrone A, DO   5,000 Units at 04/03/20 0508  . insulin aspart (novoLOG) injection 0-20 Units  0-20 Units Subcutaneous TID WC Kyle, Tyrone A, DO   20 Units at 04/02/20 1842  . insulin aspart (novoLOG) injection 0-5 Units  0-5 Units Subcutaneous QHS Kyle, Tyrone A, DO   5 Units at 04/02/20 2300  . insulin NPH Human (NOVOLIN N) injection 20 Units  20 Units Subcutaneous BID AC & HS Kyle, Tyrone A, DO   20 Units at 04/02/20 1845  . ondansetron (ZOFRAN) tablet 4 mg  4 mg Oral Q6H PRN Marylyn Ishihara, Tyrone A, DO       Or  . ondansetron (ZOFRAN) injection 4 mg  4 mg Intravenous Q6H PRN Marylyn Ishihara, Tyrone A, DO   4 mg at 04/03/20 0212  . pantoprazole (PROTONIX) injection 40 mg  40 mg Intravenous Q12H Kyle, Tyrone A, DO   40 mg at 04/02/20 2258    Allergies as of 04/02/2020 - Review Complete 04/02/2020  Allergen Reaction Noted  . Ativan [lorazepam] Anaphylaxis 10/23/2011  . Codeine Itching and Other (See Comments) 01/14/2017  . Demerol [meperidine] Anaphylaxis 10/23/2011  . Keflex [cephalexin] Shortness Of Breath and Rash 12/02/2016  . Lithium Nausea And Vomiting and Other (See Comments) 03/08/2015  . Doxycycline Other (See Comments) 10/14/2016  . Oxycodone Other (See Comments) 10/23/2011  . Darvocet [propoxyphene n-acetaminophen] Itching 10/23/2011  . Latex Itching 10/23/2011  . Propoxyphene Itching 10/23/2011    Family History  Problem Relation Age of Onset  . Diabetes Mother   . Mental illness Mother   . Bipolar disorder Mother   . Hypertension Father   . Osteoarthritis Father   . Heart disease Father   . Diabetes Sister   . Early death Brother        MVA  . Healthy Sister    . Diabetes Maternal  Grandmother     Social History   Socioeconomic History  . Marital status: Married    Spouse name: Not on file  . Number of children: 2  . Years of education: 71  . Highest education level: Not on file  Occupational History    Comment: disabled  Tobacco Use  . Smoking status: Never Smoker  . Smokeless tobacco: Never Used  Vaping Use  . Vaping Use: Never used  Substance and Sexual Activity  . Alcohol use: No    Alcohol/week: 0.0 standard drinks  . Drug use: No  . Sexual activity: Never    Partners: Male  Other Topics Concern  . Not on file  Social History Narrative   Lives with husband in home   Caffeine use - Coke 2 or 3 16 oz daily   Social Determinants of Health   Financial Resource Strain:   . Difficulty of Paying Living Expenses: Not on file  Food Insecurity:   . Worried About Charity fundraiser in the Last Year: Not on file  . Ran Out of Food in the Last Year: Not on file  Transportation Needs:   . Lack of Transportation (Medical): Not on file  . Lack of Transportation (Non-Medical): Not on file  Physical Activity:   . Days of Exercise per Week: Not on file  . Minutes of Exercise per Session: Not on file  Stress:   . Feeling of Stress : Not on file  Social Connections:   . Frequency of Communication with Friends and Family: Not on file  . Frequency of Social Gatherings with Friends and Family: Not on file  . Attends Religious Services: Not on file  . Active Member of Clubs or Organizations: Not on file  . Attends Archivist Meetings: Not on file  . Marital Status: Not on file  Intimate Partner Violence:   . Fear of Current or Ex-Partner: Not on file  . Emotionally Abused: Not on file  . Physically Abused: Not on file  . Sexually Abused: Not on file    Review of Systems: Gen: Denies fever, sweats or chills. No weight loss.  CV: Denies chest pain, palpitations or edema. Resp: Denies cough, shortness of breath of  hemoptysis.  GI: See HPI. GU : Denies urinary burning, blood in urine, increased urinary frequency or incontinence. MS: Denies joint pain, muscles aches or weakness. Derm: Denies rash or lesions.  Chronic left heel wound. Psych: Denies depression or anxiety. Heme: Denies easy bruising, bleeding. Neuro:  Denies headaches, dizziness or paresthesias. Endo:  + Poorly controlled diabetes.  Physical Exam: Vital signs in last 24 hours: Temp:  [98 F (36.7 C)-98.8 F (37.1 C)] 98 F (36.7 C) (11/23 0555) Pulse Rate:  [74-89] 74 (11/23 0555) Resp:  [10-26] 14 (11/23 0555) BP: (136-166)/(57-112) 149/70 (11/23 0555) SpO2:  [89 %-100 %] 99 % (11/23 0555) Weight:  [96.2 kg-98.9 kg] 96.2 kg (11/23 0500)   General:  Alert but somewhat lethargic.  No acute distress. Head:  Normocephalic and atraumatic. Eyes:  No scleral icterus. Conjunctiva pink. Ears:  Normal auditory acuity. Nose:  No deformity, discharge or lesions. Mouth: Absent dentition.  No ulcers or lesions.  Neck:  Supple. No lymphadenopathy or thyromegaly.  Lungs: Sounds clear throughout. Heart: Regular rate and rhythm.  No murmurs. Abdomen: Protuberant, nontender.  Positive bowel sounds to all 4 quadrants.  No obvious HSM. Rectal: Deferred. Musculoskeletal:  Symmetrical without gross deformities.  Pulses:  Normal pulses noted. Extremities: Right below  the knee amputation with prosthesis in use.  Left heel wound. Neurologic:  Alert and  oriented x 3. No focal deficits.  Speech is clear.  She answers questions appropriately. Skin:  Intact without significant lesions or rashes. Psych:  Alert and cooperative, lethargic.   Intake/Output from previous day: 11/22 0701 - 11/23 0700 In: 1100 [IV Piggyback:1100] Out: 450 [Urine:450] Intake/Output this shift: No intake/output data recorded.  Lab Results: Recent Labs    04/02/20 0957 04/02/20 1744 04/03/20 0350  WBC 12.8* 12.6* 13.1*  HGB 9.6* 9.3* 8.6*  HCT 28.9* 28.5* 26.6*   PLT 337 311 328   BMET Recent Labs    04/02/20 0957 04/02/20 1744 04/03/20 0350  NA 134*  --  140  K 4.2  --  3.7  CL 96*  --  102  CO2 26  --  25  GLUCOSE 516*  --  371*  BUN 48*  --  46*  CREATININE 2.29* 2.12* 1.95*  CALCIUM 8.8*  --  8.4*   LFT Recent Labs    04/03/20 0350  PROT 7.5  ALBUMIN 3.2*  AST 16  ALT 21  ALKPHOS 86  BILITOT 0.6   PT/INR Recent Labs    04/02/20 0957  LABPROT 14.2  INR 1.1   Hepatitis Panel No results for input(s): HEPBSAG, HCVAB, HEPAIGM, HEPBIGM in the last 72 hours.    Studies/Results: CT ABDOMEN PELVIS WO CONTRAST  Result Date: 04/02/2020 CLINICAL DATA:  Gastrointestinal bleed. Four episodes of coffee ground emesis. EXAM: CT ABDOMEN AND PELVIS WITHOUT CONTRAST TECHNIQUE: Multidetector CT imaging of the abdomen and pelvis was performed following the standard protocol without IV contrast. COMPARISON:  CT abdomen pelvis 09/26/2019. FINDINGS: Lower chest: Bibasilar linear atelectasis versus scarring. Left lower lobe calcification (2:12). Left anterior descending coronary artery calcifications. No acute abnormality. Hepatobiliary: No focal liver abnormality. Layering hyperdensity within the gallbladder lumen likely represents tiny calcified gallstones. No gallbladder wall thickening or pericholecystic fluid. No biliary dilatation. Pancreas: No focal lesion. Normal pancreatic contour. No surrounding inflammatory changes. No main pancreatic ductal dilatation. Spleen: Normal in size without focal abnormality. Adrenals/Urinary Tract: No adrenal nodule bilaterally. Nonspecific bilateral trace perinephric stranding. No nephrolithiasis, no hydronephrosis, and no contour-deforming renal mass. No ureterolithiasis or hydroureter. The urinary bladder is unremarkable. Stomach/Bowel: Stomach is decompressed and within normal limits. No evidence of bowel wall thickening or dilatation. No pneumatosis. The appendix is not definitely identified.  Vascular/Lymphatic: No abdominal aorta or iliac aneurysm. Mild atherosclerotic plaque of the aorta and its branches. No abdominal, pelvic, or inguinal lymphadenopathy. Reproductive: Status post hysterectomy. No adnexal masses. Other: Trace nonspecific left pericolic gutter fat stranding. Trace nonspecific bilateral perinephric fat stranding. Trace nonspecific pelvic fat stranding. No intraperitoneal free fluid. No intraperitoneal free gas. No organized fluid collection. Musculoskeletal: No abdominal wall hernia or abnormality. No suspicious lytic or blastic osseous lesions. No acute displaced fracture. Multilevel degenerative changes of the spine. IMPRESSION: 1. No acute intra-abdominal or intrapelvic abnormality on this noncontrast study. 2. Hepatomegaly. 3.  Aortic Atherosclerosis (ICD10-I70.0). Electronically Signed   By: Iven Finn M.D.   On: 04/02/2020 22:03   DG Chest Port 1 View  Result Date: 04/02/2020 CLINICAL DATA:  Questionable sepsis - evaluate for abnormality EXAM: PORTABLE CHEST 1 VIEW COMPARISON:  10/02/2019 chest radiograph and prior. FINDINGS: Mild hypoinflation. No focal consolidation, pneumothorax or pleural effusion. Cardiomediastinal silhouette within normal limits. No acute osseous abnormality. IMPRESSION: No focal consolidation. Electronically Signed   By: Primitivo Gauze M.D.   On: 04/02/2020 11:11  DG Foot Complete Left  Result Date: 04/02/2020 CLINICAL DATA:  Diabetic with foot wound. EXAM: LEFT FOOT - COMPLETE 3+ VIEW COMPARISON:  02/21/2020 FINDINGS: Similar appearance. Previous amputation of the great toe. Chronic subluxation at the metatarsal phalangeal joint of the second toe. Widespread regional soft tissue swelling with periosteal thickening the metatarsals. Chronic osteomyelitis not excluded. No destructive osseous change. Chronic flatfoot. IMPRESSION: Previous amputation of the great toe. Widespread regional soft tissue swelling with periosteal thickening the  metatarsals. Chronic osteomyelitis not excluded, particularly of the second metatarsal. No destructive osseous change seen. Chronic dislocation at the second metatarsal phalangeal joint. No change since the previous radiographs five weeks ago. Electronically Signed   By: Nelson Chimes M.D.   On: 04/02/2020 11:19    IMPRESSION/PLAN:  45. 64 year old female with N/V, initially nonbloody emesis x 2 to 3 days then developed red hematemesis x 1 followed by coffee ground emesis x 2 on 04/02/2020. CTAP without contrasts without acute intra-abdominal/pelvic abnormality.  -NPO -EGD benefits and risks discussed including risk with sedation, risk of bleeding, perforation and infection. I spoke to the patient's son Daryl [name] phone # [phone], he is her power of attorney.  I discussed EGD procedure in full detail including the benefits and risks.  He provided verbal consent to proceed with an EGD. -NS IV @ 75 cc/hr -Continue PPI IV twice daily for now -Further recommendations to be determined after EGD completed  2. DM II on insulin, poorly controlled. Glucose today 371.   3. AKI, improving. Cr 1.95  4. Macrocytic anemia. Hg 8.6 (baseline Hg 12). Iron 36. TIBC 225. Vitamin B12 260.   5. Leukocytosis, possible UTI. Chronic left heel wound. WBC 13.1. She is afebrile. On Rocephin.   6. Hepatomegaly, history of hepatic steatosis. Normal LFTs.       Patrecia Pour Kennedy-Smith  04/03/2020, 7:14 AM

## 2020-04-03 NOTE — Interval H&P Note (Signed)
History and Physical Interval Note:  04/03/2020 10:21 AM  Dana [name]  has presented today for surgery, with the diagnosis of hematemesis.  The various methods of treatment have been discussed with the patient and family. After consideration of risks, benefits and other options for treatment, the patient has consented to  Procedure(s): ESOPHAGOGASTRODUODENOSCOPY (EGD) WITH PROPOFOL (N/A) POLYPECTOMY as a surgical intervention.  The patient's history has been reviewed, patient examined, no change in status, stable for surgery.  I have reviewed the patient's chart and labs.  Questions were answered to the patient's satisfaction.     Dominic Pea Stylianos Stradling

## 2020-04-03 NOTE — Progress Notes (Addendum)
Inpatient Diabetes Program Recommendations  AACE/ADA: New Consensus Statement on Inpatient Glycemic Control (2015)  Target Ranges:  Prepandial:   less than 140 mg/dL      Peak postprandial:   less than 180 mg/dL (1-2 hours)      Critically ill patients:  140 - 180 mg/dL   Lab Results  Component Value Date   GLUCAP 400 (H) 04/03/2020   HGBA1C 6.9 (H) 04/02/2020    Review of Glycemic Control Results for [name], Dana B (MRN [id number]) as of 04/03/2020 08:58  Ref. Range 04/02/2020 13:00 04/02/2020 15:15 04/02/2020 18:27 04/02/2020 21:04 04/03/2020 07:55  Glucose-Capillary Latest Ref Range: 70 - 99 mg/dL 455 (H) 491 (H) 450 (H) 387 (H) 400 (H)   Home DM meds: Humulin R U500-30 units tid Novolin N 75 units tid Metformin 1000 mg bid FreeStyle Libre CGM Current orders for Inpatient glycemic control:  Novolin N 20 units bid Novolog 0-20 units tid Novolog 0-5 qhs  Inpatient Diabetes Program Recommendations:     As patient is NPO and blood sugars are > 400 mg/dL please consider,  Novolog 0-20 Q4H Novolin N 50 bid   Spoke with patient at bedside.  She is very drowsy and cannot remember her exact doses of insulins.  Have placed a call to Dr. Almetta Lovely office.  Waiting for call back.    Will continue to follow while inpatient.  Thank you, Dana Dixon, RN, BSN Diabetes Coordinator Inpatient Diabetes Program [phone] (team pager from 8a-5p)

## 2020-04-03 NOTE — Progress Notes (Signed)
Failed attempt at MRI. Rebecca MRI tech extender went to get pt who stated "she would not do this exam and started crying, Pt stated she did not want to do it. Education was provided to pt on importance of exam.

## 2020-04-03 NOTE — Anesthesia Preprocedure Evaluation (Addendum)
Anesthesia Evaluation  Patient identified by MRN, date of birth, ID band Patient awake    Reviewed: Allergy & Precautions, H&P , NPO status , Patient's Chart, lab work & pertinent test results  Airway Mallampati: I  TM Distance: >3 FB Neck ROM: Full    Dental  (+) Edentulous Upper, Edentulous Lower   Pulmonary    breath sounds clear to auscultation       Cardiovascular hypertension, Pt. on medications  Rhythm:Regular Rate:Normal     Neuro/Psych PSYCHIATRIC DISORDERS Bipolar Disorder  Neuromuscular disease    GI/Hepatic hiatal hernia,   Endo/Other  diabetes, Well Controlled, Type 2, Insulin Dependent  Renal/GU      Musculoskeletal   Abdominal   Peds  Hematology  (+) Blood dyscrasia, anemia ,   Anesthesia Other Findings   Reproductive/Obstetrics                            Anesthesia Physical  Anesthesia Plan  ASA: IV  Anesthesia Plan: MAC   Post-op Pain Management:    Induction: Intravenous  PONV Risk Score and Plan: 2  Airway Management Planned: Nasal Cannula, Natural Airway, Simple Face Mask and Mask  Additional Equipment: None  Intra-op Plan:   Post-operative Plan: Extubation in OR  Informed Consent: I have reviewed the patients History and Physical, chart, labs and discussed the procedure including the risks, benefits and alternatives for the proposed anesthesia with the patient or authorized representative who has indicated his/her understanding and acceptance.     Dental advisory given  Plan Discussed with: CRNA, Anesthesiologist and Surgeon  Anesthesia Plan Comments:        Anesthesia Quick Evaluation

## 2020-04-03 NOTE — Transfer of Care (Signed)
Immediate Anesthesia Transfer of Care Note  Patient: Dana [name]  Procedure(s) Performed: ESOPHAGOGASTRODUODENOSCOPY (EGD) WITH PROPOFOL (N/A ) POLYPECTOMY  Patient Location: Endoscopy Unit  Anesthesia Type:MAC  Level of Consciousness: drowsy  Airway & Oxygen Therapy: Patient Spontanous Breathing and Patient connected to face mask  Post-op Assessment: Report given to RN and Post -op Vital signs reviewed and stable  Post vital signs: Reviewed and stable  Last Vitals:  Vitals Value Taken Time  BP    Temp    Pulse    Resp    SpO2      Last Pain:  Vitals:   04/03/20 0929  TempSrc: Oral  PainSc: 0-No pain      Patients Stated Pain Goal: 0 (73/[id number]/87 [id number])  Complications: No complications documented.

## 2020-04-03 NOTE — Anesthesia Postprocedure Evaluation (Signed)
Anesthesia Post Note  Patient: Dana [name]  Procedure(s) Performed: ESOPHAGOGASTRODUODENOSCOPY (EGD) WITH PROPOFOL (N/A ) POLYPECTOMY     Patient location during evaluation: PACU Anesthesia Type: MAC Level of consciousness: awake and alert Pain management: pain level controlled Vital Signs Assessment: post-procedure vital signs reviewed and stable Respiratory status: spontaneous breathing, nonlabored ventilation, respiratory function stable and patient connected to nasal cannula oxygen Cardiovascular status: stable and blood pressure returned to baseline Postop Assessment: no apparent nausea or vomiting Anesthetic complications: no   No complications documented.  Last Vitals:  Vitals:   04/03/20 1050 04/03/20 1052  BP: (!) 150/80   Pulse: 80 83  Resp: (!) 23 18  Temp:    SpO2: 94% 98%    Last Pain:  Vitals:   04/03/20 1025  TempSrc: Axillary  PainSc:                  Chanin Frumkin

## 2020-04-03 NOTE — H&P (Addendum)
PROGRESS NOTE    Dana [name]  TML:[id number] DOB: 1956-01-02 DOA: 04/02/2020 PCP: [name], DO    Chief Complaint  Patient presents with  . Emesis    Brief Narrative:  Dana [name] is a 64 y.o. female with medical history significant of DM, bipolar, HLD admitted to the hospital for persistent nausea, vomiting since yesterday, it followed by  Lethargy, coffee ground emesis.  She was admitted for hyperglycemia from uncontrolled DM, coffee ground emesis, hematemesis, cellulitis of the left lower extremity.  GI consulted , underwent EGD, showed gastric polyps. Meanwhile her left foot x ray showed signs of chronic osteomyelitis, ordered MRi for further evaluation.     Assessment & Plan:   Active Problems:   Encephalopathy, metabolic   AMS (altered mental status)   Generalized abdominal pain   Benign fundic gland polyps of stomach   Coffee ground emesis,  GI bleed ruled out with EGD. GI recommends to continue with PPI daily.  Clear liquid diet started and advance as tolerated.     Uncontrolled DM with hyperglycemia: CBG (last 3)  Recent Labs    04/03/20 0919 04/03/20 0959 04/03/20 1135  GLUCAP 395* 401* 322*   Started her on Novolin N 50 UNITS TIDAC. Continue with SSI.  Get hemoglobin A1c .     Cellulitis of the left lower extremity with chronic left heel wounds.  X rays shows possible chronic osteomyelitis.  MRI of the Left foot ordered.  Continue with IV Rocephin and Zithromax will be added for possible cat scratch disease. Wound care will be consulted.   Abnormal urine analysis Urine cultures ordered and she was started on Rocephin.    History of bipolar disorder Continue with home meds at this time.    Essential hypertension Continue with home meds.    Nausea vomiting, coffee-ground emesis Probably secondary to gastroparesis versus gastroenteritis. Symptomatic management with IV fluids and antiemetics at this time. Continue to  monitor.   Hyperlipidemia Continue with statin.    Anemia of chronic disease No bleeding evident on EGD. Hemoglobin around 8.6. Anemia panel shows adequate iron and B12 levels. Iron supplementation on discharge.    AKI Baseline creatinine appears to be between 1.4-1.7 Patient was admitted with a creatinine of 2.1, creatinine improved with gentle hydration to 1.95. Hold Lasix and other nephrotoxins at this time and repeat renal parameters in the morning.    DVT prophylaxis: scd's Code Status: Full code Family Communication: NONE AT BEDSIDE.  Disposition:   Status is: Inpatient  Remains inpatient appropriate because:Ongoing diagnostic testing needed not appropriate for outpatient work up, Unsafe d/c plan and Inpatient level of care appropriate due to severity of illness   Dispo: The patient is from: Home              Anticipated d/c is to: Home              Anticipated d/c date is: 2 days              Patient currently is not medically stable to d/c.       Consultants:    GASTROENTEROLOGY   Procedures: EGD   Antimicrobials:  None.   Subjective: RN reports that she is inducing to vomit.  No chest pain or sob.   Objective: Vitals:   04/03/20 1037 04/03/20 1040 04/03/20 1050 04/03/20 1052  BP:  128/70 (!) 150/80   Pulse: 80 82 80 83  Resp: 19 (!) 8 (!) 23 18  Temp:  TempSrc:      SpO2: 100% 100% 94% 98%  Weight:      Height:        Intake/Output Summary (Last 24 hours) at 04/03/2020 1244 Last data filed at 04/03/2020 1017 Gross per 24 hour  Intake 1150 ml  Output 450 ml  Net 700 ml   Filed Weights   04/02/20 0937 04/03/20 0500 04/03/20 0929  Weight: 98.9 kg 96.2 kg 96.2 kg    Examination:  General exam: Appears calm and comfortable  Respiratory system: Clear to auscultation. Respiratory effort normal. Cardiovascular system: S1 & S2 heard, RRR. No JVD,No pedal edema. Gastrointestinal system: Abdomen is nondistended, soft and nontender.   Normal bowel sounds heard. Central nervous system: Alert and oriented. No focal neurological deficits. Extremities: Right BKA, left heel wounds Skin: No rashes, lesions or ulcers Psychiatry: l. Mood & affect appropriate.     Data Reviewed: I have personally reviewed following labs and imaging studies  CBC: Recent Labs  Lab 04/02/20 0957 04/02/20 1744 04/03/20 0350  WBC 12.8* 12.6* 13.1*  NEUTROABS 10.2*  --  10.1*  HGB 9.6* 9.3* 8.6*  HCT 28.9* 28.5* 26.6*  MCV 97.0 100.4* 100.4*  PLT 337 311 024    Basic Metabolic Panel: Recent Labs  Lab 04/02/20 0957 04/02/20 1744 04/03/20 0350  NA 134*  --  140  K 4.2  --  3.7  CL 96*  --  102  CO2 26  --  25  GLUCOSE 516*  --  371*  BUN 48*  --  46*  CREATININE 2.29* 2.12* 1.95*  CALCIUM 8.8*  --  8.4*    GFR: Estimated Creatinine Clearance: 31.3 mL/min (A) (by C-G formula based on SCr of 1.95 mg/dL (H)).  Liver Function Tests: Recent Labs  Lab 04/02/20 0957 04/03/20 0350  AST 17 16  ALT 24 21  ALKPHOS 92 86  BILITOT 0.7 0.6  PROT 7.8 7.5  ALBUMIN 3.5 3.2*    CBG: Recent Labs  Lab 04/02/20 2104 04/03/20 0755 04/03/20 0919 04/03/20 0959 04/03/20 1135  GLUCAP 387* 400* 395* 401* 322*     Recent Results (from the past 240 hour(s))  Culture, blood (routine x 2)     Status: None (Preliminary result)   Collection Time: 04/02/20  9:55 AM   Specimen: BLOOD  Result Value Ref Range Status   Specimen Description   Final    BLOOD RIGHT ANTECUBITAL Performed at [facility], [address] [place], [place] [address]    Special Requests   Final    BOTTLES DRAWN AEROBIC AND ANAEROBIC Blood Culture adequate volume Performed at [facility], [address] [place], [place] [address]    Culture   Final    NO GROWTH < 24 HOURS Performed at [facility], [place] [address], [place], [place] [address]    Report Status PENDING  Incomplete  Culture, blood (routine x 2)     Status: None  (Preliminary result)   Collection Time: 04/02/20 10:50 AM   Specimen: BLOOD LEFT HAND  Result Value Ref Range Status   Specimen Description   Final    BLOOD LEFT HAND Performed at [facility], [address] [place], [place] [address]    Special Requests   Final    BOTTLES DRAWN AEROBIC AND ANAEROBIC Blood Culture adequate volume Performed at [facility], [address], [place], [place] [address]    Culture  Final    NO GROWTH < 24 HOURS Performed at [facility], [place] [address] [place], [place] [address]    Report Status PENDING  Incomplete  Resp Panel by RT-PCR (Flu A&B, Covid) Nasopharyngeal Swab     Status: None   Collection Time: 04/02/20 11:52 AM   Specimen: Nasopharyngeal Swab; Nasopharyngeal(NP) swabs in vial transport medium  Result Value Ref Range Status   SARS Coronavirus 2 by RT PCR NEGATIVE NEGATIVE Final    Comment: (NOTE) SARS-CoV-2 target nucleic acids are NOT DETECTED.  The SARS-CoV-2 RNA is generally detectable in upper respiratory specimens during the acute phase of infection. The lowest concentration of SARS-CoV-2 viral copies this assay can detect is 138 copies/mL. A negative result does not preclude SARS-Cov-2 infection and should not be used as the sole basis for treatment or other patient management decisions. A negative result may occur with  improper specimen collection/handling, submission of specimen other than nasopharyngeal swab, presence of viral mutation(s) within the areas targeted by this assay, and inadequate number of viral copies(<138 copies/mL). A negative result must be combined with clinical observations, patient history, and epidemiological information. The expected result is Negative.  Fact Sheet for Patients:  EntrepreneurPulse.com.au  Fact Sheet for Healthcare Providers:  IncredibleEmployment.be  This test is no t yet approved or cleared by the [place] FDA  and  has been authorized for detection and/or diagnosis of SARS-CoV-2 by FDA under an Emergency Use Authorization (EUA). This EUA will remain  in effect (meaning this test can be used) for the duration of the COVID-19 declaration under Section 564(b)(1) of the Act, 21 U.S.C.section 360bbb-3(b)(1), unless the authorization is terminated  or revoked sooner.       Influenza A by PCR NEGATIVE NEGATIVE Final   Influenza B by PCR NEGATIVE NEGATIVE Final    Comment: (NOTE) The Xpert Xpress SARS-CoV-2/FLU/RSV plus assay is intended as an aid in the diagnosis of influenza from Nasopharyngeal swab specimens and should not be used as a sole basis for treatment. Nasal washings and aspirates are unacceptable for Xpert Xpress SARS-CoV-2/FLU/RSV testing.  Fact Sheet for Patients: EntrepreneurPulse.com.au  Fact Sheet for Healthcare Providers: IncredibleEmployment.be  This test is not yet approved or cleared by the [place] FDA and has been authorized for detection and/or diagnosis of SARS-CoV-2 by FDA under an Emergency Use Authorization (EUA). This EUA will remain in effect (meaning this test can be used) for the duration of the COVID-19 declaration under Section 564(b)(1) of the Act, 21 U.S.C. section 360bbb-3(b)(1), unless the authorization is terminated or revoked.  Performed at [facility], [address], [place],  [address]          Radiology Studies: CT ABDOMEN PELVIS WO CONTRAST  Result Date: 04/02/2020 CLINICAL DATA:  Gastrointestinal bleed. Four episodes of coffee ground emesis. EXAM: CT ABDOMEN AND PELVIS WITHOUT CONTRAST TECHNIQUE: Multidetector CT imaging of the abdomen and pelvis was performed following the standard protocol without IV contrast. COMPARISON:  CT abdomen pelvis 09/26/2019. FINDINGS: Lower chest: Bibasilar linear atelectasis versus scarring. Left lower lobe calcification (2:12). Left anterior  descending coronary artery calcifications. No acute abnormality. Hepatobiliary: No focal liver abnormality. Layering hyperdensity within the gallbladder lumen likely represents tiny calcified gallstones. No gallbladder wall thickening or pericholecystic fluid. No biliary dilatation. Pancreas: No focal lesion. Normal pancreatic contour. No surrounding inflammatory changes. No main pancreatic ductal dilatation. Spleen: Normal in size without focal abnormality. Adrenals/Urinary Tract: No adrenal nodule bilaterally. Nonspecific bilateral trace perinephric stranding.  No nephrolithiasis, no hydronephrosis, and no contour-deforming renal mass. No ureterolithiasis or hydroureter. The urinary bladder is unremarkable. Stomach/Bowel: Stomach is decompressed and within normal limits. No evidence of bowel wall thickening or dilatation. No pneumatosis. The appendix is not definitely identified. Vascular/Lymphatic: No abdominal aorta or iliac aneurysm. Mild atherosclerotic plaque of the aorta and its branches. No abdominal, pelvic, or inguinal lymphadenopathy. Reproductive: Status post hysterectomy. No adnexal masses. Other: Trace nonspecific left pericolic gutter fat stranding. Trace nonspecific bilateral perinephric fat stranding. Trace nonspecific pelvic fat stranding. No intraperitoneal free fluid. No intraperitoneal free gas. No organized fluid collection. Musculoskeletal: No abdominal wall hernia or abnormality. No suspicious lytic or blastic osseous lesions. No acute displaced fracture. Multilevel degenerative changes of the spine. IMPRESSION: 1. No acute intra-abdominal or intrapelvic abnormality on this noncontrast study. 2. Hepatomegaly. 3.  Aortic Atherosclerosis (ICD10-I70.0). Electronically Signed   By: Iven Finn M.D.   On: 04/02/2020 22:03   DG Chest Port 1 View  Result Date: 04/02/2020 CLINICAL DATA:  Questionable sepsis - evaluate for abnormality EXAM: PORTABLE CHEST 1 VIEW COMPARISON:  10/02/2019 chest  radiograph and prior. FINDINGS: Mild hypoinflation. No focal consolidation, pneumothorax or pleural effusion. Cardiomediastinal silhouette within normal limits. No acute osseous abnormality. IMPRESSION: No focal consolidation. Electronically Signed   By: Primitivo Gauze M.D.   On: 04/02/2020 11:11   DG Foot Complete Left  Result Date: 04/02/2020 CLINICAL DATA:  Diabetic with foot wound. EXAM: LEFT FOOT - COMPLETE 3+ VIEW COMPARISON:  02/21/2020 FINDINGS: Similar appearance. Previous amputation of the great toe. Chronic subluxation at the metatarsal phalangeal joint of the second toe. Widespread regional soft tissue swelling with periosteal thickening the metatarsals. Chronic osteomyelitis not excluded. No destructive osseous change. Chronic flatfoot. IMPRESSION: Previous amputation of the great toe. Widespread regional soft tissue swelling with periosteal thickening the metatarsals. Chronic osteomyelitis not excluded, particularly of the second metatarsal. No destructive osseous change seen. Chronic dislocation at the second metatarsal phalangeal joint. No change since the previous radiographs five weeks ago. Electronically Signed   By: Nelson Chimes M.D.   On: 04/02/2020 11:19        Scheduled Meds: . heparin  5,000 Units Subcutaneous Q8H  . insulin aspart  0-20 Units Subcutaneous TID WC  . insulin aspart  0-5 Units Subcutaneous QHS  . insulin NPH Human  50 Units Subcutaneous BID AC & HS  . pantoprazole (PROTONIX) IV  40 mg Intravenous Q12H   Continuous Infusions: . cefTRIAXone (ROCEPHIN)  IV 1 g (04/03/20 1218)     LOS: 1 day        Hosie Poisson, MD [facility]   To contact the attending provider between 7A-7P or the covering provider during after hours 7P-7A, please log into the web site [email or web address] and access using universal [place] password for that web site. If you do not have the password, please call the hospital operator.  04/03/2020, 12:44 PM

## 2020-04-03 NOTE — Consult Note (Signed)
Referring Provider: Dr. Hosie Poisson Primary Care Physician:  [name], DO Primary Gastroenterologist:  Dr. Henrene Pastor   Reason for Consultation:  Nausea, vomiting coffee ground emesis   HPI: Dana [name] is a 64 y.o. female with a past medical history of bipolar disorder, hypertension, hyperlipidemia, fatty liver, DM II on insulin, IDA and erythropoietin deficiency secondary to poorly controlled diabetes followed by hematology, s/p right BKA 2019 with chronic left heel wound followed by the wound care clinic and questionable recent cat scratch to her LLE. She presented to [facility]  ED on 04/02/2020 with fatigue, anorexia, nausea and nonbloody nonbilious emesis.  Labs in the ED showed a Sodium 134.  Potassium 4.2.  BUN 48.  Creatinine 2.29 (baseline Cr 1.12 on 06/28/2019).  Lipase and LFTs were normal.  Lactic acid 1.8.  WBC 12.8.  Hemoglobin 9.6 (baseline Hg variable 12.4 - 10.5).  INR 1.1.  Influenza a and B were negative.  SARS coronavirus 2 negative.  Urine glucose greater than 500.  Ketones were negative.  Trace urine leukocytes.  She received Rocephin for possible UTI.  Chest x-ray without evidence of acute cardiopulmonary disease.  She was transferred to [facility] for admission.  An abdominal/pelvic CT without contrast without evidence of acute intra-abdominal or intrapelvic infectious or inflammatory process.  Hepatomegaly was noted.  She develops hematemesis yesterday evening.  GI consult was requested for further evaluation and EGD.  I spoke the nurse tech, [name], who verified the patient had projectile moderate volume red bloody emesis x1 followed by 2 episodes of coffee ground emesis.  No further vomiting since then.  She is awake this morning is sitting at the side of her bed naked.  She is calm but slightly agitated in no distress.  She is alert and cooperative.  She stated she was in the hospital due to having nausea, vomiting and abdominal pain.  She reports  passing normal bowel movement daily. No rectal bleeding or melena.  No prior history of GI bleed.  She takes aspirin 81 mg daily.  She denies any other NSAID use.  No alcohol use.  No current heartburn or dysphagia.  She reported having an EGD many years ago and further details are unknown.  She drinks 3-5 nondiet Dr. Malachi Bonds daily.  No history of cardiac or pulmonary disease as verified by her sister Dana [name] and son Dana [name].  No known family history of esophageal, gastric or colon cancer.  Her son Dana [name] is her power of attorney phone # [phone].   She was seen by Dr. Henrene Pastor in our outpatient GI clinic in 11/19/2017 due to having diarrhea and dysphagia. An EGD and colonoscopy were scheduled, however, she cancelled these procedures. She underwent a colonoscopy 08/29/1997 at [facility] due secondary to [place] which was normal. A small bowel series was done 4/23/199 which was normal.    CTAP 04/02/2020: 1. No acute intra-abdominal or intrapelvic abnormality on this noncontrast study. 2. Hepatomegaly. 3.  Aortic Atherosclerosis   Past Medical History:  Diagnosis Date  . Bipolar 1 disorder ([place])   . Cellulitis and abscess of foot 03/08/2015  . Diabetes mellitus    INSULIN DEPENDENT  . Diabetic neuropathy ([place])   . 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 H/O hiatal hernia   . Hyperlipidemia   . Hypertension    past hx of  . Iron malabsorption 09/26/2015  . Numbness and tingling    Hx; of in B/LLE and B/LUE  . Other iron deficiency anemias 09/20/2015  Past Surgical History:  Procedure Laterality Date  . ABDOMINAL HYSTERECTOMY    . ADENOIDECTOMY     Hx: of  . AMPUTATION  10/24/2011   Procedure: AMPUTATION RAY;  Surgeon: Newt Minion, MD;  Location: [place];  Service: Orthopedics;  Laterality: Right;  Right Foot 3rd Ray Amputation  . AMPUTATION Right 12/16/2012   Procedure: Right Foot Transmetatarsal Amputation;  Surgeon: Newt Minion, MD;  Location: [place];  Service: Orthopedics;  Laterality: Right;    . AMPUTATION Left 03/09/2015   Procedure: LEFT FOOT 1ST RAY AMPUTATION;  Surgeon: Newt Minion, MD;  Location: [place];  Service: Orthopedics;  Laterality: Left;  . BLADDER SURGERY     x 2, tacked 1st time; mesh "eroded", had to be removed  . Bladder Tact   2002  . BREAST SURGERY Left    "knot removed"  . CARPAL TUNNEL RELEASE Left   . COLON SURGERY    . EYE SURGERY  11/17/2017  . NASAL SEPTUM SURGERY  1976  . TONSILLECTOMY     age 65's    Prior to Admission medications   Medication Sig Start Date End Date Taking? Authorizing Provider  Apoaequorin (PREVAGEN PO) Take 1 capsule by mouth daily.   Yes [provider]  aspirin 81 MG EC tablet Take 81 mg by mouth daily. Swallow whole.   Yes [provider]  atorvastatin (LIPITOR) 20 MG tablet Take 1 tablet (20 mg total) by mouth at bedtime. 07/20/18  Yes Scot Jun, FNP  benztropine (COGENTIN) 1 MG tablet Take 0.5-1 mg by mouth See admin instructions. Takes 1 mg in the night and 0.5 mg at night   Yes [provider]  busPIRone (BUSPAR) 10 MG tablet Take 20 mg by mouth 3 (three) times daily.   Yes [provider]  famotidine (PEPCID) 20 MG tablet Take 20 mg by mouth daily.   Yes [provider]  Ferrous Sulfate (IRON) 325 (65 Fe) MG TABS Take 325 mg by mouth daily.    Yes [provider]  furosemide (LASIX) 40 MG tablet Take 40 mg by mouth 2 (two) times daily.   Yes [provider]  gabapentin (NEURONTIN) 600 MG tablet Take 600 mg by mouth 2 (two) times daily.    Yes [provider]  HUMULIN R U-500 KWIKPEN 500 UNIT/ML kwikpen Inject 30 Units into the skin 3 (three) times daily with meals.  07/31/19  Yes [provider]  lamoTRIgine (LAMICTAL) 200 MG tablet Take 200 mg by mouth daily.   Yes [provider]  metFORMIN (GLUCOPHAGE-XR) 500 MG 24 hr tablet TAKE 2 TABLETS IN THE MORNING AND 2 TABLETS AT NIGHT. Patient taking differently: Take 1,000 mg by  mouth 2 (two) times daily.  12/09/17  Yes Elayne Snare, MD  Multiple Vitamins-Minerals (CENTRUM ADULTS PO) Take 1 tablet by mouth daily.   Yes [provider]  NOVOLIN N RELION 100 UNIT/ML injection INJECT 60 UNITS AM and 50 units PM SUBCUTANEOUSLY TWICE DAILY FOR 30 DAYS Patient taking differently: Inject 75 Units into the skin 3 (three) times daily.  07/20/18  Yes Scot Jun, FNP  pantoprazole (PROTONIX) 40 MG tablet Take 1 tablet (40 mg total) by mouth daily. 07/20/18  Yes Scot Jun, FNP  QUEtiapine (SEROQUEL) 100 MG tablet Take 1 tablet (100 mg total) by mouth at bedtime. 10/02/19  Yes Hosie Poisson, MD  BD VEO INSULIN SYRINGE U/F 31G X 15/64" 1 ML MISC Administer insulin up 3  times per day per sliding scale E.11.9 07/20/18   Scot Jun, FNP  busPIRone (BUSPAR) 7.5 MG tablet Take 1 tablet (7.5 mg total) by mouth 2 (two) times daily. Patient not taking: Reported on 04/02/2020 10/02/19   Hosie Poisson, MD  Continuous Blood Gluc Sensor (FREESTYLE LIBRE 14 DAY SENSOR) MISC SMARTSIG:1 Each Topical Every 2 Weeks 07/17/19   [provider]  furosemide (LASIX) 20 MG tablet TAKE 1 TABLET BY MOUTH TWICE DAILY Patient not taking: Reported on 04/02/2020 02/24/18   Brunetta Jeans, PA-C  gabapentin (NEURONTIN) 400 MG capsule Take 1 capsule (400 mg total) by mouth 3 (three) times daily. Patient not taking: Reported on 04/02/2020 10/02/19   Hosie Poisson, MD  glucose blood (FREESTYLE LITE) test strip USE AS INSTRUCTED TO CHECK BLOOD SUGAR 3 TIMES DAILY. DX:E11.65 07/20/18   Scot Jun, FNP  Lancets (FREESTYLE) lancets Use as instructed to check blood sugar 3 times per day dx code E11.39 07/20/18   Scot Jun, FNP  metoprolol succinate (TOPROL-XL) 25 MG 24 hr tablet Take 1 tablet (25 mg total) by mouth daily. Patient not taking: Reported on 04/02/2020 10/03/19   Hosie Poisson, MD  Ostomy Supplies (SKIN TAC ADHESIVE BARRIER WIPE) MISC 1 each by Does not apply  route daily. 11/25/17   Elayne Snare, MD    Current Facility-Administered Medications  Medication Dose Route Frequency Provider Last Rate Last Admin  . cefTRIAXone (ROCEPHIN) 1 g in sodium chloride 0.9 % 100 mL IVPB  1 g Intravenous Q24H Kyle, Tyrone A, DO      . heparin injection 5,000 Units  5,000 Units Subcutaneous Q8H Kyle, Tyrone A, DO   5,000 Units at 04/03/20 0508  . insulin aspart (novoLOG) injection 0-20 Units  0-20 Units Subcutaneous TID WC Kyle, Tyrone A, DO   20 Units at 04/02/20 1842  . insulin aspart (novoLOG) injection 0-5 Units  0-5 Units Subcutaneous QHS Kyle, Tyrone A, DO   5 Units at 04/02/20 2300  . insulin NPH Human (NOVOLIN N) injection 20 Units  20 Units Subcutaneous BID AC & HS Kyle, Tyrone A, DO   20 Units at 04/02/20 1845  . ondansetron (ZOFRAN) tablet 4 mg  4 mg Oral Q6H PRN Marylyn Ishihara, Tyrone A, DO       Or  . ondansetron (ZOFRAN) injection 4 mg  4 mg Intravenous Q6H PRN Marylyn Ishihara, Tyrone A, DO   4 mg at 04/03/20 0212  . pantoprazole (PROTONIX) injection 40 mg  40 mg Intravenous Q12H Kyle, Tyrone A, DO   40 mg at 04/02/20 2258    Allergies as of 04/02/2020 - Review Complete 04/02/2020  Allergen Reaction Noted  . Ativan [lorazepam] Anaphylaxis 10/23/2011  . Codeine Itching and Other (See Comments) 01/14/2017  . Demerol [meperidine] Anaphylaxis 10/23/2011  . Keflex [cephalexin] Shortness Of Breath and Rash 12/02/2016  . Lithium Nausea And Vomiting and Other (See Comments) 03/08/2015  . Doxycycline Other (See Comments) 10/14/2016  . Oxycodone Other (See Comments) 10/23/2011  . Darvocet [propoxyphene n-acetaminophen] Itching 10/23/2011  . Latex Itching 10/23/2011  . Propoxyphene Itching 10/23/2011    Family History  Problem Relation Age of Onset  . Diabetes Mother   . Mental illness Mother   . Bipolar disorder Mother   . Hypertension Father   . Osteoarthritis Father   . Heart disease Father   . Diabetes Sister   . Early death Brother        MVA  . Healthy Sister     .  Diabetes Maternal Grandmother     Social History   Socioeconomic History  . Marital status: Married    Spouse name: Not on file  . Number of children: 2  . Years of education: 35  . Highest education level: Not on file  Occupational History    Comment: disabled  Tobacco Use  . Smoking status: Never Smoker  . Smokeless tobacco: Never Used  Vaping Use  . Vaping Use: Never used  Substance and Sexual Activity  . Alcohol use: No    Alcohol/week: 0.0 standard drinks  . Drug use: No  . Sexual activity: Never    Partners: Male  Other Topics Concern  . Not on file  Social History Narrative   Lives with husband in home   Caffeine use - Coke 2 or 3 16 oz daily   Social Determinants of Health   Financial Resource Strain:   . Difficulty of Paying Living Expenses: Not on file  Food Insecurity:   . Worried About Charity fundraiser in the Last Year: Not on file  . Ran Out of Food in the Last Year: Not on file  Transportation Needs:   . Lack of Transportation (Medical): Not on file  . Lack of Transportation (Non-Medical): Not on file  Physical Activity:   . Days of Exercise per Week: Not on file  . Minutes of Exercise per Session: Not on file  Stress:   . Feeling of Stress : Not on file  Social Connections:   . Frequency of Communication with Friends and Family: Not on file  . Frequency of Social Gatherings with Friends and Family: Not on file  . Attends Religious Services: Not on file  . Active Member of Clubs or Organizations: Not on file  . Attends Archivist Meetings: Not on file  . Marital Status: Not on file  Intimate Partner Violence:   . Fear of Current or Ex-Partner: Not on file  . Emotionally Abused: Not on file  . Physically Abused: Not on file  . Sexually Abused: Not on file    Review of Systems: Gen: Denies fever, sweats or chills. No weight loss.  CV: Denies chest pain, palpitations or edema. Resp: Denies cough, shortness of breath of  hemoptysis.  GI: See HPI. GU : Denies urinary burning, blood in urine, increased urinary frequency or incontinence. MS: Denies joint pain, muscles aches or weakness. Derm: Denies rash or lesions.  Chronic left heel wound. Psych: Denies depression or anxiety. Heme: Denies easy bruising, bleeding. Neuro:  Denies headaches, dizziness or paresthesias. Endo:  + Poorly controlled diabetes.  Physical Exam: Vital signs in last 24 hours: Temp:  [98 F (36.7 C)-98.8 F (37.1 C)] 98 F (36.7 C) (11/23 0555) Pulse Rate:  [74-89] 74 (11/23 0555) Resp:  [10-26] 14 (11/23 0555) BP: (136-166)/(57-112) 149/70 (11/23 0555) SpO2:  [89 %-100 %] 99 % (11/23 0555) Weight:  [96.2 kg-98.9 kg] 96.2 kg (11/23 0500)   General:  Alert but somewhat lethargic.  No acute distress. Head:  Normocephalic and atraumatic. Eyes:  No scleral icterus. Conjunctiva pink. Ears:  Normal auditory acuity. Nose:  No deformity, discharge or lesions. Mouth: Absent dentition.  No ulcers or lesions.  Neck:  Supple. No lymphadenopathy or thyromegaly.  Lungs: Sounds clear throughout. Heart: Regular rate and rhythm.  No murmurs. Abdomen: Protuberant, nontender.  Positive bowel sounds to all 4 quadrants.  No obvious HSM. Rectal: Deferred. Musculoskeletal:  Symmetrical without gross deformities.  Pulses:  Normal pulses noted. Extremities:  Right below the knee amputation with prosthesis in use.  Left heel wound. Neurologic:  Alert and  oriented x 3. No focal deficits.  Speech is clear.  She answers questions appropriately. Skin:  Intact without significant lesions or rashes. Psych:  Alert and cooperative, lethargic.   Intake/Output from previous day: 11/22 0701 - 11/23 0700 In: 1100 [IV Piggyback:1100] Out: 450 [Urine:450] Intake/Output this shift: No intake/output data recorded.  Lab Results: Recent Labs    04/02/20 0957 04/02/20 1744 04/03/20 0350  WBC 12.8* 12.6* 13.1*  HGB 9.6* 9.3* 8.6*  HCT 28.9* 28.5* 26.6*    PLT 337 311 328   BMET Recent Labs    04/02/20 0957 04/02/20 1744 04/03/20 0350  NA 134*  --  140  K 4.2  --  3.7  CL 96*  --  102  CO2 26  --  25  GLUCOSE 516*  --  371*  BUN 48*  --  46*  CREATININE 2.29* 2.12* 1.95*  CALCIUM 8.8*  --  8.4*   LFT Recent Labs    04/03/20 0350  PROT 7.5  ALBUMIN 3.2*  AST 16  ALT 21  ALKPHOS 86  BILITOT 0.6   PT/INR Recent Labs    04/02/20 0957  LABPROT 14.2  INR 1.1   Hepatitis Panel No results for input(s): HEPBSAG, HCVAB, HEPAIGM, HEPBIGM in the last 72 hours.    Studies/Results: CT ABDOMEN PELVIS WO CONTRAST  Result Date: 04/02/2020 CLINICAL DATA:  Gastrointestinal bleed. Four episodes of coffee ground emesis. EXAM: CT ABDOMEN AND PELVIS WITHOUT CONTRAST TECHNIQUE: Multidetector CT imaging of the abdomen and pelvis was performed following the standard protocol without IV contrast. COMPARISON:  CT abdomen pelvis 09/26/2019. FINDINGS: Lower chest: Bibasilar linear atelectasis versus scarring. Left lower lobe calcification (2:12). Left anterior descending coronary artery calcifications. No acute abnormality. Hepatobiliary: No focal liver abnormality. Layering hyperdensity within the gallbladder lumen likely represents tiny calcified gallstones. No gallbladder wall thickening or pericholecystic fluid. No biliary dilatation. Pancreas: No focal lesion. Normal pancreatic contour. No surrounding inflammatory changes. No main pancreatic ductal dilatation. Spleen: Normal in size without focal abnormality. Adrenals/Urinary Tract: No adrenal nodule bilaterally. Nonspecific bilateral trace perinephric stranding. No nephrolithiasis, no hydronephrosis, and no contour-deforming renal mass. No ureterolithiasis or hydroureter. The urinary bladder is unremarkable. Stomach/Bowel: Stomach is decompressed and within normal limits. No evidence of bowel wall thickening or dilatation. No pneumatosis. The appendix is not definitely identified.  Vascular/Lymphatic: No abdominal aorta or iliac aneurysm. Mild atherosclerotic plaque of the aorta and its branches. No abdominal, pelvic, or inguinal lymphadenopathy. Reproductive: Status post hysterectomy. No adnexal masses. Other: Trace nonspecific left pericolic gutter fat stranding. Trace nonspecific bilateral perinephric fat stranding. Trace nonspecific pelvic fat stranding. No intraperitoneal free fluid. No intraperitoneal free gas. No organized fluid collection. Musculoskeletal: No abdominal wall hernia or abnormality. No suspicious lytic or blastic osseous lesions. No acute displaced fracture. Multilevel degenerative changes of the spine. IMPRESSION: 1. No acute intra-abdominal or intrapelvic abnormality on this noncontrast study. 2. Hepatomegaly. 3.  Aortic Atherosclerosis (ICD10-I70.0). Electronically Signed   By: Iven Finn M.D.   On: 04/02/2020 22:03   DG Chest Port 1 View  Result Date: 04/02/2020 CLINICAL DATA:  Questionable sepsis - evaluate for abnormality EXAM: PORTABLE CHEST 1 VIEW COMPARISON:  10/02/2019 chest radiograph and prior. FINDINGS: Mild hypoinflation. No focal consolidation, pneumothorax or pleural effusion. Cardiomediastinal silhouette within normal limits. No acute osseous abnormality. IMPRESSION: No focal consolidation. Electronically Signed   By: Milus Mallick.D.  On: 04/02/2020 11:11   DG Foot Complete Left  Result Date: 04/02/2020 CLINICAL DATA:  Diabetic with foot wound. EXAM: LEFT FOOT - COMPLETE 3+ VIEW COMPARISON:  02/21/2020 FINDINGS: Similar appearance. Previous amputation of the great toe. Chronic subluxation at the metatarsal phalangeal joint of the second toe. Widespread regional soft tissue swelling with periosteal thickening the metatarsals. Chronic osteomyelitis not excluded. No destructive osseous change. Chronic flatfoot. IMPRESSION: Previous amputation of the great toe. Widespread regional soft tissue swelling with periosteal thickening the  metatarsals. Chronic osteomyelitis not excluded, particularly of the second metatarsal. No destructive osseous change seen. Chronic dislocation at the second metatarsal phalangeal joint. No change since the previous radiographs five weeks ago. Electronically Signed   By: Nelson Chimes M.D.   On: 04/02/2020 11:19    IMPRESSION/PLAN:  86. 64 year old female with N/V, initially nonbloody emesis x 2 to 3 days then developed red hematemesis x 1 followed by coffee ground emesis x 2 on 04/02/2020. CTAP without contrasts without acute intra-abdominal/pelvic abnormality.  -NPO -EGD benefits and risks discussed including risk with sedation, risk of bleeding, perforation and infection. I spoke to the patient's son Dana [name] phone # [phone], he is her power of attorney.  I discussed EGD procedure in full detail including the benefits and risks.  He provided verbal consent to proceed with an EGD. -NS IV @ 75 cc/hr -Continue PPI IV twice daily for now -Further recommendations to be determined after EGD completed  2. DM II on insulin, poorly controlled. Glucose today 371.   3. AKI, improving. Cr 1.95  4. Macrocytic anemia. Hg 8.6 (baseline Hg 12). Iron 36. TIBC 225. Vitamin B12 260.   5. Leukocytosis, possible UTI. Chronic left heel wound. WBC 13.1. She is afebrile. On Rocephin.   6. Hepatomegaly, history of hepatic steatosis. Normal LFTs.       Patrecia Pour Kennedy-Smith  04/03/2020, 7:14 AM

## 2020-04-03 NOTE — Op Note (Signed)
[facility] Patient Name: Dana [name] Procedure Date: 04/03/2020 MRN: [id number] Attending MD: Gerrit Heck , MD Date of Birth: 03-18-1956 CSN: [id number] Age: 64 Admit Type: Outpatient Procedure:                Upper GI endoscopy Indications:              Hematemesis Providers:                Gerrit Heck, MD, Josie Dixon, RN, Fransico Setters                            [place], Technician Referring MD:              Medicines:                Monitored Anesthesia Care Complications:            No immediate complications. Estimated Blood Loss:     Estimated blood loss was minimal. Procedure:                Pre-Anesthesia Assessment:                           - Prior to the procedure, a History and Physical                            was performed, and patient medications and                            allergies were reviewed. The patient's tolerance of                            previous anesthesia was also reviewed. The risks                            and benefits of the procedure and the sedation                            options and risks were discussed with the patient.                            All questions were answered, and informed consent                            was obtained. Prior Anticoagulants: The patient has                            taken no previous anticoagulant or antiplatelet                            agents. ASA Grade Assessment: III - A patient with                            severe systemic disease. After reviewing the risks                            and  benefits, the patient was deemed in                            satisfactory condition to undergo the procedure.                           After obtaining informed consent, the endoscope was                            passed under direct vision. Throughout the                            procedure, the patient's blood pressure, pulse, and                            oxygen saturations  were monitored continuously. The                            GIF-[id number] ([id number] Olympus gastroscope was                            introduced through the mouth, and advanced to the                            second part of duodenum. The upper GI endoscopy was                            accomplished without difficulty. The patient                            tolerated the procedure well. Scope In: Scope Out: Findings:      The examined esophagus was normal.      The Z-line was regular and was found 38 cm from the incisors.      A few small sessile polyps with no bleeding and no stigmata of recent       bleeding were found in the gastric fundus and in the gastric body.       Several of these polyps were removed with a cold biopsy forceps for       histologic representative evaluation. Resection and retrieval were       complete. Estimated blood loss was minimal.      Normal mucosa was found in the entire examined stomach. There was no       hematin and no stigmata of recent bleeding.      The ampulla, duodenal bulb, first portion of the duodenum and second       portion of the duodenum were normal. Impression:               - Normal esophagus.                           - Z-line regular, 38 cm from the incisors.                           - A few gastric polyps. Resected and retrieved.                           -  Normal mucosa was found in the entire stomach.                           - Normal ampulla, duodenal bulb, first portion of                            the duodenum and second portion of the duodenum.                           - Other than benign appearing gastric polyps, this                            was a normal study. No stigmata of recent bleeding                            and no heme noted on repeated anterograde and                            retroflexed views. Moderate Sedation:      Not Applicable - Patient had care per Anesthesia. Recommendation:           - Return  patient to hospital ward for ongoing care.                           - Advance diet as tolerated.                           - Continue present medications.                           - Continue antiemetics.                           - Can reduce PPI to Protonix 40 mg daily.                           - Await pathology results.                           - Continue serial Hgb/Hct checks for now.                           - Repeat upper endoscopy prn any concern for                            recurrent bleeding.                           - Antimicrobial therapy per primary service.                           - Discussed results with patient in person and son                            via telephone.                           -  GI service will sign off at this time. Please do                            not hesitate to contact us with any additional                            questions or concerns. Procedure Code(s):        --- Professional ---                           [phone], Esophagogastroduodenoscopy, flexible,                            transoral; with biopsy, single or multiple Diagnosis Code(s):        --- Professional ---                           K31.7, Polyp of stomach and duodenum                           K92.0, Hematemesis CPT copyright 2019 [employer]. All rights reserved. The codes documented in this report are preliminary and upon coder review may  be revised to meet current compliance requirements. Gerrit Heck, MD 04/03/2020 10:28:45 AM Number of Addenda: 0

## 2020-04-04 ENCOUNTER — Encounter: Payer: Self-pay | Admitting: Gastroenterology

## 2020-04-04 ENCOUNTER — Encounter (HOSPITAL_COMMUNITY): Payer: Self-pay | Admitting: Gastroenterology

## 2020-04-04 ENCOUNTER — Telehealth: Payer: Self-pay | Admitting: Podiatry

## 2020-04-04 DIAGNOSIS — N390 Urinary tract infection, site not specified: Secondary | ICD-10-CM

## 2020-04-04 DIAGNOSIS — F319 Bipolar disorder, unspecified: Secondary | ICD-10-CM

## 2020-04-04 DIAGNOSIS — M86672 Other chronic osteomyelitis, left ankle and foot: Secondary | ICD-10-CM

## 2020-04-04 DIAGNOSIS — B9620 Unspecified Escherichia coli [E. coli] as the cause of diseases classified elsewhere: Secondary | ICD-10-CM

## 2020-04-04 DIAGNOSIS — E538 Deficiency of other specified B group vitamins: Secondary | ICD-10-CM

## 2020-04-04 DIAGNOSIS — N179 Acute kidney failure, unspecified: Secondary | ICD-10-CM

## 2020-04-04 LAB — CBC
HCT: 30.8 % — ABNORMAL LOW (ref 36.0–46.0)
Hemoglobin: 9.8 g/dL — ABNORMAL LOW (ref 12.0–15.0)
MCH: 32.3 pg (ref 26.0–34.0)
MCHC: 31.8 g/dL (ref 30.0–36.0)
MCV: 101.7 fL — ABNORMAL HIGH (ref 80.0–100.0)
Platelets: 351 10*3/uL (ref 150–400)
RBC: 3.03 MIL/uL — ABNORMAL LOW (ref 3.87–5.11)
RDW: 13.6 % (ref 11.5–15.5)
WBC: 12.4 10*3/uL — ABNORMAL HIGH (ref 4.0–10.5)
nRBC: 0 % (ref 0.0–0.2)

## 2020-04-04 LAB — BASIC METABOLIC PANEL
Anion gap: 13 (ref 5–15)
BUN: 42 mg/dL — ABNORMAL HIGH (ref 8–23)
CO2: 25 mmol/L (ref 22–32)
Calcium: 9 mg/dL (ref 8.9–10.3)
Chloride: 105 mmol/L (ref 98–111)
Creatinine, Ser: 1.77 mg/dL — ABNORMAL HIGH (ref 0.44–1.00)
GFR, Estimated: 32 mL/min — ABNORMAL LOW (ref >60)
Glucose, Bld: 177 mg/dL — ABNORMAL HIGH (ref 70–99)
Potassium: 3.8 mmol/L (ref 3.5–5.1)
Sodium: 143 mmol/L (ref 135–145)

## 2020-04-04 LAB — GLUCOSE, CAPILLARY
Glucose-Capillary: 179 mg/dL — ABNORMAL HIGH (ref 70–99)
Glucose-Capillary: 339 mg/dL — ABNORMAL HIGH (ref 70–99)
Glucose-Capillary: 309 mg/dL — ABNORMAL HIGH (ref 70–99)
Glucose-Capillary: 317 mg/dL — ABNORMAL HIGH (ref 70–99)

## 2020-04-04 LAB — URINE CULTURE: Culture: >=100000 — AB

## 2020-04-04 LAB — SURGICAL PATHOLOGY

## 2020-04-04 MED ORDER — INSULIN NPH (HUMAN) (ISOPHANE) 100 UNIT/ML ~~LOC~~ SUSP
75.0000 [IU] | Freq: Three times a day (TID) | SUBCUTANEOUS | Status: DC
  Administered 2020-04-05 (×3): 75 [IU] via SUBCUTANEOUS

## 2020-04-04 MED ORDER — METOPROLOL SUCCINATE ER 25 MG PO TB24
25.0000 mg | ORAL_TABLET | Freq: Every day | ORAL | Status: DC
  Administered 2020-04-04 – 2020-04-05 (×2): 25 mg via ORAL
  Filled 2020-04-04 (×2): qty 1

## 2020-04-04 MED ORDER — CYANOCOBALAMIN 1000 MCG/ML IJ SOLN
1000.0000 ug | Freq: Every day | INTRAMUSCULAR | Status: DC
  Administered 2020-04-04 – 2020-04-05 (×2): 1000 ug via SUBCUTANEOUS
  Filled 2020-04-04 (×2): qty 1

## 2020-04-04 NOTE — Telephone Encounter (Signed)
Dr. Grandville Silos [phone]) called for hospital consult Chronic osteomyelitis of the left  Foot.  Pt is At [facility] Room : [phone]

## 2020-04-04 NOTE — Consult Note (Addendum)
PODIATRY CONSULTATION  Dana [name]             FOY:[id number]  DOB: 07/14/1955  DOA: 04/02/2020 PCP: [name], DO    CC:     Left heel diabetic foot ulcer  HPI: 64 y.o. female PMHx IDDM, bipolar, hyperlipidemia originally admitted to the hospital for persistent nausea vomiting followed by lethargy.  Patient has been seen and followed at the [facility] wound care center for a left posterior heel ulcer.  She states that she has been going weekly and applying Silvadene cream with a light dressing.She had x-rays taken of the left foot upon admission 04/02/2020 concerning for possible chronic osteomyelitis.  Podiatry consulted.  Patient seen at bedside this evening and she is very pleasant.  She states that earlier today she was unwilling to have the MRI performed of her left foot.  She was happy to have me evaluate her foot and lower extremity.  Past Medical History:  Diagnosis Date  . Bipolar 1 disorder ([place])   . Cellulitis and abscess of foot 03/08/2015  . Diabetes mellitus    INSULIN DEPENDENT  . Diabetic neuropathy ([place])   . 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 H/O hiatal hernia   . Hyperlipidemia   . Hypertension    past hx of  . Iron malabsorption 09/26/2015  . Numbness and tingling    Hx; of in B/LLE and B/LUE  . Other iron deficiency anemias 09/20/2015     CBC Latest Ref Rng & Units 04/04/2020 04/03/2020 04/02/2020  WBC 4.0 - 10.5 K/uL 12.4(H) 13.1(H) 12.6(H)  Hemoglobin 12.0 - 15.0 g/dL 9.8(L) 8.6(L) 9.3(L)  Hematocrit 36 - 46 % 30.8(L) 26.6(L) 28.5(L)  Platelets 150 - 400 K/uL [phone]      Physical Exam: General: The patient is alert and oriented x3 in no acute distress.  Dermatology: Skin is warm, dry and supple bilateral lower extremities. Negative for open lesions or macerations.  There is some very superficial flaking crust to the posterior heel.  However upon evaluation there is no open wound.  No drainage noted.  No maceration noted.  What  ever prior wound was pleasant it appears to have been completely healed.  Vascular: No edema or erythema noted.   Neurological: Epicritic and protective threshold absent bilaterally.   Musculoskeletal Exam: BKA RLE.  Partial first ray amputation left foot.  Radiographic Exam taken 04/02/2020:  Previous amputation of the great toe. Widespread regional soft tissue swelling with periosteal thickening the metatarsals. Chronic osteomyelitis not excluded, particularly of the second metatarsal. No destructive osseous change seen. Chronic dislocation at the second metatarsal phalangeal joint. No change since the previous radiographs five weeks ago.  Assessment: 1. H/o ulcer left posterior heel-resolved 2. H/o partial first ray amputation left 3. H/o BKA RLE 4. DM -insulin-dependent   Plan of Care:  1. Patient evaluated. X-Rays reviewed that were taken 04/02/2020.  2.  Based on radiographic exam there has been no change since previous radiographs 5 weeks ago.  I do not see the need for additional imaging.  Left foot appears very stable with no sign of erythema or edema.  No cellulitis noted within the foot. 3.  Recommend Silvadene cream daily to the left posterior heel.  This is the same regimen from the wound care center.  Recommend follow-up with the wound care center upon discharge 4.  I do recommend close follow-up and observation from either wound care or podiatry.  Recommend follow-up x-ray in 6 weeks.  5.  Podiatry will sign off.  Thank you for the consultation.  Please contact me with any questions or concerns. [phone]      Edrick Kins, DPM [facility]  Dr. Edrick Kins, DPM    [address], [place] [address]                Office [phone]  Fax [phone]

## 2020-04-04 NOTE — Evaluation (Signed)
`Physical Therapy Evaluation Patient Details Name: Dana [name] MRN: [id number] DOB: Dec 11, 1955 Today's Date: 04/04/2020   History of Present Illness  Dana [name] is a 64 y.o. female with medical history significant of DM, bipolar, HLD, RBKA, chronic left heel wound, letf transmet amputation.Admitted 04/03/20 with  N/V, lethargy, glucose was found to be 500, slight leukocytosis. XR of left foot showed chronic osteomyelitis  Clinical Impression  The patient is eager to get Out of bed. Patient able to sit up on side of bed without assistance. Patient has  Right prosthesis on, placed on left ortho shoe. Patient stood with Rw and  Walked a few steps to recliner. Patient has to take care that right foot clears. Patient reports she has fallen due to prothesis slipping.  Patient reports mother and sister are with her at night, she is alone during day, son checks in. Patient was independent without Rw PTA. Is Independent w/ ADL's. Pt admitted with above diagnosis.  Pt currently with functional limitations due to the deficits listed below (see PT Problem List). Pt will benefit from skilled PT to increase their independence and safety with mobility to allow discharge to the venue listed below.  . Encouraged patient to not use purewick( her bed was soaked from malfunction) but instead call for assistance to ambulate to BR or use BSC with assistance.     Follow Up Recommendations No PT follow up    Equipment Recommendations  None recommended by PT    Recommendations for Other Services       Precautions / Restrictions Precautions Precautions: Fall Required Braces or Orthoses: Other Brace Other Brace: right prosthetic, ortho  shoe for left      Mobility  Bed Mobility Overal bed mobility: Modified Independent                  Transfers Overall transfer level: Needs assistance Equipment used: Rolling walker (2 wheeled) Transfers: Stand Pivot Transfers   Stand pivot transfers: Min  guard       General transfer comment: steady assist to rise from bed  Ambulation/Gait Ambulation/Gait assistance: Min assist Gait Distance (Feet): 5 Feet Assistive device: Rolling walker (2 wheeled) Gait Pattern/deviations: Step-to pattern Gait velocity: decr   General Gait Details: patient moves slowly and must make extra effort to clear right foot to take a step.  Stairs            Wheelchair Mobility    Modified Rankin (Stroke Patients Only)       Balance Overall balance assessment: Needs assistance Sitting-balance support: No upper extremity supported;Feet supported Sitting balance-Leahy Scale: Good     Standing balance support: During functional activity;Bilateral upper extremity supported Standing balance-Leahy Scale: Fair Standing balance comment: reliant on UE's and RW                             Pertinent Vitals/Pain Pain Assessment: No/denies pain    Home Living Family/patient expects to be discharged to:: Private residence Living Arrangements: Parent;Other relatives Available Help at Discharge: Family;Available PRN/intermittently Type of Home: House Home Access: Ramped entrance;Stairs to enter Entrance Stairs-Rails: Right Entrance Stairs-Number of Steps: 3 Home Layout: One level Home Equipment: Grab bars - toilet;Grab bars - tub/shower;Shower seat;Walker - 4 wheels      Prior Function Level of Independence: Independent         Comments: reports does not usually need the RW     Hand Dominance  Dominant Hand: Right    Extremity/Trunk Assessment   Upper Extremity Assessment Upper Extremity Assessment: Overall WFL for tasks assessed    Lower Extremity Assessment Lower Extremity Assessment: RLE deficits/detail;LLE deficits/detail RLE Deficits / Details: BKA ith prosthesis on LLE Deficits / Details: noted wound on heel    Cervical / Trunk Assessment Cervical / Trunk Assessment: Normal  Communication   Communication:  No difficulties  Cognition Arousal/Alertness: Awake/alert Behavior During Therapy: WFL for tasks assessed/performed Overall Cognitive Status: Within Functional Limits for tasks assessed                                        General Comments      Exercises     Assessment/Plan    PT Assessment Patient needs continued PT services  PT Problem List Decreased strength;Decreased knowledge of use of DME;Decreased activity tolerance;Decreased balance;Decreased mobility       PT Treatment Interventions DME instruction;Gait training;Functional mobility training;Therapeutic activities;Therapeutic exercise;Patient/family education    PT Goals (Current goals can be found in the Care Plan section)  Acute Rehab PT Goals Patient Stated Goal: to go home PT Goal Formulation: With patient Time For Goal Achievement: 04/18/20 Potential to Achieve Goals: Good    Frequency Min 3X/week   Barriers to discharge        Co-evaluation               AM-PAC PT "6 Clicks" Mobility  Outcome Measure Help needed turning from your back to your side while in a flat bed without using bedrails?: None Help needed moving from lying on your back to sitting on the side of a flat bed without using bedrails?: None Help needed moving to and from a bed to a chair (including a wheelchair)?: A Little Help needed standing up from a chair using your arms (e.g., wheelchair or bedside chair)?: A Little Help needed to walk in hospital room?: A Little Help needed climbing 3-5 steps with a railing? : A Lot 6 Click Score: 19    End of Session Equipment Utilized During Treatment: Gait belt Activity Tolerance: Patient tolerated treatment well Patient left: in chair;with call bell/phone within reach;with chair alarm set Nurse Communication: Mobility status PT Visit Diagnosis: Unsteadiness on feet (R26.81);Difficulty in walking, not elsewhere classified (R26.2)    Time: 7782-4235 PT Time Calculation  (min) (ACUTE ONLY): 20 min   Charges:   PT Evaluation $PT Eval Low Complexity: [place] [facility] Acute Rehabilitation Services Pager [phone] Office [phone]   Claretha Cooper 04/04/2020, 12:54 PM

## 2020-04-04 NOTE — Progress Notes (Signed)
PROGRESS NOTE    Dana [name]  ZOX:[id number] DOB: 1955/10/23 DOA: 04/02/2020 PCP: [name], DO   Chief Complaint  Patient presents with   Emesis    Brief Narrative:  Dana [name] a 64 y.o.femalewith medical history significant ofDM, bipolar, HLD admitted to the hospital for persistent nausea, vomiting since yesterday, it followed by  Lethargy, coffee ground emesis.  She was admitted for hyperglycemia from uncontrolled DM, coffee ground emesis, hematemesis, cellulitis of the left lower extremity.  GI consulted , underwent EGD, showed gastric polyps. Meanwhile her left foot x ray showed signs of chronic osteomyelitis, ordered MRi for further evaluation.     Assessment & Plan:   Active Problems:   Encephalopathy, metabolic   AMS (altered mental status)   Generalized abdominal pain   Benign fundic gland polyps of stomach   Low vitamin B12 level   E. coli UTI  1 hematemesis Patient presented hematemesis with nausea vomiting coffee-ground emesis.  GI consulted patient underwent upper endoscopy which showed gastric polyps.  GI recommended to continue PPI daily and advance diet as tolerated.  Patient tolerating clear liquids.  Advance to full liquid diet and if tolerates will advance to a soft diet.  H&H stable.  Follow.  2.  Cellulitis of left lower extremity with chronic left heel wounds Plain films done concerning for chronic osteomyelitis.  MRI of the left foot ordered however patient refused unable to tolerate.  Due to concerns for possible cat scratch disease azithromycin added to current regimen of IV Rocephin.  Wound care consulted.  Podiatry also consulted due to concerns for chronic osteomyelitis.  Patient seen in consultation by Dr. Amalia Hailey who reviewed prior films and feel no significant changes from previous radiographs from 5 weeks ago.  Per podiatry no need for any additional testing.  Silvadene cream daily recommended to left posterior heel, podiatry  recommended completion of a course of antibiotic treatment with close follow-up x-rays in about 6 weeks.  Outpatient follow-up with the wound care clinic.  3.  E. coli UTI Urine cultures with greater than 100,000 colonies of E. coli.  IV Rocephin.  Follow.  4.  Low vitamin B12 levels Vitamin B12 supplementation.  Outpatient follow-up.  5.  diabetes mellitus type 2 Hemoglobin A1c 6.9 (04/02/2020).  CBG of 179 this morning.  Patient tolerating clear liquids.  Advance to a full liquid diet.  If tolerates full liquid diet to could advance to a carb modified diet tomorrow.  Increase NPH 50 units 3 times daily.  Sliding scale insulin.  May need to uptitrate NPH as oral intake improves.  IV Reglan.  Follow.  Continue to hold oral hypoglycemic agents.  6.  History of bipolar disorder Continue home regimen of Cogentin, BuSpar, Lamictal, Seroquel.  Outpatient follow-up.  7.  Hypertension Lasix on hold.  Resume home regimen Toprol-XL.  Follow.  8.  Hyperlipidemia Continue statin.  9.  Anemia of chronic disease H&H stable.  Follow.  10.  Acute kidney injury Baseline creatinine approximately 1.4-1.7.  On admission creatinine noted to be 2.1.  Improved with gentle hydration and holding diuretics and other nephrotoxic agents.  Saline lock IV fluids.  Follow.   DVT prophylaxis: Heparin Code Status: Full Family Communication: No family at bedside.  Updated patient. Disposition:   Status is: Inpatient    Dispo: The patient is from: Home              Anticipated d/c is to: Home  Anticipated d/c date is: 1 to 2 days.              Patient currently on IV antibiotics, on clear liquid diet.  Not stable for discharge.       Consultants:   GI: Dr. Bryan Lemma 04/03/2020  Podiatry: Dr. Amalia Hailey 04/04/2020  Procedures:   CT abdomen and pelvis 04/02/2020  Plain films of the left foot 04/02/2020  Chest x-ray 04/02/2020  Upper endoscopy 04/03/2020 per Dr.  Bryan Lemma  Antimicrobials:   IV Rocephin 04/02/2020>>>>  IV azithromycin 04/03/2020>>>>   Subjective: Patient sitting up in bed.  Denies any chest pain or shortness of breath.  Feeling better.  Noted to have refused MRI of foot yesterday.  Patient denies any further nausea.  No hematemesis.  No abdominal pain.  Feeling better.  Tolerating clear liquids.  Per RN patient refusing telemetry.  Objective: Vitals:   04/03/20 1545 04/03/20 2010 04/04/20 0346 04/04/20 0500  BP: (!) 147/64 (!) 143/74 (!) 145/67   Pulse: 74 82 92   Resp: 17 18 15    Temp: 97.9 F (36.6 C) 98.1 F (36.7 C) 97.7 F (36.5 C)   TempSrc:      SpO2: 95% 99% 96%   Weight:    98.6 kg  Height:        Intake/Output Summary (Last 24 hours) at 04/04/2020 1103 Last data filed at 04/04/2020 1036 Gross per 24 hour  Intake 476 ml  Output 600 ml  Net -124 ml   Filed Weights   04/03/20 0500 04/03/20 0929 04/04/20 0500  Weight: 96.2 kg 96.2 kg 98.6 kg    Examination:  General exam: Appears calm and comfortable  Respiratory system: Clear to auscultation. Respiratory effort normal. Cardiovascular system: S1 & S2 heard, RRR. No JVD, murmurs, rubs, gallops or clicks. No pedal edema. Gastrointestinal system: Abdomen is nondistended, soft and nontender. No organomegaly or masses felt. Normal bowel sounds heard. Central nervous system: Alert and oriented. No focal neurological deficits. Extremities: Left lower extremity with decreased erythema, decreased warmth, some tenderness to palpation.  Posterior heel with some flaking.  Status post partial first ray amputation left foot. Skin: No rashes, lesions or ulcers Psychiatry: Judgement and insight appear normal. Mood & affect appropriate.     Data Reviewed: I have personally reviewed following labs and imaging studies  CBC: Recent Labs  Lab 04/02/20 0957 04/02/20 1744 04/03/20 0350 04/04/20 0419  WBC 12.8* 12.6* 13.1* 12.4*  NEUTROABS 10.2*  --  10.1*  --    HGB 9.6* 9.3* 8.6* 9.8*  HCT 28.9* 28.5* 26.6* 30.8*  MCV 97.0 100.4* 100.4* 101.7*  PLT 337 311 328 259    Basic Metabolic Panel: Recent Labs  Lab 04/02/20 0957 04/02/20 1744 04/03/20 0350 04/04/20 0419  NA 134*  --  140 143  K 4.2  --  3.7 3.8  CL 96*  --  102 105  CO2 26  --  25 25  GLUCOSE 516*  --  371* 177*  BUN 48*  --  46* 42*  CREATININE 2.29* 2.12* 1.95* 1.77*  CALCIUM 8.8*  --  8.4* 9.0    GFR: Estimated Creatinine Clearance: 35 mL/min (A) (by C-G formula based on SCr of 1.77 mg/dL (H)).  Liver Function Tests: Recent Labs  Lab 04/02/20 0957 04/03/20 0350  AST 17 16  ALT 24 21  ALKPHOS 92 86  BILITOT 0.7 0.6  PROT 7.8 7.5  ALBUMIN 3.5 3.2*    CBG: Recent Labs  Lab 04/03/20 0959 04/03/20  1135 04/03/20 1726 04/03/20 2111 04/04/20 0756  GLUCAP 401* 322* 264* 234* 179*     Recent Results (from the past 240 hour(s))  Culture, blood (routine x 2)     Status: None (Preliminary result)   Collection Time: 04/02/20  9:55 AM   Specimen: BLOOD  Result Value Ref Range Status   Specimen Description   Final    BLOOD RIGHT ANTECUBITAL Performed at [facility], [address] [place], [place] [address]    Special Requests   Final    BOTTLES DRAWN AEROBIC AND ANAEROBIC Blood Culture adequate volume Performed at [facility], [address] [place], [place] [address]    Culture   Final    NO GROWTH 2 DAYS Performed at [facility], [address] [place], [place] [address]    Report Status PENDING  Incomplete  Culture, blood (routine x 2)     Status: None (Preliminary result)   Collection Time: 04/02/20 10:50 AM   Specimen: BLOOD LEFT HAND  Result Value Ref Range Status   Specimen Description   Final    BLOOD LEFT HAND Performed at [facility], [address] [place], [place] [address]    Special Requests   Final    BOTTLES DRAWN AEROBIC AND ANAEROBIC Blood Culture adequate volume Performed at [facility], [address] [place], [place] [address]    Culture   Final    NO GROWTH 2 DAYS Performed at [facility], [address] [place], [place] [address]    Report Status PENDING  Incomplete  Resp Panel by RT-PCR (Flu A&B, Covid) Nasopharyngeal Swab     Status: None   Collection Time: 04/02/20 11:52 AM   Specimen: Nasopharyngeal Swab; Nasopharyngeal(NP) swabs in vial transport medium  Result Value Ref Range Status   SARS Coronavirus 2 by RT PCR NEGATIVE NEGATIVE Final    Comment: (NOTE) SARS-CoV-2 target nucleic acids are NOT DETECTED.  The SARS-CoV-2 RNA is generally detectable in upper respiratory specimens during the acute phase of infection. The lowest concentration of SARS-CoV-2 viral copies this assay can detect is 138 copies/mL. A negative result does not preclude SARS-Cov-2 infection and should not be used as the sole basis for treatment or other patient management decisions. A negative result may occur with  improper specimen collection/handling, submission of specimen other than nasopharyngeal swab, presence of viral mutation(s) within the areas targeted by this assay, and inadequate number of viral copies(<138 copies/mL). A negative result must be combined with clinical observations, patient history, and epidemiological information. The expected result is Negative.  Fact Sheet for Patients:  EntrepreneurPulse.com.au  Fact Sheet for Healthcare Providers:  IncredibleEmployment.be  This test is no t yet approved or cleared by the [place] FDA and  has been authorized for detection and/or diagnosis of SARS-CoV-2 by FDA under an Emergency Use Authorization (EUA). This EUA will remain  in effect (meaning this test can be used) for the duration of the COVID-19 declaration under Section 564(b)(1) of the Act, 21 U.S.C.section 360bbb-3(b)(1), unless the authorization is terminated  or revoked sooner.        Influenza A by PCR NEGATIVE NEGATIVE Final   Influenza B by PCR NEGATIVE NEGATIVE Final    Comment: (NOTE) The Xpert Xpress SARS-CoV-2/FLU/RSV plus assay is intended as an aid in the diagnosis of influenza from Nasopharyngeal swab specimens and should not be used as a sole basis for  treatment. Nasal washings and aspirates are unacceptable for Xpert Xpress SARS-CoV-2/FLU/RSV testing.  Fact Sheet for Patients: EntrepreneurPulse.com.au  Fact Sheet for Healthcare Providers: IncredibleEmployment.be  This test is not yet approved or cleared by the [place] FDA and has been authorized for detection and/or diagnosis of SARS-CoV-2 by FDA under an Emergency Use Authorization (EUA). This EUA will remain in effect (meaning this test can be used) for the duration of the COVID-19 declaration under Section 564(b)(1) of the Act, 21 U.S.C. section 360bbb-3(b)(1), unless the authorization is terminated or revoked.  Performed at [facility], [address] [place], [place] [address]   Urine culture     Status: Abnormal   Collection Time: 04/02/20 12:41 PM   Specimen: In/Out Cath Urine  Result Value Ref Range Status   Specimen Description   Final    IN/OUT CATH URINE Performed at [facility], [address] [place], [place] [address]    Special Requests   Final    NONE Performed at [facility], [address] [place], [place] [address]    Culture >=100,000 COLONIES/mL ESCHERICHIA COLI (A)  Final   Report Status 04/04/2020 FINAL  Final   Organism ID, Bacteria ESCHERICHIA COLI (A)  Final      Susceptibility   Escherichia coli - MIC*    AMPICILLIN >=32 RESISTANT Resistant     CEFAZOLIN <=4 SENSITIVE Sensitive     CEFEPIME <=0.12 SENSITIVE Sensitive     CEFTRIAXONE <=0.25 SENSITIVE Sensitive     CIPROFLOXACIN <=0.25 SENSITIVE Sensitive     GENTAMICIN <=1 SENSITIVE Sensitive     IMIPENEM <=0.25 SENSITIVE Sensitive      NITROFURANTOIN 32 SENSITIVE Sensitive     TRIMETH/SULFA <=20 SENSITIVE Sensitive     AMPICILLIN/SULBACTAM 16 INTERMEDIATE Intermediate     PIP/TAZO <=4 SENSITIVE Sensitive     * >=100,000 COLONIES/mL ESCHERICHIA COLI         Radiology Studies: CT ABDOMEN PELVIS WO CONTRAST  Result Date: 04/02/2020 CLINICAL DATA:  Gastrointestinal bleed. Four episodes of coffee ground emesis. EXAM: CT ABDOMEN AND PELVIS WITHOUT CONTRAST TECHNIQUE: Multidetector CT imaging of the abdomen and pelvis was performed following the standard protocol without IV contrast. COMPARISON:  CT abdomen pelvis 09/26/2019. FINDINGS: Lower chest: Bibasilar linear atelectasis versus scarring. Left lower lobe calcification (2:12). Left anterior descending coronary artery calcifications. No acute abnormality. Hepatobiliary: No focal liver abnormality. Layering hyperdensity within the gallbladder lumen likely represents tiny calcified gallstones. No gallbladder wall thickening or pericholecystic fluid. No biliary dilatation. Pancreas: No focal lesion. Normal pancreatic contour. No surrounding inflammatory changes. No main pancreatic ductal dilatation. Spleen: Normal in size without focal abnormality. Adrenals/Urinary Tract: No adrenal nodule bilaterally. Nonspecific bilateral trace perinephric stranding. No nephrolithiasis, no hydronephrosis, and no contour-deforming renal mass. No ureterolithiasis or hydroureter. The urinary bladder is unremarkable. Stomach/Bowel: Stomach is decompressed and within normal limits. No evidence of bowel wall thickening or dilatation. No pneumatosis. The appendix is not definitely identified. Vascular/Lymphatic: No abdominal aorta or iliac aneurysm. Mild atherosclerotic plaque of the aorta and its branches. No abdominal, pelvic, or inguinal lymphadenopathy. Reproductive: Status post hysterectomy. No adnexal masses. Other: Trace nonspecific left pericolic gutter fat stranding. Trace nonspecific bilateral  perinephric fat stranding. Trace nonspecific pelvic fat stranding. No intraperitoneal free fluid. No intraperitoneal free gas. No organized fluid collection. Musculoskeletal: No abdominal wall hernia or abnormality. No suspicious lytic or blastic osseous lesions. No acute displaced fracture. Multilevel degenerative changes of the spine. IMPRESSION: 1.  No acute intra-abdominal or intrapelvic abnormality on this noncontrast study. 2. Hepatomegaly. 3.  Aortic Atherosclerosis (ICD10-I70.0). Electronically Signed   By: Iven Finn M.D.   On: 04/02/2020 22:03   DG Chest Port 1 View  Result Date: 04/02/2020 CLINICAL DATA:  Questionable sepsis - evaluate for abnormality EXAM: PORTABLE CHEST 1 VIEW COMPARISON:  10/02/2019 chest radiograph and prior. FINDINGS: Mild hypoinflation. No focal consolidation, pneumothorax or pleural effusion. Cardiomediastinal silhouette within normal limits. No acute osseous abnormality. IMPRESSION: No focal consolidation. Electronically Signed   By: Primitivo Gauze M.D.   On: 04/02/2020 11:11   DG Foot Complete Left  Result Date: 04/02/2020 CLINICAL DATA:  Diabetic with foot wound. EXAM: LEFT FOOT - COMPLETE 3+ VIEW COMPARISON:  02/21/2020 FINDINGS: Similar appearance. Previous amputation of the great toe. Chronic subluxation at the metatarsal phalangeal joint of the second toe. Widespread regional soft tissue swelling with periosteal thickening the metatarsals. Chronic osteomyelitis not excluded. No destructive osseous change. Chronic flatfoot. IMPRESSION: Previous amputation of the great toe. Widespread regional soft tissue swelling with periosteal thickening the metatarsals. Chronic osteomyelitis not excluded, particularly of the second metatarsal. No destructive osseous change seen. Chronic dislocation at the second metatarsal phalangeal joint. No change since the previous radiographs five weeks ago. Electronically Signed   By: Nelson Chimes M.D.   On: 04/02/2020 11:19         Scheduled Meds:  atorvastatin  20 mg Oral QHS   benztropine  0.5 mg Oral QHS   benztropine  1 mg Oral Daily   busPIRone  20 mg Oral TID   cyanocobalamin  1,000 mcg Subcutaneous Daily   gabapentin  600 mg Oral BID   heparin  5,000 Units Subcutaneous Q8H   insulin aspart  0-20 Units Subcutaneous TID WC   insulin aspart  0-5 Units Subcutaneous QHS   insulin NPH Human  50 Units Subcutaneous TID AC   lamoTRIgine  200 mg Oral Daily   metoCLOPramide (REGLAN) injection  5 mg Intravenous Q8H   pantoprazole  40 mg Oral Q0600   QUEtiapine  100 mg Oral QHS   Continuous Infusions:  azithromycin 500 mg (04/03/20 1727)   cefTRIAXone (ROCEPHIN)  IV Stopped (04/03/20 1800)     LOS: 2 days    Time spent: 35 minutes    Irine Seal, MD [facility]   To contact the attending provider between 7A-7P or the covering provider during after hours 7P-7A, please log into the web site [email or web address] and access using universal [place] password for that web site. If you do not have the password, please call the hospital operator.  04/04/2020, 11:03 AM

## 2020-04-05 DIAGNOSIS — E1165 Type 2 diabetes mellitus with hyperglycemia: Secondary | ICD-10-CM

## 2020-04-05 DIAGNOSIS — E1139 Type 2 diabetes mellitus with other diabetic ophthalmic complication: Secondary | ICD-10-CM

## 2020-04-05 DIAGNOSIS — R4182 Altered mental status, unspecified: Secondary | ICD-10-CM

## 2020-04-05 DIAGNOSIS — L03116 Cellulitis of left lower limb: Secondary | ICD-10-CM

## 2020-04-05 LAB — BASIC METABOLIC PANEL
Anion gap: 10 (ref 5–15)
BUN: 42 mg/dL — ABNORMAL HIGH (ref 8–23)
CO2: 24 mmol/L (ref 22–32)
Calcium: 8.5 mg/dL — ABNORMAL LOW (ref 8.9–10.3)
Chloride: 101 mmol/L (ref 98–111)
Creatinine, Ser: 1.95 mg/dL — ABNORMAL HIGH (ref 0.44–1.00)
GFR, Estimated: 28 mL/min — ABNORMAL LOW (ref >60)
Glucose, Bld: 199 mg/dL — ABNORMAL HIGH (ref 70–99)
Potassium: 4 mmol/L (ref 3.5–5.1)
Sodium: 135 mmol/L (ref 135–145)

## 2020-04-05 LAB — GLUCOSE, CAPILLARY
Glucose-Capillary: 160 mg/dL — ABNORMAL HIGH (ref 70–99)
Glucose-Capillary: 152 mg/dL — ABNORMAL HIGH (ref 70–99)
Glucose-Capillary: 236 mg/dL — ABNORMAL HIGH (ref 70–99)
Glucose-Capillary: 238 mg/dL — ABNORMAL HIGH (ref 70–99)

## 2020-04-05 LAB — CBC
HCT: 26.2 % — ABNORMAL LOW (ref 36.0–46.0)
Hemoglobin: 8.5 g/dL — ABNORMAL LOW (ref 12.0–15.0)
MCH: 32.3 pg (ref 26.0–34.0)
MCHC: 32.4 g/dL (ref 30.0–36.0)
MCV: 99.6 fL (ref 80.0–100.0)
Platelets: 342 10*3/uL (ref 150–400)
RBC: 2.63 MIL/uL — ABNORMAL LOW (ref 3.87–5.11)
RDW: 13.3 % (ref 11.5–15.5)
WBC: 10.3 10*3/uL (ref 4.0–10.5)
nRBC: 0.2 % (ref 0.0–0.2)

## 2020-04-05 MED ORDER — AZITHROMYCIN 250 MG PO TABS
500.0000 mg | ORAL_TABLET | Freq: Every day | ORAL | Status: DC
  Administered 2020-04-05: 500 mg via ORAL
  Filled 2020-04-05: qty 2

## 2020-04-05 MED ORDER — CEFPODOXIME PROXETIL 200 MG PO TABS: 200.0000 mg | ORAL_TABLET | Freq: Two times a day (BID) | ORAL | 0 refills | Status: AC

## 2020-04-05 MED ORDER — METOCLOPRAMIDE HCL 5 MG PO TABS: 5.0000 mg | ORAL_TABLET | Freq: Three times a day (TID) | ORAL | 0 refills | Status: DC

## 2020-04-05 MED ORDER — CEFPODOXIME PROXETIL 200 MG PO TABS
200.0000 mg | ORAL_TABLET | Freq: Two times a day (BID) | ORAL | Status: DC
  Administered 2020-04-05: 200 mg via ORAL
  Filled 2020-04-05 (×3): qty 1

## 2020-04-05 MED ORDER — AZITHROMYCIN 500 MG PO TABS: 500.0000 mg | ORAL_TABLET | Freq: Every day | ORAL | 0 refills | Status: AC

## 2020-04-05 MED ORDER — ASPIRIN 81 MG PO TBEC: 81.0000 mg | DELAYED_RELEASE_TABLET | Freq: Every day | ORAL | 12 refills | Status: AC

## 2020-04-05 MED ORDER — VITAMIN B-12 1000 MCG PO TABS: 1000.0000 ug | ORAL_TABLET | Freq: Every day | ORAL | Status: AC

## 2020-04-05 NOTE — Discharge Summary (Signed)
Physician Discharge Summary  Malayshia B Martinique FFM:[id number] DOB: June 15, 1955 DOA: 04/02/2020  PCP: [name], DO  Admit date: 04/02/2020 Discharge date: 04/05/2020  Time spent: 50 minutes  Recommendations for Outpatient Follow-up:  1. Follow-up with [name], DO in 2 weeks.  On follow-up patient will need a CBC done to follow-up on H&H.  Patient will need basic metabolic profile done to follow-up on electrolytes and renal function.  Patient will need vitamin B12 levels checked. 2. Follow-up with [facility]. [facility] as previously scheduled.  Patient's left heel wound will need to be followed up upon.  Patient will need repeat plain films of the left foot in about 6 weeks to follow-up on concerns for chronic osteomyelitis. 3. Follow-up with Dr. Chalmers Cater, endocrinology as previously scheduled.   Discharge Diagnoses:  Active Problems:   Bipolar disorder (HCC)   Encephalopathy, metabolic   AMS (altered mental status)   Generalized abdominal pain   Benign fundic gland polyps of stomach   Low vitamin B12 level   E. coli UTI   AKI (acute kidney injury) ([place])   Cellulitis of left lower extremity   Discharge Condition: Stable and improved  Diet recommendation: Carb modified diet  Filed Weights   04/03/20 0500 04/03/20 0929 04/04/20 0500  Weight: 96.2 kg 96.2 kg 98.6 kg    History of present illness:  HPI Per Dr. Kyle Kyriana B Martinique is a 64 y.o. female with medical history significant of DM, bipolar, HLD. Presenting with N/V, lethargy. She reports that he N/V started yesterday. It happened any time she tried to eat or drink. She did not try any medications to help. Her N/V continued through this morning, when she was taken to the ED. She denies any fever, diarrhea, constipation. She does note that he urine seems more concentrated and smelly.   Of note, she recently had a cat scratch/bite that she states hasn't been examined by anyone. She states that the lacerations on her  LLE are from where a dog and cat were fighting around her a week ago. The surrounding erythema has increased over the last few days.     ED Course: In the ED, her glucose was found to be 500. She was given fluids. She was found to have a slight leukocytosis. XR of foot showed chronic osteomyelitis. She was started on rocephin for presumed UTI. TRH was called for admission.  Hospital Course:  1 hematemesis Patient presented hematemesis with nausea vomiting coffee-ground emesis.  GI consulted patient underwent upper endoscopy which showed gastric polyps.  GI recommended to continue PPI daily and advance diet as tolerated.  Patient placed on a clear liquid diet which she tolerated and diet advanced to full liquid diet and subsequently a carb modified diet which patient tolerated.  Hemoglobin remained stable throughout the hospitalization.  Outpatient follow-up with PCP.   2.  Cellulitis of left lower extremity with chronic left heel wounds Plain films done concerning for chronic osteomyelitis.  MRI of the left foot ordered however patient refused unable to tolerate.  Due to concerns for possible cat scratch disease azithromycin added to IV Rocephin.  Wound care consulted.  Podiatry also consulted due to concerns for chronic osteomyelitis.  Patient seen in consultation by Dr. Amalia Hailey who reviewed prior films and feel no significant changes from previous radiographs from 5 weeks ago.  Per podiatry no need for any additional testing.  Silvadene cream daily recommended to left posterior heel, podiatry recommended completion of a course of antibiotic treatment with  close follow-up x-rays in about 6 weeks.  Outpatient follow-up with the wound care clinic.  3.  E. coli UTI Urine cultures with greater than 100,000 colonies of E. coli.  Patient received IV Rocephin during the hospitalization will be discharged home on oral Vantin.  Outpatient follow-up.  4.  Low vitamin B12 levels Vitamin B12 supplementation  during the hospitalization and patient be discharged on oral vitamin B12 supplementation..  Outpatient follow-up.  5.  diabetes mellitus type 2 Hemoglobin A1c 6.9 (04/02/2020).    Patient initially was on clear liquids and diet subsequently advanced to a carb modified diet which she tolerated.  Once patient was tolerating full liquid diet patient was placed on NPH 50 units 3 times daily and dose uptitrated back to home regimen of 75 units for every morning and every afternoon and 50 units nightly.  Patient was also maintained on IV Reglan and will be discharged home on 2 weeks of oral Reglan.  Patient's oral hypoglycemic agents were held during the hospitalization will be resumed on discharge.  Patient also maintained on sliding scale insulin.  Outpatient follow-up with PCP and endocrinology as previously scheduled.   6.  History of bipolar disorder Patient was maintained on home regimen of Cogentin, BuSpar, Lamictal, Seroquel.  Remained stable throughout the hospitalization. Outpatient follow-up.  7.  Hypertension Patient's home regimen Toprol-XL was resumed during the hospitalization.  Outpatient follow-up.   8.  Hyperlipidemia Patient maintained on home regimen statin.   9.  Anemia of chronic disease H&H remained stable.  Follow.  10.  Acute kidney injury Baseline creatinine approximately 1.4-1.7.  On admission creatinine noted to be 2.1.  Improved with gentle hydration and holding diuretics and other nephrotoxic agents.  Renal function improved and was close to baseline by day of discharge.  Outpatient follow-up with PCP.     Procedures:  CT abdomen and pelvis 04/02/2020  Plain films of the left foot 04/02/2020  Chest x-ray 04/02/2020  Upper endoscopy 04/03/2020 per Dr. Bryan Lemma   Consultations:  GI: Dr. Bryan Lemma 04/03/2020  Podiatry: Dr. Amalia Hailey 04/04/2020   Discharge Exam: Vitals:   04/04/20 2245 04/05/20 0603  BP: (!) 147/71 (!) 143/80  Pulse: 81 80   Resp:    Temp:  97.7 F (36.5 C)  SpO2: 98% 96%    General: NAD Cardiovascular: RRR Respiratory: CTAB  Discharge Instructions   Discharge Instructions    Diet Carb Modified   Complete by: As directed    Discharge wound care:   Complete by: As directed    As above   Increase activity slowly   Complete by: As directed      Allergies as of 04/05/2020      Reactions   Ativan [lorazepam] Anaphylaxis      Codeine Itching, Other (See Comments)   hallucinations   Demerol [meperidine] Anaphylaxis   Keflex [cephalexin] Shortness Of Breath, Rash   Tremors   Lithium Nausea And Vomiting, Other (See Comments)   Can not keep this medication down. It makes her terribly ill.   Doxycycline Other (See Comments)   Severe muscle tremor   Oxycodone Other (See Comments)   Other reaction(s): OTHER Abnormal behavior   Darvocet [propoxyphene N-acetaminophen] Itching   Latex Itching   Other reaction(s): OTHER   Propoxyphene Itching      Medication List    TAKE these medications   aspirin 81 MG EC tablet Take 1 tablet (81 mg total) by mouth daily. Swallow whole. Start taking on: April 12, 2020 What  changed: These instructions start on April 12, 2020. If you are unsure what to do until then, ask your doctor or other care provider.   atorvastatin 20 MG tablet Commonly known as: LIPITOR Take 1 tablet (20 mg total) by mouth at bedtime.   azithromycin 500 MG tablet Commonly known as: ZITHROMAX Take 1 tablet (500 mg total) by mouth daily for 4 days. Start taking on: April 06, 2020   BD Veo Insulin Syringe U/F 31G X 15/64" 1 ML Misc Generic drug: Insulin Syringe-Needle U-100 Administer insulin up 3 times per day per sliding scale E.11.9   benztropine 1 MG tablet Commonly known as: COGENTIN Take 0.5-1 mg by mouth See admin instructions. Takes 1 mg in the night and 0.5 mg at night   busPIRone 10 MG tablet Commonly known as: BUSPAR Take 20 mg by mouth 3 (three) times  daily. What changed: Another medication with the same name was removed. Continue taking this medication, and follow the directions you see here.   cefpodoxime 200 MG tablet Commonly known as: VANTIN Take 1 tablet (200 mg total) by mouth every 12 (twelve) hours for 5 days.   CENTRUM ADULTS PO Take 1 tablet by mouth daily.   famotidine 20 MG tablet Commonly known as: PEPCID Take 20 mg by mouth daily.   freestyle lancets Use as instructed to check blood sugar 3 times per day dx code E11.39   FreeStyle Libre 14 Day Sensor Misc SMARTSIG:1 Each Topical Every 2 Weeks   furosemide 40 MG tablet Commonly known as: LASIX Take 40 mg by mouth 2 (two) times daily. What changed: Another medication with the same name was removed. Continue taking this medication, and follow the directions you see here.   gabapentin 600 MG tablet Commonly known as: NEURONTIN Take 600 mg by mouth 2 (two) times daily. What changed: Another medication with the same name was removed. Continue taking this medication, and follow the directions you see here.   glucose blood test strip Commonly known as: FREESTYLE LITE USE AS INSTRUCTED TO CHECK BLOOD SUGAR 3 TIMES DAILY. DX:E11.65   HumuLIN R U-500 KwikPen 500 UNIT/ML kwikpen Generic drug: insulin regular human CONCENTRATED Inject 50 Units into the skin 3 (three) times daily with meals.   Iron 325 (65 Fe) MG Tabs Take 325 mg by mouth daily.   lamoTRIgine 200 MG tablet Commonly known as: LAMICTAL Take 200 mg by mouth daily.   metFORMIN 500 MG 24 hr tablet Commonly known as: GLUCOPHAGE-XR TAKE 2 TABLETS IN THE MORNING AND 2 TABLETS AT NIGHT. What changed:   how much to take  how to take this  when to take this  additional instructions   metoCLOPramide 5 MG tablet Commonly known as: Reglan Take 1 tablet (5 mg total) by mouth 3 (three) times daily before meals for 14 days.   metoprolol succinate 25 MG 24 hr tablet Commonly known as: TOPROL-XL Take  1 tablet (25 mg total) by mouth daily.   NovoLIN N ReliOn 100 UNIT/ML injection Generic drug: insulin NPH Human INJECT 60 UNITS AM and 50 units PM SUBCUTANEOUSLY TWICE DAILY FOR 30 DAYS What changed:   how much to take  how to take this  when to take this  additional instructions   pantoprazole 40 MG tablet Commonly known as: PROTONIX Take 1 tablet (40 mg total) by mouth daily.   PREVAGEN PO Take 1 capsule by mouth daily.   QUEtiapine 100 MG tablet Commonly known as: SEROQUEL Take 1 tablet (100 mg  total) by mouth at bedtime.   Skin Tac Adhesive Barrier Wipe Misc 1 each by Does not apply route daily.   vitamin B-12 1000 MCG tablet Commonly known as: CYANOCOBALAMIN Take 1 tablet (1,000 mcg total) by mouth daily.            Discharge Care Instructions  (From admission, onward)         Start     Ordered   04/05/20 0000  Discharge wound care:       Comments: As above   04/05/20 1314         Allergies  Allergen Reactions   Ativan [Lorazepam] Anaphylaxis        Codeine Itching and Other (See Comments)    hallucinations   Demerol [Meperidine] Anaphylaxis   Keflex [Cephalexin] Shortness Of Breath and Rash    Tremors   Lithium Nausea And Vomiting and Other (See Comments)    Can not keep this medication down. It makes her terribly ill.   Doxycycline Other (See Comments)    Severe muscle tremor   Oxycodone Other (See Comments)    Other reaction(s): OTHER Abnormal behavior   Darvocet [Propoxyphene N-Acetaminophen] Itching   Latex Itching    Other reaction(s): OTHER   Propoxyphene Itching    Follow-up Information    [name], DO. Schedule an appointment as soon as possible for a visit in 2 week(s).   Specialty: Family Medicine Why: f/u in 2 weeks. Contact information: [address] [place] [address] [phone]        Lenell Antu, MD Follow up.   Specialty: Student Why: Follow-up as scheduled at the wound  care center. Contact information: [place] [address] [phone]        Wound care center Follow up.   Why: Follow-up as scheduled at the wound care center.       Jacelyn Pi, MD Follow up.   Specialty: Endocrinology Why: Follow-up as previously scheduled. Contact information: [address] [place] [address] [phone]                The results of significant diagnostics from this hospitalization (including imaging, microbiology, ancillary and laboratory) are listed below for reference.    Significant Diagnostic Studies: CT ABDOMEN PELVIS WO CONTRAST  Result Date: 04/02/2020 CLINICAL DATA:  Gastrointestinal bleed. Four episodes of coffee ground emesis. EXAM: CT ABDOMEN AND PELVIS WITHOUT CONTRAST TECHNIQUE: Multidetector CT imaging of the abdomen and pelvis was performed following the standard protocol without IV contrast. COMPARISON:  CT abdomen pelvis 09/26/2019. FINDINGS: Lower chest: Bibasilar linear atelectasis versus scarring. Left lower lobe calcification (2:12). Left anterior descending coronary artery calcifications. No acute abnormality. Hepatobiliary: No focal liver abnormality. Layering hyperdensity within the gallbladder lumen likely represents tiny calcified gallstones. No gallbladder wall thickening or pericholecystic fluid. No biliary dilatation. Pancreas: No focal lesion. Normal pancreatic contour. No surrounding inflammatory changes. No main pancreatic ductal dilatation. Spleen: Normal in size without focal abnormality. Adrenals/Urinary Tract: No adrenal nodule bilaterally. Nonspecific bilateral trace perinephric stranding. No nephrolithiasis, no hydronephrosis, and no contour-deforming renal mass. No ureterolithiasis or hydroureter. The urinary bladder is unremarkable. Stomach/Bowel: Stomach is decompressed and within normal limits. No evidence of bowel wall thickening or dilatation. No pneumatosis. The  appendix is not definitely identified. Vascular/Lymphatic: No abdominal aorta or iliac aneurysm. Mild atherosclerotic plaque of the aorta and its branches. No abdominal, pelvic, or inguinal lymphadenopathy. Reproductive: Status post hysterectomy. No adnexal masses. Other: Trace nonspecific left  pericolic gutter fat stranding. Trace nonspecific bilateral perinephric fat stranding. Trace nonspecific pelvic fat stranding. No intraperitoneal free fluid. No intraperitoneal free gas. No organized fluid collection. Musculoskeletal: No abdominal wall hernia or abnormality. No suspicious lytic or blastic osseous lesions. No acute displaced fracture. Multilevel degenerative changes of the spine. IMPRESSION: 1. No acute intra-abdominal or intrapelvic abnormality on this noncontrast study. 2. Hepatomegaly. 3.  Aortic Atherosclerosis (ICD10-I70.0). Electronically Signed   By: Iven Finn M.D.   On: 04/02/2020 22:03   DG Chest Port 1 View  Result Date: 04/02/2020 CLINICAL DATA:  Questionable sepsis - evaluate for abnormality EXAM: PORTABLE CHEST 1 VIEW COMPARISON:  10/02/2019 chest radiograph and prior. FINDINGS: Mild hypoinflation. No focal consolidation, pneumothorax or pleural effusion. Cardiomediastinal silhouette within normal limits. No acute osseous abnormality. IMPRESSION: No focal consolidation. Electronically Signed   By: Primitivo Gauze M.D.   On: 04/02/2020 11:11   DG Foot Complete Left  Result Date: 04/02/2020 CLINICAL DATA:  Diabetic with foot wound. EXAM: LEFT FOOT - COMPLETE 3+ VIEW COMPARISON:  02/21/2020 FINDINGS: Similar appearance. Previous amputation of the great toe. Chronic subluxation at the metatarsal phalangeal joint of the second toe. Widespread regional soft tissue swelling with periosteal thickening the metatarsals. Chronic osteomyelitis not excluded. No destructive osseous change. Chronic flatfoot. IMPRESSION: Previous amputation of the great toe. Widespread regional soft tissue  swelling with periosteal thickening the metatarsals. Chronic osteomyelitis not excluded, particularly of the second metatarsal. No destructive osseous change seen. Chronic dislocation at the second metatarsal phalangeal joint. No change since the previous radiographs five weeks ago. Electronically Signed   By: Nelson Chimes M.D.   On: 04/02/2020 11:19    Microbiology: Recent Results (from the past 240 hour(s))  Culture, blood (routine x 2)     Status: None (Preliminary result)   Collection Time: 04/02/20  9:55 AM   Specimen: BLOOD  Result Value Ref Range Status   Specimen Description   Final    BLOOD RIGHT ANTECUBITAL Performed at [facility], [address] [place], [place] [address]    Special Requests   Final    BOTTLES DRAWN AEROBIC AND ANAEROBIC Blood Culture adequate volume Performed at [facility], [address] [place], [place] [address]    Culture   Final    NO GROWTH 3 DAYS Performed at [facility], [place] [address] [place], [place] [address]    Report Status PENDING  Incomplete  Culture, blood (routine x 2)     Status: None (Preliminary result)   Collection Time: 04/02/20 10:50 AM   Specimen: BLOOD LEFT HAND  Result Value Ref Range Status   Specimen Description   Final    BLOOD LEFT HAND Performed at [facility], [address] [place], [place] [address]    Special Requests   Final    BOTTLES DRAWN AEROBIC AND ANAEROBIC Blood Culture adequate volume Performed at [facility], [address] [place], [place] [address]    Culture   Final    NO GROWTH 3 DAYS Performed at [facility], [place] [address], [place], [place] [address]    Report Status PENDING  Incomplete  Resp Panel by RT-PCR (Flu A&B, Covid) Nasopharyngeal Swab     Status: None   Collection Time: 04/02/20 11:52 AM   Specimen: Nasopharyngeal Swab; Nasopharyngeal(NP) swabs in vial transport medium  Result Value Ref Range Status   SARS Coronavirus  2 by RT PCR  NEGATIVE NEGATIVE Final    Comment: (NOTE) SARS-CoV-2 target nucleic acids are NOT DETECTED.  The SARS-CoV-2 RNA is generally detectable in upper respiratory specimens during the acute phase of infection. The lowest concentration of SARS-CoV-2 viral copies this assay can detect is 138 copies/mL. A negative result does not preclude SARS-Cov-2 infection and should not be used as the sole basis for treatment or other patient management decisions. A negative result may occur with  improper specimen collection/handling, submission of specimen other than nasopharyngeal swab, presence of viral mutation(s) within the areas targeted by this assay, and inadequate number of viral copies(<138 copies/mL). A negative result must be combined with clinical observations, patient history, and epidemiological information. The expected result is Negative.  Fact Sheet for Patients:  EntrepreneurPulse.com.au  Fact Sheet for Healthcare Providers:  IncredibleEmployment.be  This test is no t yet approved or cleared by the [place] FDA and  has been authorized for detection and/or diagnosis of SARS-CoV-2 by FDA under an Emergency Use Authorization (EUA). This EUA will remain  in effect (meaning this test can be used) for the duration of the COVID-19 declaration under Section 564(b)(1) of the Act, 21 U.S.C.section 360bbb-3(b)(1), unless the authorization is terminated  or revoked sooner.       Influenza A by PCR NEGATIVE NEGATIVE Final   Influenza B by PCR NEGATIVE NEGATIVE Final    Comment: (NOTE) The Xpert Xpress SARS-CoV-2/FLU/RSV plus assay is intended as an aid in the diagnosis of influenza from Nasopharyngeal swab specimens and should not be used as a sole basis for treatment. Nasal washings and aspirates are unacceptable for Xpert Xpress SARS-CoV-2/FLU/RSV testing.  Fact Sheet for Patients: EntrepreneurPulse.com.au  Fact  Sheet for Healthcare Providers: IncredibleEmployment.be  This test is not yet approved or cleared by the [place] FDA and has been authorized for detection and/or diagnosis of SARS-CoV-2 by FDA under an Emergency Use Authorization (EUA). This EUA will remain in effect (meaning this test can be used) for the duration of the COVID-19 declaration under Section 564(b)(1) of the Act, 21 U.S.C. section 360bbb-3(b)(1), unless the authorization is terminated or revoked.  Performed at [facility], [address] [place], [place] [address]   Urine culture     Status: Abnormal   Collection Time: 04/02/20 12:41 PM   Specimen: In/Out Cath Urine  Result Value Ref Range Status   Specimen Description   Final    IN/OUT CATH URINE Performed at [facility], [address] [place], [place] [address]    Special Requests   Final    NONE Performed at [facility], [address] [place], [place] [address]    Culture >=100,000 COLONIES/mL ESCHERICHIA COLI (A)  Final   Report Status 04/04/2020 FINAL  Final   Organism ID, Bacteria ESCHERICHIA COLI (A)  Final      Susceptibility   Escherichia coli - MIC*    AMPICILLIN >=32 RESISTANT Resistant     CEFAZOLIN <=4 SENSITIVE Sensitive     CEFEPIME <=0.12 SENSITIVE Sensitive     CEFTRIAXONE <=0.25 SENSITIVE Sensitive     CIPROFLOXACIN <=0.25 SENSITIVE Sensitive     GENTAMICIN <=1 SENSITIVE Sensitive     IMIPENEM <=0.25 SENSITIVE Sensitive     NITROFURANTOIN 32 SENSITIVE Sensitive     TRIMETH/SULFA <=20 SENSITIVE Sensitive     AMPICILLIN/SULBACTAM 16 INTERMEDIATE Intermediate     PIP/TAZO <=4 SENSITIVE Sensitive     * >=100,000 COLONIES/mL ESCHERICHIA COLI  Labs: Basic Metabolic Panel: Recent Labs  Lab 04/02/20 0957 04/02/20 1744 04/03/20 0350 04/04/20 0419 04/05/20 0351  NA 134*  --  140 143 135  K 4.2  --  3.7 3.8 4.0  CL 96*  --  102 105 101  CO2 26  --  25 25 24   GLUCOSE 516*   --  371* 177* 199*  BUN 48*  --  46* 42* 42*  CREATININE 2.29* 2.12* 1.95* 1.77* 1.95*  CALCIUM 8.8*  --  8.4* 9.0 8.5*   Liver Function Tests: Recent Labs  Lab 04/02/20 0957 04/03/20 0350  AST 17 16  ALT 24 21  ALKPHOS 92 86  BILITOT 0.7 0.6  PROT 7.8 7.5  ALBUMIN 3.5 3.2*   Recent Labs  Lab 04/02/20 0957  LIPASE 19   No results for input(s): AMMONIA in the last 168 hours. CBC: Recent Labs  Lab 04/02/20 0957 04/02/20 1744 04/03/20 0350 04/04/20 0419 04/05/20 0351  WBC 12.8* 12.6* 13.1* 12.4* 10.3  NEUTROABS 10.2*  --  10.1*  --   --   HGB 9.6* 9.3* 8.6* 9.8* 8.5*  HCT 28.9* 28.5* 26.6* 30.8* 26.2*  MCV 97.0 100.4* 100.4* 101.7* 99.6  PLT 337 311 328 351 342   Cardiac Enzymes: No results for input(s): CKTOTAL, CKMB, CKMBINDEX, TROPONINI in the last 168 hours. BNP: BNP (last 3 results) No results for input(s): BNP in the last 8760 hours.  ProBNP (last 3 results) No results for input(s): PROBNP in the last 8760 hours.  CBG: Recent Labs  Lab 04/04/20 1639 04/04/20 2223 04/05/20 0600 04/05/20 0805 04/05/20 1113  GLUCAP 309* 317* 160* 152* 236*       Signed:  Irine Seal MD.  [facility] 04/05/2020, 1:45 PM

## 2020-04-05 NOTE — Discharge Instructions (Signed)

## 2020-04-05 NOTE — TOC Transition Note (Addendum)
Transition of Care [facility]) - CM/SW Discharge Note   Patient Details  Name: Dana [name] MRN: [id number] Date of Birth: 02-19-1956  Transition of Care [facility]) CM/SW Contact:  Ross Ludwig, LCSW Phone Number: 04/05/2020, 3:54 PM   Clinical Narrative:     2:20pm  CSW was informed that patient needs EMS transport back home.  Patient's insurance company has to approve.  CSW attempted to call [employer] and left a message to call back. Waiting for call back.  CSW notified leadership Janett Billow that CSW was waiting to hear back from [employer].  2:25 CSW spoke to patient and informed her that her insurance company has to approve EMS transport.  CSW informed her that if they don't approve her, she may have a bill to pay.  Patient expressed understanding, and stated she does not have any other way to get home, and her son will be available at her house when she arrives home.  CSW confirmed patient's address.  2:45pm CSW Received phone call back from [place] at [employer], she stated prior authorization form needs to be completed and faxed to [phone].  CSW informed her that patient is ready for discharge today, and patient needs to be reviewed as soon as possible for patient to discharge home.  3:39pm Prior authorization form was completed and faxed to [employer] and attention to [place] was written per her request.  Confirmation sheet was received that fax went through at 3:40pm.  CSW awaitng authorization for EMS transport.  4:30pm  CSW received phone call back from [place], that EMS transport has been approved authorization number [id number].  Patient will be going home via EMS.  CSW contacted EMS transport to schedule pickup for patient.    Final next level of care: Home/Self Care Barriers to Discharge: Barriers Resolved   Patient Goals and CMS Choice Patient states their goals for this hospitalization and ongoing recovery are:: To  return back home. CMS Medicare.gov Compare Post Acute Care list provided to:: Patient Choice offered to / list presented to : Patient  Discharge Placement  Patient plans to discharge back home, where her son will meet her.               Discharge Plan and Services  Patient discharging back home with self-care.                                   Social Determinants of Health (SDOH) Interventions     Readmission Risk Interventions No flowsheet data found.

## 2020-04-05 NOTE — Plan of Care (Signed)

## 2020-04-07 LAB — CULTURE, BLOOD (ROUTINE X 2)
Culture: NO GROWTH
Culture: NO GROWTH
Special Requests: ADEQUATE
Special Requests: ADEQUATE

## 2020-04-09 DIAGNOSIS — E11610 Type 2 diabetes mellitus with diabetic neuropathic arthropathy: Secondary | ICD-10-CM | POA: Diagnosis not present

## 2020-04-09 DIAGNOSIS — L97422 Non-pressure chronic ulcer of left heel and midfoot with fat layer exposed: Secondary | ICD-10-CM | POA: Diagnosis not present

## 2020-04-09 DIAGNOSIS — Z7984 Long term (current) use of oral hypoglycemic drugs: Secondary | ICD-10-CM | POA: Diagnosis not present

## 2020-04-09 DIAGNOSIS — Z794 Long term (current) use of insulin: Secondary | ICD-10-CM | POA: Diagnosis not present

## 2020-04-09 DIAGNOSIS — E11621 Type 2 diabetes mellitus with foot ulcer: Secondary | ICD-10-CM | POA: Diagnosis not present

## 2020-04-09 DIAGNOSIS — E1165 Type 2 diabetes mellitus with hyperglycemia: Secondary | ICD-10-CM | POA: Diagnosis not present

## 2020-04-09 NOTE — Telephone Encounter (Signed)
Did we follow up on this patient?

## 2020-04-10 DIAGNOSIS — L89620 Pressure ulcer of left heel, unstageable: Secondary | ICD-10-CM | POA: Diagnosis not present

## 2020-04-14 NOTE — Telephone Encounter (Signed)
Yes, consult completed. - Dr. Amalia Hailey

## 2020-04-20 DIAGNOSIS — E113511 Type 2 diabetes mellitus with proliferative diabetic retinopathy with macular edema, right eye: Secondary | ICD-10-CM | POA: Diagnosis not present

## 2020-04-23 DIAGNOSIS — L97422 Non-pressure chronic ulcer of left heel and midfoot with fat layer exposed: Secondary | ICD-10-CM | POA: Diagnosis not present

## 2020-04-23 DIAGNOSIS — E1165 Type 2 diabetes mellitus with hyperglycemia: Secondary | ICD-10-CM | POA: Diagnosis not present

## 2020-04-23 DIAGNOSIS — L89623 Pressure ulcer of left heel, stage 3: Secondary | ICD-10-CM | POA: Diagnosis not present

## 2020-04-23 DIAGNOSIS — E11621 Type 2 diabetes mellitus with foot ulcer: Secondary | ICD-10-CM | POA: Diagnosis not present

## 2020-04-24 DIAGNOSIS — E1122 Type 2 diabetes mellitus with diabetic chronic kidney disease: Secondary | ICD-10-CM | POA: Diagnosis not present

## 2020-04-24 DIAGNOSIS — Z89511 Acquired absence of right leg below knee: Secondary | ICD-10-CM | POA: Diagnosis not present

## 2020-04-24 DIAGNOSIS — Z9289 Personal history of other medical treatment: Secondary | ICD-10-CM | POA: Diagnosis not present

## 2020-04-24 DIAGNOSIS — E1151 Type 2 diabetes mellitus with diabetic peripheral angiopathy without gangrene: Secondary | ICD-10-CM | POA: Diagnosis not present

## 2020-04-24 DIAGNOSIS — E1140 Type 2 diabetes mellitus with diabetic neuropathy, unspecified: Secondary | ICD-10-CM | POA: Diagnosis not present

## 2020-04-24 DIAGNOSIS — E11622 Type 2 diabetes mellitus with other skin ulcer: Secondary | ICD-10-CM | POA: Diagnosis not present

## 2020-04-24 DIAGNOSIS — Z0000 Encounter for general adult medical examination without abnormal findings: Secondary | ICD-10-CM | POA: Diagnosis not present

## 2020-04-24 DIAGNOSIS — N390 Urinary tract infection, site not specified: Secondary | ICD-10-CM | POA: Diagnosis not present

## 2020-04-27 ENCOUNTER — Other Ambulatory Visit: Payer: Self-pay | Admitting: Family Medicine

## 2020-04-27 DIAGNOSIS — Z0000 Encounter for general adult medical examination without abnormal findings: Secondary | ICD-10-CM

## 2020-04-27 DIAGNOSIS — F331 Major depressive disorder, recurrent, moderate: Secondary | ICD-10-CM | POA: Diagnosis not present

## 2020-04-27 DIAGNOSIS — F411 Generalized anxiety disorder: Secondary | ICD-10-CM | POA: Diagnosis not present

## 2020-04-30 DIAGNOSIS — R453 Demoralization and apathy: Secondary | ICD-10-CM | POA: Diagnosis not present

## 2020-04-30 DIAGNOSIS — E11621 Type 2 diabetes mellitus with foot ulcer: Secondary | ICD-10-CM | POA: Diagnosis not present

## 2020-04-30 DIAGNOSIS — Z794 Long term (current) use of insulin: Secondary | ICD-10-CM | POA: Diagnosis not present

## 2020-04-30 DIAGNOSIS — E1165 Type 2 diabetes mellitus with hyperglycemia: Secondary | ICD-10-CM | POA: Diagnosis not present

## 2020-04-30 DIAGNOSIS — L97422 Non-pressure chronic ulcer of left heel and midfoot with fat layer exposed: Secondary | ICD-10-CM | POA: Diagnosis not present

## 2020-04-30 DIAGNOSIS — L89623 Pressure ulcer of left heel, stage 3: Secondary | ICD-10-CM | POA: Diagnosis not present

## 2020-04-30 DIAGNOSIS — E11610 Type 2 diabetes mellitus with diabetic neuropathic arthropathy: Secondary | ICD-10-CM | POA: Diagnosis not present

## 2020-05-07 DIAGNOSIS — E1140 Type 2 diabetes mellitus with diabetic neuropathy, unspecified: Secondary | ICD-10-CM | POA: Diagnosis not present

## 2020-05-07 DIAGNOSIS — L97422 Non-pressure chronic ulcer of left heel and midfoot with fat layer exposed: Secondary | ICD-10-CM | POA: Diagnosis not present

## 2020-05-07 DIAGNOSIS — E11621 Type 2 diabetes mellitus with foot ulcer: Secondary | ICD-10-CM | POA: Diagnosis not present

## 2020-05-07 DIAGNOSIS — E1165 Type 2 diabetes mellitus with hyperglycemia: Secondary | ICD-10-CM | POA: Diagnosis not present

## 2020-05-07 DIAGNOSIS — L89623 Pressure ulcer of left heel, stage 3: Secondary | ICD-10-CM | POA: Diagnosis not present

## 2020-05-07 DIAGNOSIS — L97509 Non-pressure chronic ulcer of other part of unspecified foot with unspecified severity: Secondary | ICD-10-CM | POA: Diagnosis not present

## 2020-05-10 DIAGNOSIS — E1140 Type 2 diabetes mellitus with diabetic neuropathy, unspecified: Secondary | ICD-10-CM | POA: Diagnosis not present

## 2020-05-10 DIAGNOSIS — F39 Unspecified mood [affective] disorder: Secondary | ICD-10-CM | POA: Diagnosis not present

## 2020-05-10 DIAGNOSIS — G2581 Restless legs syndrome: Secondary | ICD-10-CM | POA: Diagnosis not present

## 2020-05-10 DIAGNOSIS — Z89511 Acquired absence of right leg below knee: Secondary | ICD-10-CM | POA: Diagnosis not present

## 2020-05-10 DIAGNOSIS — E7800 Pure hypercholesterolemia, unspecified: Secondary | ICD-10-CM | POA: Diagnosis not present

## 2020-05-10 DIAGNOSIS — E1122 Type 2 diabetes mellitus with diabetic chronic kidney disease: Secondary | ICD-10-CM | POA: Diagnosis not present

## 2020-05-10 DIAGNOSIS — Z794 Long term (current) use of insulin: Secondary | ICD-10-CM | POA: Diagnosis not present

## 2020-05-21 DIAGNOSIS — L89623 Pressure ulcer of left heel, stage 3: Secondary | ICD-10-CM | POA: Diagnosis not present

## 2020-05-21 DIAGNOSIS — Z89511 Acquired absence of right leg below knee: Secondary | ICD-10-CM | POA: Diagnosis not present

## 2020-05-21 DIAGNOSIS — E1165 Type 2 diabetes mellitus with hyperglycemia: Secondary | ICD-10-CM | POA: Diagnosis not present

## 2020-05-21 DIAGNOSIS — L97422 Non-pressure chronic ulcer of left heel and midfoot with fat layer exposed: Secondary | ICD-10-CM | POA: Diagnosis not present

## 2020-05-21 DIAGNOSIS — Z794 Long term (current) use of insulin: Secondary | ICD-10-CM | POA: Diagnosis not present

## 2020-05-21 DIAGNOSIS — E11621 Type 2 diabetes mellitus with foot ulcer: Secondary | ICD-10-CM | POA: Diagnosis not present

## 2020-05-25 DIAGNOSIS — E113512 Type 2 diabetes mellitus with proliferative diabetic retinopathy with macular edema, left eye: Secondary | ICD-10-CM | POA: Diagnosis not present

## 2020-06-07 DIAGNOSIS — L89623 Pressure ulcer of left heel, stage 3: Secondary | ICD-10-CM | POA: Diagnosis not present

## 2020-06-07 DIAGNOSIS — E11621 Type 2 diabetes mellitus with foot ulcer: Secondary | ICD-10-CM | POA: Diagnosis not present

## 2020-06-07 DIAGNOSIS — E1165 Type 2 diabetes mellitus with hyperglycemia: Secondary | ICD-10-CM | POA: Diagnosis not present

## 2020-06-07 DIAGNOSIS — L97422 Non-pressure chronic ulcer of left heel and midfoot with fat layer exposed: Secondary | ICD-10-CM | POA: Diagnosis not present

## 2020-06-08 ENCOUNTER — Ambulatory Visit: Payer: PPO

## 2020-06-08 DIAGNOSIS — E113511 Type 2 diabetes mellitus with proliferative diabetic retinopathy with macular edema, right eye: Secondary | ICD-10-CM | POA: Diagnosis not present

## 2020-06-21 DIAGNOSIS — L97422 Non-pressure chronic ulcer of left heel and midfoot with fat layer exposed: Secondary | ICD-10-CM | POA: Diagnosis not present

## 2020-06-21 DIAGNOSIS — E11621 Type 2 diabetes mellitus with foot ulcer: Secondary | ICD-10-CM | POA: Diagnosis not present

## 2020-06-21 DIAGNOSIS — L89623 Pressure ulcer of left heel, stage 3: Secondary | ICD-10-CM | POA: Diagnosis not present

## 2020-06-21 DIAGNOSIS — E1165 Type 2 diabetes mellitus with hyperglycemia: Secondary | ICD-10-CM | POA: Diagnosis not present

## 2020-07-20 ENCOUNTER — Emergency Department (HOSPITAL_COMMUNITY): Payer: PPO

## 2020-07-20 ENCOUNTER — Observation Stay (HOSPITAL_COMMUNITY)
Admission: EM | Admit: 2020-07-20 | Discharge: 2020-07-21 | Disposition: A | Discharge status: Home / Self Care | Payer: PPO | Attending: Student | Admitting: Student

## 2020-07-20 ENCOUNTER — Encounter (HOSPITAL_COMMUNITY): Payer: Self-pay

## 2020-07-20 ENCOUNTER — Other Ambulatory Visit: Payer: Self-pay

## 2020-07-20 ENCOUNTER — Inpatient Hospital Stay: Admission: RE | Admit: 2020-07-20 | Payer: PPO | Source: Ambulatory Visit

## 2020-07-20 DIAGNOSIS — E119 Type 2 diabetes mellitus without complications: Secondary | ICD-10-CM

## 2020-07-20 DIAGNOSIS — Z79899 Other long term (current) drug therapy: Secondary | ICD-10-CM | POA: Insufficient documentation

## 2020-07-20 DIAGNOSIS — Z7982 Long term (current) use of aspirin: Secondary | ICD-10-CM | POA: Diagnosis not present

## 2020-07-20 DIAGNOSIS — Z7984 Long term (current) use of oral hypoglycemic drugs: Secondary | ICD-10-CM | POA: Diagnosis not present

## 2020-07-20 DIAGNOSIS — Z20822 Contact with and (suspected) exposure to covid-19: Secondary | ICD-10-CM | POA: Insufficient documentation

## 2020-07-20 DIAGNOSIS — Z872 Personal history of diseases of the skin and subcutaneous tissue: Secondary | ICD-10-CM | POA: Diagnosis not present

## 2020-07-20 DIAGNOSIS — Z91040 Latex allergy status: Secondary | ICD-10-CM | POA: Insufficient documentation

## 2020-07-20 DIAGNOSIS — E1129 Type 2 diabetes mellitus with other diabetic kidney complication: Secondary | ICD-10-CM

## 2020-07-20 DIAGNOSIS — E11621 Type 2 diabetes mellitus with foot ulcer: Secondary | ICD-10-CM | POA: Diagnosis not present

## 2020-07-20 DIAGNOSIS — N184 Chronic kidney disease, stage 4 (severe): Secondary | ICD-10-CM

## 2020-07-20 DIAGNOSIS — R600 Localized edema: Secondary | ICD-10-CM | POA: Diagnosis not present

## 2020-07-20 DIAGNOSIS — I10 Essential (primary) hypertension: Secondary | ICD-10-CM | POA: Diagnosis not present

## 2020-07-20 DIAGNOSIS — G9340 Encephalopathy, unspecified: Secondary | ICD-10-CM | POA: Diagnosis not present

## 2020-07-20 DIAGNOSIS — L03116 Cellulitis of left lower limb: Secondary | ICD-10-CM | POA: Diagnosis not present

## 2020-07-20 DIAGNOSIS — Z794 Long term (current) use of insulin: Secondary | ICD-10-CM | POA: Insufficient documentation

## 2020-07-20 DIAGNOSIS — Z89511 Acquired absence of right leg below knee: Secondary | ICD-10-CM

## 2020-07-20 DIAGNOSIS — L089 Local infection of the skin and subcutaneous tissue, unspecified: Secondary | ICD-10-CM | POA: Diagnosis present

## 2020-07-20 LAB — COMPREHENSIVE METABOLIC PANEL
ALT: 31 U/L (ref 0–44)
AST: 31 U/L (ref 15–41)
Albumin: 3.8 g/dL (ref 3.5–5.0)
Alkaline Phosphatase: 91 U/L (ref 38–126)
Anion gap: 11 (ref 5–15)
BUN: 33 mg/dL — ABNORMAL HIGH (ref 8–23)
CO2: 25 mmol/L (ref 22–32)
Calcium: 9.4 mg/dL (ref 8.9–10.3)
Chloride: 101 mmol/L (ref 98–111)
Creatinine, Ser: 2.02 mg/dL — ABNORMAL HIGH (ref 0.44–1.00)
GFR, Estimated: 27 mL/min — ABNORMAL LOW (ref >60)
Glucose, Bld: 63 mg/dL — ABNORMAL LOW (ref 70–99)
Potassium: 4.3 mmol/L (ref 3.5–5.1)
Sodium: 137 mmol/L (ref 135–145)
Total Bilirubin: 0.7 mg/dL (ref 0.3–1.2)
Total Protein: 7.2 g/dL (ref 6.5–8.1)

## 2020-07-20 LAB — CBC
HCT: 31.2 % — ABNORMAL LOW (ref 36.0–46.0)
Hemoglobin: 10.4 g/dL — ABNORMAL LOW (ref 12.0–15.0)
MCH: 32.6 pg (ref 26.0–34.0)
MCHC: 33.3 g/dL (ref 30.0–36.0)
MCV: 97.8 fL (ref 80.0–100.0)
Platelets: 357 10*3/uL (ref 150–400)
RBC: 3.19 MIL/uL — ABNORMAL LOW (ref 3.87–5.11)
RDW: 13.8 % (ref 11.5–15.5)
WBC: 7.1 10*3/uL (ref 4.0–10.5)
nRBC: 0 % (ref 0.0–0.2)

## 2020-07-20 LAB — CBG MONITORING, ED
Glucose-Capillary: 69 mg/dL — ABNORMAL LOW (ref 70–99)
Glucose-Capillary: 94 mg/dL (ref 70–99)
Glucose-Capillary: 103 mg/dL — ABNORMAL HIGH (ref 70–99)

## 2020-07-20 MED ORDER — GABAPENTIN 300 MG PO CAPS
600.0000 mg | ORAL_CAPSULE | Freq: Once | ORAL | Status: AC
  Administered 2020-07-20: 600 mg via ORAL
  Filled 2020-07-20: qty 2

## 2020-07-20 MED ORDER — PIPERACILLIN-TAZOBACTAM 3.375 G IVPB 30 MIN
3.3750 g | Freq: Once | INTRAVENOUS | Status: AC
  Administered 2020-07-20: 3.375 g via INTRAVENOUS
  Filled 2020-07-20: qty 50

## 2020-07-20 NOTE — ED Notes (Signed)
Patient CBG 69, orange juice provided to patient.

## 2020-07-20 NOTE — ED Triage Notes (Signed)
Patient unsure why she is here, sister and mother brought her in. Sister states that patient has not been taking her medications and has not been taking care of herself. Patient reports some of her medications got wet and she needed her doctor to re-prescribe them. Sister also concerned that her L foot might be infected.

## 2020-07-20 NOTE — ED Notes (Signed)
Pt refusing care. Pt reports that she wants to have care done at home. Does not want to stay in hospital. [place], [place] notifed

## 2020-07-20 NOTE — ED Provider Notes (Signed)
[place] EMERGENCY DEPARTMENT Provider Note   CSN: [id number] Arrival date & time: 07/20/20  1940     History Chief Complaint  Patient presents with  . Altered Mental Status    Dana [name] is a 65 y.o. female w PMHx insulin dependent T2DM, HTN, diabetic neuropathy, left BKA, right 1st toe and metatarsal amputation, presenting to the ED for left foot infection and tremor. Patient's sister present with her, they states she lives with them and they are unsure if she takes her medications regularly.  She has not been taking her Metformin or gabapentin for the last few days because she states the medications got wet she was unable to take them.  She is waiting for refill.  She states she does have any feeling in her foot.  Has not having any pain there.  Denies fevers.   She had low blood sugar this evening, treated with food and felt better.  States she took her home insulin dose earlier today as directed, 75 units.  Reports compliance with other medications, however patient sisters question this. Patient sisters reports she has been having some tremor over the last couple of weeks.  She has had gradually worsening balance with ambulation that has been occurring over some time now.  They report she is planned to enter a nursing home on Wednesday next week for more regular assistance throughout the day.  Per chart review, right BKA was done by [facility] a few years back.  The history is provided by the patient and a relative.       Past Medical History:  Diagnosis Date  . Bipolar 1 disorder ([place])   . Cellulitis and abscess of foot 03/08/2015  . Diabetes mellitus    INSULIN DEPENDENT  . Diabetic neuropathy ([place])   . 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 H/O hiatal hernia   . Hyperlipidemia   . Hypertension    past hx of  . Iron malabsorption 09/26/2015  . Numbness and tingling    Hx; of in B/LLE and B/LUE  . Other iron deficiency anemias 09/20/2015     Patient Active Problem List   Diagnosis Date Noted  . Cellulitis of left lower extremity   . Low vitamin B12 level 04/04/2020  . E. coli UTI 04/04/2020  . AKI (acute kidney injury) ([place])   . Generalized abdominal pain   . Benign fundic gland polyps of stomach   . AMS (altered mental status) 04/02/2020  . Severe sepsis ([place]) 09/27/2019  . Acute respiratory failure with hypoxia ([place]) 09/27/2019  . Emesis 09/27/2019  . Hyperglycemia 09/27/2019  . Sepsis ([place]) 09/27/2019  . Encephalopathy, metabolic   . Coffee ground emesis   . Hyperkalemia   . Pneumonia 09/26/2019  . Partial nontraumatic amputation of left foot ([place]) 04/28/2017  . Acquired absence of right leg below knee ([place]) 04/14/2017  . Pleural effusion 01/19/2017  . Contracture of left Achilles tendon 01/16/2017  . Allergic rhinitis 01/03/2017  . Gastroesophageal reflux disease without esophagitis 01/03/2017  . Hiatal hernia 02/03/2016  . Anemia associated with diabetes mellitus ([place]) 11/07/2015  . Iron malabsorption 09/26/2015  . Other iron deficiency anemia 09/20/2015  . 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 Diabetic retinopathy associated with diabetes mellitus due to underlying condition ([place]) 03/15/2015  . DM (diabetes mellitus) type II uncontrolled with eye manifestation ([place]) 11/08/2014  . Hyperlipidemia LDL goal <100 11/08/2014  . Diabetic polyneuropathy associated with type 2 diabetes mellitus ([place]) 11/08/2014  . Partial nontraumatic  amputation of foot ([place]) 11/08/2014  . Type 2 diabetes mellitus without complication, with long-term current use of insulin ([place]) 10/24/2011  . Bipolar disorder ([place]) 10/24/2011    Past Surgical History:  Procedure Laterality Date  . ABDOMINAL HYSTERECTOMY    . ADENOIDECTOMY     Hx: of  . AMPUTATION  10/24/2011   Procedure: AMPUTATION RAY;  Surgeon: Newt Minion, MD;  Location: [place];  Service: Orthopedics;  Laterality: Right;  Right Foot 3rd Ray Amputation  .  AMPUTATION Right 12/16/2012   Procedure: Right Foot Transmetatarsal Amputation;  Surgeon: Newt Minion, MD;  Location: [place];  Service: Orthopedics;  Laterality: Right;  . AMPUTATION Left 03/09/2015   Procedure: LEFT FOOT 1ST RAY AMPUTATION;  Surgeon: Newt Minion, MD;  Location: [place];  Service: Orthopedics;  Laterality: Left;  . BLADDER SURGERY     x 2, tacked 1st time; mesh "eroded", had to be removed  . Bladder Tact   2002  . BREAST SURGERY Left    "knot removed"  . CARPAL TUNNEL RELEASE Left   . COLON SURGERY    . ESOPHAGOGASTRODUODENOSCOPY (EGD) WITH PROPOFOL N/A 04/03/2020   Procedure: ESOPHAGOGASTRODUODENOSCOPY (EGD) WITH PROPOFOL;  Surgeon: Lavena Bullion, DO;  Location: [facility];  Service: Gastroenterology;  Laterality: N/A;  . EYE SURGERY  11/17/2017  . NASAL SEPTUM SURGERY  1976  . POLYPECTOMY  04/03/2020   Procedure: POLYPECTOMY;  Surgeon: Lavena Bullion, DO;  Location: [facility];  Service: Gastroenterology;;  . TONSILLECTOMY     age 48's     OB History   No obstetric history on file.     Family History  Problem Relation Age of Onset  . Diabetes Mother   . Mental illness Mother   . Bipolar disorder Mother   . Hypertension Father   . Osteoarthritis Father   . Heart disease Father   . Diabetes Sister   . Early death Brother        MVA  . Healthy Sister   . Diabetes Maternal Grandmother     Social History   Tobacco Use  . Smoking status: Never Smoker  . Smokeless tobacco: Never Used  Vaping Use  . Vaping Use: Never used  Substance Use Topics  . Alcohol use: No    Alcohol/week: 0.0 standard drinks  . Drug use: No    Home Medications Prior to Admission medications   Medication Sig Start Date End Date Taking? Authorizing Provider  Apoaequorin (PREVAGEN PO) Take 1 capsule by mouth daily.    [provider]  aspirin 81 MG EC tablet Take 1 tablet (81 mg total) by mouth daily. Swallow whole. 04/12/20   Eugenie Filler, MD   atorvastatin (LIPITOR) 20 MG tablet Take 1 tablet (20 mg total) by mouth at bedtime. 07/20/18   Scot Jun, FNP  BD VEO INSULIN SYRINGE U/F 31G X 15/64" 1 ML MISC Administer insulin up 3 times per day per sliding scale E.11.9 07/20/18   Scot Jun, FNP  benztropine (COGENTIN) 1 MG tablet Take 0.5-1 mg by mouth See admin instructions. Takes 1 mg in the night and 0.5 mg at night    [provider]  busPIRone (BUSPAR) 10 MG tablet Take 20 mg by mouth 3 (three) times daily.    [provider]  Continuous Blood Gluc Sensor (FREESTYLE LIBRE 14 DAY SENSOR) MISC SMARTSIG:1 Each Topical Every 2 Weeks 07/17/19   [provider]  famotidine (PEPCID) 20 MG tablet Take  20 mg by mouth daily.    [provider]  Ferrous Sulfate (IRON) 325 (65 Fe) MG TABS Take 325 mg by mouth daily.     [provider]  furosemide (LASIX) 40 MG tablet Take 40 mg by mouth 2 (two) times daily.    [provider]  gabapentin (NEURONTIN) 600 MG tablet Take 600 mg by mouth 2 (two) times daily.     [provider]  glucose blood (FREESTYLE LITE) test strip USE AS INSTRUCTED TO CHECK BLOOD SUGAR 3 TIMES DAILY. DX:E11.65 07/20/18   Scot Jun, FNP  HUMULIN R U-500 KWIKPEN 500 UNIT/ML kwikpen Inject 50 Units into the skin 3 (three) times daily with meals.  07/31/19   [provider]  lamoTRIgine (LAMICTAL) 200 MG tablet Take 200 mg by mouth daily.    [provider]  Lancets (FREESTYLE) lancets Use as instructed to check blood sugar 3 times per day dx code E11.39 07/20/18   Scot Jun, FNP  metFORMIN (GLUCOPHAGE-XR) 500 MG 24 hr tablet TAKE 2 TABLETS IN THE MORNING AND 2 TABLETS AT NIGHT. Patient taking differently: Take 1,000 mg by mouth 2 (two) times daily.  12/09/17   Elayne Snare, MD  metoCLOPramide (REGLAN) 5 MG tablet Take 1 tablet (5 mg total) by mouth 3 (three) times daily before meals for 14 days. 04/05/20 04/19/20  Eugenie Filler, MD  metoprolol succinate (TOPROL-XL) 25 MG 24 hr tablet Take 1 tablet (25 mg total) by mouth daily. Patient not taking: Reported on 04/02/2020 10/03/19   Hosie Poisson, MD  Multiple Vitamins-Minerals (CENTRUM ADULTS PO) Take 1 tablet by mouth daily.    [provider]  NOVOLIN N RELION 100 UNIT/ML injection INJECT 60 UNITS AM and 50 units PM SUBCUTANEOUSLY TWICE DAILY FOR 30 DAYS Patient taking differently: Inject 50-75 Units into the skin See admin instructions. Take 75 units with breakfast, 75 units with lunch, and 50 units with supper-dose confirmed with Dr. Almetta Lovely office 07/20/18   Scot Jun, FNP  Ostomy Supplies (SKIN TAC ADHESIVE BARRIER WIPE) MISC 1 each by Does not apply route daily. 11/25/17   Elayne Snare, MD  pantoprazole (PROTONIX) 40 MG tablet Take 1 tablet (40 mg total) by mouth daily. 07/20/18   Scot Jun, FNP  QUEtiapine (SEROQUEL) 100 MG tablet Take 1 tablet (100 mg total) by mouth at bedtime. 10/02/19   Hosie Poisson, MD  vitamin B-12 (CYANOCOBALAMIN) 1000 MCG tablet Take 1 tablet (1,000 mcg total) by mouth daily. 04/05/20   Eugenie Filler, MD    Allergies    Ativan [lorazepam], Codeine, Demerol [meperidine], Keflex [cephalexin], Lithium, Doxycycline, Oxycodone, Darvocet [propoxyphene n-acetaminophen], Latex, and Propoxyphene  Review of Systems   Review of Systems  Skin: Positive for color change and wound.  Neurological: Positive for tremors.  All other systems reviewed and are negative.   Physical Exam Updated Vital Signs BP (!) 137/108   Pulse 89   Temp 97.8 F (36.6 C) (Oral)   Resp (!) 25   Ht 5\' 1"  (1.549 m)   Wt 106.6 kg   SpO2 97%   BMI 44.40 kg/m   Physical Exam Vitals and nursing note reviewed.  Constitutional:      General: She is not in acute distress.    Appearance: She is well-developed.  HENT:     Head: Normocephalic and atraumatic.  Eyes:     Conjunctiva/sclera: Conjunctivae normal.  Cardiovascular:      Rate and Rhythm: Normal rate and  regular rhythm.  Pulmonary:     Effort: Pulmonary effort is normal.     Breath sounds: Normal breath sounds.  Abdominal:     General: Bowel sounds are normal.     Palpations: Abdomen is soft.     Tenderness: There is no abdominal tenderness.  Musculoskeletal:     Comments: Right BKA. Left lower leg with edema and circumferential erythema.  The foot is significantly malodorous.  There is circumferential erythema with multiple ulcerations noted about the distal medial foot.  There is also larger more chronic appearing ulceration to the plantar aspect of the midfoot.  No drainage is expressed.  The foot is very dirty, black in color from her shoe.  See images below.   Skin:    General: Skin is warm.  Neurological:     Mental Status: She is alert.     Comments: CN grossly intact.  Patient alert and conversing, no aphasia noted. Following simple commands without difficulty. No appreciated tremor on exam.  Psychiatric:        Behavior: Behavior normal.           ED Results / Procedures / Treatments   Labs (all labs ordered are listed, but only abnormal results are displayed) Labs Reviewed  COMPREHENSIVE METABOLIC PANEL - Abnormal; Notable for the following components:      Result Value   Glucose, Bld 63 (*)    BUN 33 (*)    Creatinine, Ser 2.02 (*)    GFR, Estimated 27 (*)    All other components within normal limits  CBC - Abnormal; Notable for the following components:   RBC 3.19 (*)    Hemoglobin 10.4 (*)    HCT 31.2 (*)    All other components within normal limits  CBG MONITORING, ED - Abnormal; Notable for the following components:   Glucose-Capillary 69 (*)    All other components within normal limits  CBG MONITORING, ED - Abnormal; Notable for the following components:   Glucose-Capillary 103 (*)    All other components within normal limits  LACTIC ACID, PLASMA  LACTIC ACID, PLASMA  CBG MONITORING, ED  CBG MONITORING, ED     EKG None  Radiology CT Head Wo Contrast  Result Date: 07/20/2020 CLINICAL DATA:  Encephalopathy EXAM: CT HEAD WITHOUT CONTRAST TECHNIQUE: Contiguous axial images were obtained from the base of the skull through the vertex without intravenous contrast. COMPARISON:  05/20/2016 FINDINGS: Brain: There is no mass, hemorrhage or extra-axial collection. The size and configuration of the ventricles and extra-axial CSF spaces are normal. The brain parenchyma is normal, without acute or chronic infarction. Vascular: No abnormal hyperdensity of the major intracranial arteries or dural venous sinuses. No intracranial atherosclerosis. Skull: The visualized skull base, calvarium and extracranial soft tissues are normal. Sinuses/Orbits: No fluid levels or advanced mucosal thickening of the visualized paranasal sinuses. No mastoid or middle ear effusion. The orbits are normal. IMPRESSION: Normal head CT. Electronically Signed   By: Ulyses Jarred M.D.   On: 07/20/2020 22:39   DG Foot Complete Left  Result Date: 07/20/2020 CLINICAL DATA:  Diabetic foot infection.  Purulence with black toes EXAM: LEFT FOOT - COMPLETE 3+ VIEW COMPARISON:  Radiograph 04/02/2020 FINDINGS: Prior resection of the first ray. Chronic dislocation of the second toe at the metatarsal phalangeal joint. There is bony ankylosis of the tarsal bones and tarsal metatarsal articulation. Disordered alignment of the tarsal bones. Partial ankylosis of the proximal metatarsals. Chronic deformity of the third toe at the  metatarsal phalangeal joint may reflect prior injury. Chronic periosteal thickening of the metatarsals most prominently involving the second. No frank bony destruction. Large plantar calcaneal spur with moderate Achilles tendon enthesophyte. There is generalized soft tissue edema throughout the foot. Tiny 4 mm density in the plantar soft tissues was not seen on prior exam may represent foreign body or calcification. No tracking soft tissue  air. IMPRESSION: 1. Generalized soft tissue edema. No frank bony destruction. 2. Tiny 4 mm density in the plantar soft tissues may represent foreign body or calcification. 3. Prior resection of the first ray. Multifocal joint ankylosis consistent with Charcot foot. Chronic dislocation of the second toe. 4. Chronic periosteal thickening of metatarsals most prominently involving the second which is unchanged from prior exam. Electronically Signed   By: Keith Rake M.D.   On: 07/20/2020 22:10    Procedures Procedures   Medications Ordered in ED Medications  gabapentin (NEURONTIN) capsule 600 mg (600 mg Oral Given 07/20/20 2320)  piperacillin-tazobactam (ZOSYN) IVPB 3.375 g (3.375 g Intravenous New Bag/Given 07/20/20 2319)    ED Course  I have reviewed the triage vital signs and the nursing notes.  Pertinent labs & imaging results that were available during my care of the patient were reviewed by me and considered in my medical decision making (see chart for details).    MDM Rules/Calculators/A&P                          Patient is an insulin-dependent diabetic status post right BKA, left first toe and metatarsal amputation, presenting with significant cellulitis and ulceration to the left foot and lower leg.  No systemic symptoms.  There is also noted to be an episode of hypoglycemia today, initially low on metabolic panel though improved on repeat CBG.  Intermittent tremor is reported by patient's family and some gradual onset of difficulty with balance though this has been occurring over some time and is not acute in nature.  Patient's family expresses concern for poor medication compliance.  Also considering exam today with significant cellulitis and malodorous wounds, concern for underlying osteomyelitis.  Patient would benefit from MRI as well as IV antibiotics medical admission for further management.  Patient was discussed with and evaluated by attending physician Dr. Darl Householder, who is in  agreement with work-up and care plan for admission.  Recommends IV zosyn. She is admitted to the hospitalist service Final Clinical Impression(s) / ED Diagnoses Final diagnoses:  Cellulitis of left lower extremity    Rx / DC Orders ED Discharge Orders    None       Arasely Akkerman, [name] N, PA-C 07/20/20 2358    Drenda Freeze, MD 07/23/20 1606

## 2020-07-21 ENCOUNTER — Encounter (HOSPITAL_COMMUNITY): Payer: Self-pay | Admitting: Family Medicine

## 2020-07-21 DIAGNOSIS — Z794 Long term (current) use of insulin: Secondary | ICD-10-CM | POA: Diagnosis not present

## 2020-07-21 DIAGNOSIS — E1122 Type 2 diabetes mellitus with diabetic chronic kidney disease: Secondary | ICD-10-CM | POA: Diagnosis not present

## 2020-07-21 DIAGNOSIS — L03116 Cellulitis of left lower limb: Secondary | ICD-10-CM

## 2020-07-21 DIAGNOSIS — Z89511 Acquired absence of right leg below knee: Secondary | ICD-10-CM

## 2020-07-21 DIAGNOSIS — N184 Chronic kidney disease, stage 4 (severe): Secondary | ICD-10-CM | POA: Diagnosis not present

## 2020-07-21 DIAGNOSIS — I10 Essential (primary) hypertension: Secondary | ICD-10-CM

## 2020-07-21 DIAGNOSIS — E1129 Type 2 diabetes mellitus with other diabetic kidney complication: Secondary | ICD-10-CM

## 2020-07-21 LAB — BASIC METABOLIC PANEL
Anion gap: 11 (ref 5–15)
BUN: 27 mg/dL — ABNORMAL HIGH (ref 8–23)
CO2: 25 mmol/L (ref 22–32)
Calcium: 9.3 mg/dL (ref 8.9–10.3)
Chloride: 101 mmol/L (ref 98–111)
Creatinine, Ser: 1.87 mg/dL — ABNORMAL HIGH (ref 0.44–1.00)
GFR, Estimated: 30 mL/min — ABNORMAL LOW (ref >60)
Glucose, Bld: 207 mg/dL — ABNORMAL HIGH (ref 70–99)
Potassium: 4.2 mmol/L (ref 3.5–5.1)
Sodium: 137 mmol/L (ref 135–145)

## 2020-07-21 LAB — CBG MONITORING, ED: Glucose-Capillary: 209 mg/dL — ABNORMAL HIGH (ref 70–99)

## 2020-07-21 LAB — CBC
HCT: 30.9 % — ABNORMAL LOW (ref 36.0–46.0)
Hemoglobin: 10.3 g/dL — ABNORMAL LOW (ref 12.0–15.0)
MCH: 32.3 pg (ref 26.0–34.0)
MCHC: 33.3 g/dL (ref 30.0–36.0)
MCV: 96.9 fL (ref 80.0–100.0)
Platelets: 347 10*3/uL (ref 150–400)
RBC: 3.19 MIL/uL — ABNORMAL LOW (ref 3.87–5.11)
RDW: 13.8 % (ref 11.5–15.5)
WBC: 5.6 10*3/uL (ref 4.0–10.5)
nRBC: 0 % (ref 0.0–0.2)

## 2020-07-21 LAB — GLUCOSE, CAPILLARY
Glucose-Capillary: 163 mg/dL — ABNORMAL HIGH (ref 70–99)
Glucose-Capillary: 215 mg/dL — ABNORMAL HIGH (ref 70–99)

## 2020-07-21 LAB — HEMOGLOBIN A1C
Hgb A1c MFr Bld: 6.4 % — ABNORMAL HIGH (ref 4.8–5.6)
Mean Plasma Glucose: 136.98 mg/dL

## 2020-07-21 LAB — SARS CORONAVIRUS 2 (TAT 6-24 HRS): SARS Coronavirus 2: NEGATIVE

## 2020-07-21 MED ORDER — SENNOSIDES-DOCUSATE SODIUM 8.6-50 MG PO TABS: 1.0000 | ORAL_TABLET | Freq: Every evening | ORAL | Status: DC | PRN

## 2020-07-21 MED ORDER — INSULIN ASPART 100 UNIT/ML ~~LOC~~ SOLN: 0.0000 [IU] | Freq: Every day | SUBCUTANEOUS | Status: DC

## 2020-07-21 MED ORDER — PANTOPRAZOLE SODIUM 40 MG PO TBEC
40.0000 mg | DELAYED_RELEASE_TABLET | Freq: Every day | ORAL | Status: DC
  Administered 2020-07-21: 40 mg via ORAL
  Filled 2020-07-21: qty 1

## 2020-07-21 MED ORDER — INSULIN ASPART 100 UNIT/ML ~~LOC~~ SOLN
0.0000 [IU] | Freq: Three times a day (TID) | SUBCUTANEOUS | Status: DC
  Administered 2020-07-21: 3 [IU] via SUBCUTANEOUS

## 2020-07-21 MED ORDER — CLINDAMYCIN HCL 300 MG PO CAPS: 300.0000 mg | ORAL_CAPSULE | Freq: Three times a day (TID) | ORAL | 0 refills | Status: AC

## 2020-07-21 MED ORDER — CLINDAMYCIN PHOSPHATE 600 MG/50ML IV SOLN
600.0000 mg | Freq: Three times a day (TID) | INTRAVENOUS | Status: DC
  Administered 2020-07-21 (×2): 600 mg via INTRAVENOUS
  Filled 2020-07-21 (×2): qty 50

## 2020-07-21 MED ORDER — HEPARIN SODIUM (PORCINE) 5000 UNIT/ML IJ SOLN
5000.0000 [IU] | Freq: Three times a day (TID) | INTRAMUSCULAR | Status: DC
  Administered 2020-07-21: 5000 [IU] via SUBCUTANEOUS
  Filled 2020-07-21 (×2): qty 1

## 2020-07-21 MED ORDER — FUROSEMIDE 40 MG PO TABS: 40.0000 mg | ORAL_TABLET | Freq: Two times a day (BID) | ORAL | Status: AC

## 2020-07-21 MED ORDER — ATORVASTATIN CALCIUM 40 MG PO TABS
40.0000 mg | ORAL_TABLET | Freq: Every day | ORAL | Status: DC
  Administered 2020-07-21: 40 mg via ORAL
  Filled 2020-07-21: qty 1

## 2020-07-21 MED ORDER — GABAPENTIN 300 MG PO CAPS
600.0000 mg | ORAL_CAPSULE | Freq: Two times a day (BID) | ORAL | Status: DC
  Administered 2020-07-21: 600 mg via ORAL
  Filled 2020-07-21: qty 2

## 2020-07-21 MED ORDER — ASPIRIN EC 81 MG PO TBEC
81.0000 mg | DELAYED_RELEASE_TABLET | Freq: Every day | ORAL | Status: DC
  Administered 2020-07-21: 81 mg via ORAL
  Filled 2020-07-21: qty 1

## 2020-07-21 MED ORDER — ACETAMINOPHEN 650 MG RE SUPP: 650.0000 mg | Freq: Four times a day (QID) | RECTAL | Status: DC | PRN

## 2020-07-21 MED ORDER — ACETAMINOPHEN 325 MG PO TABS: 650.0000 mg | ORAL_TABLET | Freq: Four times a day (QID) | ORAL | Status: DC | PRN

## 2020-07-21 MED ORDER — METFORMIN HCL ER 500 MG PO TB24
1000.0000 mg | ORAL_TABLET | Freq: Two times a day (BID) | ORAL | Status: DC
  Filled 2020-07-21 (×3): qty 2

## 2020-07-21 MED ORDER — FERROUS SULFATE 325 (65 FE) MG PO TABS
325.0000 mg | ORAL_TABLET | Freq: Every day | ORAL | Status: DC
  Administered 2020-07-21: 325 mg via ORAL
  Filled 2020-07-21: qty 1

## 2020-07-21 NOTE — Discharge Instructions (Signed)

## 2020-07-21 NOTE — Discharge Summary (Addendum)
Physician Discharge Summary  Dana [name] QJJ:[id number] DOB: 05-22-1955 DOA: 07/20/2020  PCP: [name], DO  Admit date: 07/20/2020 Discharge date: 07/21/2020  Admitted From: Home Disposition: Home  Recommendations for Outpatient Follow-up:  1. Follow ups as below. 2. Please obtain CBC/BMP/Mag at follow up 3. Please follow up on the following pending results: None  Home Health: HH PT/OT/RN ordered.  (Addendum) Equipment/Devices: None.  Patient has appropriate DME's  Discharge Condition: Stable for CODE STATUS: Code   Follow-up Information    Newt Minion, MD Follow up in 1 week(s).   Specialty: Orthopedic Surgery Contact information: [place] [address] [phone]        [name], DO. Schedule an appointment as soon as possible for a visit in 1 week(s).   Specialty: Family Medicine Contact information: [address] [place] [address] [phone]               Hospital Course: 65 year old F with PMH of IDDM-2, right BKA, CKD-4 and HTN presented to ED with left foot infection right first toe amputation site, and admitted for left foot cellulitis/diabetic foot infection.  X-ray consistent with cellulitis and charcot foot but no bony lesions.  Evaluated by orthopedic surgery, Dr. Sharol Given who recommended antibiotics and outpatient follow-up.  She was a started on IV clindamycin and discharged on p.o. clindamycin for 10 more days.  She has upcoming appointment medical supply office for her new diabetic shoe.  See individual problem list below for more on hospital course.  Discharge Diagnoses:  Left foot cellulitis/diabetic foot infection-no systemic symptoms.  She has shallow wound at the great toe amputation site without drainage or fluid loculation.  She has no leukocytosis or fever.  Evaluated by orthopedic surgery, Dr. Sharol Given who recommended outpatient follow-up. -Received IV clindamycin and discharged on p.o.  clindamycin. -Outpatient follow-up with PCP and orthopedic surgery -Patient has upcoming appointment with his suppliers for orthostasis for new diabetic shoes. -HH PT/OT/RN ordered (addendum).  Controlled IDDM-2 with hyperglycemia, CKD-4 neuropathy: A1c 6.4%. Recent Labs  Lab 07/20/20 2010 07/20/20 2110 07/21/20 0046 07/21/20 0808 07/21/20 1128  GLUCAP 94 103* 209* 163* 215*  -Continue home insulin, Metformin, statin, gabapentin and Cymbalta -She could benefit from GLP-1 inhibitors given morbid obesity  CKD-4: Stable. Recent Labs    09/30/19 0309 10/01/19 0528 11/03/19 1810 01/18/20 1106 04/02/20 0957 04/02/20 1744 04/03/20 0350 04/04/20 0419 04/05/20 0351 07/20/20 2002 07/21/20 0206  BUN 17 20 39* 36* 48*  --  46* 42* 42* 33* 27*  CREATININE 1.12* 1.12* 1.75* 2.20* 2.29* 2.12* 1.95* 1.77* 1.95* 2.02* 1.87*  -Could benefit from nephrology follow-up outpatient.  Uncontrolled hypertension -Continue home medications.  History of right BKA-she wears prosthetics at home.  GERD: Stable. -Continue home Protonix  Morbid obesity Body mass index is 44.4 kg/m.  -Consider GLP-1 inhibitors -Encourage lifestyle change to lose weight          Discharge Exam: Vitals:   07/21/20 0615 07/21/20 0636  BP: 120/75 (!) 168/82  Pulse: 81 88  Resp: 16 17  Temp: 98 F (36.7 C) 98.1 F (36.7 C)  SpO2: 96% 99%    GENERAL: No apparent distress.  Nontoxic. HEENT: MMM.  Vision and hearing grossly intact.  NECK: Supple.  No apparent JVD.  RESP: On RA.  No IWOB.  Fair aeration bilaterally. CVS:  RRR. Heart sounds normal.  ABD/GI/GU: Bowel sounds present. Soft. Non tender.  MSK/EXT: Right BKA. Mild erythema with clear looking shallow ulceration  at left great toe amputation site.  No increased warmth to touch.  2+ DP pulse in left foot. SKIN: no apparent skin lesion or wound NEURO: Awake, alert and oriented appropriately.  No apparent focal neuro deficit. PSYCH: Calm. Normal  affect.   Discharge Instructions  Discharge Instructions    Call MD for:  extreme fatigue   Complete by: As directed    Call MD for:  persistant dizziness or light-headedness   Complete by: As directed    Call MD for:  redness, tenderness, or signs of infection (pain, swelling, redness, odor or green/yellow discharge around incision site)   Complete by: As directed    Call MD for:  severe uncontrolled pain   Complete by: As directed    Call MD for:  temperature >100.4   Complete by: As directed    Diet - low sodium heart healthy   Complete by: As directed    Diet Carb Modified   Complete by: As directed    Discharge instructions   Complete by: As directed    It has been a pleasure taking care of you!  You were hospitalized  due to left foot infection/cellulitis.  We have started you on antibiotic to treat the infection.  It is very important that you continue taking this antibiotic until you complete the treatment course.  Please follow-up with your primary care doctor in 1 to 2 weeks.  Also follow-up with your orthopedic surgeon as recommended.   It is also very important that you get your diabetes under good control and take your other medications as prescribed.  Please review your new medication list and the directions on your medications before you take them.   Take care,   Increase activity slowly   Complete by: As directed      Allergies as of 07/21/2020      Reactions   Ativan [lorazepam] Anaphylaxis      Codeine Itching, Other (See Comments)   hallucinations   Demerol [meperidine] Anaphylaxis   Keflex [cephalexin] Shortness Of Breath, Rash   Tremors   Lithium Nausea And Vomiting, Other (See Comments)   Can not keep this medication down. It makes her terribly ill.   Doxycycline Other (See Comments)   Severe muscle tremor   Oxycodone Other (See Comments)   Other reaction(s): OTHER Abnormal behavior   Darvocet [propoxyphene N-acetaminophen] Itching   Latex  Itching   Other reaction(s): OTHER   Propoxyphene Itching      Medication List    STOP taking these medications   famotidine 20 MG tablet Commonly known as: PEPCID   metoCLOPramide 5 MG tablet Commonly known as: Reglan     TAKE these medications   aspirin 81 MG EC tablet Take 1 tablet (81 mg total) by mouth daily. Swallow whole.   atorvastatin 40 MG tablet Commonly known as: LIPITOR Take 40 mg by mouth daily.   BD Veo Insulin Syringe U/F 31G X 15/64" 1 ML Misc Generic drug: Insulin Syringe-Needle U-100 Administer insulin up 3 times per day per sliding scale E.11.9   benztropine 1 MG tablet Commonly known as: COGENTIN Take 0.5-1 mg by mouth See admin instructions. Takes 1 mg in the night and 0.5 mg at night   busPIRone 10 MG tablet Commonly known as: BUSPAR Take 20 mg by mouth 3 (three) times daily.   CENTRUM ADULTS PO Take 1 tablet by mouth daily.   clindamycin 300 MG capsule Commonly known as: CLEOCIN Take 1 capsule (300 mg total)  by mouth 3 (three) times daily for 10 days.   DULoxetine 60 MG capsule Commonly known as: CYMBALTA Take 60 mg by mouth 2 (two) times daily.   fluticasone 50 MCG/ACT nasal spray Commonly known as: FLONASE Place 1 spray into both nostrils daily.   freestyle lancets Use as instructed to check blood sugar 3 times per day dx code E11.39   FreeStyle Libre 14 Day Sensor Misc SMARTSIG:1 Each Topical Every 2 Weeks   furosemide 40 MG tablet Commonly known as: LASIX Take 1 tablet (40 mg total) by mouth 2 (two) times daily. Start taking on: July 24, 2020 What changed: These instructions start on July 24, 2020. If you are unsure what to do until then, ask your doctor or other care provider.   gabapentin 600 MG tablet Commonly known as: NEURONTIN Take 600 mg by mouth 2 (two) times daily.   glucose blood test strip Commonly known as: FREESTYLE LITE USE AS INSTRUCTED TO CHECK BLOOD SUGAR 3 TIMES DAILY. DX:E11.65   Iron 325 (65 Fe) MG  Tabs Take 325 mg by mouth daily.   LamoTRIgine 200 MG Tb24 24 hour tablet Take 1 tablet by mouth daily.   metFORMIN 500 MG 24 hr tablet Commonly known as: GLUCOPHAGE-XR TAKE 2 TABLETS IN THE MORNING AND 2 TABLETS AT NIGHT. What changed:   how much to take  how to take this  when to take this  additional instructions   NovoLIN N ReliOn 100 UNIT/ML injection Generic drug: insulin NPH Human INJECT 60 UNITS AM and 50 units PM SUBCUTANEOUSLY TWICE DAILY FOR 30 DAYS What changed:   how much to take  how to take this  when to take this  additional instructions   NovoLOG FlexPen ReliOn 100 UNIT/ML FlexPen Generic drug: insulin aspart Inject 50 Units into the skin 3 (three) times daily.   pantoprazole 40 MG tablet Commonly known as: PROTONIX Take 1 tablet (40 mg total) by mouth daily.   PREVAGEN PO Take 1 capsule by mouth daily.   QUEtiapine 200 MG tablet Commonly known as: SEROQUEL Take 200 mg by mouth at bedtime.   Skin Tac Adhesive Barrier Wipe Misc 1 each by Does not apply route daily.   vitamin B-12 1000 MCG tablet Commonly known as: CYANOCOBALAMIN Take 1 tablet (1,000 mcg total) by mouth daily.       Consultations:  Orthopedic surgery  Procedures/Studies:   CT Head Wo Contrast  Result Date: 07/20/2020 CLINICAL DATA:  Encephalopathy EXAM: CT HEAD WITHOUT CONTRAST TECHNIQUE: Contiguous axial images were obtained from the base of the skull through the vertex without intravenous contrast. COMPARISON:  05/20/2016 FINDINGS: Brain: There is no mass, hemorrhage or extra-axial collection. The size and configuration of the ventricles and extra-axial CSF spaces are normal. The brain parenchyma is normal, without acute or chronic infarction. Vascular: No abnormal hyperdensity of the major intracranial arteries or dural venous sinuses. No intracranial atherosclerosis. Skull: The visualized skull base, calvarium and extracranial soft tissues are normal.  Sinuses/Orbits: No fluid levels or advanced mucosal thickening of the visualized paranasal sinuses. No mastoid or middle ear effusion. The orbits are normal. IMPRESSION: Normal head CT. Electronically Signed   By: Ulyses Jarred M.D.   On: 07/20/2020 22:39   DG Foot Complete Left  Result Date: 07/20/2020 CLINICAL DATA:  Diabetic foot infection.  Purulence with black toes EXAM: LEFT FOOT - COMPLETE 3+ VIEW COMPARISON:  Radiograph 04/02/2020 FINDINGS: Prior resection of the first ray. Chronic dislocation of the second toe at the  metatarsal phalangeal joint. There is bony ankylosis of the tarsal bones and tarsal metatarsal articulation. Disordered alignment of the tarsal bones. Partial ankylosis of the proximal metatarsals. Chronic deformity of the third toe at the metatarsal phalangeal joint may reflect prior injury. Chronic periosteal thickening of the metatarsals most prominently involving the second. No frank bony destruction. Large plantar calcaneal spur with moderate Achilles tendon enthesophyte. There is generalized soft tissue edema throughout the foot. Tiny 4 mm density in the plantar soft tissues was not seen on prior exam may represent foreign body or calcification. No tracking soft tissue air. IMPRESSION: 1. Generalized soft tissue edema. No frank bony destruction. 2. Tiny 4 mm density in the plantar soft tissues may represent foreign body or calcification. 3. Prior resection of the first ray. Multifocal joint ankylosis consistent with Charcot foot. Chronic dislocation of the second toe. 4. Chronic periosteal thickening of metatarsals most prominently involving the second which is unchanged from prior exam. Electronically Signed   By: Keith Rake M.D.   On: 07/20/2020 22:10        The results of significant diagnostics from this hospitalization (including imaging, microbiology, ancillary and laboratory) are listed below for reference.     Microbiology: Recent Results (from the past 240  hour(s))  SARS CORONAVIRUS 2 (TAT 6-24 HRS) Nasopharyngeal Nasopharyngeal Swab     Status: None   Collection Time: 07/21/20  2:15 AM   Specimen: Nasopharyngeal Swab  Result Value Ref Range Status   SARS Coronavirus 2 NEGATIVE NEGATIVE Final    Comment: (NOTE) SARS-CoV-2 target nucleic acids are NOT DETECTED.  The SARS-CoV-2 RNA is generally detectable in upper and lower respiratory specimens during the acute phase of infection. Negative results do not preclude SARS-CoV-2 infection, do not rule out co-infections with other pathogens, and should not be used as the sole basis for treatment or other patient management decisions. Negative results must be combined with clinical observations, patient history, and epidemiological information. The expected result is Negative.  Fact Sheet for Patients: SugarRoll.be  Fact Sheet for Healthcare Providers: https://www.woods-mathews.com/  This test is not yet approved or cleared by the [place] FDA and  has been authorized for detection and/or diagnosis of SARS-CoV-2 by FDA under an Emergency Use Authorization (EUA). This EUA will remain  in effect (meaning this test can be used) for the duration of the COVID-19 declaration under Se ction 564(b)(1) of the Act, 21 U.S.C. section 360bbb-3(b)(1), unless the authorization is terminated or revoked sooner.  Performed at [facility], [place] [address] [place], [place] [address]      Labs:  CBC: Recent Labs  Lab 07/20/20 2002 07/21/20 0206  WBC 7.1 5.6  HGB 10.4* 10.3*  HCT 31.2* 30.9*  MCV 97.8 96.9  PLT 357 347   BMP &GFR Recent Labs  Lab 07/20/20 2002 07/21/20 0206  NA 137 137  K 4.3 4.2  CL 101 101  CO2 25 25  GLUCOSE 63* 207*  BUN 33* 27*  CREATININE 2.02* 1.87*  CALCIUM 9.4 9.3   Estimated Creatinine Clearance: 34.7 mL/min (A) (by C-G formula based on SCr of 1.87 mg/dL (H)). Liver & Pancreas: Recent Labs  Lab  07/20/20 2002  AST 31  ALT 31  ALKPHOS 91  BILITOT 0.7  PROT 7.2  ALBUMIN 3.8   No results for input(s): LIPASE, AMYLASE in the last 168 hours. No results for input(s): AMMONIA in the last 168 hours. Diabetic: Recent Labs    07/21/20 0206  HGBA1C 6.4*  Recent Labs  Lab 07/20/20 1951 07/20/20 2010 07/20/20 2110 07/21/20 0046 07/21/20 0808  GLUCAP 69* 94 103* 209* 163*   Cardiac Enzymes: No results for input(s): CKTOTAL, CKMB, CKMBINDEX, TROPONINI in the last 168 hours. No results for input(s): PROBNP in the last 8760 hours. Coagulation Profile: No results for input(s): INR, PROTIME in the last 168 hours. Thyroid Function Tests: No results for input(s): TSH, T4TOTAL, FREET4, T3FREE, THYROIDAB in the last 72 hours. Lipid Profile: No results for input(s): CHOL, HDL, LDLCALC, TRIG, CHOLHDL, LDLDIRECT in the last 72 hours. Anemia Panel: No results for input(s): VITAMINB12, FOLATE, FERRITIN, TIBC, IRON, RETICCTPCT in the last 72 hours. Urine analysis:    Component Value Date/Time   COLORURINE YELLOW 04/02/2020 1241   APPEARANCEUR CLOUDY (A) 04/02/2020 1241   LABSPEC 1.020 04/02/2020 1241   PHURINE 6.0 04/02/2020 1241   GLUCOSEU >=500 (A) 04/02/2020 1241   GLUCOSEU NEGATIVE 10/01/2016 1531   HGBUR SMALL (A) 04/02/2020 1241   BILIRUBINUR NEGATIVE 04/02/2020 1241   BILIRUBINUR negative 07/20/2018 1607   BILIRUBINUR neg 02/12/2016 1350   KETONESUR NEGATIVE 04/02/2020 1241   PROTEINUR 100 (A) 04/02/2020 1241   UROBILINOGEN 1.0 07/20/2018 1607   UROBILINOGEN 0.2 10/01/2016 1531   NITRITE NEGATIVE 04/02/2020 1241   LEUKOCYTESUR TRACE (A) 04/02/2020 1241   Sepsis Labs: Invalid input(s): PROCALCITONIN, LACTICIDVEN   Time coordinating discharge: 35 minutes  SIGNED:  Mercy Riding, MD  [facility] 07/21/2020, 10:48 AM  If 7PM-7AM, please contact night-coverage [email or web address]

## 2020-07-21 NOTE — ED Notes (Signed)
Attempted to call report at this time 

## 2020-07-21 NOTE — Consult Note (Signed)
ORTHOPAEDIC CONSULTATION  REQUESTING PHYSICIAN: Mercy Riding, MD  Chief Complaint: Cellulitis left leg and left foot.  HPI: Dana [name] is a 65 y.o. female who presents with diabetic insensate neuropathy she is status post a right transtibial amputation and amputation of the left great toe.  Patient has black changes to the toes and midfoot.  There is cellulitis around the mid calf.  Past Medical History:  Diagnosis Date  . Bipolar 1 disorder ([place])   . Cellulitis and abscess of foot 03/08/2015  . Diabetes mellitus    INSULIN DEPENDENT  . Diabetic neuropathy ([place])   . 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 H/O hiatal hernia   . Hyperlipidemia   . Hypertension    past hx of  . Iron malabsorption 09/26/2015  . Numbness and tingling    Hx; of in B/LLE and B/LUE  . Other iron deficiency anemias 09/20/2015   Past Surgical History:  Procedure Laterality Date  . ABDOMINAL HYSTERECTOMY    . ADENOIDECTOMY     Hx: of  . AMPUTATION  10/24/2011   Procedure: AMPUTATION RAY;  Surgeon: Newt Minion, MD;  Location: [facility];  Service: Orthopedics;  Laterality: Right;  Right Foot 3rd Ray Amputation  . AMPUTATION Right 12/16/2012   Procedure: Right Foot Transmetatarsal Amputation;  Surgeon: Newt Minion, MD;  Location: [facility];  Service: Orthopedics;  Laterality: Right;  . AMPUTATION Left 03/09/2015   Procedure: LEFT FOOT 1ST RAY AMPUTATION;  Surgeon: Newt Minion, MD;  Location: [facility];  Service: Orthopedics;  Laterality: Left;  . BLADDER SURGERY     x 2, tacked 1st time; mesh "eroded", had to be removed  . Bladder Tact   2002  . BREAST SURGERY Left    "knot removed"  . CARPAL TUNNEL RELEASE Left   . COLON SURGERY    . ESOPHAGOGASTRODUODENOSCOPY (EGD) WITH PROPOFOL N/A 04/03/2020   Procedure: ESOPHAGOGASTRODUODENOSCOPY (EGD) WITH PROPOFOL;  Surgeon: Lavena Bullion, DO;  Location: [facility];  Service: Gastroenterology;  Laterality: N/A;  . EYE SURGERY  11/17/2017  . NASAL  SEPTUM SURGERY  1976  . POLYPECTOMY  04/03/2020   Procedure: POLYPECTOMY;  Surgeon: Lavena Bullion, DO;  Location: [facility];  Service: Gastroenterology;;  . TONSILLECTOMY     age 36's   Social History   Socioeconomic History  . Marital status: Married    Spouse name: Not on file  . Number of children: 2  . Years of education: 70  . Highest education level: Not on file  Occupational History    Comment: disabled  Tobacco Use  . Smoking status: Never Smoker  . Smokeless tobacco: Never Used  Vaping Use  . Vaping Use: Never used  Substance and Sexual Activity  . Alcohol use: No    Alcohol/week: 0.0 standard drinks  . Drug use: No  . Sexual activity: Never    Partners: Male  Other Topics Concern  . Not on file  Social History Narrative   Lives with husband in home   Caffeine use - Coke 2 or 3 16 oz daily   Social Determinants of Health   Financial Resource Strain: Not on file  Food Insecurity: Not on file  Transportation Needs: Not on file  Physical Activity: Not on file  Stress: Not on file  Social Connections: Not on file   Family History  Problem Relation Age of Onset  . Diabetes Mother   . Mental illness Mother   . Bipolar disorder  Mother   . Hypertension Father   . Osteoarthritis Father   . Heart disease Father   . Diabetes Sister   . Early death Brother        MVA  . Healthy Sister   . Diabetes Maternal Grandmother    - negative except otherwise stated in the family history section Allergies  Allergen Reactions  . Ativan [Lorazepam] Anaphylaxis       . Codeine Itching and Other (See Comments)    hallucinations  . Demerol [Meperidine] Anaphylaxis  . Keflex [Cephalexin] Shortness Of Breath and Rash    Tremors  . Lithium Nausea And Vomiting and Other (See Comments)    Can not keep this medication down. It makes her terribly ill.  . Doxycycline Other (See Comments)    Severe muscle tremor  . Oxycodone Other (See Comments)    Other  reaction(s): OTHER Abnormal behavior  . Darvocet [Propoxyphene N-Acetaminophen] Itching  . Latex Itching    Other reaction(s): OTHER  . Propoxyphene Itching   Prior to Admission medications   Medication Sig Start Date End Date Taking? Authorizing Provider  Apoaequorin (PREVAGEN PO) Take 1 capsule by mouth daily.   Yes [provider]  aspirin 81 MG EC tablet Take 1 tablet (81 mg total) by mouth daily. Swallow whole. 04/12/20  Yes Eugenie Filler, MD  atorvastatin (LIPITOR) 40 MG tablet Take 40 mg by mouth daily. 06/27/20  Yes [provider]  BD VEO INSULIN SYRINGE U/F 31G X 15/64" 1 ML MISC Administer insulin up 3 times per day per sliding scale E.11.9 07/20/18  Yes Scot Jun, FNP  benztropine (COGENTIN) 1 MG tablet Take 0.5-1 mg by mouth See admin instructions. Takes 1 mg in the night and 0.5 mg at night   Yes [provider]  busPIRone (BUSPAR) 10 MG tablet Take 20 mg by mouth 3 (three) times daily.   Yes [provider]  Continuous Blood Gluc Sensor (FREESTYLE LIBRE 14 DAY SENSOR) MISC SMARTSIG:1 Each Topical Every 2 Weeks 07/17/19  Yes [provider]  DULoxetine (CYMBALTA) 60 MG capsule Take 60 mg by mouth 2 (two) times daily. 06/23/20  Yes [provider]  famotidine (PEPCID) 20 MG tablet Take 20 mg by mouth daily.   Yes [provider]  Ferrous Sulfate (IRON) 325 (65 Fe) MG TABS Take 325 mg by mouth daily.    Yes [provider]  fluticasone (FLONASE) 50 MCG/ACT nasal spray Place 1 spray into both nostrils daily. 06/26/20  Yes [provider]  furosemide (LASIX) 40 MG tablet Take 40 mg by mouth 2 (two) times daily.   Yes [provider]  gabapentin (NEURONTIN) 600 MG tablet Take 600 mg by mouth 2 (two) times daily.    Yes [provider]  glucose blood (FREESTYLE LITE) test strip USE AS INSTRUCTED TO CHECK BLOOD SUGAR 3 TIMES DAILY. DX:E11.65 07/20/18  Yes Scot Jun, FNP   LamoTRIgine 200 MG TB24 24 hour tablet Take 1 tablet by mouth daily. 06/16/20  Yes [provider]  Lancets (FREESTYLE) lancets Use as instructed to check blood sugar 3 times per day dx code E11.39 07/20/18  Yes Scot Jun, FNP  metFORMIN (GLUCOPHAGE-XR) 500 MG 24 hr tablet TAKE 2 TABLETS IN THE MORNING AND 2 TABLETS AT NIGHT. Patient taking differently: Take 1,000 mg by mouth 2 (two) times daily. 12/09/17  Yes Elayne Snare, MD  Multiple Vitamins-Minerals (CENTRUM ADULTS PO) Take 1 tablet by mouth daily.  Yes [provider]  NOVOLIN N RELION 100 UNIT/ML injection INJECT 60 UNITS AM and 50 units PM SUBCUTANEOUSLY TWICE DAILY FOR 30 DAYS Patient taking differently: Inject 50-75 Units into the skin See admin instructions. Take 75 units with breakfast, 75 units with lunch, and 50 units with supper-dose confirmed with Dr. Almetta Lovely office 07/20/18  Yes Kenton Kingfisher, Carroll Sage, FNP  NOVOLOG FLEXPEN RELION 100 UNIT/ML FlexPen Inject 50 Units into the skin 3 (three) times daily. 05/04/20  Yes [provider]  Ostomy Supplies (SKIN TAC ADHESIVE BARRIER WIPE) MISC 1 each by Does not apply route daily. 11/25/17  Yes Elayne Snare, MD  pantoprazole (PROTONIX) 40 MG tablet Take 1 tablet (40 mg total) by mouth daily. 07/20/18  Yes Scot Jun, FNP  QUEtiapine (SEROQUEL) 200 MG tablet Take 200 mg by mouth at bedtime. 06/27/20  Yes [provider]  vitamin B-12 (CYANOCOBALAMIN) 1000 MCG tablet Take 1 tablet (1,000 mcg total) by mouth daily. 04/05/20  Yes Eugenie Filler, MD  metoCLOPramide (REGLAN) 5 MG tablet Take 1 tablet (5 mg total) by mouth 3 (three) times daily before meals for 14 days. 04/05/20 04/19/20  Eugenie Filler, MD   CT Head Wo Contrast  Result Date: 07/20/2020 CLINICAL DATA:  Encephalopathy EXAM: CT HEAD WITHOUT CONTRAST TECHNIQUE: Contiguous axial images were obtained from the base of the skull through the vertex without intravenous contrast. COMPARISON:   05/20/2016 FINDINGS: Brain: There is no mass, hemorrhage or extra-axial collection. The size and configuration of the ventricles and extra-axial CSF spaces are normal. The brain parenchyma is normal, without acute or chronic infarction. Vascular: No abnormal hyperdensity of the major intracranial arteries or dural venous sinuses. No intracranial atherosclerosis. Skull: The visualized skull base, calvarium and extracranial soft tissues are normal. Sinuses/Orbits: No fluid levels or advanced mucosal thickening of the visualized paranasal sinuses. No mastoid or middle ear effusion. The orbits are normal. IMPRESSION: Normal head CT. Electronically Signed   By: Ulyses Jarred M.D.   On: 07/20/2020 22:39   DG Foot Complete Left  Result Date: 07/20/2020 CLINICAL DATA:  Diabetic foot infection.  Purulence with black toes EXAM: LEFT FOOT - COMPLETE 3+ VIEW COMPARISON:  Radiograph 04/02/2020 FINDINGS: Prior resection of the first ray. Chronic dislocation of the second toe at the metatarsal phalangeal joint. There is bony ankylosis of the tarsal bones and tarsal metatarsal articulation. Disordered alignment of the tarsal bones. Partial ankylosis of the proximal metatarsals. Chronic deformity of the third toe at the metatarsal phalangeal joint may reflect prior injury. Chronic periosteal thickening of the metatarsals most prominently involving the second. No frank bony destruction. Large plantar calcaneal spur with moderate Achilles tendon enthesophyte. There is generalized soft tissue edema throughout the foot. Tiny 4 mm density in the plantar soft tissues was not seen on prior exam may represent foreign body or calcification. No tracking soft tissue air. IMPRESSION: 1. Generalized soft tissue edema. No frank bony destruction. 2. Tiny 4 mm density in the plantar soft tissues may represent foreign body or calcification. 3. Prior resection of the first ray. Multifocal joint ankylosis consistent with Charcot foot. Chronic  dislocation of the second toe. 4. Chronic periosteal thickening of metatarsals most prominently involving the second which is unchanged from prior exam. Electronically Signed   By: Keith Rake M.D.   On: 07/20/2020 22:10   - pertinent xrays, CT, MRI studies were reviewed and independently interpreted  Positive ROS: All other systems have been reviewed and were otherwise negative with the  exception of those mentioned in the HPI and as above.  Physical Exam: General: Alert, no acute distress Psychiatric: Patient is competent for consent with normal mood and affect Lymphatic: No axillary or cervical lymphadenopathy Cardiovascular: No pedal edema Respiratory: No cyanosis, no use of accessory musculature GI: No organomegaly, abdomen is soft and non-tender    Images:  @ENCIMAGES @  Labs:  Lab Results  Component Value Date   HGBA1C 6.4 (H) 07/21/2020   HGBA1C 6.9 (H) 04/02/2020   HGBA1C 7.2 (H) 09/26/2019   ESRSEDRATE 130 (H) 02/07/2016   ESRSEDRATE 120 (H) 10/23/2011   CRP 17.7 (H) 02/07/2016   CRP 21.99 (H) 10/23/2011   REPTSTATUS 04/04/2020 FINAL 04/02/2020   GRAMSTAIN  01/20/2017    CYTOSPIN SMEAR WBC PRESENT, PREDOMINANTLY MONONUCLEAR NO ORGANISMS SEEN    CULT >=100,000 COLONIES/mL ESCHERICHIA COLI (A) 04/02/2020   LABORGA ESCHERICHIA COLI (A) 04/02/2020    Lab Results  Component Value Date   ALBUMIN 3.8 07/20/2020   ALBUMIN 3.2 (L) 04/03/2020   ALBUMIN 3.5 04/02/2020    Neurologic: Patient does not have protective sensation bilateral lower extremities.   MUSCULOSKELETAL:   Skin: Examination the area of blackness around her foot and toes appears to be sustained from dye from her shoes.  Patient states she has been wearing her black shoes without socks and this caused the discoloration.  Patient has a strong dorsalis pedis pulse no arterial insufficiency there are no ischemic ulcers she does have cellulitis in the mid calf with pitting edema and venous  insufficiency with lymphatic insufficiency.  Her foot is plantigrade with a Charcot pes planus.  Radiographs do not show any destructive bony changes through the foot other than some chronic changes of the second metatarsal which appears stable.  Patient's recent hemoglobin A1c is 6.4.  Assessment: Diabetic insensate neuropathy with chronic changes to the second metatarsal left foot with cellulitis of the foot and leg.  The black discoloration is dye from her shoe.  Plan: I recommended compression for the swelling and the venous and lymphatic dermatitis.  Patient states that she would not wear any sock tighter than the hospital sock.  I will follow-up in the office as an outpatient.  Thank you for the consult and the opportunity to see [name]  Tayshon Winker, [place] [phone] 7:48 AM

## 2020-07-21 NOTE — H&P (Signed)
History and Physical    Dana [name] IRC:[id number] DOB: 1955/07/05 DOA: 07/20/2020  PCP: Dana Morning, DO   Patient coming from: Home  Chief Complaint: Redness and infection of left foot  HPI: Dana [name] is a 65 y.o. female with medical history significant for insulin dependent T2DM, HTN, diabetic neuropathy, left BKA, right 1st toe and metatarsal amputation, presenting with complaint of left foot infection.  She has not been taking her Metformin or gabapentin for the last few days because she states the medications got wet she was unable to take them.  She is waiting for refills of the medication. She states she has been taking her insulin. She states her blood sugars have been "good" but she does not reveal the values of BS readings.  She states she does have any feeling in her foot and has no pain in her foot.  Denies fevers. She has not had nausea or vomiting. She denies any drainage from her foot. It is not known how compliant she is with her medications. The ER provider stated that family member told her earlier that [name] is not good at taking her mediations.  She has had gradually worsening balance with ambulation that has been occurring over some time now.  They report she is planned to enter a nursing home on Wednesday next week for more regular assistance throughout the day. She had right BKA 2-3 years ago and left first toe amputated a year ago she states. She reports she always has redness of her left mid leg which is unchanged.    Review of Systems:  General: Denies fever, chills, weight loss, night sweats.  Denies dizziness.   HENT: Denies head trauma, headache, denies tinnitus.  Denies nasal congestion or bleeding.  Denies sore throat, sores in mouth.   Eyes: Denies blurry vision, pain in eye, drainage.  Denies discoloration of eyes. Neck: Denies pain.  Denies swelling.  Denies pain with movement. Cardiovascular: Denies chest pain, palpitations.  Denies edema.  Denies  orthopnea Respiratory: Denies shortness of breath, cough.  Denies wheezing.  Denies sputum production Gastrointestinal: Denies abdominal pain, swelling.  Denies nausea, vomiting, diarrhea.  Denies melena.  Denies hematemesis. Musculoskeletal: Denies limitation of movement.  Denies deformity or swelling.  Denies pain.  Denies arthralgias or myalgias. Genitourinary: Denies pelvic pain.  Denies urinary frequency or hesitancy.  Denies dysuria.  Skin: Reports redness of left distal foot without drainage.  Denies petechiae, purpura, ecchymosis. Neurological: Denies headache.  Denies syncope.  Denies seizure activity. Denies slurred speech, drooping face.  Denies visual change. Psychiatric: Denies depression, anxiety. Denies hallucinations.  Past Medical History:  Diagnosis Date  . Bipolar 1 disorder ([place])   . Cellulitis and abscess of foot 03/08/2015  . Diabetes mellitus    INSULIN DEPENDENT  . Diabetic neuropathy ([place])   . 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 H/O hiatal hernia   . Hyperlipidemia   . Hypertension    past hx of  . Iron malabsorption 09/26/2015  . Numbness and tingling    Hx; of in B/LLE and B/LUE  . Other iron deficiency anemias 09/20/2015    Past Surgical History:  Procedure Laterality Date  . ABDOMINAL HYSTERECTOMY    . ADENOIDECTOMY     Hx: of  . AMPUTATION  10/24/2011   Procedure: AMPUTATION RAY;  Surgeon: Newt Minion, MD;  Location: [facility];  Service: Orthopedics;  Laterality: Right;  Right Foot 3rd Ray Amputation  . AMPUTATION Right 12/16/2012   Procedure:  Right Foot Transmetatarsal Amputation;  Surgeon: Newt Minion, MD;  Location: [facility];  Service: Orthopedics;  Laterality: Right;  . AMPUTATION Left 03/09/2015   Procedure: LEFT FOOT 1ST RAY AMPUTATION;  Surgeon: Newt Minion, MD;  Location: [facility];  Service: Orthopedics;  Laterality: Left;  . BLADDER SURGERY     x 2, tacked 1st time; mesh "eroded", had to be removed  . Bladder Tact   2002  . BREAST  SURGERY Left    "knot removed"  . CARPAL TUNNEL RELEASE Left   . COLON SURGERY    . ESOPHAGOGASTRODUODENOSCOPY (EGD) WITH PROPOFOL N/A 04/03/2020   Procedure: ESOPHAGOGASTRODUODENOSCOPY (EGD) WITH PROPOFOL;  Surgeon: Lavena Bullion, DO;  Location: [facility];  Service: Gastroenterology;  Laterality: N/A;  . EYE SURGERY  11/17/2017  . NASAL SEPTUM SURGERY  1976  . POLYPECTOMY  04/03/2020   Procedure: POLYPECTOMY;  Surgeon: Lavena Bullion, DO;  Location: [facility];  Service: Gastroenterology;;  . TONSILLECTOMY     age 38's    Social History  reports that she has never smoked. She has never used smokeless tobacco. She reports that she does not drink alcohol and does not use drugs.  Allergies  Allergen Reactions  . Ativan [Lorazepam] Anaphylaxis       . Codeine Itching and Other (See Comments)    hallucinations  . Demerol [Meperidine] Anaphylaxis  . Keflex [Cephalexin] Shortness Of Breath and Rash    Tremors  . Lithium Nausea And Vomiting and Other (See Comments)    Can not keep this medication down. It makes her terribly ill.  . Doxycycline Other (See Comments)    Severe muscle tremor  . Oxycodone Other (See Comments)    Other reaction(s): OTHER Abnormal behavior  . Darvocet [Propoxyphene N-Acetaminophen] Itching  . Latex Itching    Other reaction(s): OTHER  . Propoxyphene Itching    Family History  Problem Relation Age of Onset  . Diabetes Mother   . Mental illness Mother   . Bipolar disorder Mother   . Hypertension Father   . Osteoarthritis Father   . Heart disease Father   . Diabetes Sister   . Early death Brother        MVA  . Healthy Sister   . Diabetes Maternal Grandmother      Prior to Admission medications   Medication Sig Start Date End Date Taking? Authorizing Provider  Apoaequorin (PREVAGEN PO) Take 1 capsule by mouth daily.   Yes [provider]  aspirin 81 MG EC tablet Take 1 tablet (81 mg total) by mouth daily. Swallow whole.  04/12/20  Yes Eugenie Filler, MD  atorvastatin (LIPITOR) 40 MG tablet Take 40 mg by mouth daily. 06/27/20  Yes [provider]  BD VEO INSULIN SYRINGE U/F 31G X 15/64" 1 ML MISC Administer insulin up 3 times per day per sliding scale E.11.9 07/20/18  Yes Scot Jun, FNP  benztropine (COGENTIN) 1 MG tablet Take 0.5-1 mg by mouth See admin instructions. Takes 1 mg in the night and 0.5 mg at night   Yes [provider]  busPIRone (BUSPAR) 10 MG tablet Take 20 mg by mouth 3 (three) times daily.   Yes [provider]  Continuous Blood Gluc Sensor (FREESTYLE LIBRE 14 DAY SENSOR) MISC SMARTSIG:1 Each Topical Every 2 Weeks 07/17/19  Yes [provider]  DULoxetine (CYMBALTA) 60 MG capsule Take 60 mg by mouth 2 (two) times daily. 06/23/20  Yes [provider]  famotidine (PEPCID)  20 MG tablet Take 20 mg by mouth daily.   Yes [provider]  Ferrous Sulfate (IRON) 325 (65 Fe) MG TABS Take 325 mg by mouth daily.    Yes [provider]  fluticasone (FLONASE) 50 MCG/ACT nasal spray Place 1 spray into both nostrils daily. 06/26/20  Yes [provider]  furosemide (LASIX) 40 MG tablet Take 40 mg by mouth 2 (two) times daily.   Yes [provider]  gabapentin (NEURONTIN) 600 MG tablet Take 600 mg by mouth 2 (two) times daily.    Yes [provider]  glucose blood (FREESTYLE LITE) test strip USE AS INSTRUCTED TO CHECK BLOOD SUGAR 3 TIMES DAILY. DX:E11.65 07/20/18  Yes Scot Jun, FNP  LamoTRIgine 200 MG TB24 24 hour tablet Take 1 tablet by mouth daily. 06/16/20  Yes [provider]  Lancets (FREESTYLE) lancets Use as instructed to check blood sugar 3 times per day dx code E11.39 07/20/18  Yes Scot Jun, FNP  metFORMIN (GLUCOPHAGE-XR) 500 MG 24 hr tablet TAKE 2 TABLETS IN THE [name] AND 2 TABLETS AT NIGHT. Patient taking differently: Take 1,000 mg by mouth 2 (two) times daily. 12/09/17  Yes Elayne Snare, MD  Multiple Vitamins-Minerals (CENTRUM ADULTS PO) Take 1 tablet by mouth daily.   Yes [provider]  NOVOLIN N RELION 100 UNIT/ML injection INJECT 60 UNITS AM and 50 units PM SUBCUTANEOUSLY TWICE DAILY FOR 30 DAYS Patient taking differently: Inject 50-75 Units into the skin See admin instructions. Take 75 units with breakfast, 75 units with lunch, and 50 units with supper-dose confirmed with Dr. Almetta Lovely office 07/20/18  Yes Kenton Kingfisher, Carroll Sage, FNP  NOVOLOG FLEXPEN RELION 100 UNIT/ML FlexPen Inject 50 Units into the skin 3 (three) times daily. 05/04/20  Yes [provider]  Ostomy Supplies (SKIN TAC ADHESIVE BARRIER WIPE) MISC 1 each by Does not apply route daily. 11/25/17  Yes Elayne Snare, MD  pantoprazole (PROTONIX) 40 MG tablet Take 1 tablet (40 mg total) by mouth daily. 07/20/18  Yes Scot Jun, FNP  QUEtiapine (SEROQUEL) 200 MG tablet Take 200 mg by mouth at bedtime. 06/27/20  Yes [provider]  vitamin B-12 (CYANOCOBALAMIN) 1000 MCG tablet Take 1 tablet (1,000 mcg total) by mouth daily. 04/05/20  Yes Eugenie Filler, MD  metoCLOPramide (REGLAN) 5 MG tablet Take 1 tablet (5 mg total) by mouth 3 (three) times daily before meals for 14 days. 04/05/20 04/19/20  Eugenie Filler, MD    Physical Exam: Vitals:   07/20/20 2300 07/20/20 2345 07/20/20 2348 07/21/20 0000  BP: 130/79 (!) 137/108 (!) 137/108 (!) 144/101  Pulse: 92 88 89 71  Resp: 16 20 (!) 25 19  Temp:      TempSrc:      SpO2: 98% 98% 97% 95%  Weight:      Height:        Constitutional: NAD, calm, comfortable Vitals:   07/20/20 2300 07/20/20 2345 07/20/20 2348 07/21/20 0000  BP: 130/79 (!) 137/108 (!) 137/108 (!) 144/101  Pulse: 92 88 89 71  Resp: 16 20 (!) 25 19  Temp:      TempSrc:      SpO2: 98% 98% 97% 95%  Weight:      Height:       General: WDWN, Alert and oriented x3.  Eyes: EOMI, PERRL,  conjunctivae normal.  Sclera nonicteric HENT:  [place]/AT, external ears normal.   Nares patent without epistasis.  Mucous membranes are moist. Neck: Soft,  normal range of motion, supple, no masses, no thyromegaly.  Trachea midline Respiratory: clear to auscultation bilaterally, no wheezing, no crackles. Normal respiratory effort. No accessory muscle use.  Cardiovascular: Regular rate and rhythm, no murmurs / rubs / gallops. No extremity edema. +1 pedal pulse left foot Abdomen: Soft, no tenderness, nondistended, no rebound or guarding.  Morbid obesity.  No masses palpated. Bowel sounds normoactive Musculoskeletal: FROM. No cyanosis.  Right BKA.  Left first toe status post amputation normal muscle tone.  Skin: Warm, dry, intact.  Erythema of left distal foot.  No fluctuation.  Small scab ulcer still left foot at first MP joint without drainage.  Patient has thickened red skin of the left leg which she states is chronic and unchanged.  Erythema of the distal foot and the redness of the anterior left leg are not continuous with normal colored thickened skin in between.  Left leg is consistent with stasis dermatitis. Neurologic: CN 2-12 grossly intact.  Normal speech.  Sensation intact, Strength 5/5 in all extremities.   Psychiatric: Normal judgment and insight.  Normal mood.    Labs on Admission: I have personally reviewed following labs and imaging studies  CBC: Recent Labs  Lab 07/20/20 2002  WBC 7.1  HGB 10.4*  HCT 31.2*  MCV 97.8  PLT 419    Basic Metabolic Panel: Recent Labs  Lab 07/20/20 2002  NA 137  K 4.3  CL 101  CO2 25  GLUCOSE 63*  BUN 33*  CREATININE 2.02*  CALCIUM 9.4    GFR: Estimated Creatinine Clearance: 32.1 mL/min (A) (by C-G formula based on SCr of 2.02 mg/dL (H)).  Liver Function Tests: Recent Labs  Lab 07/20/20 2002  AST 31  ALT 31  ALKPHOS 91  BILITOT 0.7  PROT 7.2  ALBUMIN 3.8    Urine analysis:    Component Value Date/Time   COLORURINE YELLOW 04/02/2020 1241   APPEARANCEUR CLOUDY (A) 04/02/2020 1241   LABSPEC 1.020  04/02/2020 1241   PHURINE 6.0 04/02/2020 1241   GLUCOSEU >=500 (A) 04/02/2020 1241   GLUCOSEU NEGATIVE 10/01/2016 1531   HGBUR SMALL (A) 04/02/2020 1241   BILIRUBINUR NEGATIVE 04/02/2020 1241   BILIRUBINUR negative 07/20/2018 1607   BILIRUBINUR neg 02/12/2016 1350   KETONESUR NEGATIVE 04/02/2020 1241   PROTEINUR 100 (A) 04/02/2020 1241   UROBILINOGEN 1.0 07/20/2018 1607   UROBILINOGEN 0.2 10/01/2016 1531   NITRITE NEGATIVE 04/02/2020 1241   LEUKOCYTESUR TRACE (A) 04/02/2020 1241    Radiological Exams on Admission: CT Head Wo Contrast  Result Date: 07/20/2020 CLINICAL DATA:  Encephalopathy EXAM: CT HEAD WITHOUT CONTRAST TECHNIQUE: Contiguous axial images were obtained from the base of the skull through the vertex without intravenous contrast. COMPARISON:  05/20/2016 FINDINGS: Brain: There is no mass, hemorrhage or extra-axial collection. The size and configuration of the ventricles and extra-axial CSF spaces are normal. The brain parenchyma is normal, without acute or chronic infarction. Vascular: No abnormal hyperdensity of the major intracranial arteries or dural venous sinuses. No intracranial atherosclerosis. Skull: The visualized skull base, calvarium and extracranial soft tissues are normal. Sinuses/Orbits: No fluid levels or advanced mucosal thickening of the visualized paranasal sinuses. No mastoid or middle ear effusion. The orbits are normal. IMPRESSION: Normal head CT. Electronically Signed   By: Ulyses Jarred M.D.   On: 07/20/2020 22:39   DG Foot Complete Left  Result Date: 07/20/2020 CLINICAL DATA:  Diabetic foot infection.  Purulence with black toes EXAM: LEFT FOOT - COMPLETE 3+ VIEW COMPARISON:  Radiograph 04/02/2020  FINDINGS: Prior resection of the first ray. Chronic dislocation of the second toe at the metatarsal phalangeal joint. There is bony ankylosis of the tarsal bones and tarsal metatarsal articulation. Disordered alignment of the tarsal bones. Partial ankylosis of the  proximal metatarsals. Chronic deformity of the third toe at the metatarsal phalangeal joint may reflect prior injury. Chronic periosteal thickening of the metatarsals most prominently involving the second. No frank bony destruction. Large plantar calcaneal spur with moderate Achilles tendon enthesophyte. There is generalized soft tissue edema throughout the foot. Tiny 4 mm density in the plantar soft tissues was not seen on prior exam may represent foreign body or calcification. No tracking soft tissue air. IMPRESSION: 1. Generalized soft tissue edema. No frank bony destruction. 2. Tiny 4 mm density in the plantar soft tissues may represent foreign body or calcification. 3. Prior resection of the first ray. Multifocal joint ankylosis consistent with Charcot foot. Chronic dislocation of the second toe. 4. Chronic periosteal thickening of metatarsals most prominently involving the second which is unchanged from prior exam. Electronically Signed   By: Keith Rake M.D.   On: 07/20/2020 22:10    Assessment/Plan Active Problems:   Cellulitis of left foot [name] has some erythema and swelling of her distal left foot.  She has no purulent drainage.  Reports that the redness started a few days ago.  She has not been to her PCP for it. Started on clindamycin for antibiotic coverage.  We will give IV tonight and can be transitioned to p.o. during the day. She has no fever, normal white blood cell count and no nausea or vomiting.  She was given dose of Zosyn in the emergency room Check CBC in [name]    DM (diabetes mellitus), type 2 with renal complications  Continue home dose of Metformin.  Monitor blood sugars with meals and at bedtime.  Sliding scale insulin provided for glycemic control.  Check hemoglobin A1c.    CKD (chronic kidney disease) stage 4, GFR 15-29 ml/min  Stable chronic kidney disease. Check electrolytes renal function [name]    Hx of right BKA     DVT prophylaxis: Heparin SQ  for DVT prophylaxis Code Status:   Full code Family Communication:  Diagnosis and plan discussed with patient.  Patient verbalized understanding agrees with plan.  Further management as clinically indicated Disposition Plan:   Patient is from:  Home  Anticipated DC to:  Home  Anticipated DC date:  Anticipate less than 2 midnight stay in the hospital to treat acute condition  Anticipated DC barriers: No barriers to discharge identified at this time   Admission status:  Observation   Eben Burow MD [facility]  How to contact the [facility] Attending or Consulting provider [place] or covering provider during after hours [place], for this patient?   1. Check the care team in [facility] and look for a) attending/consulting [facility] provider listed and b) the [facility] team listed 2. Log into [email or web address] and use [place]'s universal password to access. If you do not have the password, please contact the hospital operator. 3. Locate the [facility] provider you are looking for under [facility] and page to a number that you can be directly reached. 4. If you still have difficulty reaching the provider, please page the [facility] (Director on Call) for the Hospitalists listed on amion for assistance.  07/21/2020, 12:52 AM

## 2020-07-21 NOTE — Evaluation (Signed)
Physical Therapy Evaluation Patient Details Name: Dana [name] MRN: [id number] DOB: Sep 27, 1955 Today's Date: 07/21/2020   History of Present Illness  Pt is a 65 y.o. F admitted 3/11 with left foot cellulitis. Significant PMH: DM2, CKD stage 4, right BKA, left first ray amputation.  Clinical Impression  PTA, pt lives with her family in a ramped entrance residence and uses a Rollator for ambulation. Pt reports difficulty completing lower body ADL's including donning left foot sock; thus she has black residue on her foot from her orthopedic shoe. Pt seems to present fairly close to her functional baseline. Ambulating room distances with a walker at a min guard assist level. Noted slippage of R prosthetic and associated gait abnormalities; pt reports she has an OP appointment scheduled with [facility]. Pt presents as a high fall risk based on decreased gait speed and history of recent falls. Recommend HHPT follow up.     Follow Up Recommendations Home health PT;Supervision for mobility/OOB    Equipment Recommendations  None recommended by PT    Recommendations for Other Services       Precautions / Restrictions Precautions Precautions: Fall Precaution Comments: R prosthetic in room Restrictions Weight Bearing Restrictions: No      Mobility  Bed Mobility Overal bed mobility: Modified Independent                  Transfers Overall transfer level: Needs assistance Equipment used: Rolling walker (2 wheeled) Transfers: Sit to/from Stand Sit to Stand: Supervision            Ambulation/Gait Ambulation/Gait assistance: Min guard Gait Distance (Feet): 40 Feet Assistive device: Rolling walker (2 wheeled) Gait Pattern/deviations: Step-through pattern;Decreased stride length     General Gait Details: Min guard for safety. noted right hip adduction during swing phase. Prosthetic slippage  Stairs            Wheelchair Mobility    Modified Rankin (Stroke Patients  Only)       Balance Overall balance assessment: Needs assistance Sitting-balance support: Feet supported Sitting balance-Leahy Scale: Good     Standing balance support: Bilateral upper extremity supported Standing balance-Leahy Scale: Poor Standing balance comment: reliant on external support                             Pertinent Vitals/Pain Pain Assessment: No/denies pain    Home Living Family/patient expects to be discharged to:: Private residence Living Arrangements: Parent;Other relatives Available Help at Discharge: Family;Available PRN/intermittently Type of Home: House Home Access: Ramped entrance;Stairs to enter     Home Layout: One level Home Equipment: Grab bars - toilet;Grab bars - tub/shower;Shower seat;Walker - 4 wheels      Prior Function Level of Independence: Needs assistance   Gait / Transfers Assistance Needed: Ambulates with Rollator  ADL's / Homemaking Assistance Needed: Reports difficulty completing LB ADL's  Comments: History of several recent falls     Hand Dominance   Dominant Hand: Right    Extremity/Trunk Assessment   Upper Extremity Assessment Upper Extremity Assessment: Defer to OT evaluation    Lower Extremity Assessment Lower Extremity Assessment: RLE deficits/detail;LLE deficits/detail RLE Deficits / Details: BKA. Hip/knee strength WFL LLE Deficits / Details: 1st toe amputation. Hip/knee WFL       Communication   Communication: No difficulties  Cognition Arousal/Alertness: Awake/alert Behavior During Therapy: WFL for tasks assessed/performed Overall Cognitive Status: Within Functional Limits for tasks assessed  General Comments      Exercises     Assessment/Plan    PT Assessment Patient needs continued PT services  PT Problem List         PT Treatment Interventions DME instruction;Gait training;Functional mobility training;Therapeutic  exercise;Therapeutic activities;Balance training;Patient/family education    PT Goals (Current goals can be found in the Care Plan section)  Acute Rehab PT Goals Patient Stated Goal: make adjustments to prosthetic PT Goal Formulation: With patient Time For Goal Achievement: 08/04/20 Potential to Achieve Goals: Good    Frequency Min 3X/week   Barriers to discharge        Co-evaluation               AM-PAC PT "6 Clicks" Mobility  Outcome Measure Help needed turning from your back to your side while in a flat bed without using bedrails?: None Help needed moving from lying on your back to sitting on the side of a flat bed without using bedrails?: None Help needed moving to and from a bed to a chair (including a wheelchair)?: None Help needed standing up from a chair using your arms (e.g., wheelchair or bedside chair)?: None Help needed to walk in hospital room?: A Little Help needed climbing 3-5 steps with a railing? : A Little 6 Click Score: 22    End of Session Equipment Utilized During Treatment: Gait belt Activity Tolerance: Patient tolerated treatment well Patient left: in bed;with call bell/phone within reach Nurse Communication: Mobility status PT Visit Diagnosis: Unsteadiness on feet (R26.81);Other abnormalities of gait and mobility (R26.89)    Time: 7017-7939 PT Time Calculation (min) (ACUTE ONLY): 20 min   Charges:   PT Evaluation $PT Eval Moderate Complexity: 1 Mod          Wyona Almas, PT, DPT Acute Rehabilitation Services Pager [phone] Office [phone]   Deno Etienne 07/21/2020, 9:56 AM

## 2020-07-21 NOTE — Progress Notes (Signed)
Received patient from ED.  Patient AOx4, VSS with slightly elevated BP at 168/82, denies any pain, oriented on room, bed control, call light and plan of care.  Patient has right BKA with prosthetic. Gave menu for patient to order her breakfast.  Will endorse to day shift RN appropriately.

## 2020-07-21 NOTE — TOC Initial Note (Signed)
Transition of Care [facility]) - Initial/Assessment Note    Patient Details  Name: Dana [name] MRN: [id number] Date of Birth: Sep 27, 1955  Transition of [facility]) CM/SW Contact:    Curlene Labrum, RN Phone Number: 07/21/2020, 3:21 PM  Clinical Narrative:                 Case management spoke with the patient on the phone regarding transitions to home.  The patient states that she is moving to assisted living at [facility] in [facility] on Monday and her son, [name] is arranging - [phone].  I called and spoke with him and he states that the admission director at [facility] is [name] - called and left a message in regards of patient needing home health services including RN, PT,OT.  I [facility]ed [facility]s to [name] to [phone].  I called and left a message with Judson Roch, CM with [facility] for service needs at the facility.  CM will place plans in discharge instructions, and the son, Reita Cliche is aware.  The patient may be discharged home today with family.  Expected Discharge Plan: Assisted Living Barriers to Discharge: No Barriers Identified   Patient Goals and CMS Choice Patient states their goals for this hospitalization and ongoing recovery are:: Patient planning to move to [facility] assisted living on Monday - sent clinicals to admissions at center for home health needs. CMS Medicare.gov Compare Post Acute Care list provided to:: Patient Represenative (must comment) (Patient and son, Reita Cliche - [phone]) Choice offered to / list presented to : Patient  Expected Discharge Plan and Services Expected Discharge Plan: Assisted Living   Discharge Planning Services: CM Consult Post Acute Care Choice: [place] arrangements for the past 2 months: [place] ([facility] in [place]) Expected Discharge Date: 07/21/20                 DME Agency:  [name]) Date DME Agency Contacted:  07/21/20 Time DME Agency Contacted: [phone] Representative spoke with at DME Agency: Judson Roch - left message for [facility] needs for PT, OT, RN HH Arranged: RN,PT,OT [place] Agency:  [name]) Date [place]: 07/21/20 Time [place]: 1520 Representative spoke with at [place]: message left with Judson Roch, [place] with [facility] faxed to [place]  Prior Living Arrangements/Services Living arrangements for the past 2 months: [place] ([facility] in [place]) Lives with:: Relatives Patient language and need for interpreter reviewed:: Yes Do you feel safe going back to the place where you live?: Yes      Need for Family Participation in Patient Care: Yes (Comment) Care giver support system in place?: Yes (comment) Current home services: DME ([facility] home) Criminal Activity/Legal Involvement Pertinent to Current Situation/Hospitalization: No - Comment as needed  Activities of Daily Living      Permission Sought/Granted Permission sought to share information with : Case Manager Permission granted to share information with : Yes, Verbal Permission Granted     Permission granted to share info w AGENCY: [place], [facility]  Permission granted to share info w Relationship: son - [name] [phone]     Emotional Assessment Appearance:: Appears stated age Attitude/Demeanor/Rapport: Engaged Affect (typically observed): Accepting Orientation: : Oriented to Self,Oriented to Place,Oriented to  Time,Oriented to Situation Alcohol / Substance Use: Not Applicable Psych Involvement: No (comment)  Admission diagnosis:  Cellulitis of left foot [  L03.116] Cellulitis of left lower extremity [L03.116] Patient Active Problem List   Diagnosis Date Noted  . Cellulitis of left foot 07/21/2020  . DM (diabetes mellitus), type 2 with renal complications ([place]) 51/06/5850  . CKD (chronic  kidney disease) stage 4, GFR 15-29 ml/min (HCC) 07/21/2020  . Hx of right BKA ([place]) 07/21/2020  . Cellulitis of left lower extremity   . Low vitamin B12 level 04/04/2020  . E. coli UTI 04/04/2020  . AKI (acute kidney injury) ([place])   . Generalized abdominal pain   . Benign fundic gland polyps of stomach   . AMS (altered mental status) 04/02/2020  . Severe sepsis ([place]) 09/27/2019  . Acute respiratory failure with hypoxia ([place]) 09/27/2019  . Emesis 09/27/2019  . Hyperglycemia 09/27/2019  . Sepsis ([place]) 09/27/2019  . Encephalopathy, metabolic   . Coffee ground emesis   . Hyperkalemia   . Pneumonia 09/26/2019  . Partial nontraumatic amputation of left foot ([place]) 04/28/2017  . Acquired absence of right leg below knee ([place]) 04/14/2017  . Pleural effusion 01/19/2017  . Contracture of left Achilles tendon 01/16/2017  . Allergic rhinitis 01/03/2017  . Gastroesophageal reflux disease without esophagitis 01/03/2017  . Hiatal hernia 02/03/2016  . Anemia associated with diabetes mellitus ([place]) 11/07/2015  . Iron malabsorption 09/26/2015  . Other iron deficiency anemia 09/20/2015  . 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 Diabetic retinopathy associated with diabetes mellitus due to underlying condition ([place]) 03/15/2015  . DM (diabetes mellitus) type II uncontrolled with eye manifestation ([place]) 11/08/2014  . Hyperlipidemia LDL goal <100 11/08/2014  . Diabetic polyneuropathy associated with type 2 diabetes mellitus ([place]) 11/08/2014  . Partial nontraumatic amputation of foot ([place]) 11/08/2014  . Type 2 diabetes mellitus without complication, with long-term current use of insulin ([place]) 10/24/2011  . Bipolar disorder ([place]) 10/24/2011   PCP:  [name], DO Pharmacy:   [facility] [address] [place] [address] Phone: [phone] Fax: [phone]  [facility] [place], [place] [address] Phone: [phone] Fax: [phone]     Social Determinants of Health (SDOH) Interventions    Readmission Risk Interventions No flowsheet data found.

## 2020-07-23 DIAGNOSIS — Z111 Encounter for screening for respiratory tuberculosis: Secondary | ICD-10-CM | POA: Diagnosis not present

## 2020-07-24 ENCOUNTER — Encounter (HOSPITAL_COMMUNITY): Payer: Self-pay | Admitting: Emergency Medicine

## 2020-07-24 ENCOUNTER — Inpatient Hospital Stay (HOSPITAL_COMMUNITY): Payer: PPO

## 2020-07-24 ENCOUNTER — Observation Stay (HOSPITAL_COMMUNITY)
Admission: EM | Admit: 2020-07-24 | Discharge: 2020-07-25 | Disposition: A | Discharge status: Home / Self Care | Payer: PPO | Attending: Internal Medicine | Admitting: Internal Medicine

## 2020-07-24 DIAGNOSIS — I129 Hypertensive chronic kidney disease with stage 1 through stage 4 chronic kidney disease, or unspecified chronic kidney disease: Secondary | ICD-10-CM | POA: Insufficient documentation

## 2020-07-24 DIAGNOSIS — Z89511 Acquired absence of right leg below knee: Secondary | ICD-10-CM | POA: Diagnosis not present

## 2020-07-24 DIAGNOSIS — Z20822 Contact with and (suspected) exposure to covid-19: Secondary | ICD-10-CM | POA: Diagnosis not present

## 2020-07-24 DIAGNOSIS — E7800 Pure hypercholesterolemia, unspecified: Secondary | ICD-10-CM | POA: Diagnosis not present

## 2020-07-24 DIAGNOSIS — Z7982 Long term (current) use of aspirin: Secondary | ICD-10-CM | POA: Diagnosis not present

## 2020-07-24 DIAGNOSIS — E11622 Type 2 diabetes mellitus with other skin ulcer: Secondary | ICD-10-CM | POA: Diagnosis not present

## 2020-07-24 DIAGNOSIS — Z91040 Latex allergy status: Secondary | ICD-10-CM | POA: Insufficient documentation

## 2020-07-24 DIAGNOSIS — L03116 Cellulitis of left lower limb: Secondary | ICD-10-CM | POA: Diagnosis not present

## 2020-07-24 DIAGNOSIS — Z7984 Long term (current) use of oral hypoglycemic drugs: Secondary | ICD-10-CM | POA: Insufficient documentation

## 2020-07-24 DIAGNOSIS — M7732 Calcaneal spur, left foot: Secondary | ICD-10-CM | POA: Diagnosis not present

## 2020-07-24 DIAGNOSIS — S91302A Unspecified open wound, left foot, initial encounter: Secondary | ICD-10-CM | POA: Diagnosis not present

## 2020-07-24 DIAGNOSIS — E1165 Type 2 diabetes mellitus with hyperglycemia: Secondary | ICD-10-CM | POA: Insufficient documentation

## 2020-07-24 DIAGNOSIS — N184 Chronic kidney disease, stage 4 (severe): Secondary | ICD-10-CM | POA: Insufficient documentation

## 2020-07-24 DIAGNOSIS — E1122 Type 2 diabetes mellitus with diabetic chronic kidney disease: Secondary | ICD-10-CM | POA: Insufficient documentation

## 2020-07-24 DIAGNOSIS — Z79899 Other long term (current) drug therapy: Secondary | ICD-10-CM | POA: Insufficient documentation

## 2020-07-24 DIAGNOSIS — Z794 Long term (current) use of insulin: Secondary | ICD-10-CM | POA: Diagnosis not present

## 2020-07-24 DIAGNOSIS — E1140 Type 2 diabetes mellitus with diabetic neuropathy, unspecified: Secondary | ICD-10-CM | POA: Diagnosis not present

## 2020-07-24 DIAGNOSIS — R600 Localized edema: Secondary | ICD-10-CM | POA: Diagnosis not present

## 2020-07-24 DIAGNOSIS — E1151 Type 2 diabetes mellitus with diabetic peripheral angiopathy without gangrene: Secondary | ICD-10-CM | POA: Diagnosis not present

## 2020-07-24 DIAGNOSIS — L03032 Cellulitis of left toe: Secondary | ICD-10-CM | POA: Diagnosis not present

## 2020-07-24 DIAGNOSIS — M7989 Other specified soft tissue disorders: Secondary | ICD-10-CM | POA: Diagnosis not present

## 2020-07-24 DIAGNOSIS — R4182 Altered mental status, unspecified: Secondary | ICD-10-CM | POA: Diagnosis present

## 2020-07-24 DIAGNOSIS — L0390 Cellulitis, unspecified: Secondary | ICD-10-CM | POA: Diagnosis present

## 2020-07-24 LAB — URINALYSIS, ROUTINE W REFLEX MICROSCOPIC
Bilirubin Urine: NEGATIVE
Glucose, UA: >=500 mg/dL — AB
Ketones, ur: NEGATIVE mg/dL
Leukocytes,Ua: NEGATIVE
Nitrite: POSITIVE — AB
Protein, ur: 100 mg/dL — AB
Specific Gravity, Urine: 1.02 (ref 1.005–1.030)
pH: 6 (ref 5.0–8.0)

## 2020-07-24 LAB — I-STAT VENOUS BLOOD GAS, ED
Acid-Base Excess: 4 mmol/L — ABNORMAL HIGH (ref 0.0–2.0)
Bicarbonate: 29.3 mmol/L — ABNORMAL HIGH (ref 20.0–28.0)
Calcium, Ion: 1.12 mmol/L — ABNORMAL LOW (ref 1.15–1.40)
HCT: 30 % — ABNORMAL LOW (ref 36.0–46.0)
Hemoglobin: 10.2 g/dL — ABNORMAL LOW (ref 12.0–15.0)
O2 Saturation: 97 %
Potassium: 4.4 mmol/L (ref 3.5–5.1)
Sodium: 138 mmol/L (ref 135–145)
TCO2: 31 mmol/L (ref 22–32)
pCO2, Ven: 43.8 mmHg — ABNORMAL LOW (ref 44.0–60.0)
pH, Ven: 7.433 — ABNORMAL HIGH (ref 7.250–7.430)
pO2, Ven: 90 mmHg — ABNORMAL HIGH (ref 32.0–45.0)

## 2020-07-24 LAB — CBG MONITORING, ED
Glucose-Capillary: 361 mg/dL — ABNORMAL HIGH (ref 70–99)
Glucose-Capillary: 311 mg/dL — ABNORMAL HIGH (ref 70–99)

## 2020-07-24 LAB — RAPID URINE DRUG SCREEN, HOSP PERFORMED
Amphetamines: NOT DETECTED
Barbiturates: NOT DETECTED
Benzodiazepines: NOT DETECTED
Cocaine: NOT DETECTED
Opiates: NOT DETECTED
Tetrahydrocannabinol: NOT DETECTED

## 2020-07-24 LAB — URINALYSIS, MICROSCOPIC (REFLEX)

## 2020-07-24 LAB — CBC
HCT: 29.4 % — ABNORMAL LOW (ref 36.0–46.0)
Hemoglobin: 9.9 g/dL — ABNORMAL LOW (ref 12.0–15.0)
MCH: 32.7 pg (ref 26.0–34.0)
MCHC: 33.7 g/dL (ref 30.0–36.0)
MCV: 97 fL (ref 80.0–100.0)
Platelets: 312 K/uL (ref 150–400)
RBC: 3.03 MIL/uL — ABNORMAL LOW (ref 3.87–5.11)
RDW: 14 % (ref 11.5–15.5)
WBC: 6.1 K/uL (ref 4.0–10.5)
nRBC: 0 % (ref 0.0–0.2)

## 2020-07-24 LAB — BASIC METABOLIC PANEL WITH GFR
Anion gap: 9 (ref 5–15)
BUN: 32 mg/dL — ABNORMAL HIGH (ref 8–23)
CO2: 27 mmol/L (ref 22–32)
Calcium: 9.1 mg/dL (ref 8.9–10.3)
Chloride: 98 mmol/L (ref 98–111)
Creatinine, Ser: 2 mg/dL — ABNORMAL HIGH (ref 0.44–1.00)
GFR, Estimated: 28 mL/min — ABNORMAL LOW (ref >60)
Glucose, Bld: 381 mg/dL — ABNORMAL HIGH (ref 70–99)
Potassium: 4.3 mmol/L (ref 3.5–5.1)
Sodium: 134 mmol/L — ABNORMAL LOW (ref 135–145)

## 2020-07-24 LAB — LACTIC ACID, PLASMA
Lactic Acid, Venous: 1.4 mmol/L (ref 0.5–1.9)
Lactic Acid, Venous: 1.3 mmol/L (ref 0.5–1.9)

## 2020-07-24 LAB — AMMONIA: Ammonia: 30 umol/L (ref 9–35)

## 2020-07-24 LAB — SARS CORONAVIRUS 2 (TAT 6-24 HRS): SARS Coronavirus 2: NEGATIVE

## 2020-07-24 MED ORDER — INSULIN ASPART 100 UNIT/ML ~~LOC~~ SOLN
0.0000 [IU] | Freq: Three times a day (TID) | SUBCUTANEOUS | Status: DC
  Administered 2020-07-24: 7 [IU] via SUBCUTANEOUS
  Administered 2020-07-25 (×2): 3 [IU] via SUBCUTANEOUS

## 2020-07-24 MED ORDER — HEPARIN SODIUM (PORCINE) 5000 UNIT/ML IJ SOLN
5000.0000 [IU] | Freq: Three times a day (TID) | INTRAMUSCULAR | Status: DC
  Administered 2020-07-24 – 2020-07-25 (×4): 5000 [IU] via SUBCUTANEOUS
  Filled 2020-07-24 (×4): qty 1

## 2020-07-24 MED ORDER — BENZTROPINE MESYLATE 1 MG PO TABS
1.0000 mg | ORAL_TABLET | Freq: Every day | ORAL | Status: DC
  Administered 2020-07-24: 1 mg via ORAL
  Filled 2020-07-24 (×2): qty 1

## 2020-07-24 MED ORDER — BACID PO TABS
2.0000 | ORAL_TABLET | Freq: Three times a day (TID) | ORAL | Status: DC
  Administered 2020-07-25 (×2): 2 via ORAL
  Filled 2020-07-24 (×4): qty 2

## 2020-07-24 MED ORDER — DULOXETINE HCL 60 MG PO CPEP
60.0000 mg | ORAL_CAPSULE | Freq: Two times a day (BID) | ORAL | Status: DC
  Administered 2020-07-24 – 2020-07-25 (×2): 60 mg via ORAL
  Filled 2020-07-24 (×3): qty 1

## 2020-07-24 MED ORDER — GABAPENTIN 600 MG PO TABS
300.0000 mg | ORAL_TABLET | Freq: Two times a day (BID) | ORAL | Status: DC
Start: 2020-07-24 — End: 2020-07-26
  Administered 2020-07-24 – 2020-07-25 (×2): 300 mg via ORAL
  Filled 2020-07-24 (×3): qty 1
  Filled 2020-07-24: qty 0.5

## 2020-07-24 MED ORDER — FLUTICASONE PROPIONATE 50 MCG/ACT NA SUSP
1.0000 | Freq: Every day | NASAL | Status: DC
  Administered 2020-07-24 – 2020-07-25 (×2): 1 via NASAL
  Filled 2020-07-24: qty 16

## 2020-07-24 MED ORDER — INSULIN ASPART 100 UNIT/ML ~~LOC~~ SOLN
50.0000 [IU] | Freq: Three times a day (TID) | SUBCUTANEOUS | Status: DC
  Administered 2020-07-25 (×2): 50 [IU] via SUBCUTANEOUS

## 2020-07-24 MED ORDER — LINAGLIPTIN 5 MG PO TABS
5.0000 mg | ORAL_TABLET | Freq: Every day | ORAL | Status: DC
  Administered 2020-07-24 – 2020-07-25 (×2): 5 mg via ORAL
  Filled 2020-07-24 (×2): qty 1

## 2020-07-24 MED ORDER — FUROSEMIDE 40 MG PO TABS
40.0000 mg | ORAL_TABLET | Freq: Two times a day (BID) | ORAL | Status: DC
  Administered 2020-07-24 – 2020-07-25 (×3): 40 mg via ORAL
  Filled 2020-07-24 (×3): qty 1

## 2020-07-24 MED ORDER — INSULIN ASPART 100 UNIT/ML ~~LOC~~ SOLN: 0.0000 [IU] | Freq: Every day | SUBCUTANEOUS | Status: DC

## 2020-07-24 MED ORDER — PANTOPRAZOLE SODIUM 40 MG PO TBEC
40.0000 mg | DELAYED_RELEASE_TABLET | Freq: Every day | ORAL | Status: DC
  Administered 2020-07-24 – 2020-07-25 (×2): 40 mg via ORAL
  Filled 2020-07-24: qty 1

## 2020-07-24 MED ORDER — INSULIN GLARGINE 100 UNIT/ML ~~LOC~~ SOLN
10.0000 [IU] | Freq: Every day | SUBCUTANEOUS | Status: DC
  Administered 2020-07-24 – 2020-07-25 (×2): 10 [IU] via SUBCUTANEOUS
  Filled 2020-07-24 (×2): qty 0.1

## 2020-07-24 MED ORDER — ATORVASTATIN CALCIUM 40 MG PO TABS
40.0000 mg | ORAL_TABLET | Freq: Every day | ORAL | Status: DC
  Administered 2020-07-24 – 2020-07-25 (×2): 40 mg via ORAL
  Filled 2020-07-24: qty 1

## 2020-07-24 MED ORDER — VITAMIN B-12 1000 MCG PO TABS
1000.0000 ug | ORAL_TABLET | Freq: Every day | ORAL | Status: DC
  Administered 2020-07-24 – 2020-07-25 (×2): 1000 ug via ORAL
  Filled 2020-07-24: qty 1

## 2020-07-24 MED ORDER — FERROUS SULFATE 325 (65 FE) MG PO TABS
325.0000 mg | ORAL_TABLET | Freq: Every day | ORAL | Status: DC
  Administered 2020-07-24 – 2020-07-25 (×2): 325 mg via ORAL
  Filled 2020-07-24 (×2): qty 1

## 2020-07-24 MED ORDER — ASPIRIN EC 81 MG PO TBEC
81.0000 mg | DELAYED_RELEASE_TABLET | Freq: Every day | ORAL | Status: DC
  Administered 2020-07-25: 81 mg via ORAL
  Filled 2020-07-24: qty 1

## 2020-07-24 MED ORDER — BUSPIRONE HCL 5 MG PO TABS
20.0000 mg | ORAL_TABLET | Freq: Three times a day (TID) | ORAL | Status: DC
  Administered 2020-07-24 – 2020-07-25 (×3): 20 mg via ORAL
  Filled 2020-07-24 (×3): qty 1

## 2020-07-24 MED ORDER — LAMOTRIGINE 100 MG PO TABS
100.0000 mg | ORAL_TABLET | Freq: Two times a day (BID) | ORAL | Status: DC
  Administered 2020-07-24 – 2020-07-25 (×2): 100 mg via ORAL
  Filled 2020-07-24 (×3): qty 1

## 2020-07-24 MED ORDER — BENZTROPINE MESYLATE 1 MG PO TABS: 0.5000 mg | ORAL_TABLET | ORAL | Status: DC

## 2020-07-24 MED ORDER — BENZTROPINE MESYLATE 0.5 MG PO TABS
0.5000 mg | ORAL_TABLET | Freq: Every day | ORAL | Status: DC
  Administered 2020-07-25: 0.5 mg via ORAL
  Filled 2020-07-24: qty 1

## 2020-07-24 MED ORDER — QUETIAPINE FUMARATE 50 MG PO TABS
200.0000 mg | ORAL_TABLET | Freq: Every day | ORAL | Status: DC
  Administered 2020-07-24: 200 mg via ORAL
  Filled 2020-07-24: qty 1
  Filled 2020-07-24: qty 4

## 2020-07-24 MED ORDER — CLINDAMYCIN PHOSPHATE 600 MG/50ML IV SOLN
600.0000 mg | Freq: Three times a day (TID) | INTRAVENOUS | Status: DC
  Administered 2020-07-24 – 2020-07-25 (×4): 600 mg via INTRAVENOUS
  Filled 2020-07-24 (×4): qty 50

## 2020-07-24 MED ORDER — LAMOTRIGINE ER 200 MG PO TB24: 1.0000 | ORAL_TABLET | Freq: Every day | ORAL | Status: DC

## 2020-07-24 NOTE — Consult Note (Signed)
Reason for Consult:Left foot ischemia Referring Physician: Rosalyn Gess Time called: 8101 Time at bedside: 1524   Dana [name] is an 65 y.o. female.  HPI: Dana [name] comes in at the recommendation of endocrine MD for worsening foot cellulitis. She also was very somnolent. She was due to see Dr. Sharol Given as OP but didn't make that appointment. She is too somnolent currently to contribute to history or physical exam.   Past Medical History:  Diagnosis Date  . Bipolar 1 disorder ([place])   . Cellulitis and abscess of foot 03/08/2015  . Diabetes mellitus    INSULIN DEPENDENT  . Diabetic neuropathy ([place])   . 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 H/O hiatal hernia   . Hyperlipidemia   . Hypertension    past hx of  . Iron malabsorption 09/26/2015  . Numbness and tingling    Hx; of in B/LLE and B/LUE  . Other iron deficiency anemias 09/20/2015    Past Surgical History:  Procedure Laterality Date  . ABDOMINAL HYSTERECTOMY    . ADENOIDECTOMY     Hx: of  . AMPUTATION  10/24/2011   Procedure: AMPUTATION RAY;  Surgeon: Newt Minion, MD;  Location: [facility];  Service: Orthopedics;  Laterality: Right;  Right Foot 3rd Ray Amputation  . AMPUTATION Right 12/16/2012   Procedure: Right Foot Transmetatarsal Amputation;  Surgeon: Newt Minion, MD;  Location: [facility];  Service: Orthopedics;  Laterality: Right;  . AMPUTATION Left 03/09/2015   Procedure: LEFT FOOT 1ST RAY AMPUTATION;  Surgeon: Newt Minion, MD;  Location: [facility];  Service: Orthopedics;  Laterality: Left;  . BLADDER SURGERY     x 2, tacked 1st time; mesh "eroded", had to be removed  . Bladder Tact   2002  . BREAST SURGERY Left    "knot removed"  . CARPAL TUNNEL RELEASE Left   . COLON SURGERY    . ESOPHAGOGASTRODUODENOSCOPY (EGD) WITH PROPOFOL N/A 04/03/2020   Procedure: ESOPHAGOGASTRODUODENOSCOPY (EGD) WITH PROPOFOL;  Surgeon: Lavena Bullion, DO;  Location: [facility];  Service: Gastroenterology;  Laterality: N/A;  . EYE SURGERY   11/17/2017  . NASAL SEPTUM SURGERY  1976  . POLYPECTOMY  04/03/2020   Procedure: POLYPECTOMY;  Surgeon: Lavena Bullion, DO;  Location: [facility];  Service: Gastroenterology;;  . TONSILLECTOMY     age 33's    Family History  Problem Relation Age of Onset  . Diabetes Mother   . Mental illness Mother   . Bipolar disorder Mother   . Hypertension Father   . Osteoarthritis Father   . Heart disease Father   . Diabetes Sister   . Early death Brother        MVA  . Healthy Sister   . Diabetes Maternal Grandmother     Social History:  reports that she has never smoked. She has never used smokeless tobacco. She reports that she does not drink alcohol and does not use drugs.  Allergies:  Allergies  Allergen Reactions  . Ativan [Lorazepam] Anaphylaxis       . Codeine Itching and Other (See Comments)    hallucinations  . Demerol [Meperidine] Anaphylaxis  . Keflex [Cephalexin] Shortness Of Breath and Rash    Tremors  . Lithium Nausea And Vomiting and Other (See Comments)    Can not keep this medication down. It makes her terribly ill.  . Doxycycline Other (See Comments)    Severe muscle tremor  . Oxycodone Other (See Comments)    Other reaction(s): OTHER Abnormal  behavior  . Darvocet [Propoxyphene N-Acetaminophen] Itching  . Latex Itching    Other reaction(s): OTHER  . Propoxyphene Itching    Medications: I have reviewed the patient's current medications.  Results for orders placed or performed during the hospital encounter of 07/24/20 (from the past 48 hour(s))  CBG monitoring, ED     Status: Abnormal   Collection Time: 07/24/20 11:50 AM  Result Value Ref Range   Glucose-Capillary 361 (H) 70 - 99 mg/dL    Comment: Glucose reference range applies only to samples taken after fasting for at least 8 hours.   Comment 1 Notify RN    Comment 2 Document in Chart   Basic metabolic panel     Status: Abnormal   Collection Time: 07/24/20 12:35 PM  Result Value Ref Range    Sodium 134 (L) 135 - 145 mmol/L   Potassium 4.3 3.5 - 5.1 mmol/L   Chloride 98 98 - 111 mmol/L   CO2 27 22 - 32 mmol/L   Glucose, Bld 381 (H) 70 - 99 mg/dL    Comment: Glucose reference range applies only to samples taken after fasting for at least 8 hours.   BUN 32 (H) 8 - 23 mg/dL   Creatinine, Ser 2.00 (H) 0.44 - 1.00 mg/dL   Calcium 9.1 8.9 - 10.3 mg/dL   GFR, Estimated 28 (L) >60 mL/min    Comment: (NOTE) Calculated using the CKD-EPI Creatinine Equation (2021)    Anion gap 9 5 - 15    Comment: Performed at [facility] [address], [place], [place] [address]  CBC     Status: Abnormal   Collection Time: 07/24/20 12:35 PM  Result Value Ref Range   WBC 6.1 4.0 - 10.5 K/uL   RBC 3.03 (L) 3.87 - 5.11 MIL/uL   Hemoglobin 9.9 (L) 12.0 - 15.0 g/dL   HCT 29.4 (L) 36.0 - 46.0 %   MCV 97.0 80.0 - 100.0 fL   MCH 32.7 26.0 - 34.0 pg   MCHC 33.7 30.0 - 36.0 g/dL   RDW 14.0 11.5 - 15.5 %   Platelets 312 150 - 400 K/uL   nRBC 0.0 0.0 - 0.2 %    Comment: Performed at [facility], [place] [address] [place], [place] [address]  Urinalysis, Routine w reflex microscopic     Status: Abnormal   Collection Time: 07/24/20  1:36 PM  Result Value Ref Range   Color, Urine YELLOW YELLOW   APPearance CLEAR CLEAR   Specific Gravity, Urine 1.020 1.005 - 1.030   pH 6.0 5.0 - 8.0   Glucose, UA >=500 (A) NEGATIVE mg/dL   Hgb urine dipstick TRACE (A) NEGATIVE   Bilirubin Urine NEGATIVE NEGATIVE   Ketones, ur NEGATIVE NEGATIVE mg/dL   Protein, ur 100 (A) NEGATIVE mg/dL   Nitrite POSITIVE (A) NEGATIVE   Leukocytes,Ua NEGATIVE NEGATIVE    Comment: Performed at [facility] [address], [place], [place] [address]  Urinalysis, Microscopic (reflex)     Status: Abnormal   Collection Time: 07/24/20  1:36 PM  Result Value Ref Range   RBC / HPF 0-5 0 - 5 RBC/hpf   WBC, UA 21-50 0 - 5 WBC/hpf   Bacteria, UA MANY (A) NONE SEEN   Squamous Epithelial / LPF 6-10 0 - 5   WBC Clumps  PRESENT    Mucus PRESENT     Comment: Performed at [facility], [place] [address], [place], [place] [address]  Ammonia     Status: None   Collection Time: 07/24/20  1:55 PM  Result Value Ref Range   Ammonia 30 9 - 35 umol/L    Comment: Performed at [facility], [place] [address] [place], [place] [address]  I-Stat venous blood gas, ED     Status: Abnormal   Collection Time: 07/24/20  3:16 PM  Result Value Ref Range   pH, Ven 7.433 (H) 7.250 - 7.430   pCO2, Ven 43.8 (L) 44.0 - 60.0 mmHg   pO2, Ven 90.0 (H) 32.0 - 45.0 mmHg   Bicarbonate 29.3 (H) 20.0 - 28.0 mmol/L   TCO2 31 22 - 32 mmol/L   O2 Saturation 97.0 %   Acid-Base Excess 4.0 (H) 0.0 - 2.0 mmol/L   Sodium 138 135 - 145 mmol/L   Potassium 4.4 3.5 - 5.1 mmol/L   Calcium, Ion 1.12 (L) 1.15 - 1.40 mmol/L   HCT 30.0 (L) 36.0 - 46.0 %   Hemoglobin 10.2 (L) 12.0 - 15.0 g/dL   Sample type VENOUS     DG Foot 2 Views Left  Result Date: 07/24/2020 CLINICAL DATA:  Left foot wound. EXAM: LEFT FOOT - 2 VIEW COMPARISON:  July 20, 2020. FINDINGS: Status post surgical resection of the first ray. 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 Moderate posterior calcaneal spurring is noted. No acute fracture is noted. No definite lytic destruction is seen to suggest osteomyelitis. Plantar soft tissue swelling is noted. No radiopaque foreign body is noted. IMPRESSION: Status post surgical resection of the first ray. 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 No definite lytic destruction is seen to suggest osteomyelitis. Electronically Signed   By: Marijo Conception M.D.   On: 07/24/2020 14:51    Review of Systems  Unable to perform ROS: Mental status change   Blood pressure (!) 147/90, pulse 92, temperature 98.1 F (36.7 C), resp. rate 18, SpO2 100 %. Physical Exam Constitutional:      General: She is not in acute distress.    Appearance: She is well-developed. She is not  diaphoretic.  HENT:     Head: Normocephalic and atraumatic.  Eyes:     General: No scleral icterus.       Right eye: No discharge.        Left eye: No discharge.     Conjunctiva/sclera: Conjunctivae normal.  Cardiovascular:     Rate and Rhythm: Normal rate and regular rhythm.  Pulmonary:     Effort: Pulmonary effort is normal. No respiratory distress.  Musculoskeletal:     Cervical back: Normal range of motion.  Feet:     Comments: Left foot: S/p hallux amputation, necrotic appearance 2-5 toes with epidermal slough to midfoot and necrotic appearing wound edges. Pt too somnolent to assess sensation. DP/PT 2+. Skin:    General: Skin is warm and dry.  Neurological:     Mental Status: She is alert.  Psychiatric:        Behavior: Behavior normal.     Assessment/Plan: Left foot cellulitis -- Dr. Sharol Given to evaluate later today or in AM. Multiple medical problems including IDDM with insulin resistance, diabetic neuropathy, status post right BKA, CKD stage III, status post left first toe and metatarsal amputation, and anxiety/depression -- per primary service    Lisette Abu, PA-C Orthopedic Surgery [phone] 07/24/2020, 3:33 PM

## 2020-07-24 NOTE — ED Provider Notes (Incomplete)
I provided a substantive portion of the care of this patient.  I personally performed the entirety of the history, exam and medical decision making for this encounter.       

## 2020-07-24 NOTE — H&P (Addendum)
History and Physical    Dana [name] GHW:[id number] DOB: Jun 27, 1955 DOA: 07/24/2020  PCP: [name], DO (Confirm with patient/family/NH records and if not entered, this has to be entered at [facility] point of entry) Patient coming from: Home I have personally briefly reviewed patient's old medical records in [place]  Chief Complaint: AMS  HPI: Dana [name] is a 65 y.o. female with medical history significant of IDDM with insulin resistance, diabetic neuropathy, status post right BKA, CKD stage III, status post left first toe and metatarsal amputation, anxiety/depression, presented with altered mentation.  Patient lives with her sister, who is presented at bedside.  Sister took patient to her regular endocrinology visit today, and was sent by endocrinology to ED for lowering about worsening of the left foot cellulitis/infection.  3 days ago, patient was hospitalized for same foot cellulitis, x-ray was negative for deep tissue infection, and patient was evaluated by orthopedic surgery and cleared for discharge.  Patient during that admission received 1 day of IV clindamycin and switch to p.o. clindamycin 300 every 8 hours.  Sister however concerned about the patient has not been compliant with her antibiotics regimen, and sister also suspect patient has not been compliant with her diet and insulin regimen as her glucose today is 387.  Since yesterday evening, patient has become lethargic and sleepy.  Denies any fever chills, she has chronic neuropathy, Charcot foot and does not feel any pain of the left foot. ED Course: WBC 6.1, no left shift, foot x-ray pending, glucose 381, creatinine 2.0.  Review of Systems: Unable to perform, patient lethargic.  Past Medical History:  Diagnosis Date  . Bipolar 1 disorder ([place])   . Cellulitis and abscess of foot 03/08/2015  . Diabetes mellitus    INSULIN DEPENDENT  . Diabetic neuropathy ([place])   . 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 H/O  hiatal hernia   . Hyperlipidemia   . Hypertension    past hx of  . Iron malabsorption 09/26/2015  . Numbness and tingling    Hx; of in B/LLE and B/LUE  . Other iron deficiency anemias 09/20/2015    Past Surgical History:  Procedure Laterality Date  . ABDOMINAL HYSTERECTOMY    . ADENOIDECTOMY     Hx: of  . AMPUTATION  10/24/2011   Procedure: AMPUTATION RAY;  Surgeon: Newt Minion, MD;  Location: [place];  Service: Orthopedics;  Laterality: Right;  Right Foot 3rd Ray Amputation  . AMPUTATION Right 12/16/2012   Procedure: Right Foot Transmetatarsal Amputation;  Surgeon: Newt Minion, MD;  Location: [place];  Service: Orthopedics;  Laterality: Right;  . AMPUTATION Left 03/09/2015   Procedure: LEFT FOOT 1ST RAY AMPUTATION;  Surgeon: Newt Minion, MD;  Location: [place];  Service: Orthopedics;  Laterality: Left;  . BLADDER SURGERY     x 2, tacked 1st time; mesh "eroded", had to be removed  . Bladder Tact   2002  . BREAST SURGERY Left    "knot removed"  . CARPAL TUNNEL RELEASE Left   . COLON SURGERY    . ESOPHAGOGASTRODUODENOSCOPY (EGD) WITH PROPOFOL N/A 04/03/2020   Procedure: ESOPHAGOGASTRODUODENOSCOPY (EGD) WITH PROPOFOL;  Surgeon: Lavena Bullion, DO;  Location: [facility];  Service: Gastroenterology;  Laterality: N/A;  . EYE SURGERY  11/17/2017  . NASAL SEPTUM SURGERY  1976  . POLYPECTOMY  04/03/2020   Procedure: POLYPECTOMY;  Surgeon: Lavena Bullion, DO;  Location: [facility];  Service: Gastroenterology;;  . Darrick Huntsman  age 78's     reports that she has never smoked. She has never used smokeless tobacco. She reports that she does not drink alcohol and does not use drugs.  Allergies  Allergen Reactions  . Ativan [Lorazepam] Anaphylaxis       . Codeine Itching and Other (See Comments)    hallucinations  . Demerol [Meperidine] Anaphylaxis  . Keflex [Cephalexin] Shortness Of Breath and Rash    Tremors  . Lithium Nausea And Vomiting and Other (See Comments)     Can not keep this medication down. It makes her terribly ill.  . Doxycycline Other (See Comments)    Severe muscle tremor  . Oxycodone Other (See Comments)    Other reaction(s): OTHER Abnormal behavior  . Darvocet [Propoxyphene N-Acetaminophen] Itching  . Latex Itching    Other reaction(s): OTHER  . Propoxyphene Itching    Family History  Problem Relation Age of Onset  . Diabetes Mother   . Mental illness Mother   . Bipolar disorder Mother   . Hypertension Father   . Osteoarthritis Father   . Heart disease Father   . Diabetes Sister   . Early death Brother        MVA  . Healthy Sister   . Diabetes Maternal Grandmother      Prior to Admission medications   Medication Sig Start Date End Date Taking? Authorizing Provider  Apoaequorin (PREVAGEN PO) Take 1 capsule by mouth daily.    [provider]  aspirin 81 MG EC tablet Take 1 tablet (81 mg total) by mouth daily. Swallow whole. 04/12/20   Eugenie Filler, MD  atorvastatin (LIPITOR) 40 MG tablet Take 40 mg by mouth daily. 06/27/20   [provider]  BD VEO INSULIN SYRINGE U/F 31G X 15/64" 1 ML MISC Administer insulin up 3 times per day per sliding scale E.11.9 07/20/18   Scot Jun, FNP  benztropine (COGENTIN) 1 MG tablet Take 0.5-1 mg by mouth See admin instructions. Takes 1 mg in the night and 0.5 mg at night    [provider]  busPIRone (BUSPAR) 10 MG tablet Take 20 mg by mouth 3 (three) times daily.    [provider]  clindamycin (CLEOCIN) 300 MG capsule Take 1 capsule (300 mg total) by mouth 3 (three) times daily for 10 days. 07/21/20 07/31/20  Mercy Riding, MD  Continuous Blood Gluc Sensor (FREESTYLE LIBRE 14 DAY SENSOR) MISC SMARTSIG:1 Each Topical Every 2 Weeks 07/17/19   [provider]  DULoxetine (CYMBALTA) 60 MG capsule Take 60 mg by mouth 2 (two) times daily. 06/23/20   [provider]  Ferrous Sulfate (IRON) 325 (65 Fe) MG TABS Take 325 mg by mouth daily.      [provider]  fluticasone (FLONASE) 50 MCG/ACT nasal spray Place 1 spray into both nostrils daily. 06/26/20   [provider]  furosemide (LASIX) 40 MG tablet Take 1 tablet (40 mg total) by mouth 2 (two) times daily. 07/24/20   Mercy Riding, MD  gabapentin (NEURONTIN) 600 MG tablet Take 600 mg by mouth 2 (two) times daily.     [provider]  glucose blood (FREESTYLE LITE) test strip USE AS INSTRUCTED TO CHECK BLOOD SUGAR 3 TIMES DAILY. DX:E11.65 07/20/18   Scot Jun, FNP  LamoTRIgine 200 MG TB24 24 hour tablet Take 1 tablet by mouth daily. 06/16/20   [provider]  Lancets (FREESTYLE) lancets Use as instructed to check blood sugar 3 times per  day dx code E11.39 07/20/18   Scot Jun, FNP  metFORMIN (GLUCOPHAGE-XR) 500 MG 24 hr tablet TAKE 2 TABLETS IN THE MORNING AND 2 TABLETS AT NIGHT. Patient taking differently: Take 1,000 mg by mouth 2 (two) times daily. 12/09/17   Elayne Snare, MD  Multiple Vitamins-Minerals (CENTRUM ADULTS PO) Take 1 tablet by mouth daily.    [provider]  NOVOLIN N RELION 100 UNIT/ML injection INJECT 60 UNITS AM and 50 units PM SUBCUTANEOUSLY TWICE DAILY FOR 30 DAYS Patient taking differently: Inject 50-75 Units into the skin See admin instructions. Take 75 units with breakfast, 75 units with lunch, and 50 units with supper-dose confirmed with Dr. Almetta Lovely office 07/20/18   Scot Jun, FNP  NOVOLOG FLEXPEN RELION 100 UNIT/ML FlexPen Inject 50 Units into the skin 3 (three) times daily. 05/04/20   [provider]  Ostomy Supplies (SKIN TAC ADHESIVE BARRIER WIPE) MISC 1 each by Does not apply route daily. 11/25/17   Elayne Snare, MD  pantoprazole (PROTONIX) 40 MG tablet Take 1 tablet (40 mg total) by mouth daily. 07/20/18   Scot Jun, FNP  QUEtiapine (SEROQUEL) 200 MG tablet Take 200 mg by mouth at bedtime. 06/27/20   [provider]  vitamin B-12 (CYANOCOBALAMIN) 1000 MCG tablet Take 1  tablet (1,000 mcg total) by mouth daily. 04/05/20   Eugenie Filler, MD    Physical Exam: Vitals:   07/24/20 1137  BP: (!) 131/96  Pulse: 91  Resp: 16  Temp: 98.1 F (36.7 C)  SpO2: 98%    Constitutional: NAD, calm, comfortable Vitals:   07/24/20 1137  BP: (!) 131/96  Pulse: 91  Resp: 16  Temp: 98.1 F (36.7 C)  SpO2: 98%   Eyes: PERRL, lids and conjunctivae normal ENMT: Mucous membranes are moist. Posterior pharynx clear of any exudate or lesions.Normal dentition.  Neck: normal, supple, no masses, no thyromegaly Respiratory: clear to auscultation bilaterally, no wheezing, no crackles. Normal respiratory effort. No accessory muscle use.  Cardiovascular: Regular rate and rhythm, no murmurs / rubs / gallops. No extremity edema. 2+ pedal pulses. No carotid bruits.  Abdomen: no tenderness, no masses palpated. No hepatosplenomegaly. Bowel sounds positive.  Musculoskeletal: no clubbing / cyanosis. No joint deformity upper and lower extremities. Good ROM, no contractures. Normal muscle tone.  Skin: Left foot medial side of cellulitis-like changes, no pus, no significant increased of local temperature. Neurologic: CN 2-12 grossly intact. Sensation intact, DTR normal. Strength 5/5 in all 4.  Psychiatric: Normal judgment and insight. Alert and oriented x 3. Normal mood.            Labs on Admission: I have personally reviewed following labs and imaging studies  CBC: Recent Labs  Lab 07/20/20 2002 07/21/20 0206 07/24/20 1235  WBC 7.1 5.6 6.1  HGB 10.4* 10.3* 9.9*  HCT 31.2* 30.9* 29.4*  MCV 97.8 96.9 97.0  PLT 357 347 630   Basic Metabolic Panel: Recent Labs  Lab 07/20/20 2002 07/21/20 0206 07/24/20 1235  NA 137 137 134*  K 4.3 4.2 4.3  CL 101 101 98  CO2 25 25 27   GLUCOSE 63* 207* 381*  BUN 33* 27* 32*  CREATININE 2.02* 1.87* 2.00*  CALCIUM 9.4 9.3 9.1   GFR: Estimated Creatinine Clearance: 32.4 mL/min (A) (by C-G formula based on SCr of 2 mg/dL  (H)). Liver Function Tests: Recent Labs  Lab 07/20/20 2002  AST 31  ALT 31  ALKPHOS 91  BILITOT 0.7  PROT 7.2  ALBUMIN  3.8   No results for input(s): LIPASE, AMYLASE in the last 168 hours. No results for input(s): AMMONIA in the last 168 hours. Coagulation Profile: No results for input(s): INR, PROTIME in the last 168 hours. Cardiac Enzymes: No results for input(s): CKTOTAL, CKMB, CKMBINDEX, TROPONINI in the last 168 hours. BNP (last 3 results) No results for input(s): PROBNP in the last 8760 hours. HbA1C: No results for input(s): HGBA1C in the last 72 hours. CBG: Recent Labs  Lab 07/20/20 2110 07/21/20 0046 07/21/20 0808 07/21/20 1128 07/24/20 1150  GLUCAP 103* 209* 163* 215* 361*   Lipid Profile: No results for input(s): CHOL, HDL, LDLCALC, TRIG, CHOLHDL, LDLDIRECT in the last 72 hours. Thyroid Function Tests: No results for input(s): TSH, T4TOTAL, FREET4, T3FREE, THYROIDAB in the last 72 hours. Anemia Panel: No results for input(s): VITAMINB12, FOLATE, FERRITIN, TIBC, IRON, RETICCTPCT in the last 72 hours. Urine analysis:    Component Value Date/Time   COLORURINE YELLOW 04/02/2020 1241   APPEARANCEUR CLOUDY (A) 04/02/2020 1241   LABSPEC 1.020 04/02/2020 1241   PHURINE 6.0 04/02/2020 1241   GLUCOSEU >=500 (A) 04/02/2020 1241   GLUCOSEU NEGATIVE 10/01/2016 1531   HGBUR SMALL (A) 04/02/2020 1241   BILIRUBINUR NEGATIVE 04/02/2020 1241   BILIRUBINUR negative 07/20/2018 1607   BILIRUBINUR neg 02/12/2016 1350   KETONESUR NEGATIVE 04/02/2020 1241   PROTEINUR 100 (A) 04/02/2020 1241   UROBILINOGEN 1.0 07/20/2018 1607   UROBILINOGEN 0.2 10/01/2016 1531   NITRITE NEGATIVE 04/02/2020 1241   LEUKOCYTESUR TRACE (A) 04/02/2020 1241    Radiological Exams on Admission: No results found.  EKG: None  Assessment/Plan Active Problems:   * No active hospital problems. *  (please populate well all problems here in Problem List. (For example, if patient is on BP meds at  home and you resume or decide to hold them, it is a problem that needs to be her. Same for CAD, COPD, HLD and so on)  Left foot cellulitis -Suspect noncompliant with antibiotics.  Patient's son bringing in pillbox for pill count. -Area of cellulitis more than 1/3 of the surface area of the left foot, will restart IV clindamycin and consider Pseudomonas coverage if no significant improvement. -Repeat foot x-ray, orthopedic surgery consulted in the ED, will come see the patient. -Also looks like the patient has history of Charcot foot on the left foot she does not have a ambu boot, will consult PT and wound care for ambu boot.  AMS -Likely secondary to left foot infection. -UA pending, send lactic acid, and UDS and VBG.  IDDM with hyperglycemia -Suspect noncompliant with insulin and diet, sister reported patient drinks 1 L of regular Dr. Malachi Bonds everyday. -Restart daily NovoLog 50 units 3 times daily AC, plus sliding scale, add low-dose of Lantus.  CKD stage III -Euvolemic, creatinine level stable, continue Lasix  Anxiety/Depression -Will have to talk to patient when he mentation improves to see whether there is an indication to adjust her psych meds vs psy consult.  DVT prophylaxis: Heparin subQ Code Status: Full Code Family Communication: Sister at bedside Disposition Plan: Expect 1-2 days hospital stay for IV ant Consults called: Ortho, [place] Admission status: Medsurg   Lequita Halt MD [facility] Pager [phone]  07/24/2020, 2:03 PM

## 2020-07-24 NOTE — ED Provider Notes (Signed)
[facility] EMERGENCY DEPARTMENT Provider Note   CSN: [id number] Arrival date & time: 07/24/20  1127     History No chief complaint on file.   Dana [name] is a 65 y.o. female with a history of nondependent type 2 diabetes, diabetic neuropathy, right BKA, left first toe and metatarsal amputation, CKD, hypertension, bipolar disorder.  She presents with a chief complaint of left foot infection.  Patient is somnolent on exam however arousable to voice.  Patient is found to be alert to person, place, and time.  Patient states that she came to the emergency department after seeing her "diabetic doctor earlier today."  When asked patient denies any changes in her left foot infection.  Patient denies any fevers, chills, color change, purulent discharge, nausea, vomiting.   Difficult to get further ROS due to patient's continued somnolence.  Spoke to patient's sister via phone who reports that patient took her prescribed antibiotics and insulin regimen over the weekend as she was home with her.  Patient is unsure if she has been taking these medications over the last 2 days.  Patient's sister reports that patient was brought to Dr. Chalmers Cater earlier today for diabetes management.  She reports that Dr. Chalmers Cater the patient return to emergency department due to concerns for worsening infection of left foot.  Patient sister reports that patient has increased somnolence and is waking this morning at approximately 0630.  Patient repeatedly fell asleep in her chair and was unable to dress herself.  Sister reports that patient is normally alert to person place and time.  She reports that patient suffered a fall on Sunday however denies that patient hit her head or had any loss of consciousness.  Per chart review patient was admitted to [facility] emergency department 3/11-3/04/2021 for left foot cellulitis/diabetic foot infection.  Patient received IV clindamycin and was discharged on p.o.  clindamycin.  She was to follow-up with orthopedic surgeon Dr. Sharol Given in outpatient setting.  During this admission patient had x-ray of her left foot which showed: 1. Generalized soft tissue edema. No frank bony destruction. 2. Tiny 4 mm density in the plantar soft tissues may represent foreign body or calcification. 3. Prior resection of the first ray. Multifocal joint ankylosis consistent with Charcot foot. Chronic dislocation of the second toe. 4. Chronic periosteal thickening of metatarsals most prominently involving the second which is unchanged from prior exam.  HPI     Past Medical History:  Diagnosis Date  . Bipolar 1 disorder ([place])   . Cellulitis and abscess of foot 03/08/2015  . Diabetes mellitus    INSULIN DEPENDENT  . Diabetic neuropathy ([place])   . 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 H/O hiatal hernia   . Hyperlipidemia   . Hypertension    past hx of  . Iron malabsorption 09/26/2015  . Numbness and tingling    Hx; of in B/LLE and B/LUE  . Other iron deficiency anemias 09/20/2015    Patient Active Problem List   Diagnosis Date Noted  . Cellulitis of left foot 07/21/2020  . DM (diabetes mellitus), type 2 with renal complications ([place]) 86/57/8469  . CKD (chronic kidney disease) stage 4, GFR 15-29 ml/min (HCC) 07/21/2020  . Hx of right BKA ([place]) 07/21/2020  . Cellulitis of left lower extremity   . Low vitamin B12 level 04/04/2020  . E. coli UTI 04/04/2020  . AKI (acute kidney injury) ([place])   . Generalized abdominal pain   . Benign fundic gland polyps  of stomach   . AMS (altered mental status) 04/02/2020  . Severe sepsis ([place]) 09/27/2019  . Acute respiratory failure with hypoxia ([place]) 09/27/2019  . Emesis 09/27/2019  . Hyperglycemia 09/27/2019  . Sepsis ([place]) 09/27/2019  . Encephalopathy, metabolic   . Coffee ground emesis   . Hyperkalemia   . Pneumonia 09/26/2019  . Partial nontraumatic amputation of left foot ([place]) 04/28/2017  . Acquired absence of  right leg below knee ([place]) 04/14/2017  . Pleural effusion 01/19/2017  . Contracture of left Achilles tendon 01/16/2017  . Allergic rhinitis 01/03/2017  . Gastroesophageal reflux disease without esophagitis 01/03/2017  . Hiatal hernia 02/03/2016  . Anemia associated with diabetes mellitus ([place]) 11/07/2015  . Iron malabsorption 09/26/2015  . Other iron deficiency anemia 09/20/2015  . 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 Diabetic retinopathy associated with diabetes mellitus due to underlying condition ([place]) 03/15/2015  . DM (diabetes mellitus) type II uncontrolled with eye manifestation ([place]) 11/08/2014  . Hyperlipidemia LDL goal <100 11/08/2014  . Diabetic polyneuropathy associated with type 2 diabetes mellitus ([place]) 11/08/2014  . Partial nontraumatic amputation of foot ([place]) 11/08/2014  . Type 2 diabetes mellitus without complication, with long-term current use of insulin ([place]) 10/24/2011  . Bipolar disorder ([place]) 10/24/2011    Past Surgical History:  Procedure Laterality Date  . ABDOMINAL HYSTERECTOMY    . ADENOIDECTOMY     Hx: of  . AMPUTATION  10/24/2011   Procedure: AMPUTATION RAY;  Surgeon: Newt Minion, MD;  Location: [place];  Service: Orthopedics;  Laterality: Right;  Right Foot 3rd Ray Amputation  . AMPUTATION Right 12/16/2012   Procedure: Right Foot Transmetatarsal Amputation;  Surgeon: Newt Minion, MD;  Location: [place];  Service: Orthopedics;  Laterality: Right;  . AMPUTATION Left 03/09/2015   Procedure: LEFT FOOT 1ST RAY AMPUTATION;  Surgeon: Newt Minion, MD;  Location: [place];  Service: Orthopedics;  Laterality: Left;  . BLADDER SURGERY     x 2, tacked 1st time; mesh "eroded", had to be removed  . Bladder Tact   2002  . BREAST SURGERY Left    "knot removed"  . CARPAL TUNNEL RELEASE Left   . COLON SURGERY    . ESOPHAGOGASTRODUODENOSCOPY (EGD) WITH PROPOFOL N/A 04/03/2020   Procedure: ESOPHAGOGASTRODUODENOSCOPY (EGD) WITH PROPOFOL;  Surgeon: Lavena Bullion, DO;  Location: [facility];  Service: Gastroenterology;  Laterality: N/A;  . EYE SURGERY  11/17/2017  . NASAL SEPTUM SURGERY  1976  . POLYPECTOMY  04/03/2020   Procedure: POLYPECTOMY;  Surgeon: Lavena Bullion, DO;  Location: [facility];  Service: Gastroenterology;;  . TONSILLECTOMY     age 58's     OB History   No obstetric history on file.     Family History  Problem Relation Age of Onset  . Diabetes Mother   . Mental illness Mother   . Bipolar disorder Mother   . Hypertension Father   . Osteoarthritis Father   . Heart disease Father   . Diabetes Sister   . Early death Brother        MVA  . Healthy Sister   . Diabetes Maternal Grandmother     Social History   Tobacco Use  . Smoking status: Never Smoker  . Smokeless tobacco: Never Used  Vaping Use  . Vaping Use: Never used  Substance Use Topics  . Alcohol use: No    Alcohol/week: 0.0 standard drinks  . Drug use: No    Home Medications Prior to Admission medications  Medication Sig Start Date End Date Taking? Authorizing Provider  Apoaequorin (PREVAGEN PO) Take 1 capsule by mouth daily.    [provider]  aspirin 81 MG EC tablet Take 1 tablet (81 mg total) by mouth daily. Swallow whole. 04/12/20   Eugenie Filler, MD  atorvastatin (LIPITOR) 40 MG tablet Take 40 mg by mouth daily. 06/27/20   [provider]  BD VEO INSULIN SYRINGE U/F 31G X 15/64" 1 ML MISC Administer insulin up 3 times per day per sliding scale E.11.9 07/20/18   Scot Jun, FNP  benztropine (COGENTIN) 1 MG tablet Take 0.5-1 mg by mouth See admin instructions. Takes 1 mg in the night and 0.5 mg at night    [provider]  busPIRone (BUSPAR) 10 MG tablet Take 20 mg by mouth 3 (three) times daily.    [provider]  clindamycin (CLEOCIN) 300 MG capsule Take 1 capsule (300 mg total) by mouth 3 (three) times daily for 10 days. 07/21/20 07/31/20  Mercy Riding, MD  Continuous Blood Gluc Sensor  (FREESTYLE LIBRE 14 DAY SENSOR) MISC SMARTSIG:1 Each Topical Every 2 Weeks 07/17/19   [provider]  DULoxetine (CYMBALTA) 60 MG capsule Take 60 mg by mouth 2 (two) times daily. 06/23/20   [provider]  Ferrous Sulfate (IRON) 325 (65 Fe) MG TABS Take 325 mg by mouth daily.     [provider]  fluticasone (FLONASE) 50 MCG/ACT nasal spray Place 1 spray into both nostrils daily. 06/26/20   [provider]  furosemide (LASIX) 40 MG tablet Take 1 tablet (40 mg total) by mouth 2 (two) times daily. 07/24/20   Mercy Riding, MD  gabapentin (NEURONTIN) 600 MG tablet Take 600 mg by mouth 2 (two) times daily.     [provider]  glucose blood (FREESTYLE LITE) test strip USE AS INSTRUCTED TO CHECK BLOOD SUGAR 3 TIMES DAILY. DX:E11.65 07/20/18   Scot Jun, FNP  LamoTRIgine 200 MG TB24 24 hour tablet Take 1 tablet by mouth daily. 06/16/20   [provider]  Lancets (FREESTYLE) lancets Use as instructed to check blood sugar 3 times per day dx code E11.39 07/20/18   Scot Jun, FNP  metFORMIN (GLUCOPHAGE-XR) 500 MG 24 hr tablet TAKE 2 TABLETS IN THE MORNING AND 2 TABLETS AT NIGHT. Patient taking differently: Take 1,000 mg by mouth 2 (two) times daily. 12/09/17   Elayne Snare, MD  Multiple Vitamins-Minerals (CENTRUM ADULTS PO) Take 1 tablet by mouth daily.    [provider]  NOVOLIN N RELION 100 UNIT/ML injection INJECT 60 UNITS AM and 50 units PM SUBCUTANEOUSLY TWICE DAILY FOR 30 DAYS Patient taking differently: Inject 50-75 Units into the skin See admin instructions. Take 75 units with breakfast, 75 units with lunch, and 50 units with supper-dose confirmed with Dr. Almetta Lovely office 07/20/18   Scot Jun, FNP  NOVOLOG FLEXPEN RELION 100 UNIT/ML FlexPen Inject 50 Units into the skin 3 (three) times daily. 05/04/20   [provider]  Ostomy Supplies (SKIN TAC ADHESIVE BARRIER WIPE) MISC 1 each by Does not apply route daily.  11/25/17   Elayne Snare, MD  pantoprazole (PROTONIX) 40 MG tablet Take 1 tablet (40 mg total) by mouth daily. 07/20/18   Scot Jun, FNP  QUEtiapine (SEROQUEL) 200 MG tablet Take 200 mg by mouth at bedtime. 06/27/20   [provider]  vitamin B-12 (CYANOCOBALAMIN) 1000 MCG tablet Take 1 tablet (1,000 mcg total) by mouth daily. 04/05/20  Eugenie Filler, MD    Allergies    Ativan [lorazepam], Codeine, Demerol [meperidine], Keflex [cephalexin], Lithium, Doxycycline, Oxycodone, Darvocet [propoxyphene n-acetaminophen], Latex, and Propoxyphene  Review of Systems   Review of Systems  Unable to perform ROS: Mental status change    Physical Exam Updated Vital Signs BP (!) 131/96 (BP Location: Left Arm)   Pulse 91   Temp 98.1 F (36.7 C)   Resp 16   SpO2 98%   Physical Exam Vitals and nursing note reviewed.  Constitutional:      General: She is not in acute distress.    Appearance: She is not ill-appearing, toxic-appearing or diaphoretic.  HENT:     Head: Normocephalic and atraumatic.  Eyes:     General: No scleral icterus.       Right eye: No discharge.        Left eye: No discharge.     Pupils: Pupils are equal, round, and reactive to light.  Cardiovascular:     Rate and Rhythm: Normal rate.     Pulses:          Dorsalis pedis pulses are 3+ on the left side.       Posterior tibial pulses are 3+ on the left side.  Pulmonary:     Effort: Pulmonary effort is normal. No respiratory distress.     Breath sounds: No stridor.  Musculoskeletal:     Cervical back: Neck supple.     Left foot: Charcot foot present.     Right Lower Extremity: Right leg is amputated below knee.  Feet:     Left foot:     Skin integrity: Ulcer, skin breakdown, erythema, warmth, callus and dry skin present. No blister or fissure.     Toenail Condition: Left toenails are abnormally thick.     Comments: Able to perform plantar and dorsiflexion however strength is decreased  Circumferential  erythema noted to left lower extremity.    Left foot is malodorous, first great toe amputated.  Ulceration noted to medial aspect of distal foot, second and fourth digit and plantar aspect, erythema noted to dorsal aspect of left foot  Black residue from interior of patient's shoe did throughout skin on patient's foot  Skin:    General: Skin is warm and dry.  Neurological:     General: No focal deficit present.     Mental Status: She is alert and oriented to person, place, and time.     GCS: GCS eye subscore is 3. GCS verbal subscore is 5. GCS motor subscore is 6.     Cranial Nerves: No facial asymmetry.     Comments: Patient is somnolent, arousable to voice and to be alert to person place and time when aroused however quickly falls back asleep  Patient is able to move all extremities, however generalized weakness  No facial asymmetry  Psychiatric:        Behavior: Behavior is cooperative.            ED Results / Procedures / Treatments   Labs (all labs ordered are listed, but only abnormal results are displayed) Labs Reviewed  BASIC METABOLIC PANEL - Abnormal; Notable for the following components:      Result Value   Sodium 134 (*)    Glucose, Bld 381 (*)    BUN 32 (*)    Creatinine, Ser 2.00 (*)    GFR, Estimated 28 (*)    All other components within normal limits  CBC - Abnormal; Notable  for the following components:   RBC 3.03 (*)    Hemoglobin 9.9 (*)    HCT 29.4 (*)    All other components within normal limits  CBG MONITORING, ED - Abnormal; Notable for the following components:   Glucose-Capillary 361 (*)    All other components within normal limits  CULTURE, BLOOD (ROUTINE X 2)  CULTURE, BLOOD (ROUTINE X 2)  URINE CULTURE  SARS CORONAVIRUS 2 (TAT 6-24 HRS)  URINALYSIS, ROUTINE W REFLEX MICROSCOPIC  AMMONIA    EKG None  Radiology No results found.  Procedures Procedures   Medications Ordered in ED Medications - No data to display  ED Course   I have reviewed the triage vital signs and the nursing notes.  Pertinent labs & imaging results that were available during my care of the patient were reviewed by me and considered in my medical decision making (see chart for details).    MDM Rules/Calculators/A&P                          Somnolent 65 year old female.  Arousable to voice and found to be alert to person place and time.  Patient quickly drifts to sleep during conversation.  Unable to obtain full ROS from patient due to her increased somnolence.  Patient was sent to emergency department from endocrinologist Dr. Chalmers Cater for concern of worsening left foot infection.  Patient sister reports that patient took insulin as well as antibiotics over the weekend however is unsure if she is taking these medications of the last 2 days.  Sister reports that patient developed this morning which is abnormal for her.  Due to patient's new onset of somnolence concern this may be a sign of bacteremia related to her foot infection.    Low suspicion for CVA or intracranial abnormality as patient is able to move all extremities, no slurred speech or facial drooping, pupils PERRL.    CBC showed anemia with hemoglobin and hematocrit at 9.9 and 29.4 respectively appears to be patient's baseline as previously ranged from 8.5-10.4 over the last 3 months.  No leukocytosis noted.  BMP showed BUN and creatinine elevated at 32 and 2.00 respectively.  Appears to be patient's baseline as creatinine has ranged from 1.87-2.12 and BUN has ranged from 27-46 over the last 3 months.   Glucose is elevated to 381, anion gap and bicarb within normal limits; low suspicion for DKA. Analysis pending. Will order blood culture x2, urine culture, ammonia and consult orthopedic and hospitalist for admission.  Spoke to Dr. Roosevelt Locks who agreed to see the patient for admission.  Spoke to Hilbert Odor PA-C with orthopedics will see the patient for consult.  Patient was  discussed with and evaluated by Dr. Reather Converse.   Final Clinical Impression(s) / ED Diagnoses Final diagnoses:  Cellulitis    Rx / DC Orders ED Discharge Orders    None       Loni Beckwith, PA-C 07/24/20 1658    Elnora Morrison, MD 07/26/20 1507

## 2020-07-24 NOTE — ED Triage Notes (Signed)
Pt states she is here for a wound on her L foot and not feeling well x 1 week.  Per chart pt was sent by Dr.  Lequita Halt by Dr. Chalmers Cater today.  Denies fever and chills.

## 2020-07-25 ENCOUNTER — Other Ambulatory Visit: Payer: Self-pay

## 2020-07-25 ENCOUNTER — Encounter (HOSPITAL_COMMUNITY): Payer: Self-pay | Admitting: Internal Medicine

## 2020-07-25 DIAGNOSIS — E119 Type 2 diabetes mellitus without complications: Secondary | ICD-10-CM

## 2020-07-25 DIAGNOSIS — L03116 Cellulitis of left lower limb: Secondary | ICD-10-CM

## 2020-07-25 DIAGNOSIS — Z794 Long term (current) use of insulin: Secondary | ICD-10-CM

## 2020-07-25 DIAGNOSIS — G9341 Metabolic encephalopathy: Secondary | ICD-10-CM | POA: Diagnosis not present

## 2020-07-25 LAB — BASIC METABOLIC PANEL
Anion gap: 10 (ref 5–15)
BUN: 28 mg/dL — ABNORMAL HIGH (ref 8–23)
CO2: 26 mmol/L (ref 22–32)
Calcium: 9.2 mg/dL (ref 8.9–10.3)
Chloride: 103 mmol/L (ref 98–111)
Creatinine, Ser: 1.74 mg/dL — ABNORMAL HIGH (ref 0.44–1.00)
GFR, Estimated: 33 mL/min — ABNORMAL LOW (ref >60)
Glucose, Bld: 221 mg/dL — ABNORMAL HIGH (ref 70–99)
Potassium: 4.2 mmol/L (ref 3.5–5.1)
Sodium: 139 mmol/L (ref 135–145)

## 2020-07-25 LAB — CBC
HCT: 29.7 % — ABNORMAL LOW (ref 36.0–46.0)
Hemoglobin: 10 g/dL — ABNORMAL LOW (ref 12.0–15.0)
MCH: 32.3 pg (ref 26.0–34.0)
MCHC: 33.7 g/dL (ref 30.0–36.0)
MCV: 95.8 fL (ref 80.0–100.0)
Platelets: 310 10*3/uL (ref 150–400)
RBC: 3.1 MIL/uL — ABNORMAL LOW (ref 3.87–5.11)
RDW: 13.9 % (ref 11.5–15.5)
WBC: 6.5 10*3/uL (ref 4.0–10.5)
nRBC: 0 % (ref 0.0–0.2)

## 2020-07-25 LAB — GLUCOSE, CAPILLARY
Glucose-Capillary: 225 mg/dL — ABNORMAL HIGH (ref 70–99)
Glucose-Capillary: 111 mg/dL — ABNORMAL HIGH (ref 70–99)
Glucose-Capillary: 203 mg/dL — ABNORMAL HIGH (ref 70–99)

## 2020-07-25 MED ORDER — TRIAMCINOLONE 0.1 % CREAM:EUCERIN CREAM 1:1
TOPICAL_CREAM | Freq: Two times a day (BID) | CUTANEOUS | Status: DC
  Filled 2020-07-25: qty 1

## 2020-07-25 MED ORDER — BACID PO TABS: 2.0000 | ORAL_TABLET | Freq: Three times a day (TID) | ORAL | 0 refills | Status: AC

## 2020-07-25 MED ORDER — CEFDINIR 300 MG PO CAPS: 300.0000 mg | ORAL_CAPSULE | Freq: Every day | ORAL | 0 refills | Status: AC

## 2020-07-25 NOTE — Discharge Summary (Signed)
Discharge Summary  Dana [name] FKC:[id number] DOB: [birth year]  PCP: Dana Morning, DO  Admit date: 07/24/2020 Discharge date: 07/25/2020  Time spent: 21mins  Recommendations for Outpatient Follow-up:  1. F/u with PCP within a week  for hospital discharge follow up, repeat cbc/bmp at follow up 2. F/u with Dana [name] in one week 3. Patient is to transition to ALF Dana [name] on Friday)   please follow up on final urine culture result  ( urine culture from 3/15)   Discharge Diagnoses:  Active Hospital Problems   Diagnosis Date Noted  . Cellulitis 07/24/2020  . Cellulitis of left foot 07/21/2020    Resolved Hospital Problems  No resolved problems to display.    Discharge Condition: stable  Diet recommendation: heart healthy/carb modified  There were no vitals filed for this visit.  History of present illness: ( from admitting MD Dana [name]) Chief Complaint: AMS  HPI: Dana [name] is a 65 y.o. female with medical history significant of IDDM with insulin resistance, diabetic neuropathy, status post right BKA, CKD stage III, status post left first toe and metatarsal amputation, anxiety/depression, presented with altered mentation.  Patient lives with her sister, who is presented at bedside.  Sister took patient to her regular endocrinology visit today, and was sent by endocrinology to ED for lowering about worsening of the left foot cellulitis/infection.  3 days ago, patient was hospitalized for same foot cellulitis, x-ray was negative for deep tissue infection, and patient was evaluated by orthopedic surgery and cleared for discharge.  Patient during that admission received 1 day of IV clindamycin and switch to p.o. clindamycin 300 every 8 hours.  Sister however concerned about the patient has not been compliant with her antibiotics regimen, and sister also suspect patient has not been compliant with her diet and insulin regimen as her glucose today is 387.  Since yesterday  evening, patient has become lethargic and sleepy.  Denies any fever chills, she has chronic neuropathy, Charcot foot and does not feel any pain of the left foot. ED Course: WBC 6.1, no left shift, foot x-ray pending, glucose 381, creatinine 2.0.   Hospital Course:  Active Problems:   Cellulitis of left foot   Cellulitis   Acute metabolic encephalopathy Ct head no acute findings, uds negative Possible related to infection -resolved  Left foot cellulitis? Vs UTI She improved on iv clindamycin, she is to continue oral clindamycin Blood culture no growth Urine culture +klebsiella penumoniae, sensitivity result pending, she is discharged on omnicef daily (renal dosing) for three days Ortho Dana [name] input appreciated, patient is discharged on compression stocking and post op shoe and follow up with Dana [name] in one week  IDDM with hyperglycemia -Suspect noncompliant with insulin and diet, sister reported patient drinks 1 L of regular Dana [name] everyday -she is in the process to go to ALF to get more supervision in diet and medication management, - son reports patient will go there on Friday  -continue home insulin regiemen  CKD stage III -Euvolemic, creatinine level stable, continue Lasix  Psych: continue home meds, she is pleasant and cooperative   Code Status: Full Code Family Communication: son over the phone   Procedures:  none  Consultations:  Ortho   Discharge Exam: BP 129/72 (BP Location: Left Arm)   Pulse 90   Temp 98.3 F (36.8 C) (Oral)   Resp 20   SpO2 100%   General: NAD, pleasant  Cardiovascular: RRR Respiratory: CTABL S/p right bka with prosthesis ,  left foot post op changes, mild erythema  Discharge Instructions You were cared for by a hospitalist during your hospital stay. If you have any questions about your discharge medications or the care you received while you were in the hospital after you are discharged, you can call the unit and asked to  speak with the hospitalist on call if the hospitalist that took care of you is not available. Once you are discharged, your primary care physician will handle any further medical issues. Please note that NO REFILLS for any discharge medications will be authorized once you are discharged, as it is imperative that you return to your primary care physician (or establish a relationship with a primary care physician if you do not have one) for your aftercare needs so that they can reassess your need for medications and monitor your lab values.  Discharge Instructions    Diet - low sodium heart healthy   Complete by: As directed    Carb modified diet   Increase activity slowly   Complete by: As directed      Allergies as of 07/25/2020      Reactions   Ativan [lorazepam] Anaphylaxis      Codeine Itching, Other (See Comments)   hallucinations   Demerol [meperidine] Anaphylaxis   Keflex [cephalexin] Shortness Of Breath, Rash   Tremors   Lithium Nausea And Vomiting, Other (See Comments)   Can not keep this medication down. It makes her terribly ill.   Doxycycline Other (See Comments)   Severe muscle tremor   Oxycodone Other (See Comments)   Abnormal behavior   Darvocet [propoxyphene N-acetaminophen] Itching   Latex Itching   Propoxyphene Itching      Medication List    STOP taking these medications   pantoprazole 40 MG tablet Commonly known as: PROTONIX     TAKE these medications   aspirin 81 MG EC tablet Take 1 tablet (81 mg total) by mouth daily. Swallow whole.   atorvastatin 40 MG tablet Commonly known as: LIPITOR Take 40 mg by mouth daily.   BD Veo Insulin Syringe U/F 31G X 15/64" 1 ML Misc Generic drug: Insulin Syringe-Needle U-100 Administer insulin up 3 times per day per sliding scale E.11.9   benztropine 1 MG tablet Commonly known as: COGENTIN Take 0.5-1 mg by mouth See admin instructions. Takes 1 mg in the night and 0.5 mg in the [name]   busPIRone 10 MG  tablet Commonly known as: BUSPAR Take 20 mg by mouth 3 (three) times daily.   cefdinir 300 MG capsule Commonly known as: OMNICEF Take 1 capsule (300 mg total) by mouth daily for 3 days.   clindamycin 300 MG capsule Commonly known as: CLEOCIN Take 1 capsule (300 mg total) by mouth 3 (three) times daily for 10 days.   DULoxetine 60 MG capsule Commonly known as: CYMBALTA Take 60 mg by mouth 2 (two) times daily.   famotidine 20 MG tablet Commonly known as: PEPCID Take 20 mg by mouth at bedtime.   fluticasone 50 MCG/ACT nasal spray Commonly known as: FLONASE Place 1 spray into both nostrils daily as needed for allergies.   freestyle lancets Use as instructed to check blood sugar 3 times per day dx code E11.39   FreeStyle Libre 14 Day Sensor Misc SMARTSIG:1 Each Topical Every 2 Weeks   furosemide 40 MG tablet Commonly known as: LASIX Take 1 tablet (40 mg total) by mouth 2 (two) times daily.   gabapentin 600 MG tablet Commonly known as: NEURONTIN  Take 600 mg by mouth 2 (two) times daily.   glucose blood test strip Commonly known as: FREESTYLE LITE USE AS INSTRUCTED TO CHECK BLOOD SUGAR 3 TIMES DAILY. DX:E11.65   Iron 325 (65 Fe) MG Tabs Take 325 mg by mouth daily.   lactobacillus acidophilus Tabs tablet Take 2 tablets by mouth 3 (three) times daily for 10 days.   LamoTRIgine 200 MG Tb24 24 hour tablet Take 200 mg by mouth daily.   metFORMIN 500 MG 24 hr tablet Commonly known as: GLUCOPHAGE-XR TAKE 2 TABLETS IN THE [name] AND 2 TABLETS AT NIGHT. What changed:   how much to take  how to take this  when to take this  additional instructions   NovoLIN N ReliOn 100 UNIT/ML injection Generic drug: insulin NPH Human INJECT 60 UNITS AM and 50 units PM SUBCUTANEOUSLY TWICE DAILY FOR 30 DAYS What changed:   how much to take  how to take this  when to take this  additional instructions   NovoLOG FlexPen ReliOn 100 UNIT/ML FlexPen Generic drug: insulin  aspart Inject 50 Units into the skin 3 (three) times daily.   PREVAGEN PO Take 1 capsule by mouth daily.   QUEtiapine 200 MG tablet Commonly known as: SEROQUEL Take 200 mg by mouth at bedtime.   Skin Tac Adhesive Barrier Wipe Misc 1 each by Does not apply route daily.   vitamin B-12 1000 MCG tablet Commonly known as: CYANOCOBALAMIN Take 1 tablet (1,000 mcg total) by mouth daily.      Allergies  Allergen Reactions  . Ativan [Lorazepam] Anaphylaxis       . Codeine Itching and Other (See Comments)    hallucinations  . Demerol [Meperidine] Anaphylaxis  . Keflex [Cephalexin] Shortness Of Breath and Rash    Tremors  . Lithium Nausea And Vomiting and Other (See Comments)    Can not keep this medication down. It makes her terribly ill.  . Doxycycline Other (See Comments)    Severe muscle tremor  . Oxycodone Other (See Comments)    Abnormal behavior  . Darvocet [Propoxyphene N-Acetaminophen] Itching  . Latex Itching  . Propoxyphene Itching    Follow-up Information    Dana Morning, DO Follow up in 1 week(s).   Specialty: Family Medicine Why: hosptial discharge follow up Contact information: [address] [place] [address] [phone]        Newt Minion, MD Follow up in 1 week(s).   Specialty: Orthopedic Surgery Why: post op follow up Contact information: [place] [address] [phone]                The results of significant diagnostics from this hospitalization (including imaging, microbiology, ancillary and laboratory) are listed below for reference.    Significant Diagnostic Studies: CT Head Wo Contrast  Result Date: 07/20/2020 CLINICAL DATA:  Encephalopathy EXAM: CT HEAD WITHOUT CONTRAST TECHNIQUE: Contiguous axial images were obtained from the base of the skull through the vertex without intravenous contrast. COMPARISON:  05/20/2016 FINDINGS: Brain: There is no mass, hemorrhage or extra-axial collection.  The size and configuration of the ventricles and extra-axial CSF spaces are normal. The brain parenchyma is normal, without acute or chronic infarction. Vascular: No abnormal hyperdensity of the major intracranial arteries or dural venous sinuses. No intracranial atherosclerosis. Skull: The visualized skull base, calvarium and extracranial soft tissues are normal. Sinuses/Orbits: No fluid levels or advanced mucosal thickening of the visualized paranasal sinuses. No mastoid or middle ear effusion. The orbits are  normal. IMPRESSION: Normal head CT. Electronically Signed   By: Ulyses Jarred M.D.   On: 07/20/2020 22:39   DG Foot 2 Views Left  Result Date: 07/24/2020 CLINICAL DATA:  Left foot wound. EXAM: LEFT FOOT - 2 VIEW COMPARISON:  July 20, 2020. FINDINGS: Status post surgical resection of the first ray. 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 Moderate posterior calcaneal spurring is noted. No acute fracture is noted. No definite lytic destruction is seen to suggest osteomyelitis. Plantar soft tissue swelling is noted. No radiopaque foreign body is noted. IMPRESSION: Status post surgical resection of the first ray. 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 No definite lytic destruction is seen to suggest osteomyelitis. Electronically Signed   By: Marijo Conception M.D.   On: 07/24/2020 14:51   DG Foot Complete Left  Result Date: 07/20/2020 CLINICAL DATA:  Diabetic foot infection.  Purulence with black toes EXAM: LEFT FOOT - COMPLETE 3+ VIEW COMPARISON:  Radiograph 04/02/2020 FINDINGS: Prior resection of the first ray. Chronic dislocation of the second toe at the metatarsal phalangeal joint. There is bony ankylosis of the tarsal bones and tarsal metatarsal articulation. Disordered alignment of the tarsal bones. Partial ankylosis of the proximal metatarsals. Chronic deformity of the third toe at the metatarsal phalangeal joint may reflect prior injury.  Chronic periosteal thickening of the metatarsals most prominently involving the second. No frank bony destruction. Large plantar calcaneal spur with moderate Achilles tendon enthesophyte. There is generalized soft tissue edema throughout the foot. Tiny 4 mm density in the plantar soft tissues was not seen on prior exam may represent foreign body or calcification. No tracking soft tissue air. IMPRESSION: 1. Generalized soft tissue edema. No frank bony destruction. 2. Tiny 4 mm density in the plantar soft tissues may represent foreign body or calcification. 3. Prior resection of the first ray. Multifocal joint ankylosis consistent with Charcot foot. Chronic dislocation of the second toe. 4. Chronic periosteal thickening of metatarsals most prominently involving the second which is unchanged from prior exam. Electronically Signed   By: Keith Rake M.D.   On: 07/20/2020 22:10    Microbiology: Recent Results (from the past 240 hour(s))  SARS CORONAVIRUS 2 (TAT 6-24 HRS) Nasopharyngeal Nasopharyngeal Swab     Status: None   Collection Time: 07/21/20  2:15 AM   Specimen: Nasopharyngeal Swab  Result Value Ref Range Status   SARS Coronavirus 2 NEGATIVE NEGATIVE Final    Comment: (NOTE) SARS-CoV-2 target nucleic acids are NOT DETECTED.  The SARS-CoV-2 RNA is generally detectable in upper and lower respiratory specimens during the acute phase of infection. Negative results do not preclude SARS-CoV-2 infection, do not rule out co-infections with other pathogens, and should not be used as the sole basis for treatment or other patient management decisions. Negative results must be combined with clinical observations, patient history, and epidemiological information. The expected result is Negative.  Fact Sheet for Patients: SugarRoll.be  Fact Sheet for Healthcare Providers: https://www.woods-mathews.com/  This test is not yet approved or cleared by the  [place] FDA and  has been authorized for detection and/or diagnosis of SARS-CoV-2 by FDA under an Emergency Use Authorization (EUA). This EUA will remain  in effect (meaning this test can be used) for the duration of the COVID-19 declaration under Se ction 564(b)(1) of the Act, 21 U.S.C. section 360bbb-3(b)(1), unless the authorization is terminated or revoked sooner.  Performed at [facility], [place] [address], [place], [place] [address]  Blood culture (routine x 2)     Status: None (Preliminary result)   Collection Time: 07/24/20  1:26 PM   Specimen: BLOOD  Result Value Ref Range Status   Specimen Description BLOOD LEFT ANTECUBITAL  Final   Special Requests   Final    BOTTLES DRAWN AEROBIC AND ANAEROBIC Blood Culture adequate volume   Culture   Final    NO GROWTH < 24 HOURS Performed at [facility], [place] [address], [place], [place] [address]    Report Status PENDING  Incomplete  Blood culture (routine x 2)     Status: None (Preliminary result)   Collection Time: 07/24/20  1:31 PM   Specimen: BLOOD  Result Value Ref Range Status   Specimen Description BLOOD BLOOD LEFT FOREARM  Final   Special Requests   Final    BOTTLES DRAWN AEROBIC AND ANAEROBIC Blood Culture adequate volume   Culture   Final    NO GROWTH < 24 HOURS Performed at [facility], [place] [address], [place], [place] [address]    Report Status PENDING  Incomplete  Urine culture     Status: Abnormal (Preliminary result)   Collection Time: 07/24/20  1:39 PM   Specimen: Urine, Random  Result Value Ref Range Status   Specimen Description URINE, RANDOM  Final   Special Requests Immunocompromised  Final   Culture (A)  Final    >=100,000 COLONIES/mL KLEBSIELLA PNEUMONIAE SUSCEPTIBILITIES TO FOLLOW Performed at [facility], [place] [address], [place], [place] [address]    Report Status PENDING  Incomplete  SARS CORONAVIRUS 2 (TAT 6-24 HRS) Nasopharyngeal Nasopharyngeal Swab     Status:  None   Collection Time: 07/24/20  3:06 PM   Specimen: Nasopharyngeal Swab  Result Value Ref Range Status   SARS Coronavirus 2 NEGATIVE NEGATIVE Final    Comment: (NOTE) SARS-CoV-2 target nucleic acids are NOT DETECTED.  The SARS-CoV-2 RNA is generally detectable in upper and lower respiratory specimens during the acute phase of infection. Negative results do not preclude SARS-CoV-2 infection, do not rule out co-infections with other pathogens, and should not be used as the sole basis for treatment or other patient management decisions. Negative results must be combined with clinical observations, patient history, and epidemiological information. The expected result is Negative.  Fact Sheet for Patients: SugarRoll.be  Fact Sheet for Healthcare Providers: https://www.woods-mathews.com/  This test is not yet approved or cleared by the [place] FDA and  has been authorized for detection and/or diagnosis of SARS-CoV-2 by FDA under an Emergency Use Authorization (EUA). This EUA will remain  in effect (meaning this test can be used) for the duration of the COVID-19 declaration under Se ction 564(b)(1) of the Act, 21 U.S.C. section 360bbb-3(b)(1), unless the authorization is terminated or revoked sooner.  Performed at [facility], [place] [address], [place], [place] [address]      Labs: Basic Metabolic Panel: Recent Labs  Lab 07/20/20 2002 07/21/20 0206 07/24/20 1235 07/24/20 1516 07/25/20 0145  NA 137 137 134* 138 139  K 4.3 4.2 4.3 4.4 4.2  CL 101 101 98  --  103  CO2 25 25 27   --  26  GLUCOSE 63* 207* 381*  --  221*  BUN 33* 27* 32*  --  28*  CREATININE 2.02* 1.87* 2.00*  --  1.74*  CALCIUM 9.4 9.3 9.1  --  9.2   Liver Function Tests: Recent Labs  Lab 07/20/20 2002  AST 31  ALT 31  ALKPHOS 91  BILITOT 0.7  PROT 7.2  ALBUMIN 3.8   No results for input(s): LIPASE, AMYLASE in the last 168 hours. Recent Labs   Lab 07/24/20 1355  AMMONIA 30   CBC: Recent Labs  Lab 07/20/20 2002 07/21/20 0206 07/24/20 1235 07/24/20 1516 07/25/20 0145  WBC 7.1 5.6 6.1  --  6.5  HGB 10.4* 10.3* 9.9* 10.2* 10.0*  HCT 31.2* 30.9* 29.4* 30.0* 29.7*  MCV 97.8 96.9 97.0  --  95.8  PLT 357 347 312  --  310   Cardiac Enzymes: No results for input(s): CKTOTAL, CKMB, CKMBINDEX, TROPONINI in the last 168 hours. BNP: BNP (last 3 results) No results for input(s): BNP in the last 8760 hours.  ProBNP (last 3 results) No results for input(s): PROBNP in the last 8760 hours.  CBG: Recent Labs  Lab 07/21/20 1128 07/24/20 1150 07/24/20 1816 07/25/20 0808 07/25/20 1212  GLUCAP 215* 361* 311* 225* 111*       Signed:  Florencia Reasons MD, PhD, FACP  [facility] 07/25/2020, 3:58 PM

## 2020-07-25 NOTE — TOC Transition Note (Signed)
Transition of Care [facility]) - CM/SW Discharge Note   Patient Details  Name: Dana [name] MRN: [id number] Date of Birth: 1955/10/09  Transition of Care [facility]) CM/SW Contact:  [name], [place] Phone Number: 07/25/2020, 3:36 PM   Clinical Narrative:    CSW spoke to pt who instructed CSW to call her son [name] at [phone]. [name] stated that pt was supposed move into [facility] on [address] in [place] last week but was unable to, but will be able to DC there today. [name] gave CSW contact information for admission coordinator [name] at [phone]. [name] stated that pt still has steps to complete before moving in (a TB test, and an evaluation from their nurse).   CSW called back [name], [name] stated that pt [facility] [place]. CSW asked if RN can read the test here in the hospital and record the results. [name] stated tat pt will return home until she is able to move into facility.   CSW will complete FL2 and fax it to [facility] at [phone].    Final next level of care: Home/Self Care Barriers to Discharge: Barriers Resolved   Patient Goals and CMS Choice Patient states their goals for this hospitalization and ongoing recovery are:: Move into [facility].gov Compare Post Acute Care list provided to:: Patient Choice offered to / list presented to : Patient  Discharge Placement                  Name of family member notified: Son [name] Patient and family notified of of transfer: 07/25/20  Discharge Plan and Services                                     Social Determinants of Health (SDOH) Interventions     Readmission Risk Interventions No flowsheet data found.

## 2020-07-25 NOTE — Progress Notes (Signed)
Orthopedic Tech Progress Note Patient Details:  Dana [name] [birth year] [id number] Called in order to [facility] for a GRADUATED COMPRESSION STOCKING Patient ID: Dana [name], female   DOB: [birth year], 65 y.o.   MRN: [id number]   Janit Pagan 07/25/2020, 9:01 AM

## 2020-07-25 NOTE — NC FL2 (Addendum)
[place] LEVEL OF CARE SCREENING TOOL     IDENTIFICATION  Patient Name: Dana [name] Birthdate: 23-Jul-1955 Sex: female Admission Date (Current Location): 07/24/2020  [facility] and [place] Number:  Herbalist and Address:  [facility] [place], [place] [address], [place], [place] [address]      Provider Number: [id number]  Attending Physician Name and Address:  Florencia Reasons, MD  Relative Name and Phone Number:       Current Level of Care: Hospital Recommended Level of Care: [place] Prior Approval Number:    Date Approved/Denied:   PASRR Number:    Discharge Plan: Other (Comment) ([place])    Current Diagnoses: Patient Active Problem List   Diagnosis Date Noted  . Cellulitis 07/24/2020  . Cellulitis of left foot 07/21/2020  . DM (diabetes mellitus), type 2 with renal complications ([place]) 62/13/0865  . CKD (chronic kidney disease) stage 4, GFR 15-29 ml/min (HCC) 07/21/2020  . Hx of right BKA ([place]) 07/21/2020  . Cellulitis of left lower extremity   . Low vitamin B12 level 04/04/2020  . E. coli UTI 04/04/2020  . AKI (acute kidney injury) ([place])   . Generalized abdominal pain   . Benign fundic gland polyps of stomach   . AMS (altered mental status) 04/02/2020  . Severe sepsis ([place]) 09/27/2019  . Acute respiratory failure with hypoxia ([place]) 09/27/2019  . Emesis 09/27/2019  . Hyperglycemia 09/27/2019  . Sepsis ([place]) 09/27/2019  . Encephalopathy, metabolic   . Coffee ground emesis   . Hyperkalemia   . Pneumonia 09/26/2019  . Partial nontraumatic amputation of left foot ([place]) 04/28/2017  . Acquired absence of right leg below knee ([place]) 04/14/2017  . Pleural effusion 01/19/2017  . Contracture of left Achilles tendon 01/16/2017  . Allergic rhinitis 01/03/2017  . Gastroesophageal reflux disease without esophagitis 01/03/2017  . Hiatal hernia 02/03/2016  . Anemia associated with diabetes mellitus ([place])  11/07/2015  . Iron malabsorption 09/26/2015  . Other iron deficiency anemia 09/20/2015  . 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 Diabetic retinopathy associated with diabetes mellitus due to underlying condition ([place]) 03/15/2015  . DM (diabetes mellitus) type II uncontrolled with eye manifestation ([place]) 11/08/2014  . Hyperlipidemia LDL goal <100 11/08/2014  . Diabetic polyneuropathy associated with type 2 diabetes mellitus ([place]) 11/08/2014  . Partial nontraumatic amputation of foot ([place]) 11/08/2014  . Type 2 diabetes mellitus without complication, with long-term current use of insulin ([place]) 10/24/2011  . Bipolar disorder ([place]) 10/24/2011    Orientation RESPIRATION BLADDER Height & Weight     Self,Time,Situation,Place  Normal Continent Weight:   Height:     BEHAVIORAL SYMPTOMS/MOOD NEUROLOGICAL BOWEL NUTRITION STATUS      Continent Diet (See discharge summary)  AMBULATORY STATUS COMMUNICATION OF NEEDS Skin   Limited Assist Verbally Normal                       Personal Care Assistance Level of Assistance  Bathing,Feeding,Dressing Bathing Assistance: Limited assistance Feeding assistance: Independent Dressing Assistance: Limited assistance     Functional Limitations Info  Sight,Hearing,Speech Sight Info: Adequate Hearing Info: Adequate Speech Info: Adequate    SPECIAL CARE FACTORS FREQUENCY                       Contractures Contractures Info: Not present    Additional Factors Info  Code Status,Allergies Code Status Info: Full Allergies Info: Ativan (Lorazepam)   Codeine  Demerol (Meperidine)   Keflex (Cephalexin)   Lithium   Doxycycline   Oxycodone   Darvocet (Propoxyphene N-acetaminophen)   Latex   Propoxyphene           Current Medications (07/25/2020):  This is the current hospital active medication list Current Facility-Administered Medications  Medication Dose Route Frequency Provider Last Rate Last Admin  . aspirin EC tablet 81 mg  81 mg  Oral Daily Wynetta Fines T, MD   81 mg at 07/25/20 1100  . atorvastatin (LIPITOR) tablet 40 mg  40 mg Oral Daily Wynetta Fines T, MD   40 mg at 07/25/20 1100  . benztropine (COGENTIN) tablet 1 mg  1 mg Oral QHS Wynetta Fines T, MD   1 mg at 07/24/20 2255   And  . benztropine (COGENTIN) tablet 0.5 mg  0.5 mg Oral Daily Wynetta Fines T, MD   0.5 mg at 07/25/20 1100  . busPIRone (BUSPAR) tablet 20 mg  20 mg Oral TID Wynetta Fines T, MD   20 mg at 07/25/20 1100  . clindamycin (CLEOCIN) IVPB 600 mg  600 mg Intravenous Q8H Wynetta Fines T, MD 100 mL/hr at 07/25/20 1450 600 mg at 07/25/20 1450  . DULoxetine (CYMBALTA) DR capsule 60 mg  60 mg Oral BID Wynetta Fines T, MD   60 mg at 07/25/20 1100  . ferrous sulfate tablet 325 mg  325 mg Oral Daily Wynetta Fines T, MD   325 mg at 07/25/20 1100  . fluticasone (FLONASE) 50 MCG/ACT nasal spray 1 spray  1 spray Each Nare Daily Wynetta Fines T, MD   1 spray at 07/25/20 1100  . furosemide (LASIX) tablet 40 mg  40 mg Oral BID Lequita Halt, MD   40 mg at 07/25/20 0925  . gabapentin (NEURONTIN) tablet 300 mg  300 mg Oral BID Wynetta Fines T, MD   300 mg at 07/25/20 1100  . heparin injection 5,000 Units  5,000 Units Subcutaneous Q8H Lequita Halt, MD   5,000 Units at 07/25/20 [phone]  . insulin aspart (novoLOG) injection 0-5 Units  0-5 Units Subcutaneous QHS Wynetta Fines T, MD      . insulin aspart (novoLOG) injection 0-9 Units  0-9 Units Subcutaneous TID WC Lequita Halt, MD   3 Units at 07/25/20 0900  . insulin aspart (novoLOG) injection 50 Units  50 Units Subcutaneous TID with meals Lequita Halt, MD   50 Units at 07/25/20 0900  . insulin glargine (LANTUS) injection 10 Units  10 Units Subcutaneous Daily Lequita Halt, MD   10 Units at 07/25/20 1100  . lactobacillus acidophilus (BACID) tablet 2 tablet  2 tablet Oral TID Lequita Halt, MD   2 tablet at 07/25/20 1100  . lamoTRIgine (LAMICTAL) tablet 100 mg  100 mg Oral BID Wynetta Fines T, MD   100 mg at 07/25/20 1100  . linagliptin  (TRADJENTA) tablet 5 mg  5 mg Oral Daily Wynetta Fines T, MD   5 mg at 07/25/20 1100  . pantoprazole (PROTONIX) EC tablet 40 mg  40 mg Oral Daily Wynetta Fines T, MD   40 mg at 07/25/20 1100  . QUEtiapine (SEROQUEL) tablet 200 mg  200 mg Oral QHS Wynetta Fines T, MD   200 mg at 07/24/20 2220  . triamcinolone 0.1 % cream : eucerin cream, 1:1   Topical BID Florencia Reasons, MD      . vitamin B-12 (CYANOCOBALAMIN) tablet 1,000 mcg  1,000 mcg Oral Daily Lequita Halt, MD  1,000 mcg at 07/25/20 1100     Discharge Medications: pantoprazole 40 MG tablet Commonly known as: PROTONIX     TAKE these medications   aspirin 81 MG EC tablet Take 1 tablet (81 mg total) by mouth daily. Swallow whole.   atorvastatin 40 MG tablet Commonly known as: LIPITOR Take 40 mg by mouth daily.   BD Veo Insulin Syringe U/F 31G X 15/64" 1 ML Misc Generic drug: Insulin Syringe-Needle U-100 Administer insulin up 3 times per day per sliding scale E.11.9   benztropine 1 MG tablet Commonly known as: COGENTIN Take 0.5-1 mg by mouth See admin instructions. Takes 1 mg in the night and 0.5 mg in the morning   busPIRone 10 MG tablet Commonly known as: BUSPAR Take 20 mg by mouth 3 (three) times daily.   cefdinir 300 MG capsule Commonly known as: OMNICEF Take 1 capsule (300 mg total) by mouth daily for 3 days.   clindamycin 300 MG capsule Commonly known as: CLEOCIN Take 1 capsule (300 mg total) by mouth 3 (three) times daily for 10 days.   DULoxetine 60 MG capsule Commonly known as: CYMBALTA Take 60 mg by mouth 2 (two) times daily.   famotidine 20 MG tablet Commonly known as: PEPCID Take 20 mg by mouth at bedtime.   fluticasone 50 MCG/ACT nasal spray Commonly known as: FLONASE Place 1 spray into both nostrils daily as needed for allergies.   freestyle lancets Use as instructed to check blood sugar 3 times per day dx code E11.39   FreeStyle Libre 14 Day Sensor Misc SMARTSIG:1 Each Topical Every 2  Weeks   furosemide 40 MG tablet Commonly known as: LASIX Take 1 tablet (40 mg total) by mouth 2 (two) times daily.   gabapentin 600 MG tablet Commonly known as: NEURONTIN Take 600 mg by mouth 2 (two) times daily.   glucose blood test strip Commonly known as: FREESTYLE LITE USE AS INSTRUCTED TO CHECK BLOOD SUGAR 3 TIMES DAILY. DX:E11.65   Iron 325 (65 Fe) MG Tabs Take 325 mg by mouth daily.   lactobacillus acidophilus Tabs tablet Take 2 tablets by mouth 3 (three) times daily for 10 days.   LamoTRIgine 200 MG Tb24 24 hour tablet Take 200 mg by mouth daily.   metFORMIN 500 MG 24 hr tablet Commonly known as: GLUCOPHAGE-XR TAKE 2 TABLETS IN THE MORNING AND 2 TABLETS AT NIGHT. What changed:   how much to take  how to take this  when to take this  additional instructions   NovoLIN N ReliOn 100 UNIT/ML injection Generic drug: insulin NPH Human INJECT 60 UNITS AM and 50 units PM SUBCUTANEOUSLY TWICE DAILY FOR 30 DAYS What changed:   how much to take  how to take this  when to take this  additional instructions   NovoLOG FlexPen ReliOn 100 UNIT/ML FlexPen Generic drug: insulin aspart Inject 50 Units into the skin 3 (three) times daily.   PREVAGEN PO Take 1 capsule by mouth daily.   QUEtiapine 200 MG tablet Commonly known as: SEROQUEL Take 200 mg by mouth at bedtime.   Skin Tac Adhesive Barrier Wipe Misc 1 each by Does not apply route daily.   vitamin B-12 1000 MCG tablet Commonly known as: CYANOCOBALAMIN Take 1 tablet (1,000 mcg total) by mouth daily.      Relevant Imaging Results:  Relevant Lab Results:   Additional Information SSN: [id number]  [name], [place]

## 2020-07-25 NOTE — Consult Note (Signed)
ORTHOPAEDIC CONSULTATION  REQUESTING PHYSICIAN: Florencia Reasons, MD  Chief Complaint: Cellulitis left leg.  HPI: Dana [name] is a 65 y.o. female who presents with cellulitis left leg with ulcerations to the left foot status post first ray amputation.  Past Medical History:  Diagnosis Date  . Bipolar 1 disorder ([place])   . Cellulitis and abscess of foot 03/08/2015  . Diabetes mellitus    INSULIN DEPENDENT  . Diabetic neuropathy ([place])   . 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 H/O hiatal hernia   . Hyperlipidemia   . Hypertension    past hx of  . Iron malabsorption 09/26/2015  . Numbness and tingling    Hx; of in B/LLE and B/LUE  . Other iron deficiency anemias 09/20/2015   Past Surgical History:  Procedure Laterality Date  . ABDOMINAL HYSTERECTOMY    . ADENOIDECTOMY     Hx: of  . AMPUTATION  10/24/2011   Procedure: AMPUTATION RAY;  Surgeon: Newt Minion, MD;  Location: [facility];  Service: Orthopedics;  Laterality: Right;  Right Foot 3rd Ray Amputation  . AMPUTATION Right 12/16/2012   Procedure: Right Foot Transmetatarsal Amputation;  Surgeon: Newt Minion, MD;  Location: [facility];  Service: Orthopedics;  Laterality: Right;  . AMPUTATION Left 03/09/2015   Procedure: LEFT FOOT 1ST RAY AMPUTATION;  Surgeon: Newt Minion, MD;  Location: [facility];  Service: Orthopedics;  Laterality: Left;  . BLADDER SURGERY     x 2, tacked 1st time; mesh "eroded", had to be removed  . Bladder Tact   2002  . BREAST SURGERY Left    "knot removed"  . CARPAL TUNNEL RELEASE Left   . COLON SURGERY    . ESOPHAGOGASTRODUODENOSCOPY (EGD) WITH PROPOFOL N/A 04/03/2020   Procedure: ESOPHAGOGASTRODUODENOSCOPY (EGD) WITH PROPOFOL;  Surgeon: Lavena Bullion, DO;  Location: [facility];  Service: Gastroenterology;  Laterality: N/A;  . EYE SURGERY  11/17/2017  . NASAL SEPTUM SURGERY  1976  . POLYPECTOMY  04/03/2020   Procedure: POLYPECTOMY;  Surgeon: Lavena Bullion, DO;  Location: [facility];  Service:  Gastroenterology;;  . TONSILLECTOMY     age 58's   Social History   Socioeconomic History  . Marital status: Married    Spouse name: Not on file  . Number of children: 2  . Years of education: 39  . Highest education level: Not on file  Occupational History    Comment: disabled  Tobacco Use  . Smoking status: Never Smoker  . Smokeless tobacco: Never Used  Vaping Use  . Vaping Use: Never used  Substance and Sexual Activity  . Alcohol use: No    Alcohol/week: 0.0 standard drinks  . Drug use: No  . Sexual activity: Never    Partners: Male  Other Topics Concern  . Not on file  Social History Narrative   Lives with husband in home   Caffeine use - Coke 2 or 3 16 oz daily   Social Determinants of Health   Financial Resource Strain: Not on file  Food Insecurity: Not on file  Transportation Needs: Not on file  Physical Activity: Not on file  Stress: Not on file  Social Connections: Not on file   Family History  Problem Relation Age of Onset  . Diabetes Mother   . Mental illness Mother   . Bipolar disorder Mother   . Hypertension Father   . Osteoarthritis Father   . Heart disease Father   . Diabetes Sister   . Early death  Brother        MVA  . Healthy Sister   . Diabetes Maternal Grandmother    - negative except otherwise stated in the family history section Allergies  Allergen Reactions  . Ativan [Lorazepam] Anaphylaxis       . Codeine Itching and Other (See Comments)    hallucinations  . Demerol [Meperidine] Anaphylaxis  . Keflex [Cephalexin] Shortness Of Breath and Rash    Tremors  . Lithium Nausea And Vomiting and Other (See Comments)    Can not keep this medication down. It makes her terribly ill.  . Doxycycline Other (See Comments)    Severe muscle tremor  . Oxycodone Other (See Comments)    Abnormal behavior  . Darvocet [Propoxyphene N-Acetaminophen] Itching  . Latex Itching  . Propoxyphene Itching   Prior to Admission medications   Medication  Sig Start Date End Date Taking? Authorizing Provider  Apoaequorin (PREVAGEN PO) Take 1 capsule by mouth daily.   Yes [provider]  aspirin 81 MG EC tablet Take 1 tablet (81 mg total) by mouth daily. Swallow whole. 04/12/20  Yes Eugenie Filler, MD  atorvastatin (LIPITOR) 40 MG tablet Take 40 mg by mouth daily. 06/27/20  Yes [provider]  benztropine (COGENTIN) 1 MG tablet Take 0.5-1 mg by mouth See admin instructions. Takes 1 mg in the night and 0.5 mg in the morning   Yes [provider]  busPIRone (BUSPAR) 10 MG tablet Take 20 mg by mouth 3 (three) times daily.   Yes [provider]  clindamycin (CLEOCIN) 300 MG capsule Take 1 capsule (300 mg total) by mouth 3 (three) times daily for 10 days. 07/21/20 07/31/20 Yes Mercy Riding, MD  DULoxetine (CYMBALTA) 60 MG capsule Take 60 mg by mouth 2 (two) times daily. 06/23/20  Yes [provider]  famotidine (PEPCID) 20 MG tablet Take 20 mg by mouth at bedtime.   Yes [provider]  Ferrous Sulfate (IRON) 325 (65 Fe) MG TABS Take 325 mg by mouth daily.    Yes [provider]  fluticasone (FLONASE) 50 MCG/ACT nasal spray Place 1 spray into both nostrils daily as needed for allergies. 06/26/20  Yes [provider]  furosemide (LASIX) 40 MG tablet Take 1 tablet (40 mg total) by mouth 2 (two) times daily. 07/24/20  Yes Mercy Riding, MD  gabapentin (NEURONTIN) 600 MG tablet Take 600 mg by mouth 2 (two) times daily.    Yes [provider]  LamoTRIgine 200 MG TB24 24 hour tablet Take 200 mg by mouth daily. 06/16/20  Yes [provider]  metFORMIN (GLUCOPHAGE-XR) 500 MG 24 hr tablet TAKE 2 TABLETS IN THE MORNING AND 2 TABLETS AT NIGHT. Patient taking differently: Take 1,000 mg by mouth 2 (two) times daily. 12/09/17  Yes Elayne Snare, MD  NOVOLIN N RELION 100 UNIT/ML injection INJECT 60 UNITS AM and 50 units PM SUBCUTANEOUSLY TWICE DAILY FOR 30 DAYS Patient taking differently:  Inject 50-75 Units into the skin See admin instructions. Take 75 units with breakfast, 75 units with lunch, and 50 units with supper-dose 07/20/18  Yes Harris, Carroll Sage, FNP  NOVOLOG FLEXPEN RELION 100 UNIT/ML FlexPen Inject 50 Units into the skin 3 (three) times daily. 05/04/20  Yes [provider]  pantoprazole (PROTONIX) 40 MG tablet Take 1 tablet (40 mg total) by mouth daily. 07/20/18  Yes Scot Jun, FNP  QUEtiapine (SEROQUEL) 200 MG tablet Take 200 mg by mouth at bedtime. 06/27/20  Yes  [provider]  vitamin B-12 (CYANOCOBALAMIN) 1000 MCG tablet Take 1 tablet (1,000 mcg total) by mouth daily. 04/05/20  Yes Eugenie Filler, MD  BD VEO INSULIN SYRINGE U/F 31G X 15/64" 1 ML MISC Administer insulin up 3 times per day per sliding scale E.11.9 07/20/18   Scot Jun, FNP  Continuous Blood Gluc Sensor (FREESTYLE LIBRE 14 DAY SENSOR) MISC SMARTSIG:1 Each Topical Every 2 Weeks 07/17/19   [provider]  glucose blood (FREESTYLE LITE) test strip USE AS INSTRUCTED TO CHECK BLOOD SUGAR 3 TIMES DAILY. DX:E11.65 07/20/18   Scot Jun, FNP  Lancets (FREESTYLE) lancets Use as instructed to check blood sugar 3 times per day dx code E11.39 07/20/18   Scot Jun, FNP  Ostomy Supplies (SKIN TAC ADHESIVE BARRIER WIPE) MISC 1 each by Does not apply route daily. 11/25/17   Elayne Snare, MD   DG Foot 2 Views Left  Result Date: 07/24/2020 CLINICAL DATA:  Left foot wound. EXAM: LEFT FOOT - 2 VIEW COMPARISON:  July 20, 2020. FINDINGS: Status post surgical resection of the first ray. 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 Moderate posterior calcaneal spurring is noted. No acute fracture is noted. No definite lytic destruction is seen to suggest osteomyelitis. Plantar soft tissue swelling is noted. No radiopaque foreign body is noted. IMPRESSION: Status post surgical resection of the first ray. Chronic dislocation of the second  metatarsophalangeal joint is noted. No definite lytic destruction is seen to suggest osteomyelitis. Electronically Signed   By: Marijo Conception M.D.   On: 07/24/2020 14:51   - pertinent xrays, CT, MRI studies were reviewed and independently interpreted  Positive ROS: All other systems have been reviewed and were otherwise negative with the exception of those mentioned in the HPI and as above.  Physical Exam: General: Alert, no acute distress Psychiatric: Patient is competent for consent with normal mood and affect Lymphatic: No axillary or cervical lymphadenopathy Cardiovascular: No pedal edema Respiratory: No cyanosis, no use of accessory musculature GI: No organomegaly, abdomen is soft and non-tender    Images:  @ENCIMAGES @  Labs:  Lab Results  Component Value Date   HGBA1C 6.4 (H) 07/21/2020   HGBA1C 6.9 (H) 04/02/2020   HGBA1C 7.2 (H) 09/26/2019   ESRSEDRATE 130 (H) 02/07/2016   ESRSEDRATE 120 (H) 10/23/2011   CRP 17.7 (H) 02/07/2016   CRP 21.99 (H) 10/23/2011   REPTSTATUS 04/04/2020 FINAL 04/02/2020   GRAMSTAIN  01/20/2017    CYTOSPIN SMEAR WBC PRESENT, PREDOMINANTLY MONONUCLEAR NO ORGANISMS SEEN    CULT >=100,000 COLONIES/mL ESCHERICHIA COLI (A) 04/02/2020   LABORGA ESCHERICHIA COLI (A) 04/02/2020    Lab Results  Component Value Date   ALBUMIN 3.8 07/20/2020   ALBUMIN 3.2 (L) 04/03/2020   ALBUMIN 3.5 04/02/2020    Neurologic: Patient does not have protective sensation bilateral lower extremities.   MUSCULOSKELETAL:   Skin: Examination patient does have wrinkling the skin but there is some cellulitis dermatitis mid aspect of the left calf.  Examination of the foot the black discoloration from the dye from her shoe is resolving the ulcers are superficial without tunneling without exposed bone or tendon no cellulitis in the foot.  Assessment: Assessment: Cellulitis left leg with venous insufficiency status post ray amputation on the left foot with healing  ulcers.  Plan: I will order a knee-high compression stocking for the patient she is to wear this around-the-clock change when it becomes soiled this should help resolve the dermatitis in her  leg and the swelling in the foot.  Thank you for the consult and the opportunity to see Dana [name]  Shantel Helwig, [place] [phone] 8:20 AM

## 2020-07-25 NOTE — Progress Notes (Signed)
Orthopedic Tech Progress Note Patient Details:  Dana [name] 07/06/1955 [id number]  Ortho Devices Type of Ortho Device: Darco shoe Ortho Device/Splint Location: LLE Ortho Device/Splint Interventions: Ordered,Application,Adjustment   Post Interventions Patient Tolerated: Well Instructions Provided: Care of [place] 07/25/2020, 4:29 PM

## 2020-07-25 NOTE — Progress Notes (Signed)
TB test site read by Probation officer.  Site LAFA and is absent of any redness or induration; indicative of a negative reading.

## 2020-07-25 NOTE — Progress Notes (Signed)
Inpatient Diabetes Program Recommendations  AACE/ADA: New Consensus Statement on Inpatient Glycemic Control (2015)  Target Ranges:  Prepandial:   less than 140 mg/dL      Peak postprandial:   less than 180 mg/dL (1-2 hours)      Critically ill patients:  140 - 180 mg/dL   Lab Results  Component Value Date   GLUCAP 111 (H) 07/25/2020   HGBA1C 6.4 (H) 07/21/2020    Review of Glycemic Control  Diabetes history: DM2 Outpatient Diabetes medications: NPH 75/75/50- Regular insulin 50/50/50 Current orders for Inpatient glycemic control: Lantus 10 units bid + Novolog 50 units tid meal coverage + Novolog 0-9 tid + 0-5 units hs + Tradjenta 5 mg qd  Inpatient Diabetes Program Recommendations:   Consider: -Decrease Novolog meal coverage to 10 units tid if eats 50%  Thank you, Dana Roys E. Aalia Greulich, RN, MSN, CDE  Diabetes Coordinator Inpatient Glycemic Control Team Team Pager [phone] (8am-5pm) 07/25/2020 1:32 PM

## 2020-07-25 NOTE — Consult Note (Signed)
WOC Nurse Consult Note: Patient receiving care in [facility]. Reason for Consult: left foot ulcers The Orthopedics PA note by Silvestre Gunner from 07/24/20 at 3:33 p.m., indicates the patient is to be seen by Dr. Sharol Given. Please direct all questions pertaining to care of the left foot to Orthopedic physician or PA.  The patient was not seen and will not be followed. Val Riles, RN, MSN, CWOCN, CNS-BC, pager [phone]

## 2020-07-25 NOTE — Evaluation (Signed)
Physical Therapy Evaluation Patient Details Name: Dana [name] MRN: [id number] DOB: 01-25-1956 Today's Date: 07/25/2020   History of Present Illness  Pt is a 65 y.o. F admitted 3/15 with left foot cellulitis. Significant PMH: DM2, CKD stage 4, right BKA, left first ray amputation.  Clinical Impression  Pt is up to side of bed when PT arrives, and is stable in sitting and not appearing to be a fall risk. Her L foot shoe is not there, and so did minimal walking with RW to protect her L foot.  Pt is also wearing a loose prosthetic since the socks for filling space are gone with her personal things. Will work on standing balance and endurance when ortho shoe arrives, and will continue to encourage safety awareness and caution with use of R prosthesis until it is refitted with [employer].  Follow for acute PT goals.    Follow Up Recommendations Home health PT;Supervision for mobility/OOB    Equipment Recommendations  None recommended by PT    Recommendations for Other Services       Precautions / Restrictions Precautions Precautions: Fall Precaution Comments: R prosthesis in room Restrictions Weight Bearing Restrictions: No Other Position/Activity Restrictions: clarification of orders, with WBAT in ortho shoe on L foot      Mobility  Bed Mobility Overal bed mobility: Needs Assistance             General bed mobility comments: pt sitting on side of bed when PT arrives    Transfers Overall transfer level: Needs assistance Equipment used: Rolling walker (2 wheeled);1 person hand held assist Transfers: Sit to/from Stand Sit to Stand: Min guard         General transfer comment: min guard with cued hand placement  Ambulation/Gait Ambulation/Gait assistance: Min guard Gait Distance (Feet): 5 Feet Assistive device: Rolling walker (2 wheeled) Gait Pattern/deviations: Step-through pattern;Decreased stride length Gait velocity: controlled Gait velocity interpretation: <1.31  ft/sec, indicative of household ambulator General Gait Details: RLE prosthesis is loose with insufficient socks, L foot in stocking but ortho shoe not delivered  Stairs            Wheelchair Mobility    Modified Rankin (Stroke Patients Only)       Balance Overall balance assessment: Needs assistance Sitting-balance support: Feet supported Sitting balance-Leahy Scale: Good   Postural control: Posterior lean;Right lateral lean Standing balance support: Bilateral upper extremity supported Standing balance-Leahy Scale: Poor Standing balance comment: requires walker to protect L foot                             Pertinent Vitals/Pain Pain Assessment: No/denies pain    Home Living Family/patient expects to be discharged to:: Private residence Living Arrangements: Other relatives;Parent Available Help at Discharge: Family;Available PRN/intermittently Type of Home: House Home Access: Ramped entrance;Stairs to enter     Home Layout: One level Home Equipment: Grab bars - toilet;Grab bars - tub/shower;Shower seat;Walker - 4 wheels;Cane - single point Additional Comments: pt is in an ALF per her report    Prior Function Level of Independence: Needs assistance   Gait / Transfers Assistance Needed: Ambulates with Rollator  ADL's / Homemaking Assistance Needed: requires help to bathe and dress, not getting into a shower  Comments: History of several recent falls     Hand Dominance   Dominant Hand: Right    Extremity/Trunk Assessment   Upper Extremity Assessment Upper Extremity Assessment: Generalized weakness    Lower  Extremity Assessment Lower Extremity Assessment: Generalized weakness RLE Deficits / Details: BKA LLE Deficits / Details: 1st toe amp    Cervical / Trunk Assessment Cervical / Trunk Assessment: Kyphotic  Communication   Communication: No difficulties  Cognition Arousal/Alertness: Awake/alert Behavior During Therapy: WFL for tasks  assessed/performed Overall Cognitive Status: No family/caregiver present to determine baseline cognitive functioning                                 General Comments: pt is not aware of the skin changes on her L foot      General Comments General comments (skin integrity, edema, etc.): Pt is wearing LLE compression sock and has not been delivered her ortho shoe.    Exercises     Assessment/Plan    PT Assessment Patient needs continued PT services  PT Problem List Decreased range of motion;Decreased strength;Decreased activity tolerance;Decreased balance;Decreased mobility;Decreased skin integrity;Decreased coordination;Decreased cognition;Decreased knowledge of use of DME;Decreased safety awareness;Cardiopulmonary status limiting activity;Decreased knowledge of precautions       PT Treatment Interventions DME instruction;Gait training;Functional mobility training;Therapeutic activities;Therapeutic exercise;Balance training;Neuromuscular re-education;Patient/family education    PT Goals (Current goals can be found in the Care Plan section)  Acute Rehab PT Goals Patient Stated Goal: get prosthetic fixed PT Goal Formulation: With patient Time For Goal Achievement: 08/01/20 Potential to Achieve Goals: Good    Frequency Min 3X/week   Barriers to discharge Decreased caregiver support unclear how many staff can assist her    Co-evaluation               AM-PAC PT "6 Clicks" Mobility  Outcome Measure Help needed turning from your back to your side while in a flat bed without using bedrails?: None Help needed moving from lying on your back to sitting on the side of a flat bed without using bedrails?: None Help needed moving to and from a bed to a chair (including a wheelchair)?: A Little Help needed standing up from a chair using your arms (e.g., wheelchair or bedside chair)?: A Little Help needed to walk in hospital room?: A Little Help needed climbing 3-5 steps  with a railing? : A Little 6 Click Score: 20    End of Session Equipment Utilized During Treatment: Gait belt Activity Tolerance: Patient tolerated treatment well Patient left: in bed;with call bell/phone within reach Nurse Communication: Mobility status PT Visit Diagnosis: Unsteadiness on feet (R26.81);Other abnormalities of gait and mobility (R26.89)    Time: 7858-8502 PT Time Calculation (min) (ACUTE ONLY): 16 min   Charges:   PT Evaluation $PT Eval Moderate Complexity: 1 Mod         Ramond Dial 07/25/2020, 4:02 PM Mee Hives, PT [facility] Acute Rehab Dept. Number: [place]

## 2020-07-25 NOTE — Progress Notes (Signed)
PT Cancellation Note  Patient Details Name: Dana [name] MRN: [id number] DOB: 10/26/1955   Cancelled Treatment:    Reason Eval/Treat Not Completed: Medical issues which prohibited therapy.  Her post op shoe has not arrived, and will retry as time and pt allow.   Ramond Dial 07/25/2020, 12:39 PM Mee Hives, PT [facility] Acute Rehab Dept. Number: [place]

## 2020-07-25 NOTE — Progress Notes (Signed)
Patient was observed by nurse tech to have spit out all her medications which were given po by this nurse throughout the day.  The only medications she was compliant with was her antibiotic (which was IV) and her insulin.  When she was questioned, she denied having done this, but then was observed by this nurse to have done the same with her po medications just before she left.  This lends itself to question her home compliance.  Pt discharged and escorted to exit via wheelchair by nurse tech.  Sister here to pick up in private vehicle.

## 2020-07-26 LAB — URINE CULTURE: Culture: >=100000 — AB

## 2020-07-31 LAB — CULTURE, BLOOD (ROUTINE X 2)
Culture: NO GROWTH
Culture: NO GROWTH
Special Requests: ADEQUATE
Special Requests: ADEQUATE

## 2020-08-01 DIAGNOSIS — S199XXA Unspecified injury of neck, initial encounter: Secondary | ICD-10-CM | POA: Diagnosis not present

## 2020-08-01 DIAGNOSIS — Z743 Need for continuous supervision: Secondary | ICD-10-CM | POA: Diagnosis not present

## 2020-08-01 DIAGNOSIS — Z7951 Long term (current) use of inhaled steroids: Secondary | ICD-10-CM | POA: Diagnosis not present

## 2020-08-01 DIAGNOSIS — R2689 Other abnormalities of gait and mobility: Secondary | ICD-10-CM | POA: Diagnosis not present

## 2020-08-01 DIAGNOSIS — I10 Essential (primary) hypertension: Secondary | ICD-10-CM | POA: Diagnosis not present

## 2020-08-01 DIAGNOSIS — Z20822 Contact with and (suspected) exposure to covid-19: Secondary | ICD-10-CM | POA: Diagnosis not present

## 2020-08-01 DIAGNOSIS — Z90710 Acquired absence of both cervix and uterus: Secondary | ICD-10-CM | POA: Diagnosis not present

## 2020-08-01 DIAGNOSIS — W010XXA Fall on same level from slipping, tripping and stumbling without subsequent striking against object, initial encounter: Secondary | ICD-10-CM | POA: Diagnosis not present

## 2020-08-01 DIAGNOSIS — G4733 Obstructive sleep apnea (adult) (pediatric): Secondary | ICD-10-CM | POA: Diagnosis not present

## 2020-08-01 DIAGNOSIS — F339 Major depressive disorder, recurrent, unspecified: Secondary | ICD-10-CM | POA: Diagnosis not present

## 2020-08-01 DIAGNOSIS — W19XXXA Unspecified fall, initial encounter: Secondary | ICD-10-CM | POA: Diagnosis not present

## 2020-08-01 DIAGNOSIS — R404 Transient alteration of awareness: Secondary | ICD-10-CM | POA: Diagnosis not present

## 2020-08-01 DIAGNOSIS — E1165 Type 2 diabetes mellitus with hyperglycemia: Secondary | ICD-10-CM | POA: Diagnosis not present

## 2020-08-01 DIAGNOSIS — J45909 Unspecified asthma, uncomplicated: Secondary | ICD-10-CM | POA: Diagnosis not present

## 2020-08-01 DIAGNOSIS — E785 Hyperlipidemia, unspecified: Secondary | ICD-10-CM | POA: Diagnosis not present

## 2020-08-01 DIAGNOSIS — J9690 Respiratory failure, unspecified, unspecified whether with hypoxia or hypercapnia: Secondary | ICD-10-CM | POA: Diagnosis not present

## 2020-08-01 DIAGNOSIS — R531 Weakness: Secondary | ICD-10-CM | POA: Diagnosis not present

## 2020-08-01 DIAGNOSIS — H5704 Mydriasis: Secondary | ICD-10-CM | POA: Diagnosis not present

## 2020-08-01 DIAGNOSIS — R918 Other nonspecific abnormal finding of lung field: Secondary | ICD-10-CM | POA: Diagnosis not present

## 2020-08-01 DIAGNOSIS — E119 Type 2 diabetes mellitus without complications: Secondary | ICD-10-CM | POA: Diagnosis not present

## 2020-08-01 DIAGNOSIS — Z7982 Long term (current) use of aspirin: Secondary | ICD-10-CM | POA: Diagnosis not present

## 2020-08-01 DIAGNOSIS — K5900 Constipation, unspecified: Secondary | ICD-10-CM | POA: Diagnosis not present

## 2020-08-01 DIAGNOSIS — Z89511 Acquired absence of right leg below knee: Secondary | ICD-10-CM | POA: Diagnosis not present

## 2020-08-01 DIAGNOSIS — I4510 Unspecified right bundle-branch block: Secondary | ICD-10-CM | POA: Diagnosis not present

## 2020-08-01 DIAGNOSIS — I440 Atrioventricular block, first degree: Secondary | ICD-10-CM | POA: Diagnosis not present

## 2020-08-01 DIAGNOSIS — R270 Ataxia, unspecified: Secondary | ICD-10-CM | POA: Diagnosis not present

## 2020-08-01 DIAGNOSIS — H532 Diplopia: Secondary | ICD-10-CM | POA: Diagnosis not present

## 2020-08-01 DIAGNOSIS — Z23 Encounter for immunization: Secondary | ICD-10-CM | POA: Diagnosis not present

## 2020-08-01 DIAGNOSIS — H02846 Edema of left eye, unspecified eyelid: Secondary | ICD-10-CM | POA: Diagnosis not present

## 2020-08-01 DIAGNOSIS — Y999 Unspecified external cause status: Secondary | ICD-10-CM | POA: Diagnosis not present

## 2020-08-01 DIAGNOSIS — N179 Acute kidney failure, unspecified: Secondary | ICD-10-CM | POA: Diagnosis not present

## 2020-08-01 DIAGNOSIS — F319 Bipolar disorder, unspecified: Secondary | ICD-10-CM | POA: Diagnosis not present

## 2020-08-01 DIAGNOSIS — E1142 Type 2 diabetes mellitus with diabetic polyneuropathy: Secondary | ICD-10-CM | POA: Diagnosis not present

## 2020-08-01 DIAGNOSIS — Z9181 History of falling: Secondary | ICD-10-CM | POA: Diagnosis not present

## 2020-08-01 DIAGNOSIS — Z9981 Dependence on supplemental oxygen: Secondary | ICD-10-CM | POA: Diagnosis not present

## 2020-08-01 DIAGNOSIS — H40059 Ocular hypertension, unspecified eye: Secondary | ICD-10-CM | POA: Diagnosis not present

## 2020-08-01 DIAGNOSIS — S0232XS Fracture of orbital floor, left side, sequela: Secondary | ICD-10-CM | POA: Diagnosis not present

## 2020-08-01 DIAGNOSIS — K219 Gastro-esophageal reflux disease without esophagitis: Secondary | ICD-10-CM | POA: Diagnosis not present

## 2020-08-01 DIAGNOSIS — I517 Cardiomegaly: Secondary | ICD-10-CM | POA: Diagnosis not present

## 2020-08-01 DIAGNOSIS — R102 Pelvic and perineal pain: Secondary | ICD-10-CM | POA: Diagnosis not present

## 2020-08-01 DIAGNOSIS — Z043 Encounter for examination and observation following other accident: Secondary | ICD-10-CM | POA: Diagnosis not present

## 2020-08-01 DIAGNOSIS — S0231XS Fracture of orbital floor, right side, sequela: Secondary | ICD-10-CM | POA: Diagnosis not present

## 2020-08-01 DIAGNOSIS — E7800 Pure hypercholesterolemia, unspecified: Secondary | ICD-10-CM | POA: Diagnosis not present

## 2020-08-01 DIAGNOSIS — Z4682 Encounter for fitting and adjustment of non-vascular catheter: Secondary | ICD-10-CM | POA: Diagnosis not present

## 2020-08-01 DIAGNOSIS — S0231XA Fracture of orbital floor, right side, initial encounter for closed fracture: Secondary | ICD-10-CM | POA: Diagnosis not present

## 2020-08-01 DIAGNOSIS — S0285XA Fracture of orbit, unspecified, initial encounter for closed fracture: Secondary | ICD-10-CM | POA: Diagnosis not present

## 2020-08-01 DIAGNOSIS — I2510 Atherosclerotic heart disease of native coronary artery without angina pectoris: Secondary | ICD-10-CM | POA: Diagnosis not present

## 2020-08-01 DIAGNOSIS — S022XXS Fracture of nasal bones, sequela: Secondary | ICD-10-CM | POA: Diagnosis not present

## 2020-08-01 DIAGNOSIS — I700 Atherosclerosis of aorta: Secondary | ICD-10-CM | POA: Diagnosis not present

## 2020-08-01 DIAGNOSIS — F419 Anxiety disorder, unspecified: Secondary | ICD-10-CM | POA: Diagnosis not present

## 2020-08-01 DIAGNOSIS — R609 Edema, unspecified: Secondary | ICD-10-CM | POA: Diagnosis not present

## 2020-08-01 DIAGNOSIS — J9811 Atelectasis: Secondary | ICD-10-CM | POA: Diagnosis not present

## 2020-08-01 DIAGNOSIS — T68XXXA Hypothermia, initial encounter: Secondary | ICD-10-CM | POA: Diagnosis not present

## 2020-08-01 DIAGNOSIS — R41841 Cognitive communication deficit: Secondary | ICD-10-CM | POA: Diagnosis not present

## 2020-08-01 DIAGNOSIS — Z9119 Patient's noncompliance with other medical treatment and regimen: Secondary | ICD-10-CM | POA: Diagnosis not present

## 2020-08-01 DIAGNOSIS — S0232XA Fracture of orbital floor, left side, initial encounter for closed fracture: Secondary | ICD-10-CM | POA: Diagnosis not present

## 2020-08-01 DIAGNOSIS — Z4789 Encounter for other orthopedic aftercare: Secondary | ICD-10-CM | POA: Diagnosis not present

## 2020-08-01 DIAGNOSIS — S022XXA Fracture of nasal bones, initial encounter for closed fracture: Secondary | ICD-10-CM | POA: Diagnosis not present

## 2020-08-01 DIAGNOSIS — S80812A Abrasion, left lower leg, initial encounter: Secondary | ICD-10-CM | POA: Diagnosis not present

## 2020-08-01 DIAGNOSIS — S022XXD Fracture of nasal bones, subsequent encounter for fracture with routine healing: Secondary | ICD-10-CM | POA: Diagnosis not present

## 2020-08-01 DIAGNOSIS — R569 Unspecified convulsions: Secondary | ICD-10-CM | POA: Diagnosis not present

## 2020-08-01 DIAGNOSIS — Z794 Long term (current) use of insulin: Secondary | ICD-10-CM | POA: Diagnosis not present

## 2020-08-01 DIAGNOSIS — Z8614 Personal history of Methicillin resistant Staphylococcus aureus infection: Secondary | ICD-10-CM | POA: Diagnosis not present

## 2020-08-01 DIAGNOSIS — T1490XA Injury, unspecified, initial encounter: Secondary | ICD-10-CM | POA: Diagnosis not present

## 2020-08-01 DIAGNOSIS — H3530 Unspecified macular degeneration: Secondary | ICD-10-CM | POA: Diagnosis not present

## 2020-08-01 DIAGNOSIS — R0689 Other abnormalities of breathing: Secondary | ICD-10-CM | POA: Diagnosis not present

## 2020-08-01 DIAGNOSIS — D62 Acute posthemorrhagic anemia: Secondary | ICD-10-CM | POA: Diagnosis not present

## 2020-08-02 DIAGNOSIS — N179 Acute kidney failure, unspecified: Secondary | ICD-10-CM | POA: Diagnosis not present

## 2020-08-02 DIAGNOSIS — E119 Type 2 diabetes mellitus without complications: Secondary | ICD-10-CM | POA: Diagnosis not present

## 2020-08-03 DIAGNOSIS — S022XXA Fracture of nasal bones, initial encounter for closed fracture: Secondary | ICD-10-CM | POA: Diagnosis not present

## 2020-08-03 DIAGNOSIS — S0232XA Fracture of orbital floor, left side, initial encounter for closed fracture: Secondary | ICD-10-CM | POA: Diagnosis not present

## 2020-08-03 DIAGNOSIS — W19XXXA Unspecified fall, initial encounter: Secondary | ICD-10-CM | POA: Diagnosis not present

## 2020-08-03 DIAGNOSIS — S0231XA Fracture of orbital floor, right side, initial encounter for closed fracture: Secondary | ICD-10-CM | POA: Diagnosis not present

## 2020-08-04 DIAGNOSIS — S022XXA Fracture of nasal bones, initial encounter for closed fracture: Secondary | ICD-10-CM | POA: Diagnosis not present

## 2020-08-04 DIAGNOSIS — W19XXXA Unspecified fall, initial encounter: Secondary | ICD-10-CM | POA: Diagnosis not present

## 2020-08-04 DIAGNOSIS — S0231XA Fracture of orbital floor, right side, initial encounter for closed fracture: Secondary | ICD-10-CM | POA: Diagnosis not present

## 2020-08-04 DIAGNOSIS — S0232XA Fracture of orbital floor, left side, initial encounter for closed fracture: Secondary | ICD-10-CM | POA: Diagnosis not present

## 2020-08-05 DIAGNOSIS — E785 Hyperlipidemia, unspecified: Secondary | ICD-10-CM | POA: Diagnosis not present

## 2020-08-05 DIAGNOSIS — E119 Type 2 diabetes mellitus without complications: Secondary | ICD-10-CM | POA: Diagnosis not present

## 2020-08-05 DIAGNOSIS — Z7982 Long term (current) use of aspirin: Secondary | ICD-10-CM | POA: Diagnosis not present

## 2020-08-05 DIAGNOSIS — G4733 Obstructive sleep apnea (adult) (pediatric): Secondary | ICD-10-CM | POA: Diagnosis not present

## 2020-08-05 DIAGNOSIS — D62 Acute posthemorrhagic anemia: Secondary | ICD-10-CM | POA: Diagnosis not present

## 2020-08-05 DIAGNOSIS — F319 Bipolar disorder, unspecified: Secondary | ICD-10-CM | POA: Diagnosis not present

## 2020-08-05 DIAGNOSIS — Z9981 Dependence on supplemental oxygen: Secondary | ICD-10-CM | POA: Diagnosis not present

## 2020-08-05 DIAGNOSIS — N179 Acute kidney failure, unspecified: Secondary | ICD-10-CM | POA: Diagnosis not present

## 2020-08-05 DIAGNOSIS — K219 Gastro-esophageal reflux disease without esophagitis: Secondary | ICD-10-CM | POA: Diagnosis not present

## 2020-08-06 DIAGNOSIS — G4733 Obstructive sleep apnea (adult) (pediatric): Secondary | ICD-10-CM | POA: Diagnosis not present

## 2020-08-06 DIAGNOSIS — Z9981 Dependence on supplemental oxygen: Secondary | ICD-10-CM | POA: Diagnosis not present

## 2020-08-06 DIAGNOSIS — N179 Acute kidney failure, unspecified: Secondary | ICD-10-CM | POA: Diagnosis not present

## 2020-08-06 DIAGNOSIS — D62 Acute posthemorrhagic anemia: Secondary | ICD-10-CM | POA: Diagnosis not present

## 2020-08-06 DIAGNOSIS — K219 Gastro-esophageal reflux disease without esophagitis: Secondary | ICD-10-CM | POA: Diagnosis not present

## 2020-08-06 DIAGNOSIS — E785 Hyperlipidemia, unspecified: Secondary | ICD-10-CM | POA: Diagnosis not present

## 2020-08-06 DIAGNOSIS — Z7982 Long term (current) use of aspirin: Secondary | ICD-10-CM | POA: Diagnosis not present

## 2020-08-06 DIAGNOSIS — F319 Bipolar disorder, unspecified: Secondary | ICD-10-CM | POA: Diagnosis not present

## 2020-08-06 DIAGNOSIS — E119 Type 2 diabetes mellitus without complications: Secondary | ICD-10-CM | POA: Diagnosis not present

## 2020-08-08 DIAGNOSIS — S0232XA Fracture of orbital floor, left side, initial encounter for closed fracture: Secondary | ICD-10-CM | POA: Diagnosis not present

## 2020-08-08 DIAGNOSIS — R41841 Cognitive communication deficit: Secondary | ICD-10-CM | POA: Diagnosis not present

## 2020-08-08 DIAGNOSIS — S0231XA Fracture of orbital floor, right side, initial encounter for closed fracture: Secondary | ICD-10-CM | POA: Diagnosis not present

## 2020-08-08 DIAGNOSIS — Z743 Need for continuous supervision: Secondary | ICD-10-CM | POA: Diagnosis not present

## 2020-08-08 DIAGNOSIS — S0285XA Fracture of orbit, unspecified, initial encounter for closed fracture: Secondary | ICD-10-CM | POA: Diagnosis not present

## 2020-08-08 DIAGNOSIS — J45909 Unspecified asthma, uncomplicated: Secondary | ICD-10-CM | POA: Diagnosis not present

## 2020-08-08 DIAGNOSIS — R569 Unspecified convulsions: Secondary | ICD-10-CM | POA: Diagnosis not present

## 2020-08-08 DIAGNOSIS — R5381 Other malaise: Secondary | ICD-10-CM | POA: Diagnosis not present

## 2020-08-08 DIAGNOSIS — Z89511 Acquired absence of right leg below knee: Secondary | ICD-10-CM | POA: Diagnosis not present

## 2020-08-08 DIAGNOSIS — F419 Anxiety disorder, unspecified: Secondary | ICD-10-CM | POA: Diagnosis not present

## 2020-08-08 DIAGNOSIS — R2689 Other abnormalities of gait and mobility: Secondary | ICD-10-CM | POA: Diagnosis not present

## 2020-08-08 DIAGNOSIS — S0232XS Fracture of orbital floor, left side, sequela: Secondary | ICD-10-CM | POA: Diagnosis not present

## 2020-08-08 DIAGNOSIS — E785 Hyperlipidemia, unspecified: Secondary | ICD-10-CM | POA: Diagnosis not present

## 2020-08-08 DIAGNOSIS — L97429 Non-pressure chronic ulcer of left heel and midfoot with unspecified severity: Secondary | ICD-10-CM | POA: Diagnosis not present

## 2020-08-08 DIAGNOSIS — K219 Gastro-esophageal reflux disease without esophagitis: Secondary | ICD-10-CM | POA: Diagnosis not present

## 2020-08-08 DIAGNOSIS — E119 Type 2 diabetes mellitus without complications: Secondary | ICD-10-CM | POA: Diagnosis not present

## 2020-08-08 DIAGNOSIS — S022XXA Fracture of nasal bones, initial encounter for closed fracture: Secondary | ICD-10-CM | POA: Diagnosis not present

## 2020-08-08 DIAGNOSIS — S0231XS Fracture of orbital floor, right side, sequela: Secondary | ICD-10-CM | POA: Diagnosis not present

## 2020-08-08 DIAGNOSIS — S022XXD Fracture of nasal bones, subsequent encounter for fracture with routine healing: Secondary | ICD-10-CM | POA: Diagnosis not present

## 2020-08-08 DIAGNOSIS — Z9181 History of falling: Secondary | ICD-10-CM | POA: Diagnosis not present

## 2020-08-08 DIAGNOSIS — E1169 Type 2 diabetes mellitus with other specified complication: Secondary | ICD-10-CM | POA: Diagnosis not present

## 2020-08-08 DIAGNOSIS — F339 Major depressive disorder, recurrent, unspecified: Secondary | ICD-10-CM | POA: Diagnosis not present

## 2020-08-08 DIAGNOSIS — I10 Essential (primary) hypertension: Secondary | ICD-10-CM | POA: Diagnosis not present

## 2020-08-08 DIAGNOSIS — T1490XA Injury, unspecified, initial encounter: Secondary | ICD-10-CM | POA: Diagnosis not present

## 2020-08-08 DIAGNOSIS — Z4789 Encounter for other orthopedic aftercare: Secondary | ICD-10-CM | POA: Diagnosis not present

## 2020-08-08 DIAGNOSIS — S022XXS Fracture of nasal bones, sequela: Secondary | ICD-10-CM | POA: Diagnosis not present

## 2020-08-08 DIAGNOSIS — R270 Ataxia, unspecified: Secondary | ICD-10-CM | POA: Diagnosis not present

## 2020-08-09 DIAGNOSIS — R5381 Other malaise: Secondary | ICD-10-CM | POA: Diagnosis not present

## 2020-08-09 DIAGNOSIS — L97429 Non-pressure chronic ulcer of left heel and midfoot with unspecified severity: Secondary | ICD-10-CM | POA: Diagnosis not present

## 2020-08-09 DIAGNOSIS — S022XXA Fracture of nasal bones, initial encounter for closed fracture: Secondary | ICD-10-CM | POA: Diagnosis not present

## 2020-08-09 DIAGNOSIS — S0285XA Fracture of orbit, unspecified, initial encounter for closed fracture: Secondary | ICD-10-CM | POA: Diagnosis not present

## 2020-08-09 DIAGNOSIS — E1169 Type 2 diabetes mellitus with other specified complication: Secondary | ICD-10-CM | POA: Diagnosis not present

## 2020-08-10 DIAGNOSIS — Z4789 Encounter for other orthopedic aftercare: Secondary | ICD-10-CM | POA: Diagnosis not present

## 2020-08-10 DIAGNOSIS — S0231XS Fracture of orbital floor, right side, sequela: Secondary | ICD-10-CM | POA: Diagnosis not present

## 2020-08-10 DIAGNOSIS — J45909 Unspecified asthma, uncomplicated: Secondary | ICD-10-CM | POA: Diagnosis not present

## 2020-08-10 DIAGNOSIS — R41841 Cognitive communication deficit: Secondary | ICD-10-CM | POA: Diagnosis not present

## 2020-08-10 DIAGNOSIS — E119 Type 2 diabetes mellitus without complications: Secondary | ICD-10-CM | POA: Diagnosis not present

## 2020-08-10 DIAGNOSIS — F419 Anxiety disorder, unspecified: Secondary | ICD-10-CM | POA: Diagnosis not present

## 2020-08-10 DIAGNOSIS — F339 Major depressive disorder, recurrent, unspecified: Secondary | ICD-10-CM | POA: Diagnosis not present

## 2020-08-10 DIAGNOSIS — S022XXD Fracture of nasal bones, subsequent encounter for fracture with routine healing: Secondary | ICD-10-CM | POA: Diagnosis not present

## 2020-08-10 DIAGNOSIS — S0232XS Fracture of orbital floor, left side, sequela: Secondary | ICD-10-CM | POA: Diagnosis not present

## 2020-08-10 DIAGNOSIS — E1169 Type 2 diabetes mellitus with other specified complication: Secondary | ICD-10-CM | POA: Diagnosis not present

## 2020-08-10 DIAGNOSIS — R569 Unspecified convulsions: Secondary | ICD-10-CM | POA: Diagnosis not present

## 2020-08-10 DIAGNOSIS — S0285XA Fracture of orbit, unspecified, initial encounter for closed fracture: Secondary | ICD-10-CM | POA: Diagnosis not present

## 2020-08-10 DIAGNOSIS — E785 Hyperlipidemia, unspecified: Secondary | ICD-10-CM | POA: Diagnosis not present

## 2020-08-10 DIAGNOSIS — S022XXS Fracture of nasal bones, sequela: Secondary | ICD-10-CM | POA: Diagnosis not present

## 2020-08-10 DIAGNOSIS — S022XXA Fracture of nasal bones, initial encounter for closed fracture: Secondary | ICD-10-CM | POA: Diagnosis not present

## 2020-08-10 DIAGNOSIS — I10 Essential (primary) hypertension: Secondary | ICD-10-CM | POA: Diagnosis not present

## 2020-08-10 DIAGNOSIS — Z89511 Acquired absence of right leg below knee: Secondary | ICD-10-CM | POA: Diagnosis not present

## 2020-08-10 DIAGNOSIS — R2689 Other abnormalities of gait and mobility: Secondary | ICD-10-CM | POA: Diagnosis not present

## 2020-08-10 DIAGNOSIS — Z9181 History of falling: Secondary | ICD-10-CM | POA: Diagnosis not present

## 2020-08-10 DIAGNOSIS — R5381 Other malaise: Secondary | ICD-10-CM | POA: Diagnosis not present

## 2020-08-10 DIAGNOSIS — K219 Gastro-esophageal reflux disease without esophagitis: Secondary | ICD-10-CM | POA: Diagnosis not present

## 2020-08-13 DIAGNOSIS — E1169 Type 2 diabetes mellitus with other specified complication: Secondary | ICD-10-CM | POA: Diagnosis not present

## 2020-08-13 DIAGNOSIS — E785 Hyperlipidemia, unspecified: Secondary | ICD-10-CM | POA: Diagnosis not present

## 2020-08-14 DIAGNOSIS — R5381 Other malaise: Secondary | ICD-10-CM | POA: Diagnosis not present

## 2020-08-14 DIAGNOSIS — E1169 Type 2 diabetes mellitus with other specified complication: Secondary | ICD-10-CM | POA: Diagnosis not present

## 2020-08-14 DIAGNOSIS — S022XXA Fracture of nasal bones, initial encounter for closed fracture: Secondary | ICD-10-CM | POA: Diagnosis not present

## 2020-08-14 DIAGNOSIS — S0285XA Fracture of orbit, unspecified, initial encounter for closed fracture: Secondary | ICD-10-CM | POA: Diagnosis not present

## 2020-08-24 DIAGNOSIS — F411 Generalized anxiety disorder: Secondary | ICD-10-CM | POA: Diagnosis not present

## 2020-08-24 DIAGNOSIS — F331 Major depressive disorder, recurrent, moderate: Secondary | ICD-10-CM | POA: Diagnosis not present

## 2020-08-27 DIAGNOSIS — E785 Hyperlipidemia, unspecified: Secondary | ICD-10-CM | POA: Diagnosis not present

## 2020-08-27 DIAGNOSIS — Z79899 Other long term (current) drug therapy: Secondary | ICD-10-CM | POA: Diagnosis not present

## 2020-08-27 DIAGNOSIS — E872 Acidosis: Secondary | ICD-10-CM | POA: Diagnosis not present

## 2020-08-27 DIAGNOSIS — Z20822 Contact with and (suspected) exposure to covid-19: Secondary | ICD-10-CM | POA: Diagnosis not present

## 2020-08-27 DIAGNOSIS — I739 Peripheral vascular disease, unspecified: Secondary | ICD-10-CM | POA: Diagnosis not present

## 2020-08-27 DIAGNOSIS — F319 Bipolar disorder, unspecified: Secondary | ICD-10-CM | POA: Diagnosis not present

## 2020-08-27 DIAGNOSIS — Z781 Physical restraint status: Secondary | ICD-10-CM | POA: Diagnosis not present

## 2020-08-27 DIAGNOSIS — J45909 Unspecified asthma, uncomplicated: Secondary | ICD-10-CM | POA: Diagnosis not present

## 2020-08-27 DIAGNOSIS — R404 Transient alteration of awareness: Secondary | ICD-10-CM | POA: Diagnosis not present

## 2020-08-27 DIAGNOSIS — Z7984 Long term (current) use of oral hypoglycemic drugs: Secondary | ICD-10-CM | POA: Diagnosis not present

## 2020-08-27 DIAGNOSIS — R4182 Altered mental status, unspecified: Secondary | ICD-10-CM | POA: Diagnosis not present

## 2020-08-27 DIAGNOSIS — R451 Restlessness and agitation: Secondary | ICD-10-CM | POA: Diagnosis not present

## 2020-08-27 DIAGNOSIS — Z89422 Acquired absence of other left toe(s): Secondary | ICD-10-CM | POA: Diagnosis not present

## 2020-08-27 DIAGNOSIS — I517 Cardiomegaly: Secondary | ICD-10-CM | POA: Diagnosis not present

## 2020-08-27 DIAGNOSIS — I129 Hypertensive chronic kidney disease with stage 1 through stage 4 chronic kidney disease, or unspecified chronic kidney disease: Secondary | ICD-10-CM | POA: Diagnosis not present

## 2020-08-27 DIAGNOSIS — R410 Disorientation, unspecified: Secondary | ICD-10-CM | POA: Diagnosis not present

## 2020-08-27 DIAGNOSIS — N179 Acute kidney failure, unspecified: Secondary | ICD-10-CM | POA: Diagnosis not present

## 2020-08-27 DIAGNOSIS — R0902 Hypoxemia: Secondary | ICD-10-CM | POA: Diagnosis not present

## 2020-08-27 DIAGNOSIS — G2581 Restless legs syndrome: Secondary | ICD-10-CM | POA: Diagnosis not present

## 2020-08-27 DIAGNOSIS — Z9119 Patient's noncompliance with other medical treatment and regimen: Secondary | ICD-10-CM | POA: Diagnosis not present

## 2020-08-27 DIAGNOSIS — Z794 Long term (current) use of insulin: Secondary | ICD-10-CM | POA: Diagnosis not present

## 2020-08-27 DIAGNOSIS — G4733 Obstructive sleep apnea (adult) (pediatric): Secondary | ICD-10-CM | POA: Diagnosis not present

## 2020-08-27 DIAGNOSIS — Z8614 Personal history of Methicillin resistant Staphylococcus aureus infection: Secondary | ICD-10-CM | POA: Diagnosis not present

## 2020-08-27 DIAGNOSIS — E1122 Type 2 diabetes mellitus with diabetic chronic kidney disease: Secondary | ICD-10-CM | POA: Diagnosis not present

## 2020-08-27 DIAGNOSIS — Z89511 Acquired absence of right leg below knee: Secondary | ICD-10-CM | POA: Diagnosis not present

## 2020-08-27 DIAGNOSIS — G9341 Metabolic encephalopathy: Secondary | ICD-10-CM | POA: Diagnosis not present

## 2020-08-27 DIAGNOSIS — I4510 Unspecified right bundle-branch block: Secondary | ICD-10-CM | POA: Diagnosis not present

## 2020-08-27 DIAGNOSIS — E11649 Type 2 diabetes mellitus with hypoglycemia without coma: Secondary | ICD-10-CM | POA: Diagnosis not present

## 2020-08-27 DIAGNOSIS — E11621 Type 2 diabetes mellitus with foot ulcer: Secondary | ICD-10-CM | POA: Diagnosis not present

## 2020-08-27 DIAGNOSIS — Z90710 Acquired absence of both cervix and uterus: Secondary | ICD-10-CM | POA: Diagnosis not present

## 2020-08-27 DIAGNOSIS — R000 Tachycardia, unspecified: Secondary | ICD-10-CM | POA: Diagnosis not present

## 2020-08-27 DIAGNOSIS — R279 Unspecified lack of coordination: Secondary | ICD-10-CM | POA: Diagnosis not present

## 2020-08-27 DIAGNOSIS — R159 Full incontinence of feces: Secondary | ICD-10-CM | POA: Diagnosis not present

## 2020-08-27 DIAGNOSIS — S022XXA Fracture of nasal bones, initial encounter for closed fracture: Secondary | ICD-10-CM | POA: Diagnosis not present

## 2020-08-27 DIAGNOSIS — N189 Chronic kidney disease, unspecified: Secondary | ICD-10-CM | POA: Diagnosis not present

## 2020-08-27 DIAGNOSIS — E1140 Type 2 diabetes mellitus with diabetic neuropathy, unspecified: Secondary | ICD-10-CM | POA: Diagnosis not present

## 2020-08-27 DIAGNOSIS — Z7982 Long term (current) use of aspirin: Secondary | ICD-10-CM | POA: Diagnosis not present

## 2020-08-27 DIAGNOSIS — Z7409 Other reduced mobility: Secondary | ICD-10-CM | POA: Diagnosis not present

## 2020-08-27 DIAGNOSIS — Z1612 Extended spectrum beta lactamase (ESBL) resistance: Secondary | ICD-10-CM | POA: Diagnosis not present

## 2020-08-27 DIAGNOSIS — Z9989 Dependence on other enabling machines and devices: Secondary | ICD-10-CM | POA: Diagnosis not present

## 2020-08-27 DIAGNOSIS — N3090 Cystitis, unspecified without hematuria: Secondary | ICD-10-CM | POA: Diagnosis not present

## 2020-08-27 DIAGNOSIS — B961 Klebsiella pneumoniae [K. pneumoniae] as the cause of diseases classified elsewhere: Secondary | ICD-10-CM | POA: Diagnosis not present

## 2020-08-27 DIAGNOSIS — Z743 Need for continuous supervision: Secondary | ICD-10-CM | POA: Diagnosis not present

## 2020-08-27 DIAGNOSIS — L97529 Non-pressure chronic ulcer of other part of left foot with unspecified severity: Secondary | ICD-10-CM | POA: Diagnosis not present

## 2020-09-04 DIAGNOSIS — E11621 Type 2 diabetes mellitus with foot ulcer: Secondary | ICD-10-CM | POA: Diagnosis not present

## 2020-09-04 DIAGNOSIS — I454 Nonspecific intraventricular block: Secondary | ICD-10-CM | POA: Diagnosis not present

## 2020-09-04 DIAGNOSIS — R110 Nausea: Secondary | ICD-10-CM | POA: Diagnosis not present

## 2020-09-04 DIAGNOSIS — R442 Other hallucinations: Secondary | ICD-10-CM | POA: Diagnosis not present

## 2020-09-04 DIAGNOSIS — E860 Dehydration: Secondary | ICD-10-CM | POA: Diagnosis not present

## 2020-09-04 DIAGNOSIS — K529 Noninfective gastroenteritis and colitis, unspecified: Secondary | ICD-10-CM | POA: Diagnosis not present

## 2020-09-04 DIAGNOSIS — E876 Hypokalemia: Secondary | ICD-10-CM | POA: Diagnosis not present

## 2020-09-04 DIAGNOSIS — R112 Nausea with vomiting, unspecified: Secondary | ICD-10-CM | POA: Diagnosis not present

## 2020-09-04 DIAGNOSIS — Z794 Long term (current) use of insulin: Secondary | ICD-10-CM | POA: Diagnosis not present

## 2020-09-04 DIAGNOSIS — N183 Chronic kidney disease, stage 3 unspecified: Secondary | ICD-10-CM | POA: Diagnosis not present

## 2020-09-04 DIAGNOSIS — K8020 Calculus of gallbladder without cholecystitis without obstruction: Secondary | ICD-10-CM | POA: Diagnosis not present

## 2020-09-04 DIAGNOSIS — R279 Unspecified lack of coordination: Secondary | ICD-10-CM | POA: Diagnosis not present

## 2020-09-04 DIAGNOSIS — E875 Hyperkalemia: Secondary | ICD-10-CM | POA: Diagnosis not present

## 2020-09-04 DIAGNOSIS — I959 Hypotension, unspecified: Secondary | ICD-10-CM | POA: Diagnosis not present

## 2020-09-04 DIAGNOSIS — I4510 Unspecified right bundle-branch block: Secondary | ICD-10-CM | POA: Diagnosis not present

## 2020-09-04 DIAGNOSIS — E119 Type 2 diabetes mellitus without complications: Secondary | ICD-10-CM | POA: Diagnosis not present

## 2020-09-04 DIAGNOSIS — D631 Anemia in chronic kidney disease: Secondary | ICD-10-CM | POA: Diagnosis not present

## 2020-09-04 DIAGNOSIS — E1122 Type 2 diabetes mellitus with diabetic chronic kidney disease: Secondary | ICD-10-CM | POA: Diagnosis not present

## 2020-09-04 DIAGNOSIS — R1110 Vomiting, unspecified: Secondary | ICD-10-CM | POA: Diagnosis not present

## 2020-09-04 DIAGNOSIS — K920 Hematemesis: Secondary | ICD-10-CM | POA: Diagnosis not present

## 2020-09-04 DIAGNOSIS — E872 Acidosis: Secondary | ICD-10-CM | POA: Diagnosis not present

## 2020-09-04 DIAGNOSIS — E1110 Type 2 diabetes mellitus with ketoacidosis without coma: Secondary | ICD-10-CM | POA: Diagnosis not present

## 2020-09-04 DIAGNOSIS — W19XXXA Unspecified fall, initial encounter: Secondary | ICD-10-CM | POA: Diagnosis not present

## 2020-09-04 DIAGNOSIS — K449 Diaphragmatic hernia without obstruction or gangrene: Secondary | ICD-10-CM | POA: Diagnosis not present

## 2020-09-04 DIAGNOSIS — R1084 Generalized abdominal pain: Secondary | ICD-10-CM | POA: Diagnosis not present

## 2020-09-04 DIAGNOSIS — I10 Essential (primary) hypertension: Secondary | ICD-10-CM | POA: Diagnosis not present

## 2020-09-04 DIAGNOSIS — J9811 Atelectasis: Secondary | ICD-10-CM | POA: Diagnosis not present

## 2020-09-04 DIAGNOSIS — S022XXA Fracture of nasal bones, initial encounter for closed fracture: Secondary | ICD-10-CM | POA: Diagnosis not present

## 2020-09-04 DIAGNOSIS — Z781 Physical restraint status: Secondary | ICD-10-CM | POA: Diagnosis not present

## 2020-09-04 DIAGNOSIS — R52 Pain, unspecified: Secondary | ICD-10-CM | POA: Diagnosis not present

## 2020-09-04 DIAGNOSIS — I129 Hypertensive chronic kidney disease with stage 1 through stage 4 chronic kidney disease, or unspecified chronic kidney disease: Secondary | ICD-10-CM | POA: Diagnosis not present

## 2020-09-04 DIAGNOSIS — E1151 Type 2 diabetes mellitus with diabetic peripheral angiopathy without gangrene: Secondary | ICD-10-CM | POA: Diagnosis not present

## 2020-09-04 DIAGNOSIS — G9340 Encephalopathy, unspecified: Secondary | ICD-10-CM | POA: Diagnosis not present

## 2020-09-04 DIAGNOSIS — L97429 Non-pressure chronic ulcer of left heel and midfoot with unspecified severity: Secondary | ICD-10-CM | POA: Diagnosis not present

## 2020-09-04 DIAGNOSIS — K922 Gastrointestinal hemorrhage, unspecified: Secondary | ICD-10-CM | POA: Diagnosis not present

## 2020-09-04 DIAGNOSIS — R4182 Altered mental status, unspecified: Secondary | ICD-10-CM | POA: Diagnosis not present

## 2020-09-04 DIAGNOSIS — R1111 Vomiting without nausea: Secondary | ICD-10-CM | POA: Diagnosis not present

## 2020-09-04 DIAGNOSIS — F319 Bipolar disorder, unspecified: Secondary | ICD-10-CM | POA: Diagnosis not present

## 2020-09-04 DIAGNOSIS — Z743 Need for continuous supervision: Secondary | ICD-10-CM | POA: Diagnosis not present

## 2020-09-04 DIAGNOSIS — N179 Acute kidney failure, unspecified: Secondary | ICD-10-CM | POA: Diagnosis not present

## 2020-09-04 DIAGNOSIS — E11622 Type 2 diabetes mellitus with other skin ulcer: Secondary | ICD-10-CM | POA: Diagnosis not present

## 2020-09-04 DIAGNOSIS — G9341 Metabolic encephalopathy: Secondary | ICD-10-CM | POA: Diagnosis not present

## 2020-09-04 DIAGNOSIS — D649 Anemia, unspecified: Secondary | ICD-10-CM | POA: Diagnosis not present

## 2020-09-04 DIAGNOSIS — Z89511 Acquired absence of right leg below knee: Secondary | ICD-10-CM | POA: Diagnosis not present

## 2020-09-04 DIAGNOSIS — E1142 Type 2 diabetes mellitus with diabetic polyneuropathy: Secondary | ICD-10-CM | POA: Diagnosis not present

## 2020-09-05 DIAGNOSIS — Z794 Long term (current) use of insulin: Secondary | ICD-10-CM | POA: Diagnosis not present

## 2020-09-05 DIAGNOSIS — E875 Hyperkalemia: Secondary | ICD-10-CM | POA: Diagnosis not present

## 2020-09-05 DIAGNOSIS — J9811 Atelectasis: Secondary | ICD-10-CM | POA: Diagnosis not present

## 2020-09-05 DIAGNOSIS — N183 Chronic kidney disease, stage 3 unspecified: Secondary | ICD-10-CM | POA: Diagnosis not present

## 2020-09-05 DIAGNOSIS — G9340 Encephalopathy, unspecified: Secondary | ICD-10-CM | POA: Diagnosis not present

## 2020-09-05 DIAGNOSIS — E1122 Type 2 diabetes mellitus with diabetic chronic kidney disease: Secondary | ICD-10-CM | POA: Diagnosis not present

## 2020-09-05 DIAGNOSIS — L97429 Non-pressure chronic ulcer of left heel and midfoot with unspecified severity: Secondary | ICD-10-CM | POA: Diagnosis not present

## 2020-09-05 DIAGNOSIS — N179 Acute kidney failure, unspecified: Secondary | ICD-10-CM | POA: Diagnosis not present

## 2020-09-05 DIAGNOSIS — I454 Nonspecific intraventricular block: Secondary | ICD-10-CM | POA: Diagnosis not present

## 2020-09-05 DIAGNOSIS — R4182 Altered mental status, unspecified: Secondary | ICD-10-CM | POA: Diagnosis not present

## 2020-09-05 DIAGNOSIS — E872 Acidosis: Secondary | ICD-10-CM | POA: Diagnosis not present

## 2020-09-05 DIAGNOSIS — F319 Bipolar disorder, unspecified: Secondary | ICD-10-CM | POA: Diagnosis not present

## 2020-09-05 DIAGNOSIS — E11621 Type 2 diabetes mellitus with foot ulcer: Secondary | ICD-10-CM | POA: Diagnosis not present

## 2020-09-06 DIAGNOSIS — N183 Chronic kidney disease, stage 3 unspecified: Secondary | ICD-10-CM | POA: Diagnosis not present

## 2020-09-06 DIAGNOSIS — R4182 Altered mental status, unspecified: Secondary | ICD-10-CM | POA: Diagnosis not present

## 2020-09-06 DIAGNOSIS — F319 Bipolar disorder, unspecified: Secondary | ICD-10-CM | POA: Diagnosis not present

## 2020-09-06 DIAGNOSIS — E875 Hyperkalemia: Secondary | ICD-10-CM | POA: Diagnosis not present

## 2020-09-06 DIAGNOSIS — I10 Essential (primary) hypertension: Secondary | ICD-10-CM | POA: Diagnosis not present

## 2020-09-06 DIAGNOSIS — E11621 Type 2 diabetes mellitus with foot ulcer: Secondary | ICD-10-CM | POA: Diagnosis not present

## 2020-09-06 DIAGNOSIS — E1122 Type 2 diabetes mellitus with diabetic chronic kidney disease: Secondary | ICD-10-CM | POA: Diagnosis not present

## 2020-09-06 DIAGNOSIS — E119 Type 2 diabetes mellitus without complications: Secondary | ICD-10-CM | POA: Diagnosis not present

## 2020-09-06 DIAGNOSIS — L97429 Non-pressure chronic ulcer of left heel and midfoot with unspecified severity: Secondary | ICD-10-CM | POA: Diagnosis not present

## 2020-09-06 DIAGNOSIS — G9340 Encephalopathy, unspecified: Secondary | ICD-10-CM | POA: Diagnosis not present

## 2020-09-06 DIAGNOSIS — Z794 Long term (current) use of insulin: Secondary | ICD-10-CM | POA: Diagnosis not present

## 2020-09-06 DIAGNOSIS — E872 Acidosis: Secondary | ICD-10-CM | POA: Diagnosis not present

## 2020-09-06 DIAGNOSIS — N179 Acute kidney failure, unspecified: Secondary | ICD-10-CM | POA: Diagnosis not present

## 2020-09-07 DIAGNOSIS — N179 Acute kidney failure, unspecified: Secondary | ICD-10-CM | POA: Diagnosis not present

## 2020-09-07 DIAGNOSIS — R4182 Altered mental status, unspecified: Secondary | ICD-10-CM | POA: Diagnosis not present

## 2020-09-07 DIAGNOSIS — E875 Hyperkalemia: Secondary | ICD-10-CM | POA: Diagnosis not present

## 2020-09-07 DIAGNOSIS — L97429 Non-pressure chronic ulcer of left heel and midfoot with unspecified severity: Secondary | ICD-10-CM | POA: Diagnosis not present

## 2020-09-07 DIAGNOSIS — E119 Type 2 diabetes mellitus without complications: Secondary | ICD-10-CM | POA: Diagnosis not present

## 2020-09-07 DIAGNOSIS — N183 Chronic kidney disease, stage 3 unspecified: Secondary | ICD-10-CM | POA: Diagnosis not present

## 2020-09-07 DIAGNOSIS — E872 Acidosis: Secondary | ICD-10-CM | POA: Diagnosis not present

## 2020-09-07 DIAGNOSIS — E1122 Type 2 diabetes mellitus with diabetic chronic kidney disease: Secondary | ICD-10-CM | POA: Diagnosis not present

## 2020-09-07 DIAGNOSIS — F319 Bipolar disorder, unspecified: Secondary | ICD-10-CM | POA: Diagnosis not present

## 2020-09-07 DIAGNOSIS — G9340 Encephalopathy, unspecified: Secondary | ICD-10-CM | POA: Diagnosis not present

## 2020-09-07 DIAGNOSIS — E11621 Type 2 diabetes mellitus with foot ulcer: Secondary | ICD-10-CM | POA: Diagnosis not present

## 2020-09-07 DIAGNOSIS — Z794 Long term (current) use of insulin: Secondary | ICD-10-CM | POA: Diagnosis not present

## 2020-09-08 DIAGNOSIS — G9340 Encephalopathy, unspecified: Secondary | ICD-10-CM | POA: Diagnosis not present

## 2020-09-08 DIAGNOSIS — E1122 Type 2 diabetes mellitus with diabetic chronic kidney disease: Secondary | ICD-10-CM | POA: Diagnosis not present

## 2020-09-08 DIAGNOSIS — Z794 Long term (current) use of insulin: Secondary | ICD-10-CM | POA: Diagnosis not present

## 2020-09-08 DIAGNOSIS — N183 Chronic kidney disease, stage 3 unspecified: Secondary | ICD-10-CM | POA: Diagnosis not present

## 2020-09-08 DIAGNOSIS — N179 Acute kidney failure, unspecified: Secondary | ICD-10-CM | POA: Diagnosis not present

## 2020-09-08 DIAGNOSIS — R4182 Altered mental status, unspecified: Secondary | ICD-10-CM | POA: Diagnosis not present

## 2020-09-08 DIAGNOSIS — F319 Bipolar disorder, unspecified: Secondary | ICD-10-CM | POA: Diagnosis not present

## 2020-09-08 DIAGNOSIS — E872 Acidosis: Secondary | ICD-10-CM | POA: Diagnosis not present

## 2020-09-08 DIAGNOSIS — E119 Type 2 diabetes mellitus without complications: Secondary | ICD-10-CM | POA: Diagnosis not present

## 2020-09-09 DIAGNOSIS — E11621 Type 2 diabetes mellitus with foot ulcer: Secondary | ICD-10-CM | POA: Diagnosis not present

## 2020-09-09 DIAGNOSIS — F319 Bipolar disorder, unspecified: Secondary | ICD-10-CM | POA: Diagnosis not present

## 2020-09-09 DIAGNOSIS — E872 Acidosis: Secondary | ICD-10-CM | POA: Diagnosis not present

## 2020-09-09 DIAGNOSIS — L97429 Non-pressure chronic ulcer of left heel and midfoot with unspecified severity: Secondary | ICD-10-CM | POA: Diagnosis not present

## 2020-09-09 DIAGNOSIS — N179 Acute kidney failure, unspecified: Secondary | ICD-10-CM | POA: Diagnosis not present

## 2020-09-09 DIAGNOSIS — E119 Type 2 diabetes mellitus without complications: Secondary | ICD-10-CM | POA: Diagnosis not present

## 2020-09-09 DIAGNOSIS — G9340 Encephalopathy, unspecified: Secondary | ICD-10-CM | POA: Diagnosis not present

## 2020-09-09 DIAGNOSIS — E1122 Type 2 diabetes mellitus with diabetic chronic kidney disease: Secondary | ICD-10-CM | POA: Diagnosis not present

## 2020-09-09 DIAGNOSIS — R4182 Altered mental status, unspecified: Secondary | ICD-10-CM | POA: Diagnosis not present

## 2020-09-09 DIAGNOSIS — Z794 Long term (current) use of insulin: Secondary | ICD-10-CM | POA: Diagnosis not present

## 2020-09-09 DIAGNOSIS — N183 Chronic kidney disease, stage 3 unspecified: Secondary | ICD-10-CM | POA: Diagnosis not present

## 2020-09-10 DIAGNOSIS — E1110 Type 2 diabetes mellitus with ketoacidosis without coma: Secondary | ICD-10-CM | POA: Diagnosis not present

## 2020-09-10 DIAGNOSIS — I4510 Unspecified right bundle-branch block: Secondary | ICD-10-CM | POA: Diagnosis not present

## 2020-09-10 DIAGNOSIS — E119 Type 2 diabetes mellitus without complications: Secondary | ICD-10-CM | POA: Diagnosis not present

## 2020-09-10 DIAGNOSIS — E872 Acidosis: Secondary | ICD-10-CM | POA: Diagnosis not present

## 2020-09-10 DIAGNOSIS — E1122 Type 2 diabetes mellitus with diabetic chronic kidney disease: Secondary | ICD-10-CM | POA: Diagnosis not present

## 2020-09-10 DIAGNOSIS — N179 Acute kidney failure, unspecified: Secondary | ICD-10-CM | POA: Diagnosis not present

## 2020-09-10 DIAGNOSIS — G9340 Encephalopathy, unspecified: Secondary | ICD-10-CM | POA: Diagnosis not present

## 2020-09-10 DIAGNOSIS — D631 Anemia in chronic kidney disease: Secondary | ICD-10-CM | POA: Diagnosis not present

## 2020-09-10 DIAGNOSIS — Z794 Long term (current) use of insulin: Secondary | ICD-10-CM | POA: Diagnosis not present

## 2020-09-10 DIAGNOSIS — R4182 Altered mental status, unspecified: Secondary | ICD-10-CM | POA: Diagnosis not present

## 2020-09-10 DIAGNOSIS — N183 Chronic kidney disease, stage 3 unspecified: Secondary | ICD-10-CM | POA: Diagnosis not present

## 2020-09-10 DIAGNOSIS — F319 Bipolar disorder, unspecified: Secondary | ICD-10-CM | POA: Diagnosis not present

## 2020-09-11 DIAGNOSIS — G9340 Encephalopathy, unspecified: Secondary | ICD-10-CM | POA: Diagnosis not present

## 2020-09-11 DIAGNOSIS — E1110 Type 2 diabetes mellitus with ketoacidosis without coma: Secondary | ICD-10-CM | POA: Diagnosis not present

## 2020-09-11 DIAGNOSIS — F319 Bipolar disorder, unspecified: Secondary | ICD-10-CM | POA: Diagnosis not present

## 2020-09-11 DIAGNOSIS — E872 Acidosis: Secondary | ICD-10-CM | POA: Diagnosis not present

## 2020-09-11 DIAGNOSIS — N183 Chronic kidney disease, stage 3 unspecified: Secondary | ICD-10-CM | POA: Diagnosis not present

## 2020-09-11 DIAGNOSIS — N179 Acute kidney failure, unspecified: Secondary | ICD-10-CM | POA: Diagnosis not present

## 2020-09-11 DIAGNOSIS — E119 Type 2 diabetes mellitus without complications: Secondary | ICD-10-CM | POA: Diagnosis not present

## 2020-09-11 DIAGNOSIS — Z794 Long term (current) use of insulin: Secondary | ICD-10-CM | POA: Diagnosis not present

## 2020-09-11 DIAGNOSIS — D631 Anemia in chronic kidney disease: Secondary | ICD-10-CM | POA: Diagnosis not present

## 2020-09-11 DIAGNOSIS — E1122 Type 2 diabetes mellitus with diabetic chronic kidney disease: Secondary | ICD-10-CM | POA: Diagnosis not present

## 2020-09-11 DIAGNOSIS — R4182 Altered mental status, unspecified: Secondary | ICD-10-CM | POA: Diagnosis not present

## 2020-09-12 DIAGNOSIS — W19XXXA Unspecified fall, initial encounter: Secondary | ICD-10-CM | POA: Diagnosis not present

## 2020-09-12 DIAGNOSIS — R4182 Altered mental status, unspecified: Secondary | ICD-10-CM | POA: Diagnosis not present

## 2020-09-12 DIAGNOSIS — N179 Acute kidney failure, unspecified: Secondary | ICD-10-CM | POA: Diagnosis not present

## 2020-09-12 DIAGNOSIS — N183 Chronic kidney disease, stage 3 unspecified: Secondary | ICD-10-CM | POA: Diagnosis not present

## 2020-09-12 DIAGNOSIS — E119 Type 2 diabetes mellitus without complications: Secondary | ICD-10-CM | POA: Diagnosis not present

## 2020-09-12 DIAGNOSIS — F319 Bipolar disorder, unspecified: Secondary | ICD-10-CM | POA: Diagnosis not present

## 2020-09-12 DIAGNOSIS — S022XXA Fracture of nasal bones, initial encounter for closed fracture: Secondary | ICD-10-CM | POA: Diagnosis not present

## 2020-09-12 DIAGNOSIS — Z794 Long term (current) use of insulin: Secondary | ICD-10-CM | POA: Diagnosis not present

## 2020-09-12 DIAGNOSIS — E1122 Type 2 diabetes mellitus with diabetic chronic kidney disease: Secondary | ICD-10-CM | POA: Diagnosis not present

## 2020-09-12 DIAGNOSIS — G9340 Encephalopathy, unspecified: Secondary | ICD-10-CM | POA: Diagnosis not present

## 2020-09-12 DIAGNOSIS — E872 Acidosis: Secondary | ICD-10-CM | POA: Diagnosis not present

## 2020-09-12 DIAGNOSIS — D631 Anemia in chronic kidney disease: Secondary | ICD-10-CM | POA: Diagnosis not present

## 2020-09-12 DIAGNOSIS — E1110 Type 2 diabetes mellitus with ketoacidosis without coma: Secondary | ICD-10-CM | POA: Diagnosis not present

## 2020-09-16 DIAGNOSIS — M6281 Muscle weakness (generalized): Secondary | ICD-10-CM | POA: Diagnosis not present

## 2020-09-16 DIAGNOSIS — I129 Hypertensive chronic kidney disease with stage 1 through stage 4 chronic kidney disease, or unspecified chronic kidney disease: Secondary | ICD-10-CM | POA: Diagnosis not present

## 2020-09-16 DIAGNOSIS — E1121 Type 2 diabetes mellitus with diabetic nephropathy: Secondary | ICD-10-CM | POA: Diagnosis not present

## 2020-09-16 DIAGNOSIS — Z6831 Body mass index (BMI) 31.0-31.9, adult: Secondary | ICD-10-CM | POA: Diagnosis not present

## 2020-09-16 DIAGNOSIS — R41841 Cognitive communication deficit: Secondary | ICD-10-CM | POA: Diagnosis not present

## 2020-09-16 DIAGNOSIS — E8881 Metabolic syndrome: Secondary | ICD-10-CM | POA: Diagnosis not present

## 2020-09-16 DIAGNOSIS — E118 Type 2 diabetes mellitus with unspecified complications: Secondary | ICD-10-CM | POA: Diagnosis not present

## 2020-09-16 DIAGNOSIS — E1165 Type 2 diabetes mellitus with hyperglycemia: Secondary | ICD-10-CM | POA: Diagnosis not present

## 2020-09-16 DIAGNOSIS — Z87440 Personal history of urinary (tract) infections: Secondary | ICD-10-CM | POA: Diagnosis not present

## 2020-09-16 DIAGNOSIS — Z794 Long term (current) use of insulin: Secondary | ICD-10-CM | POA: Diagnosis not present

## 2020-09-16 DIAGNOSIS — S88119A Complete traumatic amputation at level between knee and ankle, unspecified lower leg, initial encounter: Secondary | ICD-10-CM | POA: Diagnosis not present

## 2020-09-16 DIAGNOSIS — Z20822 Contact with and (suspected) exposure to covid-19: Secondary | ICD-10-CM | POA: Diagnosis not present

## 2020-09-16 DIAGNOSIS — R42 Dizziness and giddiness: Secondary | ICD-10-CM | POA: Diagnosis not present

## 2020-09-16 DIAGNOSIS — L97509 Non-pressure chronic ulcer of other part of unspecified foot with unspecified severity: Secondary | ICD-10-CM | POA: Diagnosis not present

## 2020-09-16 DIAGNOSIS — Z79899 Other long term (current) drug therapy: Secondary | ICD-10-CM | POA: Diagnosis not present

## 2020-09-16 DIAGNOSIS — Z6832 Body mass index (BMI) 32.0-32.9, adult: Secondary | ICD-10-CM | POA: Diagnosis not present

## 2020-09-16 DIAGNOSIS — N189 Chronic kidney disease, unspecified: Secondary | ICD-10-CM | POA: Diagnosis not present

## 2020-09-16 DIAGNOSIS — Z6830 Body mass index (BMI) 30.0-30.9, adult: Secondary | ICD-10-CM | POA: Diagnosis not present

## 2020-09-16 DIAGNOSIS — R197 Diarrhea, unspecified: Secondary | ICD-10-CM | POA: Diagnosis not present

## 2020-09-16 DIAGNOSIS — R2681 Unsteadiness on feet: Secondary | ICD-10-CM | POA: Diagnosis not present

## 2020-09-16 DIAGNOSIS — E669 Obesity, unspecified: Secondary | ICD-10-CM | POA: Diagnosis not present

## 2020-09-16 DIAGNOSIS — Z833 Family history of diabetes mellitus: Secondary | ICD-10-CM | POA: Diagnosis not present

## 2020-09-16 DIAGNOSIS — Z1612 Extended spectrum beta lactamase (ESBL) resistance: Secondary | ICD-10-CM | POA: Diagnosis not present

## 2020-09-16 DIAGNOSIS — R1111 Vomiting without nausea: Secondary | ICD-10-CM | POA: Diagnosis not present

## 2020-09-16 DIAGNOSIS — E1122 Type 2 diabetes mellitus with diabetic chronic kidney disease: Secondary | ICD-10-CM | POA: Diagnosis not present

## 2020-09-16 DIAGNOSIS — E11622 Type 2 diabetes mellitus with other skin ulcer: Secondary | ICD-10-CM | POA: Diagnosis not present

## 2020-09-16 DIAGNOSIS — E11621 Type 2 diabetes mellitus with foot ulcer: Secondary | ICD-10-CM | POA: Diagnosis not present

## 2020-09-16 DIAGNOSIS — R159 Full incontinence of feces: Secondary | ICD-10-CM | POA: Diagnosis not present

## 2020-09-16 DIAGNOSIS — G9340 Encephalopathy, unspecified: Secondary | ICD-10-CM | POA: Diagnosis not present

## 2020-09-16 DIAGNOSIS — L89151 Pressure ulcer of sacral region, stage 1: Secondary | ICD-10-CM | POA: Diagnosis not present

## 2020-09-16 DIAGNOSIS — E1129 Type 2 diabetes mellitus with other diabetic kidney complication: Secondary | ICD-10-CM | POA: Diagnosis not present

## 2020-09-16 DIAGNOSIS — E1142 Type 2 diabetes mellitus with diabetic polyneuropathy: Secondary | ICD-10-CM | POA: Diagnosis not present

## 2020-09-16 DIAGNOSIS — Z7982 Long term (current) use of aspirin: Secondary | ICD-10-CM | POA: Diagnosis not present

## 2020-09-16 DIAGNOSIS — N390 Urinary tract infection, site not specified: Secondary | ICD-10-CM | POA: Diagnosis not present

## 2020-09-16 DIAGNOSIS — Z89432 Acquired absence of left foot: Secondary | ICD-10-CM | POA: Diagnosis not present

## 2020-09-16 DIAGNOSIS — M7989 Other specified soft tissue disorders: Secondary | ICD-10-CM | POA: Diagnosis not present

## 2020-09-16 DIAGNOSIS — Z9119 Patient's noncompliance with other medical treatment and regimen: Secondary | ICD-10-CM | POA: Diagnosis not present

## 2020-09-16 DIAGNOSIS — R293 Abnormal posture: Secondary | ICD-10-CM | POA: Diagnosis not present

## 2020-09-16 DIAGNOSIS — S022XXD Fracture of nasal bones, subsequent encounter for fracture with routine healing: Secondary | ICD-10-CM | POA: Diagnosis not present

## 2020-09-16 DIAGNOSIS — Z741 Need for assistance with personal care: Secondary | ICD-10-CM | POA: Diagnosis not present

## 2020-09-16 DIAGNOSIS — N183 Chronic kidney disease, stage 3 unspecified: Secondary | ICD-10-CM | POA: Diagnosis not present

## 2020-09-16 DIAGNOSIS — E1169 Type 2 diabetes mellitus with other specified complication: Secondary | ICD-10-CM | POA: Diagnosis not present

## 2020-09-16 DIAGNOSIS — B961 Klebsiella pneumoniae [K. pneumoniae] as the cause of diseases classified elsewhere: Secondary | ICD-10-CM | POA: Diagnosis not present

## 2020-09-16 DIAGNOSIS — I452 Bifascicular block: Secondary | ICD-10-CM | POA: Diagnosis not present

## 2020-09-16 DIAGNOSIS — I10 Essential (primary) hypertension: Secondary | ICD-10-CM | POA: Diagnosis not present

## 2020-09-16 DIAGNOSIS — L97529 Non-pressure chronic ulcer of other part of left foot with unspecified severity: Secondary | ICD-10-CM | POA: Diagnosis not present

## 2020-09-16 DIAGNOSIS — Z89511 Acquired absence of right leg below knee: Secondary | ICD-10-CM | POA: Diagnosis not present

## 2020-09-16 DIAGNOSIS — Z9181 History of falling: Secondary | ICD-10-CM | POA: Diagnosis not present

## 2020-09-16 DIAGNOSIS — F319 Bipolar disorder, unspecified: Secondary | ICD-10-CM | POA: Diagnosis not present

## 2020-09-16 DIAGNOSIS — N179 Acute kidney failure, unspecified: Secondary | ICD-10-CM | POA: Diagnosis not present

## 2020-09-16 DIAGNOSIS — S0285XD Fracture of orbit, unspecified, subsequent encounter for fracture with routine healing: Secondary | ICD-10-CM | POA: Diagnosis not present

## 2020-09-16 DIAGNOSIS — M6702 Short Achilles tendon (acquired), left ankle: Secondary | ICD-10-CM | POA: Diagnosis not present

## 2020-09-17 DIAGNOSIS — F319 Bipolar disorder, unspecified: Secondary | ICD-10-CM | POA: Diagnosis not present

## 2020-09-17 DIAGNOSIS — E1165 Type 2 diabetes mellitus with hyperglycemia: Secondary | ICD-10-CM | POA: Diagnosis not present

## 2020-09-17 DIAGNOSIS — N179 Acute kidney failure, unspecified: Secondary | ICD-10-CM | POA: Diagnosis not present

## 2020-09-27 DIAGNOSIS — H579 Unspecified disorder of eye and adnexa: Secondary | ICD-10-CM | POA: Diagnosis not present

## 2020-09-27 DIAGNOSIS — N179 Acute kidney failure, unspecified: Secondary | ICD-10-CM | POA: Diagnosis not present

## 2020-09-27 DIAGNOSIS — R293 Abnormal posture: Secondary | ICD-10-CM | POA: Diagnosis not present

## 2020-09-27 DIAGNOSIS — Z9181 History of falling: Secondary | ICD-10-CM | POA: Diagnosis not present

## 2020-09-27 DIAGNOSIS — F411 Generalized anxiety disorder: Secondary | ICD-10-CM | POA: Diagnosis not present

## 2020-09-27 DIAGNOSIS — D649 Anemia, unspecified: Secondary | ICD-10-CM | POA: Diagnosis not present

## 2020-09-27 DIAGNOSIS — R112 Nausea with vomiting, unspecified: Secondary | ICD-10-CM | POA: Diagnosis not present

## 2020-09-27 DIAGNOSIS — L97429 Non-pressure chronic ulcer of left heel and midfoot with unspecified severity: Secondary | ICD-10-CM | POA: Diagnosis not present

## 2020-09-27 DIAGNOSIS — M6702 Short Achilles tendon (acquired), left ankle: Secondary | ICD-10-CM | POA: Diagnosis not present

## 2020-09-27 DIAGNOSIS — I10 Essential (primary) hypertension: Secondary | ICD-10-CM | POA: Diagnosis not present

## 2020-09-27 DIAGNOSIS — E11622 Type 2 diabetes mellitus with other skin ulcer: Secondary | ICD-10-CM | POA: Diagnosis not present

## 2020-09-27 DIAGNOSIS — Z794 Long term (current) use of insulin: Secondary | ICD-10-CM | POA: Diagnosis not present

## 2020-09-27 DIAGNOSIS — G9340 Encephalopathy, unspecified: Secondary | ICD-10-CM | POA: Diagnosis not present

## 2020-09-27 DIAGNOSIS — I739 Peripheral vascular disease, unspecified: Secondary | ICD-10-CM | POA: Diagnosis not present

## 2020-09-27 DIAGNOSIS — L8990 Pressure ulcer of unspecified site, unspecified stage: Secondary | ICD-10-CM | POA: Diagnosis not present

## 2020-09-27 DIAGNOSIS — Z741 Need for assistance with personal care: Secondary | ICD-10-CM | POA: Diagnosis not present

## 2020-09-27 DIAGNOSIS — N183 Chronic kidney disease, stage 3 unspecified: Secondary | ICD-10-CM | POA: Diagnosis not present

## 2020-09-27 DIAGNOSIS — Z89511 Acquired absence of right leg below knee: Secondary | ICD-10-CM | POA: Diagnosis not present

## 2020-09-27 DIAGNOSIS — R41841 Cognitive communication deficit: Secondary | ICD-10-CM | POA: Diagnosis not present

## 2020-09-27 DIAGNOSIS — N390 Urinary tract infection, site not specified: Secondary | ICD-10-CM | POA: Diagnosis not present

## 2020-09-27 DIAGNOSIS — R2681 Unsteadiness on feet: Secondary | ICD-10-CM | POA: Diagnosis not present

## 2020-09-27 DIAGNOSIS — Z89432 Acquired absence of left foot: Secondary | ICD-10-CM | POA: Diagnosis not present

## 2020-09-27 DIAGNOSIS — G629 Polyneuropathy, unspecified: Secondary | ICD-10-CM | POA: Diagnosis not present

## 2020-09-27 DIAGNOSIS — S022XXD Fracture of nasal bones, subsequent encounter for fracture with routine healing: Secondary | ICD-10-CM | POA: Diagnosis not present

## 2020-09-27 DIAGNOSIS — S0285XD Fracture of orbit, unspecified, subsequent encounter for fracture with routine healing: Secondary | ICD-10-CM | POA: Diagnosis not present

## 2020-09-27 DIAGNOSIS — E785 Hyperlipidemia, unspecified: Secondary | ICD-10-CM | POA: Diagnosis not present

## 2020-09-27 DIAGNOSIS — S88119A Complete traumatic amputation at level between knee and ankle, unspecified lower leg, initial encounter: Secondary | ICD-10-CM | POA: Diagnosis not present

## 2020-09-27 DIAGNOSIS — F319 Bipolar disorder, unspecified: Secondary | ICD-10-CM | POA: Diagnosis not present

## 2020-09-27 DIAGNOSIS — M6281 Muscle weakness (generalized): Secondary | ICD-10-CM | POA: Diagnosis not present

## 2020-09-27 DIAGNOSIS — E1165 Type 2 diabetes mellitus with hyperglycemia: Secondary | ICD-10-CM | POA: Diagnosis not present

## 2020-09-27 DIAGNOSIS — E119 Type 2 diabetes mellitus without complications: Secondary | ICD-10-CM | POA: Diagnosis not present

## 2020-09-27 DIAGNOSIS — L97529 Non-pressure chronic ulcer of other part of left foot with unspecified severity: Secondary | ICD-10-CM | POA: Diagnosis not present

## 2020-09-27 DIAGNOSIS — R159 Full incontinence of feces: Secondary | ICD-10-CM | POA: Diagnosis not present

## 2020-09-27 DIAGNOSIS — G2581 Restless legs syndrome: Secondary | ICD-10-CM | POA: Diagnosis not present

## 2020-09-27 DIAGNOSIS — G4733 Obstructive sleep apnea (adult) (pediatric): Secondary | ICD-10-CM | POA: Diagnosis not present

## 2020-09-27 DIAGNOSIS — R110 Nausea: Secondary | ICD-10-CM | POA: Diagnosis not present

## 2020-09-28 DIAGNOSIS — N390 Urinary tract infection, site not specified: Secondary | ICD-10-CM | POA: Diagnosis not present

## 2020-09-28 DIAGNOSIS — E785 Hyperlipidemia, unspecified: Secondary | ICD-10-CM | POA: Diagnosis not present

## 2020-09-28 DIAGNOSIS — G629 Polyneuropathy, unspecified: Secondary | ICD-10-CM | POA: Diagnosis not present

## 2020-09-28 DIAGNOSIS — N183 Chronic kidney disease, stage 3 unspecified: Secondary | ICD-10-CM | POA: Diagnosis not present

## 2020-09-28 DIAGNOSIS — I10 Essential (primary) hypertension: Secondary | ICD-10-CM | POA: Diagnosis not present

## 2020-09-28 DIAGNOSIS — D649 Anemia, unspecified: Secondary | ICD-10-CM | POA: Diagnosis not present

## 2020-09-28 DIAGNOSIS — R110 Nausea: Secondary | ICD-10-CM | POA: Diagnosis not present

## 2020-09-28 DIAGNOSIS — G4733 Obstructive sleep apnea (adult) (pediatric): Secondary | ICD-10-CM | POA: Diagnosis not present

## 2020-09-28 DIAGNOSIS — F319 Bipolar disorder, unspecified: Secondary | ICD-10-CM | POA: Diagnosis not present

## 2020-09-28 DIAGNOSIS — E119 Type 2 diabetes mellitus without complications: Secondary | ICD-10-CM | POA: Diagnosis not present

## 2020-09-28 DIAGNOSIS — R159 Full incontinence of feces: Secondary | ICD-10-CM | POA: Diagnosis not present

## 2020-09-28 DIAGNOSIS — I739 Peripheral vascular disease, unspecified: Secondary | ICD-10-CM | POA: Diagnosis not present

## 2020-09-28 DIAGNOSIS — L97429 Non-pressure chronic ulcer of left heel and midfoot with unspecified severity: Secondary | ICD-10-CM | POA: Diagnosis not present

## 2020-10-01 DIAGNOSIS — Z89511 Acquired absence of right leg below knee: Secondary | ICD-10-CM | POA: Diagnosis not present

## 2020-10-01 DIAGNOSIS — E119 Type 2 diabetes mellitus without complications: Secondary | ICD-10-CM | POA: Diagnosis not present

## 2020-10-01 DIAGNOSIS — I10 Essential (primary) hypertension: Secondary | ICD-10-CM | POA: Diagnosis not present

## 2020-10-01 DIAGNOSIS — F319 Bipolar disorder, unspecified: Secondary | ICD-10-CM | POA: Diagnosis not present

## 2020-10-01 DIAGNOSIS — G629 Polyneuropathy, unspecified: Secondary | ICD-10-CM | POA: Diagnosis not present

## 2020-10-03 DIAGNOSIS — R112 Nausea with vomiting, unspecified: Secondary | ICD-10-CM | POA: Diagnosis not present

## 2020-10-03 DIAGNOSIS — I739 Peripheral vascular disease, unspecified: Secondary | ICD-10-CM | POA: Diagnosis not present

## 2020-10-03 DIAGNOSIS — E1165 Type 2 diabetes mellitus with hyperglycemia: Secondary | ICD-10-CM | POA: Diagnosis not present

## 2020-10-03 DIAGNOSIS — G9340 Encephalopathy, unspecified: Secondary | ICD-10-CM | POA: Diagnosis not present

## 2020-10-03 DIAGNOSIS — L8990 Pressure ulcer of unspecified site, unspecified stage: Secondary | ICD-10-CM | POA: Diagnosis not present

## 2020-10-03 DIAGNOSIS — F319 Bipolar disorder, unspecified: Secondary | ICD-10-CM | POA: Diagnosis not present

## 2020-10-03 DIAGNOSIS — N179 Acute kidney failure, unspecified: Secondary | ICD-10-CM | POA: Diagnosis not present

## 2020-10-04 DIAGNOSIS — L97429 Non-pressure chronic ulcer of left heel and midfoot with unspecified severity: Secondary | ICD-10-CM | POA: Diagnosis not present

## 2020-10-05 DIAGNOSIS — G629 Polyneuropathy, unspecified: Secondary | ICD-10-CM | POA: Diagnosis not present

## 2020-10-05 DIAGNOSIS — Z89511 Acquired absence of right leg below knee: Secondary | ICD-10-CM | POA: Diagnosis not present

## 2020-10-05 DIAGNOSIS — H579 Unspecified disorder of eye and adnexa: Secondary | ICD-10-CM | POA: Diagnosis not present

## 2020-10-05 DIAGNOSIS — I10 Essential (primary) hypertension: Secondary | ICD-10-CM | POA: Diagnosis not present

## 2020-10-05 DIAGNOSIS — F319 Bipolar disorder, unspecified: Secondary | ICD-10-CM | POA: Diagnosis not present

## 2020-10-09 DIAGNOSIS — F411 Generalized anxiety disorder: Secondary | ICD-10-CM | POA: Diagnosis not present

## 2020-10-09 DIAGNOSIS — F319 Bipolar disorder, unspecified: Secondary | ICD-10-CM | POA: Diagnosis not present

## 2020-10-10 DIAGNOSIS — F319 Bipolar disorder, unspecified: Secondary | ICD-10-CM | POA: Diagnosis not present

## 2020-10-10 DIAGNOSIS — E119 Type 2 diabetes mellitus without complications: Secondary | ICD-10-CM | POA: Diagnosis not present

## 2020-10-10 DIAGNOSIS — Z89511 Acquired absence of right leg below knee: Secondary | ICD-10-CM | POA: Diagnosis not present

## 2020-10-10 DIAGNOSIS — I10 Essential (primary) hypertension: Secondary | ICD-10-CM | POA: Diagnosis not present

## 2020-10-10 DIAGNOSIS — G629 Polyneuropathy, unspecified: Secondary | ICD-10-CM | POA: Diagnosis not present

## 2020-10-10 DIAGNOSIS — I739 Peripheral vascular disease, unspecified: Secondary | ICD-10-CM | POA: Diagnosis not present

## 2020-10-11 DIAGNOSIS — L97429 Non-pressure chronic ulcer of left heel and midfoot with unspecified severity: Secondary | ICD-10-CM | POA: Diagnosis not present

## 2020-10-18 DIAGNOSIS — L97529 Non-pressure chronic ulcer of other part of left foot with unspecified severity: Secondary | ICD-10-CM | POA: Diagnosis not present

## 2020-10-23 DIAGNOSIS — I739 Peripheral vascular disease, unspecified: Secondary | ICD-10-CM | POA: Diagnosis not present

## 2020-10-23 DIAGNOSIS — I10 Essential (primary) hypertension: Secondary | ICD-10-CM | POA: Diagnosis not present

## 2020-10-23 DIAGNOSIS — F319 Bipolar disorder, unspecified: Secondary | ICD-10-CM | POA: Diagnosis not present

## 2020-10-23 DIAGNOSIS — F411 Generalized anxiety disorder: Secondary | ICD-10-CM | POA: Diagnosis not present

## 2020-10-23 DIAGNOSIS — G2581 Restless legs syndrome: Secondary | ICD-10-CM | POA: Diagnosis not present

## 2020-10-23 DIAGNOSIS — G629 Polyneuropathy, unspecified: Secondary | ICD-10-CM | POA: Diagnosis not present

## 2020-10-25 DIAGNOSIS — L97429 Non-pressure chronic ulcer of left heel and midfoot with unspecified severity: Secondary | ICD-10-CM | POA: Diagnosis not present

## 2020-11-06 DIAGNOSIS — F319 Bipolar disorder, unspecified: Secondary | ICD-10-CM | POA: Diagnosis not present

## 2020-11-13 DIAGNOSIS — F319 Bipolar disorder, unspecified: Secondary | ICD-10-CM | POA: Diagnosis not present

## 2020-11-13 DIAGNOSIS — G629 Polyneuropathy, unspecified: Secondary | ICD-10-CM | POA: Diagnosis not present

## 2020-11-13 DIAGNOSIS — U071 COVID-19: Secondary | ICD-10-CM | POA: Diagnosis not present

## 2020-11-13 DIAGNOSIS — Z89511 Acquired absence of right leg below knee: Secondary | ICD-10-CM | POA: Diagnosis not present

## 2020-11-15 DIAGNOSIS — F339 Major depressive disorder, recurrent, unspecified: Secondary | ICD-10-CM | POA: Diagnosis not present

## 2020-11-15 DIAGNOSIS — F4323 Adjustment disorder with mixed anxiety and depressed mood: Secondary | ICD-10-CM | POA: Diagnosis not present

## 2020-11-15 DIAGNOSIS — F419 Anxiety disorder, unspecified: Secondary | ICD-10-CM | POA: Diagnosis not present

## 2020-11-15 DIAGNOSIS — F319 Bipolar disorder, unspecified: Secondary | ICD-10-CM | POA: Diagnosis not present

## 2020-11-20 DIAGNOSIS — K219 Gastro-esophageal reflux disease without esophagitis: Secondary | ICD-10-CM | POA: Diagnosis not present

## 2020-11-20 DIAGNOSIS — I10 Essential (primary) hypertension: Secondary | ICD-10-CM | POA: Diagnosis not present

## 2020-11-20 DIAGNOSIS — E11621 Type 2 diabetes mellitus with foot ulcer: Secondary | ICD-10-CM | POA: Diagnosis not present

## 2020-11-26 DIAGNOSIS — F339 Major depressive disorder, recurrent, unspecified: Secondary | ICD-10-CM | POA: Diagnosis not present

## 2020-11-26 DIAGNOSIS — E113512 Type 2 diabetes mellitus with proliferative diabetic retinopathy with macular edema, left eye: Secondary | ICD-10-CM | POA: Diagnosis not present

## 2020-11-26 DIAGNOSIS — F419 Anxiety disorder, unspecified: Secondary | ICD-10-CM | POA: Diagnosis not present

## 2020-12-07 DIAGNOSIS — F419 Anxiety disorder, unspecified: Secondary | ICD-10-CM | POA: Diagnosis not present

## 2020-12-07 DIAGNOSIS — F319 Bipolar disorder, unspecified: Secondary | ICD-10-CM | POA: Diagnosis not present

## 2020-12-07 DIAGNOSIS — F4323 Adjustment disorder with mixed anxiety and depressed mood: Secondary | ICD-10-CM | POA: Diagnosis not present

## 2020-12-07 DIAGNOSIS — F339 Major depressive disorder, recurrent, unspecified: Secondary | ICD-10-CM | POA: Diagnosis not present

## 2020-12-08 DIAGNOSIS — N390 Urinary tract infection, site not specified: Secondary | ICD-10-CM | POA: Diagnosis not present

## 2020-12-10 DIAGNOSIS — E113511 Type 2 diabetes mellitus with proliferative diabetic retinopathy with macular edema, right eye: Secondary | ICD-10-CM | POA: Diagnosis not present

## 2020-12-11 DIAGNOSIS — F319 Bipolar disorder, unspecified: Secondary | ICD-10-CM | POA: Diagnosis not present

## 2020-12-11 DIAGNOSIS — I10 Essential (primary) hypertension: Secondary | ICD-10-CM | POA: Diagnosis not present

## 2020-12-11 DIAGNOSIS — E11621 Type 2 diabetes mellitus with foot ulcer: Secondary | ICD-10-CM | POA: Diagnosis not present

## 2020-12-11 DIAGNOSIS — R45851 Suicidal ideations: Secondary | ICD-10-CM | POA: Diagnosis not present

## 2020-12-11 DIAGNOSIS — F339 Major depressive disorder, recurrent, unspecified: Secondary | ICD-10-CM | POA: Diagnosis not present

## 2020-12-11 DIAGNOSIS — N3000 Acute cystitis without hematuria: Secondary | ICD-10-CM | POA: Diagnosis not present

## 2020-12-11 DIAGNOSIS — K219 Gastro-esophageal reflux disease without esophagitis: Secondary | ICD-10-CM | POA: Diagnosis not present

## 2020-12-11 DIAGNOSIS — F4323 Adjustment disorder with mixed anxiety and depressed mood: Secondary | ICD-10-CM | POA: Diagnosis not present

## 2020-12-11 DIAGNOSIS — F419 Anxiety disorder, unspecified: Secondary | ICD-10-CM | POA: Diagnosis not present

## 2020-12-25 ENCOUNTER — Encounter: Payer: Self-pay | Admitting: Hematology & Oncology

## 2020-12-28 DIAGNOSIS — Z79899 Other long term (current) drug therapy: Secondary | ICD-10-CM | POA: Diagnosis not present

## 2020-12-28 DIAGNOSIS — E559 Vitamin D deficiency, unspecified: Secondary | ICD-10-CM | POA: Diagnosis not present

## 2020-12-28 DIAGNOSIS — E7849 Other hyperlipidemia: Secondary | ICD-10-CM | POA: Diagnosis not present

## 2020-12-28 DIAGNOSIS — E119 Type 2 diabetes mellitus without complications: Secondary | ICD-10-CM | POA: Diagnosis not present

## 2020-12-28 DIAGNOSIS — D518 Other vitamin B12 deficiency anemias: Secondary | ICD-10-CM | POA: Diagnosis not present

## 2020-12-28 DIAGNOSIS — E038 Other specified hypothyroidism: Secondary | ICD-10-CM | POA: Diagnosis not present

## 2020-12-31 DIAGNOSIS — Z111 Encounter for screening for respiratory tuberculosis: Secondary | ICD-10-CM | POA: Diagnosis not present

## 2020-12-31 DIAGNOSIS — E113512 Type 2 diabetes mellitus with proliferative diabetic retinopathy with macular edema, left eye: Secondary | ICD-10-CM | POA: Diagnosis not present

## 2021-01-01 DIAGNOSIS — L239 Allergic contact dermatitis, unspecified cause: Secondary | ICD-10-CM | POA: Diagnosis not present

## 2021-01-01 DIAGNOSIS — R42 Dizziness and giddiness: Secondary | ICD-10-CM | POA: Diagnosis not present

## 2021-01-03 ENCOUNTER — Ambulatory Visit: Payer: PPO | Admitting: Podiatry

## 2021-01-03 DIAGNOSIS — Z5320 Procedure and treatment not carried out because of patient's decision for unspecified reasons: Secondary | ICD-10-CM | POA: Diagnosis not present

## 2021-01-04 DIAGNOSIS — F419 Anxiety disorder, unspecified: Secondary | ICD-10-CM | POA: Diagnosis not present

## 2021-01-04 DIAGNOSIS — F319 Bipolar disorder, unspecified: Secondary | ICD-10-CM | POA: Diagnosis not present

## 2021-01-07 DIAGNOSIS — N390 Urinary tract infection, site not specified: Secondary | ICD-10-CM | POA: Diagnosis not present

## 2021-01-08 DIAGNOSIS — F419 Anxiety disorder, unspecified: Secondary | ICD-10-CM | POA: Diagnosis not present

## 2021-01-08 DIAGNOSIS — F319 Bipolar disorder, unspecified: Secondary | ICD-10-CM | POA: Diagnosis not present

## 2021-01-08 DIAGNOSIS — F339 Major depressive disorder, recurrent, unspecified: Secondary | ICD-10-CM | POA: Diagnosis not present

## 2021-01-08 DIAGNOSIS — F4323 Adjustment disorder with mixed anxiety and depressed mood: Secondary | ICD-10-CM | POA: Diagnosis not present

## 2021-01-09 DIAGNOSIS — E038 Other specified hypothyroidism: Secondary | ICD-10-CM | POA: Diagnosis not present

## 2021-01-09 DIAGNOSIS — E559 Vitamin D deficiency, unspecified: Secondary | ICD-10-CM | POA: Diagnosis not present

## 2021-01-09 DIAGNOSIS — I10 Essential (primary) hypertension: Secondary | ICD-10-CM | POA: Diagnosis not present

## 2021-01-09 DIAGNOSIS — E7800 Pure hypercholesterolemia, unspecified: Secondary | ICD-10-CM | POA: Diagnosis not present

## 2021-01-09 DIAGNOSIS — D518 Other vitamin B12 deficiency anemias: Secondary | ICD-10-CM | POA: Diagnosis not present

## 2021-01-09 DIAGNOSIS — E11319 Type 2 diabetes mellitus with unspecified diabetic retinopathy without macular edema: Secondary | ICD-10-CM | POA: Diagnosis not present

## 2021-01-09 DIAGNOSIS — Z794 Long term (current) use of insulin: Secondary | ICD-10-CM | POA: Diagnosis not present

## 2021-01-09 DIAGNOSIS — Z89511 Acquired absence of right leg below knee: Secondary | ICD-10-CM | POA: Diagnosis not present

## 2021-01-09 DIAGNOSIS — E119 Type 2 diabetes mellitus without complications: Secondary | ICD-10-CM | POA: Diagnosis not present

## 2021-01-09 DIAGNOSIS — E11621 Type 2 diabetes mellitus with foot ulcer: Secondary | ICD-10-CM | POA: Diagnosis not present

## 2021-01-09 DIAGNOSIS — E1122 Type 2 diabetes mellitus with diabetic chronic kidney disease: Secondary | ICD-10-CM | POA: Diagnosis not present

## 2021-01-09 DIAGNOSIS — E1165 Type 2 diabetes mellitus with hyperglycemia: Secondary | ICD-10-CM | POA: Diagnosis not present

## 2021-01-10 ENCOUNTER — Encounter: Payer: Self-pay | Admitting: Hematology & Oncology

## 2021-01-15 DIAGNOSIS — F339 Major depressive disorder, recurrent, unspecified: Secondary | ICD-10-CM | POA: Diagnosis not present

## 2021-01-15 DIAGNOSIS — K219 Gastro-esophageal reflux disease without esophagitis: Secondary | ICD-10-CM | POA: Diagnosis not present

## 2021-01-15 DIAGNOSIS — E1122 Type 2 diabetes mellitus with diabetic chronic kidney disease: Secondary | ICD-10-CM | POA: Diagnosis not present

## 2021-01-15 DIAGNOSIS — F319 Bipolar disorder, unspecified: Secondary | ICD-10-CM | POA: Diagnosis not present

## 2021-01-15 DIAGNOSIS — F419 Anxiety disorder, unspecified: Secondary | ICD-10-CM | POA: Diagnosis not present

## 2021-01-15 DIAGNOSIS — F4323 Adjustment disorder with mixed anxiety and depressed mood: Secondary | ICD-10-CM | POA: Diagnosis not present

## 2021-01-15 DIAGNOSIS — E113511 Type 2 diabetes mellitus with proliferative diabetic retinopathy with macular edema, right eye: Secondary | ICD-10-CM | POA: Diagnosis not present

## 2021-01-15 DIAGNOSIS — N3000 Acute cystitis without hematuria: Secondary | ICD-10-CM | POA: Diagnosis not present

## 2021-01-15 DIAGNOSIS — I10 Essential (primary) hypertension: Secondary | ICD-10-CM | POA: Diagnosis not present

## 2021-01-16 ENCOUNTER — Other Ambulatory Visit: Payer: Self-pay | Admitting: *Deleted

## 2021-01-16 DIAGNOSIS — Z792 Long term (current) use of antibiotics: Secondary | ICD-10-CM | POA: Diagnosis not present

## 2021-01-16 DIAGNOSIS — Z79899 Other long term (current) drug therapy: Secondary | ICD-10-CM | POA: Diagnosis not present

## 2021-01-16 DIAGNOSIS — D631 Anemia in chronic kidney disease: Secondary | ICD-10-CM

## 2021-01-16 DIAGNOSIS — D508 Other iron deficiency anemias: Secondary | ICD-10-CM

## 2021-01-17 ENCOUNTER — Inpatient Hospital Stay: Payer: PPO | Attending: Hematology & Oncology

## 2021-01-17 ENCOUNTER — Inpatient Hospital Stay: Payer: PPO | Admitting: Hematology & Oncology

## 2021-01-17 DIAGNOSIS — Z792 Long term (current) use of antibiotics: Secondary | ICD-10-CM | POA: Diagnosis not present

## 2021-01-18 DIAGNOSIS — E1122 Type 2 diabetes mellitus with diabetic chronic kidney disease: Secondary | ICD-10-CM | POA: Diagnosis not present

## 2021-01-18 DIAGNOSIS — E038 Other specified hypothyroidism: Secondary | ICD-10-CM | POA: Diagnosis not present

## 2021-01-18 DIAGNOSIS — Z792 Long term (current) use of antibiotics: Secondary | ICD-10-CM | POA: Diagnosis not present

## 2021-01-18 DIAGNOSIS — E119 Type 2 diabetes mellitus without complications: Secondary | ICD-10-CM | POA: Diagnosis not present

## 2021-01-18 DIAGNOSIS — I10 Essential (primary) hypertension: Secondary | ICD-10-CM | POA: Diagnosis not present

## 2021-01-18 DIAGNOSIS — E11621 Type 2 diabetes mellitus with foot ulcer: Secondary | ICD-10-CM | POA: Diagnosis not present

## 2021-01-18 DIAGNOSIS — D518 Other vitamin B12 deficiency anemias: Secondary | ICD-10-CM | POA: Diagnosis not present

## 2021-01-18 DIAGNOSIS — E559 Vitamin D deficiency, unspecified: Secondary | ICD-10-CM | POA: Diagnosis not present

## 2021-01-20 DIAGNOSIS — N390 Urinary tract infection, site not specified: Secondary | ICD-10-CM | POA: Diagnosis not present

## 2021-01-28 DIAGNOSIS — R4589 Other symptoms and signs involving emotional state: Secondary | ICD-10-CM | POA: Diagnosis not present

## 2021-01-29 DIAGNOSIS — R296 Repeated falls: Secondary | ICD-10-CM | POA: Diagnosis not present

## 2021-01-29 DIAGNOSIS — E1151 Type 2 diabetes mellitus with diabetic peripheral angiopathy without gangrene: Secondary | ICD-10-CM | POA: Diagnosis not present

## 2021-01-29 DIAGNOSIS — N183 Chronic kidney disease, stage 3 unspecified: Secondary | ICD-10-CM | POA: Diagnosis not present

## 2021-01-29 DIAGNOSIS — I739 Peripheral vascular disease, unspecified: Secondary | ICD-10-CM | POA: Diagnosis not present

## 2021-01-29 DIAGNOSIS — Z79899 Other long term (current) drug therapy: Secondary | ICD-10-CM | POA: Diagnosis not present

## 2021-01-29 DIAGNOSIS — Z7982 Long term (current) use of aspirin: Secondary | ICD-10-CM | POA: Diagnosis not present

## 2021-01-29 DIAGNOSIS — Z1330 Encounter for screening examination for mental health and behavioral disorders, unspecified: Secondary | ICD-10-CM | POA: Diagnosis not present

## 2021-01-29 DIAGNOSIS — F319 Bipolar disorder, unspecified: Secondary | ICD-10-CM | POA: Diagnosis not present

## 2021-01-29 DIAGNOSIS — F330 Major depressive disorder, recurrent, mild: Secondary | ICD-10-CM | POA: Diagnosis not present

## 2021-01-29 DIAGNOSIS — W19XXXD Unspecified fall, subsequent encounter: Secondary | ICD-10-CM | POA: Diagnosis not present

## 2021-01-29 DIAGNOSIS — I452 Bifascicular block: Secondary | ICD-10-CM | POA: Diagnosis not present

## 2021-01-29 DIAGNOSIS — Z880 Allergy status to penicillin: Secondary | ICD-10-CM | POA: Diagnosis not present

## 2021-01-29 DIAGNOSIS — Z23 Encounter for immunization: Secondary | ICD-10-CM | POA: Diagnosis not present

## 2021-01-29 DIAGNOSIS — Z883 Allergy status to other anti-infective agents status: Secondary | ICD-10-CM | POA: Diagnosis not present

## 2021-01-29 DIAGNOSIS — E11649 Type 2 diabetes mellitus with hypoglycemia without coma: Secondary | ICD-10-CM | POA: Diagnosis not present

## 2021-01-29 DIAGNOSIS — Z20822 Contact with and (suspected) exposure to covid-19: Secondary | ICD-10-CM | POA: Diagnosis not present

## 2021-01-29 DIAGNOSIS — F331 Major depressive disorder, recurrent, moderate: Secondary | ICD-10-CM | POA: Diagnosis not present

## 2021-01-29 DIAGNOSIS — R7989 Other specified abnormal findings of blood chemistry: Secondary | ICD-10-CM | POA: Diagnosis not present

## 2021-01-29 DIAGNOSIS — R600 Localized edema: Secondary | ICD-10-CM | POA: Diagnosis not present

## 2021-01-29 DIAGNOSIS — Z885 Allergy status to narcotic agent status: Secondary | ICD-10-CM | POA: Diagnosis not present

## 2021-01-29 DIAGNOSIS — Z881 Allergy status to other antibiotic agents status: Secondary | ICD-10-CM | POA: Diagnosis not present

## 2021-01-29 DIAGNOSIS — Z794 Long term (current) use of insulin: Secondary | ICD-10-CM | POA: Diagnosis not present

## 2021-01-29 DIAGNOSIS — K219 Gastro-esophageal reflux disease without esophagitis: Secondary | ICD-10-CM | POA: Diagnosis not present

## 2021-01-29 DIAGNOSIS — Z6835 Body mass index (BMI) 35.0-35.9, adult: Secondary | ICD-10-CM | POA: Diagnosis not present

## 2021-01-29 DIAGNOSIS — R059 Cough, unspecified: Secondary | ICD-10-CM | POA: Diagnosis not present

## 2021-01-29 DIAGNOSIS — F411 Generalized anxiety disorder: Secondary | ICD-10-CM | POA: Diagnosis not present

## 2021-01-29 DIAGNOSIS — Y92239 Unspecified place in hospital as the place of occurrence of the external cause: Secondary | ICD-10-CM | POA: Diagnosis not present

## 2021-01-29 DIAGNOSIS — E11621 Type 2 diabetes mellitus with foot ulcer: Secondary | ICD-10-CM | POA: Diagnosis not present

## 2021-01-29 DIAGNOSIS — E669 Obesity, unspecified: Secondary | ICD-10-CM | POA: Diagnosis not present

## 2021-01-29 DIAGNOSIS — Z87892 Personal history of anaphylaxis: Secondary | ICD-10-CM | POA: Diagnosis not present

## 2021-01-29 DIAGNOSIS — Z89511 Acquired absence of right leg below knee: Secondary | ICD-10-CM | POA: Diagnosis not present

## 2021-01-29 DIAGNOSIS — I152 Hypertension secondary to endocrine disorders: Secondary | ICD-10-CM | POA: Diagnosis not present

## 2021-01-29 DIAGNOSIS — R45850 Homicidal ideations: Secondary | ICD-10-CM | POA: Diagnosis not present

## 2021-01-29 DIAGNOSIS — E785 Hyperlipidemia, unspecified: Secondary | ICD-10-CM | POA: Diagnosis not present

## 2021-01-29 DIAGNOSIS — J9809 Other diseases of bronchus, not elsewhere classified: Secondary | ICD-10-CM | POA: Diagnosis not present

## 2021-01-29 DIAGNOSIS — R4689 Other symptoms and signs involving appearance and behavior: Secondary | ICD-10-CM | POA: Diagnosis not present

## 2021-01-29 DIAGNOSIS — E1169 Type 2 diabetes mellitus with other specified complication: Secondary | ICD-10-CM | POA: Diagnosis not present

## 2021-01-29 DIAGNOSIS — L03011 Cellulitis of right finger: Secondary | ICD-10-CM | POA: Diagnosis not present

## 2021-01-29 DIAGNOSIS — I129 Hypertensive chronic kidney disease with stage 1 through stage 4 chronic kidney disease, or unspecified chronic kidney disease: Secondary | ICD-10-CM | POA: Diagnosis not present

## 2021-01-29 DIAGNOSIS — R918 Other nonspecific abnormal finding of lung field: Secondary | ICD-10-CM | POA: Diagnosis not present

## 2021-01-29 DIAGNOSIS — E1159 Type 2 diabetes mellitus with other circulatory complications: Secondary | ICD-10-CM | POA: Diagnosis not present

## 2021-01-29 DIAGNOSIS — F339 Major depressive disorder, recurrent, unspecified: Secondary | ICD-10-CM | POA: Diagnosis not present

## 2021-01-29 DIAGNOSIS — I4510 Unspecified right bundle-branch block: Secondary | ICD-10-CM | POA: Diagnosis not present

## 2021-01-29 DIAGNOSIS — E1122 Type 2 diabetes mellitus with diabetic chronic kidney disease: Secondary | ICD-10-CM | POA: Diagnosis not present

## 2021-01-29 DIAGNOSIS — Z91040 Latex allergy status: Secondary | ICD-10-CM | POA: Diagnosis not present

## 2021-01-31 DIAGNOSIS — E785 Hyperlipidemia, unspecified: Secondary | ICD-10-CM | POA: Diagnosis not present

## 2021-01-31 DIAGNOSIS — I739 Peripheral vascular disease, unspecified: Secondary | ICD-10-CM | POA: Diagnosis not present

## 2021-01-31 DIAGNOSIS — I152 Hypertension secondary to endocrine disorders: Secondary | ICD-10-CM | POA: Diagnosis not present

## 2021-01-31 DIAGNOSIS — E1169 Type 2 diabetes mellitus with other specified complication: Secondary | ICD-10-CM | POA: Diagnosis not present

## 2021-01-31 DIAGNOSIS — Z794 Long term (current) use of insulin: Secondary | ICD-10-CM | POA: Diagnosis not present

## 2021-01-31 DIAGNOSIS — E1159 Type 2 diabetes mellitus with other circulatory complications: Secondary | ICD-10-CM | POA: Diagnosis not present

## 2021-01-31 DIAGNOSIS — Z89511 Acquired absence of right leg below knee: Secondary | ICD-10-CM | POA: Diagnosis not present

## 2021-01-31 DIAGNOSIS — E1122 Type 2 diabetes mellitus with diabetic chronic kidney disease: Secondary | ICD-10-CM | POA: Diagnosis not present

## 2021-01-31 DIAGNOSIS — N183 Chronic kidney disease, stage 3 unspecified: Secondary | ICD-10-CM | POA: Diagnosis not present

## 2021-01-31 DIAGNOSIS — F331 Major depressive disorder, recurrent, moderate: Secondary | ICD-10-CM | POA: Diagnosis not present

## 2021-01-31 DIAGNOSIS — K219 Gastro-esophageal reflux disease without esophagitis: Secondary | ICD-10-CM | POA: Diagnosis not present

## 2021-01-31 DIAGNOSIS — F330 Major depressive disorder, recurrent, mild: Secondary | ICD-10-CM | POA: Diagnosis not present

## 2021-02-01 DIAGNOSIS — F331 Major depressive disorder, recurrent, moderate: Secondary | ICD-10-CM | POA: Diagnosis not present

## 2021-02-02 DIAGNOSIS — E1159 Type 2 diabetes mellitus with other circulatory complications: Secondary | ICD-10-CM | POA: Diagnosis not present

## 2021-02-02 DIAGNOSIS — I739 Peripheral vascular disease, unspecified: Secondary | ICD-10-CM | POA: Diagnosis not present

## 2021-02-02 DIAGNOSIS — N183 Chronic kidney disease, stage 3 unspecified: Secondary | ICD-10-CM | POA: Diagnosis not present

## 2021-02-02 DIAGNOSIS — Z794 Long term (current) use of insulin: Secondary | ICD-10-CM | POA: Diagnosis not present

## 2021-02-02 DIAGNOSIS — F331 Major depressive disorder, recurrent, moderate: Secondary | ICD-10-CM | POA: Diagnosis not present

## 2021-02-02 DIAGNOSIS — F411 Generalized anxiety disorder: Secondary | ICD-10-CM | POA: Diagnosis not present

## 2021-02-02 DIAGNOSIS — Z89511 Acquired absence of right leg below knee: Secondary | ICD-10-CM | POA: Diagnosis not present

## 2021-02-02 DIAGNOSIS — E1122 Type 2 diabetes mellitus with diabetic chronic kidney disease: Secondary | ICD-10-CM | POA: Diagnosis not present

## 2021-02-03 DIAGNOSIS — F331 Major depressive disorder, recurrent, moderate: Secondary | ICD-10-CM | POA: Diagnosis not present

## 2021-02-04 DIAGNOSIS — E1122 Type 2 diabetes mellitus with diabetic chronic kidney disease: Secondary | ICD-10-CM | POA: Diagnosis not present

## 2021-02-04 DIAGNOSIS — K219 Gastro-esophageal reflux disease without esophagitis: Secondary | ICD-10-CM | POA: Diagnosis not present

## 2021-02-04 DIAGNOSIS — E1159 Type 2 diabetes mellitus with other circulatory complications: Secondary | ICD-10-CM | POA: Diagnosis not present

## 2021-02-04 DIAGNOSIS — Z89511 Acquired absence of right leg below knee: Secondary | ICD-10-CM | POA: Diagnosis not present

## 2021-02-04 DIAGNOSIS — E785 Hyperlipidemia, unspecified: Secondary | ICD-10-CM | POA: Diagnosis not present

## 2021-02-04 DIAGNOSIS — Z794 Long term (current) use of insulin: Secondary | ICD-10-CM | POA: Diagnosis not present

## 2021-02-04 DIAGNOSIS — I152 Hypertension secondary to endocrine disorders: Secondary | ICD-10-CM | POA: Diagnosis not present

## 2021-02-04 DIAGNOSIS — E1169 Type 2 diabetes mellitus with other specified complication: Secondary | ICD-10-CM | POA: Diagnosis not present

## 2021-02-04 DIAGNOSIS — N183 Chronic kidney disease, stage 3 unspecified: Secondary | ICD-10-CM | POA: Diagnosis not present

## 2021-02-05 DIAGNOSIS — F331 Major depressive disorder, recurrent, moderate: Secondary | ICD-10-CM | POA: Diagnosis not present

## 2021-02-06 ENCOUNTER — Ambulatory Visit: Payer: PPO | Admitting: Sports Medicine

## 2021-02-06 DIAGNOSIS — E1122 Type 2 diabetes mellitus with diabetic chronic kidney disease: Secondary | ICD-10-CM | POA: Diagnosis not present

## 2021-02-06 DIAGNOSIS — N183 Chronic kidney disease, stage 3 unspecified: Secondary | ICD-10-CM | POA: Diagnosis not present

## 2021-02-06 DIAGNOSIS — E785 Hyperlipidemia, unspecified: Secondary | ICD-10-CM | POA: Diagnosis not present

## 2021-02-06 DIAGNOSIS — I152 Hypertension secondary to endocrine disorders: Secondary | ICD-10-CM | POA: Diagnosis not present

## 2021-02-06 DIAGNOSIS — Z89511 Acquired absence of right leg below knee: Secondary | ICD-10-CM | POA: Diagnosis not present

## 2021-02-06 DIAGNOSIS — E1169 Type 2 diabetes mellitus with other specified complication: Secondary | ICD-10-CM | POA: Diagnosis not present

## 2021-02-06 DIAGNOSIS — K219 Gastro-esophageal reflux disease without esophagitis: Secondary | ICD-10-CM | POA: Diagnosis not present

## 2021-02-06 DIAGNOSIS — E1159 Type 2 diabetes mellitus with other circulatory complications: Secondary | ICD-10-CM | POA: Diagnosis not present

## 2021-02-06 DIAGNOSIS — F331 Major depressive disorder, recurrent, moderate: Secondary | ICD-10-CM | POA: Diagnosis not present

## 2021-02-06 DIAGNOSIS — Z794 Long term (current) use of insulin: Secondary | ICD-10-CM | POA: Diagnosis not present

## 2021-02-07 DIAGNOSIS — F331 Major depressive disorder, recurrent, moderate: Secondary | ICD-10-CM | POA: Diagnosis not present

## 2021-02-08 DIAGNOSIS — E1159 Type 2 diabetes mellitus with other circulatory complications: Secondary | ICD-10-CM | POA: Diagnosis not present

## 2021-02-08 DIAGNOSIS — Z794 Long term (current) use of insulin: Secondary | ICD-10-CM | POA: Diagnosis not present

## 2021-02-08 DIAGNOSIS — K219 Gastro-esophageal reflux disease without esophagitis: Secondary | ICD-10-CM | POA: Diagnosis not present

## 2021-02-08 DIAGNOSIS — E1122 Type 2 diabetes mellitus with diabetic chronic kidney disease: Secondary | ICD-10-CM | POA: Diagnosis not present

## 2021-02-08 DIAGNOSIS — I152 Hypertension secondary to endocrine disorders: Secondary | ICD-10-CM | POA: Diagnosis not present

## 2021-02-08 DIAGNOSIS — E1169 Type 2 diabetes mellitus with other specified complication: Secondary | ICD-10-CM | POA: Diagnosis not present

## 2021-02-08 DIAGNOSIS — F331 Major depressive disorder, recurrent, moderate: Secondary | ICD-10-CM | POA: Diagnosis not present

## 2021-02-08 DIAGNOSIS — R296 Repeated falls: Secondary | ICD-10-CM | POA: Diagnosis not present

## 2021-02-08 DIAGNOSIS — Z89511 Acquired absence of right leg below knee: Secondary | ICD-10-CM | POA: Diagnosis not present

## 2021-02-08 DIAGNOSIS — E785 Hyperlipidemia, unspecified: Secondary | ICD-10-CM | POA: Diagnosis not present

## 2021-02-08 DIAGNOSIS — N183 Chronic kidney disease, stage 3 unspecified: Secondary | ICD-10-CM | POA: Diagnosis not present

## 2021-02-09 DIAGNOSIS — F331 Major depressive disorder, recurrent, moderate: Secondary | ICD-10-CM | POA: Diagnosis not present

## 2021-02-10 DIAGNOSIS — N183 Chronic kidney disease, stage 3 unspecified: Secondary | ICD-10-CM | POA: Diagnosis not present

## 2021-02-10 DIAGNOSIS — Z794 Long term (current) use of insulin: Secondary | ICD-10-CM | POA: Diagnosis not present

## 2021-02-10 DIAGNOSIS — K219 Gastro-esophageal reflux disease without esophagitis: Secondary | ICD-10-CM | POA: Diagnosis not present

## 2021-02-10 DIAGNOSIS — E785 Hyperlipidemia, unspecified: Secondary | ICD-10-CM | POA: Diagnosis not present

## 2021-02-10 DIAGNOSIS — F331 Major depressive disorder, recurrent, moderate: Secondary | ICD-10-CM | POA: Diagnosis not present

## 2021-02-10 DIAGNOSIS — Z89511 Acquired absence of right leg below knee: Secondary | ICD-10-CM | POA: Diagnosis not present

## 2021-02-10 DIAGNOSIS — E1122 Type 2 diabetes mellitus with diabetic chronic kidney disease: Secondary | ICD-10-CM | POA: Diagnosis not present

## 2021-02-10 DIAGNOSIS — E1159 Type 2 diabetes mellitus with other circulatory complications: Secondary | ICD-10-CM | POA: Diagnosis not present

## 2021-02-10 DIAGNOSIS — I152 Hypertension secondary to endocrine disorders: Secondary | ICD-10-CM | POA: Diagnosis not present

## 2021-02-10 DIAGNOSIS — E1169 Type 2 diabetes mellitus with other specified complication: Secondary | ICD-10-CM | POA: Diagnosis not present

## 2021-02-11 DIAGNOSIS — F331 Major depressive disorder, recurrent, moderate: Secondary | ICD-10-CM | POA: Diagnosis not present

## 2021-02-12 DIAGNOSIS — Z89511 Acquired absence of right leg below knee: Secondary | ICD-10-CM | POA: Diagnosis not present

## 2021-02-12 DIAGNOSIS — F330 Major depressive disorder, recurrent, mild: Secondary | ICD-10-CM | POA: Diagnosis not present

## 2021-02-12 DIAGNOSIS — F331 Major depressive disorder, recurrent, moderate: Secondary | ICD-10-CM | POA: Diagnosis not present

## 2021-02-12 DIAGNOSIS — E1122 Type 2 diabetes mellitus with diabetic chronic kidney disease: Secondary | ICD-10-CM | POA: Diagnosis not present

## 2021-02-12 DIAGNOSIS — E1159 Type 2 diabetes mellitus with other circulatory complications: Secondary | ICD-10-CM | POA: Diagnosis not present

## 2021-02-12 DIAGNOSIS — N183 Chronic kidney disease, stage 3 unspecified: Secondary | ICD-10-CM | POA: Diagnosis not present

## 2021-02-12 DIAGNOSIS — I739 Peripheral vascular disease, unspecified: Secondary | ICD-10-CM | POA: Diagnosis not present

## 2021-02-12 DIAGNOSIS — E785 Hyperlipidemia, unspecified: Secondary | ICD-10-CM | POA: Diagnosis not present

## 2021-02-12 DIAGNOSIS — I152 Hypertension secondary to endocrine disorders: Secondary | ICD-10-CM | POA: Diagnosis not present

## 2021-02-12 DIAGNOSIS — E1169 Type 2 diabetes mellitus with other specified complication: Secondary | ICD-10-CM | POA: Diagnosis not present

## 2021-02-12 DIAGNOSIS — K219 Gastro-esophageal reflux disease without esophagitis: Secondary | ICD-10-CM | POA: Diagnosis not present

## 2021-02-12 DIAGNOSIS — Z794 Long term (current) use of insulin: Secondary | ICD-10-CM | POA: Diagnosis not present

## 2021-02-13 DIAGNOSIS — F331 Major depressive disorder, recurrent, moderate: Secondary | ICD-10-CM | POA: Diagnosis not present

## 2021-02-14 DIAGNOSIS — Z794 Long term (current) use of insulin: Secondary | ICD-10-CM | POA: Diagnosis not present

## 2021-02-14 DIAGNOSIS — N183 Chronic kidney disease, stage 3 unspecified: Secondary | ICD-10-CM | POA: Diagnosis not present

## 2021-02-14 DIAGNOSIS — E1169 Type 2 diabetes mellitus with other specified complication: Secondary | ICD-10-CM | POA: Diagnosis not present

## 2021-02-14 DIAGNOSIS — Z89511 Acquired absence of right leg below knee: Secondary | ICD-10-CM | POA: Diagnosis not present

## 2021-02-14 DIAGNOSIS — E785 Hyperlipidemia, unspecified: Secondary | ICD-10-CM | POA: Diagnosis not present

## 2021-02-14 DIAGNOSIS — I739 Peripheral vascular disease, unspecified: Secondary | ICD-10-CM | POA: Diagnosis not present

## 2021-02-14 DIAGNOSIS — F331 Major depressive disorder, recurrent, moderate: Secondary | ICD-10-CM | POA: Diagnosis not present

## 2021-02-14 DIAGNOSIS — I152 Hypertension secondary to endocrine disorders: Secondary | ICD-10-CM | POA: Diagnosis not present

## 2021-02-14 DIAGNOSIS — F330 Major depressive disorder, recurrent, mild: Secondary | ICD-10-CM | POA: Diagnosis not present

## 2021-02-14 DIAGNOSIS — E1159 Type 2 diabetes mellitus with other circulatory complications: Secondary | ICD-10-CM | POA: Diagnosis not present

## 2021-02-14 DIAGNOSIS — E1122 Type 2 diabetes mellitus with diabetic chronic kidney disease: Secondary | ICD-10-CM | POA: Diagnosis not present

## 2021-02-14 DIAGNOSIS — K219 Gastro-esophageal reflux disease without esophagitis: Secondary | ICD-10-CM | POA: Diagnosis not present

## 2021-02-15 DIAGNOSIS — F331 Major depressive disorder, recurrent, moderate: Secondary | ICD-10-CM | POA: Diagnosis not present

## 2021-02-16 DIAGNOSIS — N183 Chronic kidney disease, stage 3 unspecified: Secondary | ICD-10-CM | POA: Diagnosis not present

## 2021-02-16 DIAGNOSIS — Z89511 Acquired absence of right leg below knee: Secondary | ICD-10-CM | POA: Diagnosis not present

## 2021-02-16 DIAGNOSIS — I152 Hypertension secondary to endocrine disorders: Secondary | ICD-10-CM | POA: Diagnosis not present

## 2021-02-16 DIAGNOSIS — E1122 Type 2 diabetes mellitus with diabetic chronic kidney disease: Secondary | ICD-10-CM | POA: Diagnosis not present

## 2021-02-16 DIAGNOSIS — W19XXXD Unspecified fall, subsequent encounter: Secondary | ICD-10-CM | POA: Diagnosis not present

## 2021-02-16 DIAGNOSIS — E1159 Type 2 diabetes mellitus with other circulatory complications: Secondary | ICD-10-CM | POA: Diagnosis not present

## 2021-02-16 DIAGNOSIS — K219 Gastro-esophageal reflux disease without esophagitis: Secondary | ICD-10-CM | POA: Diagnosis not present

## 2021-02-16 DIAGNOSIS — Y92239 Unspecified place in hospital as the place of occurrence of the external cause: Secondary | ICD-10-CM | POA: Diagnosis not present

## 2021-02-16 DIAGNOSIS — E1169 Type 2 diabetes mellitus with other specified complication: Secondary | ICD-10-CM | POA: Diagnosis not present

## 2021-02-16 DIAGNOSIS — F331 Major depressive disorder, recurrent, moderate: Secondary | ICD-10-CM | POA: Diagnosis not present

## 2021-02-16 DIAGNOSIS — I739 Peripheral vascular disease, unspecified: Secondary | ICD-10-CM | POA: Diagnosis not present

## 2021-02-16 DIAGNOSIS — E785 Hyperlipidemia, unspecified: Secondary | ICD-10-CM | POA: Diagnosis not present

## 2021-02-17 DIAGNOSIS — F331 Major depressive disorder, recurrent, moderate: Secondary | ICD-10-CM | POA: Diagnosis not present

## 2021-02-18 DIAGNOSIS — K219 Gastro-esophageal reflux disease without esophagitis: Secondary | ICD-10-CM | POA: Diagnosis not present

## 2021-02-18 DIAGNOSIS — E1169 Type 2 diabetes mellitus with other specified complication: Secondary | ICD-10-CM | POA: Diagnosis not present

## 2021-02-18 DIAGNOSIS — E1159 Type 2 diabetes mellitus with other circulatory complications: Secondary | ICD-10-CM | POA: Diagnosis not present

## 2021-02-18 DIAGNOSIS — I152 Hypertension secondary to endocrine disorders: Secondary | ICD-10-CM | POA: Diagnosis not present

## 2021-02-18 DIAGNOSIS — N183 Chronic kidney disease, stage 3 unspecified: Secondary | ICD-10-CM | POA: Diagnosis not present

## 2021-02-18 DIAGNOSIS — Z89511 Acquired absence of right leg below knee: Secondary | ICD-10-CM | POA: Diagnosis not present

## 2021-02-18 DIAGNOSIS — Z794 Long term (current) use of insulin: Secondary | ICD-10-CM | POA: Diagnosis not present

## 2021-02-18 DIAGNOSIS — F331 Major depressive disorder, recurrent, moderate: Secondary | ICD-10-CM | POA: Diagnosis not present

## 2021-02-18 DIAGNOSIS — E1122 Type 2 diabetes mellitus with diabetic chronic kidney disease: Secondary | ICD-10-CM | POA: Diagnosis not present

## 2021-02-18 DIAGNOSIS — E785 Hyperlipidemia, unspecified: Secondary | ICD-10-CM | POA: Diagnosis not present

## 2021-02-19 DIAGNOSIS — F331 Major depressive disorder, recurrent, moderate: Secondary | ICD-10-CM | POA: Diagnosis not present

## 2021-02-20 DIAGNOSIS — E1122 Type 2 diabetes mellitus with diabetic chronic kidney disease: Secondary | ICD-10-CM | POA: Diagnosis not present

## 2021-02-20 DIAGNOSIS — E1159 Type 2 diabetes mellitus with other circulatory complications: Secondary | ICD-10-CM | POA: Diagnosis not present

## 2021-02-20 DIAGNOSIS — E785 Hyperlipidemia, unspecified: Secondary | ICD-10-CM | POA: Diagnosis not present

## 2021-02-20 DIAGNOSIS — F331 Major depressive disorder, recurrent, moderate: Secondary | ICD-10-CM | POA: Diagnosis not present

## 2021-02-20 DIAGNOSIS — Z794 Long term (current) use of insulin: Secondary | ICD-10-CM | POA: Diagnosis not present

## 2021-02-20 DIAGNOSIS — E1169 Type 2 diabetes mellitus with other specified complication: Secondary | ICD-10-CM | POA: Diagnosis not present

## 2021-02-20 DIAGNOSIS — N183 Chronic kidney disease, stage 3 unspecified: Secondary | ICD-10-CM | POA: Diagnosis not present

## 2021-02-20 DIAGNOSIS — Z89511 Acquired absence of right leg below knee: Secondary | ICD-10-CM | POA: Diagnosis not present

## 2021-02-20 DIAGNOSIS — K219 Gastro-esophageal reflux disease without esophagitis: Secondary | ICD-10-CM | POA: Diagnosis not present

## 2021-02-20 DIAGNOSIS — I152 Hypertension secondary to endocrine disorders: Secondary | ICD-10-CM | POA: Diagnosis not present

## 2021-02-21 DIAGNOSIS — E1122 Type 2 diabetes mellitus with diabetic chronic kidney disease: Secondary | ICD-10-CM | POA: Diagnosis not present

## 2021-02-21 DIAGNOSIS — K219 Gastro-esophageal reflux disease without esophagitis: Secondary | ICD-10-CM | POA: Diagnosis not present

## 2021-02-21 DIAGNOSIS — N183 Chronic kidney disease, stage 3 unspecified: Secondary | ICD-10-CM | POA: Diagnosis not present

## 2021-02-21 DIAGNOSIS — E785 Hyperlipidemia, unspecified: Secondary | ICD-10-CM | POA: Diagnosis not present

## 2021-02-21 DIAGNOSIS — I152 Hypertension secondary to endocrine disorders: Secondary | ICD-10-CM | POA: Diagnosis not present

## 2021-02-21 DIAGNOSIS — E1159 Type 2 diabetes mellitus with other circulatory complications: Secondary | ICD-10-CM | POA: Diagnosis not present

## 2021-02-21 DIAGNOSIS — E1169 Type 2 diabetes mellitus with other specified complication: Secondary | ICD-10-CM | POA: Diagnosis not present

## 2021-02-21 DIAGNOSIS — Z794 Long term (current) use of insulin: Secondary | ICD-10-CM | POA: Diagnosis not present

## 2021-02-21 DIAGNOSIS — Z89511 Acquired absence of right leg below knee: Secondary | ICD-10-CM | POA: Diagnosis not present

## 2021-02-21 DIAGNOSIS — F331 Major depressive disorder, recurrent, moderate: Secondary | ICD-10-CM | POA: Diagnosis not present

## 2021-02-22 DIAGNOSIS — F331 Major depressive disorder, recurrent, moderate: Secondary | ICD-10-CM | POA: Diagnosis not present

## 2021-02-23 DIAGNOSIS — E785 Hyperlipidemia, unspecified: Secondary | ICD-10-CM | POA: Diagnosis not present

## 2021-02-23 DIAGNOSIS — I152 Hypertension secondary to endocrine disorders: Secondary | ICD-10-CM | POA: Diagnosis not present

## 2021-02-23 DIAGNOSIS — F331 Major depressive disorder, recurrent, moderate: Secondary | ICD-10-CM | POA: Diagnosis not present

## 2021-02-23 DIAGNOSIS — E1122 Type 2 diabetes mellitus with diabetic chronic kidney disease: Secondary | ICD-10-CM | POA: Diagnosis not present

## 2021-02-23 DIAGNOSIS — K219 Gastro-esophageal reflux disease without esophagitis: Secondary | ICD-10-CM | POA: Diagnosis not present

## 2021-02-23 DIAGNOSIS — N183 Chronic kidney disease, stage 3 unspecified: Secondary | ICD-10-CM | POA: Diagnosis not present

## 2021-02-23 DIAGNOSIS — E1159 Type 2 diabetes mellitus with other circulatory complications: Secondary | ICD-10-CM | POA: Diagnosis not present

## 2021-02-23 DIAGNOSIS — Z794 Long term (current) use of insulin: Secondary | ICD-10-CM | POA: Diagnosis not present

## 2021-02-23 DIAGNOSIS — Z89511 Acquired absence of right leg below knee: Secondary | ICD-10-CM | POA: Diagnosis not present

## 2021-02-23 DIAGNOSIS — E1169 Type 2 diabetes mellitus with other specified complication: Secondary | ICD-10-CM | POA: Diagnosis not present

## 2021-02-24 DIAGNOSIS — F331 Major depressive disorder, recurrent, moderate: Secondary | ICD-10-CM | POA: Diagnosis not present

## 2021-02-25 DIAGNOSIS — E1159 Type 2 diabetes mellitus with other circulatory complications: Secondary | ICD-10-CM | POA: Diagnosis not present

## 2021-02-25 DIAGNOSIS — E1169 Type 2 diabetes mellitus with other specified complication: Secondary | ICD-10-CM | POA: Diagnosis not present

## 2021-02-25 DIAGNOSIS — F330 Major depressive disorder, recurrent, mild: Secondary | ICD-10-CM | POA: Diagnosis not present

## 2021-02-25 DIAGNOSIS — Z794 Long term (current) use of insulin: Secondary | ICD-10-CM | POA: Diagnosis not present

## 2021-02-25 DIAGNOSIS — K219 Gastro-esophageal reflux disease without esophagitis: Secondary | ICD-10-CM | POA: Diagnosis not present

## 2021-02-25 DIAGNOSIS — N183 Chronic kidney disease, stage 3 unspecified: Secondary | ICD-10-CM | POA: Diagnosis not present

## 2021-02-25 DIAGNOSIS — F331 Major depressive disorder, recurrent, moderate: Secondary | ICD-10-CM | POA: Diagnosis not present

## 2021-02-25 DIAGNOSIS — I739 Peripheral vascular disease, unspecified: Secondary | ICD-10-CM | POA: Diagnosis not present

## 2021-02-25 DIAGNOSIS — E1122 Type 2 diabetes mellitus with diabetic chronic kidney disease: Secondary | ICD-10-CM | POA: Diagnosis not present

## 2021-02-25 DIAGNOSIS — Z89511 Acquired absence of right leg below knee: Secondary | ICD-10-CM | POA: Diagnosis not present

## 2021-02-25 DIAGNOSIS — I152 Hypertension secondary to endocrine disorders: Secondary | ICD-10-CM | POA: Diagnosis not present

## 2021-02-25 DIAGNOSIS — E785 Hyperlipidemia, unspecified: Secondary | ICD-10-CM | POA: Diagnosis not present

## 2021-02-26 DIAGNOSIS — F331 Major depressive disorder, recurrent, moderate: Secondary | ICD-10-CM | POA: Diagnosis not present

## 2021-02-27 DIAGNOSIS — E1159 Type 2 diabetes mellitus with other circulatory complications: Secondary | ICD-10-CM | POA: Diagnosis not present

## 2021-02-27 DIAGNOSIS — E1169 Type 2 diabetes mellitus with other specified complication: Secondary | ICD-10-CM | POA: Diagnosis not present

## 2021-02-27 DIAGNOSIS — F330 Major depressive disorder, recurrent, mild: Secondary | ICD-10-CM | POA: Diagnosis not present

## 2021-02-27 DIAGNOSIS — I739 Peripheral vascular disease, unspecified: Secondary | ICD-10-CM | POA: Diagnosis not present

## 2021-02-27 DIAGNOSIS — I152 Hypertension secondary to endocrine disorders: Secondary | ICD-10-CM | POA: Diagnosis not present

## 2021-02-27 DIAGNOSIS — Z89511 Acquired absence of right leg below knee: Secondary | ICD-10-CM | POA: Diagnosis not present

## 2021-02-27 DIAGNOSIS — E785 Hyperlipidemia, unspecified: Secondary | ICD-10-CM | POA: Diagnosis not present

## 2021-02-27 DIAGNOSIS — N183 Chronic kidney disease, stage 3 unspecified: Secondary | ICD-10-CM | POA: Diagnosis not present

## 2021-02-27 DIAGNOSIS — Z794 Long term (current) use of insulin: Secondary | ICD-10-CM | POA: Diagnosis not present

## 2021-02-27 DIAGNOSIS — K219 Gastro-esophageal reflux disease without esophagitis: Secondary | ICD-10-CM | POA: Diagnosis not present

## 2021-02-27 DIAGNOSIS — F331 Major depressive disorder, recurrent, moderate: Secondary | ICD-10-CM | POA: Diagnosis not present

## 2021-02-27 DIAGNOSIS — E1122 Type 2 diabetes mellitus with diabetic chronic kidney disease: Secondary | ICD-10-CM | POA: Diagnosis not present

## 2021-02-28 DIAGNOSIS — F331 Major depressive disorder, recurrent, moderate: Secondary | ICD-10-CM | POA: Diagnosis not present

## 2021-03-01 DIAGNOSIS — E1169 Type 2 diabetes mellitus with other specified complication: Secondary | ICD-10-CM | POA: Diagnosis not present

## 2021-03-01 DIAGNOSIS — K219 Gastro-esophageal reflux disease without esophagitis: Secondary | ICD-10-CM | POA: Diagnosis not present

## 2021-03-01 DIAGNOSIS — Z89511 Acquired absence of right leg below knee: Secondary | ICD-10-CM | POA: Diagnosis not present

## 2021-03-01 DIAGNOSIS — E1159 Type 2 diabetes mellitus with other circulatory complications: Secondary | ICD-10-CM | POA: Diagnosis not present

## 2021-03-01 DIAGNOSIS — E1122 Type 2 diabetes mellitus with diabetic chronic kidney disease: Secondary | ICD-10-CM | POA: Diagnosis not present

## 2021-03-01 DIAGNOSIS — F330 Major depressive disorder, recurrent, mild: Secondary | ICD-10-CM | POA: Diagnosis not present

## 2021-03-01 DIAGNOSIS — F331 Major depressive disorder, recurrent, moderate: Secondary | ICD-10-CM | POA: Diagnosis not present

## 2021-03-01 DIAGNOSIS — I152 Hypertension secondary to endocrine disorders: Secondary | ICD-10-CM | POA: Diagnosis not present

## 2021-03-01 DIAGNOSIS — I739 Peripheral vascular disease, unspecified: Secondary | ICD-10-CM | POA: Diagnosis not present

## 2021-03-01 DIAGNOSIS — Z794 Long term (current) use of insulin: Secondary | ICD-10-CM | POA: Diagnosis not present

## 2021-03-01 DIAGNOSIS — N183 Chronic kidney disease, stage 3 unspecified: Secondary | ICD-10-CM | POA: Diagnosis not present

## 2021-03-01 DIAGNOSIS — E785 Hyperlipidemia, unspecified: Secondary | ICD-10-CM | POA: Diagnosis not present

## 2021-03-02 DIAGNOSIS — F331 Major depressive disorder, recurrent, moderate: Secondary | ICD-10-CM | POA: Diagnosis not present

## 2021-03-03 DIAGNOSIS — F331 Major depressive disorder, recurrent, moderate: Secondary | ICD-10-CM | POA: Diagnosis not present

## 2021-03-04 DIAGNOSIS — Z794 Long term (current) use of insulin: Secondary | ICD-10-CM | POA: Diagnosis not present

## 2021-03-04 DIAGNOSIS — E1159 Type 2 diabetes mellitus with other circulatory complications: Secondary | ICD-10-CM | POA: Diagnosis not present

## 2021-03-04 DIAGNOSIS — Z89511 Acquired absence of right leg below knee: Secondary | ICD-10-CM | POA: Diagnosis not present

## 2021-03-04 DIAGNOSIS — F331 Major depressive disorder, recurrent, moderate: Secondary | ICD-10-CM | POA: Diagnosis not present

## 2021-03-04 DIAGNOSIS — I152 Hypertension secondary to endocrine disorders: Secondary | ICD-10-CM | POA: Diagnosis not present

## 2021-03-04 DIAGNOSIS — K219 Gastro-esophageal reflux disease without esophagitis: Secondary | ICD-10-CM | POA: Diagnosis not present

## 2021-03-04 DIAGNOSIS — E1169 Type 2 diabetes mellitus with other specified complication: Secondary | ICD-10-CM | POA: Diagnosis not present

## 2021-03-04 DIAGNOSIS — E785 Hyperlipidemia, unspecified: Secondary | ICD-10-CM | POA: Diagnosis not present

## 2021-03-04 DIAGNOSIS — E1122 Type 2 diabetes mellitus with diabetic chronic kidney disease: Secondary | ICD-10-CM | POA: Diagnosis not present

## 2021-03-04 DIAGNOSIS — N183 Chronic kidney disease, stage 3 unspecified: Secondary | ICD-10-CM | POA: Diagnosis not present

## 2021-03-05 DIAGNOSIS — F331 Major depressive disorder, recurrent, moderate: Secondary | ICD-10-CM | POA: Diagnosis not present

## 2021-03-06 DIAGNOSIS — Z89511 Acquired absence of right leg below knee: Secondary | ICD-10-CM | POA: Diagnosis not present

## 2021-03-06 DIAGNOSIS — E1169 Type 2 diabetes mellitus with other specified complication: Secondary | ICD-10-CM | POA: Diagnosis not present

## 2021-03-06 DIAGNOSIS — Z794 Long term (current) use of insulin: Secondary | ICD-10-CM | POA: Diagnosis not present

## 2021-03-06 DIAGNOSIS — E785 Hyperlipidemia, unspecified: Secondary | ICD-10-CM | POA: Diagnosis not present

## 2021-03-06 DIAGNOSIS — E1159 Type 2 diabetes mellitus with other circulatory complications: Secondary | ICD-10-CM | POA: Diagnosis not present

## 2021-03-06 DIAGNOSIS — K219 Gastro-esophageal reflux disease without esophagitis: Secondary | ICD-10-CM | POA: Diagnosis not present

## 2021-03-06 DIAGNOSIS — E1122 Type 2 diabetes mellitus with diabetic chronic kidney disease: Secondary | ICD-10-CM | POA: Diagnosis not present

## 2021-03-06 DIAGNOSIS — I152 Hypertension secondary to endocrine disorders: Secondary | ICD-10-CM | POA: Diagnosis not present

## 2021-03-06 DIAGNOSIS — F331 Major depressive disorder, recurrent, moderate: Secondary | ICD-10-CM | POA: Diagnosis not present

## 2021-03-06 DIAGNOSIS — N183 Chronic kidney disease, stage 3 unspecified: Secondary | ICD-10-CM | POA: Diagnosis not present

## 2021-03-07 DIAGNOSIS — F331 Major depressive disorder, recurrent, moderate: Secondary | ICD-10-CM | POA: Diagnosis not present

## 2021-03-08 DIAGNOSIS — Z794 Long term (current) use of insulin: Secondary | ICD-10-CM | POA: Diagnosis not present

## 2021-03-08 DIAGNOSIS — E785 Hyperlipidemia, unspecified: Secondary | ICD-10-CM | POA: Diagnosis not present

## 2021-03-08 DIAGNOSIS — K219 Gastro-esophageal reflux disease without esophagitis: Secondary | ICD-10-CM | POA: Diagnosis not present

## 2021-03-08 DIAGNOSIS — E1122 Type 2 diabetes mellitus with diabetic chronic kidney disease: Secondary | ICD-10-CM | POA: Diagnosis not present

## 2021-03-08 DIAGNOSIS — Z89511 Acquired absence of right leg below knee: Secondary | ICD-10-CM | POA: Diagnosis not present

## 2021-03-08 DIAGNOSIS — I152 Hypertension secondary to endocrine disorders: Secondary | ICD-10-CM | POA: Diagnosis not present

## 2021-03-08 DIAGNOSIS — N183 Chronic kidney disease, stage 3 unspecified: Secondary | ICD-10-CM | POA: Diagnosis not present

## 2021-03-08 DIAGNOSIS — E1159 Type 2 diabetes mellitus with other circulatory complications: Secondary | ICD-10-CM | POA: Diagnosis not present

## 2021-03-08 DIAGNOSIS — E1169 Type 2 diabetes mellitus with other specified complication: Secondary | ICD-10-CM | POA: Diagnosis not present

## 2021-03-09 DIAGNOSIS — F331 Major depressive disorder, recurrent, moderate: Secondary | ICD-10-CM | POA: Diagnosis not present

## 2021-03-10 DIAGNOSIS — E785 Hyperlipidemia, unspecified: Secondary | ICD-10-CM | POA: Diagnosis not present

## 2021-03-10 DIAGNOSIS — I152 Hypertension secondary to endocrine disorders: Secondary | ICD-10-CM | POA: Diagnosis not present

## 2021-03-10 DIAGNOSIS — Z89511 Acquired absence of right leg below knee: Secondary | ICD-10-CM | POA: Diagnosis not present

## 2021-03-10 DIAGNOSIS — E1159 Type 2 diabetes mellitus with other circulatory complications: Secondary | ICD-10-CM | POA: Diagnosis not present

## 2021-03-10 DIAGNOSIS — E1122 Type 2 diabetes mellitus with diabetic chronic kidney disease: Secondary | ICD-10-CM | POA: Diagnosis not present

## 2021-03-10 DIAGNOSIS — E1169 Type 2 diabetes mellitus with other specified complication: Secondary | ICD-10-CM | POA: Diagnosis not present

## 2021-03-10 DIAGNOSIS — F331 Major depressive disorder, recurrent, moderate: Secondary | ICD-10-CM | POA: Diagnosis not present

## 2021-03-10 DIAGNOSIS — Z794 Long term (current) use of insulin: Secondary | ICD-10-CM | POA: Diagnosis not present

## 2021-03-10 DIAGNOSIS — N183 Chronic kidney disease, stage 3 unspecified: Secondary | ICD-10-CM | POA: Diagnosis not present

## 2021-03-10 DIAGNOSIS — K219 Gastro-esophageal reflux disease without esophagitis: Secondary | ICD-10-CM | POA: Diagnosis not present

## 2021-03-11 DIAGNOSIS — F331 Major depressive disorder, recurrent, moderate: Secondary | ICD-10-CM | POA: Diagnosis not present

## 2021-03-12 DIAGNOSIS — F331 Major depressive disorder, recurrent, moderate: Secondary | ICD-10-CM | POA: Diagnosis not present

## 2021-03-13 DIAGNOSIS — F331 Major depressive disorder, recurrent, moderate: Secondary | ICD-10-CM | POA: Diagnosis not present

## 2021-03-14 DIAGNOSIS — F331 Major depressive disorder, recurrent, moderate: Secondary | ICD-10-CM | POA: Diagnosis not present

## 2021-03-15 DIAGNOSIS — F331 Major depressive disorder, recurrent, moderate: Secondary | ICD-10-CM | POA: Diagnosis not present

## 2021-03-16 DIAGNOSIS — F331 Major depressive disorder, recurrent, moderate: Secondary | ICD-10-CM | POA: Diagnosis not present

## 2021-04-15 DIAGNOSIS — F319 Bipolar disorder, unspecified: Secondary | ICD-10-CM | POA: Diagnosis not present

## 2021-04-30 DIAGNOSIS — E119 Type 2 diabetes mellitus without complications: Secondary | ICD-10-CM | POA: Diagnosis not present

## 2021-04-30 DIAGNOSIS — J45909 Unspecified asthma, uncomplicated: Secondary | ICD-10-CM | POA: Diagnosis not present

## 2021-04-30 DIAGNOSIS — G4733 Obstructive sleep apnea (adult) (pediatric): Secondary | ICD-10-CM | POA: Diagnosis not present

## 2021-04-30 DIAGNOSIS — M5412 Radiculopathy, cervical region: Secondary | ICD-10-CM | POA: Diagnosis not present

## 2021-04-30 DIAGNOSIS — D519 Vitamin B12 deficiency anemia, unspecified: Secondary | ICD-10-CM | POA: Diagnosis not present

## 2021-06-06 DIAGNOSIS — E119 Type 2 diabetes mellitus without complications: Secondary | ICD-10-CM | POA: Diagnosis not present

## 2021-06-06 DIAGNOSIS — Z79899 Other long term (current) drug therapy: Secondary | ICD-10-CM | POA: Diagnosis not present

## 2021-06-11 DIAGNOSIS — K219 Gastro-esophageal reflux disease without esophagitis: Secondary | ICD-10-CM | POA: Diagnosis not present

## 2021-06-11 DIAGNOSIS — E118 Type 2 diabetes mellitus with unspecified complications: Secondary | ICD-10-CM | POA: Diagnosis not present

## 2021-06-11 DIAGNOSIS — L03116 Cellulitis of left lower limb: Secondary | ICD-10-CM | POA: Diagnosis not present

## 2021-06-11 DIAGNOSIS — G4089 Other seizures: Secondary | ICD-10-CM | POA: Diagnosis not present

## 2021-06-20 DIAGNOSIS — F32A Depression, unspecified: Secondary | ICD-10-CM | POA: Diagnosis not present

## 2021-06-20 DIAGNOSIS — F319 Bipolar disorder, unspecified: Secondary | ICD-10-CM | POA: Diagnosis not present

## 2021-06-20 DIAGNOSIS — F411 Generalized anxiety disorder: Secondary | ICD-10-CM | POA: Diagnosis not present

## 2021-06-21 DIAGNOSIS — R9089 Other abnormal findings on diagnostic imaging of central nervous system: Secondary | ICD-10-CM | POA: Diagnosis not present

## 2021-06-21 DIAGNOSIS — R531 Weakness: Secondary | ICD-10-CM | POA: Diagnosis not present

## 2021-06-21 DIAGNOSIS — R42 Dizziness and giddiness: Secondary | ICD-10-CM | POA: Diagnosis not present

## 2021-06-21 DIAGNOSIS — E119 Type 2 diabetes mellitus without complications: Secondary | ICD-10-CM | POA: Diagnosis not present

## 2021-06-21 DIAGNOSIS — S0990XA Unspecified injury of head, initial encounter: Secondary | ICD-10-CM | POA: Diagnosis not present

## 2021-06-21 DIAGNOSIS — R944 Abnormal results of kidney function studies: Secondary | ICD-10-CM | POA: Diagnosis not present

## 2021-06-21 DIAGNOSIS — Z79899 Other long term (current) drug therapy: Secondary | ICD-10-CM | POA: Diagnosis not present

## 2021-06-21 DIAGNOSIS — Z89619 Acquired absence of unspecified leg above knee: Secondary | ICD-10-CM | POA: Diagnosis not present

## 2021-06-21 DIAGNOSIS — M6281 Muscle weakness (generalized): Secondary | ICD-10-CM | POA: Diagnosis not present

## 2021-06-21 DIAGNOSIS — R112 Nausea with vomiting, unspecified: Secondary | ICD-10-CM | POA: Diagnosis not present

## 2021-06-21 DIAGNOSIS — I10 Essential (primary) hypertension: Secondary | ICD-10-CM | POA: Diagnosis not present

## 2021-06-21 DIAGNOSIS — Z20822 Contact with and (suspected) exposure to covid-19: Secondary | ICD-10-CM | POA: Diagnosis not present

## 2021-06-21 DIAGNOSIS — Z043 Encounter for examination and observation following other accident: Secondary | ICD-10-CM | POA: Diagnosis not present

## 2021-06-21 DIAGNOSIS — R5383 Other fatigue: Secondary | ICD-10-CM | POA: Diagnosis not present

## 2021-06-22 DIAGNOSIS — S0990XA Unspecified injury of head, initial encounter: Secondary | ICD-10-CM | POA: Diagnosis not present

## 2021-06-22 DIAGNOSIS — R42 Dizziness and giddiness: Secondary | ICD-10-CM | POA: Diagnosis not present

## 2021-06-22 DIAGNOSIS — R69 Illness, unspecified: Secondary | ICD-10-CM | POA: Diagnosis not present

## 2021-06-22 DIAGNOSIS — Z743 Need for continuous supervision: Secondary | ICD-10-CM | POA: Diagnosis not present

## 2021-06-22 DIAGNOSIS — Z043 Encounter for examination and observation following other accident: Secondary | ICD-10-CM | POA: Diagnosis not present

## 2021-06-22 DIAGNOSIS — Z0489 Encounter for examination and observation for other specified reasons: Secondary | ICD-10-CM | POA: Diagnosis not present

## 2021-06-22 DIAGNOSIS — R0902 Hypoxemia: Secondary | ICD-10-CM | POA: Diagnosis not present

## 2021-06-22 DIAGNOSIS — W19XXXA Unspecified fall, initial encounter: Secondary | ICD-10-CM | POA: Diagnosis not present

## 2021-06-22 DIAGNOSIS — Z89511 Acquired absence of right leg below knee: Secondary | ICD-10-CM | POA: Diagnosis not present

## 2021-07-04 DIAGNOSIS — F411 Generalized anxiety disorder: Secondary | ICD-10-CM | POA: Diagnosis not present

## 2021-07-04 DIAGNOSIS — F319 Bipolar disorder, unspecified: Secondary | ICD-10-CM | POA: Diagnosis not present

## 2021-07-04 DIAGNOSIS — F32A Depression, unspecified: Secondary | ICD-10-CM | POA: Diagnosis not present

## 2021-07-09 DIAGNOSIS — M5412 Radiculopathy, cervical region: Secondary | ICD-10-CM | POA: Diagnosis not present

## 2021-07-09 DIAGNOSIS — D509 Iron deficiency anemia, unspecified: Secondary | ICD-10-CM | POA: Diagnosis not present

## 2021-07-09 DIAGNOSIS — J45909 Unspecified asthma, uncomplicated: Secondary | ICD-10-CM | POA: Diagnosis not present

## 2021-07-09 DIAGNOSIS — G4733 Obstructive sleep apnea (adult) (pediatric): Secondary | ICD-10-CM | POA: Diagnosis not present

## 2021-07-11 DIAGNOSIS — E119 Type 2 diabetes mellitus without complications: Secondary | ICD-10-CM | POA: Diagnosis not present

## 2021-07-15 DIAGNOSIS — E119 Type 2 diabetes mellitus without complications: Secondary | ICD-10-CM | POA: Diagnosis not present

## 2021-07-23 DIAGNOSIS — S91302A Unspecified open wound, left foot, initial encounter: Secondary | ICD-10-CM | POA: Diagnosis not present

## 2021-08-01 DIAGNOSIS — F319 Bipolar disorder, unspecified: Secondary | ICD-10-CM | POA: Diagnosis not present

## 2021-08-01 DIAGNOSIS — F411 Generalized anxiety disorder: Secondary | ICD-10-CM | POA: Diagnosis not present

## 2021-08-01 DIAGNOSIS — F32A Depression, unspecified: Secondary | ICD-10-CM | POA: Diagnosis not present

## 2021-08-05 DIAGNOSIS — F329 Major depressive disorder, single episode, unspecified: Secondary | ICD-10-CM | POA: Diagnosis not present

## 2021-08-05 DIAGNOSIS — F419 Anxiety disorder, unspecified: Secondary | ICD-10-CM | POA: Diagnosis not present

## 2021-08-06 DIAGNOSIS — E118 Type 2 diabetes mellitus with unspecified complications: Secondary | ICD-10-CM | POA: Diagnosis not present

## 2021-08-06 DIAGNOSIS — K219 Gastro-esophageal reflux disease without esophagitis: Secondary | ICD-10-CM | POA: Diagnosis not present

## 2021-08-06 DIAGNOSIS — L97529 Non-pressure chronic ulcer of other part of left foot with unspecified severity: Secondary | ICD-10-CM | POA: Diagnosis not present

## 2021-08-29 DIAGNOSIS — F411 Generalized anxiety disorder: Secondary | ICD-10-CM | POA: Diagnosis not present

## 2021-08-29 DIAGNOSIS — G26 Extrapyramidal and movement disorders in diseases classified elsewhere: Secondary | ICD-10-CM | POA: Diagnosis not present

## 2021-08-29 DIAGNOSIS — F32A Depression, unspecified: Secondary | ICD-10-CM | POA: Diagnosis not present

## 2021-08-29 DIAGNOSIS — F319 Bipolar disorder, unspecified: Secondary | ICD-10-CM | POA: Diagnosis not present

## 2021-09-02 DIAGNOSIS — E118 Type 2 diabetes mellitus with unspecified complications: Secondary | ICD-10-CM | POA: Diagnosis not present

## 2021-09-02 DIAGNOSIS — K219 Gastro-esophageal reflux disease without esophagitis: Secondary | ICD-10-CM | POA: Diagnosis not present

## 2021-09-25 DIAGNOSIS — E11621 Type 2 diabetes mellitus with foot ulcer: Secondary | ICD-10-CM | POA: Diagnosis not present

## 2021-10-01 DIAGNOSIS — L97529 Non-pressure chronic ulcer of other part of left foot with unspecified severity: Secondary | ICD-10-CM | POA: Diagnosis not present

## 2021-10-01 DIAGNOSIS — E782 Mixed hyperlipidemia: Secondary | ICD-10-CM | POA: Diagnosis not present

## 2021-10-03 DIAGNOSIS — F411 Generalized anxiety disorder: Secondary | ICD-10-CM | POA: Diagnosis not present

## 2021-10-03 DIAGNOSIS — E119 Type 2 diabetes mellitus without complications: Secondary | ICD-10-CM | POA: Diagnosis not present

## 2021-10-03 DIAGNOSIS — E11621 Type 2 diabetes mellitus with foot ulcer: Secondary | ICD-10-CM | POA: Diagnosis not present

## 2021-10-03 DIAGNOSIS — F319 Bipolar disorder, unspecified: Secondary | ICD-10-CM | POA: Diagnosis not present

## 2021-10-03 DIAGNOSIS — E782 Mixed hyperlipidemia: Secondary | ICD-10-CM | POA: Diagnosis not present

## 2021-10-03 DIAGNOSIS — F32A Depression, unspecified: Secondary | ICD-10-CM | POA: Diagnosis not present

## 2021-10-16 DIAGNOSIS — Z794 Long term (current) use of insulin: Secondary | ICD-10-CM | POA: Diagnosis not present

## 2021-10-16 DIAGNOSIS — L97422 Non-pressure chronic ulcer of left heel and midfoot with fat layer exposed: Secondary | ICD-10-CM | POA: Diagnosis not present

## 2021-10-16 DIAGNOSIS — Z89412 Acquired absence of left great toe: Secondary | ICD-10-CM | POA: Diagnosis not present

## 2021-10-16 DIAGNOSIS — S80812A Abrasion, left lower leg, initial encounter: Secondary | ICD-10-CM | POA: Diagnosis not present

## 2021-10-16 DIAGNOSIS — R600 Localized edema: Secondary | ICD-10-CM | POA: Diagnosis not present

## 2021-10-16 DIAGNOSIS — L97828 Non-pressure chronic ulcer of other part of left lower leg with other specified severity: Secondary | ICD-10-CM | POA: Diagnosis not present

## 2021-10-16 DIAGNOSIS — X58XXXA Exposure to other specified factors, initial encounter: Secondary | ICD-10-CM | POA: Diagnosis not present

## 2021-10-16 DIAGNOSIS — Z89511 Acquired absence of right leg below knee: Secondary | ICD-10-CM | POA: Diagnosis not present

## 2021-10-16 DIAGNOSIS — L03116 Cellulitis of left lower limb: Secondary | ICD-10-CM | POA: Diagnosis not present

## 2021-10-16 DIAGNOSIS — S91302A Unspecified open wound, left foot, initial encounter: Secondary | ICD-10-CM | POA: Diagnosis not present

## 2021-10-16 DIAGNOSIS — M79672 Pain in left foot: Secondary | ICD-10-CM | POA: Diagnosis not present

## 2021-10-16 DIAGNOSIS — D649 Anemia, unspecified: Secondary | ICD-10-CM | POA: Diagnosis not present

## 2021-10-16 DIAGNOSIS — L97522 Non-pressure chronic ulcer of other part of left foot with fat layer exposed: Secondary | ICD-10-CM | POA: Diagnosis not present

## 2021-10-16 DIAGNOSIS — E11622 Type 2 diabetes mellitus with other skin ulcer: Secondary | ICD-10-CM | POA: Diagnosis not present

## 2021-10-16 DIAGNOSIS — L97528 Non-pressure chronic ulcer of other part of left foot with other specified severity: Secondary | ICD-10-CM | POA: Diagnosis not present

## 2021-10-16 DIAGNOSIS — S93125A Dislocation of metatarsophalangeal joint of left lesser toe(s), initial encounter: Secondary | ICD-10-CM | POA: Diagnosis not present

## 2021-10-16 DIAGNOSIS — M19072 Primary osteoarthritis, left ankle and foot: Secondary | ICD-10-CM | POA: Diagnosis not present

## 2021-10-16 DIAGNOSIS — L97521 Non-pressure chronic ulcer of other part of left foot limited to breakdown of skin: Secondary | ICD-10-CM | POA: Diagnosis not present

## 2021-10-16 DIAGNOSIS — E11621 Type 2 diabetes mellitus with foot ulcer: Secondary | ICD-10-CM | POA: Diagnosis not present

## 2021-10-23 DIAGNOSIS — E11621 Type 2 diabetes mellitus with foot ulcer: Secondary | ICD-10-CM | POA: Diagnosis not present

## 2021-10-23 DIAGNOSIS — L97522 Non-pressure chronic ulcer of other part of left foot with fat layer exposed: Secondary | ICD-10-CM | POA: Diagnosis not present

## 2021-10-23 DIAGNOSIS — L89620 Pressure ulcer of left heel, unstageable: Secondary | ICD-10-CM | POA: Diagnosis not present

## 2021-10-24 DIAGNOSIS — F32A Depression, unspecified: Secondary | ICD-10-CM | POA: Diagnosis not present

## 2021-10-24 DIAGNOSIS — F319 Bipolar disorder, unspecified: Secondary | ICD-10-CM | POA: Diagnosis not present

## 2021-10-24 DIAGNOSIS — F411 Generalized anxiety disorder: Secondary | ICD-10-CM | POA: Diagnosis not present

## 2021-10-29 DIAGNOSIS — K219 Gastro-esophageal reflux disease without esophagitis: Secondary | ICD-10-CM | POA: Diagnosis not present

## 2021-10-29 DIAGNOSIS — E118 Type 2 diabetes mellitus with unspecified complications: Secondary | ICD-10-CM | POA: Diagnosis not present

## 2021-10-30 DIAGNOSIS — L97522 Non-pressure chronic ulcer of other part of left foot with fat layer exposed: Secondary | ICD-10-CM | POA: Diagnosis not present

## 2021-10-30 DIAGNOSIS — E11621 Type 2 diabetes mellitus with foot ulcer: Secondary | ICD-10-CM | POA: Diagnosis not present

## 2021-11-05 DIAGNOSIS — R21 Rash and other nonspecific skin eruption: Secondary | ICD-10-CM | POA: Diagnosis not present

## 2021-11-06 DIAGNOSIS — L97522 Non-pressure chronic ulcer of other part of left foot with fat layer exposed: Secondary | ICD-10-CM | POA: Diagnosis not present

## 2021-11-06 DIAGNOSIS — E11621 Type 2 diabetes mellitus with foot ulcer: Secondary | ICD-10-CM | POA: Diagnosis not present

## 2021-11-13 DIAGNOSIS — Z89412 Acquired absence of left great toe: Secondary | ICD-10-CM | POA: Diagnosis not present

## 2021-11-13 DIAGNOSIS — L97422 Non-pressure chronic ulcer of left heel and midfoot with fat layer exposed: Secondary | ICD-10-CM | POA: Diagnosis not present

## 2021-11-13 DIAGNOSIS — E11610 Type 2 diabetes mellitus with diabetic neuropathic arthropathy: Secondary | ICD-10-CM | POA: Diagnosis not present

## 2021-11-13 DIAGNOSIS — L97528 Non-pressure chronic ulcer of other part of left foot with other specified severity: Secondary | ICD-10-CM | POA: Diagnosis not present

## 2021-11-13 DIAGNOSIS — E11621 Type 2 diabetes mellitus with foot ulcer: Secondary | ICD-10-CM | POA: Diagnosis not present

## 2021-11-13 DIAGNOSIS — E1151 Type 2 diabetes mellitus with diabetic peripheral angiopathy without gangrene: Secondary | ICD-10-CM | POA: Diagnosis not present

## 2021-11-13 DIAGNOSIS — T8781 Dehiscence of amputation stump: Secondary | ICD-10-CM | POA: Diagnosis not present

## 2021-11-14 DIAGNOSIS — E11622 Type 2 diabetes mellitus with other skin ulcer: Secondary | ICD-10-CM | POA: Diagnosis not present

## 2021-11-19 DIAGNOSIS — E11621 Type 2 diabetes mellitus with foot ulcer: Secondary | ICD-10-CM | POA: Diagnosis not present

## 2021-11-19 DIAGNOSIS — L97522 Non-pressure chronic ulcer of other part of left foot with fat layer exposed: Secondary | ICD-10-CM | POA: Diagnosis not present

## 2021-11-19 DIAGNOSIS — R2231 Localized swelling, mass and lump, right upper limb: Secondary | ICD-10-CM | POA: Diagnosis not present

## 2021-11-20 DIAGNOSIS — F319 Bipolar disorder, unspecified: Secondary | ICD-10-CM | POA: Diagnosis not present

## 2021-11-20 DIAGNOSIS — F32A Depression, unspecified: Secondary | ICD-10-CM | POA: Diagnosis not present

## 2021-11-20 DIAGNOSIS — F411 Generalized anxiety disorder: Secondary | ICD-10-CM | POA: Diagnosis not present

## 2021-11-26 DIAGNOSIS — L97522 Non-pressure chronic ulcer of other part of left foot with fat layer exposed: Secondary | ICD-10-CM | POA: Diagnosis not present

## 2021-11-26 DIAGNOSIS — E11621 Type 2 diabetes mellitus with foot ulcer: Secondary | ICD-10-CM | POA: Diagnosis not present

## 2021-12-01 DIAGNOSIS — W19XXXS Unspecified fall, sequela: Secondary | ICD-10-CM | POA: Diagnosis not present

## 2021-12-01 DIAGNOSIS — W19XXXA Unspecified fall, initial encounter: Secondary | ICD-10-CM | POA: Diagnosis not present

## 2021-12-01 DIAGNOSIS — S91302A Unspecified open wound, left foot, initial encounter: Secondary | ICD-10-CM | POA: Diagnosis not present

## 2021-12-01 DIAGNOSIS — I7389 Other specified peripheral vascular diseases: Secondary | ICD-10-CM | POA: Diagnosis not present

## 2021-12-01 DIAGNOSIS — N390 Urinary tract infection, site not specified: Secondary | ICD-10-CM | POA: Diagnosis not present

## 2021-12-01 DIAGNOSIS — J9601 Acute respiratory failure with hypoxia: Secondary | ICD-10-CM | POA: Diagnosis not present

## 2021-12-01 DIAGNOSIS — T148XXA Other injury of unspecified body region, initial encounter: Secondary | ICD-10-CM | POA: Diagnosis not present

## 2021-12-01 DIAGNOSIS — M2689 Other dentofacial anomalies: Secondary | ICD-10-CM | POA: Diagnosis not present

## 2021-12-01 DIAGNOSIS — S0302XA Dislocation of jaw, left side, initial encounter: Secondary | ICD-10-CM | POA: Diagnosis not present

## 2021-12-01 DIAGNOSIS — S0990XA Unspecified injury of head, initial encounter: Secondary | ICD-10-CM | POA: Diagnosis not present

## 2021-12-01 DIAGNOSIS — I959 Hypotension, unspecified: Secondary | ICD-10-CM | POA: Diagnosis not present

## 2021-12-01 DIAGNOSIS — R531 Weakness: Secondary | ICD-10-CM | POA: Diagnosis not present

## 2021-12-01 DIAGNOSIS — S199XXA Unspecified injury of neck, initial encounter: Secondary | ICD-10-CM | POA: Diagnosis not present

## 2021-12-01 DIAGNOSIS — M47812 Spondylosis without myelopathy or radiculopathy, cervical region: Secondary | ICD-10-CM | POA: Diagnosis not present

## 2021-12-01 DIAGNOSIS — R9431 Abnormal electrocardiogram [ECG] [EKG]: Secondary | ICD-10-CM | POA: Diagnosis not present

## 2021-12-01 DIAGNOSIS — Z91038 Other insect allergy status: Secondary | ICD-10-CM | POA: Diagnosis not present

## 2021-12-01 DIAGNOSIS — R296 Repeated falls: Secondary | ICD-10-CM | POA: Diagnosis not present

## 2021-12-01 DIAGNOSIS — E1122 Type 2 diabetes mellitus with diabetic chronic kidney disease: Secondary | ICD-10-CM | POA: Diagnosis not present

## 2021-12-01 DIAGNOSIS — I6782 Cerebral ischemia: Secondary | ICD-10-CM | POA: Diagnosis not present

## 2021-12-01 DIAGNOSIS — M5030 Other cervical disc degeneration, unspecified cervical region: Secondary | ICD-10-CM | POA: Diagnosis not present

## 2021-12-01 DIAGNOSIS — N189 Chronic kidney disease, unspecified: Secondary | ICD-10-CM | POA: Diagnosis not present

## 2021-12-01 DIAGNOSIS — D649 Anemia, unspecified: Secondary | ICD-10-CM | POA: Diagnosis not present

## 2021-12-01 DIAGNOSIS — M4302 Spondylolysis, cervical region: Secondary | ICD-10-CM | POA: Diagnosis not present

## 2021-12-01 DIAGNOSIS — G319 Degenerative disease of nervous system, unspecified: Secondary | ICD-10-CM | POA: Diagnosis not present

## 2021-12-01 DIAGNOSIS — N3000 Acute cystitis without hematuria: Secondary | ICD-10-CM | POA: Diagnosis not present

## 2021-12-01 DIAGNOSIS — Z89511 Acquired absence of right leg below knee: Secondary | ICD-10-CM | POA: Diagnosis not present

## 2021-12-02 DIAGNOSIS — W19XXXS Unspecified fall, sequela: Secondary | ICD-10-CM | POA: Diagnosis not present

## 2021-12-02 DIAGNOSIS — T148XXA Other injury of unspecified body region, initial encounter: Secondary | ICD-10-CM | POA: Diagnosis not present

## 2021-12-02 DIAGNOSIS — N3000 Acute cystitis without hematuria: Secondary | ICD-10-CM | POA: Diagnosis not present

## 2021-12-03 DIAGNOSIS — W19XXXS Unspecified fall, sequela: Secondary | ICD-10-CM | POA: Diagnosis not present

## 2021-12-03 DIAGNOSIS — Z7401 Bed confinement status: Secondary | ICD-10-CM | POA: Diagnosis not present

## 2021-12-03 DIAGNOSIS — N3000 Acute cystitis without hematuria: Secondary | ICD-10-CM | POA: Diagnosis not present

## 2021-12-03 DIAGNOSIS — T148XXA Other injury of unspecified body region, initial encounter: Secondary | ICD-10-CM | POA: Diagnosis not present

## 2021-12-03 DIAGNOSIS — R69 Illness, unspecified: Secondary | ICD-10-CM | POA: Diagnosis not present

## 2021-12-03 DIAGNOSIS — R5381 Other malaise: Secondary | ICD-10-CM | POA: Diagnosis not present

## 2021-12-05 DIAGNOSIS — E1151 Type 2 diabetes mellitus with diabetic peripheral angiopathy without gangrene: Secondary | ICD-10-CM | POA: Diagnosis not present

## 2021-12-05 DIAGNOSIS — L97422 Non-pressure chronic ulcer of left heel and midfoot with fat layer exposed: Secondary | ICD-10-CM | POA: Diagnosis not present

## 2021-12-05 DIAGNOSIS — L97822 Non-pressure chronic ulcer of other part of left lower leg with fat layer exposed: Secondary | ICD-10-CM | POA: Diagnosis not present

## 2021-12-05 DIAGNOSIS — E11621 Type 2 diabetes mellitus with foot ulcer: Secondary | ICD-10-CM | POA: Diagnosis not present

## 2021-12-05 DIAGNOSIS — L97522 Non-pressure chronic ulcer of other part of left foot with fat layer exposed: Secondary | ICD-10-CM | POA: Diagnosis not present

## 2021-12-05 DIAGNOSIS — I890 Lymphedema, not elsewhere classified: Secondary | ICD-10-CM | POA: Diagnosis not present

## 2021-12-05 DIAGNOSIS — S71112A Laceration without foreign body, left thigh, initial encounter: Secondary | ICD-10-CM | POA: Diagnosis not present

## 2021-12-09 DIAGNOSIS — E11621 Type 2 diabetes mellitus with foot ulcer: Secondary | ICD-10-CM | POA: Diagnosis not present

## 2021-12-09 DIAGNOSIS — E782 Mixed hyperlipidemia: Secondary | ICD-10-CM | POA: Diagnosis not present

## 2021-12-10 DIAGNOSIS — M25561 Pain in right knee: Secondary | ICD-10-CM | POA: Diagnosis not present

## 2021-12-10 DIAGNOSIS — L97422 Non-pressure chronic ulcer of left heel and midfoot with fat layer exposed: Secondary | ICD-10-CM | POA: Diagnosis not present

## 2021-12-10 DIAGNOSIS — S91312A Laceration without foreign body, left foot, initial encounter: Secondary | ICD-10-CM | POA: Diagnosis not present

## 2021-12-10 DIAGNOSIS — M79604 Pain in right leg: Secondary | ICD-10-CM | POA: Diagnosis not present

## 2021-12-10 DIAGNOSIS — M79651 Pain in right thigh: Secondary | ICD-10-CM | POA: Diagnosis not present

## 2021-12-10 DIAGNOSIS — E119 Type 2 diabetes mellitus without complications: Secondary | ICD-10-CM | POA: Diagnosis not present

## 2021-12-10 DIAGNOSIS — I890 Lymphedema, not elsewhere classified: Secondary | ICD-10-CM | POA: Diagnosis not present

## 2021-12-10 DIAGNOSIS — Z7984 Long term (current) use of oral hypoglycemic drugs: Secondary | ICD-10-CM | POA: Diagnosis not present

## 2021-12-10 DIAGNOSIS — M7989 Other specified soft tissue disorders: Secondary | ICD-10-CM | POA: Diagnosis not present

## 2021-12-10 DIAGNOSIS — M79661 Pain in right lower leg: Secondary | ICD-10-CM | POA: Diagnosis not present

## 2021-12-10 DIAGNOSIS — E11621 Type 2 diabetes mellitus with foot ulcer: Secondary | ICD-10-CM | POA: Diagnosis not present

## 2021-12-10 DIAGNOSIS — Z794 Long term (current) use of insulin: Secondary | ICD-10-CM | POA: Diagnosis not present

## 2021-12-10 DIAGNOSIS — Z89412 Acquired absence of left great toe: Secondary | ICD-10-CM | POA: Diagnosis not present

## 2021-12-10 DIAGNOSIS — E1151 Type 2 diabetes mellitus with diabetic peripheral angiopathy without gangrene: Secondary | ICD-10-CM | POA: Diagnosis not present

## 2021-12-10 DIAGNOSIS — L03116 Cellulitis of left lower limb: Secondary | ICD-10-CM | POA: Diagnosis not present

## 2021-12-10 DIAGNOSIS — S71112A Laceration without foreign body, left thigh, initial encounter: Secondary | ICD-10-CM | POA: Diagnosis not present

## 2021-12-10 DIAGNOSIS — L03115 Cellulitis of right lower limb: Secondary | ICD-10-CM | POA: Diagnosis not present

## 2021-12-10 DIAGNOSIS — S91115A Laceration without foreign body of left lesser toe(s) without damage to nail, initial encounter: Secondary | ICD-10-CM | POA: Diagnosis not present

## 2021-12-10 DIAGNOSIS — M1711 Unilateral primary osteoarthritis, right knee: Secondary | ICD-10-CM | POA: Diagnosis not present

## 2021-12-10 DIAGNOSIS — Z7982 Long term (current) use of aspirin: Secondary | ICD-10-CM | POA: Diagnosis not present

## 2021-12-10 DIAGNOSIS — L97522 Non-pressure chronic ulcer of other part of left foot with fat layer exposed: Secondary | ICD-10-CM | POA: Diagnosis not present

## 2021-12-10 DIAGNOSIS — R2241 Localized swelling, mass and lump, right lower limb: Secondary | ICD-10-CM | POA: Diagnosis not present

## 2021-12-12 DIAGNOSIS — L97522 Non-pressure chronic ulcer of other part of left foot with fat layer exposed: Secondary | ICD-10-CM | POA: Diagnosis not present

## 2021-12-12 DIAGNOSIS — F32A Depression, unspecified: Secondary | ICD-10-CM | POA: Diagnosis not present

## 2021-12-12 DIAGNOSIS — F411 Generalized anxiety disorder: Secondary | ICD-10-CM | POA: Diagnosis not present

## 2021-12-12 DIAGNOSIS — F319 Bipolar disorder, unspecified: Secondary | ICD-10-CM | POA: Diagnosis not present

## 2021-12-12 DIAGNOSIS — E11621 Type 2 diabetes mellitus with foot ulcer: Secondary | ICD-10-CM | POA: Diagnosis not present

## 2021-12-16 DIAGNOSIS — L97522 Non-pressure chronic ulcer of other part of left foot with fat layer exposed: Secondary | ICD-10-CM | POA: Diagnosis not present

## 2021-12-19 DIAGNOSIS — L97522 Non-pressure chronic ulcer of other part of left foot with fat layer exposed: Secondary | ICD-10-CM | POA: Diagnosis not present

## 2021-12-19 DIAGNOSIS — S91302A Unspecified open wound, left foot, initial encounter: Secondary | ICD-10-CM | POA: Diagnosis not present

## 2021-12-19 DIAGNOSIS — E11621 Type 2 diabetes mellitus with foot ulcer: Secondary | ICD-10-CM | POA: Diagnosis not present

## 2021-12-24 DIAGNOSIS — N390 Urinary tract infection, site not specified: Secondary | ICD-10-CM | POA: Diagnosis not present

## 2021-12-24 DIAGNOSIS — R2231 Localized swelling, mass and lump, right upper limb: Secondary | ICD-10-CM | POA: Diagnosis not present

## 2021-12-24 DIAGNOSIS — E11621 Type 2 diabetes mellitus with foot ulcer: Secondary | ICD-10-CM | POA: Diagnosis not present

## 2021-12-26 DIAGNOSIS — L97522 Non-pressure chronic ulcer of other part of left foot with fat layer exposed: Secondary | ICD-10-CM | POA: Diagnosis not present

## 2021-12-26 DIAGNOSIS — E11621 Type 2 diabetes mellitus with foot ulcer: Secondary | ICD-10-CM | POA: Diagnosis not present

## 2021-12-27 DIAGNOSIS — E782 Mixed hyperlipidemia: Secondary | ICD-10-CM | POA: Diagnosis not present

## 2021-12-27 DIAGNOSIS — R4181 Age-related cognitive decline: Secondary | ICD-10-CM | POA: Diagnosis not present

## 2021-12-30 DIAGNOSIS — F331 Major depressive disorder, recurrent, moderate: Secondary | ICD-10-CM | POA: Diagnosis not present

## 2021-12-30 DIAGNOSIS — E113519 Type 2 diabetes mellitus with proliferative diabetic retinopathy with macular edema, unspecified eye: Secondary | ICD-10-CM | POA: Diagnosis not present

## 2021-12-30 DIAGNOSIS — L97529 Non-pressure chronic ulcer of other part of left foot with unspecified severity: Secondary | ICD-10-CM | POA: Diagnosis not present

## 2021-12-30 DIAGNOSIS — E11621 Type 2 diabetes mellitus with foot ulcer: Secondary | ICD-10-CM | POA: Diagnosis not present

## 2021-12-30 DIAGNOSIS — Z89511 Acquired absence of right leg below knee: Secondary | ICD-10-CM | POA: Diagnosis not present

## 2021-12-30 DIAGNOSIS — N1832 Chronic kidney disease, stage 3b: Secondary | ICD-10-CM | POA: Diagnosis not present

## 2021-12-30 DIAGNOSIS — Z6841 Body Mass Index (BMI) 40.0 and over, adult: Secondary | ICD-10-CM | POA: Diagnosis not present

## 2021-12-30 DIAGNOSIS — E1122 Type 2 diabetes mellitus with diabetic chronic kidney disease: Secondary | ICD-10-CM | POA: Diagnosis not present

## 2022-01-03 DIAGNOSIS — M25561 Pain in right knee: Secondary | ICD-10-CM | POA: Diagnosis not present

## 2022-01-03 DIAGNOSIS — M79604 Pain in right leg: Secondary | ICD-10-CM | POA: Diagnosis not present

## 2022-01-03 DIAGNOSIS — M7989 Other specified soft tissue disorders: Secondary | ICD-10-CM | POA: Diagnosis not present

## 2022-01-03 DIAGNOSIS — E11621 Type 2 diabetes mellitus with foot ulcer: Secondary | ICD-10-CM | POA: Diagnosis not present

## 2022-01-03 DIAGNOSIS — L03115 Cellulitis of right lower limb: Secondary | ICD-10-CM | POA: Diagnosis not present

## 2022-01-03 DIAGNOSIS — L97522 Non-pressure chronic ulcer of other part of left foot with fat layer exposed: Secondary | ICD-10-CM | POA: Diagnosis not present

## 2022-01-03 DIAGNOSIS — R2241 Localized swelling, mass and lump, right lower limb: Secondary | ICD-10-CM | POA: Diagnosis not present

## 2022-01-03 DIAGNOSIS — M79661 Pain in right lower leg: Secondary | ICD-10-CM | POA: Diagnosis not present

## 2022-01-03 DIAGNOSIS — L97521 Non-pressure chronic ulcer of other part of left foot limited to breakdown of skin: Secondary | ICD-10-CM | POA: Diagnosis not present

## 2022-01-06 DIAGNOSIS — Z91040 Latex allergy status: Secondary | ICD-10-CM | POA: Diagnosis not present

## 2022-01-06 DIAGNOSIS — Z79899 Other long term (current) drug therapy: Secondary | ICD-10-CM | POA: Diagnosis not present

## 2022-01-06 DIAGNOSIS — R410 Disorientation, unspecified: Secondary | ICD-10-CM | POA: Diagnosis not present

## 2022-01-06 DIAGNOSIS — Z794 Long term (current) use of insulin: Secondary | ICD-10-CM | POA: Diagnosis not present

## 2022-01-06 DIAGNOSIS — Z7982 Long term (current) use of aspirin: Secondary | ICD-10-CM | POA: Diagnosis not present

## 2022-01-06 DIAGNOSIS — Z888 Allergy status to other drugs, medicaments and biological substances status: Secondary | ICD-10-CM | POA: Diagnosis not present

## 2022-01-06 DIAGNOSIS — Z87891 Personal history of nicotine dependence: Secondary | ICD-10-CM | POA: Diagnosis not present

## 2022-01-06 DIAGNOSIS — Z885 Allergy status to narcotic agent status: Secondary | ICD-10-CM | POA: Diagnosis not present

## 2022-01-06 DIAGNOSIS — R4182 Altered mental status, unspecified: Secondary | ICD-10-CM | POA: Diagnosis not present

## 2022-01-06 DIAGNOSIS — I440 Atrioventricular block, first degree: Secondary | ICD-10-CM | POA: Diagnosis not present

## 2022-01-07 DIAGNOSIS — E878 Other disorders of electrolyte and fluid balance, not elsewhere classified: Secondary | ICD-10-CM | POA: Diagnosis not present

## 2022-01-07 DIAGNOSIS — R296 Repeated falls: Secondary | ICD-10-CM | POA: Diagnosis not present

## 2022-01-07 DIAGNOSIS — N390 Urinary tract infection, site not specified: Secondary | ICD-10-CM | POA: Diagnosis not present

## 2022-01-09 DIAGNOSIS — E11621 Type 2 diabetes mellitus with foot ulcer: Secondary | ICD-10-CM | POA: Diagnosis not present

## 2022-01-09 DIAGNOSIS — L97522 Non-pressure chronic ulcer of other part of left foot with fat layer exposed: Secondary | ICD-10-CM | POA: Diagnosis not present

## 2022-01-14 DIAGNOSIS — E785 Hyperlipidemia, unspecified: Secondary | ICD-10-CM | POA: Diagnosis not present

## 2022-01-14 DIAGNOSIS — K219 Gastro-esophageal reflux disease without esophagitis: Secondary | ICD-10-CM | POA: Diagnosis not present

## 2022-01-14 DIAGNOSIS — B356 Tinea cruris: Secondary | ICD-10-CM | POA: Diagnosis not present

## 2022-01-14 DIAGNOSIS — E119 Type 2 diabetes mellitus without complications: Secondary | ICD-10-CM | POA: Diagnosis not present

## 2022-01-14 DIAGNOSIS — Z794 Long term (current) use of insulin: Secondary | ICD-10-CM | POA: Diagnosis not present

## 2022-01-14 DIAGNOSIS — I959 Hypotension, unspecified: Secondary | ICD-10-CM | POA: Diagnosis not present

## 2022-01-14 DIAGNOSIS — R5381 Other malaise: Secondary | ICD-10-CM | POA: Diagnosis not present

## 2022-01-14 DIAGNOSIS — Z7982 Long term (current) use of aspirin: Secondary | ICD-10-CM | POA: Diagnosis not present

## 2022-01-14 DIAGNOSIS — Z7401 Bed confinement status: Secondary | ICD-10-CM | POA: Diagnosis not present

## 2022-01-14 DIAGNOSIS — Z89511 Acquired absence of right leg below knee: Secondary | ICD-10-CM | POA: Diagnosis not present

## 2022-01-14 DIAGNOSIS — I10 Essential (primary) hypertension: Secondary | ICD-10-CM | POA: Diagnosis not present

## 2022-01-14 DIAGNOSIS — Z79899 Other long term (current) drug therapy: Secondary | ICD-10-CM | POA: Diagnosis not present

## 2022-01-14 DIAGNOSIS — Z7984 Long term (current) use of oral hypoglycemic drugs: Secondary | ICD-10-CM | POA: Diagnosis not present

## 2022-01-14 DIAGNOSIS — B3789 Other sites of candidiasis: Secondary | ICD-10-CM | POA: Diagnosis not present

## 2022-01-14 DIAGNOSIS — R1031 Right lower quadrant pain: Secondary | ICD-10-CM | POA: Diagnosis not present

## 2022-01-16 DIAGNOSIS — E11621 Type 2 diabetes mellitus with foot ulcer: Secondary | ICD-10-CM | POA: Diagnosis not present

## 2022-01-16 DIAGNOSIS — L97422 Non-pressure chronic ulcer of left heel and midfoot with fat layer exposed: Secondary | ICD-10-CM | POA: Diagnosis not present

## 2022-01-16 DIAGNOSIS — L97522 Non-pressure chronic ulcer of other part of left foot with fat layer exposed: Secondary | ICD-10-CM | POA: Diagnosis not present

## 2022-01-16 DIAGNOSIS — S91205A Unspecified open wound of left lesser toe(s) with damage to nail, initial encounter: Secondary | ICD-10-CM | POA: Diagnosis not present

## 2022-01-16 DIAGNOSIS — S71112A Laceration without foreign body, left thigh, initial encounter: Secondary | ICD-10-CM | POA: Diagnosis not present

## 2022-01-16 DIAGNOSIS — L03116 Cellulitis of left lower limb: Secondary | ICD-10-CM | POA: Diagnosis not present

## 2022-01-16 DIAGNOSIS — I890 Lymphedema, not elsewhere classified: Secondary | ICD-10-CM | POA: Diagnosis not present

## 2022-01-16 DIAGNOSIS — E1151 Type 2 diabetes mellitus with diabetic peripheral angiopathy without gangrene: Secondary | ICD-10-CM | POA: Diagnosis not present

## 2022-01-16 DIAGNOSIS — S91115A Laceration without foreign body of left lesser toe(s) without damage to nail, initial encounter: Secondary | ICD-10-CM | POA: Diagnosis not present

## 2022-01-16 DIAGNOSIS — Z89412 Acquired absence of left great toe: Secondary | ICD-10-CM | POA: Diagnosis not present

## 2022-01-16 DIAGNOSIS — E11649 Type 2 diabetes mellitus with hypoglycemia without coma: Secondary | ICD-10-CM | POA: Diagnosis not present

## 2022-01-16 DIAGNOSIS — F411 Generalized anxiety disorder: Secondary | ICD-10-CM | POA: Diagnosis not present

## 2022-01-16 DIAGNOSIS — F319 Bipolar disorder, unspecified: Secondary | ICD-10-CM | POA: Diagnosis not present

## 2022-01-16 DIAGNOSIS — S91312A Laceration without foreign body, left foot, initial encounter: Secondary | ICD-10-CM | POA: Diagnosis not present

## 2022-01-20 DIAGNOSIS — E11622 Type 2 diabetes mellitus with other skin ulcer: Secondary | ICD-10-CM | POA: Diagnosis not present

## 2022-01-21 DIAGNOSIS — R4181 Age-related cognitive decline: Secondary | ICD-10-CM | POA: Diagnosis not present

## 2022-01-21 DIAGNOSIS — R296 Repeated falls: Secondary | ICD-10-CM | POA: Diagnosis not present

## 2022-01-23 DIAGNOSIS — E11621 Type 2 diabetes mellitus with foot ulcer: Secondary | ICD-10-CM | POA: Diagnosis not present

## 2022-01-23 DIAGNOSIS — L03116 Cellulitis of left lower limb: Secondary | ICD-10-CM | POA: Diagnosis not present

## 2022-01-23 DIAGNOSIS — L97522 Non-pressure chronic ulcer of other part of left foot with fat layer exposed: Secondary | ICD-10-CM | POA: Diagnosis not present

## 2022-01-28 DIAGNOSIS — E11621 Type 2 diabetes mellitus with foot ulcer: Secondary | ICD-10-CM | POA: Diagnosis not present

## 2022-01-28 DIAGNOSIS — L97522 Non-pressure chronic ulcer of other part of left foot with fat layer exposed: Secondary | ICD-10-CM | POA: Diagnosis not present

## 2022-01-31 DIAGNOSIS — R4181 Age-related cognitive decline: Secondary | ICD-10-CM | POA: Diagnosis not present

## 2022-01-31 DIAGNOSIS — E782 Mixed hyperlipidemia: Secondary | ICD-10-CM | POA: Diagnosis not present

## 2022-02-03 DIAGNOSIS — Z79899 Other long term (current) drug therapy: Secondary | ICD-10-CM | POA: Diagnosis not present

## 2022-02-03 DIAGNOSIS — Z881 Allergy status to other antibiotic agents status: Secondary | ICD-10-CM | POA: Diagnosis not present

## 2022-02-03 DIAGNOSIS — E119 Type 2 diabetes mellitus without complications: Secondary | ICD-10-CM | POA: Diagnosis not present

## 2022-02-03 DIAGNOSIS — Z888 Allergy status to other drugs, medicaments and biological substances status: Secondary | ICD-10-CM | POA: Diagnosis not present

## 2022-02-03 DIAGNOSIS — Z885 Allergy status to narcotic agent status: Secondary | ICD-10-CM | POA: Diagnosis not present

## 2022-02-03 DIAGNOSIS — Z89511 Acquired absence of right leg below knee: Secondary | ICD-10-CM | POA: Diagnosis not present

## 2022-02-03 DIAGNOSIS — Z91040 Latex allergy status: Secondary | ICD-10-CM | POA: Diagnosis not present

## 2022-02-03 DIAGNOSIS — E785 Hyperlipidemia, unspecified: Secondary | ICD-10-CM | POA: Diagnosis not present

## 2022-02-03 DIAGNOSIS — Z794 Long term (current) use of insulin: Secondary | ICD-10-CM | POA: Diagnosis not present

## 2022-02-03 DIAGNOSIS — R296 Repeated falls: Secondary | ICD-10-CM | POA: Diagnosis not present

## 2022-02-04 DIAGNOSIS — N1832 Chronic kidney disease, stage 3b: Secondary | ICD-10-CM | POA: Diagnosis not present

## 2022-02-04 DIAGNOSIS — R296 Repeated falls: Secondary | ICD-10-CM | POA: Diagnosis not present

## 2022-02-04 DIAGNOSIS — Z794 Long term (current) use of insulin: Secondary | ICD-10-CM | POA: Diagnosis not present

## 2022-02-04 DIAGNOSIS — E119 Type 2 diabetes mellitus without complications: Secondary | ICD-10-CM | POA: Diagnosis not present

## 2022-02-04 DIAGNOSIS — W19XXXA Unspecified fall, initial encounter: Secondary | ICD-10-CM | POA: Diagnosis not present

## 2022-02-04 DIAGNOSIS — Z6838 Body mass index (BMI) 38.0-38.9, adult: Secondary | ICD-10-CM | POA: Diagnosis not present

## 2022-02-06 DIAGNOSIS — L97522 Non-pressure chronic ulcer of other part of left foot with fat layer exposed: Secondary | ICD-10-CM | POA: Diagnosis not present

## 2022-02-06 DIAGNOSIS — E11621 Type 2 diabetes mellitus with foot ulcer: Secondary | ICD-10-CM | POA: Diagnosis not present

## 2022-02-13 DIAGNOSIS — G40909 Epilepsy, unspecified, not intractable, without status epilepticus: Secondary | ICD-10-CM | POA: Diagnosis not present

## 2022-02-13 DIAGNOSIS — E1169 Type 2 diabetes mellitus with other specified complication: Secondary | ICD-10-CM | POA: Diagnosis not present

## 2022-02-13 DIAGNOSIS — N189 Chronic kidney disease, unspecified: Secondary | ICD-10-CM | POA: Diagnosis not present

## 2022-02-13 DIAGNOSIS — L97529 Non-pressure chronic ulcer of other part of left foot with unspecified severity: Secondary | ICD-10-CM | POA: Diagnosis not present

## 2022-02-13 DIAGNOSIS — E11621 Type 2 diabetes mellitus with foot ulcer: Secondary | ICD-10-CM | POA: Diagnosis not present

## 2022-02-13 DIAGNOSIS — E1152 Type 2 diabetes mellitus with diabetic peripheral angiopathy with gangrene: Secondary | ICD-10-CM | POA: Diagnosis not present

## 2022-02-13 DIAGNOSIS — D649 Anemia, unspecified: Secondary | ICD-10-CM | POA: Diagnosis not present

## 2022-02-13 DIAGNOSIS — F319 Bipolar disorder, unspecified: Secondary | ICD-10-CM | POA: Diagnosis not present

## 2022-02-13 DIAGNOSIS — E1122 Type 2 diabetes mellitus with diabetic chronic kidney disease: Secondary | ICD-10-CM | POA: Diagnosis not present

## 2022-02-13 DIAGNOSIS — Z6841 Body Mass Index (BMI) 40.0 and over, adult: Secondary | ICD-10-CM | POA: Diagnosis not present

## 2022-02-13 DIAGNOSIS — S93125A Dislocation of metatarsophalangeal joint of left lesser toe(s), initial encounter: Secondary | ICD-10-CM | POA: Diagnosis not present

## 2022-02-13 DIAGNOSIS — S90812A Abrasion, left foot, initial encounter: Secondary | ICD-10-CM | POA: Diagnosis not present

## 2022-02-13 DIAGNOSIS — Z794 Long term (current) use of insulin: Secondary | ICD-10-CM | POA: Diagnosis not present

## 2022-02-13 DIAGNOSIS — I10 Essential (primary) hypertension: Secondary | ICD-10-CM | POA: Diagnosis not present

## 2022-02-13 DIAGNOSIS — F419 Anxiety disorder, unspecified: Secondary | ICD-10-CM | POA: Diagnosis not present

## 2022-02-13 DIAGNOSIS — Z885 Allergy status to narcotic agent status: Secondary | ICD-10-CM | POA: Diagnosis not present

## 2022-02-13 DIAGNOSIS — Z89412 Acquired absence of left great toe: Secondary | ICD-10-CM | POA: Diagnosis not present

## 2022-02-13 DIAGNOSIS — Z89511 Acquired absence of right leg below knee: Secondary | ICD-10-CM | POA: Diagnosis not present

## 2022-02-13 DIAGNOSIS — I129 Hypertensive chronic kidney disease with stage 1 through stage 4 chronic kidney disease, or unspecified chronic kidney disease: Secondary | ICD-10-CM | POA: Diagnosis not present

## 2022-02-13 DIAGNOSIS — Z79899 Other long term (current) drug therapy: Secondary | ICD-10-CM | POA: Diagnosis not present

## 2022-02-13 DIAGNOSIS — M79672 Pain in left foot: Secondary | ICD-10-CM | POA: Diagnosis not present

## 2022-02-13 DIAGNOSIS — I959 Hypotension, unspecified: Secondary | ICD-10-CM | POA: Diagnosis not present

## 2022-02-13 DIAGNOSIS — E785 Hyperlipidemia, unspecified: Secondary | ICD-10-CM | POA: Diagnosis not present

## 2022-02-13 DIAGNOSIS — S91109D Unspecified open wound of unspecified toe(s) without damage to nail, subsequent encounter: Secondary | ICD-10-CM | POA: Diagnosis not present

## 2022-02-13 DIAGNOSIS — Z993 Dependence on wheelchair: Secondary | ICD-10-CM | POA: Diagnosis not present

## 2022-02-13 DIAGNOSIS — G4733 Obstructive sleep apnea (adult) (pediatric): Secondary | ICD-10-CM | POA: Diagnosis not present

## 2022-02-13 DIAGNOSIS — M25562 Pain in left knee: Secondary | ICD-10-CM | POA: Diagnosis not present

## 2022-02-13 DIAGNOSIS — F411 Generalized anxiety disorder: Secondary | ICD-10-CM | POA: Diagnosis not present

## 2022-02-13 DIAGNOSIS — Z7984 Long term (current) use of oral hypoglycemic drugs: Secondary | ICD-10-CM | POA: Diagnosis not present

## 2022-02-13 DIAGNOSIS — R609 Edema, unspecified: Secondary | ICD-10-CM | POA: Diagnosis not present

## 2022-02-13 DIAGNOSIS — E11628 Type 2 diabetes mellitus with other skin complications: Secondary | ICD-10-CM | POA: Diagnosis not present

## 2022-02-13 DIAGNOSIS — L03116 Cellulitis of left lower limb: Secondary | ICD-10-CM | POA: Diagnosis not present

## 2022-02-13 DIAGNOSIS — I96 Gangrene, not elsewhere classified: Secondary | ICD-10-CM | POA: Diagnosis not present

## 2022-02-14 DIAGNOSIS — R4182 Altered mental status, unspecified: Secondary | ICD-10-CM | POA: Diagnosis not present

## 2022-02-14 DIAGNOSIS — L03032 Cellulitis of left toe: Secondary | ICD-10-CM | POA: Diagnosis not present

## 2022-02-14 DIAGNOSIS — Z794 Long term (current) use of insulin: Secondary | ICD-10-CM | POA: Diagnosis not present

## 2022-02-14 DIAGNOSIS — E11621 Type 2 diabetes mellitus with foot ulcer: Secondary | ICD-10-CM | POA: Diagnosis not present

## 2022-02-14 DIAGNOSIS — L03116 Cellulitis of left lower limb: Secondary | ICD-10-CM | POA: Diagnosis not present

## 2022-02-14 DIAGNOSIS — S91109D Unspecified open wound of unspecified toe(s) without damage to nail, subsequent encounter: Secondary | ICD-10-CM | POA: Diagnosis not present

## 2022-02-14 DIAGNOSIS — E11628 Type 2 diabetes mellitus with other skin complications: Secondary | ICD-10-CM | POA: Diagnosis not present

## 2022-02-14 DIAGNOSIS — L97528 Non-pressure chronic ulcer of other part of left foot with other specified severity: Secondary | ICD-10-CM | POA: Diagnosis not present

## 2022-02-14 DIAGNOSIS — R2231 Localized swelling, mass and lump, right upper limb: Secondary | ICD-10-CM | POA: Diagnosis not present

## 2022-02-14 DIAGNOSIS — R296 Repeated falls: Secondary | ICD-10-CM | POA: Diagnosis not present

## 2022-02-15 DIAGNOSIS — L97528 Non-pressure chronic ulcer of other part of left foot with other specified severity: Secondary | ICD-10-CM | POA: Diagnosis not present

## 2022-02-15 DIAGNOSIS — L03032 Cellulitis of left toe: Secondary | ICD-10-CM | POA: Diagnosis not present

## 2022-02-15 DIAGNOSIS — E1122 Type 2 diabetes mellitus with diabetic chronic kidney disease: Secondary | ICD-10-CM | POA: Diagnosis not present

## 2022-02-15 DIAGNOSIS — S91102A Unspecified open wound of left great toe without damage to nail, initial encounter: Secondary | ICD-10-CM | POA: Diagnosis not present

## 2022-02-15 DIAGNOSIS — I129 Hypertensive chronic kidney disease with stage 1 through stage 4 chronic kidney disease, or unspecified chronic kidney disease: Secondary | ICD-10-CM | POA: Diagnosis not present

## 2022-02-15 DIAGNOSIS — D649 Anemia, unspecified: Secondary | ICD-10-CM | POA: Diagnosis not present

## 2022-02-15 DIAGNOSIS — S91109D Unspecified open wound of unspecified toe(s) without damage to nail, subsequent encounter: Secondary | ICD-10-CM | POA: Diagnosis not present

## 2022-02-15 DIAGNOSIS — F319 Bipolar disorder, unspecified: Secondary | ICD-10-CM | POA: Diagnosis not present

## 2022-02-15 DIAGNOSIS — I96 Gangrene, not elsewhere classified: Secondary | ICD-10-CM | POA: Diagnosis not present

## 2022-02-15 DIAGNOSIS — S93125A Dislocation of metatarsophalangeal joint of left lesser toe(s), initial encounter: Secondary | ICD-10-CM | POA: Diagnosis not present

## 2022-02-15 DIAGNOSIS — L03116 Cellulitis of left lower limb: Secondary | ICD-10-CM | POA: Diagnosis not present

## 2022-02-15 DIAGNOSIS — Z794 Long term (current) use of insulin: Secondary | ICD-10-CM | POA: Diagnosis not present

## 2022-02-15 DIAGNOSIS — M869 Osteomyelitis, unspecified: Secondary | ICD-10-CM | POA: Diagnosis not present

## 2022-02-15 DIAGNOSIS — N189 Chronic kidney disease, unspecified: Secondary | ICD-10-CM | POA: Diagnosis not present

## 2022-02-15 DIAGNOSIS — E11628 Type 2 diabetes mellitus with other skin complications: Secondary | ICD-10-CM | POA: Diagnosis not present

## 2022-02-15 DIAGNOSIS — E1152 Type 2 diabetes mellitus with diabetic peripheral angiopathy with gangrene: Secondary | ICD-10-CM | POA: Diagnosis not present

## 2022-02-15 DIAGNOSIS — F419 Anxiety disorder, unspecified: Secondary | ICD-10-CM | POA: Diagnosis not present

## 2022-02-15 DIAGNOSIS — E785 Hyperlipidemia, unspecified: Secondary | ICD-10-CM | POA: Diagnosis not present

## 2022-02-15 DIAGNOSIS — L97524 Non-pressure chronic ulcer of other part of left foot with necrosis of bone: Secondary | ICD-10-CM | POA: Diagnosis not present

## 2022-02-15 DIAGNOSIS — L97529 Non-pressure chronic ulcer of other part of left foot with unspecified severity: Secondary | ICD-10-CM | POA: Diagnosis not present

## 2022-02-16 DIAGNOSIS — E11628 Type 2 diabetes mellitus with other skin complications: Secondary | ICD-10-CM | POA: Diagnosis not present

## 2022-02-16 DIAGNOSIS — L03116 Cellulitis of left lower limb: Secondary | ICD-10-CM | POA: Diagnosis not present

## 2022-02-16 DIAGNOSIS — S91109D Unspecified open wound of unspecified toe(s) without damage to nail, subsequent encounter: Secondary | ICD-10-CM | POA: Diagnosis not present

## 2022-02-16 DIAGNOSIS — Z794 Long term (current) use of insulin: Secondary | ICD-10-CM | POA: Diagnosis not present

## 2022-02-17 DIAGNOSIS — S91109D Unspecified open wound of unspecified toe(s) without damage to nail, subsequent encounter: Secondary | ICD-10-CM | POA: Diagnosis not present

## 2022-02-17 DIAGNOSIS — Z794 Long term (current) use of insulin: Secondary | ICD-10-CM | POA: Diagnosis not present

## 2022-02-17 DIAGNOSIS — E11628 Type 2 diabetes mellitus with other skin complications: Secondary | ICD-10-CM | POA: Diagnosis not present

## 2022-02-17 DIAGNOSIS — L03116 Cellulitis of left lower limb: Secondary | ICD-10-CM | POA: Diagnosis not present

## 2022-02-18 DIAGNOSIS — S91109D Unspecified open wound of unspecified toe(s) without damage to nail, subsequent encounter: Secondary | ICD-10-CM | POA: Diagnosis not present

## 2022-02-18 DIAGNOSIS — L97528 Non-pressure chronic ulcer of other part of left foot with other specified severity: Secondary | ICD-10-CM | POA: Diagnosis not present

## 2022-02-18 DIAGNOSIS — L03116 Cellulitis of left lower limb: Secondary | ICD-10-CM | POA: Diagnosis not present

## 2022-02-18 DIAGNOSIS — E11628 Type 2 diabetes mellitus with other skin complications: Secondary | ICD-10-CM | POA: Diagnosis not present

## 2022-02-18 DIAGNOSIS — Z794 Long term (current) use of insulin: Secondary | ICD-10-CM | POA: Diagnosis not present

## 2022-02-19 DIAGNOSIS — E11628 Type 2 diabetes mellitus with other skin complications: Secondary | ICD-10-CM | POA: Diagnosis not present

## 2022-02-19 DIAGNOSIS — Z794 Long term (current) use of insulin: Secondary | ICD-10-CM | POA: Diagnosis not present

## 2022-02-19 DIAGNOSIS — S91109D Unspecified open wound of unspecified toe(s) without damage to nail, subsequent encounter: Secondary | ICD-10-CM | POA: Diagnosis not present

## 2022-02-19 DIAGNOSIS — L03032 Cellulitis of left toe: Secondary | ICD-10-CM | POA: Diagnosis not present

## 2022-02-19 DIAGNOSIS — L03116 Cellulitis of left lower limb: Secondary | ICD-10-CM | POA: Diagnosis not present

## 2022-02-20 DIAGNOSIS — R4181 Age-related cognitive decline: Secondary | ICD-10-CM | POA: Diagnosis not present

## 2022-02-20 DIAGNOSIS — L03116 Cellulitis of left lower limb: Secondary | ICD-10-CM | POA: Diagnosis not present

## 2022-02-20 DIAGNOSIS — E782 Mixed hyperlipidemia: Secondary | ICD-10-CM | POA: Diagnosis not present

## 2022-02-20 DIAGNOSIS — E11628 Type 2 diabetes mellitus with other skin complications: Secondary | ICD-10-CM | POA: Diagnosis not present

## 2022-02-20 DIAGNOSIS — S91109D Unspecified open wound of unspecified toe(s) without damage to nail, subsequent encounter: Secondary | ICD-10-CM | POA: Diagnosis not present

## 2022-02-21 DIAGNOSIS — E11628 Type 2 diabetes mellitus with other skin complications: Secondary | ICD-10-CM | POA: Diagnosis not present

## 2022-02-21 DIAGNOSIS — L03116 Cellulitis of left lower limb: Secondary | ICD-10-CM | POA: Diagnosis not present

## 2022-02-21 DIAGNOSIS — S91109D Unspecified open wound of unspecified toe(s) without damage to nail, subsequent encounter: Secondary | ICD-10-CM | POA: Diagnosis not present

## 2022-02-22 DIAGNOSIS — Z794 Long term (current) use of insulin: Secondary | ICD-10-CM | POA: Diagnosis not present

## 2022-02-22 DIAGNOSIS — L03116 Cellulitis of left lower limb: Secondary | ICD-10-CM | POA: Diagnosis not present

## 2022-02-22 DIAGNOSIS — S91109D Unspecified open wound of unspecified toe(s) without damage to nail, subsequent encounter: Secondary | ICD-10-CM | POA: Diagnosis not present

## 2022-02-22 DIAGNOSIS — E11628 Type 2 diabetes mellitus with other skin complications: Secondary | ICD-10-CM | POA: Diagnosis not present

## 2022-02-23 DIAGNOSIS — E11628 Type 2 diabetes mellitus with other skin complications: Secondary | ICD-10-CM | POA: Diagnosis not present

## 2022-02-23 DIAGNOSIS — L03116 Cellulitis of left lower limb: Secondary | ICD-10-CM | POA: Diagnosis not present

## 2022-02-23 DIAGNOSIS — S91109D Unspecified open wound of unspecified toe(s) without damage to nail, subsequent encounter: Secondary | ICD-10-CM | POA: Diagnosis not present

## 2022-02-24 DIAGNOSIS — L03116 Cellulitis of left lower limb: Secondary | ICD-10-CM | POA: Diagnosis not present

## 2022-02-24 DIAGNOSIS — S91109D Unspecified open wound of unspecified toe(s) without damage to nail, subsequent encounter: Secondary | ICD-10-CM | POA: Diagnosis not present

## 2022-02-24 DIAGNOSIS — E11628 Type 2 diabetes mellitus with other skin complications: Secondary | ICD-10-CM | POA: Diagnosis not present

## 2022-02-25 DIAGNOSIS — S91109D Unspecified open wound of unspecified toe(s) without damage to nail, subsequent encounter: Secondary | ICD-10-CM | POA: Diagnosis not present

## 2022-02-25 DIAGNOSIS — L03116 Cellulitis of left lower limb: Secondary | ICD-10-CM | POA: Diagnosis not present

## 2022-02-26 DIAGNOSIS — L03116 Cellulitis of left lower limb: Secondary | ICD-10-CM | POA: Diagnosis not present

## 2022-02-26 DIAGNOSIS — S91109D Unspecified open wound of unspecified toe(s) without damage to nail, subsequent encounter: Secondary | ICD-10-CM | POA: Diagnosis not present

## 2022-02-27 DIAGNOSIS — E11628 Type 2 diabetes mellitus with other skin complications: Secondary | ICD-10-CM | POA: Diagnosis not present

## 2022-02-27 DIAGNOSIS — Z794 Long term (current) use of insulin: Secondary | ICD-10-CM | POA: Diagnosis not present

## 2022-02-27 DIAGNOSIS — S91109D Unspecified open wound of unspecified toe(s) without damage to nail, subsequent encounter: Secondary | ICD-10-CM | POA: Diagnosis not present

## 2022-02-28 DIAGNOSIS — L03116 Cellulitis of left lower limb: Secondary | ICD-10-CM | POA: Diagnosis not present

## 2022-02-28 DIAGNOSIS — S91109D Unspecified open wound of unspecified toe(s) without damage to nail, subsequent encounter: Secondary | ICD-10-CM | POA: Diagnosis not present

## 2022-02-28 DIAGNOSIS — E11628 Type 2 diabetes mellitus with other skin complications: Secondary | ICD-10-CM | POA: Diagnosis not present

## 2022-03-01 DIAGNOSIS — Z794 Long term (current) use of insulin: Secondary | ICD-10-CM | POA: Diagnosis not present

## 2022-03-01 DIAGNOSIS — S91109D Unspecified open wound of unspecified toe(s) without damage to nail, subsequent encounter: Secondary | ICD-10-CM | POA: Diagnosis not present

## 2022-03-01 DIAGNOSIS — L03116 Cellulitis of left lower limb: Secondary | ICD-10-CM | POA: Diagnosis not present

## 2022-03-01 DIAGNOSIS — E11628 Type 2 diabetes mellitus with other skin complications: Secondary | ICD-10-CM | POA: Diagnosis not present

## 2022-03-02 DIAGNOSIS — L03116 Cellulitis of left lower limb: Secondary | ICD-10-CM | POA: Diagnosis not present

## 2022-03-02 DIAGNOSIS — E11628 Type 2 diabetes mellitus with other skin complications: Secondary | ICD-10-CM | POA: Diagnosis not present

## 2022-03-02 DIAGNOSIS — Z794 Long term (current) use of insulin: Secondary | ICD-10-CM | POA: Diagnosis not present

## 2022-03-03 DIAGNOSIS — L03116 Cellulitis of left lower limb: Secondary | ICD-10-CM | POA: Diagnosis not present

## 2022-03-03 DIAGNOSIS — Z794 Long term (current) use of insulin: Secondary | ICD-10-CM | POA: Diagnosis not present

## 2022-03-04 DIAGNOSIS — E11628 Type 2 diabetes mellitus with other skin complications: Secondary | ICD-10-CM | POA: Diagnosis not present

## 2022-03-04 DIAGNOSIS — L03116 Cellulitis of left lower limb: Secondary | ICD-10-CM | POA: Diagnosis not present

## 2022-03-04 DIAGNOSIS — S91109D Unspecified open wound of unspecified toe(s) without damage to nail, subsequent encounter: Secondary | ICD-10-CM | POA: Diagnosis not present

## 2022-03-04 DIAGNOSIS — Z794 Long term (current) use of insulin: Secondary | ICD-10-CM | POA: Diagnosis not present

## 2022-04-25 IMAGING — DX DG FOOT COMPLETE 3+V*L*
3 series · 3 of 3 positions shown · non-contrast
Comparison: Radiograph 04/02/2020

CLINICAL DATA: Diabetic foot infection.  Purulence with black toes

EXAM:
LEFT FOOT - COMPLETE 3+ VIEW

[foot ap]
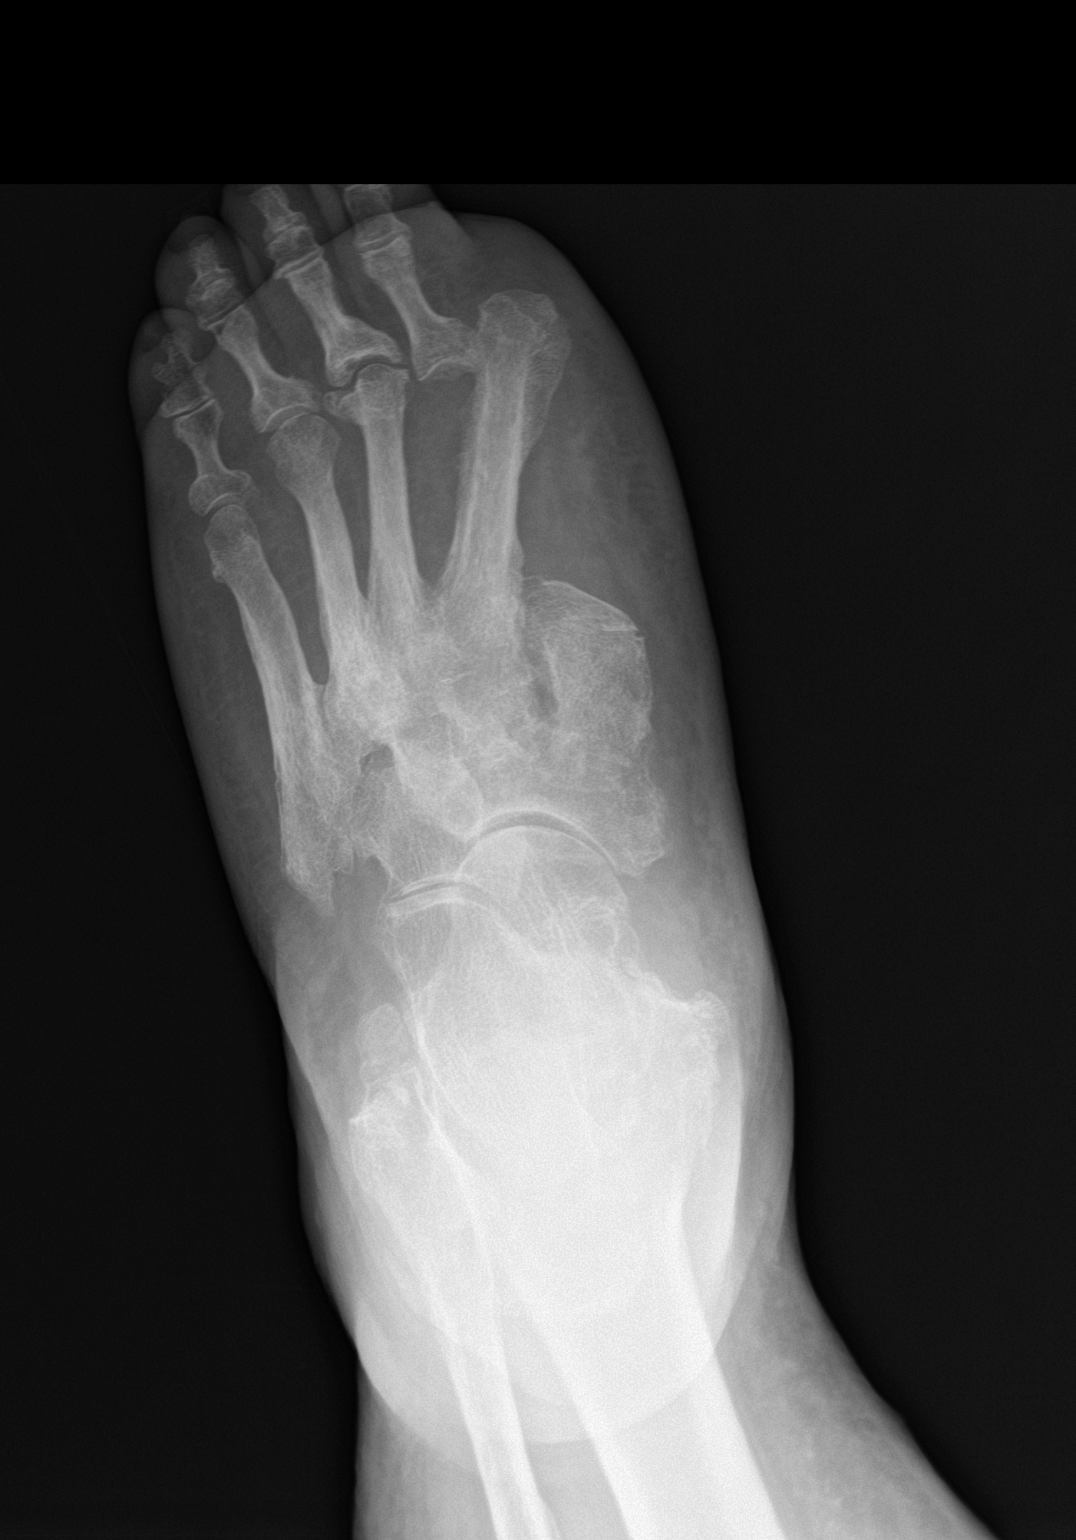

[foot obl]
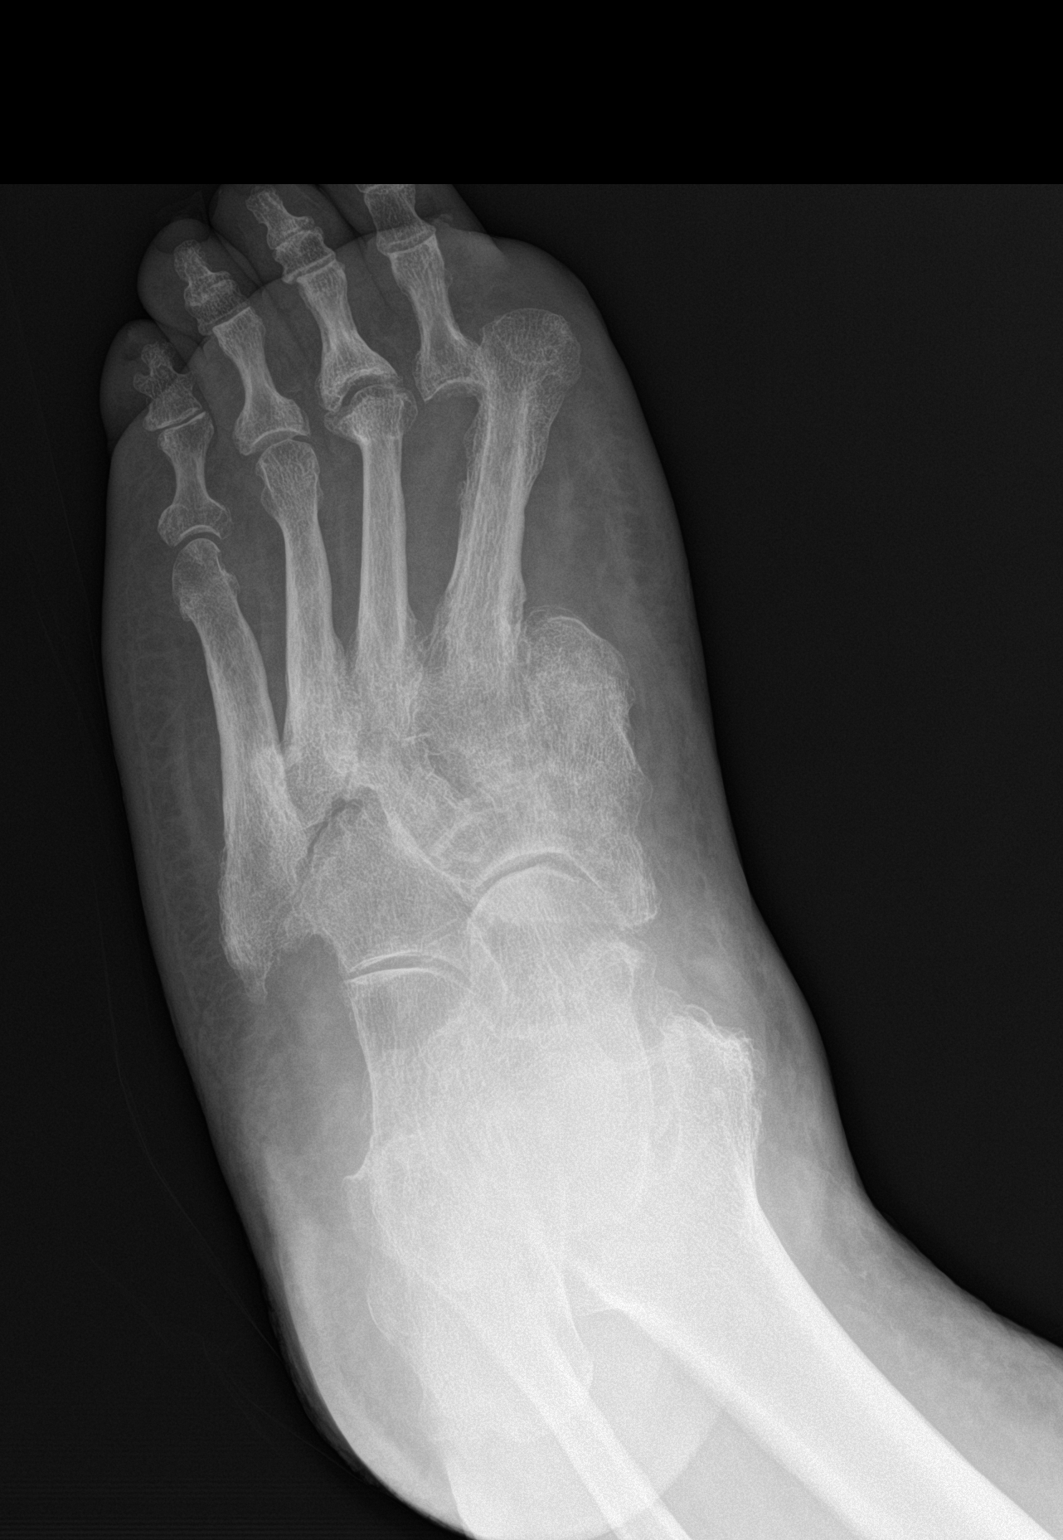

[foot lat]
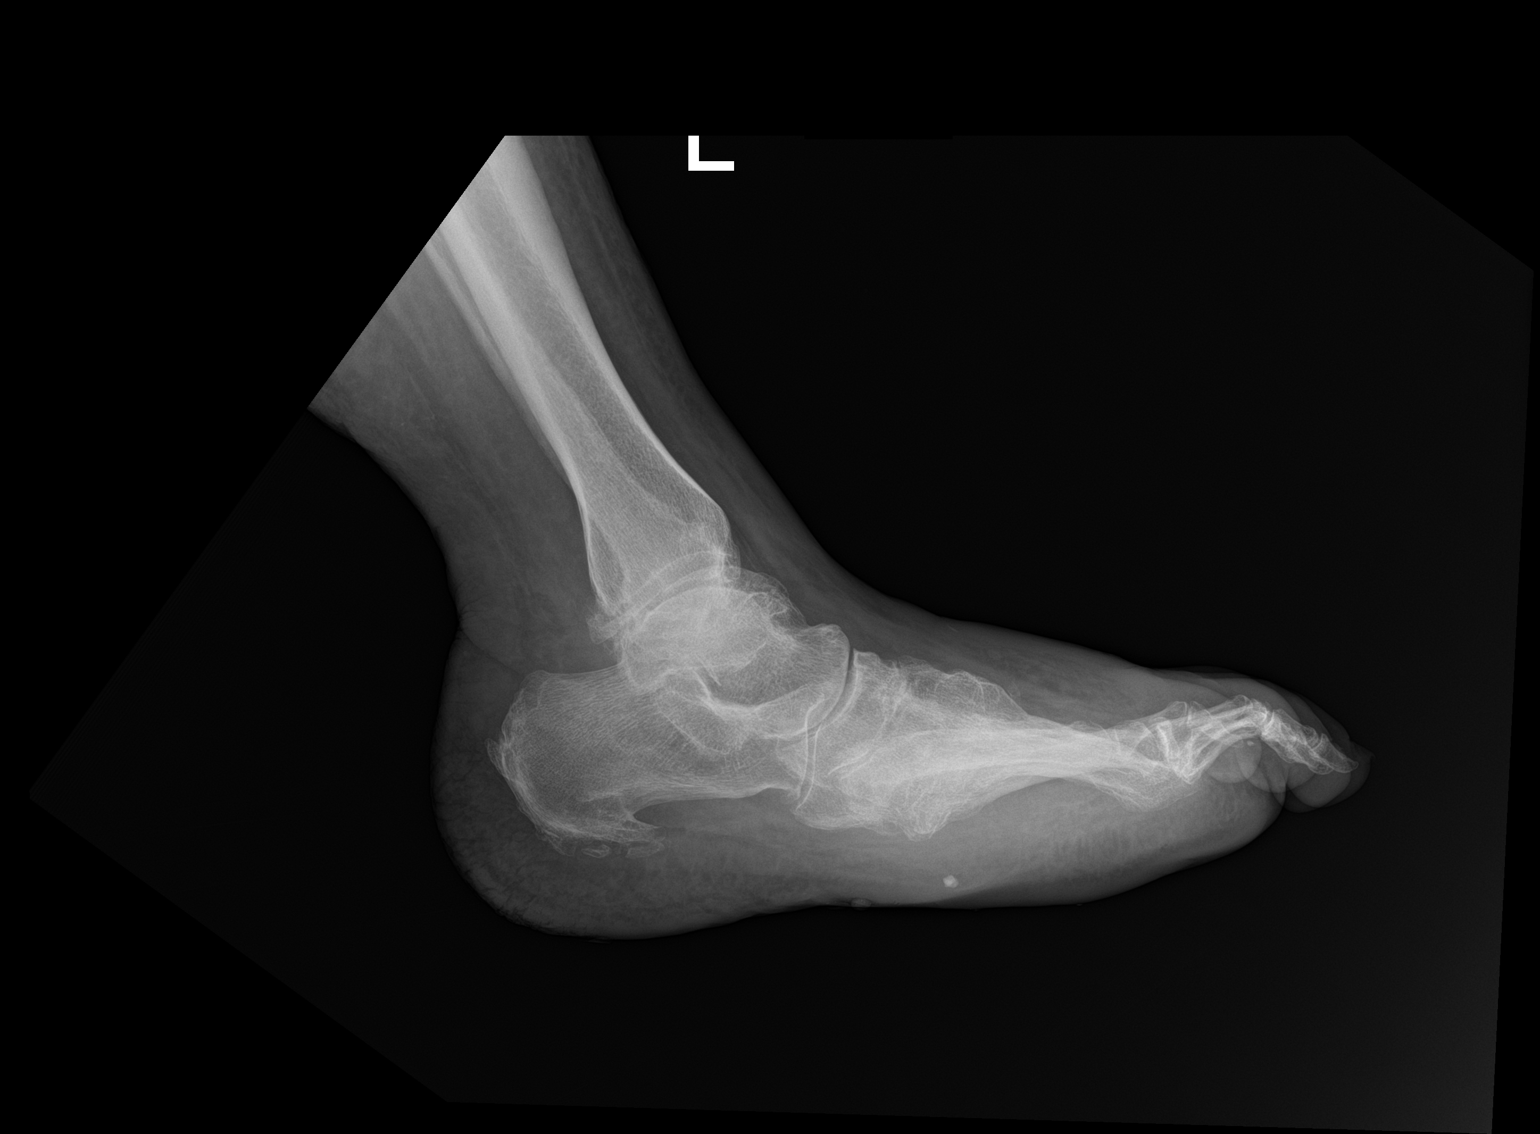

[3 of 3 positions shown; findings below may reference images not displayed]

FINDINGS: Prior resection of the first ray. Chronic dislocation of the second
toe at the metatarsal phalangeal joint. There is bony ankylosis of
the tarsal bones and tarsal metatarsal articulation. Disordered
alignment of the tarsal bones. Partial ankylosis of the proximal
metatarsals. Chronic deformity of the third toe at the metatarsal
phalangeal joint may reflect prior injury. Chronic periosteal
thickening of the metatarsals most prominently involving the second.
No frank bony destruction. Large plantar calcaneal spur with
moderate Achilles tendon enthesophyte. There is generalized soft
tissue edema throughout the foot. Tiny 4 mm density in the plantar
soft tissues was not seen on prior exam may represent foreign body
or calcification. No tracking soft tissue air.
IMPRESSION: 1. Generalized soft tissue edema. No frank bony destruction.
2. Tiny 4 mm density in the plantar soft tissues may represent
foreign body or calcification.
3. Prior resection of the first ray. Multifocal joint ankylosis
consistent with Charcot foot. Chronic dislocation of the second toe.
4. Chronic periosteal thickening of metatarsals most prominently
involving the second which is unchanged from prior exam.

## 2023-02-05 ENCOUNTER — Encounter: Payer: Self-pay | Admitting: Hematology & Oncology

## 2024-05-09 ENCOUNTER — Encounter: Payer: Self-pay | Admitting: Hematology & Oncology

## 2024-05-09 ENCOUNTER — Other Ambulatory Visit (HOSPITAL_BASED_OUTPATIENT_CLINIC_OR_DEPARTMENT_OTHER): Payer: Self-pay | Admitting: Adult Health

## 2024-05-09 DIAGNOSIS — Z1231 Encounter for screening mammogram for malignant neoplasm of breast: Secondary | ICD-10-CM

## 2024-05-10 ENCOUNTER — Encounter: Payer: Self-pay | Admitting: Hematology & Oncology

## 2024-05-10 ENCOUNTER — Encounter (HOSPITAL_BASED_OUTPATIENT_CLINIC_OR_DEPARTMENT_OTHER): Payer: Self-pay

## 2024-05-10 ENCOUNTER — Inpatient Hospital Stay (HOSPITAL_BASED_OUTPATIENT_CLINIC_OR_DEPARTMENT_OTHER): Admission: RE | Admit: 2024-05-10

## 2024-05-23 ENCOUNTER — Ambulatory Visit (HOSPITAL_BASED_OUTPATIENT_CLINIC_OR_DEPARTMENT_OTHER)
Admission: RE | Admit: 2024-05-23 | Discharge: 2024-05-23 | Disposition: A | Discharge status: Home / Self Care | Source: Ambulatory Visit | Attending: Adult Health | Admitting: Adult Health

## 2024-05-23 ENCOUNTER — Encounter (HOSPITAL_BASED_OUTPATIENT_CLINIC_OR_DEPARTMENT_OTHER): Payer: Self-pay

## 2024-05-23 DIAGNOSIS — Z1231 Encounter for screening mammogram for malignant neoplasm of breast: Secondary | ICD-10-CM | POA: Diagnosis present
